# Patient Record
Sex: Male | Born: 1940 | Race: White | Hispanic: No | Marital: Married | Smoking: Former smoker
Health system: Southern US, Community
[De-identification: ages and names within clinical notes are randomized; demographics above are authoritative.]

## PROBLEM LIST (undated history)

## (undated) DIAGNOSIS — Z9989 Dependence on other enabling machines and devices: Secondary | ICD-10-CM

## (undated) DIAGNOSIS — I2510 Atherosclerotic heart disease of native coronary artery without angina pectoris: Secondary | ICD-10-CM

## (undated) DIAGNOSIS — D649 Anemia, unspecified: Secondary | ICD-10-CM

## (undated) DIAGNOSIS — C259 Malignant neoplasm of pancreas, unspecified: Secondary | ICD-10-CM

## (undated) DIAGNOSIS — I25110 Atherosclerotic heart disease of native coronary artery with unstable angina pectoris: Secondary | ICD-10-CM

## (undated) DIAGNOSIS — N400 Enlarged prostate without lower urinary tract symptoms: Secondary | ICD-10-CM

## (undated) DIAGNOSIS — R079 Chest pain, unspecified: Secondary | ICD-10-CM

## (undated) DIAGNOSIS — I509 Heart failure, unspecified: Secondary | ICD-10-CM

## (undated) DIAGNOSIS — I4891 Unspecified atrial fibrillation: Secondary | ICD-10-CM

## (undated) DIAGNOSIS — I10 Essential (primary) hypertension: Secondary | ICD-10-CM

## (undated) DIAGNOSIS — M1990 Unspecified osteoarthritis, unspecified site: Secondary | ICD-10-CM

## (undated) DIAGNOSIS — E785 Hyperlipidemia, unspecified: Secondary | ICD-10-CM

## (undated) DIAGNOSIS — F419 Anxiety disorder, unspecified: Secondary | ICD-10-CM

## (undated) DIAGNOSIS — M069 Rheumatoid arthritis, unspecified: Secondary | ICD-10-CM

## (undated) DIAGNOSIS — Z9289 Personal history of other medical treatment: Secondary | ICD-10-CM

## (undated) DIAGNOSIS — R739 Hyperglycemia, unspecified: Secondary | ICD-10-CM

## (undated) DIAGNOSIS — Z950 Presence of cardiac pacemaker: Secondary | ICD-10-CM

## (undated) DIAGNOSIS — K219 Gastro-esophageal reflux disease without esophagitis: Secondary | ICD-10-CM

## (undated) DIAGNOSIS — F32A Depression, unspecified: Secondary | ICD-10-CM

## (undated) DIAGNOSIS — I442 Atrioventricular block, complete: Secondary | ICD-10-CM

## (undated) DIAGNOSIS — C419 Malignant neoplasm of bone and articular cartilage, unspecified: Secondary | ICD-10-CM

## (undated) DIAGNOSIS — E119 Type 2 diabetes mellitus without complications: Secondary | ICD-10-CM

## (undated) DIAGNOSIS — G4733 Obstructive sleep apnea (adult) (pediatric): Secondary | ICD-10-CM

## (undated) DIAGNOSIS — E039 Hypothyroidism, unspecified: Secondary | ICD-10-CM

## (undated) DIAGNOSIS — B351 Tinea unguium: Secondary | ICD-10-CM

## (undated) DIAGNOSIS — K37 Unspecified appendicitis: Secondary | ICD-10-CM

## (undated) DIAGNOSIS — Z87442 Personal history of urinary calculi: Secondary | ICD-10-CM

## (undated) HISTORY — DX: Tinea unguium: B35.1

## (undated) HISTORY — PX: CATARACT EXTRACTION W/ INTRAOCULAR LENS  IMPLANT, BILATERAL: SHX1307

## (undated) HISTORY — PX: LUMBAR DISC SURGERY: SHX700

## (undated) HISTORY — DX: Atherosclerotic heart disease of native coronary artery with unstable angina pectoris: I25.110

## (undated) HISTORY — PX: CYSTOSCOPY W/ STONE MANIPULATION: SHX1427

## (undated) HISTORY — PX: JOINT REPLACEMENT: SHX530

## (undated) HISTORY — DX: Malignant neoplasm of bone and articular cartilage, unspecified: C41.9

## (undated) HISTORY — DX: Presence of cardiac pacemaker: Z95.0

## (undated) HISTORY — PX: TRANSURETHRAL RESECTION OF PROSTATE: SHX73

## (undated) HISTORY — DX: Depression, unspecified: F32.A

## (undated) HISTORY — DX: Unspecified atrial fibrillation: I48.91

## (undated) HISTORY — DX: Obstructive sleep apnea (adult) (pediatric): G47.33

## (undated) HISTORY — DX: Anemia, unspecified: D64.9

## (undated) HISTORY — PX: PROSTATE SURGERY: SHX751

## (undated) HISTORY — DX: Chest pain, unspecified: R07.9

## (undated) HISTORY — DX: Atrioventricular block, complete: I44.2

## (undated) HISTORY — PX: LITHOTRIPSY: SUR834

## (undated) HISTORY — DX: Unspecified appendicitis: K37

## (undated) HISTORY — PX: BACK SURGERY: SHX140

## (undated) HISTORY — DX: Atherosclerotic heart disease of native coronary artery without angina pectoris: I25.10

## (undated) HISTORY — DX: Hyperglycemia, unspecified: R73.9

## (undated) HISTORY — PX: LAPAROSCOPIC CHOLECYSTECTOMY: SUR755

## (undated) HISTORY — DX: Hyperlipidemia, unspecified: E78.5

## (undated) HISTORY — PX: REPLACEMENT TOTAL KNEE: SUR1224

## (undated) HISTORY — PX: INGUINAL HERNIA REPAIR: SUR1180

## (undated) HISTORY — DX: Anxiety disorder, unspecified: F41.9

## (undated) HISTORY — DX: Unspecified osteoarthritis, unspecified site: M19.90

## (undated) HISTORY — DX: Malignant neoplasm of pancreas, unspecified: C25.9

## (undated) HISTORY — DX: Hypothyroidism, unspecified: E03.9

## (undated) HISTORY — DX: Gastro-esophageal reflux disease without esophagitis: K21.9

---

## 1966-05-26 DIAGNOSIS — Z9289 Personal history of other medical treatment: Secondary | ICD-10-CM

## 1966-05-26 HISTORY — DX: Personal history of other medical treatment: Z92.89

## 2002-02-25 ENCOUNTER — Encounter: Payer: Self-pay | Admitting: Neurosurgery

## 2002-02-25 ENCOUNTER — Inpatient Hospital Stay (HOSPITAL_COMMUNITY): Admission: RE | Admit: 2002-02-25 | Discharge: 2002-03-01 | Payer: Self-pay | Admitting: Neurosurgery

## 2002-06-22 ENCOUNTER — Encounter: Payer: Self-pay | Admitting: Orthopaedic Surgery

## 2002-06-23 ENCOUNTER — Inpatient Hospital Stay (HOSPITAL_COMMUNITY): Admission: RE | Admit: 2002-06-23 | Discharge: 2002-06-27 | Payer: Self-pay | Admitting: Orthopaedic Surgery

## 2002-06-24 ENCOUNTER — Encounter: Payer: Self-pay | Admitting: Orthopaedic Surgery

## 2003-12-04 ENCOUNTER — Other Ambulatory Visit: Payer: Self-pay

## 2005-03-13 ENCOUNTER — Emergency Department: Payer: Self-pay | Admitting: Emergency Medicine

## 2005-03-15 ENCOUNTER — Emergency Department: Payer: Self-pay | Admitting: General Practice

## 2006-10-27 ENCOUNTER — Ambulatory Visit: Payer: Self-pay | Admitting: Gastroenterology

## 2006-12-29 ENCOUNTER — Ambulatory Visit: Payer: Self-pay | Admitting: Internal Medicine

## 2006-12-31 ENCOUNTER — Ambulatory Visit: Payer: Self-pay | Admitting: General Surgery

## 2011-12-09 ENCOUNTER — Emergency Department: Payer: Self-pay | Admitting: Emergency Medicine

## 2011-12-09 ENCOUNTER — Encounter (HOSPITAL_COMMUNITY): Payer: Self-pay | Admitting: Cardiology

## 2011-12-09 ENCOUNTER — Inpatient Hospital Stay (HOSPITAL_COMMUNITY): Payer: Medicare Other

## 2011-12-09 ENCOUNTER — Encounter (HOSPITAL_COMMUNITY)
Admission: AD | Disposition: A | Discharge status: Home / Self Care | Payer: Self-pay | Source: Other Acute Inpatient Hospital | Attending: Cardiovascular Disease

## 2011-12-09 ENCOUNTER — Inpatient Hospital Stay (HOSPITAL_COMMUNITY)
Admission: AD | Admit: 2011-12-09 | Discharge: 2011-12-11 | DRG: 244 | Disposition: A | Discharge status: Home / Self Care | Payer: Medicare Other | Source: Other Acute Inpatient Hospital | Attending: Cardiovascular Disease | Admitting: Cardiovascular Disease

## 2011-12-09 DIAGNOSIS — I442 Atrioventricular block, complete: Principal | ICD-10-CM | POA: Diagnosis present

## 2011-12-09 DIAGNOSIS — Z950 Presence of cardiac pacemaker: Secondary | ICD-10-CM

## 2011-12-09 DIAGNOSIS — I10 Essential (primary) hypertension: Secondary | ICD-10-CM | POA: Diagnosis present

## 2011-12-09 DIAGNOSIS — E039 Hypothyroidism, unspecified: Secondary | ICD-10-CM | POA: Diagnosis present

## 2011-12-09 DIAGNOSIS — M069 Rheumatoid arthritis, unspecified: Secondary | ICD-10-CM | POA: Diagnosis present

## 2011-12-09 DIAGNOSIS — Z23 Encounter for immunization: Secondary | ICD-10-CM

## 2011-12-09 DIAGNOSIS — I059 Rheumatic mitral valve disease, unspecified: Secondary | ICD-10-CM

## 2011-12-09 DIAGNOSIS — R7309 Other abnormal glucose: Secondary | ICD-10-CM | POA: Diagnosis present

## 2011-12-09 DIAGNOSIS — Z79899 Other long term (current) drug therapy: Secondary | ICD-10-CM

## 2011-12-09 DIAGNOSIS — Z7982 Long term (current) use of aspirin: Secondary | ICD-10-CM

## 2011-12-09 HISTORY — PX: INSERT / REPLACE / REMOVE PACEMAKER: SUR710

## 2011-12-09 HISTORY — PX: PERMANENT PACEMAKER INSERTION: SHX5480

## 2011-12-09 HISTORY — DX: Hypothyroidism, unspecified: E03.9

## 2011-12-09 HISTORY — DX: Essential (primary) hypertension: I10

## 2011-12-09 HISTORY — DX: Presence of cardiac pacemaker: Z95.0

## 2011-12-09 HISTORY — DX: Benign prostatic hyperplasia without lower urinary tract symptoms: N40.0

## 2011-12-09 LAB — COMPREHENSIVE METABOLIC PANEL
Albumin: 3.5 g/dL (ref 3.4–5.0)
Alkaline Phosphatase: 67 U/L (ref 50–136)
Anion Gap: 10 (ref 7–16)
BUN: 31 mg/dL — ABNORMAL HIGH (ref 7–18)
Bilirubin,Total: 0.7 mg/dL (ref 0.2–1.0)
Calcium, Total: 8.8 mg/dL (ref 8.5–10.1)
Chloride: 101 mmol/L (ref 98–107)
Co2: 25 mmol/L (ref 21–32)
Creatinine: 1.25 mg/dL (ref 0.60–1.30)
EGFR (African American): >60
EGFR (Non-African Amer.): 58 — ABNORMAL LOW
Glucose: 242 mg/dL — ABNORMAL HIGH (ref 65–99)
Osmolality: 286 (ref 275–301)
Potassium: 4.2 mmol/L (ref 3.5–5.1)
SGOT(AST): 22 U/L (ref 15–37)
SGPT (ALT): 28 U/L
Sodium: 136 mmol/L (ref 136–145)
Total Protein: 7.1 g/dL (ref 6.4–8.2)

## 2011-12-09 LAB — URINALYSIS, COMPLETE
Bacteria: NONE SEEN
Bilirubin,UR: NEGATIVE
Blood: NEGATIVE
Glucose,UR: NEGATIVE mg/dL (ref 0–75)
Hyaline Cast: 9
Leukocyte Esterase: NEGATIVE
Nitrite: NEGATIVE
Ph: 6 (ref 4.5–8.0)
Protein: 100
RBC,UR: 2 /HPF (ref 0–5)
Specific Gravity: 1.019 (ref 1.003–1.030)
Squamous Epithelial: <1
WBC UR: 1 /HPF (ref 0–5)

## 2011-12-09 LAB — CBC
HCT: 48.1 % (ref 40.0–52.0)
HGB: 16.1 g/dL (ref 13.0–18.0)
MCH: 32.3 pg (ref 26.0–34.0)
MCHC: 33.6 g/dL (ref 32.0–36.0)
MCV: 96 fL (ref 80–100)
Platelet: 174 10*3/uL (ref 150–440)
RBC: 5 10*6/uL (ref 4.40–5.90)
RDW: 14.6 % — ABNORMAL HIGH (ref 11.5–14.5)
WBC: 11.4 10*3/uL — ABNORMAL HIGH (ref 3.8–10.6)

## 2011-12-09 LAB — DRUG SCREEN, URINE

## 2011-12-09 LAB — MAGNESIUM: Magnesium: 1.8 mg/dL

## 2011-12-09 LAB — MRSA PCR SCREENING: MRSA by PCR: POSITIVE — AB

## 2011-12-09 LAB — TROPONIN I: Troponin-I: <0.02 ng/mL

## 2011-12-09 LAB — APTT: Activated PTT: 27.4 secs (ref 23.6–35.9)

## 2011-12-09 LAB — CK TOTAL AND CKMB (NOT AT ARMC)
CK, Total: 37 U/L (ref 35–232)
CK-MB: 0.8 ng/mL (ref 0.5–3.6)

## 2011-12-09 LAB — PROTIME-INR
INR: 1
Prothrombin Time: 13.6 secs (ref 11.5–14.7)

## 2011-12-09 LAB — PHOSPHORUS: Phosphorus: 3 mg/dL (ref 2.5–4.9)

## 2011-12-09 SURGERY — PERMANENT PACEMAKER INSERTION
Anesthesia: LOCAL

## 2011-12-09 MED ORDER — MUPIROCIN 2 % EX OINT: 1.0000 "application " | TOPICAL_OINTMENT | Freq: Two times a day (BID) | CUTANEOUS | Status: DC

## 2011-12-09 MED ORDER — SODIUM CHLORIDE 0.45 % IV SOLN: INTRAVENOUS | Status: DC

## 2011-12-09 MED ORDER — CEFAZOLIN SODIUM 1-5 GM-% IV SOLN
INTRAVENOUS | Status: AC
  Administered 2011-12-10: 1 g via INTRAVENOUS
  Filled 2011-12-09: qty 100

## 2011-12-09 MED ORDER — SODIUM CHLORIDE 0.9 % IR SOLN
80.0000 mg | Status: DC
  Filled 2011-12-09: qty 2

## 2011-12-09 MED ORDER — GENTAMICIN SULFATE 40 MG/ML IJ SOLN
80.0000 mg | INTRAMUSCULAR | Status: DC
  Filled 2011-12-09: qty 2

## 2011-12-09 MED ORDER — SODIUM CHLORIDE 0.9 % IV SOLN: 250.0000 mL | INTRAVENOUS | Status: DC | PRN

## 2011-12-09 MED ORDER — ACETAMINOPHEN 325 MG PO TABS
325.0000 mg | ORAL_TABLET | ORAL | Status: DC | PRN
  Administered 2011-12-09 – 2011-12-10 (×2): 650 mg via ORAL
  Filled 2011-12-09 (×2): qty 2

## 2011-12-09 MED ORDER — CHLORHEXIDINE GLUCONATE CLOTH 2 % EX PADS
6.0000 | MEDICATED_PAD | Freq: Every day | CUTANEOUS | Status: DC
  Administered 2011-12-10: 6 via TOPICAL
  Filled 2011-12-09: qty 6

## 2011-12-09 MED ORDER — CHLORHEXIDINE GLUCONATE 4 % EX LIQD: 60.0000 mL | Freq: Once | CUTANEOUS | Status: DC

## 2011-12-09 MED ORDER — SODIUM CHLORIDE 0.9 % IJ SOLN: 3.0000 mL | Freq: Two times a day (BID) | INTRAMUSCULAR | Status: DC

## 2011-12-09 MED ORDER — PREDNISONE 10 MG PO TABS
10.0000 mg | ORAL_TABLET | Freq: Every day | ORAL | Status: DC
  Administered 2011-12-09 – 2011-12-11 (×3): 10 mg via ORAL
  Filled 2011-12-09 (×3): qty 1

## 2011-12-09 MED ORDER — MUPIROCIN 2 % EX OINT
1.0000 "application " | TOPICAL_OINTMENT | Freq: Two times a day (BID) | CUTANEOUS | Status: DC
  Administered 2011-12-09 – 2011-12-11 (×4): 1 via NASAL
  Filled 2011-12-09 (×2): qty 22

## 2011-12-09 MED ORDER — CHLORHEXIDINE GLUCONATE CLOTH 2 % EX PADS: 6.0000 | MEDICATED_PAD | Freq: Every day | CUTANEOUS | Status: DC

## 2011-12-09 MED ORDER — ASPIRIN EC 81 MG PO TBEC
81.0000 mg | DELAYED_RELEASE_TABLET | Freq: Every day | ORAL | Status: DC
  Administered 2011-12-10: 81 mg via ORAL
  Filled 2011-12-09: qty 1

## 2011-12-09 MED ORDER — TAMSULOSIN HCL 0.4 MG PO CAPS
0.4000 mg | ORAL_CAPSULE | Freq: Every day | ORAL | Status: DC
  Administered 2011-12-10 – 2011-12-11 (×2): 0.4 mg via ORAL
  Filled 2011-12-09 (×2): qty 1

## 2011-12-09 MED ORDER — HEPARIN (PORCINE) IN NACL 2-0.9 UNIT/ML-% IJ SOLN
INTRAMUSCULAR | Status: AC
  Filled 2011-12-09: qty 1000

## 2011-12-09 MED ORDER — SODIUM CHLORIDE 0.9 % IJ SOLN: 3.0000 mL | INTRAMUSCULAR | Status: DC | PRN

## 2011-12-09 MED ORDER — CEFAZOLIN SODIUM 1-5 GM-% IV SOLN
1.0000 g | Freq: Four times a day (QID) | INTRAVENOUS | Status: AC
  Administered 2011-12-09 – 2011-12-10 (×3): 1 g via INTRAVENOUS
  Filled 2011-12-09 (×3): qty 50

## 2011-12-09 MED ORDER — ACETAMINOPHEN 325 MG PO TABS: 650.0000 mg | ORAL_TABLET | ORAL | Status: DC | PRN

## 2011-12-09 MED ORDER — SODIUM CHLORIDE 0.9 % IJ SOLN
3.0000 mL | Freq: Two times a day (BID) | INTRAMUSCULAR | Status: DC
  Administered 2011-12-09 – 2011-12-11 (×4): 3 mL via INTRAVENOUS

## 2011-12-09 MED ORDER — CEFAZOLIN SODIUM-DEXTROSE 2-3 GM-% IV SOLR
2.0000 g | INTRAVENOUS | Status: DC
  Filled 2011-12-09: qty 50

## 2011-12-09 MED ORDER — ONDANSETRON HCL 4 MG/2ML IJ SOLN: 4.0000 mg | Freq: Four times a day (QID) | INTRAMUSCULAR | Status: DC | PRN

## 2011-12-09 MED ORDER — LEVOTHYROXINE SODIUM 125 MCG PO TABS
125.0000 ug | ORAL_TABLET | Freq: Every day | ORAL | Status: DC
  Administered 2011-12-10 – 2011-12-11 (×2): 125 ug via ORAL
  Filled 2011-12-09 (×5): qty 1

## 2011-12-09 MED ORDER — LIDOCAINE HCL (PF) 1 % IJ SOLN
INTRAMUSCULAR | Status: AC
  Filled 2011-12-09: qty 60

## 2011-12-09 MED ORDER — PNEUMOCOCCAL VAC POLYVALENT 25 MCG/0.5ML IJ INJ
0.5000 mL | INJECTION | INTRAMUSCULAR | Status: AC
  Administered 2011-12-10: 0.5 mL via INTRAMUSCULAR
  Filled 2011-12-09: qty 0.5

## 2011-12-09 MED ORDER — SODIUM CHLORIDE 0.9 % IV SOLN: 250.0000 mL | INTRAVENOUS | Status: DC

## 2011-12-09 MED ORDER — HYDROCODONE-ACETAMINOPHEN 5-325 MG PO TABS: 1.0000 | ORAL_TABLET | ORAL | Status: DC | PRN

## 2011-12-09 MED FILL — Dopamine HCl Inj 40 MG/ML: INTRAVENOUS | Qty: 10 | Status: AC

## 2011-12-09 NOTE — Progress Notes (Signed)
Pt. and family informed (+) MRSA result.  Informed of PPE protocol.  Information packet given to pt.  Infection Prevention notified.

## 2011-12-09 NOTE — Progress Notes (Signed)
  Echocardiogram 2D Echocardiogram has been performed.  Mauricio Po 12/09/2011, 4:24 PM

## 2011-12-09 NOTE — Care Management Note (Signed)
    Page 1 of 1   12/09/2011     3:49:09 PM   CARE MANAGEMENT NOTE 12/09/2011  Patient:  Cameron [name], Cameron [name]   Account Number:  [id number]  Date Initiated:  12/09/2011  Documentation initiated by:  Elissa Hefty  Subjective/Objective Assessment:   adm w complete heart block     Action/Plan:   lives alone   Anticipated DC Date:     Anticipated DC Plan:        DC Planning Services  CM consult      Choice offered to / List presented to:             Status of service:   Medicare Important Message given?   (If response is "NO", the following Medicare IM given date fields will be blank) Date Medicare IM given:   Date Additional Medicare IM given:    Discharge Disposition:    Per UR Regulation:  Reviewed for med. necessity/level of care/duration of stay  If discussed at [place] of Stay Meetings, dates discussed:    Comments:  7/16 15:48p debbie Ivon Roedel rn,bsn [phone]

## 2011-12-09 NOTE — H&P (Signed)
ELECTROPHYSIOLOGY ADMISSION HISTORY & PHYSICAL  [name] MRN: [id number], DOB/AGE: [birth year]   Date of Admission: 12/09/2011  Primary Physician: Doy Hutching, [place], [place] Primary Cardiologist: New to [facility] Reason for Admission: Complete heart block  History of Present Illness Cameron [name] is a pleasant 71 year old gentleman with HTN, "borderline" DM, rheumatoid arthritis and hypothyroidism who has been accepted in transfer from [facility] for PPM. Cameron [name] was pumping gas this AM when he felt "flushed" followed by abrupt LOC. This was witnessed by several bystanders, one of whom was a Surveyor, quantity, who reported he did not have a pulse. EMS was activated and according to their report Mr. Shellhammer was found to have profound bradycardia with heart rate in the 30s initially. He then experienced pauses with asystole requiring CPR. He was then admitted to [facility] and started on IV dopamine infusion. He was seen in consultation by Dr. Ida Rogue who recommended placement of a temporary transvenous pacing wire for hemodynamic support with subsequent transfer here for PPM implantation. Cameron [name] denies chest pain or anginal-like symptoms and has no history of CAD/MI. He denies SOB, palpitations or prior history of syncope. He denies history of valvular heart disease or CHF. He denies LE swelling, orthopnea or PND. He was recently diagnosed with a URI for which his PCP started cefdinir and prednisone taper; otherwise, [name] denies any recent changes to his medications. Currently, he has no complaints and is V paced at 80 bpm.  Past Medical History 1. Hypertension 2. "Borderline" DM, patient reports his PCP is following this closely recently 3. Rheumatoid arthritis 4. Recent URI  5. Hypothyroidism   Allergies/Intolerances No known drug allergies  Home Medications Aspirin 81 mg PO daily Bystolic 5 mg PO daily Methotrexate 2.5 mg - 6 tablets once weekly on Wed Prednisone 10  mg PO as directed - currently on 10-day taper Synthroid 125 mcg PO daily Flomax 0.4 mg PO daily Cefdinir 300 mg PO twice daily x 10 days Gelnique 3% transdermal gel 3 pumps once daily as directed Spectravite OTC multivitamin daily Vitamin C daily  Family History Positive for CAD   Social History Widowed. Lives alone and performs ADLs without difficulty or limitation. Denies tobacco use. Denies illicit drug use. Drinks alcohol occasionally.   Review of Systems General: No chills, fever, night sweats or weight changes.  Cardiovascular: No chest pain, dyspnea on exertion, edema, orthopnea, palpitations, paroxysmal nocturnal dyspnea. Dermatological: No rash, lesions or masses. Respiratory: No cough, dyspnea. Urologic: No hematuria, dysuria. Abdominal: No nausea, vomiting, diarrhea, bright red blood per rectum, melena, or hematemesis. Neurologic: Positive for syncope. No visual changes, difficult ambulation or movement. No slurred speech. No seizures. All other systems reviewed and are otherwise negative except as noted above.  Physical Exam Blood pressure 121/85, temperature 97.7 F (36.5 C), temperature source Oral, resp. rate 26, height _0  (1.727 m), weight 200 lb 6.4 oz (90.9 kg).  General: Well developed, well appearing 71 year old male in no acute distress. He is lying comfortably with temporary pacing wire in place. HEENT: Normocephalic. Abrasion on forehead with bandage in place. EOMs intact. Sclera nonicteric. Oropharynx clear.  Neck: Supple without bruits. No JVD. Lungs: Respirations regular and unlabored, CTA bilaterally. No wheezes, rales or rhonchi. Heart: RRR. S1, S2 present. No murmurs, rub, S3 or S4. Abdomen: Soft, non-tender, non-distended. BS present x 4 quadrants. No hepatosplenomegaly.  Extremities: No clubbing, cyanosis or edema. DP/PT/Radials 2+ and equal bilaterally. Psych: Normal affect. Neuro:  Alert and oriented X 3. Moves all extremities spontaneously.     Labs/Radiology/Studies From [facility] today - WBC 11,400, Hgb 16, Hct 48, PLT 174,000 Sodium 136, potassium 4.2, chloride 101, bicarbonate 25, BUN 31, creatinine 1.25, glucose 242, AST 22, ALT 28, alkaline phosphatase 67 Magnesium 1.8 CK 37, CK-MB 0.8, troponin <0.02 INR 1.0 Urine drug screen negative Head CT - no acute abnormality per report from [facility] Cervical spine CT - no acute abnormality per report from [facility] Telemetry strips reviewed which shows high grade AV block, most consistent with complete heart block  Echocardiogram  Currently in progress at bedside  12-lead ECG None available from [place] currently V paced at 80 bpm  Assessment and Plan 1. Complete heart block  [name] has symptomatic high grade AV block and is now s/p temporary transvenous pacing wire placement at [facility]. He will be admitted to the CCU with routine labs obtained, including thyroid studies. Dopamine infusion will be down-titrated to off. He is currently on low dose Bystolic, which is not new, and is not on any other AV nodal blocking agents. He will need dual chamber PPM implantation. Risks, benefits and alternatives to PPM implantation were discussed in detail with the patient and his family today. These risks include, but are not limited to, bleeding, infection, pneumothorax, perforation, tamponade, vascular damage, renal failure, lead dislodgement, MI, stroke and death. The patient expressed verbal understanding and agrees to proceed with dual chamber PPM implantation.    The patient was seen, examined and plan of care formulated with Dr. [name]. Signed, Ileene Hutchinson, PA-C 12/09/2011, 4:28 PM  I have seen, examined the patient, and reviewed the above assessment and plan.  Changes to above are made where necessary.    The patient has symptomatic complete heart block with syncope.  He has required temporary pacing.  He previously was on a very low dose of bystolic but doesn't recall last  dose.  I do not think that this is the cause for his AV block.  He is on no other AV nodal agents. I would therefore recommend pacemaker implantation at this time.  Risks, benefits, alternatives to pacemaker implantation were discussed in detail with the patient today. The patient understands that the risks include but are not limited to bleeding, infection, pneumothorax, perforation, tamponade, vascular damage, renal failure, MI, stroke, death,  and lead dislodgement and wishes to proceed. We will therefore proceed with PPM implant at this time.  Co Sign: [name], MD 12/09/2011 4:40 PM

## 2011-12-09 NOTE — Op Note (Signed)
SURGEON:  [name], MD     PREPROCEDURE DIAGNOSIS:  Complete heart block and syncope    POSTPROCEDURE DIAGNOSIS:  Complete heart block and syncope     PROCEDURES:   1.  Pacemaker implantation.     INTRODUCTION: Cameron [name] is a 71 y.o. male  with a history of complete heart block and syncope who presents today for pacemaker implantation.  The patient reports abrupt syncope today.  He was found to have complete heart block with a very slow escape rhythm.  He required emergent temporary pacing.  The patient therefore presents today for pacemaker implantation.     DESCRIPTION OF PROCEDURE:  Informed written consent was obtained, and the patient was brought to the electrophysiology lab in a fasting state.  The patient required no sedation for the procedure today.  The patients left chest was prepped and draped in the usual sterile fashion by the EP lab staff. The skin overlying the left deltopectoral region was infiltrated with lidocaine for local analgesia.  A 4-cm incision was made over the left deltopectoral region.  A left subcutaneous pacemaker pocket was fashioned using a combination of sharp and blunt dissection. Electrocautery was required to assure hemostasis.    RA/RV Lead Placement: The left axillary vein was cannulated with fluoroscopic visualization.  Through the left axillary vein, a [facility] model [phone] (serial number  [id number]) right atrial lead and a Hector- 51 (serial number [id number]) right ventricular lead were advanced with fluoroscopic visualization into the right atrial appendage and right ventricular apex positions respectively.   Initial atrial lead P- waves measured 4 mV with impedance of 526 ohms and a threshold of 0.8 V at 0.5 msec.  Right ventricular lead R-waves (paced) measured 30 mV with an impedance of 786 ohms and a threshold of 0.4 V at 0.5 msec.  Both leads were secured to the pectoralis fascia using #2-0 silk over the suture  sleeves.    Device Placement:  The leads were then connected to a [place] DR RF model [id number] (serial number  [id number] ) pacemaker.  The pocket was irrigated with copious gentamicin solution.  The pacemaker was then placed into the pocket.  The pocket was then closed in 2 layers with 2.0 Vicryl suture for the subcutaneous and subcuticular layers.  Steri- Strips and a sterile dressing were then applied.  There were no early apparent complications.  No contrast was required for the procedure today.  The temporary pacing catheter previously placed from the right IJ by Dr Rockey Situ was carefully removed under fluoroscopic visualization without difficulty.    CONCLUSIONS:   1. Successful implantation of a [employer], [employer] DR RF dual-chamber pacemaker for symptomatic bradycardia  2. No early apparent complications.           [name], MD 12/09/2011 5:57 PM

## 2011-12-10 ENCOUNTER — Encounter (HOSPITAL_COMMUNITY): Payer: Self-pay | Admitting: *Deleted

## 2011-12-10 ENCOUNTER — Inpatient Hospital Stay (HOSPITAL_COMMUNITY): Payer: Medicare Other

## 2011-12-10 LAB — BASIC METABOLIC PANEL
BUN: 26 mg/dL — ABNORMAL HIGH (ref 6–23)
CO2: 25 mEq/L (ref 19–32)
Calcium: 8.9 mg/dL (ref 8.4–10.5)
Chloride: 97 mEq/L (ref 96–112)
Creatinine, Ser: 0.94 mg/dL (ref 0.50–1.35)
GFR calc Af Amer: >90 mL/min (ref >90)
GFR calc non Af Amer: 82 mL/min — ABNORMAL LOW (ref >90)
Glucose, Bld: 183 mg/dL — ABNORMAL HIGH (ref 70–99)
Potassium: 4 mEq/L (ref 3.5–5.1)
Sodium: 133 mEq/L — ABNORMAL LOW (ref 135–145)

## 2011-12-10 NOTE — Progress Notes (Signed)
Ambulated pt in 300 ft in hallway, pt without complaints

## 2011-12-10 NOTE — Progress Notes (Signed)
SUBJECTIVE: The patient is doing well today.  At this time, he denies chest pain, shortness of breath, or any new concerns.     Marland Kitchen aspirin EC  81 mg Oral Daily  .  ceFAZolin (ANCEF) IV  1 g Intravenous Q6H  . Chlorhexidine Gluconate Cloth  6 each Topical Q0600  . heparin      . levothyroxine  125 mcg Oral QAC breakfast  . lidocaine      . mupirocin ointment  1 application Nasal BID  . pneumococcal 23 valent vaccine  0.5 mL Intramuscular Tomorrow-1000  . predniSONE  10 mg Oral Daily  . sodium chloride  3 mL Intravenous Q12H  . Tamsulosin HCl  0.4 mg Oral Daily  . DISCONTD:  ceFAZolin (ANCEF) IV  2 g Intravenous On Call  . DISCONTD: chlorhexidine  60 mL Topical Once  . DISCONTD: chlorhexidine  60 mL Topical Once  . DISCONTD: Chlorhexidine Gluconate Cloth  6 each Topical Q0600  . DISCONTD: gentamicin irrigation  80 mg Irrigation On Call  . DISCONTD: gentamicin irrigation  80 mg Irrigation To Cath  . DISCONTD: mupirocin ointment  1 application Nasal BID  . DISCONTD: sodium chloride  3 mL Intravenous Q12H      . DISCONTD: sodium chloride    . DISCONTD: sodium chloride      OBJECTIVE: Physical Exam: Filed Vitals:   12/10/11 0400 12/10/11 0500 12/10/11 0600 12/10/11 0717  BP: 119/62 115/56 115/60   Pulse: 59 59 59 59  Temp: 97.2 F (36.2 C)     TempSrc: Oral     Resp: _0 Height:      Weight: 197 lb 12 oz (89.7 kg)     SpO2: 94% 94% 95% 95%    Intake/Output Summary (Last 24 hours) at 12/10/11 0800 Last data filed at 12/10/11 0600  Gross per 24 hour  Intake    880 ml  Output   2525 ml  Net  -1645 ml    Telemetry reveals sinus rhythm  GEN- The patient is well appearing, alert and oriented x 3 today.   Head- normocephalic, atraumatic Eyes-  Sclera clear, conjunctiva pink Ears- hearing intact Oropharynx- clear Neck- supple, no JVP Lymph- no cervical lymphadenopathy Lungs- Clear to ausculation bilaterally, normal work of breathing Heart- Regular rate  and rhythm, no murmurs, rubs or gallops, PMI not laterally displaced GI- soft, NT, ND, + BS Extremities- no clubbing, cyanosis, or edema Skin- pacemaker pocket without hematoma  CXR- no ptx, stable lead position  LABS: Basic Metabolic Panel:  Basename 12/10/11 0520  NA 133*  K 4.0  CL 97  CO2 25  GLUCOSE 183*  BUN 26*  CREATININE 0.94  CALCIUM 8.9  MG --  PHOS --   RADIOLOGY: Dg Chest Port 1 View  12/09/2011  *RADIOLOGY REPORT*  Clinical Data: Post pacemaker insertion.  PORTABLE CHEST - 1 VIEW  Comparison: None.  Findings: Sequential pacemaker enters from the left.  The battery pack and ventricular lead are not completely included on present examination.  No gross pneumothorax or segmental infiltrate.  Heart size top normal.  Mild central pulmonary vascular prominence without frank pulmonary edema.  Peribronchial thickening.  The patient would eventually benefit from follow-up two-view chest with cardiac leads removed.  IMPRESSION: Sequential pacemaker enters from the left.  The battery pack and ventricular lead are not completely included on present examination.  No gross pneumothorax or segmental infiltrate.  Heart size top normal.  Mild central pulmonary vascular  prominence without frank pulmonary edema.  Peribronchial thickening.  Original Report Authenticated By: Doug Sou, M.D.    ASSESSMENT AND PLAN:  Principal Problem:  *Complete heart block Active Problems:  Hypertension  Rheumatoid arthritis  1. CHB- remains in complete heart block Normal pacemaker function Pacemaker interrogation is reviewed and normal  Transfer to telemetry I anticipate discharge to home tomorrow am   [name], MD 12/10/2011 8:00 AM

## 2011-12-10 NOTE — Progress Notes (Signed)
Inpatient Diabetes Program Recommendations  AACE/ADA: New Consensus Statement on Inpatient Glycemic Control  Target Ranges:  Prepandial:   less than 140 mg/dL      Peak postprandial:   less than 180 mg/dL (1-2 hours)      Critically ill patients:  140 - 180 mg/dL  Pager:  [phone] Hours:  8 am-10pm   Reason for Visit: Elevated lab glucose:  183 mg/dL  Inpatient Diabetes Program Recommendations Correction (SSI): Add Novolog Correction HgbA1C: Check HgbA1C to assess glycemic control    Courtney Heys PhD, RN Diabetes Coordinator  Office:  [phone] Team Pager:  [phone]

## 2011-12-11 NOTE — Progress Notes (Signed)
D/c orders received;IV removed with gauze on, pt remains in stable condition, pt meds and instructions reviewed and given to pt; pacemaker d/c instructions reviewed; pt d/c to home

## 2011-12-11 NOTE — Discharge Summary (Signed)
ELECTROPHYSIOLOGY DISCHARGE SUMMARY    [name],  MRN: [id number], DOB/AGE: [birth year] 71 y.o.  Admit date: 12/09/2011 Discharge date: 12/11/2011  Primary Care Physician: Doy Hutching, MD in [place], [place] Primary Cardiologist: [name], MD  Primary Discharge Diagnosis:  1. Complete heart block with syncope and slow escape rhythm s/p PPM implantation  Secondary Discharge Diagnoses:  1. HTN 2. "Borderline" DM 3. Rheumatoid arthritis 4. Hypothyroidism  Procedures This Admission:   1. Dual chamber PPM implantation 12/09/2011 [facility] model [phone] (serial number [id number]) right atrial lead. [place] (serial number [id number]) right ventricular lead. [address] [employer] [address] RF model [id number] (serial number [id number] ) pacemaker.  History and Hospital Course:  Cameron [name] is a 71 y.o. male with HTN, "borderline" DM, hypothyroidism and rheumatoid arthritis who presented 12/09/2011 to [facility] with complete heart block and syncope. He was then transferred to [facility] for pacemaker implantation. He was found to have persistent complete heart block with a very slow escape rhythm. He required emergent temporary pacing. He underwent PPM implantation 12/09/2011. The patient tolerated this procedure well without any immediate complication. He was observed >48 hours as he is PPM dependent. He remains hemodynamically stable and afebrile. He is ambulating without difficulty. His chest xray shows stable lead placement without pneumothorax. His device interrogation shows normal PPM function with stable lead parameters/measurements. His implant site is intact without significant bleeding or hematoma. He has been given discharge instructions including wound care and activity restrictions. He will follow-up in 10 days for wound check. There were no changes made to his medications. He has been seen, examined and deemed stable for discharge today by Dr. Cristopher Peru.  Physical Exam: Vitals: Blood pressure 159/86, pulse 61, temperature 98.2 F (36.8 C), temperature source Oral, resp. rate 16, height 5' 8" (1.727 m), weight 197 lb 12 oz (89.7 kg), SpO2 95.00%.  General: Well developed, well appearing 71 year old male in no acute distress. HEENT: Normocephalic, well healing abrasion on left forehead and left temple. EOMs intact. Sclera nonicteric. Oropharynx clear. Heart: RRR. S1, S2 present without murmur, rub or S3. Lungs: CTA bilaterally. No wheezes, rales or rhonchi. Extremities: No cyanosis, clubbing or edema. Skin: Left upper chest/implant site intact without significant bleeding or hematoma.  Labs:    Lab 12/10/11 0520  NA 133*  K 4.0  CL 97  CO2 25  BUN 26*  CREATININE 0.94  CALCIUM 8.9  PROT --  BILITOT --  ALKPHOS --  ALT --  AST --  GLUCOSE 183*    Disposition:  The patient is being discharged in stable condition.  Follow-up: Follow-up Information    Follow up with [place] on 12/22/2011. (At 3:30 PM for wound check)    Contact information:   [address] [place] [phone]    Follow up with [name], MD on 03/18/2012. (At 9:15 AM)    Contact information:   [address], [place] [phone]   Discharge Medications:  Medication List  As of 12/11/2011  8:38 AM   TAKE these medications         aspirin EC 81 MG tablet   Take 81 mg by mouth daily.      cefdinir 300 MG capsule   Commonly known as: OMNICEF   Take 300 mg by mouth 2 (two) times daily. X 10 days. Started on 12/04/11  Co Q 10 100 MG Caps   Take 100 mg by mouth daily.      [facility] SPECTRAVITE PO   Take 1 tablet by mouth daily.      levothyroxine 125 MCG tablet   Commonly known as: SYNTHROID, LEVOTHROID   Take 125 mcg by mouth daily.      methotrexate 2.5 MG tablet   Commonly known as: RHEUMATREX   Take 10 mg by mouth once a week. On Tuesday.  Caution:Chemotherapy. Protect from light.      nebivolol 5 MG tablet   Commonly known as: BYSTOLIC   Take 5 mg by mouth daily.      predniSONE 10 MG tablet   Commonly known as: DELTASONE   Take 10 mg by mouth daily. Last dose is to be given today      SUPER OMEGA 3 PO   Take 1 capsule by mouth daily.      Tamsulosin HCl 0.4 MG Caps   Commonly known as: FLOMAX   Take 0.4 mg by mouth daily.      vitamin C 1000 MG tablet   Take 1,000 mg by mouth daily.      Duration of Discharge Encounter: Greater than 30 minutes including physician time.  Manson Passey, PA-C 12/11/2011, 8:38 AM  Cardiology Attending  Patient seen and examined. [place] for discharge with usual followup.   Cristopher Peru, M.D.

## 2011-12-15 ENCOUNTER — Telehealth: Payer: Self-pay | Admitting: Internal Medicine

## 2011-12-15 NOTE — Telephone Encounter (Signed)
New msg Pt just had pacemaker put in and wanted to talk to you about his fishing trip he had scheduled to go to [place]. He said Dr [name] told him he couldn't go and he is having trouble with airline

## 2011-12-15 NOTE — Telephone Encounter (Signed)
I previously told him that I would not recommend that he rigorously move his arm with fishing.  He would be 4 weeks s/p pacemaker at that point. If he wishes to have some one help him with his fishing then he can go. Otherwise,  We can happily write the letter for him.

## 2011-12-15 NOTE — Telephone Encounter (Signed)
Called patient and spoke with him.  He is suppose to leave 01/11/12 and they need a letter from Dr [name] stating why he should not go on the trip to get airfare reimbursed.

## 2011-12-16 NOTE — Telephone Encounter (Signed)
He wants letter.  He says you advised him not to go.  He is not going and the airline is giving him a hard time getting money back.  Letter needs to state his condition and why you feel he should not go

## 2011-12-22 ENCOUNTER — Encounter: Payer: Self-pay | Admitting: Internal Medicine

## 2011-12-22 ENCOUNTER — Encounter: Payer: Self-pay | Admitting: *Deleted

## 2011-12-22 ENCOUNTER — Ambulatory Visit (INDEPENDENT_AMBULATORY_CARE_PROVIDER_SITE_OTHER): Payer: Medicare Other | Admitting: *Deleted

## 2011-12-22 DIAGNOSIS — I442 Atrioventricular block, complete: Secondary | ICD-10-CM

## 2011-12-22 LAB — PACEMAKER DEVICE OBSERVATION
AL AMPLITUDE: 4.4 mv
AL IMPEDENCE PM: 400 Ohm
AL THRESHOLD: 0.5 V
ATRIAL PACING PM: 96
BAMS-0001: 150 {beats}/min
BAMS-0003: 70 {beats}/min
BATTERY VOLTAGE: 2.9478 V
DEVICE MODEL PM: 7356500
RV LEAD IMPEDENCE PM: 650 Ohm
RV LEAD THRESHOLD: 0.5 V
VENTRICULAR PACING PM: 100

## 2011-12-22 NOTE — Progress Notes (Signed)
Wound check-PPM

## 2011-12-23 NOTE — Telephone Encounter (Signed)
Letter done and mailed to patient

## 2011-12-30 ENCOUNTER — Telehealth: Payer: Self-pay | Admitting: Internal Medicine

## 2011-12-30 NOTE — Telephone Encounter (Signed)
Called patient back and spoke with him and let him know I would call and speak with Dr Doy Hutching nurse Olivia Mackie and have her get him an appointment to follow up on his blood pressure issues.  I had to leave her a message.  I have let the patient know that they should be calling him to get him in to have this looked at.  The reading he had this morning was prior to medications After taking medication his BP was 176/91.  He is going to call he back if he does not hear from the doctor about getting an appointment

## 2011-12-30 NOTE — Telephone Encounter (Signed)
New msg Pt said since coming home his BP is 186/94. He had pacemaker put in 3 wks ago. He wants to talk to a nurse

## 2011-12-31 NOTE — Telephone Encounter (Signed)
Cathy from Dr Doy Hutching office called patient was seen today and started on Lisinopril 34m daily with a follow up in 2 weeks

## 2012-01-21 ENCOUNTER — Telehealth: Payer: Self-pay | Admitting: Internal Medicine

## 2012-01-21 NOTE — Telephone Encounter (Signed)
Please return call to patient at cell# [phone] regarding care after procedure

## 2012-01-21 NOTE — Telephone Encounter (Signed)
Wants to know if he can lift weights up 20-25 lbs.  Wants to know if its ok to run tiller in garden is okay.  I have tried to call Dr [name] but could not reach him.  I let him know after I discuss with Dr [name] I will call him back

## 2012-01-21 NOTE — Telephone Encounter (Signed)
lmom for patient to call me back.

## 2012-01-28 NOTE — Telephone Encounter (Signed)
Discussed with Dr Charlett Blake to do all the things we discussed just wait on using the tiller until follow up

## 2012-03-10 ENCOUNTER — Encounter: Payer: Self-pay | Admitting: *Deleted

## 2012-03-10 DIAGNOSIS — Z950 Presence of cardiac pacemaker: Secondary | ICD-10-CM

## 2012-03-10 HISTORY — DX: Presence of cardiac pacemaker: Z95.0

## 2012-03-17 ENCOUNTER — Encounter: Payer: Self-pay | Admitting: Internal Medicine

## 2012-03-18 ENCOUNTER — Ambulatory Visit (INDEPENDENT_AMBULATORY_CARE_PROVIDER_SITE_OTHER): Payer: Medicare Other | Admitting: Internal Medicine

## 2012-03-18 ENCOUNTER — Encounter: Payer: Self-pay | Admitting: Internal Medicine

## 2012-03-18 VITALS — BP 134/78 | HR 71 | Ht 68.0 in | Wt 197.0 lb

## 2012-03-18 DIAGNOSIS — Z950 Presence of cardiac pacemaker: Secondary | ICD-10-CM

## 2012-03-18 DIAGNOSIS — I442 Atrioventricular block, complete: Secondary | ICD-10-CM

## 2012-03-18 LAB — PACEMAKER DEVICE OBSERVATION
AL AMPLITUDE: 3.1 mv
AL IMPEDENCE PM: 400 Ohm
AL THRESHOLD: 0.625 V
ATRIAL PACING PM: 95
BAMS-0001: 150 {beats}/min
BAMS-0003: 70 {beats}/min
BATTERY VOLTAGE: 2.93 V
DEVICE MODEL PM: 7356500
RV LEAD IMPEDENCE PM: 650 Ohm
RV LEAD THRESHOLD: 0.625 V
VENTRICULAR PACING PM: 99

## 2012-03-18 NOTE — Patient Instructions (Signed)
Your physician wants you to follow-up in: July 2014 with Dr [name].  You will receive a reminder letter in the mail two months in advance. If you don't receive a letter, please call our office to schedule the follow-up appointment.

## 2012-03-18 NOTE — Progress Notes (Signed)
PCP: [name], MD  Cameron [name] is a 71 y.o. male who presents today for routine electrophysiology followup.  Since his pacemaker was implanted, the patient reports doing very well.   He reports mild postural dizziness.  He has resumed normal activity.  Today, he denies symptoms of palpitations, chest pain, shortness of breath,  lower extremity edema, presyncope, or syncope.  The patient is otherwise without complaint today.   Past Medical History  Diagnosis Date  . Rheumatoid arthritis   . Hypertension   . Hypothyroidism   . BPH (benign prostatic hypertrophy)   . Complete heart block     s/p PPM   Past Surgical History  Procedure Date  . Pacemaker insertion 12/09/11    SJM Accent DR RF implanted by DR Raeshawn Vo for complete heart block and syncope    Current Outpatient Prescriptions  Medication Sig Dispense Refill  . Ascorbic Acid (VITAMIN C) 1000 MG tablet Take 1,000 mg by mouth daily.      Marland Kitchen aspirin EC 81 MG tablet Take 81 mg by mouth daily.      . Coenzyme Q10 (CO Q 10) 100 MG CAPS Take 100 mg by mouth daily.      Marland Kitchen levothyroxine (SYNTHROID, LEVOTHROID) 125 MCG tablet Take 125 mcg by mouth daily.      . Multiple Vitamins-Minerals ([employer]S SPECTRAVITE PO) Take 1 tablet by mouth daily.      . nebivolol (BYSTOLIC) 5 MG tablet Take 5 mg by mouth daily.      . Omega-3 Fatty Acids (SUPER OMEGA 3 PO) Take 1 capsule by mouth daily.      . Tamsulosin HCl (FLOMAX) 0.4 MG CAPS Take 0.4 mg by mouth daily.        Physical Exam: Filed Vitals:   03/18/12 0916  BP: 134/78  Pulse: 71  Height: _0  (1.727 m)  Weight: 197 lb (89.359 kg)  SpO2: 93%    GEN- The patient is well appearing, alert and oriented x 3 today.   Head- normocephalic, atraumatic Eyes-  Sclera clear, conjunctiva pink Ears- hearing intact Oropharynx- clear Lungs- Clear to ausculation bilaterally, normal work of breathing Chest- pacemaker pocket is well healed Heart- Regular rate and rhythm, no murmurs, rubs or  gallops, PMI not laterally displaced GI- soft, NT, ND, + BS Extremities- no clubbing, cyanosis, or edema  Pacemaker interrogation- reviewed in detail today,  See PACEART report  Assessment and Plan:  1. Complete heart block Normal pacemaker function See Pace Art report No changes today  2. Syncope Resolved s/p PPM

## 2012-04-13 ENCOUNTER — Ambulatory Visit: Payer: Self-pay | Admitting: Gastroenterology

## 2012-05-06 ENCOUNTER — Ambulatory Visit: Payer: Self-pay | Admitting: Surgery

## 2012-05-10 ENCOUNTER — Telehealth: Payer: Self-pay | Admitting: Internal Medicine

## 2012-05-10 NOTE — Telephone Encounter (Signed)
New Problem:    Patient called in wanting to know if he needed to get his hernia surgeries scheduled for 05/13/12 cleared with [name].  Please call back.

## 2012-05-10 NOTE — Telephone Encounter (Signed)
F/U   Dr. [name]- hernia surgery on 12/19 ? Time.

## 2012-05-10 NOTE — Telephone Encounter (Signed)
I have lmom for patient to call me back with the name of his surgeon and the practice.  Also let me know when he is planning on doing the surgery

## 2012-05-11 NOTE — Telephone Encounter (Signed)
lmom for patient to call me back with the name of practice that Dr [name] is with

## 2012-05-11 NOTE — Telephone Encounter (Signed)
F/U   Returning call back to nurse with information   [facility] in [place] - Dr.  Rochel Brome office # [phone].  [address] in [place]. [address].

## 2012-05-11 NOTE — Telephone Encounter (Signed)
F/u   Patient returning nurse call he can be reached at [phone]

## 2012-05-13 ENCOUNTER — Ambulatory Visit: Payer: Self-pay | Admitting: Surgery

## 2012-05-13 NOTE — Telephone Encounter (Signed)
Fax number [phone]

## 2012-05-13 NOTE — Telephone Encounter (Signed)
Proceed with surgery if medically necessary.

## 2012-06-18 DIAGNOSIS — N401 Enlarged prostate with lower urinary tract symptoms: Secondary | ICD-10-CM | POA: Insufficient documentation

## 2012-06-18 DIAGNOSIS — N3941 Urge incontinence: Secondary | ICD-10-CM | POA: Insufficient documentation

## 2012-06-18 DIAGNOSIS — N529 Male erectile dysfunction, unspecified: Secondary | ICD-10-CM | POA: Insufficient documentation

## 2012-08-23 ENCOUNTER — Emergency Department: Payer: Self-pay | Admitting: Emergency Medicine

## 2012-08-25 ENCOUNTER — Emergency Department: Payer: Self-pay | Admitting: Internal Medicine

## 2012-09-06 ENCOUNTER — Emergency Department: Payer: Self-pay | Admitting: Emergency Medicine

## 2012-11-24 ENCOUNTER — Other Ambulatory Visit: Payer: Self-pay | Admitting: Internal Medicine

## 2012-11-24 ENCOUNTER — Encounter: Payer: Self-pay | Admitting: Internal Medicine

## 2012-11-24 ENCOUNTER — Ambulatory Visit (INDEPENDENT_AMBULATORY_CARE_PROVIDER_SITE_OTHER): Payer: Medicare Other | Admitting: Internal Medicine

## 2012-11-24 VITALS — BP 128/58 | HR 64 | Ht 68.0 in | Wt 185.6 lb

## 2012-11-24 DIAGNOSIS — I442 Atrioventricular block, complete: Secondary | ICD-10-CM

## 2012-11-24 DIAGNOSIS — Z950 Presence of cardiac pacemaker: Secondary | ICD-10-CM

## 2012-11-24 DIAGNOSIS — I10 Essential (primary) hypertension: Secondary | ICD-10-CM

## 2012-11-24 MED ORDER — NITROGLYCERIN 0.4 MG SL SUBL: 0.4000 mg | SUBLINGUAL_TABLET | SUBLINGUAL | Status: DC | PRN

## 2012-11-24 NOTE — Patient Instructions (Addendum)
Remote monitoring is used to monitor your Pacemaker of ICD from home. This monitoring reduces the number of office visits required to check your device to one time per year. It allows [place] to keep an eye on the functioning of your device to ensure it is working properly. You are scheduled for a device check from home on February 28, 2013. You may send your transmission at any time that day. If you have a wireless device, the transmission will be sent automatically. After your physician reviews your transmission, you will receive a postcard with your next transmission date.  Your physician wants you to follow-up in: 1 year with Cameron [name].  You will receive a reminder letter in the mail two months in advance. If you don't receive a letter, please call our office to schedule the follow-up appointment.

## 2012-11-24 NOTE — Progress Notes (Addendum)
PCP: [name], MD  Cameron [name] is a 72 y.o. male who presents today for routine electrophysiology followup.  Since last being seen in our clinic, the patient reports doing very well.  He has had problems with arthritis recently. He was on Methotrexate previously for his arthritis, but stopped it due to concerns about side effects.  He reports that he has had no other problems with dizziness or syncope. They are planning a 2 month trip to [place].    He had brief chest discomfort several weeks ago which was not exertional.  He feels that his exercise capacity is preserved.  Today, he denies symptoms of palpitations, chest pain, shortness of breath,  lower extremity edema, dizziness, presyncope, or syncope.  The patient is otherwise without complaint today.   Past Medical History  Diagnosis Date  . Rheumatoid arthritis(714.0)   . Hypertension   . Hypothyroidism   . BPH (benign prostatic hypertrophy)   . Complete heart block     s/p PPM   Past Surgical History  Procedure Laterality Date  . Pacemaker insertion  12/09/11    SJM Accent DR RF implanted by DR Allred for complete heart block and syncope    Current Outpatient Prescriptions  Medication Sig Dispense Refill  . Ascorbic Acid (VITAMIN C) 1000 MG tablet Take 1,000 mg by mouth daily.      Marland Kitchen aspirin EC 81 MG tablet Take 81 mg by mouth daily.      . Coenzyme Q10 (CO Q 10) 100 MG CAPS Take 100 mg by mouth daily.      Marland Kitchen levothyroxine (SYNTHROID, LEVOTHROID) 125 MCG tablet Take 125 mcg by mouth daily.      . Multiple Vitamins-Minerals (CVS SPECTRAVITE PO) Take 1 tablet by mouth daily.      . nebivolol (BYSTOLIC) 5 MG tablet Take 5 mg by mouth daily.      . Omega-3 Fatty Acids (SUPER OMEGA 3 PO) Take 1 capsule by mouth daily.      . Tamsulosin HCl (FLOMAX) 0.4 MG CAPS Take 0.4 mg by mouth daily.       No current facility-administered medications for this visit.    Physical Exam: Filed Vitals:   11/24/12 1203  BP: 128/58   Pulse: 64  Height: 5' 8" (1.727 m)  Weight: 185 lb 9.6 oz (84.188 kg)    GEN- The patient is well appearing, alert and oriented x 3 today.   Head- normocephalic, atraumatic Eyes-  Sclera clear, conjunctiva pink Ears- hearing intact Oropharynx- clear Lungs- Clear to ausculation bilaterally, normal work of breathing Chest- pacemaker pocket is well healed Heart- Regular rate and rhythm, no murmurs, rubs or gallops, PMI not laterally displaced GI- soft, NT, ND, + BS Extremities- no clubbing, cyanosis, or edema  Pacemaker interrogation- reviewed in detail today,  See PACEART report  Assessment and Plan:  1. Complete heart block Normal pacemaker function See Pace Art report No changes today  2. Chest discomfort Atypical If he develops recurrent symptoms then we will consider stress testing slNTG on hand  3. HTN Stable No change required today  Return in 1 year merlin   [name], MD

## 2012-12-04 LAB — PACEMAKER DEVICE OBSERVATION
AL AMPLITUDE: 1.7 mv
AL IMPEDENCE PM: 360 Ohm
AL THRESHOLD: 0.875 V
ATRIAL PACING PM: 97
BAMS-0001: 150 {beats}/min
BAMS-0003: 70 {beats}/min
BATTERY VOLTAGE: 2.93 V
DEVICE MODEL PM: 7356500
RV LEAD IMPEDENCE PM: 590 Ohm
RV LEAD THRESHOLD: 0.625 V
VENTRICULAR PACING PM: 99

## 2012-12-10 ENCOUNTER — Encounter: Payer: Self-pay | Admitting: Internal Medicine

## 2012-12-23 ENCOUNTER — Encounter: Payer: Medicare Other | Admitting: Internal Medicine

## 2013-02-28 ENCOUNTER — Ambulatory Visit (INDEPENDENT_AMBULATORY_CARE_PROVIDER_SITE_OTHER): Payer: Medicare Other | Admitting: *Deleted

## 2013-02-28 DIAGNOSIS — Z950 Presence of cardiac pacemaker: Secondary | ICD-10-CM

## 2013-02-28 DIAGNOSIS — I442 Atrioventricular block, complete: Secondary | ICD-10-CM

## 2013-03-01 LAB — REMOTE PACEMAKER DEVICE
AL AMPLITUDE: 2.3 mv
AL IMPEDENCE PM: 400 Ohm
AL THRESHOLD: 0.625 V
ATRIAL PACING PM: 96
BAMS-0001: 150 {beats}/min
BAMS-0003: 70 {beats}/min
BATTERY VOLTAGE: 2.93 V
DEVICE MODEL PM: 7356500
RV LEAD IMPEDENCE PM: 700 Ohm
RV LEAD THRESHOLD: 0.75 V
VENTRICULAR PACING PM: 100

## 2013-03-16 ENCOUNTER — Encounter: Payer: Self-pay | Admitting: *Deleted

## 2013-04-01 ENCOUNTER — Encounter: Payer: Self-pay | Admitting: Internal Medicine

## 2013-06-02 ENCOUNTER — Ambulatory Visit (INDEPENDENT_AMBULATORY_CARE_PROVIDER_SITE_OTHER): Payer: Medicare Other | Admitting: *Deleted

## 2013-06-02 ENCOUNTER — Telehealth: Payer: Self-pay | Admitting: Internal Medicine

## 2013-06-02 DIAGNOSIS — I442 Atrioventricular block, complete: Secondary | ICD-10-CM

## 2013-06-02 NOTE — Telephone Encounter (Signed)
New problem:  Pt's fiance', [employer], states they are having trouble with their land line and according to [place] their device hasn't been sending transmissions since 05/22/13... She would like a call back to let them know what to do for his remote transmission.

## 2013-06-02 NOTE — Telephone Encounter (Signed)
Spoke with patient.  They are having [employer] repair the phone line in the next few days and will send the transmission as soon as the repair is done.

## 2013-06-06 ENCOUNTER — Telehealth: Payer: Self-pay | Admitting: Internal Medicine

## 2013-06-06 NOTE — Telephone Encounter (Signed)
Pt calling to get results from transmission, left message on my voicemail, can be reached at home [phone] or cell [phone]/mt

## 2013-06-08 LAB — MDC_IDC_ENUM_SESS_TYPE_REMOTE
Battery Voltage: 2.93 V
Brady Statistic RA Percent Paced: 96 %
Brady Statistic RV Percent Paced: 99 %
Implantable Pulse Generator Model: 2210
Implantable Pulse Generator Serial Number: 7356500
Lead Channel Impedance Value: 400 Ohm
Lead Channel Impedance Value: 600 Ohm
Lead Channel Pacing Threshold Amplitude: 0.625 V
Lead Channel Pacing Threshold Amplitude: 0.625 V
Lead Channel Pacing Threshold Pulse Width: 0.5 ms
Lead Channel Pacing Threshold Pulse Width: 0.5 ms
Lead Channel Sensing Intrinsic Amplitude: 2.3 mV
Lead Channel Setting Pacing Amplitude: 0.75 V
Lead Channel Setting Pacing Amplitude: 1.5 V
Lead Channel Setting Pacing Pulse Width: 0.5 ms
Lead Channel Setting Sensing Sensitivity: 4 mV

## 2013-06-08 NOTE — Telephone Encounter (Signed)
Transmission received. Report shows known mode switches (short A-tach lasting less than 8sec & <1% of time). I let pt know report looks good with no new concerns. If any concerns arise, we'll contact him. Otherwise, he'll receive a letter w/ his next appt. Pt was thankful.

## 2013-06-20 ENCOUNTER — Encounter: Payer: Self-pay | Admitting: *Deleted

## 2013-06-22 ENCOUNTER — Encounter: Payer: Self-pay | Admitting: Internal Medicine

## 2013-09-09 ENCOUNTER — Ambulatory Visit (INDEPENDENT_AMBULATORY_CARE_PROVIDER_SITE_OTHER): Payer: Medicare Other | Admitting: *Deleted

## 2013-09-09 DIAGNOSIS — I442 Atrioventricular block, complete: Secondary | ICD-10-CM

## 2013-09-12 LAB — MDC_IDC_ENUM_SESS_TYPE_REMOTE
Battery Remaining Longevity: 78 mo
Battery Voltage: 2.93 V
Brady Statistic AP VP Percent: 97 %
Brady Statistic AP VS Percent: 1 %
Brady Statistic AS VP Percent: 3.5 %
Brady Statistic AS VS Percent: 1 %
Brady Statistic RA Percent Paced: 96 %
Brady Statistic RV Percent Paced: 99 %
Date Time Interrogation Session: 20150417070434
Implantable Pulse Generator Model: 2210
Implantable Pulse Generator Serial Number: 7356500
Lead Channel Impedance Value: 380 Ohm
Lead Channel Impedance Value: 600 Ohm
Lead Channel Pacing Threshold Amplitude: 0.625 V
Lead Channel Pacing Threshold Amplitude: 0.625 V
Lead Channel Pacing Threshold Pulse Width: 0.5 ms
Lead Channel Pacing Threshold Pulse Width: 0.5 ms
Lead Channel Sensing Intrinsic Amplitude: 12 mV
Lead Channel Sensing Intrinsic Amplitude: 3 mV
Lead Channel Setting Pacing Amplitude: 0.875
Lead Channel Setting Pacing Amplitude: 1.625
Lead Channel Setting Pacing Pulse Width: 0.5 ms
Lead Channel Setting Sensing Sensitivity: 4 mV

## 2013-09-20 ENCOUNTER — Encounter: Payer: Self-pay | Admitting: *Deleted

## 2013-09-22 ENCOUNTER — Encounter: Payer: Self-pay | Admitting: Internal Medicine

## 2013-11-05 DIAGNOSIS — R739 Hyperglycemia, unspecified: Secondary | ICD-10-CM

## 2013-11-05 DIAGNOSIS — E785 Hyperlipidemia, unspecified: Secondary | ICD-10-CM

## 2013-11-05 DIAGNOSIS — M1990 Unspecified osteoarthritis, unspecified site: Secondary | ICD-10-CM | POA: Insufficient documentation

## 2013-11-05 DIAGNOSIS — E039 Hypothyroidism, unspecified: Secondary | ICD-10-CM | POA: Insufficient documentation

## 2013-11-05 HISTORY — DX: Hypothyroidism, unspecified: E03.9

## 2013-11-05 HISTORY — DX: Unspecified osteoarthritis, unspecified site: M19.90

## 2013-11-05 HISTORY — DX: Hyperglycemia, unspecified: R73.9

## 2013-11-05 HISTORY — DX: Hyperlipidemia, unspecified: E78.5

## 2013-12-12 ENCOUNTER — Ambulatory Visit (INDEPENDENT_AMBULATORY_CARE_PROVIDER_SITE_OTHER): Payer: Medicare Other | Admitting: Internal Medicine

## 2013-12-12 ENCOUNTER — Encounter: Payer: Self-pay | Admitting: Internal Medicine

## 2013-12-12 VITALS — BP 143/74 | HR 73 | Ht 68.0 in | Wt 186.8 lb

## 2013-12-12 DIAGNOSIS — I442 Atrioventricular block, complete: Secondary | ICD-10-CM

## 2013-12-12 DIAGNOSIS — I10 Essential (primary) hypertension: Secondary | ICD-10-CM

## 2013-12-12 DIAGNOSIS — I480 Paroxysmal atrial fibrillation: Secondary | ICD-10-CM

## 2013-12-12 DIAGNOSIS — I4891 Unspecified atrial fibrillation: Secondary | ICD-10-CM

## 2013-12-12 DIAGNOSIS — Z950 Presence of cardiac pacemaker: Secondary | ICD-10-CM

## 2013-12-12 HISTORY — DX: Unspecified atrial fibrillation: I48.91

## 2013-12-12 LAB — MDC_IDC_ENUM_SESS_TYPE_INCLINIC
Battery Remaining Longevity: 88.8 mo
Battery Voltage: 2.93 V
Brady Statistic RA Percent Paced: 97 %
Brady Statistic RV Percent Paced: 99.97 %
Date Time Interrogation Session: 20150720163457
Implantable Pulse Generator Model: 2210
Implantable Pulse Generator Serial Number: 7356500
Lead Channel Impedance Value: 375 Ohm
Lead Channel Impedance Value: 662.5 Ohm
Lead Channel Pacing Threshold Amplitude: 0.625 V
Lead Channel Pacing Threshold Amplitude: 0.625 V
Lead Channel Pacing Threshold Pulse Width: 0.5 ms
Lead Channel Pacing Threshold Pulse Width: 0.5 ms
Lead Channel Sensing Intrinsic Amplitude: 12 mV
Lead Channel Sensing Intrinsic Amplitude: 2.4 mV
Lead Channel Setting Pacing Amplitude: 0.875
Lead Channel Setting Pacing Amplitude: 1.625
Lead Channel Setting Pacing Pulse Width: 0.5 ms
Lead Channel Setting Sensing Sensitivity: 4 mV

## 2013-12-12 NOTE — Progress Notes (Signed)
PCP: [name], MD  Cameron [name] is a 73 y.o. male who presents today for routine electrophysiology followup.  He is doing very well.  He was married in April and is enjoying his new marriage.  He exercises 3 times per week without difficulty.  Today, he denies symptoms of palpitations, chest pain, shortness of breath,  lower extremity edema, presyncope, or syncope.  The patient is otherwise without complaint today.   Past Medical History  Diagnosis Date  . Rheumatoid arthritis(714.0)   . Hypertension   . Hypothyroidism   . BPH (benign prostatic hypertrophy)   . Complete heart block     s/p PPM   Past Surgical History  Procedure Laterality Date  . Pacemaker insertion  12/09/11    SJM Accent DR RF implanted by DR Kyrsten Deleeuw for complete heart block and syncope    Current Outpatient Prescriptions  Medication Sig Dispense Refill  . Ascorbic Acid (VITAMIN C) 1000 MG tablet Take 1,000 mg by mouth daily.      Marland Kitchen aspirin EC 81 MG tablet Take 81 mg by mouth daily.      . Coenzyme Q10 (CO Q 10) 100 MG CAPS Take 100 mg by mouth daily.      Marland Kitchen levothyroxine (SYNTHROID, LEVOTHROID) 125 MCG tablet Take 125 mcg by mouth daily.      . Multiple Vitamins-Minerals (CVS SPECTRAVITE PO) Take 1 tablet by mouth daily.      . nebivolol (BYSTOLIC) 5 MG tablet Take 5 mg by mouth daily.      . nitroGLYCERIN (NITROSTAT) 0.4 MG SL tablet Place 1 tablet (0.4 mg total) under the tongue every 5 (five) minutes as needed for chest pain.  90 tablet  3  . Omega-3 Fatty Acids (SUPER OMEGA 3 PO) Take 1 capsule by mouth daily.      . Oxybutynin 3 (28) % (MG/ACT) GEL Place 1 application onto the skin daily.      . Tamsulosin HCl (FLOMAX) 0.4 MG CAPS Take 0.4 mg by mouth daily.       No current facility-administered medications for this visit.    Physical Exam: Filed Vitals:   12/12/13 1228  BP: 143/74  Pulse: 73  Height: _0  (1.727 m)  Weight: 186 lb 12.8 oz (84.732 kg)    GEN- The patient is well  appearing, alert and oriented x 3 today.   Head- normocephalic, atraumatic Eyes-  Sclera clear, conjunctiva pink Ears- hearing intact Oropharynx- clear Lungs- Clear to ausculation bilaterally, normal work of breathing Chest- pacemaker pocket is well healed Heart- Regular rate and rhythm, no murmurs, rubs or gallops, PMI not laterally displaced GI- soft, NT, ND, + BS Extremities- no clubbing, cyanosis, or edema  Pacemaker interrogation- reviewed in detail today,  See PACEART report  Assessment and Plan:  1. Complete heart block Normal pacemaker function See Pace Art report No changes today  2. HTN Regular exericise is encouraged No changes today  3. nonsustained atach/afib Will need to follow burden of atrial arrhythmias.  If his burden increases then anticoagulation may be considered  4. Mild mitral regurgitation Consider repeating echo upon return  Merlin compliant Return to see me in 1 year

## 2013-12-12 NOTE — Patient Instructions (Signed)
Remote monitoring is used to monitor your pacemaker from home. This monitoring reduces the number of office visits required to check your device to one time per year. It allows [place] to keep an eye on the functioning of your device to ensure it is working properly. You are scheduled for a device check from home on 03-13-2014. You may send your transmission at any time that day. If you have a wireless device, the transmission will be sent automatically. After your physician reviews your transmission, you will receive a postcard with your next transmission date.  Your physician recommends that you schedule a follow-up appointment in: 12 months with Dr.Allred

## 2013-12-22 ENCOUNTER — Other Ambulatory Visit: Payer: Self-pay | Admitting: Internal Medicine

## 2014-01-19 ENCOUNTER — Encounter: Payer: Self-pay | Admitting: Internal Medicine

## 2014-03-15 ENCOUNTER — Encounter: Payer: Self-pay | Admitting: Internal Medicine

## 2014-03-15 ENCOUNTER — Ambulatory Visit (INDEPENDENT_AMBULATORY_CARE_PROVIDER_SITE_OTHER): Payer: Medicare Other | Admitting: *Deleted

## 2014-03-15 DIAGNOSIS — I442 Atrioventricular block, complete: Secondary | ICD-10-CM

## 2014-03-15 LAB — MDC_IDC_ENUM_SESS_TYPE_REMOTE
Battery Remaining Longevity: 79 mo
Battery Remaining Percentage: 67 %
Battery Voltage: 2.93 V
Brady Statistic AP VP Percent: 99 %
Brady Statistic AP VS Percent: 1 %
Brady Statistic AS VP Percent: 1.4 %
Brady Statistic AS VS Percent: 1 %
Brady Statistic RA Percent Paced: 99 %
Brady Statistic RV Percent Paced: 99 %
Date Time Interrogation Session: 20151021080932
Implantable Pulse Generator Model: 2210
Implantable Pulse Generator Serial Number: 7356500
Lead Channel Impedance Value: 380 Ohm
Lead Channel Impedance Value: 640 Ohm
Lead Channel Pacing Threshold Amplitude: 0.625 V
Lead Channel Pacing Threshold Amplitude: 0.875 V
Lead Channel Pacing Threshold Pulse Width: 0.5 ms
Lead Channel Pacing Threshold Pulse Width: 0.5 ms
Lead Channel Sensing Intrinsic Amplitude: 1.1 mV
Lead Channel Sensing Intrinsic Amplitude: 12 mV
Lead Channel Setting Pacing Amplitude: 1.125
Lead Channel Setting Pacing Amplitude: 1.625
Lead Channel Setting Pacing Pulse Width: 0.5 ms
Lead Channel Setting Sensing Sensitivity: 4 mV

## 2014-03-15 NOTE — Progress Notes (Signed)
Remote pacemaker transmission.   

## 2014-03-29 ENCOUNTER — Encounter: Payer: Self-pay | Admitting: Cardiology

## 2014-05-04 ENCOUNTER — Encounter (HOSPITAL_COMMUNITY): Payer: Self-pay | Admitting: Internal Medicine

## 2014-06-19 ENCOUNTER — Ambulatory Visit (INDEPENDENT_AMBULATORY_CARE_PROVIDER_SITE_OTHER): Payer: Medicare Other | Admitting: *Deleted

## 2014-06-19 ENCOUNTER — Encounter: Payer: Self-pay | Admitting: Internal Medicine

## 2014-06-19 ENCOUNTER — Telehealth: Payer: Self-pay | Admitting: Internal Medicine

## 2014-06-19 DIAGNOSIS — I442 Atrioventricular block, complete: Secondary | ICD-10-CM

## 2014-06-19 LAB — MDC_IDC_ENUM_SESS_TYPE_REMOTE
Battery Remaining Longevity: 79 mo
Battery Remaining Percentage: 67 %
Battery Voltage: 2.93 V
Brady Statistic AP VP Percent: 98 %
Brady Statistic AP VS Percent: 1 %
Brady Statistic AS VP Percent: 2.2 %
Brady Statistic AS VS Percent: 1 %
Brady Statistic RA Percent Paced: 98 %
Brady Statistic RV Percent Paced: 99 %
Date Time Interrogation Session: 20160125084300
Implantable Pulse Generator Model: 2210
Implantable Pulse Generator Serial Number: 7356500
Lead Channel Impedance Value: 360 Ohm
Lead Channel Impedance Value: 590 Ohm
Lead Channel Pacing Threshold Amplitude: 0.625 V
Lead Channel Pacing Threshold Amplitude: 0.75 V
Lead Channel Pacing Threshold Pulse Width: 0.5 ms
Lead Channel Pacing Threshold Pulse Width: 0.5 ms
Lead Channel Sensing Intrinsic Amplitude: 12 mV
Lead Channel Sensing Intrinsic Amplitude: 2.6 mV
Lead Channel Setting Pacing Amplitude: 1 V
Lead Channel Setting Pacing Amplitude: 1.625
Lead Channel Setting Pacing Pulse Width: 0.5 ms
Lead Channel Setting Sensing Sensitivity: 4 mV

## 2014-06-19 NOTE — Telephone Encounter (Signed)
F/U        Pt calling about remote transmission. Pt doesn't understand the process.     Please call pt with info.

## 2014-06-19 NOTE — Telephone Encounter (Signed)
LMOVM that remote was already received, his is automatic.   Left my direct #.

## 2014-06-19 NOTE — Progress Notes (Signed)
Remote pacemaker transmission.   

## 2014-06-19 NOTE — Telephone Encounter (Signed)
New message      Calling to have his remote device checked

## 2014-06-28 ENCOUNTER — Encounter: Payer: Self-pay | Admitting: Cardiology

## 2014-09-12 NOTE — Op Note (Signed)
PATIENT NAME:  Cameron [name], Cameron [name] MR#:  [id number] DATE OF BIRTH:  06-24-1940  DATE OF PROCEDURE:  05/13/2012  PREOPERATIVE DIAGNOSIS: Left inguinal hernia, umbilical hernia.   POSTOPERATIVE DIAGNOSIS: Left inguinal hernia, umbilical hernia.   PROCEDURE: Left inguinal hernia repair, umbilical hernia repair.   SURGEON: Rochel Brome, M.D.   ANESTHESIA: General.   INDICATIONS: This 74 year old male has had recent bulging in the left groin with some moderate discomfort and repair was recommended for definitive treatment. He also had a smaller umbilical hernia and repair of that was recommended at the same session.  DESCRIPTION OF PROCEDURE: The patient was placed on the operating table in the supine position under general anesthesia. The left lower quadrant and periumbilical areas were clipped and prepared with ChloraPrep and draped in a sterile manner. A transversely-oriented left suprapubic incision was made and carried down through subcutaneous tissues. Two traversing veins were divided between 4-0 Vicryl ligatures. The Scarpa's fascia was incised. Several small bleeding points were cauterized during the course of the procedure. The external oblique aponeurosis was incised along the course of its fibers to open the external ring and expose the inguinal cord structures. The cord structures were mobilized. There was mild weakness of the floor of the inguinal canal. Cremasteric fibers were spread to expose a large cord lipoma which was dissected free from surrounding structures up into the internal ring. A high ligation of the cord lipoma was done with a 4-0 Vicryl suture ligature, the cord lipoma was excised and the stump allowed to retract. Next, there was the finding of an indirect hernia sac which was dissected free from surrounding structures and subsequently determined that this was a sliding-type hernia, so that most of the sac was sigmoid colon. This was dissected free from surrounding structures up  well into the internal ring and was inverted down through the internal ring. Next, the repair was carried out with a row of 0 Surgilon sutures, suturing the conjoined tendon to the shelving edge of the inguinal ligament, incorporating transversalis fascia into the repair. The last stitch led to satisfactory narrowing of the internal ring. Subsequently, an Atrium mesh was cut out to create an oval shape of some 3 x 5 cm and a notch was cut out for the cord structures. This was inserted along the floor of the inguinal canal and the portion of the mesh was passed around both sides of the cord structures and sutured to the deep fascia with 0 Surgilon sutures, also was sutured to the repair site was 0 Surgilon. The repair looked good. Hemostasis was intact. The cord structures were replaced along the floor of the inguinal canal. Cut edges of the external oblique aponeurosis were closed with running 4-0 Vicryl. The deep fascia superior and lateral to the repair site was infiltrated with 0.5% Sensorcaine with epinephrine. The subcutaneous tissues were infiltrated as well for a total of 15 mL. The Scarpa's fascia was closed with interrupted 4-0 Vicryl. The skin was closed with a running 4-0 Monocryl subcuticular suture.   The patient was in stable condition and subsequently carried out the umbilical hernia repair starting with a transversely-oriented curvilinear supraumbilical incision which was approximately 3 cm in length and carried down through subcutaneous tissues to encounter an umbilical hernia sac which was dissected free from the surrounding structures down through a fascial ring defect and was inverted. The fascial ring defect was approximately 8 mm and the integrity of the fascia appeared to be satisfactory. The repair was carried out with  a transversely-oriented suture line of interrupted 0 Surgilon figure-of-eight inverted sutures. The repair looked good. The skin of the umbilicus remained tethered to the  deep fascia. Next, the wound was closed with running 4-0 Monocryl subcuticular suture.   Next, both wounds were treated with Dermabond. The patient tolerated surgery satisfactorily and was then prepared for transfer to the recovery room.    ____________________________ Lenna Sciara. Rochel Brome, MD jws:es D: 05/13/2012 18:27:49 ET T: 05/14/2012 09:01:20 ET JOB#: [id number]  cc: Loreli Dollar, MD, <Dictator> Loreli Dollar MD ELECTRONICALLY SIGNED 05/16/2012 8:08

## 2014-09-12 NOTE — Consult Note (Signed)
General Aspect 74 yo male withy PMH of arthritis, borderline diabetes, presents after syncope at a gas station in [place], noted to have heart block in the ER with escape ventricular rate in the high 20s, started on external pacing.  Cardiology was consulted for heart block.  He reports he was filling his car with gas when he acutely passed out. He woke up on the ground and noted he had hit his head. He was brought in with a C-collar, started on pacing pads. By report, BP on arrival was in the 962 range systolic. In the ER, BP was 952 systolic, pacing greater than 70 bpm.  He has been doing well until the past two weeks when he has developed chest congestion. He was started on ABX and prednisone taper with mild improvement of sx. No orthopnea or PND.  Vitals: Paced 70 bpm, Afebrile, 100/60, 16, >95% RA    Present Illness . Social: Lives alone, no smoking or ETOH  Family hx mother dies of lymphoma, father dies of multiuple myeloma.   Physical Exam:   GEN well developed, well nourished, no acute distress    HEENT red conjunctivae    NECK supple  No masses    RESP normal resp effort  clear BS    CARD Regular rate and rhythm  No murmur    ABD denies tenderness  soft    LYMPH negative neck    EXTR negative edema    SKIN normal to palpation    NEURO motor/sensory function intact    PSYCH alert, A+O to time, place, person, good insight   Review of Systems:   Subjective/Chief Complaint Syncope, hit forehead    General: Weakness    Skin: No Complaints    ENT: No Complaints    Eyes: No Complaints    Neck: No Complaints    Respiratory: recent cough, congestion    Cardiovascular: No Complaints    Gastrointestinal: No Complaints    Genitourinary: No Complaints    Vascular: No Complaints    Musculoskeletal: No Complaints    Neurologic: Dizzness  syncope    Hematologic: No Complaints    Endocrine: No Complaints    Psychiatric: No Complaints    Review  of Systems: All other systems were reviewed and found to be negative    Medications/Allergies Reviewed Medications/Allergies reviewed     urinary problem:    arthritis:    thyroid ? high or low:    Denies medical history:    knee replaement  needs antibiotics with procedures:        Admit Diagnosis:   BRANDI CARDIA: 09-Dec-2011, Active, Velna Hatchet CARDIA  Home Medications: Medication Instructions Status  predniSONE 10 mg oral tablet Take as directed as a taper dose for 6 days Active  cefdinir 300 mg oral capsule 1 cap(s) orally 2 times a day for 10 days Active  Bystolic 5 mg oral tablet 1 tab(s) orally once a day Active  Synthroid 125 mcg (0.125 mg) oral tablet 1 tab(s) orally once a day Active  tamsulosin 0.4 mg oral capsule 1 cap(s) orally once a day Active  aspirin 81 mg oral tablet 1 tab(s) orally once a day Active  Spectravite Senior oral tablet 1 tab(s) orally once a day Active  Vitamin C 1000 mg oral tablet 1 tab(s) orally once a day Active  methotrexate 2.5 mg oral tablet 6 tab(s) orally once a week on wednesdays Active  Gelnique 3% transdermal gel 3 pump(s) transdermal once a day as  directed Active   EKG:   Interpretation EKG shows NSR with complete heart block, ventricular rate 27 to 30 bpm    NKA: None  Celebrex: Unknown    Impression 74 yo male with PMH of arthritis, borderline diabetes, presents after syncope at a gas station in [place], noted to have heart block in the ER with escape ventricular rate in the high 20s, started on external pacing.  Cardiology was consulted for heart block.  1) Complete Heart Block Case disc ussed with Dr. [name] Will place Temp pacing wire in cardiac cath lab Then transfer to [facility] for pacemaker (or keep here if pacer can be placed in expedited manner)  2) HTN: stable Will hold bystolic  3) Arthritis Could continue outpt meds  4) Chest congestion: no recent EKG, though unable to exclude arrhythmia over the past few  weeks no orthopnea or PND No clinical signs of heart failure he may benefit from echo after pacer placement Consider stress test to rule out underlying ischemia as cause of heart block   Electronic Signatures: Ida Rogue (MD)  (Signed 16-Jul-13 13:04)  Authored: General Aspect/Present Illness, History and Physical Exam, Review of System, Past Medical History, Health Issues, Home Medications, EKG , Allergies, Impression/Plan   Last Updated: 16-Jul-13 13:04 by Ida Rogue (MD)

## 2014-09-19 ENCOUNTER — Ambulatory Visit (INDEPENDENT_AMBULATORY_CARE_PROVIDER_SITE_OTHER): Payer: Medicare Other | Admitting: *Deleted

## 2014-09-19 ENCOUNTER — Encounter: Payer: Self-pay | Admitting: Internal Medicine

## 2014-09-19 DIAGNOSIS — I442 Atrioventricular block, complete: Secondary | ICD-10-CM | POA: Diagnosis not present

## 2014-09-19 LAB — MDC_IDC_ENUM_SESS_TYPE_REMOTE
Battery Remaining Longevity: 78 mo
Battery Remaining Percentage: 67 %
Battery Voltage: 2.93 V
Brady Statistic AP VP Percent: 98 %
Brady Statistic AP VS Percent: 1 %
Brady Statistic AS VP Percent: 2.4 %
Brady Statistic AS VS Percent: 1 %
Brady Statistic RA Percent Paced: 97 %
Brady Statistic RV Percent Paced: 99 %
Date Time Interrogation Session: 20160426071504
Implantable Pulse Generator Model: 2210
Implantable Pulse Generator Serial Number: 7356500
Lead Channel Impedance Value: 340 Ohm
Lead Channel Impedance Value: 560 Ohm
Lead Channel Pacing Threshold Amplitude: 0.5 V
Lead Channel Pacing Threshold Amplitude: 0.625 V
Lead Channel Pacing Threshold Pulse Width: 0.5 ms
Lead Channel Pacing Threshold Pulse Width: 0.5 ms
Lead Channel Sensing Intrinsic Amplitude: 12 mV
Lead Channel Sensing Intrinsic Amplitude: 2 mV
Lead Channel Setting Pacing Amplitude: 0.875
Lead Channel Setting Pacing Amplitude: 1.5 V
Lead Channel Setting Pacing Pulse Width: 0.5 ms
Lead Channel Setting Sensing Sensitivity: 4 mV

## 2014-09-19 NOTE — Progress Notes (Signed)
Remote pacemaker transmission.

## 2014-09-26 ENCOUNTER — Encounter: Payer: Self-pay | Admitting: Cardiology

## 2014-11-23 ENCOUNTER — Telehealth: Payer: Self-pay | Admitting: *Deleted

## 2014-11-23 ENCOUNTER — Encounter: Payer: Self-pay | Admitting: *Deleted

## 2014-11-23 NOTE — Telephone Encounter (Signed)
called & spoke to the pt, updated fm hx & status.Cameron KitchenMarland [name]

## 2014-11-29 ENCOUNTER — Other Ambulatory Visit: Payer: Self-pay

## 2014-11-29 ENCOUNTER — Encounter: Payer: Self-pay | Admitting: Internal Medicine

## 2014-11-29 ENCOUNTER — Ambulatory Visit (INDEPENDENT_AMBULATORY_CARE_PROVIDER_SITE_OTHER): Payer: Medicare Other | Admitting: Internal Medicine

## 2014-11-29 VITALS — BP 136/64 | HR 76 | Ht 68.0 in | Wt 196.8 lb

## 2014-11-29 DIAGNOSIS — I480 Paroxysmal atrial fibrillation: Secondary | ICD-10-CM | POA: Diagnosis not present

## 2014-11-29 DIAGNOSIS — I10 Essential (primary) hypertension: Secondary | ICD-10-CM

## 2014-11-29 DIAGNOSIS — Z950 Presence of cardiac pacemaker: Secondary | ICD-10-CM | POA: Diagnosis not present

## 2014-11-29 DIAGNOSIS — I442 Atrioventricular block, complete: Secondary | ICD-10-CM

## 2014-11-29 LAB — CUP PACEART INCLINIC DEVICE CHECK
Battery Remaining Longevity: 90 mo
Battery Voltage: 2.93 V
Brady Statistic RA Percent Paced: 97 %
Brady Statistic RV Percent Paced: 99.97 %
Date Time Interrogation Session: 20160706163136
Lead Channel Impedance Value: 375 Ohm
Lead Channel Impedance Value: 612.5 Ohm
Lead Channel Pacing Threshold Amplitude: 0.625 V
Lead Channel Pacing Threshold Amplitude: 0.625 V
Lead Channel Pacing Threshold Pulse Width: 0.5 ms
Lead Channel Pacing Threshold Pulse Width: 0.5 ms
Lead Channel Sensing Intrinsic Amplitude: 12 mV
Lead Channel Sensing Intrinsic Amplitude: 2.6 mV
Lead Channel Setting Pacing Amplitude: 0.875
Lead Channel Setting Pacing Amplitude: 1.625
Lead Channel Setting Pacing Pulse Width: 0.5 ms
Lead Channel Setting Sensing Sensitivity: 4 mV
Pulse Gen Model: 2210
Pulse Gen Serial Number: 7356500

## 2014-11-29 NOTE — Progress Notes (Signed)
PCP: [name], MD  Cameron [name] is a 74 y.o. male who presents today for routine electrophysiology followup.  He is doing very well.  He and his new wife (married slightly longer than 1 year) have been traveling and enjoying life.  He went on a cruise to [place] and also has been out [place].  He is planning a trip to [place] soon.  Today, he denies symptoms of palpitations, chest pain, shortness of breath,  lower extremity edema, presyncope, or syncope.  The patient is otherwise without complaint today.   Past Medical History  Diagnosis Date  . Rheumatoid arthritis(714.0)   . Hypertension   . Hypothyroidism   . BPH (benign prostatic hypertrophy)   . Complete heart block     s/p PPM   Past Surgical History  Procedure Laterality Date  . Pacemaker insertion  12/09/11    SJM Accent DR RF implanted by DR Ifeoluwa Beller for complete heart block and syncope  . Permanent pacemaker insertion N/A 12/09/2011    Procedure: PERMANENT PACEMAKER INSERTION;  Surgeon: [name], MD;  Location: [facility] CATH LAB;  Service: Cardiovascular;  Laterality: N/A;    Current Outpatient Prescriptions  Medication Sig Dispense Refill  . Ascorbic Acid (VITAMIN C) 1000 MG tablet Take 1,000 mg by mouth daily.    Marland Kitchen aspirin EC 81 MG tablet Take 81 mg by mouth daily.    . Coenzyme Q10 (CO Q 10) 100 MG CAPS Take 100 mg by mouth daily.    . hydroxychloroquine (PLAQUENIL) 200 MG tablet Take 1 tablet by mouth 2 (two) times daily.    Marland Kitchen leflunomide (ARAVA) 10 MG tablet Take 1 tablet by mouth daily.    Marland Kitchen levothyroxine (SYNTHROID, LEVOTHROID) 125 MCG tablet Take 125 mcg by mouth daily.    . mirabegron ER (MYRBETRIQ) 50 MG TB24 tablet Take 1 tablet by mouth daily.    . Multiple Vitamins-Minerals ([employer] SPECTRAVITE PO) Take 1 tablet by mouth daily.    . nebivolol (BYSTOLIC) 5 MG tablet Take 5 mg by mouth daily.    Marland Kitchen NITROSTAT 0.4 MG SL tablet PLACE 1 TABLET UNDER THE TONGUE EVERY 5 MINUTES AS NEEDED FOR CHEST PAIN. 100 tablet 1  .  Omega-3 Fatty Acids (SUPER OMEGA 3 PO) Take 1 capsule by mouth daily.    . Tamsulosin HCl (FLOMAX) 0.4 MG CAPS Take 0.4 mg by mouth daily.     No current facility-administered medications for this visit.    Physical Exam: Filed Vitals:   11/29/14 1432  BP: 136/64  Pulse: 76  Height: _0  (1.727 m)  Weight: 89.268 kg (196 lb 12.8 oz)    GEN- The patient is well appearing, alert and oriented x 3 today.   Head- normocephalic, atraumatic Eyes-  Sclera clear, conjunctiva pink Ears- hearing intact Oropharynx- clear Lungs- Clear to ausculation bilaterally, normal work of breathing Chest- pacemaker pocket is well healed Heart- Regular rate and rhythm, no murmurs, rubs or gallops, PMI not laterally displaced GI- soft, NT, ND, + BS Extremities- no clubbing, cyanosis, or edema  Pacemaker interrogation- reviewed in detail today,  See PACEART report  Assessment and Plan:  1. Complete heart block Normal pacemaker function See Pace Art report No changes today  2. HTN Regular exericise is encouraged No changes today  3. nonsustained atach/afib Will need to follow burden of atrial arrhythmias.  If his burden increases then anticoagulation may be considered  4. Mild mitral regurgitation No MR by exam today Consider repeating echo upon return  Merlin compliant Return to see me in 1 year

## 2014-11-29 NOTE — Patient Instructions (Signed)
Medication Instructions: - no changes  Labwork: - none  Procedures/Testing: - none  Follow-Up: - Remote monitoring is used to monitor your Pacemaker of ICD from home. This monitoring reduces the number of office visits required to check your device to one time per year. It allows [place] to keep an eye on the functioning of your device to ensure it is working properly. You are scheduled for a device check from home on 02/28/15. You may send your transmission at any time that day. If you have a wireless device, the transmission will be sent automatically. After your physician reviews your transmission, you will receive a postcard with your next transmission date.  - Your physician wants you to follow-up in: 1 year with [name]. You will receive a reminder letter in the mail two months in advance. If you don't receive a letter, please call our office to schedule the follow-up appointment.  Any Additional Special Instructions Will Be Listed Below (If Applicable). - none

## 2014-12-14 ENCOUNTER — Telehealth: Payer: Self-pay | Admitting: Internal Medicine

## 2014-12-14 NOTE — Telephone Encounter (Signed)
I spoke with wife and she says that when he lays down to do weights and sits up that he is red faced.  No other symptoms just his face and she was worried.  I let her know that I don't know what it could be from but she should call his PCP to discuss if she is worried.  She says his BP is good and has no other problems

## 2014-12-14 NOTE — Telephone Encounter (Signed)
Left message for wife that I'm not sure what this means but not sure it is related to any heart problem.  She can call me tomorrow and if she needs to

## 2014-12-14 NOTE — Telephone Encounter (Signed)
Follow Up    Returning call from earlier. Please call.

## 2014-12-14 NOTE — Telephone Encounter (Signed)
Follow Up  Pt wife called states that every time the patient bends down and comes back up. His face gets red. No other symptoms//sr

## 2015-02-28 ENCOUNTER — Telehealth: Payer: Self-pay | Admitting: Cardiology

## 2015-02-28 ENCOUNTER — Encounter: Payer: Medicare Other | Admitting: *Deleted

## 2015-02-28 NOTE — Telephone Encounter (Signed)
LMOVM reminding pt to send remote transmission.   

## 2015-03-01 ENCOUNTER — Encounter: Payer: Self-pay | Admitting: Cardiology

## 2015-03-12 ENCOUNTER — Telehealth: Payer: Self-pay | Admitting: Internal Medicine

## 2015-03-12 NOTE — Telephone Encounter (Signed)
1. Has your device fired? no  2. Is you device beeping? No  3. Are you experiencing draining or swelling at device site? No  4. Are you calling to see if we received your device transmission? Pt has question on what he need to do to transmit. Please call  5. Have you passed out? No

## 2015-03-12 NOTE — Telephone Encounter (Signed)
Returned patient's call.  He states that he was in [place] on the date that his remote [employer] transmission was scheduled.  Explained the steps that he can take to send a manual transmission so that his remote check can be processed.  Patient and wife verbalize understanding of instructions and state they will call back if they have any questions or concerns.

## 2015-03-13 ENCOUNTER — Ambulatory Visit (INDEPENDENT_AMBULATORY_CARE_PROVIDER_SITE_OTHER): Payer: Medicare Other | Admitting: *Deleted

## 2015-03-13 DIAGNOSIS — I442 Atrioventricular block, complete: Secondary | ICD-10-CM | POA: Diagnosis not present

## 2015-03-14 ENCOUNTER — Encounter: Payer: Self-pay | Admitting: Cardiology

## 2015-03-14 LAB — CUP PACEART REMOTE DEVICE CHECK
Battery Remaining Longevity: 95 mo
Battery Remaining Percentage: 81 %
Battery Voltage: 2.93 V
Brady Statistic AP VP Percent: 98 %
Brady Statistic AP VS Percent: 1 %
Brady Statistic AS VP Percent: 2.5 %
Brady Statistic AS VS Percent: 1 %
Brady Statistic RA Percent Paced: 97 %
Brady Statistic RV Percent Paced: 99 %
Date Time Interrogation Session: 20161018132733
Implantable Lead Implant Date: 20130716
Implantable Lead Implant Date: 20130716
Implantable Lead Location: 753859
Implantable Lead Location: 753860
Implantable Lead Model: 1948
Lead Channel Impedance Value: 360 Ohm
Lead Channel Impedance Value: 600 Ohm
Lead Channel Pacing Threshold Amplitude: 0.625 V
Lead Channel Pacing Threshold Amplitude: 0.75 V
Lead Channel Pacing Threshold Pulse Width: 0.5 ms
Lead Channel Pacing Threshold Pulse Width: 0.5 ms
Lead Channel Sensing Intrinsic Amplitude: 1.1 mV
Lead Channel Setting Pacing Amplitude: 1 V
Lead Channel Setting Pacing Amplitude: 1.625
Lead Channel Setting Pacing Pulse Width: 0.5 ms
Lead Channel Setting Sensing Sensitivity: 4 mV
Pulse Gen Model: 2210
Pulse Gen Serial Number: 7356500

## 2015-03-14 NOTE — Progress Notes (Signed)
Remote pacemaker transmission.   

## 2015-06-12 ENCOUNTER — Ambulatory Visit (INDEPENDENT_AMBULATORY_CARE_PROVIDER_SITE_OTHER): Payer: Medicare Other | Admitting: *Deleted

## 2015-06-12 DIAGNOSIS — I442 Atrioventricular block, complete: Secondary | ICD-10-CM | POA: Diagnosis not present

## 2015-06-12 NOTE — Progress Notes (Signed)
Remote pacemaker transmission.   

## 2015-06-19 LAB — CUP PACEART REMOTE DEVICE CHECK
Battery Remaining Longevity: 85 mo
Battery Remaining Percentage: 73 %
Battery Voltage: 2.92 V
Brady Statistic AP VP Percent: 97 %
Brady Statistic AP VS Percent: 1 %
Brady Statistic AS VP Percent: 2.9 %
Brady Statistic AS VS Percent: 1 %
Brady Statistic RA Percent Paced: 97 %
Brady Statistic RV Percent Paced: 99 %
Date Time Interrogation Session: 20170117084434
Implantable Lead Implant Date: 20130716
Implantable Lead Implant Date: 20130716
Implantable Lead Location: 753859
Implantable Lead Location: 753860
Implantable Lead Model: 1948
Lead Channel Impedance Value: 380 Ohm
Lead Channel Impedance Value: 610 Ohm
Lead Channel Pacing Threshold Amplitude: 0.625 V
Lead Channel Pacing Threshold Amplitude: 1 V
Lead Channel Pacing Threshold Pulse Width: 0.5 ms
Lead Channel Pacing Threshold Pulse Width: 0.5 ms
Lead Channel Sensing Intrinsic Amplitude: 1.6 mV
Lead Channel Setting Pacing Amplitude: 1.25 V
Lead Channel Setting Pacing Amplitude: 1.625
Lead Channel Setting Pacing Pulse Width: 0.5 ms
Lead Channel Setting Sensing Sensitivity: 4 mV
Pulse Gen Model: 2210
Pulse Gen Serial Number: 7356500

## 2015-06-22 ENCOUNTER — Encounter: Payer: Self-pay | Admitting: Cardiology

## 2015-08-24 ENCOUNTER — Telehealth: Payer: Self-pay | Admitting: Internal Medicine

## 2015-08-24 NOTE — Telephone Encounter (Signed)
New message     FYI Calling to let the device team know that he will be out of town starting today until 09-06-15.  He will not be taking his remote box with him

## 2015-08-24 NOTE — Telephone Encounter (Signed)
Returned patient's call.  Advised that it is fine to leave his Merlin transmitter at home and it should continue to automatically transmit when he returns.  Patient verbalized understanding and is appreciative of call.  He denies additional questions at this time.

## 2015-09-11 ENCOUNTER — Ambulatory Visit (INDEPENDENT_AMBULATORY_CARE_PROVIDER_SITE_OTHER): Payer: Medicare Other | Admitting: *Deleted

## 2015-09-11 DIAGNOSIS — I442 Atrioventricular block, complete: Secondary | ICD-10-CM | POA: Diagnosis not present

## 2015-09-11 NOTE — Progress Notes (Signed)
Remote pacemaker transmission.

## 2015-10-19 ENCOUNTER — Encounter: Payer: Self-pay | Admitting: Cardiology

## 2015-10-19 LAB — CUP PACEART REMOTE DEVICE CHECK
Battery Remaining Longevity: 86 mo
Battery Remaining Percentage: 73 %
Battery Voltage: 2.92 V
Brady Statistic AP VP Percent: 97 %
Brady Statistic AP VS Percent: 1 %
Brady Statistic AS VP Percent: 3.3 %
Brady Statistic AS VS Percent: 1 %
Brady Statistic RA Percent Paced: 96 %
Brady Statistic RV Percent Paced: 99 %
Date Time Interrogation Session: 20170418071641
Implantable Lead Implant Date: 20130716
Implantable Lead Implant Date: 20130716
Implantable Lead Location: 753859
Implantable Lead Location: 753860
Implantable Lead Model: 1948
Lead Channel Impedance Value: 380 Ohm
Lead Channel Impedance Value: 600 Ohm
Lead Channel Pacing Threshold Amplitude: 0.5 V
Lead Channel Pacing Threshold Amplitude: 1 V
Lead Channel Pacing Threshold Pulse Width: 0.5 ms
Lead Channel Pacing Threshold Pulse Width: 0.5 ms
Lead Channel Sensing Intrinsic Amplitude: 1.8 mV
Lead Channel Sensing Intrinsic Amplitude: 12 mV
Lead Channel Setting Pacing Amplitude: 1.25 V
Lead Channel Setting Pacing Amplitude: 1.5 V
Lead Channel Setting Pacing Pulse Width: 0.5 ms
Lead Channel Setting Sensing Sensitivity: 4 mV
Pulse Gen Model: 2210
Pulse Gen Serial Number: 7356500

## 2015-11-06 ENCOUNTER — Encounter: Payer: Self-pay | Admitting: Podiatry

## 2015-11-06 ENCOUNTER — Ambulatory Visit (INDEPENDENT_AMBULATORY_CARE_PROVIDER_SITE_OTHER): Payer: Medicare Other | Admitting: Podiatry

## 2015-11-06 DIAGNOSIS — M79674 Pain in right toe(s): Secondary | ICD-10-CM | POA: Diagnosis not present

## 2015-11-06 DIAGNOSIS — M79675 Pain in left toe(s): Secondary | ICD-10-CM

## 2015-11-06 DIAGNOSIS — L84 Corns and callosities: Secondary | ICD-10-CM | POA: Diagnosis not present

## 2015-11-06 DIAGNOSIS — B351 Tinea unguium: Secondary | ICD-10-CM | POA: Diagnosis not present

## 2015-11-06 NOTE — Progress Notes (Signed)
   Subjective:    [name], male    DOB: Jun 14, 1940, 75 y.o.   MRN: [id number]  HPI  75 year old male presents the also concerns of the lesions about his right foot which is been ongoing for a long time he periodically trim the area himself with a razor however does come back. Denies any redness or drainage or any swelling. He also states that he has toenail fungus and he is recently on Gregary Signs for the last year or more without any relief. Denies any redness or drainage from the toenails. The nails to become painful as they are elongated. No other complaints.  Review of Systems  All other systems reviewed and are negative.      Objective:   Physical Exam General: AAO x3, NAD  Dermatological: Hyperkeratotic lesion present right foot submetatarsal 3. Upon debridement there is no underlying ulceration, drainage or other signs of infection. No other open lesions or pre-ulcerative lesions. Nails are hypertrophic, dystrophic, discolored, brittle, elongated 10. No surrounding redness or drainage. Tenderness nails 1-5 bilaterally.   Vascular: Dorsalis Pedis artery and Posterior Tibial artery pedal pulses are 2/4 bilateral with immedate capillary fill time. Pedal hair growth present. No varicosities and no lower extremity edema present bilateral. There is no pain with calf compression, swelling, warmth, erythema.   Neruologic: Grossly intact via light touch bilateral. Vibratory intact via tuning fork bilateral. Protective threshold with Semmes Wienstein monofilament intact to all pedal sites bilateral. Patellar and Achilles deep tendon reflexes 2+ bilateral. No Babinski or clonus noted bilateral.   Musculoskeletal: there is prominent metatarsal heads plantarly with atrophy of the fat pad. MMT 5/5. Range of motion intact.   Gait: Unassisted, Nonantalgic.     Assessment & Plan:  75 year old male with symptomatic onychomycosis, right foot hyperkeratotic lesion -Treatment options  discussed including all alternatives, risks, and complications -Etiology of symptoms were discussed -Nails debrided 10 without competitions or bleeding. I did culture the nails there were sent to [facility] labs as he would like to consider further treatment. -Hyperkeratotic lesion debrided 1 without complications or bleeding. We started to the area daily. Offloading pads. -Follow-up and nail culture sooner if needed.  Celesta Gentile, DPM

## 2015-11-06 NOTE — Patient Instructions (Signed)
If was nice to meet you today. If you have any questions or any further concerns, please feel fee to give me a call. You can call our office at [phone] or please feel fee to send me a message through [place].   Corns and Calluses Corns are small areas of thickened skin that occur on the top, sides, or tip of a toe. They contain a cone-shaped core with a point that can press on a nerve below. This causes pain. Calluses are areas of thickened skin that can occur anywhere on the body including hands, fingers, palms, soles of the feet, and heels.Calluses are usually larger than corns.  CAUSES    Corns and calluses are caused by rubbing (friction) or pressure, such as from shoes that are too tight or do not fit properly.  RISK FACTORS Corns are more likely to develop in people who have toe deformities, such as hammer toes. Since calluses can occur with friction to any area of the skin, calluses are more likely to develop in people who:   Work with their hands.  Wear shoes that fit poorly, shoes that are too tight, or shoes that are high-heeled.  Have toes deformities. SYMPTOMS Symptoms of a corn or callus include:  A hard growth on the skin.   Pain or tenderness under the skin.   Redness and swelling.   Increased discomfort while wearing tight-fitting shoes. DIAGNOSIS  Corns and calluses may be diagnosed with a medical history and physical exam.  TREATMENT  Corns and calluses may be treated with:  Removing the cause of the friction or pressure. This may include:  Changing your shoes.  Wearing shoe inserts (orthotics) or other protective layers in your shoes, such as a corn pad.  Wearing gloves.  Medicines to help soften skin in the hardened, thickened areas.  Reducing the size of the corn or callus by removing the dead layers of skin.  Antibiotic medicines to treat infection.  Surgery, if a toe deformity is the cause. HOME CARE INSTRUCTIONS   Take medicines only  as directed by your health care provider.  If you were prescribed an antibiotic, finish all of it even if you start to feel better.  Wear shoes that fit well. Avoid wearing high-heeled shoes and shoes that are too tight or too loose.  Wear any padding, protective layers, gloves, or orthotics as directed by your health care provider.  Soak your hands or feet and then use a file or pumice stone to soften your corn or callus. Do this as directed by your health care provider.  Check your corn or callus every day for signs of infection. Watch for:  Redness, swelling, or pain.  Fluid, blood, or pus. SEEK MEDICAL CARE IF:   Your symptoms do not improve with treatment.  You have increased redness, swelling, or pain at the site of your corn or callus.  You have fluid, blood, or pus coming from your corn or callus.  You have new symptoms.   This information is not intended to replace advice given to you by your health care provider. Make sure you discuss any questions you have with your health care provider.   Document Released: 02/16/2004 Document Revised: 09/26/2014 Document Reviewed: 05/08/2014 Elsevier Interactive Patient Education 2016 Elsevier Inc. Onychomycosis/Fungal Toenails  WHAT IS IT? An infection that lies within the keratin of your nail plate that is caused by a fungus.  WHY ME? Fungal infections affect all ages, sexes, races, and creeds.  There may  be many factors that predispose you to a fungal infection such as age, coexisting medical conditions such as diabetes, or an autoimmune disease; stress, medications, fatigue, genetics, etc.  Bottom line: fungus thrives in a warm, moist environment and your shoes offer such a location.  IS IT CONTAGIOUS? Theoretically, yes.  You do not want to share shoes, nail clippers or files with someone who has fungal toenails.  Walking around barefoot in the same room or sleeping in the same bed is unlikely to transfer the organism.  It is  important to realize, however, that fungus can spread easily from one nail to the next on the same foot.  HOW DO WE TREAT THIS?  There are several ways to treat this condition.  Treatment may depend on many factors such as age, medications, pregnancy, liver and kidney conditions, etc.  It is best to ask your doctor which options are available to you.   No treatment.   Unlike many other medical concerns, you can live with this condition.  However for many people this can be a painful condition and may lead to ingrown toenails or a bacterial infection.  It is recommended that you keep the nails cut short to help reduce the amount of fungal nail.  Topical treatment.  These range from herbal remedies to prescription strength nail lacquers.  About 40-50% effective, topicals require twice daily application for approximately 9 to 12 months or until an entirely new nail has grown out.  The most effective topicals are medical grade medications available through physicians offices.  Oral antifungal medications.  With an 80-90% cure rate, the most common oral medication requires 3 to 4 months of therapy and stays in your system for a year as the new nail grows out.  Oral antifungal medications do require blood work to make sure it is a safe drug for you.  A liver function panel will be performed prior to starting the medication and after the first month of treatment.  It is important to have the blood work performed to avoid any harmful side effects.  In general, this medication safe but blood work is required.  Laser Therapy.  This treatment is performed by applying a specialized laser to the affected nail plate.  This therapy is noninvasive, fast, and non-painful.  It is not covered by insurance and is therefore, out of pocket.  The results have been very good with a 80-95% cure rate.  [facility] is the only practice in the area to offer this therapy.  Permanent Nail Avulsion.  Removing the entire  nail so that a new nail will not grow back.

## 2015-12-03 ENCOUNTER — Ambulatory Visit (INDEPENDENT_AMBULATORY_CARE_PROVIDER_SITE_OTHER): Payer: Medicare Other | Admitting: Internal Medicine

## 2015-12-03 ENCOUNTER — Encounter: Payer: Self-pay | Admitting: Internal Medicine

## 2015-12-03 VITALS — BP 126/46 | HR 72 | Ht 68.0 in | Wt 189.2 lb

## 2015-12-03 DIAGNOSIS — R079 Chest pain, unspecified: Secondary | ICD-10-CM

## 2015-12-03 DIAGNOSIS — I442 Atrioventricular block, complete: Secondary | ICD-10-CM

## 2015-12-03 DIAGNOSIS — I10 Essential (primary) hypertension: Secondary | ICD-10-CM

## 2015-12-03 DIAGNOSIS — I340 Nonrheumatic mitral (valve) insufficiency: Secondary | ICD-10-CM | POA: Diagnosis not present

## 2015-12-03 HISTORY — DX: Chest pain, unspecified: R07.9

## 2015-12-03 LAB — CUP PACEART INCLINIC DEVICE CHECK
Battery Remaining Longevity: 76.8
Battery Voltage: 2.92 V
Brady Statistic RA Percent Paced: 96 %
Brady Statistic RV Percent Paced: 99.52 %
Date Time Interrogation Session: 20170710162120
Implantable Lead Implant Date: 20130716
Implantable Lead Implant Date: 20130716
Implantable Lead Location: 753859
Implantable Lead Location: 753860
Implantable Lead Model: 1948
Lead Channel Impedance Value: 337.5 Ohm
Lead Channel Impedance Value: 600 Ohm
Lead Channel Pacing Threshold Amplitude: 0.5 V
Lead Channel Pacing Threshold Amplitude: 0.625 V
Lead Channel Pacing Threshold Pulse Width: 0.5 ms
Lead Channel Pacing Threshold Pulse Width: 0.5 ms
Lead Channel Sensing Intrinsic Amplitude: 1.8 mV
Lead Channel Sensing Intrinsic Amplitude: 12 mV
Lead Channel Setting Pacing Amplitude: 0.875
Lead Channel Setting Pacing Amplitude: 1.625
Lead Channel Setting Pacing Pulse Width: 0.5 ms
Lead Channel Setting Sensing Sensitivity: 4 mV
Pulse Gen Model: 2210
Pulse Gen Serial Number: 7356500

## 2015-12-03 MED ORDER — NITROGLYCERIN 0.4 MG SL SUBL: 0.4000 mg | SUBLINGUAL_TABLET | SUBLINGUAL | Status: DC | PRN

## 2015-12-03 NOTE — Progress Notes (Signed)
PCP: [name], MD  Cameron [name] is a 75 y.o. male who presents today for routine electrophysiology followup.  He is doing very well.  He and his new wife (married slightly longer than 2 years) have been traveling and enjoying life.  They travel frequently.  He has noticed recent onset of chest discomfort.  This occurs at rest.  Denies exertional symptoms.  Exercises regularly.  Today, he denies symptoms of palpitations, exertional chest pain, shortness of breath,  lower extremity edema, presyncope, or syncope.  The patient is otherwise without complaint today.   Past Medical History  Diagnosis Date  . Rheumatoid arthritis(714.0)   . Hypertension   . Hypothyroidism   . BPH (benign prostatic hypertrophy)   . Complete heart block (HCC)     s/p PPM   Past Surgical History  Procedure Laterality Date  . Pacemaker insertion  12/09/11    SJM Accent DR RF implanted by DR Dyani Babel for complete heart block and syncope  . Permanent pacemaker insertion N/A 12/09/2011    Procedure: PERMANENT PACEMAKER INSERTION;  Surgeon: [name], MD;  Location: [facility] CATH LAB;  Service: Cardiovascular;  Laterality: N/A;    Current Outpatient Prescriptions  Medication Sig Dispense Refill  . aspirin EC 81 MG tablet Take 81 mg by mouth daily.    . Coenzyme Q10 (CO Q 10) 100 MG CAPS Take 100 mg by mouth daily.    . hydroxychloroquine (PLAQUENIL) 200 MG tablet Take 1 tablet by mouth 2 (two) times daily.    Marland Kitchen leflunomide (ARAVA) 10 MG tablet Take 1 tablet by mouth daily.    Marland Kitchen levothyroxine (SYNTHROID, LEVOTHROID) 125 MCG tablet Take 125 mcg by mouth daily.    Marland Kitchen MAGNESIUM PO Take 1 tablet by mouth every other day.    . mirabegron ER (MYRBETRIQ) 50 MG TB24 tablet Take 1 tablet by mouth daily.    . nebivolol (BYSTOLIC) 5 MG tablet Take 5 mg by mouth daily.    Marland Kitchen NITROSTAT 0.4 MG SL tablet PLACE 1 TABLET UNDER THE TONGUE EVERY 5 MINUTES AS NEEDED FOR CHEST PAIN. 100 tablet 1  . Omega-3 Fatty Acids (OMEGA 3 PO) Take  1 tablet by mouth daily. Omega 3 XL    . Tamsulosin HCl (FLOMAX) 0.4 MG CAPS Take 0.4 mg by mouth daily.    Marland Kitchen thyroid (ARMOUR) 90 MG tablet Take 90 mg by mouth daily.    . Turmeric (CURCUMIN 95 PO) Take 1 tablet by mouth daily.     No current facility-administered medications for this visit.    Physical Exam: Filed Vitals:   12/03/15 1351  BP: 126/46  Pulse: 72  Height: _0  (1.727 m)  Weight: 189 lb 3.2 oz (85.821 kg)  SpO2: 96%    GEN- The patient is well appearing, alert and oriented x 3 today.   Head- normocephalic, atraumatic Eyes-  Sclera clear, conjunctiva pink Ears- hearing intact Oropharynx- clear Lungs- Clear to ausculation bilaterally, normal work of breathing Chest- pacemaker pocket is well healed Heart- Regular rate and rhythm, no murmurs, rubs or gallops, PMI not laterally displaced GI- soft, NT, ND, + BS Extremities- no clubbing, cyanosis, or edema  Pacemaker interrogation- reviewed in detail today,  See PACEART report  Assessment and Plan:  1. Complete heart block Normal pacemaker function See Pace Art report No changes today PMT noted however, no VA conduction today  2. HTN Stable No change required today  3. nonsustained atach/afib Will need to follow burden of atrial arrhythmias.  If his burden increases then anticoagulation may be considered  4. Mild mitral regurgitation No MR by exam today Repeat echo at this time  5. Chest discomfort Both typical and atypical features. Given V pacing, unable to order ETT.  Will order lexiscan myoview for further CV risk stratification  Merlin compliant Return to see me in 1 year  [name] MD, [facility] 12/03/2015 2:38 PM

## 2015-12-03 NOTE — Patient Instructions (Signed)
Your physician recommends that you continue on your current medications as directed. Please refer to the Current Medication list given to you today.  Your physician has requested that you have a lexiscan myoview. For further information please visit [email or web address]. Please follow instruction sheet, as given.  Your physician has requested that you have an echocardiogram. Echocardiography is a painless test that uses sound waves to create images of your heart. It provides your doctor with information about the size and shape of your heart and how well your heart's chambers and valves are working. This procedure takes approximately one hour. There are no restrictions for this procedure.  Your physician wants you to follow-up in: 12 months with [name].  You will receive a reminder letter in the mail two months in advance. If you don't receive a letter, please call our office to schedule the follow-up appointment. Remote monitoring is used to monitor your Pacemaker of ICD from home. This monitoring reduces the number of office visits required to check your device to one time per year. It allows [place] to keep an eye on the functioning of your device to ensure it is working properly. You are scheduled for a device check from home on 03/03/16. You may send your transmission at any time that day. If you have a wireless device, the transmission will be sent automatically. After your physician reviews your transmission, you will receive a postcard with your next transmission date.

## 2015-12-07 ENCOUNTER — Telehealth: Payer: Self-pay | Admitting: Internal Medicine

## 2015-12-07 NOTE — Telephone Encounter (Addendum)
Given V pacing, unable to order ETT. Will order lexiscan myoview for further CV risk stratification.   Spoke with patient and he understands why we have to do the lexiscan vs. Exercise myoveiw.

## 2015-12-07 NOTE — Telephone Encounter (Signed)
New Message   Pt wife wants the nurse to call her concerning the scheduled stress test. Please call.

## 2015-12-11 ENCOUNTER — Encounter: Payer: Self-pay | Admitting: Podiatry

## 2015-12-11 ENCOUNTER — Encounter (INDEPENDENT_AMBULATORY_CARE_PROVIDER_SITE_OTHER): Payer: Self-pay

## 2015-12-11 ENCOUNTER — Ambulatory Visit (INDEPENDENT_AMBULATORY_CARE_PROVIDER_SITE_OTHER): Payer: Medicare Other | Admitting: Podiatry

## 2015-12-11 DIAGNOSIS — B351 Tinea unguium: Secondary | ICD-10-CM

## 2015-12-11 MED ORDER — TERBINAFINE HCL 250 MG PO TABS: 250.0000 mg | ORAL_TABLET | Freq: Every day | ORAL | Status: DC

## 2015-12-13 ENCOUNTER — Telehealth (HOSPITAL_COMMUNITY): Payer: Self-pay | Admitting: *Deleted

## 2015-12-13 NOTE — Telephone Encounter (Signed)
Left message on voicemail per DPR in reference to upcoming appointment scheduled on 9/25/17with detailed instructions given per Myocardial Perfusion Study Information Sheet for the test. LM to arrive 15 minutes early, and that it is imperative to arrive on time for appointment to keep from having the test rescheduled. If you need to cancel or reschedule your appointment, please call the office within 24 hours of your appointment. Failure to do so may result in a cancellation of your appointment, and a $50 no show fee. Phone number given for call back for any questions.  Hubbard Robinson, RN

## 2015-12-18 ENCOUNTER — Encounter (INDEPENDENT_AMBULATORY_CARE_PROVIDER_SITE_OTHER): Payer: Self-pay

## 2015-12-18 ENCOUNTER — Other Ambulatory Visit (HOSPITAL_COMMUNITY): Payer: Self-pay

## 2015-12-18 ENCOUNTER — Ambulatory Visit (HOSPITAL_BASED_OUTPATIENT_CLINIC_OR_DEPARTMENT_OTHER): Payer: Medicare Other

## 2015-12-18 ENCOUNTER — Ambulatory Visit (HOSPITAL_COMMUNITY): Payer: Medicare Other | Attending: Cardiology

## 2015-12-18 DIAGNOSIS — I442 Atrioventricular block, complete: Secondary | ICD-10-CM

## 2015-12-18 DIAGNOSIS — I454 Nonspecific intraventricular block: Secondary | ICD-10-CM | POA: Diagnosis not present

## 2015-12-18 DIAGNOSIS — R079 Chest pain, unspecified: Secondary | ICD-10-CM

## 2015-12-18 DIAGNOSIS — I10 Essential (primary) hypertension: Secondary | ICD-10-CM | POA: Diagnosis not present

## 2015-12-18 DIAGNOSIS — I119 Hypertensive heart disease without heart failure: Secondary | ICD-10-CM | POA: Insufficient documentation

## 2015-12-18 DIAGNOSIS — I340 Nonrheumatic mitral (valve) insufficiency: Secondary | ICD-10-CM | POA: Insufficient documentation

## 2015-12-18 DIAGNOSIS — I358 Other nonrheumatic aortic valve disorders: Secondary | ICD-10-CM | POA: Insufficient documentation

## 2015-12-18 DIAGNOSIS — I071 Rheumatic tricuspid insufficiency: Secondary | ICD-10-CM | POA: Insufficient documentation

## 2015-12-18 DIAGNOSIS — R9439 Abnormal result of other cardiovascular function study: Secondary | ICD-10-CM | POA: Diagnosis not present

## 2015-12-18 LAB — ECHOCARDIOGRAM COMPLETE
E decel time: 349 msec
E/e' ratio: 17.36
FS: 23 % — AB (ref 28–44)
IVS/LV PW RATIO, ED: 1.34
LA ID, A-P, ES: 49 mm
LA diam end sys: 49 mm
LA diam index: 2.45 cm/m2
LA vol A4C: 64.8 ml
LA vol index: 31.1 mL/m2
LA vol: 62.1 mL
LV E/e' medial: 17.36
LV E/e'average: 17.36
LV PW d: 10.2 mm — AB (ref 0.6–1.1)
LV e' LATERAL: 5.73 cm/s
LVOT SV: 62 mL
LVOT VTI: 21.8 cm
LVOT area: 2.84 cm2
LVOT diameter: 19 mm
LVOT peak vel: 88.3 cm/s
MV Dec: 349
MV Peak grad: 4 mmHg
MV pk A vel: 102 m/s
MV pk E vel: 99.5 m/s
Reg peak vel: 345 cm/s
TAPSE: 22.8 mm
TDI e' lateral: 5.73
TDI e' medial: 5.07
TR max vel: 345 cm/s
VTI: 227 cm

## 2015-12-18 LAB — MYOCARDIAL PERFUSION IMAGING
LV dias vol: 133 mL (ref 62–150)
LV sys vol: 82 mL
Peak HR: 65 {beats}/min
RATE: 0.29
Rest HR: 61 {beats}/min
SDS: 6
SRS: 3
SSS: 9
TID: 0.98

## 2015-12-18 MED ORDER — TECHNETIUM TC 99M TETROFOSMIN IV KIT
10.2000 | PACK | Freq: Once | INTRAVENOUS | Status: AC | PRN
  Administered 2015-12-18: 10 via INTRAVENOUS
  Filled 2015-12-18: qty 10

## 2015-12-18 MED ORDER — REGADENOSON 0.4 MG/5ML IV SOLN
0.4000 mg | Freq: Once | INTRAVENOUS | Status: AC
  Administered 2015-12-18: 0.4 mg via INTRAVENOUS

## 2015-12-18 MED ORDER — TECHNETIUM TC 99M TETROFOSMIN IV KIT
32.4000 | PACK | Freq: Once | INTRAVENOUS | Status: AC | PRN
  Administered 2015-12-18: 32 via INTRAVENOUS
  Filled 2015-12-18: qty 32

## 2015-12-20 DIAGNOSIS — B351 Tinea unguium: Secondary | ICD-10-CM | POA: Insufficient documentation

## 2015-12-20 HISTORY — DX: Tinea unguium: B35.1

## 2015-12-20 NOTE — Progress Notes (Signed)
Subjective: 75 year old male presents the office they discuss nail culture results. Denies any acute changes since last appointment and no new concerns today. Denies any redness or drainage or pain to the toenails this time. Objective: AAO x3, NAD DP/PT pulses palpable bilaterally, CRT less than 3 seconds Nails appear to be hypertrophic, dystrophic, brittle, discolored. There is no swelling redness or drainage. No tenderness nails this time. No edema, erythema, increase in warmth to bilateral lower extremities.  No open lesions or pre-ulcerative lesions.  No pain with calf compression, swelling, warmth, erythema  Assessment: Onychomycosis  Plan: -All treatment options discussed with the patient including all alternatives, risks, complications.  -Nail biopsy results were discussed the patient. This time he wishes to proceed with topical treatment after discussing multiple treatment options. Order compound cream for onychomycosis. Discussed length of use as well as side effects and application instructions. -Patient encouraged to call the office with any questions, concerns, change in symptoms.   Celesta Gentile, DPM

## 2015-12-21 ENCOUNTER — Telehealth: Payer: Self-pay | Admitting: Internal Medicine

## 2015-12-21 NOTE — Telephone Encounter (Signed)
New message        The pt's wife is wanting to get the test results from the other day

## 2015-12-24 NOTE — Telephone Encounter (Signed)
12-24-15 1149am pt's wife Loletha Carrow calling to get stress test results from 12-18-15-pls call back at [phone] or [phone]

## 2015-12-24 NOTE — Telephone Encounter (Signed)
I will route to primary care also. Given newly reduced EF and ischemia in setting of chest pain. Would advise cath with possible PCI. Please bring in to see APP to arrange.  I have spoken with patienta nd his wife and asked they come tomorrow to be seen by Tommye Standard, PA per Dr [name]

## 2015-12-25 ENCOUNTER — Encounter: Payer: Self-pay | Admitting: *Deleted

## 2015-12-25 ENCOUNTER — Ambulatory Visit (INDEPENDENT_AMBULATORY_CARE_PROVIDER_SITE_OTHER): Payer: Medicare Other | Admitting: Physician Assistant

## 2015-12-25 ENCOUNTER — Encounter: Payer: Self-pay | Admitting: Physician Assistant

## 2015-12-25 ENCOUNTER — Encounter (INDEPENDENT_AMBULATORY_CARE_PROVIDER_SITE_OTHER): Payer: Self-pay

## 2015-12-25 VITALS — BP 140/67 | HR 67 | Ht 68.0 in | Wt 192.0 lb

## 2015-12-25 DIAGNOSIS — I442 Atrioventricular block, complete: Secondary | ICD-10-CM | POA: Diagnosis not present

## 2015-12-25 DIAGNOSIS — R9439 Abnormal result of other cardiovascular function study: Secondary | ICD-10-CM

## 2015-12-25 DIAGNOSIS — R0789 Other chest pain: Secondary | ICD-10-CM

## 2015-12-25 DIAGNOSIS — I10 Essential (primary) hypertension: Secondary | ICD-10-CM | POA: Diagnosis not present

## 2015-12-25 DIAGNOSIS — I2510 Atherosclerotic heart disease of native coronary artery without angina pectoris: Secondary | ICD-10-CM

## 2015-12-25 HISTORY — DX: Atherosclerotic heart disease of native coronary artery without angina pectoris: I25.10

## 2015-12-25 NOTE — Patient Instructions (Addendum)
Medication Instructions:    Your physician recommends that you continue on your current medications as directed. Please refer to the Current Medication list given to you today.    If you need a refill on your cardiac medications before your next appointment, please call your pharmacy.  Labwork: BEMT CBC AND PT/PTT   Testing/Procedures: SEE LETTER FOR CATH INSTRUCTIONS   Follow-Up:  ONE MONTH POST CATH AFTER 12/28/2015 WITH DR [name] OR [facility]   Any Other Special Instructions Will Be Listed Below (If Applicable).

## 2015-12-25 NOTE — Progress Notes (Signed)
Cardiology Office Note Date:  12/25/2015  Patient ID:  Cameron [name], Cameron [name] [birth year], MRN [id number] PCP:  [name], MD  Electrophysiologist:  [name]   Chief Complaint:  Test results  History of Present Illness: Cameron [name] is a 75 y.o. male with history of CHB w/PPM, RA, HTN, hypothyroidism, recently 12/03/15 by [name] for a pacer visit mentioning to him some chest discomfort he had been getting.  He comes today to be seen for [name] to discuss test results.  The patient is feeling well.  He has not had any symptoms since his visit with [name].  He described what he thought may be indigestion, though not necessarily food/meal related, happened a few times for a couple days then none further.  He had no associated symptoms, no palpitations or SOB, no dizziness, near syncope or syncope.  He has felt well since the visit with [name].  He has rx for s/l NTG but had not had further symptoms or felt the need to use it.  Past Medical History:  Diagnosis Date  . BPH (benign prostatic hypertrophy)   . 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 Hypertension   . Hypothyroidism   . Rheumatoid arthritis(714.0)     Past Surgical History:  Procedure Laterality Date  . PACEMAKER INSERTION  12/09/11   SJM Accent DR RF implanted by DR Allred for complete heart block and syncope  . PERMANENT PACEMAKER INSERTION N/A 12/09/2011   Procedure: PERMANENT PACEMAKER INSERTION;  Surgeon: [name], MD;  Location: [facility] CATH LAB;  Service: Cardiovascular;  Laterality: N/A;    Current Outpatient Prescriptions  Medication Sig Dispense Refill  . aspirin EC 81 MG tablet Take 81 mg by mouth daily.    . Coenzyme Q10 (CO Q 10) 100 MG CAPS Take 100 mg by mouth daily.    . DHA-EPA-Vitamin E (OMEGA-3 COMPLEX PO) Take 2 tablets by mouth daily.    . hydroxychloroquine (PLAQUENIL) 200 MG tablet Take 1 tablet by mouth 2 (two) times daily.    Marland Kitchen leflunomide (ARAVA) 10 MG tablet Take 1 tablet  by mouth daily.    Marland Kitchen levothyroxine (SYNTHROID, LEVOTHROID) 125 MCG tablet Take 125 mcg by mouth daily.    Marland Kitchen MAGNESIUM PO Take 1 tablet by mouth every other day.    . mirabegron ER (MYRBETRIQ) 50 MG TB24 tablet Take 1 tablet by mouth daily.    . nebivolol (BYSTOLIC) 5 MG tablet Take 5 mg by mouth daily.    . nitroGLYCERIN (NITROSTAT) 0.4 MG SL tablet Place 1 tablet (0.4 mg total) under the tongue every 5 (five) minutes as needed for chest pain. 25 tablet 3  . Omega-3 Fatty Acids (OMEGA 3 PO) Take 1 tablet by mouth daily. Omega 3 XL    . Tamsulosin HCl (FLOMAX) 0.4 MG CAPS Take 0.4 mg by mouth daily.    Marland Kitchen thyroid (ARMOUR) 90 MG tablet Take 90 mg by mouth daily.    . Turmeric (CURCUMIN 95 PO) Take 1 tablet by mouth daily.     No current facility-administered medications for this visit.     Allergies:   Celecoxib   Social History:  The patient  reports that he has never smoked. He has quit using smokeless tobacco. He reports that he drinks alcohol. He reports that he does not use drugs.   Family History:  The patient's family history includes Alzheimer's disease in his paternal grandmother and sister; Arthritis in his maternal  grandfather; Bone cancer in his father; Heart attack in his paternal uncle; Lung cancer in his paternal grandfather; Pancreatic cancer in his mother.  ROS:  Please see the history of present illness. All other systems are reviewed and otherwise negative.   PHYSICAL EXAM:  VS:  BP 140/67   Pulse 67   Ht 5' 8" (1.727 m)   Wt 192 lb (87.1 kg)   SpO2 95%   BMI 29.19 kg/m  BMI: Body mass index is 29.19 kg/m. Well nourished, well developed, in no acute distress  HEENT: normocephalic, atraumatic  Neck: no JVD, carotid bruits or masses Cardiac: RRR; 1-2/6 SM, no rubs, or gallops Lungs:  clear to auscultation bilaterally, no wheezing, rhonchi or rales  Abd: soft, nontender MS: no deformity or atrophy Ext: no edema  Skin: warm and dry, no rash Neuro:  No gross  deficits appreciated Psych: euthymic mood, full affect  PPM site is stable, no tethering or discomfort   EKG:  Done today shows AV paced, PVCs PPM interrogation today shows normal function, PMTs noted, PVARP was extended slightly, AMS episodes noted longest 16 seconds since last check  12/18/15: Echocardiogram Study Conclusions - Left ventricle: The cavity size was normal. There was mild focal   basal hypertrophy of the septum. Systolic function was mildly   reduced. The estimated ejection fraction was in the range of 45%   to 50%. There is hypokinesis of the mid-apical anteroseptal   myocardium with asynchronous contraction (bundle branch block/   paced). Doppler parameters are consistent with abnormal left   ventricular relaxation (grade 1 diastolic dysfunction). Doppler   parameters are consistent with high ventricular filling pressure. - Aortic valve: Trileaflet; mildly thickened, mildly calcified   leaflets. - Mitral valve: Calcified annulus. Mildly thickened leaflets .   There was moderate regurgitation. - Left atrium: The atrium was moderately dilated. - Right ventricle: The cavity size was mildly dilated. Wall   thickness was normal. Pacer wire or catheter noted in right   ventricle. - Tricuspid valve: There was moderate regurgitation. - Pulmonary arteries: Systolic pressure was moderately increased.   PA peak pressure: 51 mm Hg (S).  12/18/15: Leane Call Study Highlights   Nuclear stress EF: 38%.  There was no ST segment deviation noted during stress.  No T wave inversion was noted during stress.  Defect 1: There is a small defect of moderate severity present in the apical inferior and apex location.  Findings consistent with ischemia.  This is an intermediate risk study.  The left ventricular ejection fraction is moderately decreased (30-44%).     Recent Labs: No results found for requested labs within last 8760 hours.  No results found for requested  labs within last 8760 hours.   CrCl cannot be calculated (Patient's most recent lab result is older than the maximum 21 days allowed.).   Wt Readings from Last 3 Encounters:  12/25/15 192 lb (87.1 kg)  12/03/15 189 lb 3.2 oz (85.8 kg)  11/29/14 196 lb 12.8 oz (89.3 kg)     Other studies reviewed: Additional studies/records reviewed today include: summarized above  ASSESSMENT AND PLAN:  1. Complete heart block Normal pacemaker is intact See Pace Art report No changes were made PMT noted however, no VA conduction was noted  2. HTN Stable  3. nonsustained atach/afib     Will need to follow burden of atrial arrhythmias.      If his burden increases then anticoagulation may be considered  4. Mod mitral regurgitation  5. Chest discomfort      Both typical and atypical features, none since his last visit.     Newly reduced EF with WMA on his echo and abnormal stress myoview     [name] recommends cardiac cath, poss PCI if indicated     discussed cardiac cath procedure, possible PCI, risks/benefits and the patient would like to proceed.     He is instructed to avoid vigorous exercise/actvities until after his cath/findings     Discussed s/l NTG, how/when to use, and if recurrent CP to seek medical attention  Disposition: BMET/CBC, coags for pre-procedure, will schedule cardiac cath/possible PCI and post procedure visit pending findings.  Current medicines are reviewed at length with the patient today.  The patient did not have any concerns regarding medicines.  Haywood Lasso, PA-C 12/25/2015 5:02 PM     [place] [address] [phone] (office)  [phone] (fax)

## 2015-12-26 ENCOUNTER — Encounter (INDEPENDENT_AMBULATORY_CARE_PROVIDER_SITE_OTHER): Payer: Self-pay

## 2015-12-26 ENCOUNTER — Encounter: Payer: Self-pay | Admitting: Internal Medicine

## 2015-12-26 ENCOUNTER — Other Ambulatory Visit (INDEPENDENT_AMBULATORY_CARE_PROVIDER_SITE_OTHER): Payer: Medicare Other

## 2015-12-26 DIAGNOSIS — R9439 Abnormal result of other cardiovascular function study: Secondary | ICD-10-CM

## 2015-12-26 DIAGNOSIS — R0789 Other chest pain: Secondary | ICD-10-CM | POA: Diagnosis not present

## 2015-12-26 LAB — BASIC METABOLIC PANEL
BUN: 20 mg/dL (ref 7–25)
CO2: 29 mmol/L (ref 20–31)
Calcium: 9.7 mg/dL (ref 8.6–10.3)
Chloride: 106 mmol/L (ref 98–110)
Creat: 1.1 mg/dL (ref 0.70–1.18)
Glucose, Bld: 151 mg/dL — ABNORMAL HIGH (ref 65–99)
Potassium: 5.2 mmol/L (ref 3.5–5.3)
Sodium: 142 mmol/L (ref 135–146)

## 2015-12-26 LAB — CBC
HCT: 46.3 % (ref 38.5–50.0)
Hemoglobin: 15.6 g/dL (ref 13.2–17.1)
MCH: 31.1 pg (ref 27.0–33.0)
MCHC: 33.7 g/dL (ref 32.0–36.0)
MCV: 92.4 fL (ref 80.0–100.0)
MPV: 10.3 fL (ref 7.5–12.5)
Platelets: 177 10*3/uL (ref 140–400)
RBC: 5.01 MIL/uL (ref 4.20–5.80)
RDW: 13.2 % (ref 11.0–15.0)
WBC: 7.2 10*3/uL (ref 3.8–10.8)

## 2015-12-26 LAB — APTT: aPTT: 26 s (ref 22–34)

## 2015-12-27 ENCOUNTER — Telehealth: Payer: Self-pay | Admitting: *Deleted

## 2015-12-27 ENCOUNTER — Other Ambulatory Visit: Payer: Medicare Other | Admitting: *Deleted

## 2015-12-27 ENCOUNTER — Encounter: Payer: Self-pay | Admitting: *Deleted

## 2015-12-27 ENCOUNTER — Other Ambulatory Visit: Payer: Self-pay | Admitting: *Deleted

## 2015-12-27 DIAGNOSIS — Z01818 Encounter for other preprocedural examination: Secondary | ICD-10-CM

## 2015-12-27 LAB — PROTIME-INR
INR: 1.1
Prothrombin Time: 11.7 s — ABNORMAL HIGH (ref 9.0–11.5)

## 2015-12-27 NOTE — Telephone Encounter (Signed)
SPOKE TO PT ABOUT LAB RESULTS AND IF POSSIBLE TO RETURN TO LAB FOR PT/INR LABS FOR PROCEDURE TOMORROW. PT MENTIONED  THEY WILL GET UP TO OFFICE ASAP

## 2015-12-28 ENCOUNTER — Encounter (HOSPITAL_COMMUNITY)
Admission: RE | Disposition: A | Discharge status: Home / Self Care | Payer: Self-pay | Source: Ambulatory Visit | Attending: Interventional Cardiology

## 2015-12-28 ENCOUNTER — Encounter (HOSPITAL_COMMUNITY): Payer: Self-pay | Admitting: *Deleted

## 2015-12-28 ENCOUNTER — Ambulatory Visit (HOSPITAL_COMMUNITY)
Admission: RE | Admit: 2015-12-28 | Discharge: 2015-12-28 | Disposition: A | Discharge status: Home / Self Care | Payer: Medicare Other | Source: Ambulatory Visit | Attending: Interventional Cardiology | Admitting: Interventional Cardiology

## 2015-12-28 DIAGNOSIS — I10 Essential (primary) hypertension: Secondary | ICD-10-CM | POA: Insufficient documentation

## 2015-12-28 DIAGNOSIS — I4891 Unspecified atrial fibrillation: Secondary | ICD-10-CM | POA: Diagnosis not present

## 2015-12-28 DIAGNOSIS — I442 Atrioventricular block, complete: Secondary | ICD-10-CM | POA: Diagnosis not present

## 2015-12-28 DIAGNOSIS — N400 Enlarged prostate without lower urinary tract symptoms: Secondary | ICD-10-CM | POA: Diagnosis not present

## 2015-12-28 DIAGNOSIS — Z7982 Long term (current) use of aspirin: Secondary | ICD-10-CM | POA: Diagnosis not present

## 2015-12-28 DIAGNOSIS — Z950 Presence of cardiac pacemaker: Secondary | ICD-10-CM | POA: Diagnosis not present

## 2015-12-28 DIAGNOSIS — I340 Nonrheumatic mitral (valve) insufficiency: Secondary | ICD-10-CM | POA: Insufficient documentation

## 2015-12-28 DIAGNOSIS — R9439 Abnormal result of other cardiovascular function study: Secondary | ICD-10-CM | POA: Insufficient documentation

## 2015-12-28 DIAGNOSIS — Z808 Family history of malignant neoplasm of other organs or systems: Secondary | ICD-10-CM | POA: Diagnosis not present

## 2015-12-28 DIAGNOSIS — Z801 Family history of malignant neoplasm of trachea, bronchus and lung: Secondary | ICD-10-CM | POA: Diagnosis not present

## 2015-12-28 DIAGNOSIS — R079 Chest pain, unspecified: Secondary | ICD-10-CM | POA: Diagnosis present

## 2015-12-28 DIAGNOSIS — I2510 Atherosclerotic heart disease of native coronary artery without angina pectoris: Secondary | ICD-10-CM | POA: Insufficient documentation

## 2015-12-28 DIAGNOSIS — M069 Rheumatoid arthritis, unspecified: Secondary | ICD-10-CM | POA: Insufficient documentation

## 2015-12-28 DIAGNOSIS — E039 Hypothyroidism, unspecified: Secondary | ICD-10-CM | POA: Insufficient documentation

## 2015-12-28 DIAGNOSIS — Z8249 Family history of ischemic heart disease and other diseases of the circulatory system: Secondary | ICD-10-CM | POA: Diagnosis not present

## 2015-12-28 HISTORY — PX: CARDIAC CATHETERIZATION: SHX172

## 2015-12-28 SURGERY — LEFT HEART CATH AND CORONARY ANGIOGRAPHY
Anesthesia: LOCAL

## 2015-12-28 MED ORDER — LIDOCAINE HCL (PF) 1 % IJ SOLN
INTRAMUSCULAR | Status: AC
  Filled 2015-12-28: qty 30

## 2015-12-28 MED ORDER — SODIUM CHLORIDE 0.9 % WEIGHT BASED INFUSION: 1.0000 mL/kg/h | INTRAVENOUS | Status: DC

## 2015-12-28 MED ORDER — FENTANYL CITRATE (PF) 100 MCG/2ML IJ SOLN
INTRAMUSCULAR | Status: AC
  Filled 2015-12-28: qty 2

## 2015-12-28 MED ORDER — IOPAMIDOL (ISOVUE-370) INJECTION 76%
INTRAVENOUS | Status: DC | PRN
  Administered 2015-12-28: 70 mL via INTRAVENOUS

## 2015-12-28 MED ORDER — SODIUM CHLORIDE 0.9 % IV SOLN: 250.0000 mL | INTRAVENOUS | Status: DC | PRN

## 2015-12-28 MED ORDER — ACETAMINOPHEN 325 MG PO TABS: 650.0000 mg | ORAL_TABLET | ORAL | Status: DC | PRN

## 2015-12-28 MED ORDER — FENTANYL CITRATE (PF) 100 MCG/2ML IJ SOLN
INTRAMUSCULAR | Status: DC | PRN
  Administered 2015-12-28: 50 ug via INTRAVENOUS

## 2015-12-28 MED ORDER — SODIUM CHLORIDE 0.9% FLUSH: 3.0000 mL | INTRAVENOUS | Status: DC | PRN

## 2015-12-28 MED ORDER — MIDAZOLAM HCL 2 MG/2ML IJ SOLN
INTRAMUSCULAR | Status: DC | PRN
  Administered 2015-12-28: 1 mg via INTRAVENOUS

## 2015-12-28 MED ORDER — VERAPAMIL HCL 2.5 MG/ML IV SOLN
INTRAVENOUS | Status: AC
  Filled 2015-12-28: qty 2

## 2015-12-28 MED ORDER — MIDAZOLAM HCL 2 MG/2ML IJ SOLN
INTRAMUSCULAR | Status: AC
  Filled 2015-12-28: qty 2

## 2015-12-28 MED ORDER — HEPARIN (PORCINE) IN NACL 2-0.9 UNIT/ML-% IJ SOLN
INTRAMUSCULAR | Status: DC | PRN
  Administered 2015-12-28: 1500 mL

## 2015-12-28 MED ORDER — IOPAMIDOL (ISOVUE-370) INJECTION 76%
INTRAVENOUS | Status: AC
  Filled 2015-12-28: qty 100

## 2015-12-28 MED ORDER — HEPARIN SODIUM (PORCINE) 1000 UNIT/ML IJ SOLN
INTRAMUSCULAR | Status: DC | PRN
  Administered 2015-12-28: 4000 [IU] via INTRAVENOUS

## 2015-12-28 MED ORDER — ATORVASTATIN CALCIUM 20 MG PO TABS: 20.0000 mg | ORAL_TABLET | Freq: Every day | ORAL | 5 refills | Status: DC

## 2015-12-28 MED ORDER — LIDOCAINE HCL (PF) 1 % IJ SOLN
INTRAMUSCULAR | Status: DC | PRN
  Administered 2015-12-28: 2 mL

## 2015-12-28 MED ORDER — SODIUM CHLORIDE 0.9 % WEIGHT BASED INFUSION
3.0000 mL/kg/h | INTRAVENOUS | Status: AC
  Administered 2015-12-28: 3 mL/kg/h via INTRAVENOUS

## 2015-12-28 MED ORDER — VERAPAMIL HCL 2.5 MG/ML IV SOLN
INTRAVENOUS | Status: DC | PRN
  Administered 2015-12-28: 10 mL via INTRA_ARTERIAL

## 2015-12-28 MED ORDER — HEPARIN (PORCINE) IN NACL 2-0.9 UNIT/ML-% IJ SOLN
INTRAMUSCULAR | Status: AC
  Filled 2015-12-28: qty 500

## 2015-12-28 MED ORDER — ONDANSETRON HCL 4 MG/2ML IJ SOLN: 4.0000 mg | Freq: Four times a day (QID) | INTRAMUSCULAR | Status: DC | PRN

## 2015-12-28 MED ORDER — ASPIRIN 81 MG PO CHEW: 81.0000 mg | CHEWABLE_TABLET | ORAL | Status: DC

## 2015-12-28 MED ORDER — SODIUM CHLORIDE 0.9% FLUSH: 3.0000 mL | Freq: Two times a day (BID) | INTRAVENOUS | Status: DC

## 2015-12-28 MED ORDER — HEPARIN (PORCINE) IN NACL 2-0.9 UNIT/ML-% IJ SOLN
INTRAMUSCULAR | Status: AC
  Filled 2015-12-28: qty 1000

## 2015-12-28 SURGICAL SUPPLY — 8 items
CATH IMPULSE 5F ANG/FL3.5 (CATHETERS) ×1 IMPLANT
DEVICE RAD COMP TR BAND LRG (VASCULAR PRODUCTS) ×1 IMPLANT
GLIDESHEATH SLEND A-KIT 6F 22G (SHEATH) ×1 IMPLANT
KIT HEART LEFT (KITS) ×2 IMPLANT
PACK CARDIAC CATHETERIZATION (CUSTOM PROCEDURE TRAY) ×2 IMPLANT
TRANSDUCER W/STOPCOCK (MISCELLANEOUS) ×2 IMPLANT
TUBING CIL FLEX 10 FLL-RA (TUBING) ×2 IMPLANT
WIRE SAFE-T 1.5MM-J .035X260CM (WIRE) ×1 IMPLANT

## 2015-12-28 NOTE — Discharge Instructions (Signed)
Radial Site Care °Refer to this sheet in the next few weeks. These instructions provide you with information about caring for yourself after your procedure. Your health care provider may also give you more specific instructions. Your treatment has been planned according to current medical practices, but problems sometimes occur. Call your health care provider if you have any problems or questions after your procedure. °WHAT TO EXPECT AFTER THE PROCEDURE °After your procedure, it is typical to have the following: °· Bruising at the radial site that usually fades within 1-2 weeks. °· Blood collecting in the tissue (hematoma) that may be painful to the touch. It should usually decrease in size and tenderness within 1-2 weeks. °HOME CARE INSTRUCTIONS °· Take medicines only as directed by your health care provider. °· You may shower 24-48 hours after the procedure or as directed by your health care provider. Remove the bandage (dressing) and gently wash the site with plain soap and water. Pat the area dry with a clean towel. Do not rub the site, because this may cause bleeding. °· Do not take baths, swim, or use a hot tub until your health care provider approves. °· Check your insertion site every day for redness, swelling, or drainage. °· Do not apply powder or lotion to the site. °· Do not flex or bend the affected arm for 24 hours or as directed by your health care provider. °· Do not push or pull heavy objects with the affected arm for 24 hours or as directed by your health care provider. °· Do not lift over 10 lb (4.5 kg) for 5 days after your procedure or as directed by your health care provider. °· Ask your health care provider when it is okay to: °¨ Return to work or school. °¨ Resume usual physical activities or sports. °¨ Resume sexual activity. °· Do not drive home if you are discharged the same day as the procedure. Have someone else drive you. °· You may drive 24 hours after the procedure unless otherwise  instructed by your health care provider. °· Do not operate machinery or power tools for 24 hours after the procedure. °· If your procedure was done as an outpatient procedure, which means that you went home the same day as your procedure, a responsible adult should be with you for the first 24 hours after you arrive home. °· Keep all follow-up visits as directed by your health care provider. This is important. °SEEK MEDICAL CARE IF: °· You have a fever. °· You have chills. °· You have increased bleeding from the radial site. Hold pressure on the site. °SEEK IMMEDIATE MEDICAL CARE IF: °· You have unusual pain at the radial site. °· You have redness, warmth, or swelling at the radial site. °· You have drainage (other than a small amount of blood on the dressing) from the radial site. °· The radial site is bleeding, and the bleeding does not stop after 30 minutes of holding steady pressure on the site. °· Your arm or hand becomes pale, cool, tingly, or numb. °  °This information is not intended to replace advice given to you by your health care provider. Make sure you discuss any questions you have with your health care provider. °  °Document Released: 06/14/2010 Document Revised: 06/02/2014 Document Reviewed: 11/28/2013 °Elsevier Interactive Patient Education ©2016 [employer] ° °

## 2015-12-28 NOTE — H&P (View-Only) (Signed)
Cardiology Office Note Date:  12/25/2015  Patient ID:  Cameron [name], Cameron [name] 1940-07-06, MRN [id number] PCP:  [name], MD  Electrophysiologist:  [name]   Chief Complaint:  Test results  History of Present Illness: Cameron [name] is a 75 y.o. male with history of CHB w/PPM, RA, HTN, hypothyroidism, recently 12/03/15 by [name] for a pacer visit mentioning to him some chest discomfort he had been getting.  He comes today to be seen for [name] to discuss test results.  The patient is feeling well.  He has not had any symptoms since his visit with [name].  He described what he thought may be indigestion, though not necessarily food/meal related, happened a few times for a couple days then none further.  He had no associated symptoms, no palpitations or SOB, no dizziness, near syncope or syncope.  He has felt well since the visit with [name].  He has rx for s/l NTG but had not had further symptoms or felt the need to use it.  Past Medical History:  Diagnosis Date  . BPH (benign prostatic hypertrophy)   . 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 Hypertension   . Hypothyroidism   . Rheumatoid arthritis(714.0)     Past Surgical History:  Procedure Laterality Date  . PACEMAKER INSERTION  12/09/11   SJM Accent DR RF implanted by DR Allred for complete heart block and syncope  . PERMANENT PACEMAKER INSERTION N/A 12/09/2011   Procedure: PERMANENT PACEMAKER INSERTION;  Surgeon: [name], MD;  Location: [facility] CATH LAB;  Service: Cardiovascular;  Laterality: N/A;    Current Outpatient Prescriptions  Medication Sig Dispense Refill  . aspirin EC 81 MG tablet Take 81 mg by mouth daily.    . Coenzyme Q10 (CO Q 10) 100 MG CAPS Take 100 mg by mouth daily.    . DHA-EPA-Vitamin E (OMEGA-3 COMPLEX PO) Take 2 tablets by mouth daily.    . hydroxychloroquine (PLAQUENIL) 200 MG tablet Take 1 tablet by mouth 2 (two) times daily.    Marland Kitchen leflunomide (ARAVA) 10 MG tablet Take 1 tablet  by mouth daily.    Marland Kitchen levothyroxine (SYNTHROID, LEVOTHROID) 125 MCG tablet Take 125 mcg by mouth daily.    Marland Kitchen MAGNESIUM PO Take 1 tablet by mouth every other day.    . mirabegron ER (MYRBETRIQ) 50 MG TB24 tablet Take 1 tablet by mouth daily.    . nebivolol (BYSTOLIC) 5 MG tablet Take 5 mg by mouth daily.    . nitroGLYCERIN (NITROSTAT) 0.4 MG SL tablet Place 1 tablet (0.4 mg total) under the tongue every 5 (five) minutes as needed for chest pain. 25 tablet 3  . Omega-3 Fatty Acids (OMEGA 3 PO) Take 1 tablet by mouth daily. Omega 3 XL    . Tamsulosin HCl (FLOMAX) 0.4 MG CAPS Take 0.4 mg by mouth daily.    Marland Kitchen thyroid (ARMOUR) 90 MG tablet Take 90 mg by mouth daily.    . Turmeric (CURCUMIN 95 PO) Take 1 tablet by mouth daily.     No current facility-administered medications for this visit.     Allergies:   Celecoxib   Social History:  The patient  reports that he has never smoked. He has quit using smokeless tobacco. He reports that he drinks alcohol. He reports that he does not use drugs.   Family History:  The patient's family history includes Alzheimer's disease in his paternal grandmother and sister; Arthritis in his maternal  grandfather; Bone cancer in his father; Heart attack in his paternal uncle; Lung cancer in his paternal grandfather; Pancreatic cancer in his mother.  ROS:  Please see the history of present illness. All other systems are reviewed and otherwise negative.   PHYSICAL EXAM:  VS:  BP 140/67   Pulse 67   Ht 5' 8" (1.727 m)   Wt 192 lb (87.1 kg)   SpO2 95%   BMI 29.19 kg/m  BMI: Body mass index is 29.19 kg/m. Well nourished, well developed, in no acute distress  HEENT: normocephalic, atraumatic  Neck: no JVD, carotid bruits or masses Cardiac: RRR; 1-2/6 SM, no rubs, or gallops Lungs:  clear to auscultation bilaterally, no wheezing, rhonchi or rales  Abd: soft, nontender MS: no deformity or atrophy Ext: no edema  Skin: warm and dry, no rash Neuro:  No gross  deficits appreciated Psych: euthymic mood, full affect  PPM site is stable, no tethering or discomfort   EKG:  Done today shows AV paced, PVCs PPM interrogation today shows normal function, PMTs noted, PVARP was extended slightly, AMS episodes noted longest 16 seconds since last check  12/18/15: Echocardiogram Study Conclusions - Left ventricle: The cavity size was normal. There was mild focal   basal hypertrophy of the septum. Systolic function was mildly   reduced. The estimated ejection fraction was in the range of 45%   to 50%. There is hypokinesis of the mid-apical anteroseptal   myocardium with asynchronous contraction (bundle branch block/   paced). Doppler parameters are consistent with abnormal left   ventricular relaxation (grade 1 diastolic dysfunction). Doppler   parameters are consistent with high ventricular filling pressure. - Aortic valve: Trileaflet; mildly thickened, mildly calcified   leaflets. - Mitral valve: Calcified annulus. Mildly thickened leaflets .   There was moderate regurgitation. - Left atrium: The atrium was moderately dilated. - Right ventricle: The cavity size was mildly dilated. Wall   thickness was normal. Pacer wire or catheter noted in right   ventricle. - Tricuspid valve: There was moderate regurgitation. - Pulmonary arteries: Systolic pressure was moderately increased.   PA peak pressure: 51 mm Hg (S).  12/18/15: Leane Call Study Highlights   Nuclear stress EF: 38%.  There was no ST segment deviation noted during stress.  No T wave inversion was noted during stress.  Defect 1: There is a small defect of moderate severity present in the apical inferior and apex location.  Findings consistent with ischemia.  This is an intermediate risk study.  The left ventricular ejection fraction is moderately decreased (30-44%).     Recent Labs: No results found for requested labs within last 8760 hours.  No results found for requested  labs within last 8760 hours.   CrCl cannot be calculated (Patient's most recent lab result is older than the maximum 21 days allowed.).   Wt Readings from Last 3 Encounters:  12/25/15 192 lb (87.1 kg)  12/03/15 189 lb 3.2 oz (85.8 kg)  11/29/14 196 lb 12.8 oz (89.3 kg)     Other studies reviewed: Additional studies/records reviewed today include: summarized above  ASSESSMENT AND PLAN:  1. Complete heart block Normal pacemaker is intact See Pace Art report No changes were made PMT noted however, no VA conduction was noted  2. HTN Stable  3. nonsustained atach/afib     Will need to follow burden of atrial arrhythmias.      If his burden increases then anticoagulation may be considered  4. Mod mitral regurgitation  5. Chest discomfort      Both typical and atypical features, none since his last visit.     Newly reduced EF with WMA on his echo and abnormal stress myoview     [name] recommends cardiac cath, poss PCI if indicated     discussed cardiac cath procedure, possible PCI, risks/benefits and the patient would like to proceed.     He is instructed to avoid vigorous exercise/actvities until after his cath/findings     Discussed s/l NTG, how/when to use, and if recurrent CP to seek medical attention  Disposition: BMET/CBC, coags for pre-procedure, will schedule cardiac cath/possible PCI and post procedure visit pending findings.  Current medicines are reviewed at length with the patient today.  The patient did not have any concerns regarding medicines.  Haywood Lasso, PA-C 12/25/2015 5:02 PM     [place]  [place] [address] [phone] (office)  [phone] (fax)

## 2015-12-28 NOTE — Interval H&P Note (Signed)
History and Physical Interval Note:  12/28/2015 9:58 AM Cath Lab Visit (complete for each Cath Lab visit)  Clinical Evaluation Leading to the Procedure:   ACS: No.  Non-ACS:    Anginal Classification: CCS II  Anti-ischemic medical therapy: Minimal Therapy (1 class of medications)  Non-Invasive Test Results: Intermediate-risk stress test findings: cardiac mortality 1-3%/year  Prior CABG: No previous CABG        Cameron [name]  has presented today for surgery, with the diagnosis of chest pain abn stress  The various methods of treatment have been discussed with the patient and family. After consideration of risks, benefits and other options for treatment, the patient has consented to  Procedure(s): Left Heart Cath and Coronary Angiography (N/A) as a surgical intervention .  The patient's history has been reviewed, patient examined, no change in status, stable for surgery.  I have reviewed the patient's chart and labs.  Questions were answered to the patient's satisfaction.     Cameron [name]

## 2015-12-31 ENCOUNTER — Encounter: Payer: Self-pay | Admitting: Internal Medicine

## 2016-01-30 ENCOUNTER — Encounter: Payer: Self-pay | Admitting: Internal Medicine

## 2016-01-30 ENCOUNTER — Ambulatory Visit (INDEPENDENT_AMBULATORY_CARE_PROVIDER_SITE_OTHER): Payer: Medicare Other | Admitting: Internal Medicine

## 2016-01-30 VITALS — BP 150/70 | HR 62 | Ht 68.0 in | Wt 191.4 lb

## 2016-01-30 DIAGNOSIS — I25119 Atherosclerotic heart disease of native coronary artery with unspecified angina pectoris: Secondary | ICD-10-CM | POA: Diagnosis not present

## 2016-01-30 DIAGNOSIS — I480 Paroxysmal atrial fibrillation: Secondary | ICD-10-CM | POA: Diagnosis not present

## 2016-01-30 DIAGNOSIS — I442 Atrioventricular block, complete: Secondary | ICD-10-CM

## 2016-01-30 DIAGNOSIS — I10 Essential (primary) hypertension: Secondary | ICD-10-CM | POA: Diagnosis not present

## 2016-01-30 LAB — CUP PACEART INCLINIC DEVICE CHECK
Battery Remaining Longevity: 76.8
Battery Voltage: 2.92 V
Brady Statistic RA Percent Paced: 96 %
Brady Statistic RV Percent Paced: 99.52 %
Date Time Interrogation Session: 20170710180024
Implantable Lead Implant Date: 20130716
Implantable Lead Implant Date: 20130716
Implantable Lead Location: 753859
Implantable Lead Location: 753860
Implantable Lead Model: 1948
Lead Channel Impedance Value: 337.5 Ohm
Lead Channel Impedance Value: 600 Ohm
Lead Channel Pacing Threshold Amplitude: 0.5 V
Lead Channel Pacing Threshold Amplitude: 0.625 V
Lead Channel Pacing Threshold Pulse Width: 0.5 ms
Lead Channel Pacing Threshold Pulse Width: 0.5 ms
Lead Channel Sensing Intrinsic Amplitude: 1.8 mV
Lead Channel Setting Pacing Amplitude: 0.875
Lead Channel Setting Pacing Amplitude: 1.625
Lead Channel Setting Pacing Pulse Width: 0.5 ms
Lead Channel Setting Sensing Sensitivity: 4 mV
Pulse Gen Model: 2210
Pulse Gen Serial Number: 7356500

## 2016-01-30 NOTE — Progress Notes (Signed)
PCP: [name], MD  Cameron [name] is a 75 y.o. male who presents today for routine followup.  Recent cath reviewed.  Denies any further chest pain.   Today, he denies symptoms of palpitations,  shortness of breath,  lower extremity edema, presyncope, or syncope.  The patient is otherwise without complaint today.   Past Medical History:  Diagnosis Date  . BPH (benign prostatic hypertrophy)   . 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 Hypertension   . Hypothyroidism   . Rheumatoid arthritis(714.0)    Past Surgical History:  Procedure Laterality Date  . CARDIAC CATHETERIZATION N/A 12/28/2015   Procedure: Left Heart Cath and Coronary Angiography;  Surgeon: Belva Crome, MD;  Location: [facility] LAB;  Service: Cardiovascular;  Laterality: N/A;  . PACEMAKER INSERTION  12/09/11   SJM Accent DR RF implanted by DR Megan Presti for complete heart block and syncope  . PERMANENT PACEMAKER INSERTION N/A 12/09/2011   Procedure: PERMANENT PACEMAKER INSERTION;  Surgeon: [name], MD;  Location: [facility] CATH LAB;  Service: Cardiovascular;  Laterality: N/A;    Current Outpatient Prescriptions  Medication Sig Dispense Refill  . aspirin EC 81 MG tablet Take 81 mg by mouth daily.    . Coenzyme Q10 (CO Q 10) 100 MG CAPS Take 100 mg by mouth daily.    . DHA-EPA-Vitamin E (OMEGA-3 COMPLEX PO) Take 2 tablets by mouth daily.    . finasteride (PROSCAR) 5 MG tablet Take 5 mg by mouth daily.    . hydroxychloroquine (PLAQUENIL) 200 MG tablet Take 1 tablet by mouth daily.     Marland Kitchen ketoconazole (NIZORAL) 2 % cream Apply 1 application topically daily.    Marland Kitchen leflunomide (ARAVA) 10 MG tablet Take 1 tablet by mouth daily.    Marland Kitchen levothyroxine (SYNTHROID, LEVOTHROID) 125 MCG tablet Take 125 mcg by mouth daily.    Marland Kitchen MAGNESIUM PO Take 1 tablet by mouth daily.     . mirabegron ER (MYRBETRIQ) 50 MG TB24 tablet Take 1 tablet by mouth daily.    . nebivolol (BYSTOLIC) 5 MG tablet Take 5 mg by mouth daily.    . nitroGLYCERIN  (NITROSTAT) 0.4 MG SL tablet Place 1 tablet (0.4 mg total) under the tongue every 5 (five) minutes as needed for chest pain. 25 tablet 3  . Omega-3 Fatty Acids (OMEGA 3 PO) Take 1 tablet by mouth daily. Omega 3 XL    . Polyethylene Glycol 400 (BLINK TEARS OP) Place 1 drop into both eyes daily as needed (redness).    . Soft Lens Products (RENU SALINE) SOLN Place 1 drop into both eyes daily as needed (dry eyes).    . Tamsulosin HCl (FLOMAX) 0.4 MG CAPS Take 0.4 mg by mouth daily.    . TERBINAFINE HCL EX Apply 1 application topically daily as needed (fungus on toenails).    Marland Kitchen thyroid (ARMOUR) 90 MG tablet Take 90 mg by mouth daily.    . Turmeric (CURCUMIN 95 PO) Take 1 tablet by mouth daily.    Marland Kitchen atorvastatin (LIPITOR) 20 MG tablet Take 1 tablet (20 mg total) by mouth daily. (Patient not taking: Reported on 01/30/2016) 30 tablet 5   No current facility-administered medications for this visit.     Physical Exam: Vitals:   01/30/16 1619  BP: (!) 150/70  Pulse: 62  SpO2: 98%  Weight: 191 lb 6.4 oz (86.8 kg)  Height: _0  (1.727 m)    GEN- The patient is well appearing, alert and  oriented x 3 today.   Head- normocephalic, atraumatic Eyes-  Sclera clear, conjunctiva pink Ears- hearing intact Oropharynx- clear Lungs- Clear to ausculation bilaterally, normal work of breathing Chest- pacemaker pocket is well healed Heart- Regular rate and rhythm, no murmurs, rubs or gallops, PMI not laterally displaced GI- soft, NT, ND, + BS Extremities- no clubbing, cyanosis, or edema  Pacemaker interrogation- reviewed in detail today,  See PACEART report  Assessment and Plan:  1. Complete heart block Normal pacemaker function See Pace Art report No changes today  2. HTN Elevated He does not wish to make changes today  3. nonsustained atach/afib Will need to follow burden of atrial arrhythmias.  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  Medical therapy advised by Dr [name].  He has not started atorvastatin.  He says recent lipids were "very low" and doesn't think that he should take a statin.  I have asked that he have results faxed to my office so that I can comment further.  5. Elevated glucose BS 151 on prior blood work. I have asked that he have Dr Doy Hutching obtain Hb A1C.  Merlin compliant Return to see me in 6 months  [name] MD, [facility] 01/30/2016 5:31 PM

## 2016-01-30 NOTE — Patient Instructions (Signed)
Medication Instructions:  Your physician recommends that you continue on your current medications as directed. Please refer to the Current Medication list given to you today.   Labwork: None ordered   Testing/Procedures: None ordered   Follow-Up: Your physician wants you to follow-up in: 6 months with Dr [name] Dennis Bast will receive a reminder letter in the mail two months in advance. If you don't receive a letter, please call our office to schedule the follow-up appointment.  Remote monitoring is used to monitor your Pacemaker  from home. This monitoring reduces the number of office visits required to check your device to one time per year. It allows [place] to keep an eye on the functioning of your device to ensure it is working properly. You are scheduled for a device check from home on 04/30/16. You may send your transmission at any time that day. If you have a wireless device, the transmission will be sent automatically. After your physician reviews your transmission, you will receive a postcard with your next transmission date.     Any Other Special Instructions Will Be Listed Below (If Applicable).     If you need a refill on your cardiac medications before your next appointment, please call your pharmacy.

## 2016-02-05 ENCOUNTER — Encounter: Payer: Self-pay | Admitting: Internal Medicine

## 2016-04-23 ENCOUNTER — Encounter: Payer: Self-pay | Admitting: *Deleted

## 2016-04-23 ENCOUNTER — Telehealth: Payer: Self-pay | Admitting: *Deleted

## 2016-04-23 NOTE — Telephone Encounter (Signed)
Pt's wife, [name] states pt needs a refill of the topical fungal treatment from a compound Pharmacy. Informed [place] and she states pt has refills and she will call him.

## 2016-04-30 ENCOUNTER — Encounter: Payer: Medicare Other | Admitting: *Deleted

## 2016-04-30 ENCOUNTER — Telehealth: Payer: Self-pay | Admitting: Cardiology

## 2016-04-30 NOTE — Telephone Encounter (Signed)
Spoke with pt and reminded pt of remote transmission that is due today. Pt verbalized understanding.   

## 2016-05-02 ENCOUNTER — Other Ambulatory Visit: Payer: Self-pay | Admitting: *Deleted

## 2016-05-02 MED ORDER — NITROGLYCERIN 0.4 MG SL SUBL: 0.4000 mg | SUBLINGUAL_TABLET | SUBLINGUAL | 1 refills | Status: DC | PRN

## 2016-05-05 ENCOUNTER — Ambulatory Visit (INDEPENDENT_AMBULATORY_CARE_PROVIDER_SITE_OTHER): Payer: Medicare Other | Admitting: *Deleted

## 2016-05-05 DIAGNOSIS — I442 Atrioventricular block, complete: Secondary | ICD-10-CM | POA: Diagnosis not present

## 2016-05-13 NOTE — Progress Notes (Signed)
Remote pacemaker transmission.   

## 2016-05-14 ENCOUNTER — Encounter: Payer: Self-pay | Admitting: Cardiology

## 2016-05-29 LAB — CUP PACEART REMOTE DEVICE CHECK
Battery Remaining Longevity: 74 mo
Battery Remaining Percentage: 65 %
Battery Voltage: 2.9 V
Brady Statistic AP VP Percent: 94 %
Brady Statistic AP VS Percent: 1 %
Brady Statistic AS VP Percent: 3.8 %
Brady Statistic AS VS Percent: 1 %
Brady Statistic RA Percent Paced: 92 %
Brady Statistic RV Percent Paced: 98 %
Date Time Interrogation Session: 20171209084104
Implantable Lead Implant Date: 20130716
Implantable Lead Implant Date: 20130716
Implantable Lead Location: 753859
Implantable Lead Location: 753860
Implantable Lead Model: 1948
Implantable Pulse Generator Implant Date: 20130716
Lead Channel Impedance Value: 360 Ohm
Lead Channel Impedance Value: 580 Ohm
Lead Channel Pacing Threshold Amplitude: 0.625 V
Lead Channel Pacing Threshold Amplitude: 1 V
Lead Channel Pacing Threshold Pulse Width: 0.5 ms
Lead Channel Pacing Threshold Pulse Width: 0.5 ms
Lead Channel Sensing Intrinsic Amplitude: 1.7 mV
Lead Channel Sensing Intrinsic Amplitude: 12 mV
Lead Channel Setting Pacing Amplitude: 1.25 V
Lead Channel Setting Pacing Amplitude: 1.625
Lead Channel Setting Pacing Pulse Width: 0.5 ms
Lead Channel Setting Sensing Sensitivity: 4 mV
Pulse Gen Model: 2210
Pulse Gen Serial Number: 7356500

## 2016-07-30 ENCOUNTER — Other Ambulatory Visit: Payer: Self-pay

## 2016-07-30 ENCOUNTER — Ambulatory Visit (INDEPENDENT_AMBULATORY_CARE_PROVIDER_SITE_OTHER): Payer: Medicare Other | Admitting: Internal Medicine

## 2016-07-30 ENCOUNTER — Encounter: Payer: Self-pay | Admitting: Internal Medicine

## 2016-07-30 ENCOUNTER — Encounter: Payer: Self-pay | Admitting: *Deleted

## 2016-07-30 VITALS — BP 130/72 | HR 81 | Ht 67.0 in | Wt 191.1 lb

## 2016-07-30 DIAGNOSIS — I480 Paroxysmal atrial fibrillation: Secondary | ICD-10-CM

## 2016-07-30 DIAGNOSIS — I442 Atrioventricular block, complete: Secondary | ICD-10-CM | POA: Diagnosis not present

## 2016-07-30 DIAGNOSIS — I10 Essential (primary) hypertension: Secondary | ICD-10-CM

## 2016-07-30 DIAGNOSIS — Z950 Presence of cardiac pacemaker: Secondary | ICD-10-CM | POA: Diagnosis not present

## 2016-07-30 DIAGNOSIS — I519 Heart disease, unspecified: Secondary | ICD-10-CM | POA: Diagnosis not present

## 2016-07-30 LAB — CUP PACEART INCLINIC DEVICE CHECK
Battery Voltage: 2.9 V
Brady Statistic RA Percent Paced: 94 %
Brady Statistic RV Percent Paced: 99 %
Date Time Interrogation Session: 20180307142510
Implantable Lead Implant Date: 20130716
Implantable Lead Implant Date: 20130716
Implantable Lead Location: 753859
Implantable Lead Location: 753860
Implantable Lead Model: 1948
Implantable Pulse Generator Implant Date: 20130716
Lead Channel Impedance Value: 337.5 Ohm
Lead Channel Impedance Value: 587.5 Ohm
Lead Channel Pacing Threshold Amplitude: 0.5 V
Lead Channel Pacing Threshold Amplitude: 0.75 V
Lead Channel Pacing Threshold Pulse Width: 0.5 ms
Lead Channel Pacing Threshold Pulse Width: 0.5 ms
Lead Channel Sensing Intrinsic Amplitude: 12 mV
Lead Channel Sensing Intrinsic Amplitude: 2.3 mV
Lead Channel Setting Pacing Amplitude: 1 V
Lead Channel Setting Pacing Amplitude: 1.5 V
Lead Channel Setting Pacing Pulse Width: 0.5 ms
Lead Channel Setting Sensing Sensitivity: 4 mV
Pulse Gen Model: 2210
Pulse Gen Serial Number: 7356500

## 2016-07-30 MED ORDER — LISINOPRIL 5 MG PO TABS: 5.0000 mg | ORAL_TABLET | Freq: Every day | ORAL | 3 refills | Status: DC

## 2016-07-30 NOTE — Patient Instructions (Signed)
Medication Instructions:  Your physician has recommended you make the following change in your medication:  1) Start Lisinopril 5 mg daily   Labwork: None ordered   Testing/Procedures: None ordered   Follow-Up: Remote monitoring is used to monitor your Pacemaker from home. This monitoring reduces the number of office visits required to check your device to one time per year. It allows [place] to keep an eye on the functioning of your device to ensure it is working properly. You are scheduled for a device check from home on 10/29/16. You may send your transmission at any time that day. If you have a wireless device, the transmission will be sent automatically. After your physician reviews your transmission, you will receive a postcard with your next transmission date.  Your physician wants you to follow-up in: 12 months with Dr Vallery Ridge will receive a reminder letter in the mail two months in advance. If you don't receive a letter, please call our office to schedule the follow-up appointment.     Any Other Special Instructions Will Be Listed Below (If Applicable).     If you need a refill on your cardiac medications before your next appointment, please call your pharmacy.

## 2016-07-30 NOTE — Progress Notes (Signed)
PCP: [name], MD  Cameron [name] is a 76 y.o. male who presents today for routine followup.   Remains active.  Plans to go to [place] this spring.   Today, he denies symptoms of palpitations,  shortness of breath,  lower extremity edema, presyncope, or syncope.  The patient is otherwise without complaint today.   Past Medical History:  Diagnosis Date  . BPH (benign prostatic hypertrophy)   . CAD (coronary artery disease) 12/2015   Cath by Dr [name] reveals distal and small vessel CAD.  Medical therapy advised.  . 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 Hypertension   . Hypothyroidism   . Rheumatoid arthritis(714.0)    Past Surgical History:  Procedure Laterality Date  . CARDIAC CATHETERIZATION N/A 12/28/2015   Procedure: Left Heart Cath and Coronary Angiography;  Surgeon: Belva Crome, MD;  Location: [facility];  Service: Cardiovascular;  Laterality: N/A;  . PACEMAKER INSERTION  12/09/11   SJM Accent DR RF implanted by DR Chanette Demo for complete heart block and syncope  . PERMANENT PACEMAKER INSERTION N/A 12/09/2011   Procedure: PERMANENT PACEMAKER INSERTION;  Surgeon: [name], MD;  Location: [facility] C[facility];  Service: Cardiovascular;  Laterality: N/A;    Current Outpatient Prescriptions  Medication Sig Dispense Refill  . aspirin EC 81 MG tablet Take 81 mg by mouth daily.    Marland Kitchen atorvastatin (LIPITOR) 20 MG tablet Take 1 tablet (20 mg total) by mouth daily. 30 tablet 5  . Coenzyme Q10 (CO Q 10) 100 MG CAPS Take 100 mg by mouth daily.    . DHA-EPA-Vitamin E (OMEGA-3 COMPLEX PO) Take 2 tablets by mouth daily.    . finasteride (PROSCAR) 5 MG tablet Take 5 mg by mouth daily.    Marland Kitchen ketoconazole (NIZORAL) 2 % cream Apply 1 application topically daily.    Marland Kitchen leflunomide (ARAVA) 10 MG tablet Take 1 tablet by mouth daily.    Marland Kitchen levothyroxine (SYNTHROID, LEVOTHROID) 125 MCG tablet Take 125 mcg by mouth daily.    Marland Kitchen MAGNESIUM PO Take 1 tablet by mouth daily.     . metFORMIN  (GLUCOPHAGE) 500 MG tablet Take 500 mg by mouth 2 (two) times daily.    . mirabegron ER (MYRBETRIQ) 50 MG TB24 tablet Take 1 tablet by mouth daily.    . nebivolol (BYSTOLIC) 5 MG tablet Take 5 mg by mouth daily.    . nitroGLYCERIN (NITROSTAT) 0.4 MG SL tablet Place 1 tablet (0.4 mg total) under the tongue every 5 (five) minutes as needed for chest pain. 75 tablet 1  . NON FORMULARY     . NONFORMULARY OR COMPOUNDED ITEM [facility]: Onychomycosis Nail Lacquer - Fluconazole 2%, Terbinafine 1%, DMSO, apply 1-2 grams to affected area daily.    . Omega-3 Fatty Acids (OMEGA 3 PO) Take 1 tablet by mouth daily. Omega 3 XL    . Polyethylene Glycol 400 (BLINK TEARS OP) Place 1 drop into both eyes daily as needed (redness).    . Soft Lens Products (RENU SALINE) SOLN Place 1 drop into both eyes daily as needed (dry eyes).    . Tamsulosin HCl (FLOMAX) 0.4 MG CAPS Take 0.4 mg by mouth daily.    . TERBINAFINE HCL EX Apply 1 application topically daily as needed (fungus on toenails).    Marland Kitchen thyroid (ARMOUR) 90 MG tablet Take 90 mg by mouth daily.    . Turmeric (CURCUMIN 95 PO) Take 1 tablet by mouth daily.  No current facility-administered medications for this visit.     Physical Exam: Vitals:   07/30/16 1100  BP: 130/72  Pulse: 81  SpO2: 95%  Weight: 191 lb 2 oz (86.7 kg)  Height: _0  (1.702 m)    GEN- The patient is well appearing, alert and oriented x 3 today.   Head- normocephalic, atraumatic Eyes-  Sclera clear, conjunctiva pink Ears- hearing intact Oropharynx- clear Lungs- Clear to ausculation bilaterally, normal work of breathing Chest- pacemaker pocket is well healed Heart- Regular rate and rhythm, no murmurs, rubs or gallops, PMI not laterally displaced GI- soft, NT, ND, + BS Extremities- no clubbing, cyanosis, or edema  Pacemaker interrogation- reviewed in detail today,  See PACEART report  Assessment and Plan:  1. Complete heart block Normal pacemaker function See Pace  Art report No changes today  2. HTN Will add lisinopril 39m daily.  PCP to titrate lisinopril as able  3. nonsustained atach/afib Will need to follow burden of atrial arrhythmias.  If his burden increases then anticoagulation may be considered  4. 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

## 2016-08-11 ENCOUNTER — Encounter: Payer: Self-pay | Admitting: Internal Medicine

## 2016-08-19 ENCOUNTER — Other Ambulatory Visit: Payer: Self-pay | Admitting: Internal Medicine

## 2016-09-09 ENCOUNTER — Inpatient Hospital Stay
Admission: EM | Admit: 2016-09-09 | Discharge: 2016-09-15 | DRG: 683 | Disposition: A | Discharge status: Home / Self Care | Payer: Medicare Other | Attending: Internal Medicine | Admitting: Internal Medicine

## 2016-09-09 ENCOUNTER — Other Ambulatory Visit: Payer: Self-pay | Admitting: Physician Assistant

## 2016-09-09 ENCOUNTER — Ambulatory Visit
Admission: RE | Admit: 2016-09-09 | Discharge: 2016-09-09 | Disposition: A | Discharge status: Home / Self Care | Payer: Medicare Other | Source: Ambulatory Visit | Attending: Physician Assistant | Admitting: Physician Assistant

## 2016-09-09 DIAGNOSIS — Z87891 Personal history of nicotine dependence: Secondary | ICD-10-CM

## 2016-09-09 DIAGNOSIS — N179 Acute kidney failure, unspecified: Secondary | ICD-10-CM | POA: Diagnosis present

## 2016-09-09 DIAGNOSIS — N200 Calculus of kidney: Secondary | ICD-10-CM

## 2016-09-09 DIAGNOSIS — I110 Hypertensive heart disease with heart failure: Secondary | ICD-10-CM | POA: Diagnosis present

## 2016-09-09 DIAGNOSIS — E039 Hypothyroidism, unspecified: Secondary | ICD-10-CM | POA: Diagnosis present

## 2016-09-09 DIAGNOSIS — R197 Diarrhea, unspecified: Secondary | ICD-10-CM | POA: Diagnosis not present

## 2016-09-09 DIAGNOSIS — Z801 Family history of malignant neoplasm of trachea, bronchus and lung: Secondary | ICD-10-CM | POA: Diagnosis not present

## 2016-09-09 DIAGNOSIS — Z7984 Long term (current) use of oral hypoglycemic drugs: Secondary | ICD-10-CM | POA: Diagnosis not present

## 2016-09-09 DIAGNOSIS — Z7982 Long term (current) use of aspirin: Secondary | ICD-10-CM | POA: Diagnosis not present

## 2016-09-09 DIAGNOSIS — M069 Rheumatoid arthritis, unspecified: Secondary | ICD-10-CM | POA: Diagnosis present

## 2016-09-09 DIAGNOSIS — E785 Hyperlipidemia, unspecified: Secondary | ICD-10-CM | POA: Diagnosis present

## 2016-09-09 DIAGNOSIS — Z96659 Presence of unspecified artificial knee joint: Secondary | ICD-10-CM | POA: Diagnosis present

## 2016-09-09 DIAGNOSIS — I2510 Atherosclerotic heart disease of native coronary artery without angina pectoris: Secondary | ICD-10-CM | POA: Diagnosis present

## 2016-09-09 DIAGNOSIS — M1990 Unspecified osteoarthritis, unspecified site: Secondary | ICD-10-CM | POA: Diagnosis present

## 2016-09-09 DIAGNOSIS — I442 Atrioventricular block, complete: Secondary | ICD-10-CM | POA: Diagnosis present

## 2016-09-09 DIAGNOSIS — Z950 Presence of cardiac pacemaker: Secondary | ICD-10-CM | POA: Diagnosis not present

## 2016-09-09 DIAGNOSIS — R1031 Right lower quadrant pain: Secondary | ICD-10-CM

## 2016-09-09 DIAGNOSIS — K388 Other specified diseases of appendix: Secondary | ICD-10-CM

## 2016-09-09 DIAGNOSIS — Z808 Family history of malignant neoplasm of other organs or systems: Secondary | ICD-10-CM | POA: Diagnosis not present

## 2016-09-09 DIAGNOSIS — I5022 Chronic systolic (congestive) heart failure: Secondary | ICD-10-CM | POA: Diagnosis present

## 2016-09-09 DIAGNOSIS — Z800 Family history of malignant neoplasm of digestive organs: Secondary | ICD-10-CM

## 2016-09-09 DIAGNOSIS — N400 Enlarged prostate without lower urinary tract symptoms: Secondary | ICD-10-CM | POA: Diagnosis present

## 2016-09-09 DIAGNOSIS — A080 Rotaviral enteritis: Secondary | ICD-10-CM | POA: Diagnosis present

## 2016-09-09 DIAGNOSIS — R319 Hematuria, unspecified: Secondary | ICD-10-CM | POA: Diagnosis present

## 2016-09-09 DIAGNOSIS — K37 Unspecified appendicitis: Secondary | ICD-10-CM | POA: Insufficient documentation

## 2016-09-09 DIAGNOSIS — E119 Type 2 diabetes mellitus without complications: Secondary | ICD-10-CM | POA: Diagnosis present

## 2016-09-09 DIAGNOSIS — E860 Dehydration: Secondary | ICD-10-CM | POA: Diagnosis present

## 2016-09-09 DIAGNOSIS — N170 Acute kidney failure with tubular necrosis: Secondary | ICD-10-CM | POA: Diagnosis not present

## 2016-09-09 DIAGNOSIS — A0811 Acute gastroenteropathy due to Norwalk agent: Secondary | ICD-10-CM | POA: Diagnosis not present

## 2016-09-09 DIAGNOSIS — Z820 Family history of epilepsy and other diseases of the nervous system: Secondary | ICD-10-CM | POA: Diagnosis not present

## 2016-09-09 DIAGNOSIS — I700 Atherosclerosis of aorta: Secondary | ICD-10-CM | POA: Insufficient documentation

## 2016-09-09 DIAGNOSIS — Z79899 Other long term (current) drug therapy: Secondary | ICD-10-CM

## 2016-09-09 LAB — POCT I-STAT CREATININE: Creatinine, Ser: 3.6 mg/dL — ABNORMAL HIGH (ref 0.61–1.24)

## 2016-09-09 LAB — GLUCOSE, CAPILLARY: Glucose-Capillary: 154 mg/dL — ABNORMAL HIGH (ref 65–99)

## 2016-09-09 MED ORDER — LEFLUNOMIDE 20 MG PO TABS
10.0000 mg | ORAL_TABLET | Freq: Every day | ORAL | Status: DC
  Administered 2016-09-10 – 2016-09-15 (×6): 10 mg via ORAL
  Filled 2016-09-09 (×6): qty 1

## 2016-09-09 MED ORDER — DIFLUPREDNATE 0.05 % OP EMUL
1.0000 [drp] | Freq: Three times a day (TID) | OPHTHALMIC | Status: DC
  Administered 2016-09-10 – 2016-09-14 (×13): 1 [drp] via OPHTHALMIC
  Filled 2016-09-09: qty 5

## 2016-09-09 MED ORDER — PIPERACILLIN-TAZOBACTAM 3.375 G IVPB 30 MIN
3.3750 g | Freq: Once | INTRAVENOUS | Status: AC
  Administered 2016-09-09: 3.375 g via INTRAVENOUS
  Filled 2016-09-09: qty 50

## 2016-09-09 MED ORDER — INSULIN ASPART 100 UNIT/ML ~~LOC~~ SOLN
0.0000 [IU] | Freq: Three times a day (TID) | SUBCUTANEOUS | Status: DC
  Administered 2016-09-11 (×2): 1 [IU] via SUBCUTANEOUS
  Administered 2016-09-12: 2 [IU] via SUBCUTANEOUS
  Administered 2016-09-12: 1 [IU] via SUBCUTANEOUS
  Administered 2016-09-13 – 2016-09-14 (×3): 2 [IU] via SUBCUTANEOUS
  Administered 2016-09-14: 1 [IU] via SUBCUTANEOUS
  Administered 2016-09-14: 2 [IU] via SUBCUTANEOUS
  Filled 2016-09-09 (×2): qty 2
  Filled 2016-09-09: qty 1
  Filled 2016-09-09: qty 2
  Filled 2016-09-09: qty 1
  Filled 2016-09-09 (×2): qty 2
  Filled 2016-09-09: qty 1
  Filled 2016-09-09: qty 2

## 2016-09-09 MED ORDER — FINASTERIDE 5 MG PO TABS
5.0000 mg | ORAL_TABLET | Freq: Every day | ORAL | Status: DC
  Administered 2016-09-10 – 2016-09-15 (×6): 5 mg via ORAL
  Filled 2016-09-09 (×6): qty 1

## 2016-09-09 MED ORDER — HYPROMELLOSE (GONIOSCOPIC) 2.5 % OP SOLN
Freq: Every day | OPHTHALMIC | Status: DC | PRN
  Filled 2016-09-09: qty 15

## 2016-09-09 MED ORDER — CIPROFLOXACIN HCL 500 MG PO TABS
500.0000 mg | ORAL_TABLET | Freq: Two times a day (BID) | ORAL | Status: AC
  Administered 2016-09-09 – 2016-09-12 (×6): 500 mg via ORAL
  Filled 2016-09-09 (×7): qty 1

## 2016-09-09 MED ORDER — THYROID 60 MG PO TABS
90.0000 mg | ORAL_TABLET | Freq: Every day | ORAL | Status: DC
  Administered 2016-09-10 – 2016-09-15 (×6): 90 mg via ORAL
  Filled 2016-09-09 (×6): qty 1

## 2016-09-09 MED ORDER — NITROGLYCERIN 0.4 MG SL SUBL: 0.4000 mg | SUBLINGUAL_TABLET | SUBLINGUAL | Status: DC | PRN

## 2016-09-09 MED ORDER — SODIUM CHLORIDE 0.9 % IV SOLN
Freq: Once | INTRAVENOUS | Status: AC
  Administered 2016-09-09: 18:00:00 via INTRAVENOUS

## 2016-09-09 MED ORDER — ACETAMINOPHEN 650 MG RE SUPP: 650.0000 mg | Freq: Four times a day (QID) | RECTAL | Status: DC | PRN

## 2016-09-09 MED ORDER — BESIFLOXACIN HCL 0.6 % OP SUSP
1.0000 [drp] | Freq: Three times a day (TID) | OPHTHALMIC | Status: DC
  Administered 2016-09-10 – 2016-09-15 (×16): 1 [drp] via OPHTHALMIC
  Filled 2016-09-09: qty 5

## 2016-09-09 MED ORDER — ONDANSETRON HCL 4 MG/2ML IJ SOLN
4.0000 mg | Freq: Four times a day (QID) | INTRAMUSCULAR | Status: DC | PRN
Start: 2016-09-09 — End: 2016-09-15
  Administered 2016-09-10 – 2016-09-15 (×9): 4 mg via INTRAVENOUS
  Filled 2016-09-09 (×10): qty 2

## 2016-09-09 MED ORDER — NON FORMULARY: 1.0000 [drp] | Freq: Three times a day (TID) | Status: DC

## 2016-09-09 MED ORDER — OXYCODONE HCL 5 MG PO TABS
5.0000 mg | ORAL_TABLET | ORAL | Status: DC | PRN
  Administered 2016-09-10 – 2016-09-14 (×2): 5 mg via ORAL
  Filled 2016-09-09 (×3): qty 1

## 2016-09-09 MED ORDER — ACETAMINOPHEN 325 MG PO TABS
650.0000 mg | ORAL_TABLET | Freq: Four times a day (QID) | ORAL | Status: DC | PRN
  Administered 2016-09-14 (×2): 650 mg via ORAL
  Filled 2016-09-09 (×2): qty 2

## 2016-09-09 MED ORDER — KETOCONAZOLE 2 % EX CREA
1.0000 "application " | TOPICAL_CREAM | Freq: Every day | CUTANEOUS | Status: DC | PRN
  Filled 2016-09-09: qty 15

## 2016-09-09 MED ORDER — MIRABEGRON ER 50 MG PO TB24
50.0000 mg | ORAL_TABLET | Freq: Every day | ORAL | Status: DC
Start: 2016-09-10 — End: 2016-09-15
  Administered 2016-09-10 – 2016-09-15 (×6): 50 mg via ORAL
  Filled 2016-09-09 (×7): qty 1

## 2016-09-09 MED ORDER — ONDANSETRON HCL 4 MG/2ML IJ SOLN
4.0000 mg | Freq: Once | INTRAMUSCULAR | Status: AC
  Administered 2016-09-09: 4 mg via INTRAVENOUS
  Filled 2016-09-09: qty 2

## 2016-09-09 MED ORDER — NON FORMULARY: 1.0000 [drp] | Freq: Every day | Status: DC

## 2016-09-09 MED ORDER — RENU SALINE SOLN: 1.0000 [drp] | Freq: Every day | Status: DC | PRN

## 2016-09-09 MED ORDER — MORPHINE SULFATE (PF) 2 MG/ML IV SOLN
2.0000 mg | Freq: Once | INTRAVENOUS | Status: AC
  Administered 2016-09-09: 2 mg via INTRAVENOUS
  Filled 2016-09-09: qty 1

## 2016-09-09 MED ORDER — BROMFENAC SODIUM 0.07 % OP SOLN
1.0000 [drp] | Freq: Every day | OPHTHALMIC | Status: DC
  Administered 2016-09-10 – 2016-09-15 (×5): 1 [drp] via OPHTHALMIC
  Filled 2016-09-09: qty 1

## 2016-09-09 MED ORDER — ATORVASTATIN CALCIUM 20 MG PO TABS
20.0000 mg | ORAL_TABLET | Freq: Every day | ORAL | Status: DC
  Administered 2016-09-10 – 2016-09-15 (×6): 20 mg via ORAL
  Filled 2016-09-09 (×6): qty 1

## 2016-09-09 MED ORDER — INSULIN ASPART 100 UNIT/ML ~~LOC~~ SOLN: 0.0000 [IU] | Freq: Every day | SUBCUTANEOUS | Status: DC

## 2016-09-09 MED ORDER — NEBIVOLOL HCL 5 MG PO TABS
5.0000 mg | ORAL_TABLET | Freq: Every day | ORAL | Status: DC
  Administered 2016-09-10 – 2016-09-15 (×6): 5 mg via ORAL
  Filled 2016-09-09 (×6): qty 1

## 2016-09-09 MED ORDER — TAMSULOSIN HCL 0.4 MG PO CAPS
0.4000 mg | ORAL_CAPSULE | Freq: Every day | ORAL | Status: DC
  Administered 2016-09-10 – 2016-09-15 (×6): 0.4 mg via ORAL
  Filled 2016-09-09 (×6): qty 1

## 2016-09-09 MED ORDER — PROMETHAZINE HCL 25 MG/ML IJ SOLN
12.5000 mg | Freq: Once | INTRAMUSCULAR | Status: AC
  Administered 2016-09-09: 12.5 mg via INTRAVENOUS
  Filled 2016-09-09: qty 1

## 2016-09-09 MED ORDER — SODIUM CHLORIDE 0.9 % IV SOLN
INTRAVENOUS | Status: DC
  Administered 2016-09-10 – 2016-09-12 (×5): via INTRAVENOUS

## 2016-09-09 NOTE — ED Triage Notes (Signed)
Pt sent from outpatient CT by [facility]'s office with a positive CT for appendicitis. Pt c/o RLQ pain for the past 3 days with diarrhea.Cameron [name]

## 2016-09-09 NOTE — Progress Notes (Signed)
Pharmacy Antibiotic Note  Cameron [name] is a 76 y.o. male admitted on 09/09/2016 with AKI and travelers diarrhea.  Pharmacy has been consulted for ciprofloxacin dosing for travelers.  Plan: Will order ciprofloxacin 569m  Twice daily for 3 days   Height: _0  (170.2 cm) Weight: 184 lb (83.5 kg) IBW/kg (Calculated) : 66.1  Temp (24hrs), Avg:97.7 F (36.5 C), Min:97.7 F (36.5 C), Max:97.7 F (36.5 C)   Recent Labs Lab 09/09/16 1557  CREATININE 3.60*    Estimated Creatinine Clearance: 18 mL/min (A) (by C-G formula based on SCr of 3.6 mg/dL (H)).    Allergies  Allergen Reactions  . Celecoxib     unknown     Thank you for allowing pharmacy to be a part of this patient's care.  SPernell Dupre PharmD, BCPS Clinical Pharmacist 09/09/2016 7:49 PM

## 2016-09-09 NOTE — Consult Note (Signed)
Date of Consultation:  09/09/2016  Requesting Physician:  Nance Pear, MD  Reason for Consultation:  Abdominal pain  History of Present Illness: Cameron [name] is a 76 y.o. male who presents with abdominal pain and diarrhea.  The patient reports that he was out of the country in [place] earlier in April and returned on 4/15.  During that trip, he had significant gastroenteritis with multiple bouts of diarrhea and some stool incontinence, associated with right lower quadrant pain.  Things seemed to improve prior to the end of the trip, but then again on 4/15 started having abdominal pain int he same location and with diarrhea as well.  He did have an episode of emesis today.  Due to the on going pain, he presented to his PCP for further evaluation.  He had labs drawn outside which showed a WBC of 15.7 and in acute renal failure with creatinine of 3.3.  CT scan was obtained which showed inflammatory changes in the right lower quadrant small bowel, mesentery, as well an an inflammed appendix concerning for acute appendicitis.  Past Medical History: Past Medical History:  Diagnosis Date  . BPH (benign prostatic hypertrophy)   . CAD (coronary artery disease) 12/2015   Cath by Dr [name] reveals distal and small vessel CAD.  Medical therapy advised.  . 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 Diabetes mellitus without complication ([place])   . Hypertension   . Hypothyroidism   . Rheumatoid arthritis(714.0)      Past Surgical History: Past Surgical History:  Procedure Laterality Date   Laparoscopic cholecystectomy Right inguinal hernia repair    . CARDIAC CATHETERIZATION N/A 12/28/2015   Procedure: Left Heart Cath and Coronary Angiography;  Surgeon: Belva Crome, MD;  Location: [facility];  Service: Cardiovascular;  Laterality: N/A;  . PACEMAKER INSERTION  12/09/11   SJM Accent DR RF implanted by DR Allred for complete heart block and syncope  . PERMANENT PACEMAKER INSERTION N/A  12/09/2011   Procedure: PERMANENT PACEMAKER INSERTION;  Surgeon: [name], MD;  Location[facility];  Service: Cardiovascular;  Laterality: N/A;    Home Medications: Prior to Admission medications   Medication Sig Start Date End Date Taking? Authorizing Provider  aspirin EC 81 MG tablet Take 81 mg by mouth daily.   Yes Historical Provider, MD  atorvastatin (LIPITOR) 20 MG tablet Take 1 tablet (20 mg total) by mouth daily. 12/28/15  Yes Christopher End, MD  Coenzyme Q10 (CO Q 10) 100 MG CAPS Take 100 mg by mouth daily.   Yes Historical Provider, MD  finasteride (PROSCAR) 5 MG tablet Take 5 mg by mouth daily. 11/08/15  Yes Historical Provider, MD  ketoconazole (NIZORAL) 2 % cream Apply 1 application topically daily. 11/28/15  Yes Historical Provider, MD  leflunomide (ARAVA) 10 MG tablet Take 1 tablet by mouth daily. 07/20/14  Yes Historical Provider, MD  lisinopril (PRINIVIL,ZESTRIL) 5 MG tablet Take 1 tablet (5 mg total) by mouth daily. 07/30/16 10/28/16 Yes [name], MD  MAGNESIUM PO Take 1 tablet by mouth daily.    Yes Historical Provider, MD  metFORMIN (GLUCOPHAGE) 500 MG tablet Take 500 mg by mouth 2 (two) times daily. 07/18/16 07/18/17 Yes Historical Provider, MD  mirabegron ER (MYRBETRIQ) 50 MG TB24 tablet Take 1 tablet by mouth daily. 06/29/14  Yes Historical Provider, MD  nebivolol (BYSTOLIC) 5 MG tablet Take 5 mg by mouth daily.   Yes Historical Provider, MD  nitroGLYCERIN (NITROSTAT) 0.4 MG SL  tablet Place 1 tablet (0.4 mg total) under the tongue every 5 (five) minutes as needed for chest pain. 05/02/16  Yes [name], MD  NONFORMULARY OR COMPOUNDED [place]: Onychomycosis Nail Lacquer - Fluconazole 2%, Terbinafine 1%, DMSO, apply 1-2 grams to affected area daily.   Yes Historical Provider, MD  Omega-3 Fatty Acids (OMEGA 3 PO) Take 1 tablet by mouth 2 (two) times daily. Omega 3 XL    Yes Historical Provider, MD  Polyethylene Glycol 400 (BLINK TEARS OP) Place 1 drop into both  eyes daily as needed (redness).   Yes Historical Provider, MD  Soft Lens Products (RENU SALINE) SOLN Place 1 drop into both eyes daily as needed (dry eyes).   Yes Historical Provider, MD  Tamsulosin HCl (FLOMAX) 0.4 MG CAPS Take 0.4 mg by mouth daily.   Yes Historical Provider, MD  TERBINAFINE HCL EX Apply 1 application topically daily as needed (fungus on toenails).   Yes Historical Provider, MD  thyroid (ARMOUR) 90 MG tablet Take 90 mg by mouth daily.   Yes Historical Provider, MD  Turmeric (CURCUMIN 95 PO) Take 1 tablet by mouth daily.   Yes Historical Provider, MD    Allergies: Allergies  Allergen Reactions  . Celecoxib     unknown     Social History:  reports that he has never smoked. He has quit using smokeless tobacco. He reports that he drinks alcohol. He reports that he does not use drugs.   Family History: Family History  Problem Relation Age of Onset  . Pancreatic cancer Mother   . Bone cancer Father   . Alzheimer's disease Sister   . Heart attack Paternal Uncle   . Arthritis Maternal Grandfather   . Alzheimer's disease Paternal Grandmother   . Lung cancer Paternal Grandfather     Review of Systems: Review of Systems  Constitutional: Negative for chills and fever.  HENT: Negative for hearing loss.   Eyes: Negative for blurred vision.  Respiratory: Negative for cough and shortness of breath.   Cardiovascular: Negative for chest pain and leg swelling.  Gastrointestinal: Positive for abdominal pain, diarrhea, nausea and vomiting.  Genitourinary: Negative for dysuria and hematuria.  Musculoskeletal: Negative for myalgias.  Skin: Negative for rash.  Neurological: Negative for dizziness.  Psychiatric/Behavioral: Negative for depression.  All other systems reviewed and are negative.   Physical Exam BP (!) 134/50 (BP Location: Right Arm)   Pulse 62   Temp 97.7 F (36.5 C) (Oral)   Resp 18   Ht _0  (1.702 m)   Wt 83.5 kg (184 lb)   SpO2 98%   BMI 28.82  kg/m  CONSTITUTIONAL: No acute distress HEENT:  Normocephalic, atraumatic, extraocular motion intact. NECK: Trachea is midline, and there is no jugular venous distension. RESPIRATORY:  Lungs are clear, and breath sounds are equal bilaterally. Normal respiratory effort without pathologic use of accessory muscles. CARDIOVASCULAR: Heart is regular without murmurs, gallops, or rubs. GI: The abdomen is soft, nondistended, with tenderness to palpation in the right side and right lower quadrant.  No peritoneal signs. MUSCULOSKELETAL:  Normal muscle strength and tone in all four extremities.  No peripheral edema or cyanosis. SKIN: Skin turgor is normal. There are no pathologic skin lesions.  NEUROLOGIC:  Motor and sensation is grossly normal.  Cranial nerves are grossly intact. PSYCH:  Alert and oriented to person, place and time. Affect is normal.  Laboratory Analysis: Results for orders placed or performed during the hospital encounter of 09/09/16 (from the past  24 hour(s))  I-STAT creatinine     Status: Abnormal   Collection Time: 09/09/16  3:57 PM  Result Value Ref Range   Creatinine, Ser 3.60 (H) 0.61 - 1.24 mg/dL   Outside labs:  WBC 15.7, Hgb 13.6, Plt 195.  Na 138, K 4.6, Cl 101, CO2 27.8, BUN 42, Cr 3.3  Imaging: Ct Abdomen Pelvis Wo Contrast  Result Date: 09/09/2016 CLINICAL DATA:  Right lower quadrant pain and diarrhea for 3 days. EXAM: CT ABDOMEN AND PELVIS WITHOUT CONTRAST TECHNIQUE: Multidetector CT imaging of the abdomen and pelvis was performed following the standard protocol without IV contrast. COMPARISON:  None. FINDINGS: Lower chest: No acute findings. Mild cardiomegaly with pacemaker leads in right heart. Hepatobiliary: No masses visualized on this unenhanced exam. Prior cholecystectomy noted. No evidence of biliary dilatation. Pancreas: No mass or inflammatory process visualized on this unenhanced exam. Spleen:  Within normal limits in size. Adrenals/Urinary tract: A few tiny  1-2 mm bilateral intrarenal calculi are seen. No evidence of ureteral calculi or hydronephrosis. Unremarkable unopacified urinary bladder. Stomach/Bowel: The appendix is enlarged measuring approximately 12 mm in thickness with pericholecystic inflammatory changes. There is also wall thickening involving the distal ileum with adjacent inflammatory changes in the right lower quadrant mesentery. This is likely due to acute appendicitis, with Crohn's disease considered less likely. No evidence of bowel obstruction or abscess. Vascular/Lymphatic: No pathologically enlarged lymph nodes identified. No evidence of abdominal aortic aneurysm. Aortic atherosclerosis. Reproductive:  No mass or other significant abnormality. Other:  None. Musculoskeletal:  No suspicious bone lesions identified. IMPRESSION: Enlarged appendix, distal small bowel wall thickening, and adjacent inflammatory changes in right lower quadrant mesentery. This is likely due to acute appendicitis with secondary involvement of the distal ileum, with Crohn's disease considered a less likely differential diagnosis. No evidence of abscess or bowel obstruction. Tiny nonobstructive bilateral renal calculi. No evidence of ureteral calculi or hydronephrosis. Aortic atherosclerosis. These results will be called to the ordering clinician or representative by the Radiologist Assistant, and communication documented in the PACS or zVision Dashboard. Electronically Signed   By: Earle Gell M.D.   On: 09/09/2016 16:23    Assessment and Plan: This is a 76 y.o. male who presents with abdominal pain and diarrhea.  I have independently viewed the patient's imaging studies and reviewed the patient's laboratory studies.  The patient has inflammatory changes in the right lower quadrant, with an appendix dilated to 1 cm.  Overall the widespread stranding is more significant for the inflammation of the appendix, so it is questionable whether he truly has appendicitis vs severe  gastroenteritis.  The patient reports that his current pain is the same that he had in [place] during his trip.  At this point would recommend admission to the medical team for IV fluid hydration, management of his acute renal failure, management of his complete heart block with EF of 38%.  Recommend starting IV zosyn for coverage for gastroenteritis vs appendicitis.  Will continue following along with abdominal exams.  The patient understands that if his pain does not improve, or his WBC worsens, then he may indeed have appendicitis and may require surgery.  He does understand that this would place more stress on his heart given his current cardiac issues.  Patient understands this plan and all of his questions were answered.   Melvyn Neth, [place]

## 2016-09-09 NOTE — ED Provider Notes (Signed)
[facility] Emergency Department Provider Note    ____________________________________________   I have reviewed the triage vital signs and the nursing notes.   HISTORY  Chief Complaint Abdominal Pain   History limited by: Not Limited   HPI Cameron [name] is a 76 y.o. male who presents to the emergency department today because of concern for appendicitis. This was worked up through his primary care doctors office. The patient started developing lower abdominal pain a couple of days ago. He now states that the pain is primarily in the right lower quadrant. Only started developing vomiting today. Denies any fevers.  Past Medical History:  Diagnosis Date  . BPH (benign prostatic hypertrophy)   . CAD (coronary artery disease) 12/2015   Cath by Dr [name] reveals distal and small vessel CAD.  Medical therapy advised.  . 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 Diabetes mellitus without complication ([place])   . Hypertension   . Hypothyroidism   . Rheumatoid arthritis(714.0)     Patient Active Problem List   Diagnosis Date Noted  . Onychomycosis 12/20/2015  . Chest pain 12/03/2015  . Atrial fibrillation ([place]) 12/12/2013  . 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 Complete heart block ([place]) 12/09/2011  . Hypertension 12/09/2011  . Rheumatoid arthritis(714.0) 12/09/2011    Past Surgical History:  Procedure Laterality Date  . CARDIAC CATHETERIZATION N/A 12/28/2015   Procedure: Left Heart Cath and Coronary Angiography;  Surgeon: Belva Crome, MD;  Location: [place] [facility];  Service: Cardiovascular;  Laterality: N/A;  . PACEMAKER INSERTION  12/09/11   SJM Accent DR RF implanted by DR Allred for complete heart block and syncope  . PERMANENT PACEMAKER INSERTION N/A 12/09/2011   Procedure: PERMANENT PACEMAKER INSERTION;  Surgeon: [name], MD;  Location: [facility] CATH LAB;  Service: Cardiovascular;  Laterality: N/A;    Prior to Admission medications   Medication Sig  Start Date End Date Taking? Authorizing Provider  aspirin EC 81 MG tablet Take 81 mg by mouth daily.    Historical Provider, MD  atorvastatin (LIPITOR) 20 MG tablet Take 1 tablet (20 mg total) by mouth daily. 12/28/15   Nelva Bush, MD  Coenzyme Q10 (CO Q 10) 100 MG CAPS Take 100 mg by mouth daily.    Historical Provider, MD  DHA-EPA-Vitamin E (OMEGA-3 COMPLEX PO) Take 2 tablets by mouth daily.    Historical Provider, MD  finasteride (PROSCAR) 5 MG tablet Take 5 mg by mouth daily. 11/08/15   Historical Provider, MD  ketoconazole (NIZORAL) 2 % cream Apply 1 application topically daily. 11/28/15   Historical Provider, MD  leflunomide (ARAVA) 10 MG tablet Take 1 tablet by mouth daily. 07/20/14   Historical Provider, MD  levothyroxine (SYNTHROID, LEVOTHROID) 125 MCG tablet Take 125 mcg by mouth daily.    Historical Provider, MD  lisinopril (PRINIVIL,ZESTRIL) 5 MG tablet Take 1 tablet (5 mg total) by mouth daily. 07/30/16 10/28/16  [name], MD  MAGNESIUM PO Take 1 tablet by mouth daily.     Historical Provider, MD  metFORMIN (GLUCOPHAGE) 500 MG tablet Take 500 mg by mouth 2 (two) times daily. 07/18/16 07/18/17  Historical Provider, MD  mirabegron ER (MYRBETRIQ) 50 MG TB24 tablet Take 1 tablet by mouth daily. 06/29/14   Historical Provider, MD  nebivolol (BYSTOLIC) 5 MG tablet Take 5 mg by mouth daily.    Historical Provider, MD  nitroGLYCERIN (NITROSTAT) 0.4 MG SL tablet Place 1 tablet (0.4 mg total) under the tongue every 5 (five)  minutes as needed for chest pain. 05/02/16   [name], MD  NON FORMULARY     Historical Provider, MD  NONFORMULARY OR COMPOUNDED [place]: Onychomycosis Nail Lacquer - Fluconazole 2%, Terbinafine 1%, DMSO, apply 1-2 grams to affected area daily.    Historical Provider, MD  Omega-3 Fatty Acids (OMEGA 3 PO) Take 1 tablet by mouth daily. Omega 3 XL    Historical Provider, MD  Polyethylene Glycol 400 (BLINK TEARS OP) Place 1 drop into both eyes daily as needed  (redness).    Historical Provider, MD  Soft Lens Products (RENU SALINE) SOLN Place 1 drop into both eyes daily as needed (dry eyes).    Historical Provider, MD  Tamsulosin HCl (FLOMAX) 0.4 MG CAPS Take 0.4 mg by mouth daily.    Historical Provider, MD  TERBINAFINE HCL EX Apply 1 application topically daily as needed (fungus on toenails).    Historical Provider, MD  thyroid (ARMOUR) 90 MG tablet Take 90 mg by mouth daily.    Historical Provider, MD  Turmeric (CURCUMIN 95 PO) Take 1 tablet by mouth daily.    Historical Provider, MD    Allergies Celecoxib  Family History  Problem Relation Age of Onset  . Pancreatic cancer Mother   . Bone cancer Father   . Alzheimer's disease Sister   . Heart attack Paternal Uncle   . Arthritis Maternal Grandfather   . Alzheimer's disease Paternal Grandmother   . Lung cancer Paternal Grandfather     Social History Social History  Substance Use Topics  . Smoking status: Never Smoker  . Smokeless tobacco: Former Systems developer  . Alcohol use 0.0 oz/week    Review of Systems  Constitutional: Negative for fever. Cardiovascular: Negative for chest pain. Respiratory: Negative for shortness of breath. Gastrointestinal: Positive for abdominal pain and nausea.  Genitourinary: Negative for dysuria. Musculoskeletal: Negative for back pain. Skin: Negative for rash. Neurological: Negative for headaches, focal weakness or numbness.  10-point ROS otherwise negative.  ____________________________________________   PHYSICAL EXAM:  VITAL SIGNS: ED Triage Vitals  Enc Vitals Group     BP 09/09/16 1649 (!) 134/50     Pulse Rate 09/09/16 1649 62     Resp 09/09/16 1649 18     Temp 09/09/16 1649 97.7 F (36.5 C)     Temp Source 09/09/16 1649 Oral     SpO2 09/09/16 1649 98 %     Weight 09/09/16 1650 184 lb (83.5 kg)     Height 09/09/16 1650 _0  (1.702 m)     Head Circumference --      Peak Flow --      Pain Score 09/09/16 1649 6   Constitutional: Alert and  oriented. Well appearing and in no distress. Eyes: Conjunctivae are normal. Normal extraocular movements. ENT   Head: Normocephalic and atraumatic.   Nose: No congestion/rhinnorhea.   Mouth/Throat: Mucous membranes are moist.   Neck: No stridor. Hematological/Lymphatic/Immunilogical: No cervical lymphadenopathy. Cardiovascular: Normal rate, regular rhythm.  No murmurs, rubs, or gallops. Respiratory: Normal respiratory effort without tachypnea nor retractions. Breath sounds are clear and equal bilaterally. No wheezes/rales/rhonchi. Gastrointestinal: Soft and tender to palpation in the right lower quadrant.  Genitourinary: Deferred Musculoskeletal: Normal range of motion in all extremities. No lower extremity edema. Neurologic:  Normal speech and language. No gross focal neurologic deficits are appreciated.  Skin:  Skin is warm, dry and intact. No rash noted. Psychiatric: Mood and affect are normal. Speech and behavior are normal. Patient exhibits appropriate insight and  judgment.  ____________________________________________    LABS (pertinent positives/negatives)  Outside labs reviewed  ____________________________________________   EKG  None  ____________________________________________    RADIOLOGY  CT abd/pel from PCP IMPRESSION: Enlarged appendix, distal small bowel wall thickening, and adjacent inflammatory changes in right lower quadrant mesentery. This is likely due to acute appendicitis with secondary involvement of the distal ileum, with Crohn's disease considered a less likely differential diagnosis. No evidence of abscess or bowel obstruction.  Tiny nonobstructive bilateral renal calculi. No evidence of ureteral calculi or hydronephrosis.  Aortic atherosclerosis.  ____________________________________________   PROCEDURES  Procedures  ____________________________________________   INITIAL IMPRESSION / ASSESSMENT AND PLAN / ED  COURSE  Pertinent labs & imaging results that were available during my care of the patient were reviewed by me and considered in my medical decision making (see chart for details).  Patient presented to the emergency department today because of concerns for appendicitis which was diagnosed through his primary care doctor's office. CT scan ordered from primary care to show acute appendicitis. Additionally patient did have a leukocytosis. Will discuss with surgery.  ____________________________________________   FINAL CLINICAL IMPRESSION(S) / ED DIAGNOSES  Appendicitis  Note: This dictation was prepared with Dragon dictation. Any transcriptional errors that result from this process are unintentional     Nance Pear, MD 09/09/16 1725

## 2016-09-09 NOTE — H&P (Signed)
[place] [facility] [place] [name]    MR#:  [id number]  DATE OF BIRTH:  07/13/1940  DATE OF ADMISSION:  09/09/2016  PRIMARY CARE PHYSICIAN: [name], MD   REQUESTING/REFERRING PHYSICIAN: Dr Archie Balboa  CHIEF COMPLAINT:   Chief Complaint  Patient presents with  . Abdominal Pain    HISTORY OF PRESENT ILLNESS:  Cameron [name]  is a 76 y.o. male with a known history of Rheumatoid arthritis, hypothyroidism, hypertension, CAD and diabetes. He presents with diarrhea and lower abdominal pain. The pain in his abdomen going on for 1-1/2 days. Can be severe down in the lower abdomen. The patient initially developed diarrhea when he was over in [place] and it was pretty bad initially but he got over it. He had cataract surgery on Monday. He has not been doing well since then. He has not been eating. He's lost 10 pounds. He's had this abdominal pain in the lower abdomen with diarrhea. Nothing made it better or worse. Today in the ER he did have nausea and vomiting.  PAST MEDICAL HISTORY:   Past Medical History:  Diagnosis Date  . BPH (benign prostatic hypertrophy)   . CAD (coronary artery disease) 12/2015   Cath by Dr [name] reveals distal and small vessel CAD.  Medical therapy advised.  . 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 Diabetes mellitus without complication ([place])   . Hypertension   . Hypothyroidism   . Rheumatoid arthritis(714.0)     PAST SURGICAL HISTORY:   Past Surgical History:  Procedure Laterality Date  . BACK SURGERY    . CARDIAC CATHETERIZATION N/A 12/28/2015   Procedure: Left Heart Cath and Coronary Angiography;  Surgeon: Belva Crome, MD;  Location: [place] [facility] LAB;  Service: Cardiovascular;  Laterality: N/A;  . cataracts    . PACEMAKER INSERTION  12/09/11   SJM Accent DR RF implanted by DR Allred for complete heart block and syncope  . PERMANENT PACEMAKER INSERTION N/A 12/09/2011   Procedure: PERMANENT  PACEMAKER INSERTION;  Surgeon: [name], MD;  Location: [facility] CATH LAB;  Service: Cardiovascular;  Laterality: N/A;  . REPLACEMENT TOTAL KNEE      SOCIAL HISTORY:   Social History  Substance Use Topics  . Smoking status: Former Research scientist (life sciences)  . Smokeless tobacco: Former Systems developer  . Alcohol use 0.0 oz/week    FAMILY HISTORY:   Family History  Problem Relation Age of Onset  . Pancreatic cancer Mother   . Bone cancer Father   . Alzheimer's disease Sister   . Heart attack Paternal Uncle   . Arthritis Maternal Grandfather   . Alzheimer's disease Paternal Grandmother   . Lung cancer Paternal Grandfather     DRUG ALLERGIES:   Allergies  Allergen Reactions  . Celecoxib     unknown     REVIEW OF SYSTEMS:  CONSTITUTIONAL: No fever, Positive for chills. Positive for fatigue. Positive for weight loss EYES: No blurred or double vision.  EARS, NOSE, AND THROAT: No tinnitus or ear pain. No sore throat. Decreased hearing dysphagia to all things. RESPIRATORY: No cough, positive for shortness of breath, no wheezing or hemoptysis.  CARDIOVASCULAR: No chest pain, orthopnea, edema.  GASTROINTESTINAL: Positive for nausea, vomiting, diarrhea and abdominal pain. No blood in bowel movements GENITOURINARY: No dysuria, hematuria.  ENDOCRINE: No polyuria, nocturia,  HEMATOLOGY: No anemia, easy bruising or bleeding SKIN: No rash or lesion. MUSCULOSKELETAL: No joint pain or arthritis.   NEUROLOGIC: No  tingling, numbness, weakness.  PSYCHIATRY: No anxiety or depression.   MEDICATIONS AT HOME:   Prior to Admission medications   Medication Sig Start Date End Date Taking? Authorizing Provider  aspirin EC 81 MG tablet Take 81 mg by mouth daily.   Yes Historical Provider, MD  atorvastatin (LIPITOR) 20 MG tablet Take 1 tablet (20 mg total) by mouth daily. 12/28/15  Yes Christopher End, MD  Coenzyme Q10 (CO Q 10) 100 MG CAPS Take 100 mg by mouth daily.   Yes Historical Provider, MD  finasteride (PROSCAR) 5 MG  tablet Take 5 mg by mouth daily. 11/08/15  Yes Historical Provider, MD  ketoconazole (NIZORAL) 2 % cream Apply 1 application topically daily. 11/28/15  Yes Historical Provider, MD  leflunomide (ARAVA) 10 MG tablet Take 1 tablet by mouth daily. 07/20/14  Yes Historical Provider, MD  lisinopril (PRINIVIL,ZESTRIL) 5 MG tablet Take 1 tablet (5 mg total) by mouth daily. 07/30/16 10/28/16 Yes [name], MD  MAGNESIUM PO Take 1 tablet by mouth daily.    Yes Historical Provider, MD  metFORMIN (GLUCOPHAGE) 500 MG tablet Take 500 mg by mouth 2 (two) times daily. 07/18/16 07/18/17 Yes Historical Provider, MD  mirabegron ER (MYRBETRIQ) 50 MG TB24 tablet Take 1 tablet by mouth daily. 06/29/14  Yes Historical Provider, MD  nebivolol (BYSTOLIC) 5 MG tablet Take 5 mg by mouth daily.   Yes Historical Provider, MD  nitroGLYCERIN (NITROSTAT) 0.4 MG SL tablet Place 1 tablet (0.4 mg total) under the tongue every 5 (five) minutes as needed for chest pain. 05/02/16  Yes [name], MD  NONFORMULARY OR COMPOUNDED [place]: Onychomycosis Nail Lacquer - Fluconazole 2%, Terbinafine 1%, DMSO, apply 1-2 grams to affected area daily.   Yes Historical Provider, MD  Omega-3 Fatty Acids (OMEGA 3 PO) Take 1 tablet by mouth 2 (two) times daily. Omega 3 XL    Yes Historical Provider, MD  Polyethylene Glycol 400 (BLINK TEARS OP) Place 1 drop into both eyes daily as needed (redness).   Yes Historical Provider, MD  Soft Lens Products (RENU SALINE) SOLN Place 1 drop into both eyes daily as needed (dry eyes).   Yes Historical Provider, MD  Tamsulosin HCl (FLOMAX) 0.4 MG CAPS Take 0.4 mg by mouth daily.   Yes Historical Provider, MD  TERBINAFINE HCL EX Apply 1 application topically daily as needed (fungus on toenails).   Yes Historical Provider, MD  thyroid (ARMOUR) 90 MG tablet Take 90 mg by mouth daily.   Yes Historical Provider, MD  Turmeric (CURCUMIN 95 PO) Take 1 tablet by mouth daily.   Yes Historical Provider, MD      VITAL  SIGNS:  Blood pressure (!) 115/47, pulse 67, temperature 97.7 F (36.5 C), temperature source Oral, resp. rate 18, height _0  (1.702 m), weight 83.5 kg (184 lb), SpO2 98 %.  PHYSICAL EXAMINATION:  GENERAL:  76 y.o.-year-old patient lying in the bed with no acute distress.  EYES: Pupils equal, round, reactive to light and accommodation. No scleral icterus. Extraocular muscles intact.  HEENT: Head atraumatic, normocephalic. Oropharynx and nasopharynx clear.  NECK:  Supple, no jugular venous distention. No thyroid enlargement, no tenderness.  LUNGS: Normal breath sounds bilaterally, no wheezing, rales,rhonchi or crepitation. No use of accessory muscles of respiration.  CARDIOVASCULAR: S1, S2 normal. No murmurs, rubs, or gallops.  ABDOMEN: Soft, nontender, nondistended. Bowel sounds present. No organomegaly or mass.  EXTREMITIES: No pedal edema, cyanosis, or clubbing.  NEUROLOGIC: Cranial nerves II through XII are intact. Muscle  strength 5/5 in all extremities. Sensation intact. Gait not checked.  PSYCHIATRIC: The patient is alert and oriented x 3.  SKIN: No rash, lesion, or ulcer.   LABORATORY PANEL:   Labs reviewed by primary care physician. White blood cell count 15.7 and creatinine 3.3.  Chemistries   Recent Labs Lab 09/09/16 1557  CREATININE 3.60*   ------------------------------------------------------------------------------------------------------------------  Cardiac  RADIOLOGY:  Ct Abdomen Pelvis Wo Contrast  Result Date: 09/09/2016 CLINICAL DATA:  Right lower quadrant pain and diarrhea for 3 days. EXAM: CT ABDOMEN AND PELVIS WITHOUT CONTRAST TECHNIQUE: Multidetector CT imaging of the abdomen and pelvis was performed following the standard protocol without IV contrast. COMPARISON:  None. FINDINGS: Lower chest: No acute findings. Mild cardiomegaly with pacemaker leads in right heart. Hepatobiliary: No masses visualized on this unenhanced exam. Prior cholecystectomy noted. No  evidence of biliary dilatation. Pancreas: No mass or inflammatory process visualized on this unenhanced exam. Spleen:  Within normal limits in size. Adrenals/Urinary tract: A few tiny 1-2 mm bilateral intrarenal calculi are seen. No evidence of ureteral calculi or hydronephrosis. Unremarkable unopacified urinary bladder. Stomach/Bowel: The appendix is enlarged measuring approximately 12 mm in thickness with pericholecystic inflammatory changes. There is also wall thickening involving the distal ileum with adjacent inflammatory changes in the right lower quadrant mesentery. This is likely due to acute appendicitis, with Crohn's disease considered less likely. No evidence of bowel obstruction or abscess. Vascular/Lymphatic: No pathologically enlarged lymph nodes identified. No evidence of abdominal aortic aneurysm. Aortic atherosclerosis. Reproductive:  No mass or other significant abnormality. Other:  None. Musculoskeletal:  No suspicious bone lesions identified. IMPRESSION: Enlarged appendix, distal small bowel wall thickening, and adjacent inflammatory changes in right lower quadrant mesentery. This is likely due to acute appendicitis with secondary involvement of the distal ileum, with Crohn's disease considered a less likely differential diagnosis. No evidence of abscess or bowel obstruction. Tiny nonobstructive bilateral renal calculi. No evidence of ureteral calculi or hydronephrosis. Aortic atherosclerosis. These results will be called to the ordering clinician or representative by the Radiologist Assistant, and communication documented in the PACS or zVision Dashboard. Electronically Signed   By: Earle Gell M.D.   On: 09/09/2016 16:23      IMPRESSION AND PLAN:   1. Acute kidney injury. IV fluid hydration. Stop lisinopril and Glucophage. 2. Acute gastroenteritis. Likely traveler's diarrhea. We'll give a couple doses of Cipro and see what happens. Send off stool studies including C. difficile and ova  and parasites and stool comprehensive panel. Appreciate surgical consultation. Holding off on any surgery for appendix at this time. We will reevaluate tomorrow. 3. Essential hypertension continue bystolic 4. Type 2 diabetes hold Glucophage 5. BPH on finasteride and Flomax 6. Hyperlipidemia unspecified on atorvastatin 7. Rheumatoid arthritis on leflunomide   All the records are reviewed and case discussed with ED provider. Management plans discussed with the patient, family and they are in agreement.  CODE STATUS: Full code  TOTAL TIME TAKING CARE OF THIS PATIENT: 50 minutes.    Loletha Grayer M.D on 09/09/2016 at 7:26 PM  Between 7am to 6pm - Pager - [phone]  After 6pm call admission pager [phone]  [facility]  [phone]  CC: Primary care physician; [name], MD

## 2016-09-10 DIAGNOSIS — R1031 Right lower quadrant pain: Secondary | ICD-10-CM

## 2016-09-10 LAB — GASTROINTESTINAL PANEL BY PCR, STOOL (REPLACES STOOL CULTURE)

## 2016-09-10 LAB — URINALYSIS, COMPLETE (UACMP) WITH MICROSCOPIC
Bacteria, UA: NONE SEEN
Bilirubin Urine: NEGATIVE
Glucose, UA: NEGATIVE mg/dL
Hgb urine dipstick: NEGATIVE
Ketones, ur: NEGATIVE mg/dL
Leukocytes, UA: NEGATIVE
Nitrite: NEGATIVE
Protein, ur: NEGATIVE mg/dL
Specific Gravity, Urine: 1.016 (ref 1.005–1.030)
pH: 5 (ref 5.0–8.0)

## 2016-09-10 LAB — CBC
HCT: 36.5 % — ABNORMAL LOW (ref 40.0–52.0)
Hemoglobin: 12.6 g/dL — ABNORMAL LOW (ref 13.0–18.0)
MCH: 30.9 pg (ref 26.0–34.0)
MCHC: 34.5 g/dL (ref 32.0–36.0)
MCV: 89.5 fL (ref 80.0–100.0)
Platelets: 170 10*3/uL (ref 150–440)
RBC: 4.08 MIL/uL — ABNORMAL LOW (ref 4.40–5.90)
RDW: 14.6 % — ABNORMAL HIGH (ref 11.5–14.5)
WBC: 14.7 10*3/uL — ABNORMAL HIGH (ref 3.8–10.6)

## 2016-09-10 LAB — BASIC METABOLIC PANEL
Anion gap: 6 (ref 5–15)
BUN: 51 mg/dL — ABNORMAL HIGH (ref 6–20)
CO2: 25 mmol/L (ref 22–32)
Calcium: 8.4 mg/dL — ABNORMAL LOW (ref 8.9–10.3)
Chloride: 105 mmol/L (ref 101–111)
Creatinine, Ser: 2.92 mg/dL — ABNORMAL HIGH (ref 0.61–1.24)
GFR calc Af Amer: 23 mL/min — ABNORMAL LOW (ref >60)
GFR calc non Af Amer: 19 mL/min — ABNORMAL LOW (ref >60)
Glucose, Bld: 136 mg/dL — ABNORMAL HIGH (ref 65–99)
Potassium: 4.3 mmol/L (ref 3.5–5.1)
Sodium: 136 mmol/L (ref 135–145)

## 2016-09-10 LAB — GLUCOSE, CAPILLARY
Glucose-Capillary: 117 mg/dL — ABNORMAL HIGH (ref 65–99)
Glucose-Capillary: 109 mg/dL — ABNORMAL HIGH (ref 65–99)
Glucose-Capillary: 96 mg/dL (ref 65–99)

## 2016-09-10 LAB — C DIFFICILE QUICK SCREEN W PCR REFLEX
C Diff antigen: NEGATIVE
C Diff interpretation: NOT DETECTED
C Diff toxin: NEGATIVE

## 2016-09-10 MED ORDER — METRONIDAZOLE 500 MG PO TABS
500.0000 mg | ORAL_TABLET | Freq: Three times a day (TID) | ORAL | Status: DC
  Administered 2016-09-10 – 2016-09-15 (×15): 500 mg via ORAL
  Filled 2016-09-10 (×15): qty 1

## 2016-09-10 NOTE — Progress Notes (Signed)
[place] at [place] [name]    MR#:  [id number]  DATE OF BIRTH:  11-14-1940  SUBJECTIVE: Noted for right lower quadrant abdominal pain and also diarrhea. Traveled to [place] recently. Still has right lower quadrant abdominal pain but it's better than yesterday . 5 out of 10 in severity. No nausea. No further diarrhea. Last BM was last night.   CHIEF COMPLAINT:   Chief Complaint  Patient presents with  . Abdominal Pain    REVIEW OF SYSTEMS:   ROS CONSTITUTIONAL: No fever, fatigue or weakness.  EYES: No blurred or double vision.  EARS, NOSE, AND THROAT: No tinnitus or ear pain.  RESPIRATORY: No cough, shortness of breath, wheezing or hemoptysis.  CARDIOVASCULAR: No chest pain, orthopnea, edema.  GASTROINTESTINAL: No nausea, vomiting, diarrhea. Has right lower quadrant abdominal pain.  GENITOURINARY: No dysuria, hematuria.  ENDOCRINE: No polyuria, nocturia,  HEMATOLOGY: No anemia, easy bruising or bleeding SKIN: No rash or lesion. MUSCULOSKELETAL: No joint pain or arthritis.   NEUROLOGIC: No tingling, numbness, weakness.  PSYCHIATRY: No anxiety or depression.   DRUG ALLERGIES:   Allergies  Allergen Reactions  . Celecoxib     unknown     VITALS:  Blood pressure 136/64, pulse 72, temperature 98.4 F (36.9 C), temperature source Oral, resp. rate 20, height _0  (1.702 m), weight 83.5 kg (184 lb), SpO2 95 %.  PHYSICAL EXAMINATION:  GENERAL:  76 y.o.-year-old patient lying in the bed with no acute distress.  EYES: Pupils equal, round, reactive to light and accommodation. No scleral icterus. Extraocular muscles intact.  HEENT: Head atraumatic, normocephalic. Oropharynx and nasopharynx clear.  NECK:  Supple, no jugular venous distention. No thyroid enlargement, no tenderness.  LUNGS: Normal breath sounds bilaterally, no wheezing, rales,rhonchi or crepitation. No use of accessory muscles of respiration.   CARDIOVASCULAR: S1, S2 normal. No murmurs, rubs, or gallops.  ABDOMEN: Tenderness in the right lower quadrant. Bowel sounds present no organomegaly  EXTREMITIES: No pedal edema, cyanosis, or clubbing.  NEUROLOGIC: Cranial nerves II through XII are intact. Muscle strength 5/5 in all extremities. Sensation intact. Gait not checked.  PSYCHIATRIC: The patient is alert and oriented x 3.  SKIN: No obvious rash, lesion, or ulcer.    LABORATORY PANEL:   CBC  Recent Labs Lab 09/10/16 0517  WBC 14.7*  HGB 12.6*  HCT 36.5*  PLT 170   ------------------------------------------------------------------------------------------------------------------  Chemistries   Recent Labs Lab 09/10/16 0517  NA 136  K 4.3  CL 105  CO2 25  GLUCOSE 136*  BUN 51*  CREATININE 2.92*  CALCIUM 8.4*   ------------------------------------------------------------------------------------------------------------------  Cardiac Enzymes No results for input(s): TROPONINI in the last 168 hours. ------------------------------------------------------------------------------------------------------------------  RADIOLOGY:  Ct Abdomen Pelvis Wo Contrast  Result Date: 09/09/2016 CLINICAL DATA:  Right lower quadrant pain and diarrhea for 3 days. EXAM: CT ABDOMEN AND PELVIS WITHOUT CONTRAST TECHNIQUE: Multidetector CT imaging of the abdomen and pelvis was performed following the standard protocol without IV contrast. COMPARISON:  None. FINDINGS: Lower chest: No acute findings. Mild cardiomegaly with pacemaker leads in right heart. Hepatobiliary: No masses visualized on this unenhanced exam. Prior cholecystectomy noted. No evidence of biliary dilatation. Pancreas: No mass or inflammatory process visualized on this unenhanced exam. Spleen:  Within normal limits in size. Adrenals/Urinary tract: A few tiny 1-2 mm bilateral intrarenal calculi are seen. No evidence of ureteral calculi or hydronephrosis. Unremarkable  unopacified urinary bladder. Stomach/Bowel: The appendix is enlarged measuring approximately 12 mm in  thickness with pericholecystic inflammatory changes. There is also wall thickening involving the distal ileum with adjacent inflammatory changes in the right lower quadrant mesentery. This is likely due to acute appendicitis, with Crohn's disease considered less likely. No evidence of bowel obstruction or abscess. Vascular/Lymphatic: No pathologically enlarged lymph nodes identified. No evidence of abdominal aortic aneurysm. Aortic atherosclerosis. Reproductive:  No mass or other significant abnormality. Other:  None. Musculoskeletal:  No suspicious bone lesions identified. IMPRESSION: Enlarged appendix, distal small bowel wall thickening, and adjacent inflammatory changes in right lower quadrant mesentery. This is likely due to acute appendicitis with secondary involvement of the distal ileum, with Crohn's disease considered a less likely differential diagnosis. No evidence of abscess or bowel obstruction. Tiny nonobstructive bilateral renal calculi. No evidence of ureteral calculi or hydronephrosis. Aortic atherosclerosis. These results will be called to the ordering clinician or representative by the Radiologist Assistant, and communication documented in the PACS or zVision Dashboard. Electronically Signed   By: Earle Gell M.D.   On: 09/09/2016 16:23    EKG:   Orders placed or performed in visit on 12/25/15  . EKG 12-Lead    ASSESSMENT AND PLAN:  #1 right lower quadrant pain with acute appendicitis: Conservative management with IV fluids, IV antibiotics and IV pain medicines and watch closely, resolving. Patient has history of chronic systolic heart failure,  h/o of pacemaker placement so family is not keen on surgery anyway. Closely, continue Cipro, Flagyl #2 Norvo virus diarrhea: Continue to treatment, continue IV hydration, #3 acute kidney injury due to dehydration and ATN secondary to  dehydration: Continue IV hydration, avoid nephrotoxic agents, metformin, hold ACE inhibitors.  continue IV hydration, monitor kidney function closely.  D/w wife and pt,pts RN All the records are reviewed and case discussed with Care Management/Social Workerr. Management plans discussed with the patient, family and they are in agreement.  CODE STATUS: full  TOTAL TIME TAKING CARE OF THIS PATIENT: 35 minutes.   POSSIBLE D/C IN 1-2DAYS, DEPENDING ON CLINICAL CONDITION.   Epifanio Lesches M.D on 09/10/2016 at 12:38 PM  Between 7am to 6pm - Pager - [phone]  After 6pm go to [email or web address] - password [email or web address] [facility]  Office  [phone]  CC: Primary care physician; [name], MD   Note: This dictation was prepared with Dragon dictation along with smaller phrase technology. Any transcriptional errors that result from this process are unintentional.

## 2016-09-10 NOTE — Progress Notes (Signed)
Initial Nutrition Assessment  DOCUMENTATION CODES:   Severe malnutrition in context of acute illness/injury  INTERVENTION:  1. Ensure Enlive po BID, each supplement provides 350 kcal and 20 grams of protein w/ diet advancement  NUTRITION DIAGNOSIS:   Malnutrition related to acute illness as evidenced by energy intake < or equal to 50% for > or equal to 5 days, percent weight loss.  GOAL:   Patient will meet greater than or equal to 90% of their needs  MONITOR:   I & O's, Diet advancement, Labs, Weight trends, Supplement acceptance  REASON FOR ASSESSMENT:   Malnutrition Screening Tool    ASSESSMENT:   Cameron [name]  is a 76 y.o. male with a known history of Rheumatoid arthritis, hypothyroidism, hypertension, CAD and diabetes. He presents with diarrhea and lower abdominal pain.   Spoke with Mr. Zeimet at bedside. He is currently NPO on IV antibiotics with acute appendicitis Reports losing 10# over the past 2-3 weeks r/t nausea/diarrhea/vomiting and now acute appendicitis, nausea, and pain - Patient hasn't eaten anything during this time.  Prior to that, PO intake was great, patient eats 3 meals per day, he and wife workout 3 times per week. Nutrition-Focused physical exam completed. Findings are no fat depletion, no muscle depletion, and no edema  Monitor for advancement Encouraged protein intake, oral nutrition supplement use during hospitalization and following hospitalization for healing, muscle retention.  Labs and medications reviewed: NS @ 63m/hr  Diet Order:     Skin:  Reviewed, no issues  Last BM:  09/09/2016  Height:   Ht Readings from Last 1 Encounters:  09/09/16 _0  (1.702 m)    Weight:   Wt Readings from Last 1 Encounters:  09/09/16 184 lb (83.5 kg)    Ideal Body Weight:  67.27 kg  BMI:  Body mass index is 28.82 kg/m.  Estimated Nutritional Needs:   Kcal:  1700-2100 calories  Protein:  84-100 gm  Fluid:  >/= 1.7L  EDUCATION NEEDS:    No education needs identified at this time  WSatira Anis Louanna Vanliew, MS, RD LDN Inpatient Clinical Dietitian Pager [phone]

## 2016-09-10 NOTE — Progress Notes (Signed)
09/10/2016  Subjective: No acute events overnight. Patient reports that he feels that his pain has improved. No episodes of diarrhea overnight.  Vital signs: Temp:  [97.7 F (36.5 C)-98.5 F (36.9 C)] 98.4 F (36.9 C) (04/18 0459) Pulse Rate:  [62-72] 72 (04/18 0805) Resp:  [18-24] 20 (04/18 0805) BP: (115-136)/(43-64) 136/64 (04/18 0805) SpO2:  [91 %-98 %] 95 % (04/18 0805) Weight:  [83.5 kg (184 lb)] 83.5 kg (184 lb) (04/17 1650)   Intake/Output: 04/17 0701 - 04/18 0700 In: 783.6 [I.V.:733.6; IV Piggyback:50] Out: -  Last BM Date: 09/09/16  Physical Exam: Constitutional: No acute distress Abdomen:  Soft, mildly distended, with improved tenderness to palpation the right lower quadrant. There is no guarding or peritoneal signs at this point.  Labs:   Recent Labs  09/10/16 0517  WBC 14.7*  HGB 12.6*  HCT 36.5*  PLT 170    Recent Labs  09/09/16 1557 09/10/16 0517  NA  --  136  K  --  4.3  CL  --  105  CO2  --  25  GLUCOSE  --  136*  BUN  --  51*  CREATININE 3.60* 2.92*  CALCIUM  --  8.4*   No results for input(s): LABPROT, INR in the last 72 hours.  Imaging: Ct Abdomen Pelvis Wo Contrast  Result Date: 09/09/2016 CLINICAL DATA:  Right lower quadrant pain and diarrhea for 3 days. EXAM: CT ABDOMEN AND PELVIS WITHOUT CONTRAST TECHNIQUE: Multidetector CT imaging of the abdomen and pelvis was performed following the standard protocol without IV contrast. COMPARISON:  None. FINDINGS: Lower chest: No acute findings. Mild cardiomegaly with pacemaker leads in right heart. Hepatobiliary: No masses visualized on this unenhanced exam. Prior cholecystectomy noted. No evidence of biliary dilatation. Pancreas: No mass or inflammatory process visualized on this unenhanced exam. Spleen:  Within normal limits in size. Adrenals/Urinary tract: A few tiny 1-2 mm bilateral intrarenal calculi are seen. No evidence of ureteral calculi or hydronephrosis. Unremarkable unopacified urinary  bladder. Stomach/Bowel: The appendix is enlarged measuring approximately 12 mm in thickness with pericholecystic inflammatory changes. There is also wall thickening involving the distal ileum with adjacent inflammatory changes in the right lower quadrant mesentery. This is likely due to acute appendicitis, with Crohn's disease considered less likely. No evidence of bowel obstruction or abscess. Vascular/Lymphatic: No pathologically enlarged lymph nodes identified. No evidence of abdominal aortic aneurysm. Aortic atherosclerosis. Reproductive:  No mass or other significant abnormality. Other:  None. Musculoskeletal:  No suspicious bone lesions identified. IMPRESSION: Enlarged appendix, distal small bowel wall thickening, and adjacent inflammatory changes in right lower quadrant mesentery. This is likely due to acute appendicitis with secondary involvement of the distal ileum, with Crohn's disease considered a less likely differential diagnosis. No evidence of abscess or bowel obstruction. Tiny nonobstructive bilateral renal calculi. No evidence of ureteral calculi or hydronephrosis. Aortic atherosclerosis. These results will be called to the ordering clinician or representative by the Radiologist Assistant, and communication documented in the PACS or zVision Dashboard. Electronically Signed   By: Earle Gell M.D.   On: 09/09/2016 16:23    Assessment/Plan: 76 year old male with gastroenteritis versus appendicitis.  -Patient's white blood cell count has improved and his abdominal pain is improved. Currently there are no acute surgical needs and would recommend continuing current therapy with IV fluid hydration as well as antibiotics. Have added Flagyl for anaerobic coverage as well. -Pending stool studies though he has not had a bout of diarrhea overnight. -Have started the patient on  ice chips and sips of water for today. -We'll continue following along with you.   Melvyn Neth, [place]

## 2016-09-11 DIAGNOSIS — A0811 Acute gastroenteropathy due to Norwalk agent: Secondary | ICD-10-CM

## 2016-09-11 LAB — BASIC METABOLIC PANEL
Anion gap: 7 (ref 5–15)
BUN: 52 mg/dL — ABNORMAL HIGH (ref 6–20)
CO2: 24 mmol/L (ref 22–32)
Calcium: 8.4 mg/dL — ABNORMAL LOW (ref 8.9–10.3)
Chloride: 108 mmol/L (ref 101–111)
Creatinine, Ser: 1.99 mg/dL — ABNORMAL HIGH (ref 0.61–1.24)
GFR calc Af Amer: 36 mL/min — ABNORMAL LOW (ref >60)
GFR calc non Af Amer: 31 mL/min — ABNORMAL LOW (ref >60)
Glucose, Bld: 112 mg/dL — ABNORMAL HIGH (ref 65–99)
Potassium: 4.4 mmol/L (ref 3.5–5.1)
Sodium: 139 mmol/L (ref 135–145)

## 2016-09-11 LAB — CBC
HCT: 36.5 % — ABNORMAL LOW (ref 40.0–52.0)
Hemoglobin: 12.3 g/dL — ABNORMAL LOW (ref 13.0–18.0)
MCH: 30.7 pg (ref 26.0–34.0)
MCHC: 33.7 g/dL (ref 32.0–36.0)
MCV: 90.9 fL (ref 80.0–100.0)
Platelets: 185 10*3/uL (ref 150–440)
RBC: 4.01 MIL/uL — ABNORMAL LOW (ref 4.40–5.90)
RDW: 14.5 % (ref 11.5–14.5)
WBC: 11.7 10*3/uL — ABNORMAL HIGH (ref 3.8–10.6)

## 2016-09-11 LAB — GLUCOSE, CAPILLARY
Glucose-Capillary: 97 mg/dL (ref 65–99)
Glucose-Capillary: 104 mg/dL — ABNORMAL HIGH (ref 65–99)
Glucose-Capillary: 89 mg/dL (ref 65–99)
Glucose-Capillary: 136 mg/dL — ABNORMAL HIGH (ref 65–99)

## 2016-09-11 NOTE — Progress Notes (Addendum)
[place] at [place] [name]    MR#:  [id number]  DATE OF BIRTH:  1940/11/16  SUBJECTIVE:feels better/.less abdominal  Pain.less diarrhoea.overall better.  CHIEF COMPLAINT:   Chief Complaint  Patient presents with  . Abdominal Pain    REVIEW OF SYSTEMS:   ROS CONSTITUTIONAL: No fever, fatigue or weakness.  EYES: No blurred or double vision.  EARS, NOSE, AND THROAT: No tinnitus or ear pain.  RESPIRATORY: No cough, shortness of breath, wheezing or hemoptysis.  CARDIOVASCULAR: No chest pain, orthopnea, edema.  GASTROINTESTINAL: No nausea, vomiting, diarrhea. Has right lower quadrant abdominal pain. But much better than yesterday. GENITOURINARY: No dysuria, hematuria.  ENDOCRINE: No polyuria, nocturia,  HEMATOLOGY: No anemia, easy bruising or bleeding SKIN: No rash or lesion. MUSCULOSKELETAL: No joint pain or arthritis.   NEUROLOGIC: No tingling, numbness, weakness.  PSYCHIATRY: No anxiety or depression.   DRUG ALLERGIES:   Allergies  Allergen Reactions  . Celecoxib     unknown     VITALS:  Blood pressure (!) 148/55, pulse (!) 59, temperature 98 F (36.7 C), temperature source Oral, resp. rate 20, height _0  (1.702 m), weight 83.5 kg (184 lb), SpO2 97 %.  PHYSICAL EXAMINATION:  GENERAL:  76 y.o.-year-old patient lying in the bed with no acute distress.  EYES: Pupils equal, round, reactive to light and accommodation. No scleral icterus. Extraocular muscles intact.  HEENT: Head atraumatic, normocephalic. Oropharynx and nasopharynx clear.  NECK:  Supple, no jugular venous distention. No thyroid enlargement, no tenderness.  LUNGS: Normal breath sounds bilaterally, no wheezing, rales,rhonchi or crepitation. No use of accessory muscles of respiration.  CARDIOVASCULAR: S1, S2 normal. No murmurs, rubs, or gallops.  ABDOMEN: non tender,non distended.t. Bowel sounds present no organomegaly  EXTREMITIES: No pedal edema,  cyanosis, or clubbing.  NEUROLOGIC: Cranial nerves II through XII are intact. Muscle strength 5/5 in all extremities. Sensation intact. Gait not checked.  PSYCHIATRIC: The patient is alert and oriented x 3.  SKIN: No obvious rash, lesion, or ulcer.    LABORATORY PANEL:   CBC  Recent Labs Lab 09/11/16 0529  WBC 11.7*  HGB 12.3*  HCT 36.5*  PLT 185   ------------------------------------------------------------------------------------------------------------------  Chemistries   Recent Labs Lab 09/11/16 0529  NA 139  K 4.4  CL 108  CO2 24  GLUCOSE 112*  BUN 52*  CREATININE 1.99*  CALCIUM 8.4*   ------------------------------------------------------------------------------------------------------------------  Cardiac Enzymes No results for input(s): TROPONINI in the last 168 hours. ------------------------------------------------------------------------------------------------------------------  RADIOLOGY:  No results found.  EKG:   Orders placed or performed in visit on 12/25/15  . EKG 12-Lead    ASSESSMENT AND PLAN:  #1 right lower quadrant pain with acute appendicitis: Conservative management with IV fluids, IV antibiotics and IV pain medicines and watch closely, resolving. Patient has history of chronic systolic heart failure,  h/o of pacemaker placement so family is not keen on surgery anyway.surgery signed off; abx chnaged to oral.and likley d./c home am with cipro,flagyl, Closely, continue Cipro, Flagyl #2 Norvo virus diarrhea: Continue to treatment, continue IV hydration,clinically better. #3 acute kidney injury due to dehydration and ATN secondary to dehydration: Continue IV hydration, avoid nephrotoxic agents, metformin, hold ACE inhibitors.kidney function better.  continue IV hydration, monitor kidney function closely.  D/w wife and pt,pts RN All the records are reviewed and case discussed with Care Management/Social Workerr. Management plans discussed  with the patient, family and they are in agreement.  CODE STATUS: full  TOTAL TIME  TAKING CARE OF THIS PATIENT: 35 minutes.   POSSIBLE D/C IN 1-2DAYS, DEPENDING ON CLINICAL CONDITION.   Epifanio Lesches M.D on 09/11/2016 at 5:08 PM  Between 7am to 6pm - Pager - [phone]  After 6pm go to [email or web address] - password [email or web address] [facility]  Office  [phone]  CC: Primary care physician; [name], MD   Note: This dictation was prepared with Dragon dictation along with smaller phrase technology. Any transcriptional errors that result from this process are unintentional.

## 2016-09-11 NOTE — Progress Notes (Signed)
09/11/2016  Subjective: No acute events. He reports that his diarrhea is improving. No significant abdominal pain. Tolerated ice chips and sips of water with no problems. His stool studies have come back positive for Norovirus.  Vital signs: Temp:  [98.2 F (36.8 C)-99.4 F (37.4 C)] 98.3 F (36.8 C) (04/19 0534) Pulse Rate:  [59-61] 59 (04/19 0534) Resp:  [18-20] 18 (04/19 0534) BP: (136-153)/(48-56) 153/56 (04/19 0534) SpO2:  [94 %-97 %] 97 % (04/19 0534)   Intake/Output: 04/18 0701 - 04/19 0700 In: 1687.8 [I.V.:1687.8] Out: 938 [Urine:650; Stool:1] Last BM Date: 09/10/16  Physical Exam: Constitutional: No acute distress Abdomen: soft, nondistended, with mild discomfort only to palpation in the right lower quadrant. No peritoneal signs  Labs:   Recent Labs  09/10/16 0517 09/11/16 0529  WBC 14.7* 11.7*  HGB 12.6* 12.3*  HCT 36.5* 36.5*  PLT 170 185    Recent Labs  09/10/16 0517 09/11/16 0529  NA 136 139  K 4.3 4.4  CL 105 108  CO2 25 24  GLUCOSE 136* 112*  BUN 51* 52*  CREATININE 2.92* 1.99*  CALCIUM 8.4* 8.4*   No results for input(s): LABPROT, INR in the last 72 hours.  Imaging: No results found.  Assessment/Plan: 76 year old male with gastroenteritis with norovirus.  -Patient's white blood cell count continues to improve and his renal function continues to improve as well. -From a surgical standpoint we'll advance the patient's diet to clear liquids this morning and advance as tolerated. No acute surgical needs as the patient has gastroenteritis. We'll sign off for now. Please feel free to call or consult again if any questions or concerns.  Melvyn Neth, [place] 7a-7p: [place] 7p-7a: [phone]

## 2016-09-12 LAB — GLUCOSE, CAPILLARY
Glucose-Capillary: 119 mg/dL — ABNORMAL HIGH (ref 65–99)
Glucose-Capillary: 142 mg/dL — ABNORMAL HIGH (ref 65–99)
Glucose-Capillary: 100 mg/dL — ABNORMAL HIGH (ref 65–99)
Glucose-Capillary: 143 mg/dL — ABNORMAL HIGH (ref 65–99)

## 2016-09-12 LAB — BASIC METABOLIC PANEL
Anion gap: 5 (ref 5–15)
BUN: 39 mg/dL — ABNORMAL HIGH (ref 6–20)
CO2: 24 mmol/L (ref 22–32)
Calcium: 8.4 mg/dL — ABNORMAL LOW (ref 8.9–10.3)
Chloride: 112 mmol/L — ABNORMAL HIGH (ref 101–111)
Creatinine, Ser: 1.48 mg/dL — ABNORMAL HIGH (ref 0.61–1.24)
GFR calc Af Amer: 51 mL/min — ABNORMAL LOW (ref >60)
GFR calc non Af Amer: 44 mL/min — ABNORMAL LOW (ref >60)
Glucose, Bld: 122 mg/dL — ABNORMAL HIGH (ref 65–99)
Potassium: 4 mmol/L (ref 3.5–5.1)
Sodium: 141 mmol/L (ref 135–145)

## 2016-09-12 MED ORDER — METOCLOPRAMIDE HCL 5 MG/ML IJ SOLN
10.0000 mg | Freq: Three times a day (TID) | INTRAMUSCULAR | Status: AC
  Administered 2016-09-12 – 2016-09-14 (×8): 10 mg via INTRAVENOUS
  Filled 2016-09-12 (×8): qty 2

## 2016-09-12 MED ORDER — ENSURE ENLIVE PO LIQD
237.0000 mL | Freq: Two times a day (BID) | ORAL | Status: DC
  Administered 2016-09-12 – 2016-09-14 (×4): 237 mL via ORAL

## 2016-09-12 NOTE — Progress Notes (Signed)
[facility] at [place] NAME: Savion Washam    MR#:  [id number]  DATE OF BIRTH:  [birth year]  SUBJECTIVE:  CHIEF COMPLAINT:   Chief Complaint  Patient presents with  . Abdominal Pain   - complains of nausea and an episode of vomiting today - diarrhea improving - abdominal pain is improving  REVIEW OF SYSTEMS:  Review of Systems  Constitutional: Negative for chills, fever and malaise/fatigue.  HENT: Negative for congestion, ear discharge, hearing loss and nosebleeds.   Eyes: Negative for blurred vision and double vision.  Respiratory: Negative for cough, shortness of breath and wheezing.   Cardiovascular: Negative for chest pain and palpitations.  Gastrointestinal: Positive for abdominal pain, nausea and vomiting. Negative for constipation and diarrhea.  Genitourinary: Negative for dysuria.  Musculoskeletal: Negative for myalgias.  Neurological: Negative for dizziness, speech change, focal weakness, seizures and headaches.  Psychiatric/Behavioral: Negative for depression.    DRUG ALLERGIES:   Allergies  Allergen Reactions  . Celecoxib     unknown     VITALS:  Blood pressure (!) 154/58, pulse 60, temperature 97.9 F (36.6 C), temperature source Oral, resp. rate 18, height _0  (1.702 m), weight 83.5 kg (184 lb), SpO2 95 %.  PHYSICAL EXAMINATION:  Physical Exam  GENERAL:  76 y.o.-year-old patient lying in the bed with no acute distress.  EYES: Pupils equal, round, reactive to light and accommodation. No scleral icterus. Extraocular muscles intact.  HEENT: Head atraumatic, normocephalic. Oropharynx and nasopharynx clear.  NECK:  Supple, no jugular venous distention. No thyroid enlargement, no tenderness.  LUNGS: Normal breath sounds bilaterally, no wheezing, rales,rhonchi or crepitation. No use of accessory muscles of respiration.  CARDIOVASCULAR: S1, S2 normal. No murmurs, rubs, or gallops.  ABDOMEN: Soft, minimal right lower  quadrant tenderness, nondistended. Bowel sounds present. No organomegaly or mass.  EXTREMITIES: No pedal edema, cyanosis, or clubbing.  NEUROLOGIC: Cranial nerves II through XII are intact. Muscle strength 5/5 in all extremities. Sensation intact. Gait not checked.  PSYCHIATRIC: The patient is alert and oriented x 3.  SKIN: No obvious rash, lesion, or ulcer.    LABORATORY PANEL:   CBC  Recent Labs Lab 09/11/16 0529  WBC 11.7*  HGB 12.3*  HCT 36.5*  PLT 185   ------------------------------------------------------------------------------------------------------------------  Chemistries   Recent Labs Lab 09/12/16 0436  NA 141  K 4.0  CL 112*  CO2 24  GLUCOSE 122*  BUN 39*  CREATININE 1.48*  CALCIUM 8.4*   ------------------------------------------------------------------------------------------------------------------  Cardiac Enzymes No results for input(s): TROPONINI in the last 168 hours. ------------------------------------------------------------------------------------------------------------------  RADIOLOGY:  No results found.  EKG:   Orders placed or performed in visit on 12/25/15  . 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  #1 acute gastroenteritis-secondary to rotavirus. Also history of recent travel. -Inflammation noted on CT scan at the small bowel, ileum region. -Continue Cipro and Flagyl at this time. -Added Reglan prior to meals and continue Zofran when necessary. -IV fluids. -Added probiotics for his diarrhea  #2 acute appendicitis-appreciate surgical consult. Less likely to be acute appendicitis and more likely to be gastroenteritis with the dilated appendix. -Recommended conservative management with antibiotics and fluids at this  time. -Patient's symptoms are improving slowly. -Continue antibiotics. -Diet has been advanced.  #3 acute renal failure-ATN and  prerenal on admission. -With IV fluids, improving creatinine. Admission creatinine was 3.3 and currently at 1.4. -Avoid nephrotoxins.  #4 rheumatoid arthritis-continue outpatient treatment.  #5 history of heart block status post pacemaker-continue bisoprolol  #6 hyperthyroidism-continue Synthroid      All the records are reviewed and case discussed with Care Management/Social Workerr. Management plans discussed with the patient, family and they are in agreement.  CODE STATUS: Full Code  TOTAL TIME TAKING CARE OF THIS PATIENT: 38 minutes.   POSSIBLE D/C IN 1-2 DAYS, DEPENDING ON CLINICAL CONDITION.   Johnpatrick Jenny M.D on 09/12/2016 at 4:07 PM  Between 7am to 6pm - Pager - [phone]  After 6pm go to [email or web address] - password [facility]  Office  [phone]  CC: Primary care physician; [name], MD

## 2016-09-12 NOTE — Care Management Important Message (Signed)
Important Message  Patient Details  [name] MRN: [id number] Date of Birth: 02-26-1941   Medicare Important Message Given:  Yes    Beverly Sessions, RN 09/12/2016, 3:46 PM

## 2016-09-12 NOTE — Progress Notes (Signed)
Patient was eager to eat, ambulated. Patient was given oral Flagyl along with Reglan.  Following meal consumption patient was sleep, yet wife reported that he felt bad all over again. Patient did not have nausea or vomiting following meal.  Pt asleep in bed, continue to assess.

## 2016-09-13 LAB — BASIC METABOLIC PANEL
Anion gap: 5 (ref 5–15)
BUN: 31 mg/dL — ABNORMAL HIGH (ref 6–20)
CO2: 24 mmol/L (ref 22–32)
Calcium: 8.2 mg/dL — ABNORMAL LOW (ref 8.9–10.3)
Chloride: 112 mmol/L — ABNORMAL HIGH (ref 101–111)
Creatinine, Ser: 1.17 mg/dL (ref 0.61–1.24)
GFR calc Af Amer: >60 mL/min (ref >60)
GFR calc non Af Amer: 59 mL/min — ABNORMAL LOW (ref >60)
Glucose, Bld: 111 mg/dL — ABNORMAL HIGH (ref 65–99)
Potassium: 3.8 mmol/L (ref 3.5–5.1)
Sodium: 141 mmol/L (ref 135–145)

## 2016-09-13 LAB — CBC
HCT: 34.8 % — ABNORMAL LOW (ref 40.0–52.0)
Hemoglobin: 11.7 g/dL — ABNORMAL LOW (ref 13.0–18.0)
MCH: 29.9 pg (ref 26.0–34.0)
MCHC: 33.5 g/dL (ref 32.0–36.0)
MCV: 89.2 fL (ref 80.0–100.0)
Platelets: 201 10*3/uL (ref 150–440)
RBC: 3.9 MIL/uL — ABNORMAL LOW (ref 4.40–5.90)
RDW: 14.3 % (ref 11.5–14.5)
WBC: 9.4 10*3/uL (ref 3.8–10.6)

## 2016-09-13 LAB — TSH: TSH: 0.174 u[IU]/mL — ABNORMAL LOW (ref 0.350–4.500)

## 2016-09-13 MED ORDER — LOPERAMIDE HCL 2 MG PO CAPS
2.0000 mg | ORAL_CAPSULE | Freq: Four times a day (QID) | ORAL | Status: DC | PRN
  Administered 2016-09-13: 2 mg via ORAL
  Filled 2016-09-13: qty 1

## 2016-09-13 MED ORDER — RISAQUAD PO CAPS
1.0000 | ORAL_CAPSULE | Freq: Two times a day (BID) | ORAL | Status: DC
  Administered 2016-09-13 – 2016-09-15 (×5): 1 via ORAL
  Filled 2016-09-13 (×5): qty 1

## 2016-09-13 NOTE — Progress Notes (Signed)
[place] [facility] [place] [name]    MR#:  [id number]  DATE OF BIRTH:  1941/02/23  SUBJECTIVE:  CHIEF COMPLAINT:   Chief Complaint  Patient presents with  . Abdominal Pain   - feels better today, 2 stools last night- forming now - Appetite is still very poor, vomiting and abdominal pain have resolved. Slightly nauseous  REVIEW OF SYSTEMS:  Review of Systems  Constitutional: Negative for chills, fever and malaise/fatigue.  HENT: Negative for congestion, ear discharge, hearing loss and nosebleeds.   Eyes: Negative for blurred vision and double vision.  Respiratory: Negative for cough, shortness of breath and wheezing.   Cardiovascular: Negative for chest pain and palpitations.  Gastrointestinal: Positive for diarrhea and nausea. Negative for abdominal pain, constipation and vomiting.  Genitourinary: Negative for dysuria.  Musculoskeletal: Negative for myalgias.  Neurological: Negative for dizziness, speech change, focal weakness, seizures and headaches.  Psychiatric/Behavioral: Negative for depression.    DRUG ALLERGIES:   Allergies  Allergen Reactions  . Celecoxib     unknown     VITALS:  Blood pressure (!) 168/60, pulse 60, temperature 98 F (36.7 C), temperature source Oral, resp. rate 16, height _0  (1.702 m), weight 83.5 kg (184 lb), SpO2 97 %.  PHYSICAL EXAMINATION:  Physical Exam  GENERAL:  76 y.o.-year-old patient lying in the bed with no acute distress.  EYES: Pupils equal, round, reactive to light and accommodation. No scleral icterus. Extraocular muscles intact.  HEENT: Head atraumatic, normocephalic. Oropharynx and nasopharynx clear.  NECK:  Supple, no jugular venous distention. No thyroid enlargement, no tenderness.  LUNGS: Normal breath sounds bilaterally, no wheezing, rales,rhonchi or crepitation. No use of accessory muscles of respiration.  CARDIOVASCULAR: S1, S2 normal. No murmurs, rubs, or  gallops.  ABDOMEN: Soft, minimal right lower quadrant tenderness, nondistended. Bowel sounds present. No organomegaly or mass.  EXTREMITIES: No pedal edema, cyanosis, or clubbing.  NEUROLOGIC: Cranial nerves II through XII are intact. Muscle strength 5/5 in all extremities. Sensation intact. Gait not checked.  PSYCHIATRIC: The patient is alert and oriented x 3.  SKIN: No obvious rash, lesion, or ulcer.    LABORATORY PANEL:   CBC  Recent Labs Lab 09/13/16 0500  WBC 9.4  HGB 11.7*  HCT 34.8*  PLT 201   ------------------------------------------------------------------------------------------------------------------  Chemistries   Recent Labs Lab 09/13/16 0500  NA 141  K 3.8  CL 112*  CO2 24  GLUCOSE 111*  BUN 31*  CREATININE 1.17  CALCIUM 8.2*   ------------------------------------------------------------------------------------------------------------------  Cardiac Enzymes No results for input(s): TROPONINI in the last 168 hours. ------------------------------------------------------------------------------------------------------------------  RADIOLOGY:  No results found.  EKG:   Orders placed or performed in visit on 12/25/15  . 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  #1 acute gastroenteritis-secondary to rotavirus. Also history of recent travel. -Inflammation noted on CT scan at the small bowel, ileum region. -Continue Cipro and Flagyl at this time. -Added Reglan prior to meals and continue Zofran when necessary. Improving symptoms -DC IV fluids. -Added probiotics for his diarrhea  #2 acute appendicitis-appreciate surgical consult. Less likely to be acute appendicitis and more likely to be gastroenteritis with the dilated appendix. -Recommended  conservative management with antibiotics at this time. -Patient's symptoms are improving. -Continue antibiotics. -tolerating advanced diet.  #  3 acute renal failure-ATN and prerenal on admission. -With IV fluids, improved creatinine. Admission creatinine was 3.3 and currently at 1.1. -Avoid nephrotoxins.  #4 rheumatoid arthritis-continue outpatient treatment.  #5 history of heart block status post pacemaker-continue bisoprolol  #6 hyperthyroidism-continue Synthroid  Encourage ambulation.    All the records are reviewed and case discussed with Care Management/Social Workerr. Management plans discussed with the patient, family and they are in agreement.  CODE STATUS: Full Code  TOTAL TIME TAKING CARE OF THIS PATIENT: 36 minutes.   POSSIBLE D/C TOMORROW, DEPENDING ON CLINICAL CONDITION.   Gladstone Lighter M.D on 09/13/2016 at 12:32 PM  Between 7am to 6pm - Pager - [phone]  After 6pm go to [email or web address] - password [facility]  Office  [phone]  CC: Primary care physician; [name], MD

## 2016-09-14 ENCOUNTER — Inpatient Hospital Stay: Payer: Medicare Other

## 2016-09-14 LAB — BASIC METABOLIC PANEL
Anion gap: 7 (ref 5–15)
BUN: 29 mg/dL — ABNORMAL HIGH (ref 6–20)
CO2: 26 mmol/L (ref 22–32)
Calcium: 8.8 mg/dL — ABNORMAL LOW (ref 8.9–10.3)
Chloride: 108 mmol/L (ref 101–111)
Creatinine, Ser: 1.44 mg/dL — ABNORMAL HIGH (ref 0.61–1.24)
GFR calc Af Amer: 53 mL/min — ABNORMAL LOW (ref >60)
GFR calc non Af Amer: 46 mL/min — ABNORMAL LOW (ref >60)
Glucose, Bld: 153 mg/dL — ABNORMAL HIGH (ref 65–99)
Potassium: 4.8 mmol/L (ref 3.5–5.1)
Sodium: 141 mmol/L (ref 135–145)

## 2016-09-14 LAB — URINALYSIS, COMPLETE (UACMP) WITH MICROSCOPIC
Bilirubin Urine: NEGATIVE
Glucose, UA: NEGATIVE mg/dL
Ketones, ur: 5 mg/dL — AB
Nitrite: NEGATIVE
Protein, ur: NEGATIVE mg/dL
Specific Gravity, Urine: 1.014 (ref 1.005–1.030)
pH: 5 (ref 5.0–8.0)

## 2016-09-14 MED ORDER — DIFLUPREDNATE 0.05 % OP EMUL
1.0000 [drp] | Freq: Two times a day (BID) | OPHTHALMIC | Status: DC
  Administered 2016-09-15 (×2): 1 [drp] via OPHTHALMIC

## 2016-09-14 MED ORDER — SODIUM CHLORIDE 0.9 % IV SOLN
INTRAVENOUS | Status: AC
  Administered 2016-09-14: via INTRAVENOUS

## 2016-09-14 MED ORDER — AMLODIPINE BESYLATE 5 MG PO TABS
5.0000 mg | ORAL_TABLET | Freq: Every day | ORAL | Status: DC
  Administered 2016-09-14 – 2016-09-15 (×2): 5 mg via ORAL
  Filled 2016-09-14 (×2): qty 1

## 2016-09-14 MED ORDER — SODIUM CHLORIDE 0.9 % IV SOLN
Freq: Once | INTRAVENOUS | Status: AC
  Administered 2016-09-14: 16:00:00 via INTRAVENOUS

## 2016-09-14 MED ORDER — CIPROFLOXACIN HCL 500 MG PO TABS
500.0000 mg | ORAL_TABLET | Freq: Two times a day (BID) | ORAL | Status: DC
  Administered 2016-09-14 – 2016-09-15 (×3): 500 mg via ORAL
  Filled 2016-09-14 (×3): qty 1

## 2016-09-14 MED ORDER — PROCHLORPERAZINE EDISYLATE 5 MG/ML IJ SOLN
5.0000 mg | INTRAMUSCULAR | Status: DC | PRN
  Administered 2016-09-14: 5 mg via INTRAVENOUS
  Filled 2016-09-14 (×2): qty 1

## 2016-09-14 MED ORDER — LABETALOL HCL 5 MG/ML IV SOLN
5.0000 mg | INTRAVENOUS | Status: DC | PRN
  Filled 2016-09-14: qty 4

## 2016-09-14 NOTE — Progress Notes (Signed)
IVF expired.  Patient is putting out just barely the adequate amount of urine.  Dr Tressia Miners ordered NS to run at 17m/hr for 10 hours

## 2016-09-14 NOTE — Progress Notes (Addendum)
Dr Tressia Miners gave order for KUB due to abdominal pain.  Durezol to be given twice a day starting today

## 2016-09-14 NOTE — Progress Notes (Signed)
Pharmacy Antibiotic Note  Cameron [name] is a 76 y.o. male admitted on 09/09/2016 with AKI and travelers diarrhea.  Pharmacy has been consulted for ciprofloxacin dosing for travelers.  Plan: Patient already completed 6 doses of Cipro 563m PO BID. Antibiotics are to be continued per MD note. Will reorder this for another 3 days.   Height: _0  (170.2 cm) Weight: 184 lb (83.5 kg) IBW/kg (Calculated) : 66.1  Temp (24hrs), Avg:98 F (36.7 C), Min:97.8 F (36.6 C), Max:98.1 F (36.7 C)   Recent Labs Lab 09/10/16 0517 09/11/16 0529 09/12/16 0436 09/13/16 0500 09/14/16 0700  WBC 14.7* 11.7*  --  9.4  --   CREATININE 2.92* 1.99* 1.48* 1.17 1.44*    Estimated Creatinine Clearance: 45.1 mL/min (A) (by C-G formula based on SCr of 1.44 mg/dL (H)).    Allergies  Allergen Reactions  . Celecoxib     unknown     Thank you for allowing pharmacy to be a part of this patient's care.  KOlivia CanterRPalos Health Surgery CenterClinical Pharmacist 09/14/2016 7:54 AM

## 2016-09-14 NOTE — Progress Notes (Signed)
[place] at [place] [name]    MR#:  [id number]  DATE OF BIRTH:  Feb 23, 1941  SUBJECTIVE:  CHIEF COMPLAINT:   Chief Complaint  Patient presents with  . Abdominal Pain   - Had a rough night last night. Significant left lower abdominal pain radiating down to his testicles. Improving now. Had significant episodes of nausea and vomiting said the same time. -His diarrhea is resolved.  REVIEW OF SYSTEMS:  Review of Systems  Constitutional: Negative for chills, fever and malaise/fatigue.  HENT: Negative for congestion, ear discharge, hearing loss and nosebleeds.   Eyes: Negative for blurred vision and double vision.  Respiratory: Negative for cough, shortness of breath and wheezing.   Cardiovascular: Negative for chest pain, palpitations and leg swelling.  Gastrointestinal: Positive for abdominal pain, diarrhea, nausea and vomiting. Negative for constipation.  Genitourinary: Positive for hematuria. Negative for dysuria.  Musculoskeletal: Negative for myalgias.  Neurological: Negative for dizziness, speech change, focal weakness, seizures and headaches.  Psychiatric/Behavioral: Negative for depression.    DRUG ALLERGIES:   Allergies  Allergen Reactions  . Celecoxib     unknown     VITALS:  Blood pressure (!) 187/66, pulse 64, temperature 97.9 F (36.6 C), temperature source Oral, resp. rate 16, height _0  (1.702 m), weight 83.5 kg (184 lb), SpO2 95 %.  PHYSICAL EXAMINATION:  Physical Exam  GENERAL:  76 y.o.-year-old patient lying in the bed with no acute distress.  EYES: Pupils equal, round, reactive to light and accommodation. No scleral icterus. Extraocular muscles intact.  HEENT: Head atraumatic, normocephalic. Oropharynx and nasopharynx clear.  NECK:  Supple, no jugular venous distention. No thyroid enlargement, no tenderness.  LUNGS: Normal breath sounds bilaterally, no wheezing, rales,rhonchi or crepitation. No  use of accessory muscles of respiration.  CARDIOVASCULAR: S1, S2 normal. No murmurs, rubs, or gallops.  ABDOMEN: Soft, minimal Left lower quadrant tenderness, nondistended. Bowel sounds present. No organomegaly or mass.  EXTREMITIES: No pedal edema, cyanosis, or clubbing.  NEUROLOGIC: Cranial nerves II through XII are intact. Muscle strength 5/5 in all extremities. Sensation intact. Gait not checked.  PSYCHIATRIC: The patient is alert and oriented x 3.  SKIN: No obvious rash, lesion, or ulcer.    LABORATORY PANEL:   CBC  Recent Labs Lab 09/13/16 0500  WBC 9.4  HGB 11.7*  HCT 34.8*  PLT 201   ------------------------------------------------------------------------------------------------------------------  Chemistries   Recent Labs Lab 09/14/16 0700  NA 141  K 4.8  CL 108  CO2 26  GLUCOSE 153*  BUN 29*  CREATININE 1.44*  CALCIUM 8.8*   ------------------------------------------------------------------------------------------------------------------  Cardiac Enzymes No results for input(s): TROPONINI in the last 168 hours. ------------------------------------------------------------------------------------------------------------------  RADIOLOGY:  No results found.  EKG:   Orders placed or performed in visit on 12/25/15  . 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  #1 acute gastroenteritis-secondary to rotavirus. Also history of recent travel. -Inflammation noted on CT scan at the small bowel, ileum region. -on Cipro and Flagyl at this time. -on Reglan prior to meals and continue Zofran when necessary. Improving symptoms -Added probiotics for his diarrhea -Sudden onset of lower abdominal pain on the left side with bloody urine, could be renal stone.  Check KUB today. Symptoms have improved a lot today.  #2 acute  appendicitis-appreciate surgical consult. Less likely to be acute appendicitis and more likely to be gastroenteritis with the dilated appendix. -Recommended conservative management with antibiotics at this time. -Continue antibiotics. -tolerating advanced diet.  #3 acute renal failure-ATN and prerenal on admission. -With IV fluids, improved creatinine. Admission creatinine was 3.3 -Avoid nephrotoxins.  #4 rheumatoid arthritis-continue outpatient treatment.  #5 history of heart block status post pacemaker-continue bisoprolol  #6 hypertension-continue bisoprolol. Lisinopril was on hold due to his acute renal failure. Blood pressure has elevated last night significantly from his pain. -Added Norvasc this morning  #7 DVT prophylaxis-encourage ambulation  Encourage ambulation.    All the records are reviewed and case discussed with Care Management/Social Workerr. Management plans discussed with the patient, family and they are in agreement.  CODE STATUS: Full Code  TOTAL TIME TAKING CARE OF THIS PATIENT: 36 minutes.   POSSIBLE D/C TOMORROW, DEPENDING ON CLINICAL CONDITION.   Gladstone Lighter M.D on 09/14/2016 at 11:56 AM  Between 7am to 6pm - Pager - [phone]  After 6pm go to [email or web address] - password [facility]  Office  [phone]  CC: Primary care physician; [name], MD

## 2016-09-14 NOTE — Progress Notes (Signed)
BP continues to be elevated.  Dr Tressia Miners notified.  Patient unable to take home lisinopril at this time due to kidney function.  Order given for norvasc 8m daily

## 2016-09-15 LAB — GLUCOSE, CAPILLARY
Glucose-Capillary: 164 mg/dL — ABNORMAL HIGH (ref 65–99)
Glucose-Capillary: 105 mg/dL — ABNORMAL HIGH (ref 65–99)
Glucose-Capillary: 106 mg/dL — ABNORMAL HIGH (ref 65–99)
Glucose-Capillary: 156 mg/dL — ABNORMAL HIGH (ref 65–99)
Glucose-Capillary: 196 mg/dL — ABNORMAL HIGH (ref 65–99)
Glucose-Capillary: 120 mg/dL — ABNORMAL HIGH (ref 65–99)
Glucose-Capillary: 108 mg/dL — ABNORMAL HIGH (ref 65–99)
Glucose-Capillary: 134 mg/dL — ABNORMAL HIGH (ref 65–99)
Glucose-Capillary: 196 mg/dL — ABNORMAL HIGH (ref 65–99)
Glucose-Capillary: 145 mg/dL — ABNORMAL HIGH (ref 65–99)
Glucose-Capillary: 142 mg/dL — ABNORMAL HIGH (ref 65–99)
Glucose-Capillary: 109 mg/dL — ABNORMAL HIGH (ref 65–99)

## 2016-09-15 LAB — BASIC METABOLIC PANEL
Anion gap: 4 — ABNORMAL LOW (ref 5–15)
BUN: 26 mg/dL — ABNORMAL HIGH (ref 6–20)
CO2: 25 mmol/L (ref 22–32)
Calcium: 8.3 mg/dL — ABNORMAL LOW (ref 8.9–10.3)
Chloride: 112 mmol/L — ABNORMAL HIGH (ref 101–111)
Creatinine, Ser: 1.22 mg/dL (ref 0.61–1.24)
GFR calc Af Amer: >60 mL/min (ref >60)
GFR calc non Af Amer: 56 mL/min — ABNORMAL LOW (ref >60)
Glucose, Bld: 118 mg/dL — ABNORMAL HIGH (ref 65–99)
Potassium: 3.8 mmol/L (ref 3.5–5.1)
Sodium: 141 mmol/L (ref 135–145)

## 2016-09-15 LAB — CBC
HCT: 32.8 % — ABNORMAL LOW (ref 40.0–52.0)
Hemoglobin: 11.4 g/dL — ABNORMAL LOW (ref 13.0–18.0)
MCH: 30.7 pg (ref 26.0–34.0)
MCHC: 34.7 g/dL (ref 32.0–36.0)
MCV: 88.3 fL (ref 80.0–100.0)
Platelets: 210 10*3/uL (ref 150–440)
RBC: 3.71 MIL/uL — ABNORMAL LOW (ref 4.40–5.90)
RDW: 14 % (ref 11.5–14.5)
WBC: 9.6 10*3/uL (ref 3.8–10.6)

## 2016-09-15 MED ORDER — RISAQUAD PO CAPS: 1.0000 | ORAL_CAPSULE | Freq: Two times a day (BID) | ORAL | 0 refills | Status: DC

## 2016-09-15 MED ORDER — METRONIDAZOLE 500 MG PO TABS: 500.0000 mg | ORAL_TABLET | Freq: Three times a day (TID) | ORAL | 0 refills | Status: DC

## 2016-09-15 MED ORDER — CIPROFLOXACIN HCL 500 MG PO TABS: 500.0000 mg | ORAL_TABLET | Freq: Two times a day (BID) | ORAL | 0 refills | Status: DC

## 2016-09-15 MED ORDER — ONDANSETRON 4 MG PO TBDP: 4.0000 mg | ORAL_TABLET | Freq: Three times a day (TID) | ORAL | 0 refills | Status: DC | PRN

## 2016-09-15 MED ORDER — AMLODIPINE BESYLATE 5 MG PO TABS: 5.0000 mg | ORAL_TABLET | Freq: Every day | ORAL | 5 refills | Status: DC

## 2016-09-15 NOTE — Care Management Important Message (Signed)
Important Message  Patient Details  [name] MRN: [id number] Date of Birth: 1940-11-08   Medicare Important Message Given:  Yes    Beverly Sessions, RN 09/15/2016, 9:57 AM

## 2016-09-15 NOTE — Progress Notes (Signed)
Discharge instructions reviewed with the patient and his wife.  Pt sent out via wheelchair with belongings

## 2016-09-15 NOTE — Discharge Summary (Signed)
[place] at [place] [name]    MR#:  [id number]  DATE OF BIRTH:  July 28, 1940  DATE OF ADMISSION:  09/09/2016   ADMITTING PHYSICIAN: Loletha Grayer, MD  DATE OF DISCHARGE:09/15/16  PRIMARY CARE PHYSICIAN: SPARKS,JEFFREY D, MD   ADMISSION DIAGNOSIS:   Appendicitis, unspecified appendicitis type [K37]  DISCHARGE DIAGNOSIS:   Active Problems:   Acute kidney injury ([place])   Right lower quadrant abdominal pain   SECONDARY DIAGNOSIS:   Past Medical History:  Diagnosis Date  . BPH (benign prostatic hypertrophy)   . CAD (coronary artery disease) 12/2015   Cath by Dr [name] reveals distal and small vessel CAD.  Medical therapy advised.  . 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 Diabetes mellitus without complication ([place])   . Hypertension   . Hypothyroidism   . Rheumatoid arthritis(714.0)     HOSPITAL COURSE:   76 y/o M with PMH of ADD, history of complete heart block status post pacemaker, diabetes, hypertension, arthritis, hypothyroidism admitted to hospital secondary to abdominal pain, nausea vomiting and diarrhea.  #1 acute gastroenteritis-secondary to rotavirus. with history of recent travel. -Inflammation noted on CT scan at the small bowel, ileum region. -on Cipro and Flagyl at this time. Continue for 4 more days to finish off a 10 day course. -resolved symptoms -on probiotics for his diarrhea -Sudden onset of lower abdominal pain on the left side with bloody urine, could be renal stone. Negative KUB. Resolved symptoms now  #2 acute appendicitis-appreciate surgical consult. Less likely to be acute appendicitis and more likely to be gastroenteritis with the dilated appendix. -Recommended conservative management with antibiotics at this time. -Continue antibiotics. -tolerating advanced diet. Resolved symptoms  #3 acute renal failure-ATN and prerenal on admission. -With IV fluids, improved creatinine.  Admission creatinine was 3.3 -Avoid nephrotoxins.  #4 rheumatoid arthritis-continue outpatient treatment.  #5 history of heart block status post pacemaker-continue bisoprolol  #6 hypertension-continue bisoprolol. Lisinopril was on hold due to his acute renal failure. -Added Norvasc instead.  Feeling well. For discharge today   DISCHARGE CONDITIONS:   Stable  CONSULTS OBTAINED:   None  DRUG ALLERGIES:   Allergies  Allergen Reactions  . Celecoxib     unknown    DISCHARGE MEDICATIONS:   Allergies as of 09/15/2016      Reactions   Celecoxib    unknown      Medication List    STOP taking these medications   lisinopril 5 MG tablet Commonly known as:  PRINIVIL,ZESTRIL     TAKE these medications   acidophilus Caps capsule Take 1 capsule by mouth 2 (two) times daily. X 5 days while on antibiotics   amLODipine 5 MG tablet Commonly known as:  NORVASC Take 1 tablet (5 mg total) by mouth daily. Start taking on:  09/16/2016   aspirin EC 81 MG tablet Take 81 mg by mouth daily.   atorvastatin 20 MG tablet Commonly known as:  LIPITOR Take 1 tablet (20 mg total) by mouth daily.   BLINK TEARS OP Place 1 drop into both eyes daily as needed (redness).   ciprofloxacin 500 MG tablet Commonly known as:  CIPRO Take 1 tablet (500 mg total) by mouth 2 (two) times daily. X 4 more days   Co Q 10 100 MG Caps Take 100 mg by mouth daily.   CURCUMIN 95 PO Take 1 tablet by mouth daily.   finasteride 5 MG tablet Commonly known as:  PROSCAR Take 5 mg by mouth daily.   ketoconazole 2 % cream Commonly known as:  NIZORAL Apply 1 application topically daily.   leflunomide 10 MG tablet Commonly known as:  ARAVA Take 1 tablet by mouth daily.   MAGNESIUM PO Take 1 tablet by mouth daily.   metFORMIN 500 MG tablet Commonly known as:  GLUCOPHAGE Take 500 mg by mouth 2 (two) times daily.   metroNIDAZOLE 500 MG tablet Commonly known as:  FLAGYL Take 1 tablet (500 mg  total) by mouth every 8 (eight) hours. X 4 more days   mirabegron ER 50 MG Tb24 tablet Commonly known as:  MYRBETRIQ Take 1 tablet by mouth daily.   nebivolol 5 MG tablet Commonly known as:  BYSTOLIC Take 5 mg by mouth daily.   nitroGLYCERIN 0.4 MG SL tablet Commonly known as:  NITROSTAT Place 1 tablet (0.4 mg total) under the tongue every 5 (five) minutes as needed for chest pain.   NONFORMULARY OR COMPOUNDED ITEM [facility]: Onychomycosis Nail Lacquer - Fluconazole 2%, Terbinafine 1%, DMSO, apply 1-2 grams to affected area daily.   OMEGA 3 PO Take 1 tablet by mouth 2 (two) times daily. Omega 3 XL   ondansetron 4 MG disintegrating tablet Commonly known as:  ZOFRAN ODT Take 1 tablet (4 mg total) by mouth every 8 (eight) hours as needed for nausea or vomiting.   RENU SALINE Soln Place 1 drop into both eyes daily as needed (dry eyes).   tamsulosin 0.4 MG Caps capsule Commonly known as:  FLOMAX Take 0.4 mg by mouth daily.   TERBINAFINE HCL EX Apply 1 application topically daily as needed (fungus on toenails).   thyroid 90 MG tablet Commonly known as:  ARMOUR Take 90 mg by mouth daily.        DISCHARGE INSTRUCTIONS:   1. PCP f/u in 1-2 weeks  DIET:   Cardiac diet  ACTIVITY:   Activity as tolerated  OXYGEN:   Home Oxygen: No.  Oxygen Delivery: room air  DISCHARGE LOCATION:   home   If you experience worsening of your admission symptoms, develop shortness of breath, life threatening emergency, suicidal or homicidal thoughts you must seek medical attention immediately by calling 911 or calling your MD immediately  if symptoms less severe.  You Must read complete instructions/literature along with all the possible adverse reactions/side effects for all the Medicines you take and that have been prescribed to you. Take any new Medicines after you have completely understood and accpet all the possible adverse reactions/side effects.   Please note  You  were cared for by a hospitalist during your hospital stay. If you have any questions about your discharge medications or the care you received while you were in the hospital after you are discharged, you can call the unit and asked to speak with the hospitalist on call if the hospitalist that took care of you is not available. Once you are discharged, your primary care physician will handle any further medical issues. Please note that NO REFILLS for any discharge medications will be authorized once you are discharged, as it is imperative that you return to your primary care physician (or establish a relationship with a primary care physician if you do not have one) for your aftercare needs so that they can reassess your need for medications and monitor your lab values.    On the day of Discharge:  VITAL SIGNS:   Blood pressure (!) 161/60, pulse 60, temperature 98.6 F (37 C), temperature  source Oral, resp. rate 16, height 5' 7" (1.702 m), weight 83.5 kg (184 lb), SpO2 95 %.  PHYSICAL EXAMINATION:    GENERAL:  76 y.o.-year-old patient lying in the bed with no acute distress.  EYES: Pupils equal, round, reactive to light and accommodation. No scleral icterus. Extraocular muscles intact.  HEENT: Head atraumatic, normocephalic. Oropharynx and nasopharynx clear.  NECK:  Supple, no jugular venous distention. No thyroid enlargement, no tenderness.  LUNGS: Normal breath sounds bilaterally, no wheezing, rales,rhonchi or crepitation. No use of accessory muscles of respiration.  CARDIOVASCULAR: S1, S2 normal. No murmurs, rubs, or gallops.  ABDOMEN: Soft, nontender, nondistended. Bowel sounds present. No organomegaly or mass.  EXTREMITIES: No pedal edema, cyanosis, or clubbing.  NEUROLOGIC: Cranial nerves II through XII are intact. Muscle strength 5/5 in all extremities. Sensation intact. Gait not checked.  PSYCHIATRIC: The patient is alert and oriented x 3.  SKIN: No obvious rash, lesion, or ulcer.     DATA REVIEW:   CBC  Recent Labs Lab 09/15/16 0403  WBC 9.6  HGB 11.4*  HCT 32.8*  PLT 210    Chemistries   Recent Labs Lab 09/15/16 0403  NA 141  K 3.8  CL 112*  CO2 25  GLUCOSE 118*  BUN 26*  CREATININE 1.22  CALCIUM 8.3*     Microbiology Results  Results for orders placed or performed during the hospital encounter of 09/09/16  Gastrointestinal Panel by PCR , Stool     Status: Abnormal   Collection Time: 09/09/16 10:46 AM  Result Value Ref Range Status   Campylobacter species NOT DETECTED NOT DETECTED Final   Plesimonas shigelloides NOT DETECTED NOT DETECTED Final   Salmonella species NOT DETECTED NOT DETECTED Final   Yersinia enterocolitica NOT DETECTED NOT DETECTED Final   Vibrio species NOT DETECTED NOT DETECTED Final   Vibrio cholerae NOT DETECTED NOT DETECTED Final   Enteroaggregative E coli (EAEC) NOT DETECTED NOT DETECTED Final   Enteropathogenic E coli (EPEC) NOT DETECTED NOT DETECTED Final   Enterotoxigenic E coli (ETEC) NOT DETECTED NOT DETECTED Final   Shiga like toxin producing E coli (STEC) NOT DETECTED NOT DETECTED Final   Shigella/Enteroinvasive E coli (EIEC) NOT DETECTED NOT DETECTED Final   Cryptosporidium NOT DETECTED NOT DETECTED Final   Cyclospora cayetanensis NOT DETECTED NOT DETECTED Final   Entamoeba histolytica NOT DETECTED NOT DETECTED Final   Giardia lamblia NOT DETECTED NOT DETECTED Final   Adenovirus F40/41 NOT DETECTED NOT DETECTED Final   Astrovirus NOT DETECTED NOT DETECTED Final   Norovirus GI/GII DETECTED (A) NOT DETECTED Final    Comment: RESULT CALLED TO, READ BACK BY AND VERIFIED WITH: ISABELLA ROBINSON 09/10/16 1214 KLW    Rotavirus A NOT DETECTED NOT DETECTED Final   Sapovirus (I, II, IV, and V) NOT DETECTED NOT DETECTED Final  C difficile quick scan w PCR reflex     Status: None   Collection Time: 09/09/16 10:46 AM  Result Value Ref Range Status   C Diff antigen NEGATIVE NEGATIVE Final   C Diff toxin NEGATIVE  NEGATIVE Final   C Diff interpretation No C. difficile detected.  Final  Giardia, EIA; Ova/Parasite     Status: None   Collection Time: 09/09/16 10:46 AM  Result Value Ref Range Status   Ova + Parasite Exam PENDING  Incomplete   Giardia Ag, Stl Negative Negative Final    Comment: (NOTE) Performed At: [facility] [place], [place] [id number] Lindon Romp MD HU:[id number]  Source of Sample STOOL  Corrected    RADIOLOGY:  Dg Abd 1 View  Result Date: 09/14/2016 CLINICAL DATA:  Admitted 09-09-16 with acute gastroenteritis-secondary to rotavirus, acute appendicitis, and renal failure; pt states he also began having lower abdominal pain EXAM: ABDOMEN - 1 VIEW COMPARISON:  CT, 09/09/2016 FINDINGS: There is no bowel dilation to suggest obstruction. Tiny intrarenal stones noted on CT are not visualized radiographically. No evidence of a ureteral stone. Right inguinal herniorrhaphy mesh is noted, stable. Soft tissues are otherwise unremarkable. No acute bony abnormality. IMPRESSION: 1. No acute findings.  No evidence of bowel obstruction. Electronically Signed   By: Lajean Manes M.D.   On: 09/14/2016 12:36     Management plans discussed with the patient, family and they are in agreement.  CODE STATUS:     Code Status Orders        Start     Ordered   09/09/16 1924  Full code  Continuous     09/09/16 1923    Code Status History    Date Active Date Inactive Code Status Order ID Comments User Context   12/28/2015 10:46 AM 12/28/2015  4:28 PM Full Code [id number]  Nelva Bush, MD Inpatient   12/09/2011  6:32 PM 12/11/2011  2:32 PM Full Code [id number]  Tiffany Kocher, RN Inpatient   12/09/2011  3:35 PM 12/09/2011  6:32 PM Full Code [id number]  Andrez Grime, PA-C Inpatient    Advance Directive Documentation     Most Recent Value  Type of Advance Directive  Healthcare Power of [place], Living will  Pre-existing out of facility DNR order (yellow form or  pink MOST form)  -  "MOST" Form in Place?  -      TOTAL TIME TAKING CARE OF THIS PATIENT: 38 minutes.    Gladstone Lighter M.D on 09/15/2016 at 9:59 AM  Between 7am to 6pm - Pager - [phone]  After 6pm go to [email or web address] - Proofreader  [facility]  Office  [phone]  CC: Primary care physician; [name], MD   Note: This dictation was prepared with Dragon dictation along with smaller phrase technology. Any transcriptional errors that result from this process are unintentional.

## 2016-09-16 ENCOUNTER — Encounter: Payer: Self-pay | Admitting: Physician Assistant

## 2016-09-16 LAB — GIARDIA, EIA; OVA/PARASITE: Giardia Ag, Stl: NEGATIVE

## 2016-09-16 LAB — O&P RESULT

## 2016-10-29 ENCOUNTER — Telehealth: Payer: Self-pay | Admitting: Cardiology

## 2016-10-29 ENCOUNTER — Ambulatory Visit (INDEPENDENT_AMBULATORY_CARE_PROVIDER_SITE_OTHER): Payer: Medicare Other | Admitting: *Deleted

## 2016-10-29 DIAGNOSIS — I442 Atrioventricular block, complete: Secondary | ICD-10-CM

## 2016-10-29 NOTE — Telephone Encounter (Signed)
LMOVM reminding pt to send remote transmission.

## 2016-10-29 NOTE — Progress Notes (Signed)
Remote pacemaker transmission.   

## 2016-11-03 ENCOUNTER — Ambulatory Visit (INDEPENDENT_AMBULATORY_CARE_PROVIDER_SITE_OTHER): Payer: Medicare Other | Admitting: Podiatry

## 2016-11-03 ENCOUNTER — Encounter: Payer: Self-pay | Admitting: Podiatry

## 2016-11-03 DIAGNOSIS — B351 Tinea unguium: Secondary | ICD-10-CM

## 2016-11-03 NOTE — Progress Notes (Signed)
He presents today for follow-up of his toenail fungus. He states that they seem to have gotten better with the topical terbinafine however there not progressively getting better now.  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 It appears that the onychomycosis has resolved however the nail dystrophy remains.  Assessment: Nail dystrophy hallux bilateral.  Plan: Expressed to him that there is really nothing else that can be done for this other than keep nails cut short and filed thin. Follow up with him as needed.

## 2016-11-04 ENCOUNTER — Encounter: Payer: Self-pay | Admitting: Cardiology

## 2016-11-04 LAB — CUP PACEART REMOTE DEVICE CHECK
Battery Remaining Longevity: 66 mo
Battery Remaining Percentage: 57 %
Battery Voltage: 2.89 V
Brady Statistic AP VP Percent: 95 %
Brady Statistic AP VS Percent: 1 %
Brady Statistic AS VP Percent: 4.7 %
Brady Statistic AS VS Percent: 1 %
Brady Statistic RA Percent Paced: 95 %
Brady Statistic RV Percent Paced: 99 %
Date Time Interrogation Session: 20180606074031
Implantable Lead Implant Date: 20130716
Implantable Lead Implant Date: 20130716
Implantable Lead Location: 753859
Implantable Lead Location: 753860
Implantable Lead Model: 1948
Implantable Pulse Generator Implant Date: 20130716
Lead Channel Impedance Value: 340 Ohm
Lead Channel Impedance Value: 640 Ohm
Lead Channel Pacing Threshold Amplitude: 0.625 V
Lead Channel Pacing Threshold Amplitude: 0.875 V
Lead Channel Pacing Threshold Pulse Width: 0.5 ms
Lead Channel Pacing Threshold Pulse Width: 0.5 ms
Lead Channel Sensing Intrinsic Amplitude: 1.7 mV
Lead Channel Sensing Intrinsic Amplitude: 12 mV
Lead Channel Setting Pacing Amplitude: 1.125
Lead Channel Setting Pacing Amplitude: 1.625
Lead Channel Setting Pacing Pulse Width: 0.5 ms
Lead Channel Setting Sensing Sensitivity: 4 mV
Pulse Gen Model: 2210
Pulse Gen Serial Number: 7356500

## 2016-12-04 ENCOUNTER — Other Ambulatory Visit: Payer: Self-pay | Admitting: Internal Medicine

## 2016-12-11 ENCOUNTER — Telehealth: Payer: Self-pay | Admitting: Internal Medicine

## 2016-12-11 NOTE — Telephone Encounter (Signed)
New message    [facility] is calling for clearance.       [facility] Pre-operative Risk Assessment    Request for surgical clearance:  1. What type of surgery is being performed? TURP  2. When is this surgery scheduled? 12/18/16  3. Are there any medications that need to be held prior to surgery and how long? Not that they know of.   4. Name of physician performing surgery? Scott Stoioff   5. What is your office phone and fax number? Phone-[phone] WKM-[phone]   Alben Spittle 12/11/2016, 3:51 PM  _________________________________________________________________   (provider comments below)

## 2016-12-12 NOTE — Telephone Encounter (Signed)
Spoke with wife and let her know would discuss with Dr [name] on Mon

## 2016-12-12 NOTE — Telephone Encounter (Signed)
Dr [name] is in the office on 12/15/16 and will address

## 2016-12-12 NOTE — Telephone Encounter (Signed)
Follow up    Pt wife is calling to follow up on this.

## 2016-12-15 ENCOUNTER — Telehealth: Payer: Self-pay | Admitting: Internal Medicine

## 2016-12-15 NOTE — Telephone Encounter (Signed)
Returned call to wife and she did not need a call back.  Just wanted to let me know she received my call.

## 2016-12-15 NOTE — Telephone Encounter (Signed)
New message    Wife calling receive message from the nurse- will proceed with the upcoming procedure.

## 2016-12-15 NOTE — Telephone Encounter (Signed)
Discussed with Dr [name] and may proceed if medically indicated. Will fax now.

## 2017-01-05 DIAGNOSIS — Z9079 Acquired absence of other genital organ(s): Secondary | ICD-10-CM | POA: Insufficient documentation

## 2017-01-28 ENCOUNTER — Encounter: Payer: Medicare Other | Admitting: *Deleted

## 2017-01-28 ENCOUNTER — Telehealth: Payer: Self-pay | Admitting: Cardiology

## 2017-01-28 NOTE — Telephone Encounter (Signed)
Spoke with pt and reminded pt of remote transmission that is due today. Pt verbalized understanding.   

## 2017-03-19 ENCOUNTER — Encounter: Payer: Self-pay | Admitting: Urology

## 2017-03-19 ENCOUNTER — Telehealth: Payer: Self-pay | Admitting: Urology

## 2017-03-19 ENCOUNTER — Ambulatory Visit (INDEPENDENT_AMBULATORY_CARE_PROVIDER_SITE_OTHER): Payer: Medicare Other | Admitting: Urology

## 2017-03-19 VITALS — BP 172/79 | HR 101 | Ht 67.0 in | Wt 184.0 lb

## 2017-03-19 DIAGNOSIS — N400 Enlarged prostate without lower urinary tract symptoms: Secondary | ICD-10-CM

## 2017-03-19 LAB — BLADDER SCAN AMB NON-IMAGING

## 2017-03-19 LAB — MICROSCOPIC EXAMINATION
Epithelial Cells (non renal): NONE SEEN /hpf (ref 0–10)
RBC, UA: NONE SEEN /hpf (ref 0–?)

## 2017-03-19 LAB — URINALYSIS, COMPLETE
Bilirubin, UA: NEGATIVE
Glucose, UA: NEGATIVE
Ketones, UA: NEGATIVE
Nitrite, UA: NEGATIVE
RBC, UA: NEGATIVE
Specific Gravity, UA: 1.02 (ref 1.005–1.030)
Urobilinogen, Ur: 0.2 mg/dL (ref 0.2–1.0)
pH, UA: 5.5 (ref 5.0–7.5)

## 2017-03-20 MED ORDER — MIRABEGRON ER 50 MG PO TB24: 50.0000 mg | ORAL_TABLET | Freq: Every day | ORAL | 3 refills | Status: DC

## 2017-03-20 NOTE — Progress Notes (Signed)
03/19/2017 7:08 AM   Cameron [name] Oct 12, 1940 [id number]  Referring provider: [name], MD [facility] [place], [place] [address]  Chief Complaint  Patient presents with  . Benign Prostatic Hypertrophy    follow up    HPI: 76 yo male with a long history of storage related voiding symptoms.  He was initially on anticholinergic therapy with improvement in his symptoms for several years and was subsequently changed to Myrbetriq due to worsening voiding symptoms.  Over the last 12 months his symptoms significantly worsened.  A urodynamic study was consistent with bladder outlet obstruction and detrusor overactivity.  He elected TURP in hopes his irritative symptoms would improve.  The TURP was performed at [facility] in July 2018.  He is 3 months out and voids with an excellent stream.  He denies stress incontinence but has persistent urinary frequency, urgency with urge incontinence.  He is not sure he is still taking the Myrbetriq.   PMH: Past Medical History:  Diagnosis Date  . BPH (benign prostatic hypertrophy)   . CAD (coronary artery disease) 12/2015   Cath by Dr [name] reveals distal and small vessel CAD.  Medical therapy advised.  . 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 Diabetes mellitus without complication ([place])   . Hypertension   . Hypothyroidism   . Rheumatoid arthritis(714.0)     Surgical History: Past Surgical History:  Procedure Laterality Date  . BACK SURGERY    . CARDIAC CATHETERIZATION N/A 12/28/2015   Procedure: Left Heart Cath and Coronary Angiography;  Surgeon: Belva Crome, MD;  Location: [facility];  Service: Cardiovascular;  Laterality: N/A;  . cataracts    . PACEMAKER INSERTION  12/09/11   SJM Accent DR RF implanted by DR Allred for complete heart block and syncope  . PERMANENT PACEMAKER INSERTION N/A 12/09/2011   Procedure: PERMANENT PACEMAKER INSERTION;  Surgeon: [name], MD;  Location: [place] [facility];   Service: Cardiovascular;  Laterality: N/A;  . REPLACEMENT TOTAL KNEE      Home Medications:  Allergies as of 03/19/2017      Reactions   Celecoxib    unknown      Medication List       Accurate as of 03/19/17 11:59 PM. Always use your most recent med list.          amLODipine 5 MG tablet Commonly known as:  NORVASC Take 1 tablet (5 mg total) by mouth daily.   aspirin EC 81 MG tablet Take 81 mg by mouth daily.   atorvastatin 20 MG tablet Commonly known as:  LIPITOR Take 1 tablet (20 mg total) by mouth daily.   BLINK TEARS OP Place 1 drop into both eyes daily as needed (redness).   Co Q 10 100 MG Caps Take 100 mg by mouth daily.   CURCUMIN 95 PO Take 1 tablet by mouth daily.   leflunomide 10 MG tablet Commonly known as:  ARAVA Take 1 tablet by mouth daily.   MAGNESIUM PO Take 1 tablet by mouth daily.   metFORMIN 500 MG tablet Commonly known as:  GLUCOPHAGE Take 500 mg by mouth 2 (two) times daily.   mirabegron ER 50 MG Tb24 tablet Commonly known as:  MYRBETRIQ Take 1 tablet by mouth daily.   nebivolol 5 MG tablet Commonly known as:  BYSTOLIC Take 5 mg by mouth daily.   nitroGLYCERIN 0.4 MG SL tablet Commonly known as:  NITROSTAT DISSOLVE 1  TABLET UNDER THE TONGUE EVERY 5 MINUTES AS NEEDED FOR CHEST PAIN.   NONFORMULARY OR COMPOUNDED ITEM [facility]: Onychomycosis Nail Lacquer - Fluconazole 2%, Terbinafine 1%, DMSO, apply 1-2 grams to affected area daily.   OMEGA 3 PO Take 1 tablet by mouth 2 (two) times daily. Omega 3 XL   RENU SALINE Soln Place 1 drop into both eyes daily as needed (dry eyes).   TERBINAFINE HCL EX Apply 1 application topically daily as needed (fungus on toenails).   thyroid 90 MG tablet Commonly known as:  ARMOUR Take 90 mg by mouth daily.       Allergies:  Allergies  Allergen Reactions  . Celecoxib     unknown     Family History: Family History  Problem Relation Age of Onset  . Pancreatic cancer Mother    . Bone cancer Father   . Alzheimer's disease Sister   . Heart attack Paternal Uncle   . Arthritis Maternal Grandfather   . Alzheimer's disease Paternal Grandmother   . Lung cancer Paternal Grandfather     Social History:  reports that he has quit smoking. He has quit using smokeless tobacco. He reports that he drinks alcohol. He reports that he does not use drugs.  ROS: UROLOGY Frequent Urination?: Yes Hard to postpone urination?: Yes Burning/pain with urination?: No Get up at night to urinate?: Yes Leakage of urine?: Yes Urine stream starts and stops?: Yes Trouble starting stream?: No Do you have to strain to urinate?: No Blood in urine?: No Urinary tract infection?: No Sexually transmitted disease?: No Injury to kidneys or bladder?: No Painful intercourse?: No Weak stream?: No Erection problems?: No Penile pain?: No  Gastrointestinal Nausea?: No Vomiting?: No Indigestion/heartburn?: No Diarrhea?: No Constipation?: No  Constitutional Fever: No Night sweats?: No Weight loss?: No Fatigue?: Yes  Skin Skin rash/lesions?: No Itching?: No  Eyes Blurred vision?: No Double vision?: No  Ears/Nose/Throat Sore throat?: No Sinus problems?: No  Hematologic/Lymphatic Swollen glands?: No Easy bruising?: No  Cardiovascular Leg swelling?: No Chest pain?: No  Respiratory Cough?: No Shortness of breath?: No  Endocrine Excessive thirst?: No  Musculoskeletal Back pain?: No Joint pain?: No  Neurological Headaches?: No Dizziness?: No  Psychologic Depression?: No Anxiety?: No  Physical Exam: BP (!) 172/79   Pulse (!) 101   Ht _0  (1.702 m)   Wt 184 lb (83.5 kg)   BMI 28.82 kg/m   Constitutional:  Alert and oriented, No acute distress. HEENT: [place] AT, moist mucus membranes.  Trachea midline, no masses. Cardiovascular: No clubbing, cyanosis, or edema. Respiratory: Normal respiratory effort, no increased work of breathing. GI: Abdomen is soft,  nontender, nondistended, no abdominal masses GU: No CVA tenderness.  Skin: No rashes, bruises or suspicious lesions. Lymph: No cervical or inguinal adenopathy. Neurologic: Grossly intact, no focal deficits, moving all 4 extremities. Psychiatric: Normal mood and affect.  Laboratory Data: Lab Results  Component Value Date   WBC 9.6 09/15/2016   HGB 11.4 (L) 09/15/2016   HCT 32.8 (L) 09/15/2016   MCV 88.3 09/15/2016   PLT 210 09/15/2016    Lab Results  Component Value Date   CREATININE 1.22 09/15/2016    Urinalysis Lab Results  Component Value Date   SPECGRAV 1.020 03/19/2017   PHUR 5.5 03/19/2017   COLORU Yellow 03/19/2017   APPEARANCEUR Clear 03/19/2017   LEUKOCYTESUR 1+ (A) 03/19/2017   PROTEINUR 1+ (A) 03/19/2017   GLUCOSEU Negative 03/19/2017   KETONESU Negative 03/19/2017   RBCU Negative 03/19/2017  BILIRUBINUR Negative 03/19/2017   UUROB 0.2 03/19/2017   NITRITE Negative 03/19/2017    Lab Results  Component Value Date   LABMICR See below: 03/19/2017   WBCUA 6-10 (A) 03/19/2017   RBCUA None seen 03/19/2017   LABEPIT None seen 03/19/2017   MUCUS Present (A) 03/19/2017   BACTERIA Few (A) 03/19/2017     Assessment & Plan:    1. Benign prostatic hyperplasia, unspecified whether lower urinary tract symptoms present Persistent irritative voiding symptoms status post TURP.  PVR by bladder scan today was 0 mL.  Urinalysis does show 6-10 WBCs and the urine was sent for culture.  He will check to see if he is still taking Myrbetriq and if not Rx was sent to his pharmacy.  If he is taking the Myrbetriq he will call back and will start combination therapy with Vesicare.  - Urinalysis, Complete - BLADDER SCAN AMB NON-IMAGING - CULTURE, URINE COMPREHENSIVE   Return in about 6 weeks (around 04/30/2017) for Recheck.  Abbie Sons, [place] [address] [place], [place] [address] [phone]

## 2017-03-20 NOTE — Telephone Encounter (Signed)
Patient left a message on voicemail and I called him about message patient states he came by the office earlier today and some girl took care of everything for him with Dr. Bernardo Heater that he didn't need anything else and I let him know if he did he could call us back. Patient agreed.

## 2017-03-23 LAB — CULTURE, URINE COMPREHENSIVE

## 2017-03-24 ENCOUNTER — Telehealth: Payer: Self-pay | Admitting: Family Medicine

## 2017-03-24 NOTE — Telephone Encounter (Signed)
Patient notified.

## 2017-03-24 NOTE — Telephone Encounter (Signed)
-----  Message from Abbie Sons, MD sent at 03/24/2017 11:12 AM EDT ----- Urine culture was negative for infection

## 2017-04-02 ENCOUNTER — Encounter: Payer: Self-pay | Admitting: Nurse Practitioner

## 2017-04-02 ENCOUNTER — Ambulatory Visit (INDEPENDENT_AMBULATORY_CARE_PROVIDER_SITE_OTHER): Payer: Medicare Other | Admitting: Nurse Practitioner

## 2017-04-02 VITALS — BP 130/60 | HR 67 | Ht 67.0 in | Wt 186.4 lb

## 2017-04-02 DIAGNOSIS — I480 Paroxysmal atrial fibrillation: Secondary | ICD-10-CM

## 2017-04-02 DIAGNOSIS — R0602 Shortness of breath: Secondary | ICD-10-CM | POA: Diagnosis not present

## 2017-04-02 DIAGNOSIS — I10 Essential (primary) hypertension: Secondary | ICD-10-CM

## 2017-04-02 DIAGNOSIS — I442 Atrioventricular block, complete: Secondary | ICD-10-CM

## 2017-04-02 LAB — CUP PACEART INCLINIC DEVICE CHECK
Date Time Interrogation Session: 20181108113951
Implantable Lead Implant Date: 20130716
Implantable Lead Implant Date: 20130716
Implantable Lead Location: 753859
Implantable Lead Location: 753860
Implantable Lead Model: 1948
Implantable Pulse Generator Implant Date: 20130716
Pulse Gen Model: 2210
Pulse Gen Serial Number: 7356500

## 2017-04-02 NOTE — Progress Notes (Signed)
Electrophysiology Office Note Date: 04/03/2017  ID:  Cameron [name], DOB 1941-02-22, MRN [id number]  PCP: [name], MD Electrophysiologist: [name]  CC: Pacemaker follow-up  Cameron [name] is a 76 y.o. male seen today for Dr [name].  He presents today for routine electrophysiology followup.  Since last being seen in our clinic, the patient reports doing reasonably well.  He recently travelled to [place] and while there developed a GI bug. He recovered and completed the trip.  He came home and has developed shortness of breath with exertion and feelings of "weakness". He also has noted LE edema L>R.  He denies chest pain, palpitations, orthopnea, nausea, vomiting, dizziness, syncope, weight gain, or early satiety.  Device History: STJ dual chamber PPM implanted 2013 for complete heart block    Past Medical History:  Diagnosis Date  . BPH (benign prostatic hypertrophy)   . CAD (coronary artery disease) 12/2015   Cath by Dr [name] reveals distal and small vessel CAD.  Medical therapy advised.  . 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 Diabetes mellitus without complication ([place])   . Hypertension   . Hypothyroidism   . Rheumatoid arthritis(714.0)    Past Surgical History:  Procedure Laterality Date  . BACK SURGERY    . cataracts    . PACEMAKER INSERTION  12/09/11   SJM Accent DR RF implanted by DR Allred for complete heart block and syncope  . REPLACEMENT TOTAL KNEE      Current Outpatient Medications  Medication Sig Dispense Refill  . Omega-3 Fatty Acids (OMEGA 3 PO) Take 1 tablet 2 (two) times daily by mouth.    Marland Kitchen amLODipine (NORVASC) 5 MG tablet Take 1 tablet (5 mg total) by mouth daily. 30 tablet 5  . aspirin EC 81 MG tablet Take 81 mg by mouth daily.    . Coenzyme Q10 (CO Q 10) 100 MG CAPS Take 100 mg by mouth daily.    Marland Kitchen leflunomide (ARAVA) 10 MG tablet Take 1 tablet by mouth daily.    Marland Kitchen lisinopril (PRINIVIL,ZESTRIL) 5 MG tablet Take 5 mg daily by mouth.    .  magnesium oxide (MAG-OX) 400 MG tablet Take 400 mg daily by mouth.    Marland Kitchen MAGNESIUM PO Take 1 tablet by mouth daily.     . metFORMIN (GLUCOPHAGE) 500 MG tablet Take 500 mg by mouth 2 (two) times daily.    . mirabegron ER (MYRBETRIQ) 50 MG TB24 tablet Take 1 tablet (50 mg total) by mouth daily. 30 tablet 3  . Misc Natural Products (TURMERIC CURCUMIN) CAPS Take 2 capsules daily by mouth.    . nebivolol (BYSTOLIC) 5 MG tablet Take 5 mg by mouth daily.    . nitroGLYCERIN (NITROSTAT) 0.4 MG SL tablet DISSOLVE 1 TABLET UNDER THE TONGUE EVERY 5 MINUTES AS NEEDED FOR CHEST PAIN. 75 tablet 1  . NONFORMULARY OR COMPOUNDED ITEM [facility]: Onychomycosis Nail Lacquer - Fluconazole 2%, Terbinafine 1%, DMSO, apply 1-2 grams to affected area daily.    Marland Kitchen thyroid (ARMOUR) 90 MG tablet Take 90 mg by mouth daily.    . Turmeric (CURCUMIN 95 PO) Take 1 tablet by mouth daily.     No current facility-administered medications for this visit.     Allergies:   Celecoxib   Social History: Social History   Socioeconomic History  . Marital status: Married    Spouse name: Not on file  . Number of children: Not on file  . Years of  education: Not on file  . Highest education level: Not on file  Social Needs  . Financial resource strain: Not on file  . Food insecurity - worry: Not on file  . Food insecurity - inability: Not on file  . Transportation needs - medical: Not on file  . Transportation needs - non-medical: Not on file  Occupational History  . Not on file  Tobacco Use  . Smoking status: Former Research scientist (life sciences)  . Smokeless tobacco: Former Network engineer and Sexual Activity  . Alcohol use: Yes    Alcohol/week: 0.0 oz  . Drug use: No  . Sexual activity: Not on file  Other Topics Concern  . Not on file  Social History Narrative  . Not on file    Family History: Family History  Problem Relation Age of Onset  . Pancreatic cancer Mother   . Bone cancer Father   . Alzheimer's disease Sister   .  Heart attack Paternal Uncle   . Arthritis Maternal Grandfather   . Alzheimer's disease Paternal Grandmother   . Lung cancer Paternal Grandfather      Review of Systems: All other systems reviewed and are otherwise negative except as noted above.   Physical Exam: VS:  BP 130/60   Pulse 67   Ht _0  (1.702 m)   Wt 186 lb 6.4 oz (84.6 kg)   SpO2 99%   BMI 29.19 kg/m  , BMI Body mass index is 29.19 kg/m.  GEN- The patient is fatigued appearing, alert and oriented x 3 today.   HEENT: normocephalic, atraumatic; sclera clear, conjunctiva pink; hearing intact; oropharynx clear; neck supple  Lungs- Clear to ausculation bilaterally, normal work of breathing.  No wheezes, rales, rhonchi Heart- Regular rate and rhythm GI- soft, non-tender, non-distended, bowel sounds present  Extremities- no clubbing, cyanosis, or edema  MS- no significant deformity or atrophy Skin- warm and dry, no rash or lesion; PPM pocket well healed Psych- euthymic mood, full affect Neuro- strength and sensation are intact  PPM Interrogation- reviewed in detail today,  See PACEART report  EKG:  EKG is ordered today. EKG today shows AV pacing   Recent Labs: 09/13/2016: TSH 0.174 04/02/2017: BUN 21; Creatinine, Ser 1.09; Hemoglobin 13.7; NT-Pro BNP 557; Platelets 209; Potassium 5.1; Sodium 141   Wt Readings from Last 3 Encounters:  04/02/17 186 lb 6.4 oz (84.6 kg)  03/19/17 184 lb (83.5 kg)  09/09/16 184 lb (83.5 kg)     Other studies Reviewed: Additional studies/ records that were reviewed today include: office notes  Assessment and Plan:  1.  Complete heart block Normal PPM function See Pace Art report No changes today  2.  HTN Stable No change required today  3.  CAD No recent ischemic symptoms Continue medical therapy  4.  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  Shortness of breath/LE edema In the setting of recent travel as well as chronic RV pacing Will check labs  including D-dimer today Update echo Follow up in 4-6 weeks with me or Dr [name]     Current medicines are reviewed at length with the patient today.   The patient does not have concerns regarding his medicines.  The following changes were made today:  none  Labs/ tests ordered today include: none Orders Placed This Encounter  Procedures  . Basic metabolic panel  . Pro b natriuretic peptide (BNP)  . CBC  . D-dimer, quantitative (not at [facility])  . CUP PACEART INCLINIC DEVICE  CHECK  . ECHOCARDIOGRAM COMPLETE     Disposition:   Follow up with me or Dr [name] 4-6 weeks, [place]     Signed, Chanetta Marshall, NP 04/03/2017 7:03 AM  [place] [address] [phone] (office) [phone] (fax)

## 2017-04-02 NOTE — Patient Instructions (Addendum)
Medication Instructions:   Your physician recommends that you continue on your current medications as directed. Please refer to the Current Medication list given to you today.   If you need a refill on your cardiac medications before your next appointment, please call your pharmacy.  Labwork: BMET  BNP CBC    Testing/Procedures:    Your physician has requested that you have an echocardiogram. Echocardiography is a painless test that uses sound waves to create images of your heart. It provides your doctor with information about the size and shape of your heart and how well your heart's chambers and valves are working. This procedure takes approximately one hour. There are no restrictions for this procedure.     Follow-Up:   Your physician wants you to follow-up in: [place] will receive a reminder letter in the mail two months in advance. If you don't receive a letter, please call our office to schedule the follow-up appointment.    Any Other Special Instructions Will Be Listed Below (If Applicable).

## 2017-04-03 ENCOUNTER — Telehealth: Payer: Self-pay | Admitting: Nurse Practitioner

## 2017-04-03 ENCOUNTER — Ambulatory Visit (HOSPITAL_COMMUNITY)
Admission: RE | Admit: 2017-04-03 | Discharge: 2017-04-03 | Disposition: A | Discharge status: Home / Self Care | Payer: Medicare Other | Source: Ambulatory Visit | Attending: Nurse Practitioner | Admitting: Nurse Practitioner

## 2017-04-03 ENCOUNTER — Ambulatory Visit (INDEPENDENT_AMBULATORY_CARE_PROVIDER_SITE_OTHER)
Admission: RE | Admit: 2017-04-03 | Discharge: 2017-04-03 | Disposition: A | Discharge status: Home / Self Care | Payer: Medicare Other | Source: Ambulatory Visit | Attending: Nurse Practitioner | Admitting: Nurse Practitioner

## 2017-04-03 ENCOUNTER — Telehealth: Payer: Self-pay

## 2017-04-03 DIAGNOSIS — R7989 Other specified abnormal findings of blood chemistry: Secondary | ICD-10-CM

## 2017-04-03 DIAGNOSIS — R936 Abnormal findings on diagnostic imaging of limbs: Secondary | ICD-10-CM | POA: Diagnosis not present

## 2017-04-03 HISTORY — DX: Other specified abnormal findings of blood chemistry: R79.89

## 2017-04-03 LAB — BASIC METABOLIC PANEL
BUN/Creatinine Ratio: 19 (ref 10–24)
BUN: 21 mg/dL (ref 8–27)
CO2: 27 mmol/L (ref 20–29)
Calcium: 10.1 mg/dL (ref 8.6–10.2)
Chloride: 102 mmol/L (ref 96–106)
Creatinine, Ser: 1.09 mg/dL (ref 0.76–1.27)
GFR calc Af Amer: 75 mL/min/{1.73_m2} (ref >59)
GFR calc non Af Amer: 65 mL/min/{1.73_m2} (ref >59)
Glucose: 85 mg/dL (ref 65–99)
Potassium: 5.1 mmol/L (ref 3.5–5.2)
Sodium: 141 mmol/L (ref 134–144)

## 2017-04-03 LAB — CBC
Hematocrit: 40.5 % (ref 37.5–51.0)
Hemoglobin: 13.7 g/dL (ref 13.0–17.7)
MCH: 31.1 pg (ref 26.6–33.0)
MCHC: 33.8 g/dL (ref 31.5–35.7)
MCV: 92 fL (ref 79–97)
Platelets: 209 10*3/uL (ref 150–379)
RBC: 4.4 x10E6/uL (ref 4.14–5.80)
RDW: 13.8 % (ref 12.3–15.4)
WBC: 6.7 10*3/uL (ref 3.4–10.8)

## 2017-04-03 LAB — PRO B NATRIURETIC PEPTIDE: NT-Pro BNP: 557 pg/mL — ABNORMAL HIGH (ref 0–486)

## 2017-04-03 LAB — D-DIMER, QUANTITATIVE: D-DIMER: 2.27 mg/L FEU — ABNORMAL HIGH (ref 0.00–0.49)

## 2017-04-03 MED ORDER — IOPAMIDOL (ISOVUE-370) INJECTION 76%
80.0000 mL | Freq: Once | INTRAVENOUS | Status: AC | PRN
  Administered 2017-04-03: 80 mL via INTRAVENOUS

## 2017-04-03 NOTE — Progress Notes (Signed)
*  PRELIMINARY RESULTS* Vascular Ultrasound Bilateral lower extremity venous duplex has been completed.  Preliminary findings: No evidence of deep vein thrombosis bilaterally. 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  Attempted to call Chanetta Marshall @ 16:48, left a message with receptionist.   Everrett Coombe 04/03/2017, 4:46 PM

## 2017-04-03 NOTE — Telephone Encounter (Signed)
Patient made aware of results, ordered placed for CT scan and dopplers. Patient scheduled for CT scan today and sent to scheduler to make appointment for dopplers. Patient was at his pcp at time of call and gave permission to speak with Grayland Ormond, PA regarding lab results. Patient is in agreement with plan and thanked me for the call.

## 2017-04-03 NOTE — Telephone Encounter (Signed)
-----  Message from Patsey Berthold, NP sent at 04/03/2017  7:03 AM EST ----- D-dimer elevated in the setting of recent travel and now shortness of breath with exertion.  Please order CT scan to rule out PE as well as LE dopplers for asymmetric LE edema.  Other labs ok. Thank you!

## 2017-04-03 NOTE — Telephone Encounter (Signed)
New message  Pt verbalized that he is returning call for the rn

## 2017-04-03 NOTE — Telephone Encounter (Signed)
Spoke with [facility] [phone] who gave a preliminary result of the vascular LE study.  Findings: no evidence of DVT.  They tried to reach [employer] [facility] NP, but were unsuccessful.  The study has not been officialy read yet.

## 2017-04-03 NOTE — Telephone Encounter (Signed)
Lyndee Leo from  [facility] is calling with results of a LE Venous . DVT Check . Please call at [phone]

## 2017-04-03 NOTE — Telephone Encounter (Signed)
Cameron [name], please inform patient that doppler and CT are both without finding of thrombosis.  Also inform him of results of CT. Thanks!

## 2017-04-03 NOTE — Telephone Encounter (Signed)
New message    Pt is returning call about lab results.

## 2017-04-06 NOTE — Telephone Encounter (Signed)
LPM 11/12 md

## 2017-04-07 NOTE — Telephone Encounter (Signed)
Spoke with patient and informed him of CT results. He verbalized understanding.

## 2017-04-14 ENCOUNTER — Ambulatory Visit (HOSPITAL_COMMUNITY): Payer: Medicare Other | Attending: Cardiology

## 2017-04-14 ENCOUNTER — Other Ambulatory Visit: Payer: Self-pay

## 2017-04-14 DIAGNOSIS — R0602 Shortness of breath: Secondary | ICD-10-CM | POA: Insufficient documentation

## 2017-04-14 DIAGNOSIS — I081 Rheumatic disorders of both mitral and tricuspid valves: Secondary | ICD-10-CM | POA: Diagnosis not present

## 2017-04-30 ENCOUNTER — Ambulatory Visit: Payer: Medicare Other | Admitting: Urology

## 2017-05-03 ENCOUNTER — Encounter: Payer: Self-pay | Admitting: Nurse Practitioner

## 2017-05-06 ENCOUNTER — Encounter: Payer: Self-pay | Admitting: Urology

## 2017-05-06 ENCOUNTER — Ambulatory Visit (INDEPENDENT_AMBULATORY_CARE_PROVIDER_SITE_OTHER): Payer: Medicare Other | Admitting: Urology

## 2017-05-06 VITALS — BP 161/74 | HR 96 | Ht 67.0 in | Wt 185.0 lb

## 2017-05-06 DIAGNOSIS — N3281 Overactive bladder: Secondary | ICD-10-CM | POA: Insufficient documentation

## 2017-05-06 MED ORDER — MIRABEGRON ER 50 MG PO TB24: 50.0000 mg | ORAL_TABLET | Freq: Every day | ORAL | 11 refills | Status: DC

## 2017-05-06 NOTE — Progress Notes (Signed)
05/06/2017 1:22 PM   Cameron [name] 05/24/1941 [id number]  Referring provider: [name], MD [place] [facility] [place], [place] [address]  Chief Complaint  Patient presents with  . Benign Prostatic Hypertrophy    HPI: 76 year old male presents for follow-up.  He had persistent storage related symptoms after TURP.  Preoperative urodynamic study did show detrusor overactivity with outlet obstruction.  He was given a trial of Myrbetriq and has noted significant improvement in his voiding pattern with less frequency, less urgency and rare episodes of urge incontinence.  He is currently satisfied with his voiding pattern.   PMH: Past Medical History:  Diagnosis Date  . BPH (benign prostatic hypertrophy)   . CAD (coronary artery disease) 12/2015   Cath by Dr [name] reveals distal and small vessel CAD.  Medical therapy advised.  . 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 Diabetes mellitus without complication ([place])   . Hypertension   . Hypothyroidism   . Rheumatoid arthritis(714.0)     Surgical History: Past Surgical History:  Procedure Laterality Date  . BACK SURGERY    . CARDIAC CATHETERIZATION N/A 12/28/2015   Procedure: Left Heart Cath and Coronary Angiography;  Surgeon: Belva Crome, MD;  Location: [place] CV [facility];  Service: Cardiovascular;  Laterality: N/A;  . cataracts    . PACEMAKER INSERTION  12/09/11   SJM Accent DR RF implanted by DR Allred for complete heart block and syncope  . PERMANENT PACEMAKER INSERTION N/A 12/09/2011   Procedure: PERMANENT PACEMAKER INSERTION;  Surgeon: [name], MD;  Location: [facility] CATH LAB;  Service: Cardiovascular;  Laterality: N/A;  . REPLACEMENT TOTAL KNEE      Home Medications:  Allergies as of 05/06/2017      Reactions   Celecoxib    unknown      Medication List        Accurate as of 05/06/17  1:22 PM. Always use your most recent med list.          amLODipine 5 MG tablet Commonly known  as:  NORVASC Take 1 tablet (5 mg total) by mouth daily.   aspirin EC 81 MG tablet Take 81 mg by mouth daily.   Co Q 10 100 MG Caps Take 100 mg by mouth daily.   CURCUMIN 95 PO Take 1 tablet by mouth daily.   leflunomide 10 MG tablet Commonly known as:  ARAVA Take 1 tablet by mouth daily.   lisinopril 5 MG tablet Commonly known as:  PRINIVIL,ZESTRIL Take 5 mg daily by mouth.   magnesium oxide 400 MG tablet Commonly known as:  MAG-OX Take 400 mg daily by mouth.   MAGNESIUM PO Take 1 tablet by mouth daily.   metFORMIN 500 MG tablet Commonly known as:  GLUCOPHAGE Take 500 mg by mouth 2 (two) times daily.   mirabegron ER 50 MG Tb24 tablet Commonly known as:  MYRBETRIQ Take 1 tablet (50 mg total) by mouth daily.   nebivolol 5 MG tablet Commonly known as:  BYSTOLIC Take 5 mg by mouth daily.   nitroGLYCERIN 0.4 MG SL tablet Commonly known as:  NITROSTAT DISSOLVE 1 TABLET UNDER THE TONGUE EVERY 5 MINUTES AS NEEDED FOR CHEST PAIN.   NONFORMULARY OR COMPOUNDED ITEM [facility]: Onychomycosis Nail Lacquer - Fluconazole 2%, Terbinafine 1%, DMSO, apply 1-2 grams to affected area daily.   OMEGA 3 PO Take 1 tablet 2 (two) times daily by mouth.   thyroid 90 MG tablet Commonly  known as:  ARMOUR Take 90 mg by mouth daily.   Turmeric Curcumin Caps Take 2 capsules daily by mouth.       Allergies:  Allergies  Allergen Reactions  . Celecoxib     unknown     Family History: Family History  Problem Relation Age of Onset  . Pancreatic cancer Mother   . Bone cancer Father   . Alzheimer's disease Sister   . Heart attack Paternal Uncle   . Arthritis Maternal Grandfather   . Alzheimer's disease Paternal Grandmother   . Lung cancer Paternal Grandfather     Social History:  reports that he has quit smoking. He has quit using smokeless tobacco. He reports that he drinks alcohol. He reports that he does not use drugs.  ROS: UROLOGY Frequent Urination?: No Hard  to postpone urination?: No Burning/pain with urination?: No Get up at night to urinate?: No Leakage of urine?: Yes Urine stream starts and stops?: No Trouble starting stream?: No Do you have to strain to urinate?: No Blood in urine?: No Urinary tract infection?: No Sexually transmitted disease?: No Injury to kidneys or bladder?: No Painful intercourse?: No Weak stream?: No Erection problems?: No Penile pain?: No  Gastrointestinal Nausea?: No Vomiting?: No Indigestion/heartburn?: No Diarrhea?: No Constipation?: No  Constitutional Fever: No Night sweats?: No Weight loss?: No Fatigue?: No  Skin Skin rash/lesions?: No Itching?: No  Eyes Blurred vision?: No Double vision?: No  Ears/Nose/Throat Sore throat?: No Sinus problems?: No  Hematologic/Lymphatic Swollen glands?: No Easy bruising?: No  Cardiovascular Leg swelling?: Yes Chest pain?: No  Respiratory Cough?: Yes Shortness of breath?: Yes  Endocrine Excessive thirst?: No  Musculoskeletal Back pain?: No Joint pain?: No  Neurological Headaches?: No Dizziness?: No  Psychologic Depression?: No Anxiety?: No  Physical Exam: BP (!) 161/74   Pulse 96   Ht _0  (1.702 m)   Wt 185 lb (83.9 kg)   BMI 28.98 kg/m   Constitutional:  Alert and oriented, No acute distress. HEENT: [place] AT, moist mucus membranes.  Trachea midline, no masses. Cardiovascular: No clubbing, cyanosis, or edema. Respiratory: Normal respiratory effort, no increased work of breathing. GI: Abdomen is soft, nontender, nondistended, no  Skin: No rashes, bruises or suspicious lesions. Lymph: No cervical or inguinal adenopathy. Neurologic: Grossly intact, no focal deficits, moving all 4 extremities. Psychiatric: Normal mood and affect.  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  Rx was sent to his pharmacy.  Follow-up 6 months.   Return in about 6 months (around 11/04/2017) for Recheck.    Abbie Sons, [place] [address], [place], [place] [address] [phone]

## 2017-05-08 ENCOUNTER — Encounter: Payer: Self-pay | Admitting: Nurse Practitioner

## 2017-05-12 NOTE — Progress Notes (Signed)
Electrophysiology Office Note Date: 05/14/2017  ID:  Cameron, [name] 1940/11/29, MRN [id number]  PCP: [name], MD Electrophysiologist: [name]  CC: Pacemaker follow-up  Cameron [name] is a 76 y.o. male seen today for Dr [name].  He presents today for routine electrophysiology followup.  Since last being seen in our clinic, the patient reports doing relatively well. He presents today for follow up of shortness of breath.  At last office visit, echo and D-dimer was ordered to evaluate following long overseas trip. Echo was normal, D-dimer was elevated. LE dopplers and CTA were negative. He continues with exertional shortness of breath and fatigue that is different from baseline. He also has continued RLE edema for which he is working with physical therapy and is planning to get compression hose. He denies chest pain, palpitations, orthopnea, nausea, vomiting, dizziness, syncope, weight gain, or early satiety.  Device History: STJ dual chamber PPM implanted 2013 for complete heart block    Past Medical History:  Diagnosis Date  . BPH (benign prostatic hypertrophy)   . CAD (coronary artery disease) 12/2015   Cath by Dr [name] reveals distal and small vessel CAD.  Medical therapy advised.  . 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 Diabetes mellitus without complication ([place])   . Hypertension   . Hypothyroidism   . Rheumatoid arthritis(714.0)    Past Surgical History:  Procedure Laterality Date  . BACK SURGERY    . CARDIAC CATHETERIZATION N/A 12/28/2015   Procedure: Left Heart Cath and Coronary Angiography;  Surgeon: Belva Crome, MD;  Location: [place] [facility];  Service: Cardiovascular;  Laterality: N/A;  . cataracts    . PACEMAKER INSERTION  12/09/11   SJM Accent DR RF implanted by DR Allred for complete heart block and syncope  . PERMANENT PACEMAKER INSERTION N/A 12/09/2011   Procedure: PERMANENT PACEMAKER INSERTION;  Surgeon: [name], MD;  Location: [facility] CATH  LAB;  Service: Cardiovascular;  Laterality: N/A;  . REPLACEMENT TOTAL KNEE      Current Outpatient Medications  Medication Sig Dispense Refill  . amLODipine (NORVASC) 5 MG tablet Take 1 tablet (5 mg total) by mouth daily. 30 tablet 5  . aspirin EC 81 MG tablet Take 81 mg by mouth daily.    . Coenzyme Q10 (CO Q 10) 100 MG CAPS Take 100 mg by mouth daily.    Marland Kitchen leflunomide (ARAVA) 10 MG tablet Take 1 tablet by mouth daily.    Marland Kitchen lisinopril (PRINIVIL,ZESTRIL) 5 MG tablet Take 5 mg daily by mouth.    . magnesium oxide (MAG-OX) 400 MG tablet Take 400 mg daily by mouth.    Marland Kitchen MAGNESIUM PO Take 1 tablet by mouth daily.     . metFORMIN (GLUCOPHAGE) 500 MG tablet Take 500 mg by mouth 2 (two) times daily.    . mirabegron ER (MYRBETRIQ) 50 MG TB24 tablet Take 1 tablet (50 mg total) by mouth daily. 30 tablet 11  . Misc Natural Products (TURMERIC CURCUMIN) CAPS Take 2 capsules daily by mouth.    . nebivolol (BYSTOLIC) 5 MG tablet Take 5 mg by mouth daily.    . nitroGLYCERIN (NITROSTAT) 0.4 MG SL tablet DISSOLVE 1 TABLET UNDER THE TONGUE EVERY 5 MINUTES AS NEEDED FOR CHEST PAIN. 75 tablet 1  . NONFORMULARY OR COMPOUNDED ITEM [facility]: Onychomycosis Nail Lacquer - Fluconazole 2%, Terbinafine 1%, DMSO, apply 1-2 grams to affected area daily.    . Omega-3 Fatty Acids (OMEGA 3 PO)  Take 1 tablet 2 (two) times daily by mouth.    . thyroid (ARMOUR) 90 MG tablet Take 90 mg by mouth daily.    . Turmeric (CURCUMIN 95 PO) Take 1 tablet by mouth daily.     No current facility-administered medications for this visit.     Allergies:   Celecoxib   Social History: Social History   Socioeconomic History  . Marital status: Married    Spouse name: Not on file  . Number of children: Not on file  . Years of education: Not on file  . Highest education level: Not on file  Social Needs  . Financial resource strain: Not on file  . Food insecurity - worry: Not on file  . Food insecurity - inability: Not on  file  . Transportation needs - medical: Not on file  . Transportation needs - non-medical: Not on file  Occupational History  . Not on file  Tobacco Use  . Smoking status: Former Research scientist (life sciences)  . Smokeless tobacco: Former Network engineer and Sexual Activity  . Alcohol use: Yes    Alcohol/week: 0.0 oz  . Drug use: No  . Sexual activity: Not on file  Other Topics Concern  . Not on file  Social History Narrative  . Not on file    Family History: Family History  Problem Relation Age of Onset  . Pancreatic cancer Mother   . Bone cancer Father   . Alzheimer's disease Sister   . Heart attack Paternal Uncle   . Arthritis Maternal Grandfather   . Alzheimer's disease Paternal Grandmother   . Lung cancer Paternal Grandfather      Review of Systems: All other systems reviewed and are otherwise negative except as noted above.   Physical Exam: VS:  BP (!) 170/82 (BP Location: Right Arm, Patient Position: Sitting, Cuff Size: Normal)   Pulse 62   Ht _0  (1.702 m)   Wt 190 lb (86.2 kg)   SpO2 98%   BMI 29.76 kg/m  , BMI Body mass index is 29.76 kg/m.  GEN- The patient is elderly appearing, alert and oriented x 3 today.   HEENT: normocephalic, atraumatic; sclera clear, conjunctiva pink; hearing intact; oropharynx clear; neck supple  Lungs- Clear to ausculation bilaterally, normal work of breathing.  No wheezes, rales, rhonchi Heart- Regular rate and rhythm GI- soft, non-tender, non-distended, bowel sounds present  Extremities- no clubbing, cyanosis, or edema  MS- no significant deformity or atrophy Skin- warm and dry, no rash or lesion; PPM pocket well healed Psych- euthymic mood, full affect Neuro- strength and sensation are intact  PPM Interrogation- reviewed in detail today,  See PACEART report  EKG:  EKG is ordered today. EKG today shows AV pacing   Recent Labs: 09/13/2016: TSH 0.174 04/02/2017: BUN 21; Creatinine, Ser 1.09; Hemoglobin 13.7; NT-Pro BNP 557; Platelets 209;  Potassium 5.1; Sodium 141   Wt Readings from Last 3 Encounters:  05/14/17 190 lb (86.2 kg)  05/06/17 185 lb (83.9 kg)  04/02/17 186 lb 6.4 oz (84.6 kg)     Other studies Reviewed: Additional studies/ records that were reviewed today include: office notes  Assessment and Plan:  1.  Complete heart block Normal PPM function See Pace Art report No changes today  2.  HTN BP elevated today Increase Lisinopril to 74m daily BMET when returns for myoview   3.  CAD See below  4.  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  Shortness of breath Persistent  Will proceed with lexiscan myoview to rule out progression of CAD as cause If myoview normal, consider addition of Lasix    Current medicines are reviewed at length with the patient today.   The patient does not have concerns regarding his medicines.  The following changes were made today:  Increase Lisinopril to 42m daily   Labs/ tests ordered today include: myoview, BMET in 1-2 weeks    Disposition:   Follow up with MDelilah Shan Dr [name]1 year    Signed, AChanetta Marshall NP 05/14/2017 9:16 AM  [place] [phone](office) [phone](fax)

## 2017-05-14 ENCOUNTER — Ambulatory Visit (INDEPENDENT_AMBULATORY_CARE_PROVIDER_SITE_OTHER): Payer: Medicare Other | Admitting: Nurse Practitioner

## 2017-05-14 ENCOUNTER — Encounter: Payer: Self-pay | Admitting: Nurse Practitioner

## 2017-05-14 VITALS — BP 170/82 | HR 62 | Ht 67.0 in | Wt 190.0 lb

## 2017-05-14 DIAGNOSIS — I442 Atrioventricular block, complete: Secondary | ICD-10-CM

## 2017-05-14 DIAGNOSIS — I25119 Atherosclerotic heart disease of native coronary artery with unspecified angina pectoris: Secondary | ICD-10-CM | POA: Diagnosis not present

## 2017-05-14 DIAGNOSIS — I480 Paroxysmal atrial fibrillation: Secondary | ICD-10-CM

## 2017-05-14 DIAGNOSIS — R0602 Shortness of breath: Secondary | ICD-10-CM | POA: Diagnosis not present

## 2017-05-14 DIAGNOSIS — I10 Essential (primary) hypertension: Secondary | ICD-10-CM

## 2017-05-14 DIAGNOSIS — I209 Angina pectoris, unspecified: Secondary | ICD-10-CM

## 2017-05-14 MED ORDER — LISINOPRIL 10 MG PO TABS: 10.0000 mg | ORAL_TABLET | Freq: Every day | ORAL | 3 refills | Status: DC

## 2017-05-14 NOTE — Telephone Encounter (Signed)
Tried to reach pt about moving his appointment to 05/15/2017, from 05/18/2017.

## 2017-05-14 NOTE — Patient Instructions (Addendum)
Medication Instructions:   START TAKING LISINOPRIL  10 MG ONCE A DAY   If you need a refill on your cardiac medications before your next appointment, please call your pharmacy.  Labwork:  BMET ON SAME DAY AS STRESS  TEST   Testing/Procedures:  Your physician has requested that you have a lexiscan myoview. For further information please visit [email or web address]. Please follow instruction sheet, as given.     Follow-Up: BASED UPON STREE TEST RESULTS    Remote monitoring is used to monitor your Pacemaker of ICD from home. This monitoring reduces the number of office visits required to check your device to one time per year. It allows [place] to keep an eye on the functioning of your device to ensure it is working properly. You are scheduled for a device check from home on . 08-12-17  You may send your transmission at any time that day. If you have a wireless device, the transmission will be sent automatically. After your physician reviews your transmission, you will receive a postcard with your next transmission date.     Any Other Special Instructions Will Be Listed Below (If Applicable).

## 2017-05-15 ENCOUNTER — Telehealth: Payer: Self-pay | Admitting: *Deleted

## 2017-05-15 ENCOUNTER — Ambulatory Visit (HOSPITAL_COMMUNITY): Payer: Medicare Other | Attending: Internal Medicine

## 2017-05-15 ENCOUNTER — Other Ambulatory Visit: Payer: Medicare Other | Admitting: *Deleted

## 2017-05-15 DIAGNOSIS — I25119 Atherosclerotic heart disease of native coronary artery with unspecified angina pectoris: Secondary | ICD-10-CM

## 2017-05-15 DIAGNOSIS — I442 Atrioventricular block, complete: Secondary | ICD-10-CM

## 2017-05-15 DIAGNOSIS — R0602 Shortness of breath: Secondary | ICD-10-CM

## 2017-05-15 DIAGNOSIS — I447 Left bundle-branch block, unspecified: Secondary | ICD-10-CM | POA: Insufficient documentation

## 2017-05-15 DIAGNOSIS — R079 Chest pain, unspecified: Secondary | ICD-10-CM | POA: Diagnosis not present

## 2017-05-15 DIAGNOSIS — R400 Somnolence: Secondary | ICD-10-CM

## 2017-05-15 DIAGNOSIS — E119 Type 2 diabetes mellitus without complications: Secondary | ICD-10-CM | POA: Insufficient documentation

## 2017-05-15 DIAGNOSIS — R0681 Apnea, not elsewhere classified: Secondary | ICD-10-CM

## 2017-05-15 LAB — MYOCARDIAL PERFUSION IMAGING
LV dias vol: 134 mL (ref 62–150)
LV sys vol: 68 mL
Peak HR: 68 {beats}/min
RATE: 0.31
Rest HR: 60 {beats}/min
SDS: 1
SRS: 2
SSS: 3
TID: 1

## 2017-05-15 LAB — BASIC METABOLIC PANEL
BUN/Creatinine Ratio: 23 (ref 10–24)
BUN: 23 mg/dL (ref 8–27)
CO2: 24 mmol/L (ref 20–29)
Calcium: 9.4 mg/dL (ref 8.6–10.2)
Chloride: 101 mmol/L (ref 96–106)
Creatinine, Ser: 1 mg/dL (ref 0.76–1.27)
GFR calc Af Amer: 84 mL/min/{1.73_m2} (ref >59)
GFR calc non Af Amer: 72 mL/min/{1.73_m2} (ref >59)
Glucose: 150 mg/dL — ABNORMAL HIGH (ref 65–99)
Potassium: 4.5 mmol/L (ref 3.5–5.2)
Sodium: 137 mmol/L (ref 134–144)

## 2017-05-15 MED ORDER — TECHNETIUM TC 99M TETROFOSMIN IV KIT
30.8000 | PACK | Freq: Once | INTRAVENOUS | Status: AC | PRN
  Administered 2017-05-15: 30.8 via INTRAVENOUS
  Filled 2017-05-15: qty 31

## 2017-05-15 MED ORDER — REGADENOSON 0.4 MG/5ML IV SOLN
0.4000 mg | Freq: Once | INTRAVENOUS | Status: AC
  Administered 2017-05-15: 0.4 mg via INTRAVENOUS

## 2017-05-15 MED ORDER — TECHNETIUM TC 99M TETROFOSMIN IV KIT
8.9000 | PACK | Freq: Once | INTRAVENOUS | Status: AC | PRN
Start: 2017-05-15 — End: 2017-05-15
  Administered 2017-05-15: 8.9 via INTRAVENOUS
  Filled 2017-05-15: qty 9

## 2017-05-15 NOTE — Telephone Encounter (Signed)
-----  Message from Patsey Berthold, NP sent at 05/15/2017  7:02 AM EST ----- Can you call him if you get a chance? I asked Allred to look over and see if there was anything else we should do to evaluate shortness of breath. If he is willing, can you go ahead and schedule PFT's and sleep study (I don't think he has had one)?  Thank you!  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 Would order PFTs and sleep study (if he hasn't had one).  Aggressive BP control (increased lisinopril noted)  ----- Message ----- From: Patsey Berthold, NP Sent: 05/14/2017   9:20 AM To: [name], MD  Can you look at my note?  Any other thoughts on him?

## 2017-05-15 NOTE — Telephone Encounter (Addendum)
Pt agreeable to PFTs and sleep study. Pt aware office will contact him to arrange both. ESS given to sleep study assist to pre cert/arrange sleep study. Patient verbalized understanding and agreeable to plan.

## 2017-05-18 ENCOUNTER — Other Ambulatory Visit: Payer: Medicare Other

## 2017-05-18 ENCOUNTER — Encounter (HOSPITAL_COMMUNITY): Payer: Medicare Other

## 2017-05-21 ENCOUNTER — Telehealth: Payer: Self-pay | Admitting: *Deleted

## 2017-05-21 NOTE — Telephone Encounter (Signed)
Informed patient of upcoming sleep study and patient understanding was verbalized. Patient understands his sleep study is scheduled for Teusday June 16 2017. Patient understands his sleep study will be done at [facility] sleep lab. Patient understands he will receive a sleep packet in a week or so. Patient understands to call if he does not receive the sleep packet in a timely manner. Patient agrees with treatment and thanked me for call.

## 2017-05-21 NOTE — Telephone Encounter (Signed)
Sent to sleep pool today

## 2017-05-21 NOTE — Telephone Encounter (Signed)
-----  Message from Stanton Kidney, RN sent at 05/15/2017  4:53 PM EST ----- Regarding: Sleep Study Please arrange sleep study Dx: Daytime Somnolence, Apnea  Epworth Sleepiness Scale: 1.   2 2.   2 3.   2 4.   2 5.   2 6.   2 7.   1 8.   2 ----------------- Total = 15

## 2017-05-28 ENCOUNTER — Ambulatory Visit (HOSPITAL_COMMUNITY)
Admission: RE | Admit: 2017-05-28 | Discharge: 2017-05-28 | Disposition: A | Discharge status: Home / Self Care | Payer: Medicare Other | Source: Ambulatory Visit | Attending: Nurse Practitioner | Admitting: Nurse Practitioner

## 2017-05-28 DIAGNOSIS — R942 Abnormal results of pulmonary function studies: Secondary | ICD-10-CM | POA: Insufficient documentation

## 2017-05-28 DIAGNOSIS — R0602 Shortness of breath: Secondary | ICD-10-CM | POA: Diagnosis present

## 2017-05-28 LAB — PULMONARY FUNCTION TEST
DL/VA % pred: 92 %
DL/VA: 4.08 ml/min/mmHg/L
DLCO unc % pred: 71 %
DLCO unc: 20.35 ml/min/mmHg
FEF 25-75 Post: 2.99 L/sec
FEF 25-75 Pre: 3.93 L/sec
FEF2575-%Change-Post: -24 %
FEF2575-%Pred-Post: 162 %
FEF2575-%Pred-Pre: 213 %
FEV1-%Change-Post: -4 %
FEV1-%Pred-Post: 104 %
FEV1-%Pred-Pre: 109 %
FEV1-Post: 2.72 L
FEV1-Pre: 2.85 L
FEV1FVC-%Change-Post: -4 %
FEV1FVC-%Pred-Pre: 120 %
FEV6-%Change-Post: 0 %
FEV6-%Pred-Post: 96 %
FEV6-%Pred-Pre: 96 %
FEV6-Post: 3.28 L
FEV6-Pre: 3.29 L
FEV6FVC-%Change-Post: 0 %
FEV6FVC-%Pred-Post: 107 %
FEV6FVC-%Pred-Pre: 107 %
FVC-%Change-Post: 0 %
FVC-%Pred-Post: 89 %
FVC-%Pred-Pre: 90 %
FVC-Post: 3.28 L
FVC-Pre: 3.29 L
Post FEV1/FVC ratio: 83 %
Post FEV6/FVC ratio: 100 %
Pre FEV1/FVC ratio: 87 %
Pre FEV6/FVC Ratio: 100 %
RV % pred: 80 %
RV: 1.94 L
TLC % pred: 84 %
TLC: 5.47 L

## 2017-05-28 MED ORDER — ALBUTEROL SULFATE (2.5 MG/3ML) 0.083% IN NEBU
2.5000 mg | INHALATION_SOLUTION | Freq: Once | RESPIRATORY_TRACT | Status: AC
  Administered 2017-05-28: 2.5 mg via RESPIRATORY_TRACT

## 2017-06-10 ENCOUNTER — Ambulatory Visit (INDEPENDENT_AMBULATORY_CARE_PROVIDER_SITE_OTHER): Payer: Medicare Other | Admitting: Urology

## 2017-06-10 ENCOUNTER — Encounter: Payer: Self-pay | Admitting: Urology

## 2017-06-10 VITALS — BP 176/82 | HR 86 | Resp 14 | Ht 67.0 in | Wt 192.0 lb

## 2017-06-10 DIAGNOSIS — N3942 Incontinence without sensory awareness: Secondary | ICD-10-CM | POA: Diagnosis not present

## 2017-06-10 DIAGNOSIS — R350 Frequency of micturition: Secondary | ICD-10-CM | POA: Diagnosis not present

## 2017-06-10 DIAGNOSIS — N3941 Urge incontinence: Secondary | ICD-10-CM

## 2017-06-10 LAB — BLADDER SCAN AMB NON-IMAGING: Scan Result: 63

## 2017-06-10 MED ORDER — SOLIFENACIN SUCCINATE 10 MG PO TABS: 10.0000 mg | ORAL_TABLET | Freq: Every day | ORAL | 2 refills | Status: DC

## 2017-06-10 NOTE — Progress Notes (Signed)
06/10/2017 8:48 AM   Cameron [name] 1940/10/09 [id number]  Referring provider: [name], MD [facility] [facility] [place],  [address]  Chief Complaint  Patient presents with  . Urinary Frequency    HPI: 77 year-old male presents with complaints of urinary incontinence.  Refer to my last note dated 05/06/2017.  At that visit he indicated he was doing well on Myrbetriq however he states shortly after that he began to have worsening frequency, urgency with urge incontinence.  He also notes occasional unaware incontinence.  He increased his Myrbetriq to 50 mg twice daily and feels this has helped.  He voids with a good stream.  Denies dysuria or gross hematuria.  Denies flank, abdominal, pelvic or scrotal pain.   PMH: Past Medical History:  Diagnosis Date  . BPH (benign prostatic hypertrophy)   . CAD (coronary artery disease) 12/2015   Cath by Dr [name] reveals distal and small vessel CAD.  Medical therapy advised.  . 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 Diabetes mellitus without complication ([place])   . Hypertension   . Hypothyroidism   . Rheumatoid arthritis(714.0)     Surgical History: Past Surgical History:  Procedure Laterality Date  . BACK SURGERY    . CARDIAC CATHETERIZATION N/A 12/28/2015   Procedure: Left Heart Cath and Coronary Angiography;  Surgeon: Belva Crome, MD;  Location: [place] CV LAB;  Service: Cardiovascular;  Laterality: N/A;  . cataracts    . PACEMAKER INSERTION  12/09/11   SJM Accent DR RF implanted by DR Allred for complete heart block and syncope  . PERMANENT PACEMAKER INSERTION N/A 12/09/2011   Procedure: PERMANENT PACEMAKER INSERTION;  Surgeon: [name], MD;  Location: [facility] CATH LAB;  Service: Cardiovascular;  Laterality: N/A;  . REPLACEMENT TOTAL KNEE      Home Medications:  Allergies as of 06/10/2017      Reactions   Celecoxib    unknown      Medication List        Accurate as of 06/10/17  8:48 AM.  Always use your most recent med list.          amLODipine 5 MG tablet Commonly known as:  NORVASC Take 1 tablet (5 mg total) by mouth daily.   aspirin EC 81 MG tablet Take 81 mg by mouth daily.   Co Q 10 100 MG Caps Take 100 mg by mouth daily.   CURCUMIN 95 PO Take 1 tablet by mouth daily.   leflunomide 10 MG tablet Commonly known as:  ARAVA Take 1 tablet by mouth daily.   lisinopril 10 MG tablet Commonly known as:  PRINIVIL,ZESTRIL Take 1 tablet (10 mg total) by mouth daily.   magnesium oxide 400 MG tablet Commonly known as:  MAG-OX Take 400 mg daily by mouth.   MAGNESIUM PO Take 1 tablet by mouth daily.   metFORMIN 500 MG tablet Commonly known as:  GLUCOPHAGE Take 500 mg by mouth 2 (two) times daily.   mirabegron ER 50 MG Tb24 tablet Commonly known as:  MYRBETRIQ Take 1 tablet (50 mg total) by mouth daily.   nebivolol 5 MG tablet Commonly known as:  BYSTOLIC Take 5 mg by mouth daily.   nitroGLYCERIN 0.4 MG SL tablet Commonly known as:  NITROSTAT DISSOLVE 1 TABLET UNDER THE TONGUE EVERY 5 MINUTES AS NEEDED FOR CHEST PAIN.   NONFORMULARY OR COMPOUNDED ITEM [facility]: Onychomycosis Nail Lacquer - Fluconazole 2%, Terbinafine 1%, DMSO,  apply 1-2 grams to affected area daily.   OMEGA 3 PO Take 1 tablet 2 (two) times daily by mouth.   thyroid 90 MG tablet Commonly known as:  ARMOUR Take 90 mg by mouth daily.   Turmeric Curcumin Caps Take 2 capsules daily by mouth.       Allergies:  Allergies  Allergen Reactions  . Celecoxib     unknown     Family History: Family History  Problem Relation Age of Onset  . Pancreatic cancer Mother   . Bone cancer Father   . Alzheimer's disease Sister   . Heart attack Paternal Uncle   . Arthritis Maternal Grandfather   . Alzheimer's disease Paternal Grandmother   . Lung cancer Paternal Grandfather     Social History:  reports that he has quit smoking. He has quit using smokeless tobacco. He reports  that he drinks alcohol. He reports that he does not use drugs.  ROS: UROLOGY Frequent Urination?: Yes Hard to postpone urination?: Yes Burning/pain with urination?: No Get up at night to urinate?: No Leakage of urine?: Yes Urine stream starts and stops?: No Trouble starting stream?: No Do you have to strain to urinate?: No Blood in urine?: No Urinary tract infection?: No Sexually transmitted disease?: No Injury to kidneys or bladder?: No Painful intercourse?: No Weak stream?: No Erection problems?: No Penile pain?: No  Gastrointestinal Nausea?: No Vomiting?: No Indigestion/heartburn?: No Diarrhea?: No Constipation?: No  Constitutional Fever: No Night sweats?: No Weight loss?: No Fatigue?: Yes  Skin Skin rash/lesions?: No Itching?: No  Eyes Blurred vision?: No Double vision?: No  Ears/Nose/Throat Sore throat?: No Sinus problems?: No  Hematologic/Lymphatic Swollen glands?: No Easy bruising?: No  Cardiovascular Leg swelling?: Yes Chest pain?: No  Respiratory Cough?: Yes Shortness of breath?: Yes  Endocrine Excessive thirst?: No  Musculoskeletal Back pain?: No Joint pain?: No  Neurological Headaches?: No Dizziness?: No  Psychologic Depression?: No Anxiety?: No  Physical Exam: BP (!) 176/82   Pulse 86   Resp 14   Ht _0  (1.702 m)   Wt 192 lb (87.1 kg)   BMI 30.07 kg/m   Constitutional:  Alert and oriented, No acute distress. HEENT: [place] AT, moist mucus membranes.  Trachea midline, no masses. Cardiovascular: No clubbing, cyanosis, or edema. Respiratory: Normal respiratory effort, no increased work of breathing. GI: Abdomen is soft, nontender, nondistended, no abdominal masses GU: No CVA tenderness. Skin: No rashes, bruises or suspicious lesions. Lymph: No cervical or inguinal adenopathy. Neurologic: Grossly intact, no focal deficits, moving all 4 extremities. Psychiatric: Normal mood and affect.  Laboratory Data: Lab Results    Component Value Date   WBC 6.7 04/02/2017   HGB 13.7 04/02/2017   HCT 40.5 04/02/2017   MCV 92 04/02/2017   PLT 209 04/02/2017    Lab Results  Component Value Date   CREATININE 1.00 05/15/2017      Assessment & Plan:   77 year old male with intermittent urinary incontinence.  His incontinence is difficult to characterize.  He still has urge with urge incontinence but does have unaware incontinence.  We discussed the possibility of sphincteric incontinence after TURP and that I would recommend a urodynamic study.  He did have this preoperatively and does not desire to proceed.  He was informed that Myrbetriq is not indicated at 50 mg twice daily.  He will go back to 50 mg once daily and will add Vesicare as this has been FDA approved.  PVR by bladder scan today was 63 mL.  -  Bladder Scan (Post Void Residual) in office   Return in about 4 weeks (around 07/08/2017) for Recheck.  Abbie Sons, [place] [address] [place], [place] [address] [phone]

## 2017-06-11 ENCOUNTER — Other Ambulatory Visit: Payer: Self-pay

## 2017-06-11 MED ORDER — SOLIFENACIN SUCCINATE 10 MG PO TABS: 10.0000 mg | ORAL_TABLET | Freq: Every day | ORAL | 2 refills | Status: DC

## 2017-06-16 ENCOUNTER — Ambulatory Visit (HOSPITAL_BASED_OUTPATIENT_CLINIC_OR_DEPARTMENT_OTHER): Payer: Medicare Other | Attending: Nurse Practitioner | Admitting: Cardiology

## 2017-06-16 ENCOUNTER — Encounter (INDEPENDENT_AMBULATORY_CARE_PROVIDER_SITE_OTHER): Payer: Self-pay

## 2017-06-16 VITALS — Ht 66.0 in | Wt 185.0 lb

## 2017-06-16 DIAGNOSIS — R400 Somnolence: Secondary | ICD-10-CM

## 2017-06-16 DIAGNOSIS — G4733 Obstructive sleep apnea (adult) (pediatric): Secondary | ICD-10-CM | POA: Insufficient documentation

## 2017-06-16 DIAGNOSIS — G4730 Sleep apnea, unspecified: Secondary | ICD-10-CM | POA: Diagnosis not present

## 2017-06-16 DIAGNOSIS — R0602 Shortness of breath: Secondary | ICD-10-CM

## 2017-06-16 DIAGNOSIS — R0681 Apnea, not elsewhere classified: Secondary | ICD-10-CM

## 2017-06-18 ENCOUNTER — Telehealth: Payer: Self-pay

## 2017-06-18 MED ORDER — SOLIFENACIN SUCCINATE 10 MG PO TABS: 10.0000 mg | ORAL_TABLET | Freq: Every day | ORAL | 2 refills | Status: DC

## 2017-06-18 NOTE — Telephone Encounter (Signed)
Pt called stating he is not able to afford vesicare. Pt stated that he knows he has tried the myrbetriq but does not want to pay the $500 if medication doesn't work. Please advise.

## 2017-06-18 NOTE — Procedures (Signed)
   [name] DATE OF BIRTH:  [birth year] MEDICAL RECORD NUMBER [id number]  LOCATION: [facility]  PHYSICIAN: Traci Turner  DATE OF STUDY: 06/16/2017  SLEEP STUDY TYPE: Nocturnal Polysomnogram               REFERRING PHYSICIAN: Patsey Berthold, NP   CLINICAL INFORMATION Sleep Study Type: NPSG  Indication for sleep study: Diabetes, Excessive Daytime Sleepiness, Hypertension  Epworth Sleepiness Score: 14  SLEEP STUDY TECHNIQUE As per the AASM Manual for the Scoring of Sleep and Associated Events v2.3 (April 2016) with a hypopnea requiring 4% desaturations.  The channels recorded and monitored were frontal, central and occipital EEG, electrooculogram (EOG), submentalis EMG (chin), nasal and oral airflow, thoracic and abdominal wall motion, anterior tibialis EMG, snore microphone, electrocardiogram, and pulse oximetry.  MEDICATIONS Medications self-administered by patient taken the night of the study : [place] The study was initiated at 10:30:56 PM and ended at 4:54:11 AM.  Sleep onset time was 5.6 minutes and the sleep efficiency was 87.3%. The total sleep time was 334.7 minutes.  Stage REM latency was 100.0 minutes.  The patient spent 15.00% of the night in stage N1 sleep, 73.35% in stage N2 sleep, 0.00% in stage N3 and 11.65% in REM.  Alpha intrusion was absent.  Supine sleep was 17.92%.  RESPIRATORY PARAMETERS The overall apnea/hypopnea index (AHI) was 8.1 per hour. There were 1 total apneas, including 1 obstructive, 0 central and 0 mixed apneas. There were 44 hypopneas and 51 RERAs.  The AHI during Stage REM sleep was 6.2 per hour.  AHI while supine was 20.0 per hour.  The mean oxygen saturation was 94.50%. The minimum SpO2 during sleep was 87.00%.  moderate snoring was noted during this study.  CARDIAC DATA The 2 lead EKG demonstrated NSR. The mean heart rate was 61.11 beats per minute. Other EKG findings  include: PVCs.  LEG MOVEMENT DATA The total PLMS were 41 with a resulting PLMS index of 7.35. Associated arousal with leg movement index was 0.9 .  IMPRESSIONS - Mild obstructive sleep apnea occurred during this study (AHI = 8.1/h). - No significant central sleep apnea occurred during this study (CAI = 0.0/h). - Mild oxygen desaturation was noted during this study (Min O2 = 87.00%). - The patient snored with moderate snoring volume. - PVCs were noted during this study. - Mild periodic limb movements of sleep occurred during the study. No significant associated arousals.  DIAGNOSIS - Obstructive Sleep Apnea (327.23 [G47.33 ICD-10])  RECOMMENDATIONS - Therapeutic CPAP titration to determine optimal pressure required to alleviate sleep disordered breathing. - Positional therapy avoiding supine position during sleep. - Avoid alcohol, sedatives and other CNS depressants that may worsen sleep apnea and disrupt normal sleep architecture. - Sleep hygiene should be reviewed to assess factors that may improve sleep quality. - Weight management and regular exercise should be initiated or continued if appropriate.  [place], American Board of Sleep Medicine  ELECTRONICALLY SIGNED ON:  06/18/2017, 11:49 PM [place] PH: [phone]   FX: [phone] [place]

## 2017-06-19 ENCOUNTER — Telehealth: Payer: Self-pay | Admitting: *Deleted

## 2017-06-19 DIAGNOSIS — R0602 Shortness of breath: Secondary | ICD-10-CM

## 2017-06-19 DIAGNOSIS — R942 Abnormal results of pulmonary function studies: Secondary | ICD-10-CM

## 2017-06-19 NOTE — Telephone Encounter (Signed)
(  attempted to reach patient on several occassions about this)  Informed patient of results and verbal understanding expressed. He understands someone will call him to arrange referral/OV w/ pulmonary.

## 2017-06-19 NOTE — Telephone Encounter (Signed)
-----  Message from Cameron Berthold, NP sent at 06/05/2017  3:18 PM EST ----- Please notify patient of PFT results. Please refer to pulmonary for further evaluation for shortness of breath and abnormal PFTs. Thanks!

## 2017-06-23 ENCOUNTER — Telehealth: Payer: Self-pay | Admitting: Nurse Practitioner

## 2017-06-23 NOTE — Telephone Encounter (Signed)
Patient calling,  Patient states that he already had a procedure done and the results for the procedure were abnormal and states that he was supposed to f/u for more testing. Patient calling for update

## 2017-06-23 NOTE — Telephone Encounter (Signed)
Pt has been referred to [facility] pulmonary.  I spoke with pt and told him pulmonary office should be calling him with appointment information.  Office number for [facility] pulmonary given to pt

## 2017-07-01 ENCOUNTER — Telehealth: Payer: Self-pay | Admitting: *Deleted

## 2017-07-01 NOTE — Telephone Encounter (Signed)
Informed patient of sleep study results and patient understanding was verbalized. Patient understands that he has sleep apnea and Dr Radford Pax recommends CPAP titration.  Patient understands his titration study will be done at [facility] sleep lab. Patient understands he will receive a sleep packet in a week or so. Patient understands to call if he does not receive the sleep packet in a timely manner. Patient agrees with treatment and thanked me for call

## 2017-07-01 NOTE — Telephone Encounter (Signed)
-----  Message from Sueanne Margarita, MD sent at 06/18/2017 11:51 PM EST ----- Please let patient know that they have sleep apnea and recommend CPAP titration. Please set up titration in the sleep lab.

## 2017-07-01 NOTE — Telephone Encounter (Signed)
lmtcb   (referring to sleep study results)

## 2017-07-01 NOTE — Telephone Encounter (Signed)
-----  Message from Patsey Berthold, NP sent at 06/29/2017  9:13 AM EST ----- Please notify patient of results. He has an appt with pulmonary 2/15.  If they do not address at that visit, he can see Dr Radford Pax to discuss further  Thank you!  ----- Message ----- From: Patsey Berthold, NP Sent: 06/18/2017  11:52 PM To: Patsey Berthold, NP

## 2017-07-02 NOTE — Telephone Encounter (Signed)
Informed patient of results and verbal understanding expressed. He will call office back, if not addressed w/ pulmonary, to establish w/ Radford Pax

## 2017-07-02 NOTE — Telephone Encounter (Signed)
Follow Up: ° ° ° °Returning your call from yesterday. °

## 2017-07-07 ENCOUNTER — Ambulatory Visit (INDEPENDENT_AMBULATORY_CARE_PROVIDER_SITE_OTHER): Payer: Medicare Other | Admitting: Urology

## 2017-07-07 ENCOUNTER — Encounter: Payer: Self-pay | Admitting: Urology

## 2017-07-07 VITALS — BP 172/82 | HR 69 | Ht 67.0 in | Wt 189.0 lb

## 2017-07-07 DIAGNOSIS — N3941 Urge incontinence: Secondary | ICD-10-CM | POA: Diagnosis not present

## 2017-07-07 NOTE — Progress Notes (Signed)
07/07/2017 2:01 PM   Cameron [name] 13-Sep-1940 [id number]  Referring provider: [name], MD [place] [facility] [place], [place] [address]  Chief Complaint  Patient presents with  . Follow-up    HPI: 77 year old male presents for follow-up of urinary incontinence.  He was having significant incontinence after TURP.  It appeared to be more urge related.  At his last visit and Rx for solifenacin was sent to add to [facility].  This was never approved by his insurance company so he never started because the medication was too expensive.  In the interim however he has noted significant improvement in his urgency and urge incontinence.  He states he now leaks on rare occasions and is no longer having to wear pads.  He is currently satisfied with his voiding pattern.   PMH: Past Medical History:  Diagnosis Date  . BPH (benign prostatic hypertrophy)   . CAD (coronary artery disease) 12/2015   Cath by Dr [name] reveals distal and small vessel CAD.  Medical therapy advised.  . 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 Diabetes mellitus without complication ([place])   . Hypertension   . Hypothyroidism   . Rheumatoid arthritis(714.0)     Surgical History: Past Surgical History:  Procedure Laterality Date  . BACK SURGERY    . CARDIAC CATHETERIZATION N/A 12/28/2015   Procedure: Left Heart Cath and Coronary Angiography;  Surgeon: Belva Crome, MD;  Location: [place] CV LAB;  Service: Cardiovascular;  Laterality: N/A;  . cataracts    . PACEMAKER INSERTION  12/09/11   SJM Accent DR RF implanted by DR Allred for complete heart block and syncope  . PERMANENT PACEMAKER INSERTION N/A 12/09/2011   Procedure: PERMANENT PACEMAKER INSERTION;  Surgeon: [name], MD;  Location: [facility] CATH LAB;  Service: Cardiovascular;  Laterality: N/A;  . REPLACEMENT TOTAL KNEE      Home Medications:  Allergies as of 07/07/2017      Reactions   Celecoxib    unknown        Medication List        Accurate as of 07/07/17  2:01 PM. Always use your most recent med list.          amLODipine 5 MG tablet Commonly known as:  NORVASC Take 1 tablet (5 mg total) by mouth daily.   aspirin EC 81 MG tablet Take 81 mg by mouth daily.   Co Q 10 100 MG Caps Take 100 mg by mouth daily.   CURCUMIN 95 PO Take 1 tablet by mouth daily.   leflunomide 10 MG tablet Commonly known as:  ARAVA Take 1 tablet by mouth daily.   lisinopril 10 MG tablet Commonly known as:  PRINIVIL,ZESTRIL Take 1 tablet (10 mg total) by mouth daily.   magnesium oxide 400 MG tablet Commonly known as:  MAG-OX Take 400 mg daily by mouth.   MAGNESIUM PO Take 1 tablet by mouth daily.   metFORMIN 500 MG tablet Commonly known as:  GLUCOPHAGE Take 500 mg by mouth 2 (two) times daily.   mirabegron ER 50 MG Tb24 tablet Commonly known as:  MYRBETRIQ Take 1 tablet (50 mg total) by mouth daily.   nebivolol 5 MG tablet Commonly known as:  BYSTOLIC Take 5 mg by mouth daily.   nitroGLYCERIN 0.4 MG SL tablet Commonly known as:  NITROSTAT DISSOLVE 1 TABLET UNDER THE TONGUE EVERY 5 MINUTES AS NEEDED FOR CHEST PAIN.   NONFORMULARY OR COMPOUNDED  [place]: Onychomycosis Nail Lacquer - Fluconazole 2%, Terbinafine 1%, DMSO, apply 1-2 grams to affected area daily.   OMEGA 3 PO Take 1 tablet 2 (two) times daily by mouth.   thyroid 90 MG tablet Commonly known as:  ARMOUR Take 90 mg by mouth daily.   Turmeric Curcumin Caps Take 2 capsules daily by mouth.       Allergies:  Allergies  Allergen Reactions  . Celecoxib     unknown     Family History: Family History  Problem Relation Age of Onset  . Pancreatic cancer Mother   . Bone cancer Father   . Alzheimer's disease Sister   . Heart attack Paternal Uncle   . Arthritis Maternal Grandfather   . Alzheimer's disease Paternal Grandmother   . Lung cancer Paternal Grandfather     Social History:  reports that he has  quit smoking. He has quit using smokeless tobacco. He reports that he drinks alcohol. He reports that he does not use drugs.  ROS: UROLOGY Frequent Urination?: No Hard to postpone urination?: Yes Burning/pain with urination?: No Get up at night to urinate?: Yes Leakage of urine?: Yes Urine stream starts and stops?: No Trouble starting stream?: No Do you have to strain to urinate?: No Blood in urine?: No Urinary tract infection?: No Sexually transmitted disease?: No Injury to kidneys or bladder?: No Painful intercourse?: No Weak stream?: No Erection problems?: No Penile pain?: No  Gastrointestinal Nausea?: No Vomiting?: No Indigestion/heartburn?: No Diarrhea?: No Constipation?: No  Constitutional Fever: No Night sweats?: No Weight loss?: No Fatigue?: No  Skin Skin rash/lesions?: No Itching?: No  Eyes Blurred vision?: No Double vision?: No  Ears/Nose/Throat Sore throat?: No Sinus problems?: No  Hematologic/Lymphatic Swollen glands?: No Easy bruising?: No  Cardiovascular Leg swelling?: Yes Chest pain?: No  Respiratory Cough?: No Shortness of breath?: No  Endocrine Excessive thirst?: No  Musculoskeletal Back pain?: No Joint pain?: No  Neurological Headaches?: No Dizziness?: No  Psychologic Depression?: No Anxiety?: No  Physical Exam: BP (!) 172/82   Pulse 69   Ht 5' 7" (1.702 m)   Wt 189 lb (85.7 kg)   BMI 29.60 kg/m   Constitutional:  Alert and oriented, No acute distress.  Laboratory Data: Lab Results  Component Value Date   WBC 6.7 04/02/2017   HGB 13.7 04/02/2017   HCT 40.5 04/02/2017   MCV 92 04/02/2017   PLT 209 04/02/2017    Lab Results  Component Value Date   CREATININE 1.00 05/15/2017    Assessment & Plan:   Significant improvement in his urge incontinence on Myrbetriq alone.  He will continue this medication. Follow-up 6 months  Return in about 6 months (around 01/04/2018) for Recheck.   Abbie Sons,  [place] [address] [place], [place] [address] [phone]

## 2017-07-10 ENCOUNTER — Ambulatory Visit (INDEPENDENT_AMBULATORY_CARE_PROVIDER_SITE_OTHER): Payer: Medicare Other | Admitting: Internal Medicine

## 2017-07-10 ENCOUNTER — Encounter: Payer: Self-pay | Admitting: Internal Medicine

## 2017-07-10 VITALS — BP 142/72 | HR 60 | Ht 67.0 in | Wt 190.6 lb

## 2017-07-10 DIAGNOSIS — R0602 Shortness of breath: Secondary | ICD-10-CM

## 2017-07-10 DIAGNOSIS — R0989 Other specified symptoms and signs involving the circulatory and respiratory systems: Secondary | ICD-10-CM | POA: Diagnosis not present

## 2017-07-10 DIAGNOSIS — Z8739 Personal history of other diseases of the musculoskeletal system and connective tissue: Secondary | ICD-10-CM | POA: Diagnosis not present

## 2017-07-10 DIAGNOSIS — R0609 Other forms of dyspnea: Secondary | ICD-10-CM | POA: Diagnosis not present

## 2017-07-10 NOTE — Patient Instructions (Addendum)
ICD-10-CM   1. Dyspnea on exertion R06.09   2. Bibasilar crackles R09.89   3. 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

## 2017-07-10 NOTE — Progress Notes (Signed)
Subjective:    [name], male    DOB: Mar 17, 1941, 77 y.o.   MRN: [id number]   PCP [name], MD  HPI   IOV 07/10/2017  Chief Complaint  Patient presents with  . Advice Only    Referred by [facility] due to SOB.  PFT done 05/28/17.  Pt has been having issues with SOB x4 months especially with exertion and has some mild chest tightness. Denies any cough.    77 year old male referred by [name] for evaluation of shortness of breath after cardiac etiologies ruled out.  He tells me that he is a remote smoker.  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  He has a long-standing history of rheumatoid arthritis followed by Dr. Jefm Bryant in [place].  He is to be on methotrexate for many years and stopped taking it because of cardiac dysfunction [he personally is convinced that methotrexate because this].  He was then on leflunomide as of 2017 but is currently not on it.  His last rheumatoid factor and CCP antibodies were negative on my personal chart review of the outside records in 2016.  Now for the last 3 or 6 months he is got insidious onset of shortness of breath that is slowly progressive.  His dyspnea on exertion relieved by rest.  Class II-3 activities.  There is no associated cough or orthopnea proximal nocturnal dyspnea.  He did have some edema but this got cleared up but the dyspnea is continuing to get worse.  In the last few months he has had a cardiac echo that showed pulmonary hypertension.  Did have cardiac stress test that is normal.  Had pulmonary function test that showed isolated reduction in diffusion capacity and therefore he has been referred here.  Walking desaturation test on 07/10/2017 185 feet x 3 laps on ROOM AIR:  did NOT desaturate. Rest pulse ox was 100%, final pulse ox was 98%. HR response 60/min at rest to 121/min at peak exertion.  Patient Cameron [name]  Did not Desaturate < 88% . Cameron [name] did not  Desaturated </= 3% points. Cameron [name] yes did get tachyardic  Results for BROEDY, OSBOURNE (MRN [id number]) as of 07/10/2017 12:01  Ref. Range 05/28/2017 13:28  DLCO unc Latest Units: ml/min/mmHg 20.35  DLCO unc % pred Latest Units: % 71   Results for KAEVON, COTTA (MRN [id number]) as of 07/10/2017 12:01  Ref. Range 05/28/2017 13:28  FVC-Pre Latest Units: L 3.29  FVC-%Pred-Pre Latest Units: % 90    has a past medical history of BPH (benign prostatic hypertrophy), CAD (coronary artery disease) (12/2015), Complete heart block ([place]), Diabetes mellitus without complication ([place]), Hypertension, Hypothyroidism, and Rheumatoid arthritis(714.0).   reports that he quit smoking about 45 years ago. His smoking use included cigarettes. He has a 5.00 pack-year smoking history. He has quit using smokeless tobacco.  Past Surgical History:  Procedure Laterality Date  . BACK SURGERY    . CARDIAC CATHETERIZATION N/A 12/28/2015   Procedure: Left Heart Cath and Coronary Angiography;  Surgeon: Belva Crome, MD;  Location: [place] [facility] LAB;  Service: Cardiovascular;  Laterality: N/A;  . cataracts    . PACEMAKER INSERTION  12/09/11   SJM Accent DR RF implanted by DR Allred for complete heart block and syncope  . PERMANENT PACEMAKER INSERTION N/A  12/09/2011   Procedure: PERMANENT PACEMAKER INSERTION;  Surgeon: [name], MD;  Location: [facility] CATH LAB;  Service: Cardiovascular;  Laterality: N/A;  . REPLACEMENT TOTAL KNEE      Allergies  Allergen Reactions  . Celecoxib     unknown     Immunization History  Administered Date(s) Administered  . Influenza, High Dose Seasonal PF 03/26/2017  . Influenza-Unspecified 03/26/2016  . Pneumococcal Polysaccharide-23 12/10/2011  . Pneumococcal-Unspecified 03/26/2016    Family History  Problem Relation Age of Onset  . Pancreatic cancer Mother   . Bone cancer Father   . Alzheimer's  disease Sister   . Heart attack Paternal Uncle   . Arthritis Maternal Grandfather   . Alzheimer's disease Paternal Grandmother   . Lung cancer Paternal Grandfather      Current Outpatient Medications:  .  aspirin EC 81 MG tablet, Take 81 mg by mouth daily., Disp: , Rfl:  .  BLACK PEPPER-TURMERIC PO, Take 1 capsule by mouth 2 (two) times daily., Disp: , Rfl:  .  Coenzyme Q10 (CO Q 10) 100 MG CAPS, Take 100 mg by mouth daily., Disp: , Rfl:  .  leflunomide (ARAVA) 10 MG tablet, Take 1 tablet by mouth daily., Disp: , Rfl:  .  lisinopril (PRINIVIL,ZESTRIL) 10 MG tablet, Take 1 tablet (10 mg total) by mouth daily., Disp: 90 tablet, Rfl: 3 .  metFORMIN (GLUCOPHAGE) 500 MG tablet, Take 500 mg by mouth 2 (two) times daily., Disp: , Rfl:  .  mirabegron ER (MYRBETRIQ) 50 MG TB24 tablet, Take 1 tablet (50 mg total) by mouth daily., Disp: 30 tablet, Rfl: 11 .  nebivolol (BYSTOLIC) 5 MG tablet, Take 5 mg by mouth daily., Disp: , Rfl:  .  Omega-3 Fatty Acids (OMEGA 3 PO), Take 1 tablet 2 (two) times daily by mouth., Disp: , Rfl:  .  thyroid (ARMOUR) 90 MG tablet, Take 90 mg by mouth daily., Disp: , Rfl:  .  Turmeric (CURCUMIN 95 PO), Take 1 tablet by mouth daily., Disp: , Rfl:  .  nitroGLYCERIN (NITROSTAT) 0.4 MG SL tablet, DISSOLVE 1 TABLET UNDER THE TONGUE EVERY 5 MINUTES AS NEEDED FOR CHEST PAIN. (Patient not taking: Reported on 07/10/2017), Disp: 75 tablet, Rfl: 1   Review of Systems  Constitutional: Negative for fever and unexpected weight change.  HENT: Positive for congestion, sinus pressure, sneezing, sore throat and trouble swallowing. Negative for dental problem, ear pain, nosebleeds, postnasal drip and rhinorrhea.   Eyes: Negative for redness and itching.  Respiratory: Positive for chest tightness and shortness of breath. Negative for cough and wheezing.   Cardiovascular: Positive for leg swelling. Negative for palpitations.  Gastrointestinal: Negative for nausea and vomiting.    Genitourinary: Positive for dysuria.  Musculoskeletal: Negative for joint swelling.  Skin: Positive for rash.  Allergic/Immunologic: Negative.  Negative for environmental allergies, food allergies and immunocompromised state.  Neurological: Negative for headaches.  Hematological: Does not bruise/bleed easily.  Psychiatric/Behavioral: Negative for dysphoric mood. The patient is not nervous/anxious.        Objective:   Physical Exam  Constitutional: He is oriented to person, place, and time. He appears well-developed and well-nourished. No distress.  HENT:  Head: Normocephalic and atraumatic.  Right Ear: External ear normal.  Left Ear: External ear normal.  Mouth/Throat: Oropharynx is clear and moist. No oropharyngeal exudate.  Eyes: Conjunctivae and EOM are normal. Pupils are equal, round, and reactive to light. Right eye exhibits no discharge. Left eye exhibits no discharge. No scleral icterus.  Neck:  Normal range of motion. Neck supple. No JVD present. No tracheal deviation present. No thyromegaly present.  Cardiovascular: Normal rate, regular rhythm and intact distal pulses. Exam reveals no gallop and no friction rub.  No murmur heard. Pulmonary/Chest: Effort normal. No respiratory distress. He has no wheezes. He has rales. He exhibits no tenderness.  R >L basal crackles  Abdominal: Soft. Bowel sounds are normal. He exhibits no distension and no mass. There is no tenderness. There is no rebound and no guarding.  Musculoskeletal: Normal range of motion. He exhibits no edema or tenderness.  Lymphadenopathy:    He has no cervical adenopathy.  Neurological: He is alert and oriented to person, place, and time. He has normal reflexes. No cranial nerve deficit. Coordination normal.  Skin: Skin is warm and dry. No rash noted. He is not diaphoretic. No erythema. No pallor.  Psychiatric: He has a normal mood and affect. His behavior is normal. Judgment and thought content normal.  Nursing  note and vitals reviewed.   Vitals:   07/10/17 1145  BP: (!) 142/72  Pulse: 60  SpO2: 100%  Weight: 190 lb 9.6 oz (86.5 kg)  Height: _0  (1.702 m)    Estimated body mass index is 29.85 kg/m as calculated from the following:   Height as of this encounter: _1  (1.702 m).   Weight as of this encounter: 190 lb 9.6 oz (86.5 kg).       Assessment & Plan:     ICD-10-CM   1. Dyspnea on exertion R06.09   2. Bibasilar crackles R09.89   3. 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 Brand Males, M.D., [facility].C.P Pulmonary and Critical Care Medicine Staff Physician, [place] Director - Interstitial Lung Disease  Program  Pulmonary [place] [facility] [place], [place], [address]  Pager: [phone], If no answer or between  15:00h - 7:00h: call [phone] Telephone: [phone]

## 2017-07-13 ENCOUNTER — Telehealth: Payer: Self-pay | Admitting: *Deleted

## 2017-07-13 ENCOUNTER — Encounter: Payer: Self-pay | Admitting: *Deleted

## 2017-07-13 DIAGNOSIS — G4733 Obstructive sleep apnea (adult) (pediatric): Secondary | ICD-10-CM

## 2017-07-13 NOTE — Telephone Encounter (Signed)
-----  Message from Almyra Free sent at 07/08/2017  1:25 PM EST ----- Regarding: RE: pre cert  Pt can be scheduled. Let me know when and I will enter precert info. Thanks, Amy ----- Message ----- From: Freada Bergeron, CMA Sent: 07/02/2017   5:59 PM To: [facility] Studies Subject: pre cert                                       CPAP titration

## 2017-07-13 NOTE — Addendum Note (Signed)
Addended by: Freada Bergeron on: 07/13/2017 04:51 PM   Modules accepted: Level of Service, SmartSet

## 2017-07-13 NOTE — Telephone Encounter (Signed)
  This encounter was created in error - please disregard.

## 2017-07-22 ENCOUNTER — Ambulatory Visit (INDEPENDENT_AMBULATORY_CARE_PROVIDER_SITE_OTHER)
Admission: RE | Admit: 2017-07-22 | Discharge: 2017-07-22 | Disposition: A | Discharge status: Home / Self Care | Payer: Medicare Other | Source: Ambulatory Visit | Attending: Internal Medicine | Admitting: Internal Medicine

## 2017-07-22 ENCOUNTER — Ambulatory Visit (HOSPITAL_BASED_OUTPATIENT_CLINIC_OR_DEPARTMENT_OTHER): Payer: Medicare Other | Attending: Cardiology | Admitting: Cardiology

## 2017-07-22 DIAGNOSIS — R0602 Shortness of breath: Secondary | ICD-10-CM | POA: Diagnosis not present

## 2017-07-22 DIAGNOSIS — G4733 Obstructive sleep apnea (adult) (pediatric): Secondary | ICD-10-CM | POA: Insufficient documentation

## 2017-07-23 ENCOUNTER — Telehealth: Payer: Self-pay | Admitting: *Deleted

## 2017-07-23 NOTE — Telephone Encounter (Signed)
-----  Message from Sueanne Margarita, MD sent at 07/23/2017  3:55 PM EST ----- Please let patient know that they had a successful PAP titration and let DME know that orders are in EPIC.  Please set up 10 week OV with me.

## 2017-07-23 NOTE — Procedures (Signed)
[name] DATE OF BIRTH:  1940/09/11 MEDICAL RECORD NUMBER [id number]  LOCATION: [facility]  PHYSICIAN: Traci Turner  DATE OF STUDY: 07/22/2017  SLEEP STUDY TYPE: Positive Airway Pressure Titration               REFERRING PHYSICIAN: Sueanne Margarita, MD   Gender: Male  D.O.B: January 04, 1941  Age (years): 64  Referring Provider: Fransico Him MD, ABSM  Height (inches): 66  Interpreting Physician: Fransico Him MD, ABSM  Weight (lbs): 185  RPSGT: Zadie Rhine  BMI: 30  MRN: [id number]  Neck Size: 16.00  <br> <br>  CLINICAL INFORMATION  The patient is referred for a CPAP titration to treat sleep apnea SLEEP STUDY TECHNIQUE  As per the AASM Manual for the Scoring of Sleep and Associated Events v2.3 (April 2016) with a hypopnea requiring 4% desaturations. The channels recorded and monitored were frontal, central and occipital EEG, electrooculogram (EOG), submentalis EMG (chin), nasal and oral airflow, thoracic and abdominal wall motion, anterior tibialis EMG, snore microphone, electrocardiogram, and pulse oximetry. Continuous positive airway pressure (CPAP) was initiated at the beginning of the study and titrated to treat sleep-disordered breathing. MEDICATIONS  Medications self-administered by patient taken the night of the study : [place]  Comments added by technician: NO RESTROOM VISTED. Patient had difficulty initiating sleep. Comments added by scorer: N/A  RESPIRATORY PARAMETERS  Optimal PAP Pressure (cm): 7 AHI at Optimal Pressure (/hr): 1.1  Overall Minimal O2 (%): 88.0 Supine % at Optimal Pressure (%): 76  Minimal O2 at Optimal Pressure (%): 89.0      SLEEP ARCHITECTURE  The study was initiated at 9:57:32 PM and ended at 4:31:45 AM. Sleep onset time was 2.8 minutes and the sleep efficiency was 96.4%%. The total sleep time was 380.0 minutes. The patient spent 2.8%% of the night in stage N1 sleep, 80.0%% in stage N2 sleep,  0.0%% in stage N3 and 17.24% in REM.Stage REM latency was 7.5 minutes Wake after sleep onset was 11.4. Alpha intrusion was absent. Supine sleep was 40.65%. CARDIAC DATA  The 2 lead EKG demonstrated AV paced rhythm. The mean heart rate was 61.5 beats per minute.  LEG MOVEMENT DATA  The total Periodic Limb Movements of Sleep (PLMS) were 0. The PLMS index was 0.0. A PLMS index of <15 is considered normal in adults. IMPRESSIONS  - The optimal PAP pressure was 7 cm of water. - Central sleep apnea was not noted during this titration (CAI = 0.0/h).  - Mild oxygen desaturations were observed during this titration (min O2 = 88.0%).  - No snoring was audible during this study.  - 2-lead EKG demonstrated: AV paced rhythm - Clinically significant periodic limb movements were not noted during this study. Arousals associated with PLMs were rare. DIAGNOSIS  - Obstructive Sleep Apnea (327.23 [G47.33 ICD-10]) RECOMMENDATIONS  - Trial of CPAP therapy on 7 cm H2O with a Medium size Fisher&Paykel Full Face Mask Simplus mask and heated humidification. - Avoid alcohol, sedatives and other CNS depressants that may worsen sleep apnea and disrupt normal sleep architecture.  - Sleep hygiene should be reviewed to assess factors that may improve sleep quality.  - Weight management and regular exercise should be initiated or continued.  - Return to Sleep Center for re-evaluation after 10 weeks of therapy   [place], [place] [facility]  ELECTRONICALLY SIGNED ON:  07/23/2017, 3:53 PM [place] PH: [phone]   FX: [phone] ACCREDITED BY THE AMERICAN ACADEMY OF SLEEP MEDICINE

## 2017-07-23 NOTE — Telephone Encounter (Signed)
Informed patient of sleep study results and patient understanding was verbalized. Patient understands Patient understands he had a successful PAP titration and Dr Radford Pax has ordered him a CPAP. Patient understands he will be contacted by [facility] to set up his cpap. He understands to call if [facility] does not contact him with new setup in a timely manner. He understands he will be called once confirmation has been received from [facility] that he has received his new machine to schedule 10 week follow up appointment.  [facility] notified of new cpap order  Please add to Cameron [name] He was grateful for the call and thanked me.

## 2017-07-26 ENCOUNTER — Telehealth: Payer: Self-pay | Admitting: Internal Medicine

## 2017-07-26 DIAGNOSIS — J849 Interstitial pulmonary disease, unspecified: Secondary | ICD-10-CM

## 2017-07-26 NOTE — Telephone Encounter (Signed)
CT shows ild  Plan - atleast 3d befor followup pleae have him do   Serum: ESR, ACE, ANA, DS-DNA, RF, anti-CCP, ssA, ssB, scl-70, ANCA screen, MPO, PR-3, Total CK,  RNP, Aldolase,  Hypersensitivity Pneumonitis Panel

## 2017-07-27 ENCOUNTER — Encounter: Admission: RE | Payer: Self-pay | Source: Ambulatory Visit

## 2017-07-27 ENCOUNTER — Ambulatory Visit: Admission: RE | Admit: 2017-07-27 | Payer: Medicare Other | Source: Ambulatory Visit | Admitting: Gastroenterology

## 2017-07-27 SURGERY — COLONOSCOPY WITH PROPOFOL
Anesthesia: General

## 2017-07-29 ENCOUNTER — Other Ambulatory Visit: Payer: Self-pay | Admitting: *Deleted

## 2017-07-29 DIAGNOSIS — J849 Interstitial pulmonary disease, unspecified: Secondary | ICD-10-CM

## 2017-07-29 NOTE — Telephone Encounter (Signed)
Called and spoke with pt letting him know the dx of ild based on the HRCT.  Pt expressed understanding. Stated to pt we needed him to come either tomorrow, Thursday, 07/30/17 or Friday, 07/31/17 to have labwork done before his upcoming appt with MR on 08/04/17 at 4pm.  Pt expressed understanding. Lab orders placed.  Nothing further needed at this current time.

## 2017-07-29 NOTE — Telephone Encounter (Signed)
lmtcb x1 for pt.  Labs are pending until speaking with pt

## 2017-07-29 NOTE — Telephone Encounter (Signed)
Pt is calling back [phone]

## 2017-07-30 ENCOUNTER — Other Ambulatory Visit (INDEPENDENT_AMBULATORY_CARE_PROVIDER_SITE_OTHER): Payer: Medicare Other

## 2017-07-30 ENCOUNTER — Telehealth: Payer: Self-pay | Admitting: Internal Medicine

## 2017-07-30 DIAGNOSIS — J849 Interstitial pulmonary disease, unspecified: Secondary | ICD-10-CM

## 2017-07-30 LAB — CARDIAC PANEL
CK-MB: 2.4 ng/mL (ref 0.3–4.0)
Relative Index: 5.3 calc — ABNORMAL HIGH (ref 0.0–2.5)
Total CK: 45 U/L (ref 7–232)

## 2017-07-30 NOTE — Telephone Encounter (Deleted)
Shaili  I left a missed call on your cell. Wondring if a more ILD beneficial medicaiton like immuran would be good for her. But given fact this might also be smoking related ILD I told her to quit smoking first and repeat scan some 3 months after quitting smoking. So for now methotrexate could be just fine but either if is she is unable to quit or ILD does not go away with quitting smoking then we can conisder immuran  Thanks  Dr. Brand Males, M.D., [facility].C.P Pulmonary and Critical Care Medicine Staff Physician, [place] Director - Interstitial Lung Disease  Program  Pulmonary [place] at [place], [place], [address]  Pager: [phone], If no answer or between  15:00h - 7:00h: call [phone] Telephone: [phone]

## 2017-07-31 LAB — ANTIPROTEINASE 3 (PR-3) ABS: ANCA Proteinase 3: <3.5 U/mL (ref 0.0–3.5)

## 2017-07-31 LAB — ANGIOTENSIN CONVERTING ENZYME: Angiotensin-Converting Enzyme: 6 U/L — ABNORMAL LOW (ref 9–67)

## 2017-07-31 LAB — CYCLIC CITRUL PEPTIDE ANTIBODY, IGG: Cyclic Citrullin Peptide Ab: <16 UNITS

## 2017-07-31 LAB — ANCA SCREEN W REFLEX TITER: ANCA Screen: NEGATIVE

## 2017-07-31 LAB — SPECIMEN STATUS REPORT

## 2017-07-31 LAB — RNP ANTIBODY: Ribonucleic Protein(ENA) Antibody, IgG: <1 AI

## 2017-07-31 NOTE — Telephone Encounter (Signed)
Sorry wrong patient

## 2017-07-31 NOTE — Telephone Encounter (Signed)
Please review the chart.  I have not seen this patient previously.

## 2017-08-02 IMAGING — CR DG ABDOMEN 1V
2 series · 2 of 2 positions shown · non-contrast
Comparison: CT, 09/09/2016

CLINICAL DATA: Admitted 09-09-16 with acute
gastroenteritis-secondary to rotavirus, acute appendicitis, and
renal failure; pt states he also began having lower abdominal pain

EXAM:
ABDOMEN - 1 VIEW

[abdomen kub (1 of 2)]
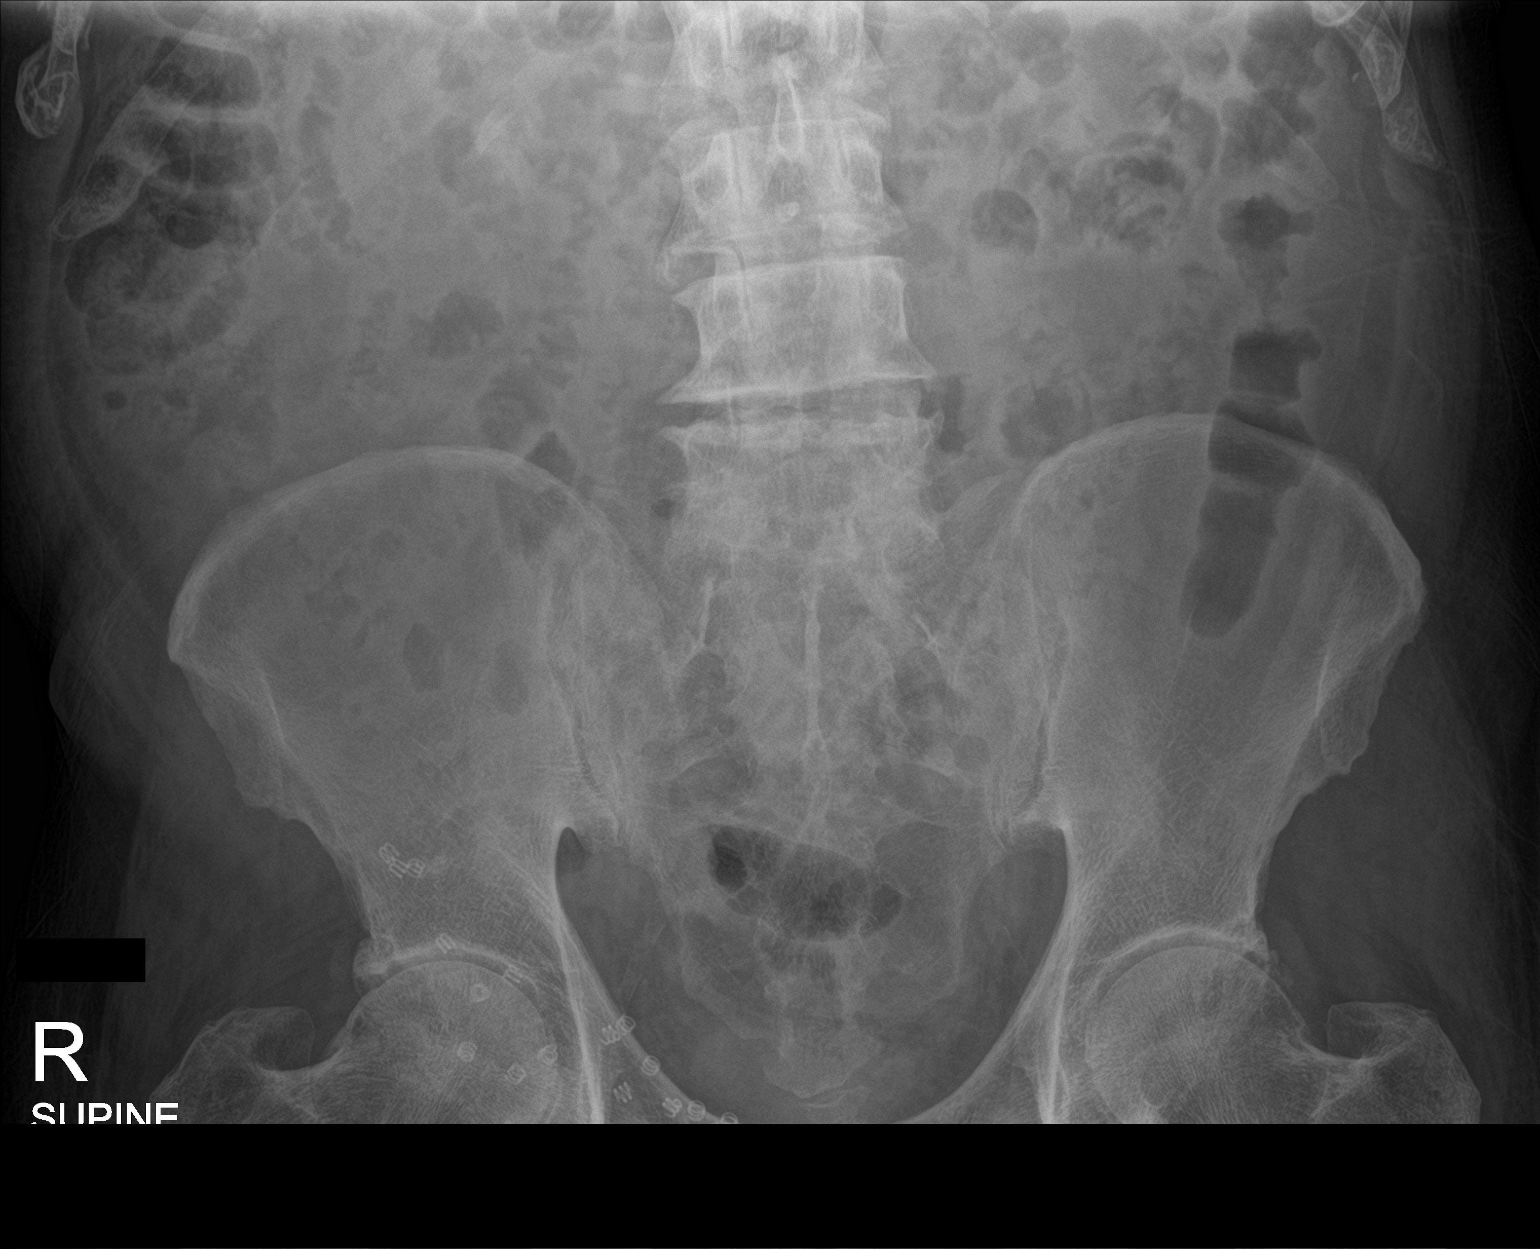

[abdomen kub (2 of 2)]
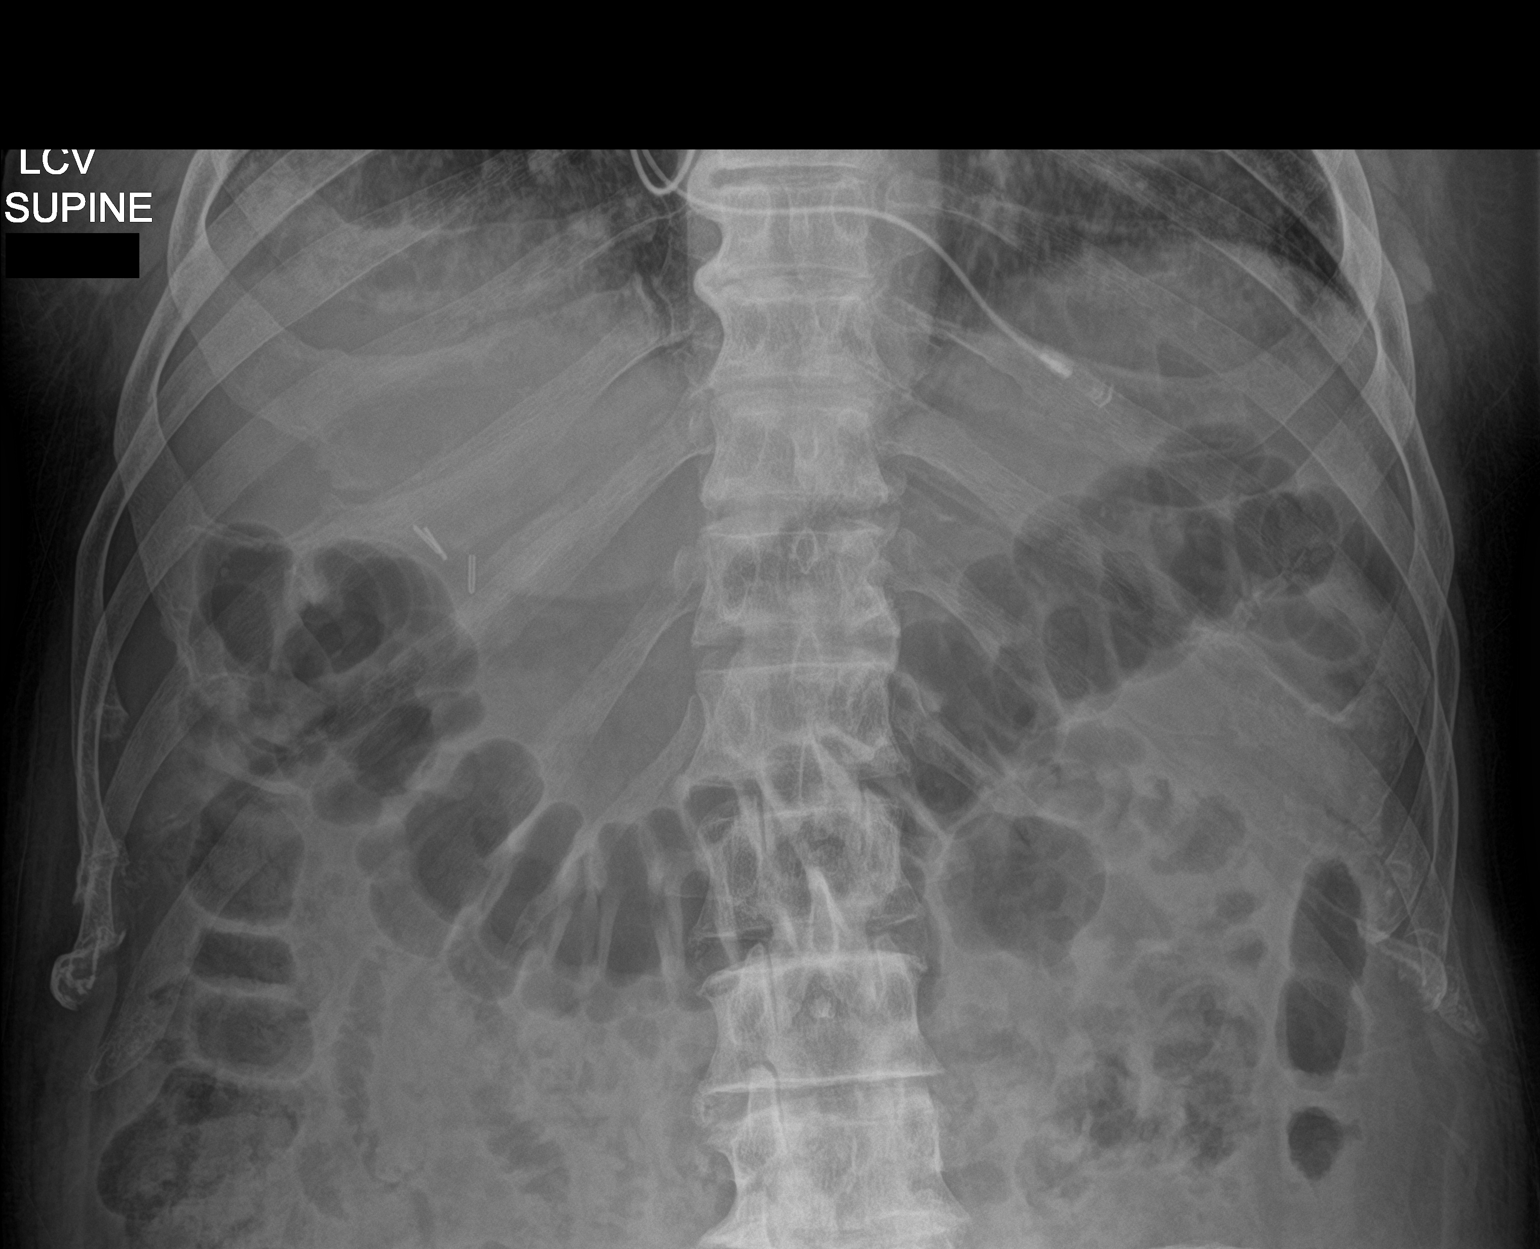

[2 of 2 positions shown; findings below may reference images not displayed]

FINDINGS: There is no bowel dilation to suggest obstruction. Tiny intrarenal
stones noted on CT are not visualized radiographically. No evidence
of a ureteral stone.

Right inguinal herniorrhaphy mesh is noted, stable. Soft tissues are
otherwise unremarkable.

No acute bony abnormality.
IMPRESSION: 1. No acute findings.  No evidence of bowel obstruction.

## 2017-08-03 LAB — HYPERSENSITIVITY PNEUMONITIS
A. Pullulans Abs: NEGATIVE
A.Fumigatus #1 Abs: NEGATIVE
Micropolyspora faeni, IgG: NEGATIVE
Pigeon Serum Abs: NEGATIVE
Thermoact. Saccharii: NEGATIVE
Thermoactinomyces vulgaris, IgG: NEGATIVE

## 2017-08-03 LAB — ALDOLASE: Aldolase: 3.3 U/L (ref 3.3–10.3)

## 2017-08-03 LAB — ANA: Anti Nuclear Antibody(ANA): NEGATIVE

## 2017-08-03 LAB — RHEUMATOID FACTOR: Rhuematoid fact SerPl-aCnc: <10 IU/mL (ref 0.0–13.9)

## 2017-08-03 LAB — SEDIMENTATION RATE: Sed Rate: 19 mm/hr (ref 0–30)

## 2017-08-03 LAB — ANTI-DNA ANTIBODY, DOUBLE-STRANDED: dsDNA Ab: 1 IU/mL (ref 0–9)

## 2017-08-03 LAB — SJOGREN'S SYNDROME ANTIBODS(SSA + SSB)
ENA SSA (RO) Ab: <0.2 AI (ref 0.0–0.9)
ENA SSB (LA) Ab: <0.2 AI (ref 0.0–0.9)

## 2017-08-03 LAB — ANTI-SCLERODERMA ANTIBODY: Scleroderma SCL-70: <0.2 AI (ref 0.0–0.9)

## 2017-08-03 LAB — ANTIMYELOPEROXIDASE (MPO) ABS: Myeloperoxidase Ab: <9 U/mL (ref 0.0–9.0)

## 2017-08-04 ENCOUNTER — Telehealth: Payer: Self-pay | Admitting: Internal Medicine

## 2017-08-04 ENCOUNTER — Telehealth: Payer: Self-pay | Admitting: *Deleted

## 2017-08-04 ENCOUNTER — Ambulatory Visit (INDEPENDENT_AMBULATORY_CARE_PROVIDER_SITE_OTHER): Payer: Medicare Other | Admitting: Internal Medicine

## 2017-08-04 ENCOUNTER — Encounter: Payer: Self-pay | Admitting: Internal Medicine

## 2017-08-04 VITALS — BP 126/74 | HR 92 | Ht 67.0 in | Wt 194.6 lb

## 2017-08-04 DIAGNOSIS — R0609 Other forms of dyspnea: Secondary | ICD-10-CM

## 2017-08-04 DIAGNOSIS — I2729 Other secondary pulmonary hypertension: Secondary | ICD-10-CM | POA: Diagnosis not present

## 2017-08-04 DIAGNOSIS — J849 Interstitial pulmonary disease, unspecified: Secondary | ICD-10-CM | POA: Diagnosis not present

## 2017-08-04 NOTE — Progress Notes (Signed)
Subjective:     [name], male   DOB: [birth year], 77 y.o.   MRN: [id number]  HPI   PCP Cameron Crouch, MD  HPI   IOV 07/10/2017  Chief Complaint  Patient presents with  . Advice Only    Referred by [facility] due to SOB.  PFT done 05/28/17.  Pt has been having issues with SOB x4 months especially with exertion and has some mild chest tightness. Denies any cough.    77 year old male referred by Dr. Rayann [name] for evaluation of shortness of breath after cardiac etiologies ruled out.  He tells me that he is a remote smoker.  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  He has a long-standing history of rheumatoid arthritis followed by Dr. Jefm [name] in [place].  He is to be on methotrexate for many years and stopped taking it because of cardiac dysfunction [he personally is convinced that methotrexate because this].  He was then on leflunomide as of 2017 but is currently not on it.  His last rheumatoid factor and CCP antibodies were negative on my personal chart review of the outside records in 2016.    Now for the last 3 or 6 months he is got insidious onset of shortness of breath that is slowly progressive.  His dyspnea on exertion relieved by rest.  Class II-3 activities.  There I  s no associated cough or orthopnea proximal nocturnal dyspnea.  He did have some edema but this got cleared up but the dyspnea is continuing to get worse.  In the last few months he has had a cardiac echo that showed pulmonary hypertension.  Did have cardiac stress test that is normal.  Had pulmonary function test that showed isolated reduction in diffusion capacity and therefore he has been referred here.  Walking desaturation test on 07/10/2017 185 feet x 3 laps on ROOM AIR:  did NOT desaturate. Rest pulse ox was 100%, final pulse ox was 98%. HR response 60/min at rest to 121/min at peak  exertion. Patient Cameron [name]  Did not Desaturate < 88% . Cameron [name] did not  Desaturated </= 3% points. Cameron [name] yes did get tachyardic   OV 08/04/2017  Chief Complaint  Patient presents with  . Follow-up    ILD    Follow-up interstitial lung disease workup  After the last visit no interim problems.  He has some chronic pedal edema that he will talk to about with his primary care physician.  He did see Dr. Jefm [name] rheumatologist in July 15, 2017.  I reviewed his notes.  It is not specifically indicate patient has nonspecific seronegative arthritis with a differential diagnosis of seronegative rheumatoid arthritis versus psoriatic.  Patient still seems to think the root of all his problems is the methotrexate he took for over 10 years.  At this point in time there is no decompensation.  As part of the ILD workup his pulmonary function test shows mild reduction in diffusion capacity.  Correlating with this is evidence of pulmonary hypertension on the echo and high-resolution CT chest enlarged pulmonary arteries.  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  His autoimmune panel is negative.  And so the vasculitis panel and hypersensitivity pneumonitis panel.   Results for Cameron [name]  A (MRN [id number]) as of 07/10/2017 12:01  Ref. Range 05/28/2017 13:28  DLCO unc Latest Units: ml/min/mmHg 20.35  DLCO unc % pred Latest Units: % 71   Results for Cameron [name] (MRN [id number]) as of 07/10/2017 12:01  Ref. Range 05/28/2017 13:28  FVC-Pre Latest Units: L 3.29  FVC-%Pred-Pre Latest Units: % 90    IMPRESSION: 1. 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 Early/mild usual interstitial pneumonitis is not excluded. 2. Aortic atherosclerosis (ICD10-170.0).  Coronary artery calcification. 3. Enlarged pulmonary arteries, indicative of pulmonary arterial Hypertension.- > in echo  Nov 2018: Pulmonary arteries: Systolic pressure was moderately increased.   PA peak pressure: 55 mm Hg (S). 4. Left renal stone, partially imaged.   Electronically Signed   By: Lorin Picket M.D.   On: 07/22/2017 15:03  Results for Cameron [name] (MRN [id number]) as of 08/04/2017 16:21  Ref. Range 07/30/2017 10:21  CK Total Latest Ref Range: 7 - 232 U/L 45  CK-MB Latest Ref Range: 0.3 - 4.0 ng/mL 2.4  Aldolase Latest Ref Range: 3.3 - 10.3 U/L 3.3  Sed Rate Latest Ref Range: 0 - 30 mm/hr 19  Anit Nuclear Antibody(ANA) Latest Ref Range: Negative  Negative  ANCA Proteinase 3 Latest Ref Range: 0.0 - 3.5 U/mL <3.5  Angiotensin-Converting Enzyme Latest Ref Range: 9 - 67 U/L 6 (L)  Cyclic Citrullin Peptide Ab Latest Units: UNITS <16  dsDNA Ab Latest Ref Range: 0 - 9 IU/mL 1  ENA SSA (RO) Ab Latest Ref Range: 0.0 - 0.9 AI <0.2  ENA SSB (LA) Ab Latest Ref Range: 0.0 - 0.9 AI <0.2  Myeloperoxidase Ab Latest Ref Range: 0.0 - 9.0 U/mL <9.0  RA Latex Turbid. Latest Ref Range: 0.0 - 13.9 IU/mL <10.0  Scleroderma SCL-70 Latest Ref Range: 0.0 - 0.9 AI <0.2  Ribonucleic Protein(ENA) Antibody, IgG Latest Ref Range: <1.0 NEG AI <1.0 NEG  A.Fumigatus #1 Abs Latest Ref Range: Negative  Negative  Micropolyspora faeni, IgG Latest Ref Range: Negative  Negative  Thermoactinomyces vulgaris, IgG Latest Ref Range: Negative  Negative  A. Pullulans Abs Latest Ref Range: Negative  Negative  Thermoact. Saccharii Latest Ref Range: Negative  Negative  [place] Serum Abs Latest Ref Range: Negative  Negative     has a past medical history of BPH (benign prostatic hypertrophy), CAD (coronary artery disease) (12/2015), Complete heart block ([place]), Diabetes mellitus without complication ([place]), Hypertension, Hypothyroidism, and Rheumatoid arthritis(714.0).   reports that he quit smoking about 45  years ago. His smoking use included cigarettes. He has a 5.00 pack-year smoking history. He has quit using smokeless tobacco.  Past Surgical History:  Procedure Laterality Date  . BACK SURGERY    . CARDIAC CATHETERIZATION N/A 12/28/2015   Procedure: Left Heart Cath and Coronary Angiography;  Surgeon: Belva Crome, MD;  Location: [facility];  Service: Cardiovascular;  Laterality: N/A;  . cataracts    . PACEMAKER INSERTION  12/09/11   SJM Accent DR RF implanted by DR Allred for complete heart block and syncope  . PERMANENT PACEMAKER INSERTION N/A 12/09/2011   Procedure: PERMANENT PACEMAKER INSERTION;  Surgeon: [name], MD;  Location: [facility] CATH LAB;  Service: Cardiovascular;  Laterality: N/A;  . REPLACEMENT TOTAL KNEE      Allergies  Allergen Reactions  . Celecoxib     unknown     Immunization History  Administered Date(s) Administered  .  Influenza, High Dose Seasonal PF 03/26/2017  . Influenza-Unspecified 03/26/2016  . Pneumococcal Polysaccharide-23 12/10/2011  . Pneumococcal-Unspecified 03/26/2016    Family History  Problem Relation Age of Onset  . Pancreatic cancer Mother   . Bone cancer Father   . Alzheimer's disease Sister   . Heart attack Paternal Uncle   . Arthritis Maternal Grandfather   . Alzheimer's disease Paternal Grandmother   . Lung cancer Paternal Grandfather      Current Outpatient Medications:  .  aspirin EC 81 MG tablet, Take 81 mg by mouth daily., Disp: , Rfl:  .  BLACK PEPPER-TURMERIC PO, Take 1 capsule by mouth 2 (two) times daily., Disp: , Rfl:  .  Coenzyme Q10 (CO Q 10) 100 MG CAPS, Take 100 mg by mouth daily., Disp: , Rfl:  .  leflunomide (ARAVA) 10 MG tablet, Take 1 tablet by mouth daily., Disp: , Rfl:  .  lisinopril (PRINIVIL,ZESTRIL) 10 MG tablet, Take 1 tablet (10 mg total) by mouth daily., Disp: 90 tablet, Rfl: 3 .  mirabegron ER (MYRBETRIQ) 50 MG TB24 tablet, Take 1 tablet (50 mg total) by mouth daily., Disp: 30 tablet, Rfl: 11 .   nebivolol (BYSTOLIC) 5 MG tablet, Take 5 mg by mouth daily., Disp: , Rfl:  .  nitroGLYCERIN (NITROSTAT) 0.4 MG SL tablet, DISSOLVE 1 TABLET UNDER THE TONGUE EVERY 5 MINUTES AS NEEDED FOR CHEST PAIN., Disp: 75 tablet, Rfl: 1 .  Omega-3 Fatty Acids (OMEGA 3 PO), Take 1 tablet 2 (two) times daily by mouth., Disp: , Rfl:  .  thyroid (ARMOUR) 90 MG tablet, Take 90 mg by mouth daily., Disp: , Rfl:  .  Turmeric (CURCUMIN 95 PO), Take 1 tablet by mouth daily., Disp: , Rfl:  .  metFORMIN (GLUCOPHAGE) 500 MG tablet, Take 500 mg by mouth 2 (two) times daily., Disp: , Rfl:    Review of Systems     Objective:   Physical Exam Vitals:   08/04/17 1621  BP: 126/74  Pulse: 92  SpO2: 97%  Weight: 194 lb 9.6 oz (88.3 kg)  Height: _0  (1.702 m)    Estimated body mass index is 30.48 kg/m as calculated from the following:   Height as of this encounter: _1  (1.702 m).   Weight as of this encounter: 194 lb 9.6 oz (88.3 kg).  Discussion only visit     Assessment:       ICD-10-CM   1. Dyspnea on exertion R06.09   2. 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  The CT scan pattern is indeterminate for UIP.  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  He has been exposed to fertilizer insecticide in [place] and would during his working life.  But the CT scan does not suggest hypersensitivity pneumonitis.  In addition is significant pulmonary hypertension on the echo and this could be the singular cause of dyspnea or in combination with the ILD findings.  I think at this point in time it is probably best to proceed with pulmonary hypertension workup and monitor his ILD closely with serial lung function and clinical follow-up.     Plan:       You likely have ILD but is mild The  ILD does not look on CT or blood  work like a Conservation officer, historic buildings if ILD is due to methotrexate that you took for 10 years YOu might have to come off [place] but at this point ok to continue - this is because your shortness of breath is more likely due to elevated pulmonary pressure seen on CT and echo  Plan  - discuss getting right heart cath with Dr Cameron [name] - strongly recommend this. I will message him - will continue to follow you closely - in 3 months do Pre-bd spiro and dlco only. No lung volume or bd response. No post-bd spiro  Followup 3 months or sooner in regular or ILD clinic    > 50% of this > 25 min visit spent in face to face counseling or coordination of care     Dr. Brand Males, M.D., [facility].C.P Pulmonary and Critical Care Medicine Staff Physician, [place] Director - Interstitial Lung Disease  Program  Pulmonary [place] at [place], [place], [address]  Pager: [phone], If no answer or between  15:00h - 7:00h: call [phone] Telephone: [phone]

## 2017-08-04 NOTE — Telephone Encounter (Signed)
Patient has a 10 week follow up appointment scheduled for October 26 2017. Patient understands he needs to keep this appointment for insurance compliance. Patient was grateful for the call and thanked me.

## 2017-08-04 NOTE — Telephone Encounter (Signed)
Hi Akshat  I think [name] needs  Right heart cath - PASP 50s on echo and also CT with large Pulm arteries. The ILD is not significant right nw  THanks  Dr. Brand Males, M.D., [facility].C.P Pulmonary and Critical Care Medicine Staff Physician, [place] Director - Interstitial Lung Disease  Program  Pulmonary [place] at [place], [place], [address]  Pager: [phone], If no answer or between  15:00h - 7:00h: call [phone] Telephone: [phone]

## 2017-08-04 NOTE — Patient Instructions (Addendum)
ICD-10-CM   1. Dyspnea on exertion R06.09   2. 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 I will message him - will continue to follow you closely - in 3 months do Pre-bd spiro and dlco only. No lung volume or bd response. No post-bd spiro  Followup 3 months or sooner in regular or ILD clinic

## 2017-08-05 ENCOUNTER — Encounter: Payer: Self-pay | Admitting: Internal Medicine

## 2017-08-05 ENCOUNTER — Ambulatory Visit (INDEPENDENT_AMBULATORY_CARE_PROVIDER_SITE_OTHER): Payer: Medicare Other | Admitting: Internal Medicine

## 2017-08-05 VITALS — BP 142/62 | HR 60 | Ht 67.0 in | Wt 190.0 lb

## 2017-08-05 DIAGNOSIS — M79604 Pain in right leg: Secondary | ICD-10-CM | POA: Diagnosis not present

## 2017-08-05 DIAGNOSIS — Z01812 Encounter for preprocedural laboratory examination: Secondary | ICD-10-CM

## 2017-08-05 DIAGNOSIS — I10 Essential (primary) hypertension: Secondary | ICD-10-CM | POA: Diagnosis not present

## 2017-08-05 DIAGNOSIS — I442 Atrioventricular block, complete: Secondary | ICD-10-CM

## 2017-08-05 DIAGNOSIS — M7989 Other specified soft tissue disorders: Secondary | ICD-10-CM

## 2017-08-05 DIAGNOSIS — I25119 Atherosclerotic heart disease of native coronary artery with unspecified angina pectoris: Secondary | ICD-10-CM

## 2017-08-05 DIAGNOSIS — R0602 Shortness of breath: Secondary | ICD-10-CM

## 2017-08-05 LAB — CUP PACEART INCLINIC DEVICE CHECK
Battery Remaining Longevity: 62 mo
Battery Voltage: 2.89 V
Brady Statistic RA Percent Paced: 99 %
Brady Statistic RV Percent Paced: 99.93 %
Date Time Interrogation Session: 20190313162858
Implantable Lead Implant Date: 20130716
Implantable Lead Implant Date: 20130716
Implantable Lead Location: 753859
Implantable Lead Location: 753860
Implantable Lead Model: 1948
Implantable Pulse Generator Implant Date: 20130716
Lead Channel Impedance Value: 362.5 Ohm
Lead Channel Impedance Value: 612.5 Ohm
Lead Channel Pacing Threshold Amplitude: 0.75 V
Lead Channel Pacing Threshold Amplitude: 0.75 V
Lead Channel Pacing Threshold Amplitude: 0.75 V
Lead Channel Pacing Threshold Amplitude: 0.75 V
Lead Channel Pacing Threshold Pulse Width: 0.5 ms
Lead Channel Pacing Threshold Pulse Width: 0.5 ms
Lead Channel Pacing Threshold Pulse Width: 0.5 ms
Lead Channel Pacing Threshold Pulse Width: 0.5 ms
Lead Channel Sensing Intrinsic Amplitude: 12 mV
Lead Channel Sensing Intrinsic Amplitude: 2.1 mV
Lead Channel Setting Pacing Amplitude: 1 V
Lead Channel Setting Pacing Amplitude: 1.5 V
Lead Channel Setting Pacing Pulse Width: 0.5 ms
Lead Channel Setting Sensing Sensitivity: 4 mV
Pulse Gen Model: 2210
Pulse Gen Serial Number: 7356500

## 2017-08-05 NOTE — H&P (View-Only) (Signed)
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  Since last being seen in our clinic, the patient reports doing reasonably well.  He reports progressive worsening in SOB.  This is most prominent with exertion.  He has occasional R leg edema.  This has been quite prominent over the past 3 days.  His workup has included an echo which reveals significant pulmonary hypertension.   CTA chest 04/03/17 did not reveal PTE.  myoview 05/15/17 was low risk however cath 12/28/15 did reveal OM1 75% stenosis for which medical management was advised as first line with possible PCI if symptoms worsened.  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  He has since been evaluated by pulmonary team. Dr [name] who is concerned about the pulmonary hypertension and recommends RHC to further evaluate.  Today, he denies symptoms of palpitations, chest pain, dizziness, presyncope, or syncope.  his wife is very worried about his unilateral leg swelling which she feels has been present x 3 days. The patient is otherwise without complaint today.   Past Medical History:  Diagnosis Date  . BPH (benign prostatic hypertrophy)   . CAD (coronary artery disease) 12/2015   Cath by Dr [name] reveals distal and small vessel CAD.  Medical therapy advised.  . 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 Diabetes mellitus without complication ([place])   . Hypertension   . Hypothyroidism   . Rheumatoid arthritis(714.0)    Past Surgical History:  Procedure Laterality Date  . BACK SURGERY    . CARDIAC CATHETERIZATION N/A 12/28/2015   Procedure: Left Heart Cath and Coronary Angiography;  Surgeon: Belva Crome, MD;  Location: [facility];  Service: Cardiovascular;  Laterality: N/A;  . cataracts    . PACEMAKER INSERTION  12/09/11   SJM Accent DR RF implanted by DR Quilla Freeze for complete heart block and syncope  .  PERMANENT PACEMAKER INSERTION N/A 12/09/2011   Procedure: PERMANENT PACEMAKER INSERTION;  Surgeon: [name], MD;  Location: [facility] CATH LAB;  Service: Cardiovascular;  Laterality: N/A;  . REPLACEMENT TOTAL KNEE      ROS- all systems are reviewed and negative except as per HPI above  Current Outpatient Medications  Medication Sig Dispense Refill  . aspirin EC 81 MG tablet Take 81 mg by mouth daily.    Marland Kitchen BLACK PEPPER-TURMERIC PO Take 1 capsule by mouth 2 (two) times daily.    . Coenzyme Q10 (CO Q 10) 100 MG CAPS Take 100 mg by mouth daily.    Marland Kitchen glimepiride (AMARYL) 2 MG tablet Take 2 mg by mouth daily.    Marland Kitchen leflunomide (ARAVA) 10 MG tablet Take 1 tablet by mouth daily.    Marland Kitchen lisinopril (PRINIVIL,ZESTRIL) 10 MG tablet Take 1 tablet (10 mg total) by mouth daily. 90 tablet 3  . metFORMIN (GLUCOPHAGE) 500 MG tablet Take 500 mg by mouth 2 (two) times daily.    . mirabegron ER (MYRBETRIQ) 50 MG TB24 tablet Take 1 tablet (50 mg total) by mouth daily. 30 tablet 11  . nebivolol (BYSTOLIC) 5 MG tablet Take 5 mg by mouth daily.    . nitroGLYCERIN (NITROSTAT) 0.4 MG SL tablet DISSOLVE 1 TABLET UNDER THE TONGUE EVERY 5 MINUTES AS NEEDED FOR CHEST PAIN. 75 tablet 1  . Omega-3 Fatty Acids (OMEGA 3 PO) Take 1 tablet 2 (two) times daily by mouth.    Marland Kitchen  thyroid (ARMOUR) 90 MG tablet Take 90 mg by mouth daily.    . Turmeric (CURCUMIN 95 PO) Take 1 tablet by mouth daily.     No current facility-administered medications for this visit.     Physical Exam: Vitals:   08/05/17 1539  BP: (!) 142/62  Pulse: 60  Weight: 190 lb (86.2 kg)  Height: 5' 7" (1.702 m)    GEN- The patient is well appearing, alert and oriented x 3 today.   Head- normocephalic, atraumatic Eyes-  Sclera clear, conjunctiva pink Ears- hearing intact Oropharynx- clear Lungs- Clear to ausculation bilaterally, normal work of breathing Chest- pacemaker pocket is well healed Heart- Regular rate and rhythm, no murmurs, rubs or gallops, PMI  not laterally displaced GI- soft, NT, ND, + BS Extremities- no clubbing, cyanosis, +2 unilateral R leg swelling edema  Pacemaker interrogation- reviewed in detail today,  See PACEART report  ekg tracing ordered today is personally reviewed and shows AV paced rhythm  Cath 2017 reviewed Dr Jani Gravel note reviewed Prior echo reviewed  Assessment and Plan:  1. Symptomatic complete heart block Normal pacemaker function See Pace Art report No changes today Consider AV optimization depending on results of RHC/LHC  2. SOB  Unclear etiology Likely multifactorial I share Dr Golden Pop concern about pulmonary hypertension.  Further workup would be appropriate.  I will therefore order RHC.  Given prior known OM1 disease and worsening symptoms, I also worry about progressive CAD as a possible cause of his symptoms. I would therefore recommend right and left heart catheterization with possible PCI.  Discussed the cath with the patient. The patient understands that risks included but are not limited to stroke (1 in 1000), death (1 in 88), kidney failure [usually temporary] (1 in 500), bleeding (1 in 200), allergic reaction [possibly serious] (1 in 200). The patient understands and agrees to proceed. We will schedule cath at the next available time.  3. R leg edema Acute (3 days) Will order RLE doppler to evaluate for DVT. Prior study reviewed with patient however given acute change will repeat study.  4. HTN Lisinopril added at last visit.  Could also be a cause for his SOB. No change today  Cath as above Return to see me in 4 weeks May benefit from AV optimization depending on what we find at cath.  Very complicated patient with SOB of unclear cause.  Extensive chart review as well as consultation by Epic with Dr [name] required.  A high level of decision making was required for this encounter.  [name] MD, [facility] 08/05/2017 4:12 PM

## 2017-08-05 NOTE — Patient Instructions (Addendum)
Medication Instructions:  Your physician recommends that you continue on your current medications as directed. Please refer to the Current Medication list given to you today.  Labwork: Your physician recommends that you return for lab work tomorrow: BMET & CBC  Testing/Procedures: Your physician has requested that you have a lower extremity venous doppler. This test is an ultrasound of the veins in the legs or arms. It looks at venous blood flow that carries blood from the heart to the legs or arms. Allow one hour for a Lower Venous exam. There are no restrictions or special instructions.  Your physician has requested that you have a cardiac catheterization. Cardiac catheterization is used to diagnose and/or treat various heart conditions. Doctors may recommend this procedure for a number of different reasons. The most common reason is to evaluate chest pain. Chest pain can be a symptom of coronary artery disease (CAD), and cardiac catheterization can show whether plaque is narrowing or blocking your heart's arteries. This procedure is also used to evaluate the valves, as well as measure the blood flow and oxygen levels in different parts of your heart. For further information please visit [email or web address]. Please follow instructions below.  Follow-Up: Your physician recommends that you schedule a follow-up appointment in: 4 weeks with [name].  * If you need a refill on your cardiac medications before your next appointment, please call your pharmacy.   *Please note that any paperwork needing to be filled out by the provider will need to be addressed at the front desk prior to seeing the provider. Please note that any FMLA, disability or other documents regarding health condition is subject to a $25.00 charge that must be received prior to completion of paperwork in the form of a money order or check.  Thank you for choosing [facility]!!   Trinidad Curet, RN [phone]  Any Other  Special Instructions Will Be Listed Below (If Applicable).   You are scheduled for a Cardiac Catheterization on Tuesday, March 19 with Dr. [name].  1. Please arrive at the [facility] (Main Entrance A) at [facility]: [address] [place], [place] [address] at 8:00 AM (two hours before your procedure to ensure your preparation). Free valet parking service is available.   Special note: Every effort is made to have your procedure done on time. Please understand that emergencies sometimes delay scheduled procedures.  2. Diet: Do not eat or drink anything after midnight prior to your procedure except sips of water to take medications.  3. Labs: You will need to have blood drawn on Thursday, March 14 at [place], [place]  Open: [place] (Lunch 12:30 - 1:30)   Phone: [phone]. You do not need to be fasting.  4. Medication instructions in preparation for your procedure:  Stop taking, Glucophage (Metformin) on Sunday, 08/09/17  You will take your last dose on Sunday, 3/17.  You will not be able to take this medication for 48 hours after this procedure.  On the morning of your procedure, take your Aspirin and any morning medicines NOT listed above.  You may use sips of water.  5. Plan for one night stay--bring personal belongings. 6. Bring a current list of your medications and current insurance cards. 7. You MUST have a responsible person to drive you home. 8. Someone MUST be with you the first 24 hours after you arrive home or your discharge will be delayed. 9. Please wear clothes that are easy  to get on and off and wear slip-on shoes.  Thank you for allowing [place] to care for you!   -- [facility]

## 2017-08-05 NOTE — Telephone Encounter (Signed)
I agree.  Right and left heart cath discussed with him today. Will proceed next week.  Thanks for your help with him!

## 2017-08-05 NOTE — Progress Notes (Signed)
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  Since last being seen in our clinic, the patient reports doing reasonably well.  He reports progressive worsening in SOB.  This is most prominent with exertion.  He has occasional R leg edema.  This has been quite prominent over the past 3 days.  His workup has included an echo which reveals significant pulmonary hypertension.   CTA chest 04/03/17 did not reveal PTE.  myoview 05/15/17 was low risk however cath 12/28/15 did reveal OM1 75% stenosis for which medical management was advised as first line with possible PCI if symptoms worsened.  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  He has since been evaluated by pulmonary team. Dr [name] who is concerned about the pulmonary hypertension and recommends RHC to further evaluate.  Today, he denies symptoms of palpitations, chest pain, dizziness, presyncope, or syncope.  his wife is very worried about his unilateral leg swelling which she feels has been present x 3 days. The patient is otherwise without complaint today.   Past Medical History:  Diagnosis Date  . BPH (benign prostatic hypertrophy)   . CAD (coronary artery disease) 12/2015   Cath by Dr [name] reveals distal and small vessel CAD.  Medical therapy advised.  . 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 Diabetes mellitus without complication ([place])   . Hypertension   . Hypothyroidism   . Rheumatoid arthritis(714.0)    Past Surgical History:  Procedure Laterality Date  . BACK SURGERY    . CARDIAC CATHETERIZATION N/A 12/28/2015   Procedure: Left Heart Cath and Coronary Angiography;  Surgeon: Belva Crome, MD;  Location: [facility];  Service: Cardiovascular;  Laterality: N/A;  . cataracts    . PACEMAKER INSERTION  12/09/11   SJM Accent DR RF implanted by DR Loel Betancur for complete heart block and syncope  .  PERMANENT PACEMAKER INSERTION N/A 12/09/2011   Procedure: PERMANENT PACEMAKER INSERTION;  Surgeon: [name], MD;  Location: [facility] CATH LAB;  Service: Cardiovascular;  Laterality: N/A;  . REPLACEMENT TOTAL KNEE      ROS- all systems are reviewed and negative except as per HPI above  Current Outpatient Medications  Medication Sig Dispense Refill  . aspirin EC 81 MG tablet Take 81 mg by mouth daily.    Marland Kitchen BLACK PEPPER-TURMERIC PO Take 1 capsule by mouth 2 (two) times daily.    . Coenzyme Q10 (CO Q 10) 100 MG CAPS Take 100 mg by mouth daily.    Marland Kitchen glimepiride (AMARYL) 2 MG tablet Take 2 mg by mouth daily.    Marland Kitchen leflunomide (ARAVA) 10 MG tablet Take 1 tablet by mouth daily.    Marland Kitchen lisinopril (PRINIVIL,ZESTRIL) 10 MG tablet Take 1 tablet (10 mg total) by mouth daily. 90 tablet 3  . metFORMIN (GLUCOPHAGE) 500 MG tablet Take 500 mg by mouth 2 (two) times daily.    . mirabegron ER (MYRBETRIQ) 50 MG TB24 tablet Take 1 tablet (50 mg total) by mouth daily. 30 tablet 11  . nebivolol (BYSTOLIC) 5 MG tablet Take 5 mg by mouth daily.    . nitroGLYCERIN (NITROSTAT) 0.4 MG SL tablet DISSOLVE 1 TABLET UNDER THE TONGUE EVERY 5 MINUTES AS NEEDED FOR CHEST PAIN. 75 tablet 1  . Omega-3 Fatty Acids (OMEGA 3 PO) Take 1 tablet 2 (two) times daily by mouth.    Marland Kitchen  thyroid (ARMOUR) 90 MG tablet Take 90 mg by mouth daily.    . Turmeric (CURCUMIN 95 PO) Take 1 tablet by mouth daily.     No current facility-administered medications for this visit.     Physical Exam: Vitals:   08/05/17 1539  BP: (!) 142/62  Pulse: 60  Weight: 190 lb (86.2 kg)  Height: 5' 7" (1.702 m)    GEN- The patient is well appearing, alert and oriented x 3 today.   Head- normocephalic, atraumatic Eyes-  Sclera clear, conjunctiva pink Ears- hearing intact Oropharynx- clear Lungs- Clear to ausculation bilaterally, normal work of breathing Chest- pacemaker pocket is well healed Heart- Regular rate and rhythm, no murmurs, rubs or gallops, PMI  not laterally displaced GI- soft, NT, ND, + BS Extremities- no clubbing, cyanosis, +2 unilateral R leg swelling edema  Pacemaker interrogation- reviewed in detail today,  See PACEART report  ekg tracing ordered today is personally reviewed and shows AV paced rhythm  Cath 2017 reviewed Dr Jani Gravel note reviewed Prior echo reviewed  Assessment and Plan:  1. Symptomatic complete heart block Normal pacemaker function See Pace Art report No changes today Consider AV optimization depending on results of RHC/LHC  2. SOB  Unclear etiology Likely multifactorial I share Dr Golden Pop concern about pulmonary hypertension.  Further workup would be appropriate.  I will therefore order RHC.  Given prior known OM1 disease and worsening symptoms, I also worry about progressive CAD as a possible cause of his symptoms. I would therefore recommend right and left heart catheterization with possible PCI.  Discussed the cath with the patient. The patient understands that risks included but are not limited to stroke (1 in 1000), death (1 in 88), kidney failure [usually temporary] (1 in 500), bleeding (1 in 200), allergic reaction [possibly serious] (1 in 200). The patient understands and agrees to proceed. We will schedule cath at the next available time.  3. R leg edema Acute (3 days) Will order RLE doppler to evaluate for DVT. Prior study reviewed with patient however given acute change will repeat study.  4. HTN Lisinopril added at last visit.  Could also be a cause for his SOB. No change today  Cath as above Return to see me in 4 weeks May benefit from AV optimization depending on what we find at cath.  Very complicated patient with SOB of unclear cause.  Extensive chart review as well as consultation by Epic with Dr [name] required.  A high level of decision making was required for this encounter.  [name] MD, [facility] 08/05/2017 4:12 PM

## 2017-08-06 ENCOUNTER — Ambulatory Visit (HOSPITAL_COMMUNITY)
Admission: RE | Admit: 2017-08-06 | Discharge: 2017-08-06 | Disposition: A | Discharge status: Home / Self Care | Payer: Medicare Other | Source: Ambulatory Visit | Attending: Cardiovascular Disease | Admitting: Cardiovascular Disease

## 2017-08-06 DIAGNOSIS — M79604 Pain in right leg: Secondary | ICD-10-CM | POA: Diagnosis not present

## 2017-08-06 DIAGNOSIS — M7989 Other specified soft tissue disorders: Secondary | ICD-10-CM | POA: Diagnosis not present

## 2017-08-06 NOTE — Addendum Note (Signed)
Addended by: Stanton Kidney on: 08/06/2017 09:08 AM   Modules accepted: Orders

## 2017-08-06 NOTE — Addendum Note (Signed)
Addended by: Marlis Edelson C on: 08/06/2017 04:56 PM   Modules accepted: Orders

## 2017-08-07 ENCOUNTER — Other Ambulatory Visit: Payer: Self-pay | Admitting: Nurse Practitioner

## 2017-08-07 ENCOUNTER — Telehealth: Payer: Self-pay | Admitting: *Deleted

## 2017-08-07 NOTE — Telephone Encounter (Signed)
-----  Message from Stanton Kidney, RN sent at 08/07/2017  1:03 PM EDT ----- Regarding: FW: Cath Orders for 3/19 Forwarding to triage to ask them to call and reschedule cath per [facility] instructions below.  ----- Message ----- From: Patsey Berthold, NP Sent: 08/06/2017   1:17 PM To: Stanton Kidney, RN Subject: RE: Cath Orders for 3/19                       Will do!  I talked with Dr Aundra Dubin about him today, as we are looking for pulmonary hypertension, we would like for him to do cath.  He is able to do but needs to be either early on Tuesday or at lunch when he has a break in clinic.  Can you reschedule to [facility] and ask the patient to come at the earlier (or later) time?  Thank you! amber ----- Message ----- From: Stanton Kidney, RN Sent: 08/06/2017   9:25 AM To: Patsey Berthold, NP Subject: Cath Orders for 3/19                           Again, sorry.Marland KitchenMarland KitchenMarland KitchenAllred ordered cath Cath is 3/19 w/ Claiborne Billings Can you place orders?  (when you are in office sometime, maybe you can go over process with me and I would be glad to place orders when needed)  thx

## 2017-08-07 NOTE — Telephone Encounter (Signed)
CATH RESCHEDULED TO 12:30 PT NEEDS TO BE THERE AT SHORT STAY AT 10:30 AM WITH DR [facility] ./CY

## 2017-08-07 NOTE — Telephone Encounter (Signed)
LM FOR PT TO CALL BACK .Adonis Housekeeper

## 2017-08-07 NOTE — Telephone Encounter (Signed)
PT AWARE OF TIME AND MD CHANGE FOR CATH ON Tuesday  ./CY

## 2017-08-11 ENCOUNTER — Ambulatory Visit (HOSPITAL_COMMUNITY)
Admission: RE | Admit: 2017-08-11 | Discharge: 2017-08-12 | Disposition: A | Discharge status: Home / Self Care | Payer: Medicare Other | Source: Ambulatory Visit | Attending: Cardiology | Admitting: Cardiology

## 2017-08-11 ENCOUNTER — Ambulatory Visit (HOSPITAL_COMMUNITY)
Admission: RE | Disposition: A | Discharge status: Home / Self Care | Payer: Self-pay | Source: Ambulatory Visit | Attending: Cardiology

## 2017-08-11 ENCOUNTER — Other Ambulatory Visit: Payer: Self-pay

## 2017-08-11 ENCOUNTER — Encounter (HOSPITAL_COMMUNITY): Payer: Self-pay | Admitting: General Practice

## 2017-08-11 DIAGNOSIS — Z79890 Hormone replacement therapy (postmenopausal): Secondary | ICD-10-CM | POA: Insufficient documentation

## 2017-08-11 DIAGNOSIS — M069 Rheumatoid arthritis, unspecified: Secondary | ICD-10-CM | POA: Diagnosis not present

## 2017-08-11 DIAGNOSIS — I25118 Atherosclerotic heart disease of native coronary artery with other forms of angina pectoris: Secondary | ICD-10-CM

## 2017-08-11 DIAGNOSIS — Z950 Presence of cardiac pacemaker: Secondary | ICD-10-CM | POA: Diagnosis not present

## 2017-08-11 DIAGNOSIS — Z7984 Long term (current) use of oral hypoglycemic drugs: Secondary | ICD-10-CM | POA: Diagnosis not present

## 2017-08-11 DIAGNOSIS — E119 Type 2 diabetes mellitus without complications: Secondary | ICD-10-CM | POA: Diagnosis not present

## 2017-08-11 DIAGNOSIS — Z7982 Long term (current) use of aspirin: Secondary | ICD-10-CM | POA: Diagnosis not present

## 2017-08-11 DIAGNOSIS — I272 Pulmonary hypertension, unspecified: Secondary | ICD-10-CM | POA: Insufficient documentation

## 2017-08-11 DIAGNOSIS — E039 Hypothyroidism, unspecified: Secondary | ICD-10-CM | POA: Insufficient documentation

## 2017-08-11 DIAGNOSIS — I25110 Atherosclerotic heart disease of native coronary artery with unstable angina pectoris: Secondary | ICD-10-CM

## 2017-08-11 DIAGNOSIS — Z79899 Other long term (current) drug therapy: Secondary | ICD-10-CM | POA: Diagnosis not present

## 2017-08-11 DIAGNOSIS — Z955 Presence of coronary angioplasty implant and graft: Secondary | ICD-10-CM

## 2017-08-11 DIAGNOSIS — I10 Essential (primary) hypertension: Secondary | ICD-10-CM | POA: Insufficient documentation

## 2017-08-11 DIAGNOSIS — I2510 Atherosclerotic heart disease of native coronary artery without angina pectoris: Secondary | ICD-10-CM | POA: Diagnosis not present

## 2017-08-11 DIAGNOSIS — N400 Enlarged prostate without lower urinary tract symptoms: Secondary | ICD-10-CM | POA: Insufficient documentation

## 2017-08-11 HISTORY — DX: Dependence on other enabling machines and devices: Z99.89

## 2017-08-11 HISTORY — PX: RIGHT/LEFT HEART CATH AND CORONARY ANGIOGRAPHY: CATH118266

## 2017-08-11 HISTORY — PX: CORONARY STENT INTERVENTION: CATH118234

## 2017-08-11 HISTORY — DX: Personal history of urinary calculi: Z87.442

## 2017-08-11 HISTORY — DX: Personal history of other medical treatment: Z92.89

## 2017-08-11 HISTORY — DX: Atherosclerotic heart disease of native coronary artery with unstable angina pectoris: I25.110

## 2017-08-11 HISTORY — DX: Obstructive sleep apnea (adult) (pediatric): G47.33

## 2017-08-11 HISTORY — DX: Rheumatoid arthritis, unspecified: M06.9

## 2017-08-11 HISTORY — DX: Type 2 diabetes mellitus without complications: E11.9

## 2017-08-11 HISTORY — PX: CORONARY ANGIOPLASTY WITH STENT PLACEMENT: SHX49

## 2017-08-11 HISTORY — DX: Presence of cardiac pacemaker: Z95.0

## 2017-08-11 HISTORY — DX: Heart failure, unspecified: I50.9

## 2017-08-11 LAB — POCT I-STAT 3, VENOUS BLOOD GAS (G3P V)
Acid-base deficit: 2 mmol/L (ref 0.0–2.0)
Acid-base deficit: 2 mmol/L (ref 0.0–2.0)
Bicarbonate: 24.5 mmol/L (ref 20.0–28.0)
Bicarbonate: 24.6 mmol/L (ref 20.0–28.0)
O2 Saturation: 62 %
O2 Saturation: 63 %
TCO2: 26 mmol/L (ref 22–32)
TCO2: 26 mmol/L (ref 22–32)
pCO2, Ven: 45.9 mmHg (ref 44.0–60.0)
pCO2, Ven: 47.3 mmHg (ref 44.0–60.0)
pH, Ven: 7.325 (ref 7.250–7.430)
pH, Ven: 7.334 (ref 7.250–7.430)
pO2, Ven: 35 mmHg (ref 32.0–45.0)
pO2, Ven: 35 mmHg (ref 32.0–45.0)

## 2017-08-11 LAB — GLUCOSE, CAPILLARY
Glucose-Capillary: 83 mg/dL (ref 65–99)
Glucose-Capillary: 55 mg/dL — ABNORMAL LOW (ref 65–99)
Glucose-Capillary: 82 mg/dL (ref 65–99)
Glucose-Capillary: 89 mg/dL (ref 65–99)

## 2017-08-11 LAB — CBC
HCT: 40.8 % (ref 39.0–52.0)
Hemoglobin: 13.4 g/dL (ref 13.0–17.0)
MCH: 30.4 pg (ref 26.0–34.0)
MCHC: 32.8 g/dL (ref 30.0–36.0)
MCV: 92.5 fL (ref 78.0–100.0)
Platelets: 179 10*3/uL (ref 150–400)
RBC: 4.41 MIL/uL (ref 4.22–5.81)
RDW: 14.3 % (ref 11.5–15.5)
WBC: 7.2 10*3/uL (ref 4.0–10.5)

## 2017-08-11 LAB — BASIC METABOLIC PANEL
Anion gap: 7 (ref 5–15)
BUN: 18 mg/dL (ref 6–20)
CO2: 26 mmol/L (ref 22–32)
Calcium: 9.5 mg/dL (ref 8.9–10.3)
Chloride: 106 mmol/L (ref 101–111)
Creatinine, Ser: 1.03 mg/dL (ref 0.61–1.24)
GFR calc Af Amer: >60 mL/min (ref >60)
GFR calc non Af Amer: >60 mL/min (ref >60)
Glucose, Bld: 94 mg/dL (ref 65–99)
Potassium: 4.3 mmol/L (ref 3.5–5.1)
Sodium: 139 mmol/L (ref 135–145)

## 2017-08-11 LAB — PROTIME-INR
INR: 1.07
Prothrombin Time: 13.8 seconds (ref 11.4–15.2)

## 2017-08-11 LAB — POCT ACTIVATED CLOTTING TIME: Activated Clotting Time: 670 seconds

## 2017-08-11 SURGERY — RIGHT/LEFT HEART CATH AND CORONARY ANGIOGRAPHY
Anesthesia: LOCAL

## 2017-08-11 MED ORDER — CLOPIDOGREL BISULFATE 75 MG PO TABS
75.0000 mg | ORAL_TABLET | Freq: Every day | ORAL | Status: DC
  Administered 2017-08-12: 08:00:00 75 mg via ORAL
  Filled 2017-08-11: qty 1

## 2017-08-11 MED ORDER — ANGIOPLASTY BOOK
Freq: Once | Status: AC
  Administered 2017-08-12: 04:00:00
  Filled 2017-08-11: qty 1

## 2017-08-11 MED ORDER — BIVALIRUDIN TRIFLUOROACETATE 250 MG IV SOLR
INTRAVENOUS | Status: AC
  Filled 2017-08-11: qty 250

## 2017-08-11 MED ORDER — MIDAZOLAM HCL 2 MG/2ML IJ SOLN
INTRAMUSCULAR | Status: AC
  Filled 2017-08-11: qty 2

## 2017-08-11 MED ORDER — SODIUM CHLORIDE 0.9% FLUSH
3.0000 mL | Freq: Two times a day (BID) | INTRAVENOUS | Status: DC
  Administered 2017-08-11 – 2017-08-12 (×2): 3 mL via INTRAVENOUS

## 2017-08-11 MED ORDER — ACETAMINOPHEN 325 MG PO TABS: 650.0000 mg | ORAL_TABLET | ORAL | Status: DC | PRN

## 2017-08-11 MED ORDER — MIDAZOLAM HCL 2 MG/2ML IJ SOLN
INTRAMUSCULAR | Status: DC | PRN
  Administered 2017-08-11: 1 mg via INTRAVENOUS

## 2017-08-11 MED ORDER — ONDANSETRON HCL 4 MG/2ML IJ SOLN: 4.0000 mg | Freq: Four times a day (QID) | INTRAMUSCULAR | Status: DC | PRN

## 2017-08-11 MED ORDER — HEPARIN SODIUM (PORCINE) 1000 UNIT/ML IJ SOLN
INTRAMUSCULAR | Status: AC
  Filled 2017-08-11: qty 1

## 2017-08-11 MED ORDER — ASPIRIN 81 MG PO CHEW
81.0000 mg | CHEWABLE_TABLET | Freq: Every day | ORAL | Status: DC
  Administered 2017-08-12: 81 mg via ORAL
  Filled 2017-08-11: qty 1

## 2017-08-11 MED ORDER — HEPARIN (PORCINE) IN NACL 2-0.9 UNIT/ML-% IJ SOLN
INTRAMUSCULAR | Status: AC | PRN
  Administered 2017-08-11 (×2): 500 mL

## 2017-08-11 MED ORDER — HEPARIN SODIUM (PORCINE) 1000 UNIT/ML IJ SOLN
INTRAMUSCULAR | Status: DC | PRN
  Administered 2017-08-11: 4000 [IU] via INTRAVENOUS

## 2017-08-11 MED ORDER — LIDOCAINE HCL (PF) 1 % IJ SOLN
INTRAMUSCULAR | Status: AC
  Filled 2017-08-11: qty 30

## 2017-08-11 MED ORDER — DIAZEPAM 5 MG PO TABS: 5.0000 mg | ORAL_TABLET | Freq: Four times a day (QID) | ORAL | Status: DC | PRN

## 2017-08-11 MED ORDER — FENTANYL CITRATE (PF) 100 MCG/2ML IJ SOLN
INTRAMUSCULAR | Status: DC | PRN
  Administered 2017-08-11: 25 ug via INTRAVENOUS

## 2017-08-11 MED ORDER — NITROGLYCERIN 1 MG/10 ML FOR IR/CATH LAB
INTRA_ARTERIAL | Status: DC | PRN
Start: 2017-08-11 — End: 2017-08-11
  Administered 2017-08-11 (×3): 200 ug via INTRACORONARY

## 2017-08-11 MED ORDER — IOPAMIDOL (ISOVUE-370) INJECTION 76%
INTRAVENOUS | Status: DC | PRN
  Administered 2017-08-11: 145 mL

## 2017-08-11 MED ORDER — LABETALOL HCL 5 MG/ML IV SOLN: 10.0000 mg | INTRAVENOUS | Status: AC | PRN

## 2017-08-11 MED ORDER — IOPAMIDOL (ISOVUE-370) INJECTION 76%
INTRAVENOUS | Status: AC
  Filled 2017-08-11: qty 100

## 2017-08-11 MED ORDER — SODIUM CHLORIDE 0.9 % IV SOLN: INTRAVENOUS | Status: DC

## 2017-08-11 MED ORDER — SODIUM CHLORIDE 0.9% FLUSH: 3.0000 mL | INTRAVENOUS | Status: DC | PRN

## 2017-08-11 MED ORDER — VERAPAMIL HCL 2.5 MG/ML IV SOLN
INTRAVENOUS | Status: DC | PRN
  Administered 2017-08-11: 10 mL via INTRA_ARTERIAL

## 2017-08-11 MED ORDER — CLOPIDOGREL BISULFATE 300 MG PO TABS
ORAL_TABLET | ORAL | Status: DC | PRN
  Administered 2017-08-11: 600 mg via ORAL

## 2017-08-11 MED ORDER — SODIUM CHLORIDE 0.9 % WEIGHT BASED INFUSION: 3.0000 mL/kg/h | INTRAVENOUS | Status: DC

## 2017-08-11 MED ORDER — BIVALIRUDIN TRIFLUOROACETATE 250 MG IV SOLR
INTRAVENOUS | Status: AC | PRN
  Administered 2017-08-11: 1.75 mg/kg/h
  Administered 2017-08-11: 1.75 mg/kg/h via INTRAVENOUS

## 2017-08-11 MED ORDER — LIDOCAINE HCL (PF) 1 % IJ SOLN
INTRAMUSCULAR | Status: DC | PRN
  Administered 2017-08-11 (×2): 2 mL

## 2017-08-11 MED ORDER — HEPARIN (PORCINE) IN NACL 2-0.9 UNIT/ML-% IJ SOLN
INTRAMUSCULAR | Status: AC
  Filled 2017-08-11: qty 1000

## 2017-08-11 MED ORDER — BIVALIRUDIN BOLUS VIA INFUSION - CUPID
INTRAVENOUS | Status: DC | PRN
  Administered 2017-08-11: 63.6 mg via INTRAVENOUS

## 2017-08-11 MED ORDER — HYDRALAZINE HCL 20 MG/ML IJ SOLN: 5.0000 mg | INTRAMUSCULAR | Status: AC | PRN

## 2017-08-11 MED ORDER — IOPAMIDOL (ISOVUE-370) INJECTION 76%
INTRAVENOUS | Status: AC
  Filled 2017-08-11: qty 50

## 2017-08-11 MED ORDER — FENTANYL CITRATE (PF) 100 MCG/2ML IJ SOLN
INTRAMUSCULAR | Status: AC
  Filled 2017-08-11: qty 2

## 2017-08-11 MED ORDER — SODIUM CHLORIDE 0.9 % WEIGHT BASED INFUSION: 1.0000 mL/kg/h | INTRAVENOUS | Status: DC

## 2017-08-11 MED ORDER — CLOPIDOGREL BISULFATE 300 MG PO TABS
ORAL_TABLET | ORAL | Status: AC
  Filled 2017-08-11: qty 2

## 2017-08-11 MED ORDER — SODIUM CHLORIDE 0.9 % IV SOLN: 250.0000 mL | INTRAVENOUS | Status: DC | PRN

## 2017-08-11 MED ORDER — VERAPAMIL HCL 2.5 MG/ML IV SOLN
INTRAVENOUS | Status: AC
  Filled 2017-08-11: qty 2

## 2017-08-11 SURGICAL SUPPLY — 29 items
BALLN SAPPHIRE 1.5X12 (BALLOONS) ×2
BALLN SAPPHIRE ~~LOC~~ 2.5X12 (BALLOONS) ×1 IMPLANT
BALLN SAPPHIRE ~~LOC~~ 2.5X15 (BALLOONS) ×1 IMPLANT
BALLN SAPPHIRE ~~LOC~~ 3.0X12 (BALLOONS) ×1 IMPLANT
BALLN WOLVERINE 2.50X10 (BALLOONS) ×2
BALLOON SAPPHIRE 1.5X12 (BALLOONS) IMPLANT
BALLOON WOLVERINE 2.50X10 (BALLOONS) IMPLANT
BAND CMPR LRG ZPHR (HEMOSTASIS) ×1
BAND ZEPHYR COMPRESS 30 LONG (HEMOSTASIS) ×1 IMPLANT
CATH BALLN WEDGE 5F 110CM (CATHETERS) ×1 IMPLANT
CATH INFINITI 5 FR JL3.5 (CATHETERS) ×1 IMPLANT
CATH INFINITI 5FR ANG PIGTAIL (CATHETERS) ×1 IMPLANT
CATH INFINITI JR4 5F (CATHETERS) ×1 IMPLANT
CATH VISTA GUIDE 6FR XB3.5 (CATHETERS) ×1 IMPLANT
GUIDEWIRE INQWIRE 1.5J.035X260 (WIRE) IMPLANT
INQWIRE 1.5J .035X260CM (WIRE) ×2
KIT ENCORE 26 ADVANTAGE (KITS) ×1 IMPLANT
KIT HEART LEFT (KITS) ×2 IMPLANT
NDL PERC 21GX4CM (NEEDLE) IMPLANT
NEEDLE PERC 21GX4CM (NEEDLE) ×2 IMPLANT
PACK CARDIAC CATHETERIZATION (CUSTOM PROCEDURE TRAY) ×2 IMPLANT
SHEATH GLIDE SLENDER 4/5FR (SHEATH) ×1 IMPLANT
SHEATH RAIN RADIAL 21G 6FR (SHEATH) ×1 IMPLANT
STENT RESOLUTE ONYX 2.5X15 (Permanent Stent) ×1 IMPLANT
STENT RESOLUTE ONYX 3.0X18 (Permanent Stent) ×1 IMPLANT
TRANSDUCER W/STOPCOCK (MISCELLANEOUS) ×2 IMPLANT
TUBING CIL FLEX 10 FLL-RA (TUBING) ×2 IMPLANT
WIRE COUGAR XT STRL 190CM (WIRE) ×1 IMPLANT
WIRE PT2 MS 185 (WIRE) ×1 IMPLANT

## 2017-08-11 NOTE — Interval H&P Note (Signed)
History and Physical Interval Note:  08/11/2017 1:36 PM  Cameron [name]  has presented today for surgery, with the diagnosis of cp  The various methods of treatment have been discussed with the patient and family. After consideration of risks, benefits and other options for treatment, the patient has consented to  Procedure(s): RIGHT/LEFT HEART CATH AND CORONARY ANGIOGRAPHY (N/A) as a surgical intervention .  The patient's history has been reviewed, patient examined, no change in status, stable for surgery.  I have reviewed the patient's chart and labs.  Questions were answered to the patient's satisfaction.     [place] [employer]

## 2017-08-12 ENCOUNTER — Ambulatory Visit (INDEPENDENT_AMBULATORY_CARE_PROVIDER_SITE_OTHER): Payer: Medicare Other | Admitting: *Deleted

## 2017-08-12 ENCOUNTER — Telehealth: Payer: Self-pay | Admitting: Internal Medicine

## 2017-08-12 ENCOUNTER — Encounter (HOSPITAL_COMMUNITY): Payer: Self-pay | Admitting: Cardiology

## 2017-08-12 ENCOUNTER — Telehealth: Payer: Self-pay | Admitting: Cardiology

## 2017-08-12 DIAGNOSIS — I25110 Atherosclerotic heart disease of native coronary artery with unstable angina pectoris: Secondary | ICD-10-CM | POA: Diagnosis not present

## 2017-08-12 DIAGNOSIS — I2510 Atherosclerotic heart disease of native coronary artery without angina pectoris: Secondary | ICD-10-CM | POA: Diagnosis not present

## 2017-08-12 DIAGNOSIS — I208 Other forms of angina pectoris: Secondary | ICD-10-CM | POA: Diagnosis not present

## 2017-08-12 DIAGNOSIS — I10 Essential (primary) hypertension: Secondary | ICD-10-CM | POA: Diagnosis not present

## 2017-08-12 DIAGNOSIS — I442 Atrioventricular block, complete: Secondary | ICD-10-CM | POA: Diagnosis not present

## 2017-08-12 DIAGNOSIS — M069 Rheumatoid arthritis, unspecified: Secondary | ICD-10-CM | POA: Diagnosis not present

## 2017-08-12 DIAGNOSIS — E119 Type 2 diabetes mellitus without complications: Secondary | ICD-10-CM | POA: Diagnosis not present

## 2017-08-12 LAB — BASIC METABOLIC PANEL
Anion gap: 8 (ref 5–15)
BUN: 21 mg/dL — ABNORMAL HIGH (ref 6–20)
CO2: 24 mmol/L (ref 22–32)
Calcium: 8.4 mg/dL — ABNORMAL LOW (ref 8.9–10.3)
Chloride: 106 mmol/L (ref 101–111)
Creatinine, Ser: 0.99 mg/dL (ref 0.61–1.24)
GFR calc Af Amer: >60 mL/min (ref >60)
GFR calc non Af Amer: >60 mL/min (ref >60)
Glucose, Bld: 114 mg/dL — ABNORMAL HIGH (ref 65–99)
Potassium: 4.3 mmol/L (ref 3.5–5.1)
Sodium: 138 mmol/L (ref 135–145)

## 2017-08-12 LAB — CBC
HCT: 35.8 % — ABNORMAL LOW (ref 39.0–52.0)
Hemoglobin: 11.5 g/dL — ABNORMAL LOW (ref 13.0–17.0)
MCH: 29.4 pg (ref 26.0–34.0)
MCHC: 32.1 g/dL (ref 30.0–36.0)
MCV: 91.6 fL (ref 78.0–100.0)
Platelets: 176 10*3/uL (ref 150–400)
RBC: 3.91 MIL/uL — ABNORMAL LOW (ref 4.22–5.81)
RDW: 14.2 % (ref 11.5–15.5)
WBC: 7.7 10*3/uL (ref 4.0–10.5)

## 2017-08-12 LAB — GLUCOSE, CAPILLARY: Glucose-Capillary: 92 mg/dL (ref 65–99)

## 2017-08-12 MED ORDER — LISINOPRIL 10 MG PO TABS
10.0000 mg | ORAL_TABLET | Freq: Every day | ORAL | Status: DC
  Administered 2017-08-12: 10 mg via ORAL
  Filled 2017-08-12: qty 1

## 2017-08-12 MED ORDER — ATORVASTATIN CALCIUM 40 MG PO TABS: 40.0000 mg | ORAL_TABLET | Freq: Every day | ORAL | 6 refills | Status: DC

## 2017-08-12 MED ORDER — NEBIVOLOL HCL 5 MG PO TABS
5.0000 mg | ORAL_TABLET | Freq: Every day | ORAL | Status: DC
  Administered 2017-08-12: 5 mg via ORAL
  Filled 2017-08-12: qty 1

## 2017-08-12 MED ORDER — ATORVASTATIN CALCIUM 40 MG PO TABS: 40.0000 mg | ORAL_TABLET | Freq: Every day | ORAL | Status: DC

## 2017-08-12 MED ORDER — CLOPIDOGREL BISULFATE 75 MG PO TABS: 75.0000 mg | ORAL_TABLET | Freq: Every day | ORAL | 6 refills | Status: DC

## 2017-08-12 MED FILL — Heparin Sodium (Porcine) 2 Unit/ML in Sodium Chloride 0.9%: INTRAMUSCULAR | Qty: 1000 | Status: AC

## 2017-08-12 NOTE — Telephone Encounter (Signed)
New Message:    Pt is calling to get help doing a device check

## 2017-08-12 NOTE — Telephone Encounter (Signed)
Spoke with pt and reminded pt of remote transmission that is due today. Pt verbalized understanding.   

## 2017-08-12 NOTE — Discharge Summary (Signed)
Advanced Heart Failure Team  Discharge Summary   [name] MRN: [id number], DOB/AGE: [birth year] 77 y.o. Admit date: 08/11/2017, DOB/AGE: [birth year] 77 y.o. Admit date: 08/11/2017 D/C date:     08/12/2017   Primary Discharge Diagnoses:  1. CAD S/P DES OMI and Prox CX Continue DAPT x1 year. COntinue aspirin and plavix.  Add statin. Atorvastatin.  Cardiac rehab following.  2. H/O CHB  S/P PPM 3. HTN 4. DMII   Hospital Course:  Cameron [name] is a 77 year old with a history of hypothyroidism, CHB with PPM , DM, HTN, RA, and CAD.   Prior to admit he had unexplained dyspnea. He underwent RHC/LHC as noted below. He underwent PCI with DEX x3. Continue DAPT x 1 year. He was placed on statin. Cardiac rehab followed and referred for outpatient cath.   He will resume previous home medications. He will follow up with [place] clinic.   Discharge Vitals: Blood pressure (!) 191/54, pulse 61, temperature 98.7 F (37.1 C), temperature source Oral, resp. rate 13, height _0  (1.702 m), weight 182 lb 15.7 oz (83 kg), SpO2 96 %.  Labs: Lab Results  Component Value Date   WBC 7.7 08/12/2017   HGB 11.5 (L) 08/12/2017   HCT 35.8 (L) 08/12/2017   MCV 91.6 08/12/2017   PLT 176 08/12/2017    Recent Labs  Lab 08/12/17 0341  NA 138  K 4.3  CL 106  CO2 24  BUN 21*  CREATININE 0.99  CALCIUM 8.4*  GLUCOSE 114*   No results found for: CHOL, HDL, LDLCALC, TRIG BNP (last 3 results) No results for input(s): BNP in the last 8760 hours.  ProBNP (last 3 results) Recent Labs    04/02/17 1204  PROBNP 557*     Diagnostic Studies/Procedures   No results found.  Discharge Medications   Allergies as of 08/12/2017      Reactions   Celecoxib Rash   Skin rash      Medication List    TAKE these medications   aspirin EC 81 MG tablet Take 81 mg by mouth daily.   atorvastatin 40 MG tablet Commonly known as:  LIPITOR Take 1 tablet (40 mg total) by mouth daily at 6 PM.   BLACK PEPPER-TURMERIC PO Take 1 capsule by mouth 2  (two) times daily.   clopidogrel 75 MG tablet Commonly known as:  PLAVIX Take 1 tablet (75 mg total) by mouth daily with breakfast. Start taking on:  08/13/2017   Co Q 10 100 MG Caps Take 100 mg by mouth daily.   CURCUMIN 95 PO Take 1 tablet by mouth daily.   glimepiride 2 MG tablet Commonly known as:  AMARYL Take 2 mg by mouth daily.   leflunomide 10 MG tablet Commonly known as:  ARAVA Take 1 tablet by mouth daily.   lisinopril 10 MG tablet Commonly known as:  PRINIVIL,ZESTRIL Take 1 tablet (10 mg total) by mouth daily.   metFORMIN 500 MG tablet Commonly known as:  GLUCOPHAGE Take 500 mg by mouth 2 (two) times daily.   mirabegron ER 50 MG Tb24 tablet Commonly known as:  MYRBETRIQ Take 1 tablet (50 mg total) by mouth daily.   nebivolol 5 MG tablet Commonly known as:  BYSTOLIC Take 5 mg by mouth daily.   nitroGLYCERIN 0.4 MG SL tablet Commonly known as:  NITROSTAT DISSOLVE 1 TABLET UNDER THE TONGUE EVERY 5 MINUTES AS NEEDED FOR CHEST PAIN.   OMEGA 3 PO Take 1 tablet 2 (two) times daily by mouth.  thyroid 90 MG tablet Commonly known as:  ARMOUR Take 90 mg by mouth every morning.       Disposition   The patient will be discharged in stable condition to home. Discharge Instructions    Amb Referral to Cardiac Rehabilitation   Complete by:  As directed    Diagnosis:   Coronary Stents PTCA     Diet - low sodium heart healthy   Complete by:  As directed    Increase activity slowly   Complete by:  As directed    STOP any activity that causes chest pain, shortness of breath, dizziness, sweating, or exessive weakness   Complete by:  As directed      Follow-up Information    Lendon Colonel, NP Follow up on 08/26/2017.   Specialties:  Nurse Practitioner, Radiology, Cardiology Why:  at [place] for your follow up appt.  Contact information: [address] [place] [address] [phone]             Duration of Discharge Encounter:  Greater than 35 minutes   Signed, Batya Citron NP-C   08/12/2017, 9:13 AM

## 2017-08-12 NOTE — Progress Notes (Signed)
CARDIAC REHAB PHASE I   PRE:  Rate/Rhythm: 67 dual pacing    BP: sitting 144/54    SaO2:   MODE:  Ambulation: 1000 ft   POST:  Rate/Rhythm: 118 dual pacing    BP: sitting 194/54     SaO2:    Tolerated well. Sts his SOB is present but less. Ed completed with pt and wife. Good reception. Understands importance of Plavix. Interested in [facility] and will send referral to [place].  [place], ACSM 08/12/2017 9:06 AM

## 2017-08-12 NOTE — Progress Notes (Addendum)
Advanced Heart Failure Rounding Note  PCP-Cardiologist: No primary care provider on file.   Subjective:    Admitted post cath. Underwent PCI.  Denies chest pain. Denies SOB.  No long short of breath.    Objective:   Weight Range: 182 lb 15.7 oz (83 kg) Body mass index is 28.66 kg/m.   Vital Signs:   Temp:  [97.4 F (36.3 C)-98.7 F (37.1 C)] 98.7 F (37.1 C) (03/20 0700) Pulse Rate:  [0-68] 61 (03/20 0700) Resp:  [0-82] 22 (03/20 0815) BP: (121-169)/(25-82) 144/54 (03/20 0815) SpO2:  [0 %-100 %] 96 % (03/20 0700) Weight:  [182 lb 15.7 oz (83 kg)-187 lb (84.8 kg)] 182 lb 15.7 oz (83 kg) (03/20 0300) Last BM Date: 08/11/17  Weight change: Filed Weights   08/11/17 1034 08/12/17 0300  Weight: 187 lb (84.8 kg) 182 lb 15.7 oz (83 kg)    Intake/Output:   Intake/Output Summary (Last 24 hours) at 08/12/2017 0835 Last data filed at 08/12/2017 0355 Gross per 24 hour  Intake 1510 ml  Output -  Net 1510 ml      Physical Exam    General:  Well appearing. No resp difficulty. Sitting in the chair.  HEENT: Normal Neck: Supple. JVP . Carotids 2+ bilat; no bruits. No lymphadenopathy or thyromegaly appreciated. Cor: PMI nondisplaced. Regular rate & rhythm. No rubs, gallops or murmurs. Lungs: Clear Abdomen: Soft, nontender, nondistended. No hepatosplenomegaly. No bruits or masses. Good bowel sounds. Extremities: No cyanosis, clubbing, rash, edema Neuro: Alert & orientedx3, cranial nerves grossly intact. moves all 4 extremities w/o difficulty. Affect pleasant   Telemetry   A sensed V paced 60s   EKG    N/ A  Labs    CBC Recent Labs    08/11/17 1118 08/12/17 0341  WBC 7.2 7.7  HGB 13.4 11.5*  HCT 40.8 35.8*  MCV 92.5 91.6  PLT 179 297   Basic Metabolic Panel Recent Labs    08/11/17 1118 08/12/17 0341  NA 139 138  K 4.3 4.3  CL 106 106  CO2 26 24  GLUCOSE 94 114*  BUN 18 21*  CREATININE 1.03 0.99  CALCIUM 9.5 8.4*   Liver Function Tests No  results for input(s): AST, ALT, ALKPHOS, BILITOT, PROT, ALBUMIN in the last 72 hours. No results for input(s): LIPASE, AMYLASE in the last 72 hours. Cardiac Enzymes No results for input(s): CKTOTAL, CKMB, CKMBINDEX, TROPONINI in the last 72 hours.  BNP: BNP (last 3 results) No results for input(s): BNP in the last 8760 hours.  ProBNP (last 3 results) Recent Labs    04/02/17 1204  PROBNP 557*     D-Dimer No results for input(s): DDIMER in the last 72 hours. Hemoglobin A1C No results for input(s): HGBA1C in the last 72 hours. Fasting Lipid Panel No results for input(s): CHOL, HDL, LDLCALC, TRIG, CHOLHDL, LDLDIRECT in the last 72 hours. Thyroid Function Tests No results for input(s): TSH, T4TOTAL, T3FREE, THYROIDAB in the last 72 hours.  Invalid input(s): FREET3  Other results:   Imaging     No results found.   Medications:     Scheduled Medications: . aspirin  81 mg Oral Daily  . clopidogrel  75 mg Oral Q breakfast  . sodium chloride flush  3 mL Intravenous Q12H     Infusions: . sodium chloride 125 mL/hr at 08/11/17 1700  . sodium chloride 250 mL (08/11/17 1700)     PRN Medications:  sodium chloride, acetaminophen, diazepam, ondansetron (ZOFRAN) IV, sodium  chloride flush    Patient Profile  Admitted for RHC/LHC. Underwent PCI---stent.   Assessment/Plan   1. CAD S/P DES OMI and Prox CX Continue DAPT x1 year. COntinue aspirin and plavix.  Add statin. Atorvastatin. .  Cardiac rehab following.  2. H/O CHB  S/P PPM 3. HTN- restart home medications.   Follow up with Dr Claiborne Billings for coronary disease. He will be seen in the TOC 7-10 days.   Medication concerns reviewed with patient and pharmacy team. Barriers identified: None  Length of Stay: 0  Darrick Grinder, NP  08/12/2017, 8:35 AM  Advanced Heart Failure Team Pager [phone] (M-F; 7a - 4p)  Please contact [facility] for night-coverage after hours (4p -7a ) and weekends on [email or web address]  Patient  seen with NP, agree with the above note.  S/p PCI with DES to large OM and proximal LCx.  He has residual severe disease in medium-sized D3.  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   On RHC, his filling pressures were normal.  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   Doing well post-PCI today.   Followup in 2 wks.  Should participate in cardiac rehab program.   Meds for home:  ASA 81 Plavix 75 Atorvastatin 40 daily Lisinopril 10 daily nebivolol 5 daily.   Loralie Champagne 08/12/2017 9:17 AM

## 2017-08-12 NOTE — Progress Notes (Signed)
Right brachial sheath pulled at 2030 (08/12/17). Site is level 0 post sheath pull. Patient tolerated procedure well. Will continue to monitor.

## 2017-08-13 ENCOUNTER — Encounter: Payer: Self-pay | Admitting: Cardiology

## 2017-08-13 NOTE — Telephone Encounter (Signed)
Spoke w/ pt wife and informed her that pt remote transmission was received. Pt wife verbalized understanding.

## 2017-08-13 NOTE — Progress Notes (Signed)
Remote pacemaker transmission.   

## 2017-08-14 LAB — CUP PACEART REMOTE DEVICE CHECK
Battery Remaining Longevity: 58 mo
Battery Remaining Percentage: 51 %
Battery Voltage: 2.87 V
Brady Statistic AP VP Percent: 98 %
Brady Statistic AP VS Percent: 1 %
Brady Statistic AS VP Percent: 1.5 %
Brady Statistic AS VS Percent: 1 %
Brady Statistic RA Percent Paced: 98 %
Brady Statistic RV Percent Paced: 99 %
Date Time Interrogation Session: 20190321032410
Implantable Lead Implant Date: 20130716
Implantable Lead Implant Date: 20130716
Implantable Lead Location: 753859
Implantable Lead Location: 753860
Implantable Lead Model: 1948
Implantable Pulse Generator Implant Date: 20130716
Lead Channel Impedance Value: 310 Ohm
Lead Channel Impedance Value: 480 Ohm
Lead Channel Pacing Threshold Amplitude: 0.625 V
Lead Channel Pacing Threshold Amplitude: 1 V
Lead Channel Pacing Threshold Pulse Width: 0.5 ms
Lead Channel Pacing Threshold Pulse Width: 0.5 ms
Lead Channel Sensing Intrinsic Amplitude: 1.6 mV
Lead Channel Sensing Intrinsic Amplitude: 12 mV
Lead Channel Setting Pacing Amplitude: 1.25 V
Lead Channel Setting Pacing Amplitude: 1.625
Lead Channel Setting Pacing Pulse Width: 0.5 ms
Lead Channel Setting Sensing Sensitivity: 4 mV
Pulse Gen Model: 2210
Pulse Gen Serial Number: 7356500

## 2017-08-17 ENCOUNTER — Telehealth: Payer: Self-pay | Admitting: Internal Medicine

## 2017-08-17 NOTE — Telephone Encounter (Signed)
Spoke with Angela Nevin who states she will try to call pt today about getting scheduled.  Left message that they should expect a call from her today or tomorrow.  Requested they c/b if other questions or concerns.

## 2017-08-17 NOTE — Telephone Encounter (Signed)
New Message  Pt's wife states pt was suppose to be set up with [facility] but no one has contact them. Please call

## 2017-08-17 NOTE — Telephone Encounter (Signed)
Discharge Instructions    Amb Referral to Cardiac Rehabilitation   Complete by:  As directed    Diagnosis:   Coronary Stents PTCA      This was ordered at discharge from hospital for [facility]. Called and spoke with Cardiac Rehab at [facility] [phone].  Carla to call back to f/u.

## 2017-08-19 ENCOUNTER — Telehealth: Payer: Self-pay | Admitting: Internal Medicine

## 2017-08-19 NOTE — Telephone Encounter (Signed)
Patient is wanted to cut his grass with a riding lawnmower. Patient had recent heart cath with stent on 08/11/17. Consulted Dr. Lovena Le, DOD, he stated it would be fine for patient to cut his grass. Called patient's wife back with recommendations. Patient's wife verbalized understanding.

## 2017-08-19 NOTE — Telephone Encounter (Signed)
New message  Pt wife verbalized that she is calling for RN  Pt want to cut the grass please advise   sitting lawn mower but its very bouncy

## 2017-08-20 ENCOUNTER — Encounter: Payer: Medicare Other | Attending: Cardiology | Admitting: *Deleted

## 2017-08-20 VITALS — Ht 67.1 in | Wt 187.5 lb

## 2017-08-20 DIAGNOSIS — Z7902 Long term (current) use of antithrombotics/antiplatelets: Secondary | ICD-10-CM | POA: Diagnosis not present

## 2017-08-20 DIAGNOSIS — I509 Heart failure, unspecified: Secondary | ICD-10-CM | POA: Insufficient documentation

## 2017-08-20 DIAGNOSIS — I110 Hypertensive heart disease with heart failure: Secondary | ICD-10-CM | POA: Insufficient documentation

## 2017-08-20 DIAGNOSIS — M069 Rheumatoid arthritis, unspecified: Secondary | ICD-10-CM | POA: Diagnosis not present

## 2017-08-20 DIAGNOSIS — Z7984 Long term (current) use of oral hypoglycemic drugs: Secondary | ICD-10-CM | POA: Diagnosis not present

## 2017-08-20 DIAGNOSIS — E119 Type 2 diabetes mellitus without complications: Secondary | ICD-10-CM | POA: Insufficient documentation

## 2017-08-20 DIAGNOSIS — Z79899 Other long term (current) drug therapy: Secondary | ICD-10-CM | POA: Diagnosis not present

## 2017-08-20 DIAGNOSIS — G4733 Obstructive sleep apnea (adult) (pediatric): Secondary | ICD-10-CM | POA: Diagnosis not present

## 2017-08-20 DIAGNOSIS — E039 Hypothyroidism, unspecified: Secondary | ICD-10-CM | POA: Insufficient documentation

## 2017-08-20 DIAGNOSIS — I2510 Atherosclerotic heart disease of native coronary artery without angina pectoris: Secondary | ICD-10-CM | POA: Insufficient documentation

## 2017-08-20 DIAGNOSIS — Z955 Presence of coronary angioplasty implant and graft: Secondary | ICD-10-CM | POA: Insufficient documentation

## 2017-08-20 DIAGNOSIS — Z7982 Long term (current) use of aspirin: Secondary | ICD-10-CM | POA: Diagnosis not present

## 2017-08-20 NOTE — Progress Notes (Signed)
Cardiac Individual Treatment Plan  Patient Details  [name] MRN: [id number] Date of Birth: [birth year] Referring Provider:     Cardiac Rehab from 08/20/2017 in [facility] Cardiac and Pulmonary Rehab  Referring Provider  [name] MD      Initial Encounter Date:    Cardiac Rehab from 08/20/2017 in [facility] Cardiac and Pulmonary Rehab  Date  08/20/17  Referring Provider  [name] MD      Visit Diagnosis: Status post coronary artery stent placement  Patient's Home Medications on Admission:  Current Outpatient Medications:  .  aspirin EC 81 MG tablet, Take 81 mg by mouth daily., Disp: , Rfl:  .  atorvastatin (LIPITOR) 40 MG tablet, Take 1 tablet (40 mg total) by mouth daily at 6 PM., Disp: 30 tablet, Rfl: 6 .  clopidogrel (PLAVIX) 75 MG tablet, Take 1 tablet (75 mg total) by mouth daily with breakfast., Disp: 30 tablet, Rfl: 6 .  Coenzyme Q10 (CO Q 10) 100 MG CAPS, Take 100 mg by mouth daily., Disp: , Rfl:  .  glimepiride (AMARYL) 2 MG tablet, Take 2 mg by mouth daily., Disp: , Rfl:  .  leflunomide (ARAVA) 10 MG tablet, Take 1 tablet by mouth daily., Disp: , Rfl:  .  lisinopril (PRINIVIL,ZESTRIL) 10 MG tablet, Take 1 tablet (10 mg total) by mouth daily., Disp: 90 tablet, Rfl: 3 .  metFORMIN (GLUCOPHAGE) 500 MG tablet, Take 500 mg by mouth 2 (two) times daily., Disp: , Rfl:  .  mirabegron ER (MYRBETRIQ) 50 MG TB24 tablet, Take 1 tablet (50 mg total) by mouth daily., Disp: 30 tablet, Rfl: 11 .  nebivolol (BYSTOLIC) 5 MG tablet, Take 5 mg by mouth daily., Disp: , Rfl:  .  nitroGLYCERIN (NITROSTAT) 0.4 MG SL tablet, DISSOLVE 1 TABLET UNDER THE TONGUE EVERY 5 MINUTES AS NEEDED FOR CHEST PAIN., Disp: 75 tablet, Rfl: 1 .  Omega-3 Fatty Acids (OMEGA 3 PO), Take 1 tablet 2 (two) times daily by mouth., Disp: , Rfl:  .  thyroid (ARMOUR) 90 MG tablet, Take 90 mg by mouth every morning. , Disp: , Rfl:  .  Turmeric (CURCUMIN 95 PO), Take 1 tablet by mouth daily., Disp: , Rfl:  .  BLACK  PEPPER-TURMERIC PO, Take 1 capsule by mouth 2 (two) times daily., Disp: , Rfl:   Past Medical History: Past Medical History:  Diagnosis Date  . BPH (benign prostatic hypertrophy)   . CAD (coronary artery disease) 12/2015   Cath by Dr [name] reveals distal and small vessel CAD.  Medical therapy advised.  . CHF (congestive heart failure) ([place])   . 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 History of blood transfusion 1968   "probably; related to getting [place]  . History of kidney stones   . Hypertension   . Hypothyroidism   . OSA on CPAP   . 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 Rheumatoid arthritis ([place])    "hands" (08/11/2017)  . Type II diabetes mellitus (HCC)     Tobacco Use: Social History   Tobacco Use  Smoking Status Former Smoker  . Packs/day: 1.00  . Years: 5.00  . Pack years: 5.00  . Types: Cigarettes  . Last attempt to quit: 07/10/1972  . Years since quitting: 45.1  Smokeless Tobacco Former Systems developer  . Types: Chew  . Quit date: 1975    Labs: Recent Review [employer] manager for ITP Cardiac and Pulmonary Rehab Latest  Ref Rng & Units 08/11/2017 08/11/2017   HCO3 20.0 - 28.0 mmol/L 24.6 24.5   TCO2 22 - 32 mmol/L 26 26   ACIDBASEDEF 0.0 - 2.0 mmol/L 2.0 2.0   O2SAT % 62.0 63.0       Exercise Target Goals: Date: 08/20/17  Exercise Program Goal: Individual exercise prescription set using results from initial 6 min walk test and THRR while considering  patient's activity barriers and safety.   Exercise Prescription Goal: Initial exercise prescription builds to 30-45 minutes a day of aerobic activity, 2-3 days per week.  Home exercise guidelines will be given to patient during program as part of exercise prescription that the participant will acknowledge.  Activity Barriers & Risk Stratification: Activity Barriers & Cardiac Risk Stratification - 08/20/17 1317      Activity Barriers & Cardiac Risk Stratification   Activity  Barriers  Shortness of Breath;Deconditioning    Cardiac Risk Stratification  Moderate       6 Minute Walk: 6 Minute Walk    Row Name 08/20/17 1322         6 Minute Walk   Phase  Initial     Distance  1450 feet     Walk Time  6 minutes     # of Rest Breaks  0     MPH  2.75     METS  3.23     RPE  11     VO2 Peak  11.3     Symptoms  No     Resting HR  61 bpm     Resting BP  126/72     Resting Oxygen Saturation   100 %     Exercise Oxygen Saturation  during 6 min walk  100 %     Max Ex. HR  118 bpm     Max Ex. BP  174/84     2 Minute Post BP  124/70        Oxygen Initial Assessment:   Oxygen Re-Evaluation:   Oxygen Discharge (Final Oxygen Re-Evaluation):   Initial Exercise Prescription: Initial Exercise Prescription - 08/20/17 1300      Date of Initial Exercise RX and Referring Provider   Date  08/20/17    Referring Provider  [name] MD      Treadmill   MPH  2.7    Grade  0.5    Minutes  15    METs  3.25      Recumbant Bike   Level  2    RPM  50    Watts  33    Minutes  15    METs  3.2      T5 Nustep   Level  2    SPM  80    Minutes  15    METs  3.2      Prescription Details   Frequency (times per week)  2    Duration  Progress to 30 minutes of continuous aerobic without signs/symptoms of physical distress      Intensity   THRR 40-80% of Max Heartrate  94-127    Ratings of Perceived Exertion  11-13    Perceived Dyspnea  0-4      Progression   Progression  Continue to progress workloads to maintain intensity without signs/symptoms of physical distress.      Resistance Training   Training Prescription  Yes    Weight  4 lbs    Reps  10-15  Perform Capillary Blood Glucose checks as needed.  Exercise Prescription Changes: Exercise Prescription Changes    Row Name 08/20/17 1300             Response to Exercise   Blood Pressure (Admit)  126/72       Blood Pressure (Exercise)  174/84       Blood Pressure (Exit)  124/70        Heart Rate (Admit)  61 bpm       Heart Rate (Exercise)  118 bpm       Heart Rate (Exit)  69 bpm       Oxygen Saturation (Admit)  100 %       Oxygen Saturation (Exercise)  100 %       Rating of Perceived Exertion (Exercise)  11       Symptoms  none       Comments  walk test results          Exercise Comments:   Exercise Goals and Review: Exercise Goals    Row Name 08/20/17 1326             Exercise Goals   Increase Physical Activity  Yes       Intervention  Provide advice, education, support and counseling about physical activity/exercise needs.;Develop an individualized exercise prescription for aerobic and resistive training based on initial evaluation findings, risk stratification, comorbidities and participant's personal goals.       Expected Outcomes  Short Term: Attend rehab on a regular basis to increase amount of physical activity.;Long Term: Add in home exercise to make exercise part of routine and to increase amount of physical activity.;Long Term: Exercising regularly at least 3-5 days a week.       Increase Strength and Stamina  Yes       Intervention  Provide advice, education, support and counseling about physical activity/exercise needs.;Develop an individualized exercise prescription for aerobic and resistive training based on initial evaluation findings, risk stratification, comorbidities and participant's personal goals.       Expected Outcomes  Short Term: Increase workloads from initial exercise prescription for resistance, speed, and METs.;Short Term: Perform resistance training exercises routinely during rehab and add in resistance training at home;Long Term: Improve cardiorespiratory fitness, muscular endurance and strength as measured by increased METs and functional capacity (6MWT)       Able to understand and use rate of perceived exertion (RPE) scale  Yes       Intervention  Provide education and explanation on how to use RPE scale       Expected  Outcomes  Short Term: Able to use RPE daily in rehab to express subjective intensity level;Long Term:  Able to use RPE to guide intensity level when exercising independently       Able to understand and use Dyspnea scale  Yes       Intervention  Provide education and explanation on how to use Dyspnea scale       Expected Outcomes  Short Term: Able to use Dyspnea scale daily in rehab to express subjective sense of shortness of breath during exertion;Long Term: Able to use Dyspnea scale to guide intensity level when exercising independently       Knowledge and understanding of Target Heart Rate Range (THRR)  Yes       Intervention  Provide education and explanation of THRR including how the numbers were predicted and where they are located for reference       Expected  Outcomes  Short Term: Able to state/look up THRR;Long Term: Able to use THRR to govern intensity when exercising independently;Short Term: Able to use daily as guideline for intensity in rehab       Able to check pulse independently  Yes       Intervention  Provide education and demonstration on how to check pulse in carotid and radial arteries.;Review the importance of being able to check your own pulse for safety during independent exercise       Expected Outcomes  Short Term: Able to explain why pulse checking is important during independent exercise;Long Term: Able to check pulse independently and accurately       Understanding of Exercise Prescription  Yes       Intervention  Provide education, explanation, and written materials on patient's individual exercise prescription       Expected Outcomes  Short Term: Able to explain program exercise prescription;Long Term: Able to explain home exercise prescription to exercise independently          Exercise Goals Re-Evaluation :   Discharge Exercise Prescription (Final Exercise Prescription Changes): Exercise Prescription Changes - 08/20/17 1300      Response to Exercise   Blood  Pressure (Admit)  126/72    Blood Pressure (Exercise)  174/84    Blood Pressure (Exit)  124/70    Heart Rate (Admit)  61 bpm    Heart Rate (Exercise)  118 bpm    Heart Rate (Exit)  69 bpm    Oxygen Saturation (Admit)  100 %    Oxygen Saturation (Exercise)  100 %    Rating of Perceived Exertion (Exercise)  11    Symptoms  none    Comments  walk test results       Nutrition:  Target Goals: Understanding of nutrition guidelines, daily intake of sodium <1575m, cholesterol <2030m calories 30% from fat and 7% or less from saturated fats, daily to have 5 or more servings of fruits and vegetables.  Biometrics: Pre Biometrics - 08/20/17 1326      Pre Biometrics   Height  5' 7.1" (1.704 m)    Weight  187 lb 8 oz (85 kg)    Waist Circumference  39 inches    Hip Circumference  38 inches    Waist to Hip Ratio  1.03 %    BMI (Calculated)  29.29    Single Leg Stand  30 seconds        Nutrition Therapy Plan and Nutrition Goals: Nutrition Therapy & Goals - 08/20/17 1308      Intervention Plan   Intervention  Prescribe, educate and counsel regarding individualized specific dietary modifications aiming towards targeted core components such as weight, hypertension, lipid management, diabetes, heart failure and other comorbidities.;Nutrition handout(s) given to patient.    Expected Outcomes  Short Term Goal: Understand basic principles of dietary content, such as calories, fat, sodium, cholesterol and nutrients.;Short Term Goal: A plan has been developed with personal nutrition goals set during dietitian appointment.;Long Term Goal: Adherence to prescribed nutrition plan.       Nutrition Assessments: Nutrition Assessments - 08/20/17 1308      MEDFICTS Scores   Pre Score  -- [name] will bring his first day of class       Nutrition Goals Re-Evaluation:   Nutrition Goals Discharge (Final Nutrition Goals Re-Evaluation):   Psychosocial: Target Goals: Acknowledge presence or absence of  significant depression and/or stress, maximize coping skills, provide positive support system. Participant is able to  verbalize types and ability to use techniques and skills needed for reducing stress and depression.   Initial Review & Psychosocial Screening: Initial Psych Review & Screening - 08/20/17 1309      Initial Review   Current issues with  None Identified did not reveal any to staff during orientation      [place]?  Yes family      Screening Interventions   Interventions  Encouraged to exercise;Program counselor consult;To provide support and resources with identified psychosocial needs;Provide feedback about the scores to participant    Expected Outcomes  Short Term goal: Utilizing psychosocial counselor, staff and physician to assist with identification of specific Stressors or current issues interfering with healing process. Setting desired goal for each stressor or current issue identified.;Long Term Goal: Stressors or current issues are controlled or eliminated.;Short Term goal: Identification and review with participant of any Quality of Life or Depression concerns found by scoring the questionnaire.;Long Term goal: The participant improves quality of Life and PHQ9 Scores as seen by post scores and/or verbalization of changes       Quality of Life Scores:  Quality of Life - 08/20/17 1316      Quality of Life Scores   Health/Function Pre  27.27 %    Socioeconomic Pre  30 %    Psych/Spiritual Pre  30 %    Family Pre  30 %    GLOBAL Pre  28.83 %      Scores of 19 and below usually indicate a poorer quality of life in these areas.  A difference of  2-3 points is a clinically meaningful difference.  A difference of 2-3 points in the total score of the Quality of Life Index has been associated with significant improvement in overall quality of life, self-image, physical symptoms, and general health in studies assessing change in quality of  life.  PHQ-9: Recent Review Flowsheet Data    Depression screen [facility] 2/9 08/20/2017   Decreased Interest 0   Down, Depressed, Hopeless 0   PHQ - 2 Score 0   Altered sleeping 0   Tired, decreased energy 0   Change in appetite 0   Feeling bad or failure about yourself  0   Trouble concentrating 0   Moving slowly or fidgety/restless 0   Suicidal thoughts 0   PHQ-9 Score 0     Interpretation of Total Score  Total Score Depression Severity:  1-4 = Minimal depression, 5-9 = Mild depression, 10-14 = Moderate depression, 15-19 = Moderately severe depression, 20-27 = Severe depression   Psychosocial Evaluation and Intervention:   Psychosocial Re-Evaluation:   Psychosocial Discharge (Final Psychosocial Re-Evaluation):   Vocational Rehabilitation: Provide vocational rehab assistance to qualifying candidates.   Vocational Rehab Evaluation & Intervention: Vocational Rehab - 08/20/17 1317      Initial Vocational Rehab Evaluation & Intervention   Assessment shows need for Vocational Rehabilitation  No       Education: Education Goals: Education classes will be provided on a variety of topics geared toward better understanding of heart health and risk factor modification. Participant will state understanding/return demonstration of topics presented as noted by education test scores.  Learning Barriers/Preferences: Learning Barriers/Preferences - 08/20/17 1316      Learning Barriers/Preferences   Learning Barriers  None    Learning Preferences  None       Education Topics:  AED/CPR: - Group verbal and written instruction with the use of models to demonstrate the basic  use of the AED with the basic ABC's of resuscitation.   General Nutrition Guidelines/Fats and Fiber: -Group instruction provided by verbal, written material, models and posters to present the general guidelines for heart healthy nutrition. Gives an explanation and review of dietary fats and  fiber.   Controlling Sodium/Reading Food Labels: -Group verbal and written material supporting the discussion of sodium use in heart healthy nutrition. Review and explanation with models, verbal and written materials for utilization of the food label.   Exercise Physiology & General Exercise Guidelines: - Group verbal and written instruction with models to review the exercise physiology of the cardiovascular system and associated critical values. Provides general exercise guidelines with specific guidelines to those with heart or lung disease.    Aerobic Exercise & Resistance Training: - Gives group verbal and written instruction on the various components of exercise. Focuses on aerobic and resistive training programs and the benefits of this training and how to safely progress through these programs..   Flexibility, Balance, Mind/Body Relaxation: Provides group verbal/written instruction on the benefits of flexibility and balance training, including mind/body exercise modes such as yoga, pilates and tai chi.  Demonstration and skill practice provided.   Stress and Anxiety: - Provides group verbal and written instruction about the health risks of elevated stress and causes of high stress.  Discuss the correlation between heart/lung disease and anxiety and treatment options. Review healthy ways to manage with stress and anxiety.   Depression: - Provides group verbal and written instruction on the correlation between heart/lung disease and depressed mood, treatment options, and the stigmas associated with seeking treatment.   Anatomy & Physiology of the Heart: - Group verbal and written instruction and models provide basic cardiac anatomy and physiology, with the coronary electrical and arterial systems. Review of Valvular disease and Heart Failure   Cardiac Procedures: - Group verbal and written instruction to review commonly prescribed medications for heart disease. Reviews the  medication, class of the drug, and side effects. Includes the steps to properly store meds and maintain the prescription regimen. (beta blockers and nitrates)   Cardiac Medications I: - Group verbal and written instruction to review commonly prescribed medications for heart disease. Reviews the medication, class of the drug, and side effects. Includes the steps to properly store meds and maintain the prescription regimen.   Cardiac Medications II: -Group verbal and written instruction to review commonly prescribed medications for heart disease. Reviews the medication, class of the drug, and side effects. (all other drug classes)    Go Sex-Intimacy & Heart Disease, Get SMART - Goal Setting: - Group verbal and written instruction through game format to discuss heart disease and the return to sexual intimacy. Provides group verbal and written material to discuss and apply goal setting through the application of the S.M.A.R.T. Method.   Other Matters of the Heart: - Provides group verbal, written materials and models to describe Stable Angina and Peripheral Artery. Includes description of the disease process and treatment options available to the cardiac patient.   Exercise & Equipment Safety: - Individual verbal instruction and demonstration of equipment use and safety with use of the equipment.   Cardiac Rehab from 08/20/2017 in [facility] Cardiac and Pulmonary Rehab  Date  08/20/17  Educator  [facility]  Instruction Review Code  1- Verbalizes Understanding      Infection Prevention: - Provides verbal and written material to individual with discussion of infection control including proper hand washing and proper equipment cleaning during exercise session.  Cardiac Rehab from 08/20/2017 in [facility] Cardiac and Pulmonary Rehab  Date  08/20/17  Educator  [facility]  Instruction Review Code  1- Verbalizes Understanding      Falls Prevention: - Provides verbal and written material to individual with discussion  of falls prevention and safety.   Cardiac Rehab from 08/20/2017 in [facility] Cardiac and Pulmonary Rehab  Date  08/20/17  Educator  [facility]  Instruction Review Code  1- Verbalizes Understanding      Diabetes: - Individual verbal and written instruction to review signs/symptoms of diabetes, desired ranges of glucose level fasting, after meals and with exercise. Acknowledge that pre and post exercise glucose checks will be done for 3 sessions at entry of program.   Cardiac Rehab from 08/20/2017 in [facility] Cardiac and Pulmonary Rehab  Date  08/20/17  Educator  [facility]  Instruction Review Code  1- Verbalizes Understanding      Know Your Numbers and Risk Factors: -Group verbal and written instruction about important numbers in your health.  Discussion of what are risk factors and how they play a role in the disease process.  Review of Cholesterol, Blood Pressure, Diabetes, and BMI and the role they play in your overall health.   Sleep Hygiene: -Provides group verbal and written instruction about how sleep can affect your health.  Define sleep hygiene, discuss sleep cycles and impact of sleep habits. Review good sleep hygiene tips.    Other: -Provides group and verbal instruction on various topics (see comments)   Knowledge Questionnaire Score: Knowledge Questionnaire Score - 08/20/17 1316      Knowledge Questionnaire Score   Pre Score  26/28 correct answers reviewed with [name]       Core Components/Risk Factors/Patient Goals at Admission: Personal Goals and Risk Factors at Admission - 08/20/17 1305      Core Components/Risk Factors/Patient Goals on Admission    Weight Management  Yes;Weight Maintenance    Intervention  Weight Management: Develop a combined nutrition and exercise program designed to reach desired caloric intake, while maintaining appropriate intake of nutrient and fiber, sodium and fats, and appropriate energy expenditure required for the weight goal.;Weight Management: Provide  education and appropriate resources to help participant work on and attain dietary goals.    Admit Weight  187 lb (84.8 kg)    Expected Outcomes  Short Term: Continue to assess and modify interventions until short term weight is achieved;Long Term: Adherence to nutrition and physical activity/exercise program aimed toward attainment of established weight goal;Weight Maintenance: Understanding of the daily nutrition guidelines, which includes 25-35% calories from fat, 7% or less cal from saturated fats, less than 262m cholesterol, less than 1.5gm of sodium, & 5 or more servings of fruits and vegetables daily;Understanding recommendations for meals to include 15-35% energy as protein, 25-35% energy from fat, 35-60% energy from carbohydrates, less than 2062mof dietary cholesterol, 20-35 gm of total fiber daily;Understanding of distribution of calorie intake throughout the day with the consumption of 4-5 meals/snacks    Diabetes  Yes    Intervention  Provide education about signs/symptoms and action to take for hypo/hyperglycemia.;Provide education about proper nutrition, including hydration, and aerobic/resistive exercise prescription along with prescribed medications to achieve blood glucose in normal ranges: Fasting glucose 65-99 mg/dL    Expected Outcomes  Short Term: Participant verbalizes understanding of the signs/symptoms and immediate care of hyper/hypoglycemia, proper foot care and importance of medication, aerobic/resistive exercise and nutrition plan for blood glucose control.;Long Term: Attainment of HbA1C < 7%.  Hypertension  Yes    Intervention  Provide education on lifestyle modifcations including regular physical activity/exercise, weight management, moderate sodium restriction and increased consumption of fresh fruit, vegetables, and low fat dairy, alcohol moderation, and smoking cessation.;Monitor prescription use compliance.    Expected Outcomes  Short Term: Continued assessment and  intervention until BP is < 140/30m HG in hypertensive participants. < 130/852mHG in hypertensive participants with diabetes, heart failure or chronic kidney disease.;Long Term: Maintenance of blood pressure at goal levels.       Core Components/Risk Factors/Patient Goals Review:    Core Components/Risk Factors/Patient Goals at Discharge (Final Review):    ITP Comments: ITP Comments    Row Name 08/20/17 1303           ITP Comments  Med Review completed. Initial ITP created. [facility]          Comments: Initial ITP

## 2017-08-20 NOTE — Patient Instructions (Signed)
Patient Instructions  Patient Details  [name] MRN: [id number] Date of Birth: 09-May-1941 Referring Provider:  Larey Dresser, MD  Below are your personal goals for exercise, nutrition, and risk factors. Our goal is to help you stay on track towards obtaining and maintaining these goals. We will be discussing your progress on these goals with you throughout the program.  Initial Exercise Prescription: Initial Exercise Prescription - 08/20/17 1300      Date of Initial Exercise RX and Referring Provider   Date  08/20/17    Referring Provider  [name] MD      Treadmill   MPH  2.7    Grade  0.5    Minutes  15    METs  3.25      Recumbant Bike   Level  2    RPM  50    Watts  33    Minutes  15    METs  3.2      T5 Nustep   Level  2    SPM  80    Minutes  15    METs  3.2      Prescription Details   Frequency (times per week)  2    Duration  Progress to 30 minutes of continuous aerobic without signs/symptoms of physical distress      Intensity   THRR 40-80% of Max Heartrate  94-127    Ratings of Perceived Exertion  11-13    Perceived Dyspnea  0-4      Progression   Progression  Continue to progress workloads to maintain intensity without signs/symptoms of physical distress.      Resistance Training   Training Prescription  Yes    Weight  4 lbs    Reps  10-15       Exercise Goals: Frequency: Be able to perform aerobic exercise two to three times per week in program working toward 2-5 days per week of home exercise.  Intensity: Work with a perceived exertion of 11 (fairly light) - 15 (hard) while following your exercise prescription.  We will make changes to your prescription with you as you progress through the program.   Duration: Be able to do 30 to 45 minutes of continuous aerobic exercise in addition to a 5 minute warm-up and a 5 minute cool-down routine.   Nutrition Goals: Your personal nutrition goals will be established when you do your  nutrition analysis with the dietician.  The following are general nutrition guidelines to follow: Cholesterol < 219m/day Sodium < 1501mday Fiber: Men over 50 yrs - 30 grams per day  Personal Goals: Personal Goals and Risk Factors at Admission - 08/20/17 1305      Core Components/Risk Factors/Patient Goals on Admission    Weight Management  Yes;Weight Maintenance    Intervention  Weight Management: Develop a combined nutrition and exercise program designed to reach desired caloric intake, while maintaining appropriate intake of nutrient and fiber, sodium and fats, and appropriate energy expenditure required for the weight goal.;Weight Management: Provide education and appropriate resources to help participant work on and attain dietary goals.    Admit Weight  187 lb (84.8 kg)    Expected Outcomes  Short Term: Continue to assess and modify interventions until short term weight is achieved;Long Term: Adherence to nutrition and physical activity/exercise program aimed toward attainment of established weight goal;Weight Maintenance: Understanding of the daily nutrition guidelines, which includes 25-35% calories from fat, 7% or less cal from saturated fats,  less than 25m cholesterol, less than 1.5gm of sodium, & 5 or more servings of fruits and vegetables daily;Understanding recommendations for meals to include 15-35% energy as protein, 25-35% energy from fat, 35-60% energy from carbohydrates, less than 2054mof dietary cholesterol, 20-35 gm of total fiber daily;Understanding of distribution of calorie intake throughout the day with the consumption of 4-5 meals/snacks    Diabetes  Yes    Intervention  Provide education about signs/symptoms and action to take for hypo/hyperglycemia.;Provide education about proper nutrition, including hydration, and aerobic/resistive exercise prescription along with prescribed medications to achieve blood glucose in normal ranges: Fasting glucose 65-99 mg/dL     Expected Outcomes  Short Term: Participant verbalizes understanding of the signs/symptoms and immediate care of hyper/hypoglycemia, proper foot care and importance of medication, aerobic/resistive exercise and nutrition plan for blood glucose control.;Long Term: Attainment of HbA1C < 7%.    Hypertension  Yes    Intervention  Provide education on lifestyle modifcations including regular physical activity/exercise, weight management, moderate sodium restriction and increased consumption of fresh fruit, vegetables, and low fat dairy, alcohol moderation, and smoking cessation.;Monitor prescription use compliance.    Expected Outcomes  Short Term: Continued assessment and intervention until BP is < 140/9022mG in hypertensive participants. < 130/20m72m in hypertensive participants with diabetes, heart failure or chronic kidney disease.;Long Term: Maintenance of blood pressure at goal levels.       Tobacco Use Initial Evaluation: Social History   Tobacco Use  Smoking Status Former Smoker  . Packs/day: 1.00  . Years: 5.00  . Pack years: 5.00  . Types: Cigarettes  . Last attempt to quit: 07/10/1972  . Years since quitting: 45.1  Smokeless Tobacco Former UserSystems developerTypes: Chew  . Quit date: 1975    Exercise Goals and Review: Exercise Goals    Row Name 08/20/17 1326             Exercise Goals   Increase Physical Activity  Yes       Intervention  Provide advice, education, support and counseling about physical activity/exercise needs.;Develop an individualized exercise prescription for aerobic and resistive training based on initial evaluation findings, risk stratification, comorbidities and participant's personal goals.       Expected Outcomes  Short Term: Attend rehab on a regular basis to increase amount of physical activity.;Long Term: Add in home exercise to make exercise part of routine and to increase amount of physical activity.;Long Term: Exercising regularly at least 3-5 days a week.        Increase Strength and Stamina  Yes       Intervention  Provide advice, education, support and counseling about physical activity/exercise needs.;Develop an individualized exercise prescription for aerobic and resistive training based on initial evaluation findings, risk stratification, comorbidities and participant's personal goals.       Expected Outcomes  Short Term: Increase workloads from initial exercise prescription for resistance, speed, and METs.;Short Term: Perform resistance training exercises routinely during rehab and add in resistance training at home;Long Term: Improve cardiorespiratory fitness, muscular endurance and strength as measured by increased METs and functional capacity (6MWT)       Able to understand and use rate of perceived exertion (RPE) scale  Yes       Intervention  Provide education and explanation on how to use RPE scale       Expected Outcomes  Short Term: Able to use RPE daily in rehab to express subjective intensity level;Long Term:  Able to use RPE  to guide intensity level when exercising independently       Able to understand and use Dyspnea scale  Yes       Intervention  Provide education and explanation on how to use Dyspnea scale       Expected Outcomes  Short Term: Able to use Dyspnea scale daily in rehab to express subjective sense of shortness of breath during exertion;Long Term: Able to use Dyspnea scale to guide intensity level when exercising independently       Knowledge and understanding of Target Heart Rate Range (THRR)  Yes       Intervention  Provide education and explanation of THRR including how the numbers were predicted and where they are located for reference       Expected Outcomes  Short Term: Able to state/look up THRR;Long Term: Able to use THRR to govern intensity when exercising independently;Short Term: Able to use daily as guideline for intensity in rehab       Able to check pulse independently  Yes       Intervention  Provide education  and demonstration on how to check pulse in carotid and radial arteries.;Review the importance of being able to check your own pulse for safety during independent exercise       Expected Outcomes  Short Term: Able to explain why pulse checking is important during independent exercise;Long Term: Able to check pulse independently and accurately       Understanding of Exercise Prescription  Yes       Intervention  Provide education, explanation, and written materials on patient's individual exercise prescription       Expected Outcomes  Short Term: Able to explain program exercise prescription;Long Term: Able to explain home exercise prescription to exercise independently          Copy of goals given to participant.

## 2017-08-25 ENCOUNTER — Telehealth: Payer: Self-pay | Admitting: Internal Medicine

## 2017-08-25 NOTE — Telephone Encounter (Signed)
Spoke with patient's wife in regards to a trip to [place] in September after having recent intervention (2 weeks)..  I informed her that she would need to talk to Dr Claiborne Billings (interventionalist) about those arrangements..  She verbalized understanding.Marland KitchenMarland Kitchen

## 2017-08-25 NOTE — Telephone Encounter (Signed)
New message:    Pt's wife is calling to see if he okay to travel to [place]. Pt's wife states pt has an appt on 4/8 but pt's wife would like to have an answer before Monday.

## 2017-08-25 NOTE — Telephone Encounter (Signed)
Please say, "thanks and I am humbled by the feeback. And Appreciate it"  Best  MR

## 2017-08-25 NOTE — Telephone Encounter (Signed)
Called and spoke with patients wife, she wanted to let MR know that they are very appreciative for what he did for Mr. Pardee. Patient had two blockages. Patient had two stents placed.   Routing this to MR for FYI.

## 2017-08-26 ENCOUNTER — Ambulatory Visit: Payer: Medicare Other | Admitting: Adult Health

## 2017-08-26 ENCOUNTER — Encounter: Payer: Self-pay | Admitting: Adult Health

## 2017-08-26 ENCOUNTER — Ambulatory Visit (INDEPENDENT_AMBULATORY_CARE_PROVIDER_SITE_OTHER): Payer: Medicare Other | Admitting: Adult Health

## 2017-08-26 VITALS — BP 130/58 | HR 60 | Ht 67.0 in | Wt 185.6 lb

## 2017-08-26 DIAGNOSIS — I480 Paroxysmal atrial fibrillation: Secondary | ICD-10-CM

## 2017-08-26 DIAGNOSIS — I2510 Atherosclerotic heart disease of native coronary artery without angina pectoris: Secondary | ICD-10-CM | POA: Diagnosis not present

## 2017-08-26 DIAGNOSIS — I10 Essential (primary) hypertension: Secondary | ICD-10-CM

## 2017-08-26 DIAGNOSIS — E7800 Pure hypercholesterolemia, unspecified: Secondary | ICD-10-CM

## 2017-08-26 DIAGNOSIS — I25119 Atherosclerotic heart disease of native coronary artery with unspecified angina pectoris: Secondary | ICD-10-CM

## 2017-08-26 MED ORDER — ATORVASTATIN CALCIUM 40 MG PO TABS: 40.0000 mg | ORAL_TABLET | Freq: Every day | ORAL | 6 refills | Status: DC

## 2017-08-26 MED ORDER — LISINOPRIL 10 MG PO TABS: 10.0000 mg | ORAL_TABLET | Freq: Every day | ORAL | 3 refills | Status: DC

## 2017-08-26 MED ORDER — NEBIVOLOL HCL 5 MG PO TABS: 5.0000 mg | ORAL_TABLET | Freq: Every day | ORAL | 6 refills | Status: DC

## 2017-08-26 MED ORDER — CLOPIDOGREL BISULFATE 75 MG PO TABS: 75.0000 mg | ORAL_TABLET | Freq: Every day | ORAL | 6 refills | Status: DC

## 2017-08-26 NOTE — Telephone Encounter (Signed)
Called and left voicemail for Vickie, patient's wife, with the messaged that MR wanted to send.  Nothing else needed at this time.

## 2017-08-26 NOTE — Progress Notes (Signed)
Cardiology Office Note   Date:  08/26/2017   ID:  Cameron [name], DOB July 30, 1940, MRN [id number]  PCP:  [name], MD  Cardiologist:  Aundra Dubin Electrophysiologist: Allred  Chief Complaint  Patient presents with  . Follow-up     History of Present Illness: Cameron [name] is a 77 y.o. male who presents for post hospitalization after admission for chest pain and dyspnea.  He ultimately had cardiac cath on 08/11/2017 revealing disease in the prox Cx and prox OM1, and prox D3.  PCI to the LCX  She was to continue DAPT for one year Other history of CHB with PPM (STJ dual chamber 2013).  in situ, Hypertension, Diabetes Type II.   He comes today feeling very well. He has no further dyspnea or chest discomfort. He is back walking daily. He is due to start cardiac rehab. He goes to a senior exercise class where he walks a mile 3 times a week and 1/2 mile on the other days. He does some mild stretching exercises as well.  He is pleased with how he feels since stent placement. He apparently had some LEE which has since resolved since cardiac stent placement.   He has multiple questions concerning the placement of the stents and medications he is taking.  He has been medically compliant.   Past Medical History:  Diagnosis Date  . BPH (benign prostatic hypertrophy)   . CAD (coronary artery disease) 12/2015   Cath by Dr [name] reveals distal and small vessel CAD.  Medical therapy advised.  . CHF (congestive heart failure) ([place])   . 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 History of blood transfusion 1968   "probably; related to getting [place]  . History of kidney stones   . Hypertension   . Hypothyroidism   . OSA on CPAP   . 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 Rheumatoid arthritis ([place])    "hands" (08/11/2017)  . Type II diabetes mellitus ([place])     Past Surgical History:  Procedure Laterality Date  . BACK SURGERY    . CARDIAC CATHETERIZATION N/A  12/28/2015   Procedure: Left Heart Cath and Coronary Angiography;  Surgeon: Belva Crome, MD;  Location: [facility];  Service: Cardiovascular;  Laterality: N/A;  . CATARACT EXTRACTION W/ INTRAOCULAR LENS  IMPLANT, BILATERAL Bilateral   . CORONARY ANGIOPLASTY WITH STENT PLACEMENT  08/11/2017   "2 stents"  . CORONARY STENT INTERVENTION N/A 08/11/2017   Procedure: CORONARY STENT INTERVENTION;  Surgeon: Troy Sine, MD;  Location: [facility];  Service: Cardiovascular;  Laterality: N/A;  . CYSTOSCOPY W/ STONE MANIPULATION    . INGUINAL HERNIA REPAIR Left   . INSERT / REPLACE / REMOVE PACEMAKER  12/09/2011   SJM Accent DR RF implanted by DR Allred for complete heart block and syncope  . JOINT REPLACEMENT    . LAPAROSCOPIC CHOLECYSTECTOMY    . LITHOTRIPSY    . LUMBAR DISC SURGERY     "removed arthritis and spurs"  . PERMANENT PACEMAKER INSERTION N/A 12/09/2011   Procedure: PERMANENT PACEMAKER INSERTION;  Surgeon: [name], MD;  Location: [facility] C[facility];  Service: Cardiovascular;  Laterality: N/A;  . REPLACEMENT TOTAL KNEE Right   . RIGHT/LEFT HEART CATH AND CORONARY ANGIOGRAPHY N/A 08/11/2017   Procedure: RIGHT/LEFT HEART CATH AND CORONARY ANGIOGRAPHY;  Surgeon: Larey Dresser, MD;  Location: [facility];  Service: Cardiovascular;  Laterality: N/A;  .  TRANSURETHRAL RESECTION OF PROSTATE  2017/2018     Current Outpatient Medications  Medication Sig Dispense Refill  . aspirin EC 81 MG tablet Take 81 mg by mouth daily.    Marland Kitchen atorvastatin (LIPITOR) 40 MG tablet Take 1 tablet (40 mg total) by mouth daily at 6 PM. 30 tablet 6  . BLACK PEPPER-TURMERIC PO Take 1 capsule by mouth 2 (two) times daily.    . clopidogrel (PLAVIX) 75 MG tablet Take 1 tablet (75 mg total) by mouth daily with breakfast. 30 tablet 6  . Coenzyme Q10 (CO Q 10) 100 MG CAPS Take 100 mg by mouth daily.    Marland Kitchen glimepiride (AMARYL) 2 MG tablet Take 2 mg by mouth daily.    Marland Kitchen leflunomide (ARAVA) 10 MG tablet  Take 1 tablet by mouth daily.    Marland Kitchen lisinopril (PRINIVIL,ZESTRIL) 10 MG tablet Take 1 tablet (10 mg total) by mouth daily. 90 tablet 3  . metFORMIN (GLUCOPHAGE) 500 MG tablet Take 500 mg by mouth 2 (two) times daily.    . mirabegron ER (MYRBETRIQ) 50 MG TB24 tablet Take 1 tablet (50 mg total) by mouth daily. 30 tablet 11  . nebivolol (BYSTOLIC) 5 MG tablet Take 1 tablet (5 mg total) by mouth daily. 30 tablet 6  . nitroGLYCERIN (NITROSTAT) 0.4 MG SL tablet DISSOLVE 1 TABLET UNDER THE TONGUE EVERY 5 MINUTES AS NEEDED FOR CHEST PAIN. 75 tablet 1  . Omega-3 Fatty Acids (OMEGA 3 PO) Take 1 tablet 2 (two) times daily by mouth.    . Red Yeast Rice Extract (RED YEAST RICE PO) Take 1 capsule by mouth daily.    Marland Kitchen thyroid (ARMOUR) 90 MG tablet Take 90 mg by mouth every morning.     . TURMERIC CURCUMIN PO Take 1 capsule by mouth daily.     No current facility-administered medications for this visit.     Allergies:   Celecoxib    Social History:  The patient  reports that he quit smoking about 45 years ago. His smoking use included cigarettes. He has a 5.00 pack-year smoking history. He quit smokeless tobacco use about 44 years ago. His smokeless tobacco use included chew. He reports that he drinks about 0.6 oz of alcohol per week. He reports that he does not use drugs.   Family History:  The patient's family history includes Alzheimer's disease in his paternal grandmother and sister; Arthritis in his maternal grandfather; Bone cancer in his father; Heart attack in his paternal uncle; Lung cancer in his paternal grandfather; Pancreatic cancer in his mother.    ROS: All other systems are reviewed and negative. Unless otherwise mentioned in H&P    PHYSICAL EXAM: VS:  BP (!) 130/58   Pulse 60   Ht _0  (1.702 m)   Wt 185 lb 9.6 oz (84.2 kg)   BMI 29.07 kg/m  , BMI Body mass index is 29.07 kg/m. GEN: Well nourished, well developed, in no acute distress  HEENT: normal  Neck: no JVD, carotid bruits,  or masses Cardiac: RRR; no murmurs, rubs, or gallops,no edema  Respiratory:  clear to auscultation bilaterally, normal work of breathing GI: soft, nontender, nondistended, + BS MS: no deformity or atrophy right arm cath incision site is well healed  Skin: warm and dry, no rash Neuro:  Strength and sensation are intact Psych: euthymic mood, full affect   EKG: AV pacing, rate of 60 bpm.   Recent Labs: 09/13/2016: TSH 0.174 04/02/2017: NT-Pro BNP 557 08/12/2017: BUN 21; Creatinine, Ser  0.99; Hemoglobin 11.5; Platelets 176; Potassium 4.3; Sodium 138    Lipid Panel No results found for: CHOL, TRIG, HDL, CHOLHDL, VLDL, LDLCALC, LDLDIRECT    Wt Readings from Last 3 Encounters:  08/26/17 185 lb 9.6 oz (84.2 kg)  08/20/17 187 lb 8 oz (85 kg)  08/12/17 182 lb 15.7 oz (83 kg)      Other studies Reviewed:  08/11/2017 Cardiac Cath    Difficult but successful bifurcation stenting of a dominant left circumflex vessel with ultimate insertion of a 2.515 mm  Resolute DES stent into the OM1 one-vessel from its ostium and a 3.018 mm Resolute DES stent inserted extending from the proximal circumflex into the mid AV groove circumflex beyond the OM1 vessel.  The 80% circumflex marginal stenosis was reduced to 0% in the 90% AV groove circumflex stenosis was reduced to 0%.  RECOMMENDATION: DAPT therapy for minimum of 1 year.  Medical therapy for concomitant CAD as noted in Dr. Claris Gladden report.   Cardiac Cath 08/11/2017  Conclusion   1. Normal left and right heart filling pressures.  2. 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  Would not treat with selective pulmonary vasodilators.  3. Severe disease in the proximal LCx, proximal OM1, and proximal D3.  I suspect that his dyspnea comes from a combination of parenchymal lung disease and coronary disease.  We will plan to intervene on proximal LCx and proximal OM1 today.  Can return to proximal D3 in the future  if he remains symptomatic.       ASSESSMENT AND PLAN:  1. CAD: S/P cardiac cath on 08/11/2017 revealing disease in the prox Cx and prox OM1, and prox D3.  PCI to the LCX  I have reviewed this thoroughly with the patient and answered multiple questions. He verbalized understanding.  We will continue current regimen of DAPT, BB, ACE and statin therapy. He will follow up with Dr.Allred as scheduled. He is encouraged to attend cardiac rehab.   2. PPM: He is followed by EP for interrogations and ongoing management of pacemaker.   3. Hypertension: Well controlled on current medication regimen. No changes at this time.    Current medicines are reviewed at length with the patient today.    Labs/ tests ordered today include: None Phill Myron. [place] Pugh, ANP, AACC   08/26/2017 4:22 PM    [facility] [address], [place], [place] [address] Phone: [phone]; Fax: [phone]

## 2017-08-26 NOTE — Patient Instructions (Signed)
Medication Instructions:  NO CHANGES- Your physician recommends that you continue on your current medications as directed. Please refer to the Current Medication list given to you today.  If you need a refill on your cardiac medications before your next appointment, please call your pharmacy.  Follow-Up: Your physician wants you to follow-up in: KEEP SCHEDULED DR ALLRED APPT.  Thank you for choosing [facility] at [facility]!!

## 2017-08-27 ENCOUNTER — Encounter: Payer: Medicare Other | Attending: Cardiology | Admitting: *Deleted

## 2017-08-27 DIAGNOSIS — I509 Heart failure, unspecified: Secondary | ICD-10-CM | POA: Diagnosis not present

## 2017-08-27 DIAGNOSIS — E039 Hypothyroidism, unspecified: Secondary | ICD-10-CM | POA: Insufficient documentation

## 2017-08-27 DIAGNOSIS — Z7982 Long term (current) use of aspirin: Secondary | ICD-10-CM | POA: Diagnosis not present

## 2017-08-27 DIAGNOSIS — Z7984 Long term (current) use of oral hypoglycemic drugs: Secondary | ICD-10-CM | POA: Diagnosis not present

## 2017-08-27 DIAGNOSIS — I110 Hypertensive heart disease with heart failure: Secondary | ICD-10-CM | POA: Diagnosis not present

## 2017-08-27 DIAGNOSIS — Z79899 Other long term (current) drug therapy: Secondary | ICD-10-CM | POA: Insufficient documentation

## 2017-08-27 DIAGNOSIS — Z955 Presence of coronary angioplasty implant and graft: Secondary | ICD-10-CM | POA: Diagnosis not present

## 2017-08-27 DIAGNOSIS — M069 Rheumatoid arthritis, unspecified: Secondary | ICD-10-CM | POA: Insufficient documentation

## 2017-08-27 DIAGNOSIS — E119 Type 2 diabetes mellitus without complications: Secondary | ICD-10-CM | POA: Insufficient documentation

## 2017-08-27 DIAGNOSIS — I2510 Atherosclerotic heart disease of native coronary artery without angina pectoris: Secondary | ICD-10-CM | POA: Diagnosis not present

## 2017-08-27 DIAGNOSIS — Z7902 Long term (current) use of antithrombotics/antiplatelets: Secondary | ICD-10-CM | POA: Insufficient documentation

## 2017-08-27 DIAGNOSIS — G4733 Obstructive sleep apnea (adult) (pediatric): Secondary | ICD-10-CM | POA: Insufficient documentation

## 2017-08-27 LAB — GLUCOSE, CAPILLARY
Glucose-Capillary: 98 mg/dL (ref 65–99)
Glucose-Capillary: 81 mg/dL (ref 65–99)

## 2017-08-27 NOTE — Progress Notes (Signed)
Daily Session Note  Patient Details  [name] MRN: [id number] Date of Birth: 06-May-1941 Referring Provider:     Cardiac Rehab from 08/20/2017 in [facility]  Referring Provider  [name] MD      Encounter Date: 08/27/2017  Check In: Session Check In - 08/27/17 0821      Check-In   Location  [facility]Cardiac [facility] Pul[facility] Re[facility]    Staff Present  Alberteen Sam, MA, RCEP, CCRP, Exercise Physiologist;Amanda Oletta Darter, BA, ACSM CEP, Exercise Physiologist;Carroll Enterkin, RN, BSN    Supervising physician immediately available to respond to emergencies  See telemetry face sheet for immediately available ER MD    Medication changes reported      No    Fall or balance concerns reported     No    Warm-up and Cool-down  Performed on first and last piece of equipment    Resistance Training Performed  Yes    VAD Patient?  No      Pain Assessment   Currently in Pain?  No/denies          Social History   Tobacco Use  Smoking Status Former Smoker  . Packs/day: 1.00  . Years: 5.00  . Pack years: 5.00  . Types: Cigarettes  . Last attempt to quit: 07/10/1972  . Years since quitting: 45.1  Smokeless Tobacco Former Systems developer  . Types: Chew  . Quit date: 1975    Goals Met:  Exercise tolerated well Personal goals reviewed No report of cardiac concerns or symptoms Strength training completed today  Goals Unmet:  Not Applicable  Comments: First full day of exercise!  Patient was oriented to gym and equipment including functions, settings, policies, and procedures.  Patient's individual exercise prescription and treatment plan were reviewed.  All starting workloads were established based on the results of the 6 minute walk test done at initial orientation visit.  The plan for exercise progression was also introduced and progression will be customized based on patient's performance and goals.    Dr. Emily Filbert is Medical Director for [facility].

## 2017-08-31 ENCOUNTER — Encounter: Payer: Self-pay | Admitting: Internal Medicine

## 2017-08-31 ENCOUNTER — Ambulatory Visit (INDEPENDENT_AMBULATORY_CARE_PROVIDER_SITE_OTHER): Payer: Medicare Other | Admitting: Internal Medicine

## 2017-08-31 VITALS — BP 130/50 | HR 92 | Ht 67.0 in | Wt 189.0 lb

## 2017-08-31 DIAGNOSIS — Z950 Presence of cardiac pacemaker: Secondary | ICD-10-CM

## 2017-08-31 DIAGNOSIS — I442 Atrioventricular block, complete: Secondary | ICD-10-CM | POA: Diagnosis not present

## 2017-08-31 DIAGNOSIS — I10 Essential (primary) hypertension: Secondary | ICD-10-CM

## 2017-08-31 DIAGNOSIS — I25119 Atherosclerotic heart disease of native coronary artery with unspecified angina pectoris: Secondary | ICD-10-CM

## 2017-08-31 LAB — CUP PACEART INCLINIC DEVICE CHECK
Battery Remaining Longevity: 56 mo
Battery Voltage: 2.87 V
Brady Statistic RA Percent Paced: 98 %
Brady Statistic RV Percent Paced: 99.94 %
Date Time Interrogation Session: 20190408144242
Implantable Lead Implant Date: 20130716
Implantable Lead Implant Date: 20130716
Implantable Lead Location: 753859
Implantable Lead Location: 753860
Implantable Lead Model: 1948
Implantable Pulse Generator Implant Date: 20130716
Lead Channel Impedance Value: 337.5 Ohm
Lead Channel Impedance Value: 662.5 Ohm
Lead Channel Pacing Threshold Amplitude: 0.5 V
Lead Channel Pacing Threshold Amplitude: 0.75 V
Lead Channel Pacing Threshold Pulse Width: 0.5 ms
Lead Channel Pacing Threshold Pulse Width: 0.5 ms
Lead Channel Sensing Intrinsic Amplitude: 1.4 mV
Lead Channel Sensing Intrinsic Amplitude: 12 mV
Lead Channel Setting Pacing Amplitude: 1 V
Lead Channel Setting Pacing Amplitude: 1.5 V
Lead Channel Setting Pacing Pulse Width: 0.5 ms
Lead Channel Setting Sensing Sensitivity: 4 mV
Pulse Gen Model: 2210
Pulse Gen Serial Number: 7356500

## 2017-08-31 NOTE — Progress Notes (Signed)
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 male who presents today for routine electrophysiology followup.  Since last being seen in our clinic, the patient reports doing very well.  His cath revealed CAD and he underwent PCI of OM1 with DES and LCx.  SOB is improved.  He continues to have some fatigue.  Today, he denies symptoms of palpitations, chest pain,  lower extremity edema, dizziness, presyncope, or syncope.  The patient is otherwise without complaint today.   Past Medical History:  Diagnosis Date  . BPH (benign prostatic hypertrophy)   . CAD (coronary artery disease) 12/2015   Cath by Dr [name] reveals distal and small vessel CAD.  Medical therapy advised.  . CHF (congestive heart failure) ([place])   . 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 History of blood transfusion 1968   "probably; related [place] getting [place]  . History of kidney stones   . Hypertension   . Hypothyroidism   . OSA on CPAP   . 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 Rheumatoid arthritis ([place])    "hands" (08/11/2017)  . Type II diabetes mellitus ([place])    Past Surgical History:  Procedure Laterality Date  . BACK SURGERY    . CARDIAC CATHETERIZATION N/A 12/28/2015   Procedure: Left Heart Cath and Coronary Angiography;  Surgeon: Belva Crome, MD;  Location: [facility];  Service: Cardiovascular;  Laterality: N/A;  . CATARACT EXTRACTION W/ INTRAOCULAR LENS  IMPLANT, BILATERAL Bilateral   . CORONARY ANGIOPLASTY WITH STENT PLACEMENT  08/11/2017   "2 stents"  . CORONARY STENT INTERVENTION N/A 08/11/2017   Procedure: CORONARY STENT INTERVENTION;  Surgeon: Troy Sine, MD;  Location: [facility];  Service: Cardiovascular;  Laterality: N/A;  . CYSTOSCOPY W/ STONE MANIPULATION    . INGUINAL HERNIA REPAIR Left   . INSERT / REPLACE / REMOVE PACEMAKER  12/09/2011   SJM Accent DR RF implanted by DR Olive Zmuda  for complete heart block and syncope  . JOINT REPLACEMENT    . LAPAROSCOPIC CHOLECYSTECTOMY    . LITHOTRIPSY    . LUMBAR DISC SURGERY     "removed arthritis and spurs"  . PERMANENT PACEMAKER INSERTION N/A 12/09/2011   Procedure: PERMANENT PACEMAKER INSERTION;  Surgeon: [name], MD;  Location: [facility] C[facility] LAB;  Service: Cardiovascular;  Laterality: N/A;  . REPLACEMENT TOTAL KNEE Right   . RIGHT/LEFT HEART CATH AND CORONARY ANGIOGRAPHY N/A 08/11/2017   Procedure: RIGHT/LEFT HEART CATH AND CORONARY ANGIOGRAPHY;  Surgeon: Larey Dresser, MD;  Location: [facility];  Service: Cardiovascular;  Laterality: N/A;  . TRANSURETHRAL RESECTION OF PROSTATE  2017/2018    ROS- all systems are reviewed and negative except as per HPI above  Current Outpatient Medications  Medication Sig Dispense Refill  . aspirin EC 81 MG tablet Take 81 mg by mouth daily.    Marland Kitchen atorvastatin (LIPITOR) 40 MG tablet Take 1 tablet (40 mg total) by mouth daily at 6 PM. 30 tablet 6  . BLACK PEPPER-TURMERIC PO Take 1 capsule by mouth 2 (two) times daily.    . clopidogrel (PLAVIX) 75 MG tablet Take 1 tablet (75 mg total) by mouth daily with breakfast. 30 tablet 6  . Coenzyme Q10 (CO Q 10) 100 MG CAPS Take 100 mg by mouth daily.    Marland Kitchen glimepiride (AMARYL) 2 MG tablet Take 2  mg by mouth daily.    Marland Kitchen leflunomide (ARAVA) 10 MG tablet Take 1 tablet by mouth daily.    Marland Kitchen lisinopril (PRINIVIL,ZESTRIL) 10 MG tablet Take 1 tablet (10 mg total) by mouth daily. 90 tablet 3  . metFORMIN (GLUCOPHAGE) 500 MG tablet Take 500 mg by mouth 2 (two) times daily.    . mirabegron ER (MYRBETRIQ) 50 MG TB24 tablet Take 1 tablet (50 mg total) by mouth daily. 30 tablet 11  . nebivolol (BYSTOLIC) 5 MG tablet Take 1 tablet (5 mg total) by mouth daily. 30 tablet 6  . nitroGLYCERIN (NITROSTAT) 0.4 MG SL tablet DISSOLVE 1 TABLET UNDER THE TONGUE EVERY 5 MINUTES AS NEEDED FOR CHEST PAIN. 75 tablet 1  . Omega-3 Fatty Acids (OMEGA 3 PO) Take 1 tablet 2 (two)  times daily by mouth.    . Red Yeast Rice Extract (RED YEAST RICE PO) Take 1 capsule by mouth daily.    Marland Kitchen thyroid (ARMOUR) 90 MG tablet Take 90 mg by mouth every morning.     . TURMERIC CURCUMIN PO Take 1 capsule by mouth daily.     No current facility-administered medications for this visit.     Physical Exam: Vitals:   08/31/17 1127  BP: (!) 130/50  Pulse: 92  SpO2: 98%  Weight: 189 lb (85.7 kg)  Height: _0  (1.702 m)    GEN- The patient is well appearing, alert and oriented x 3 today.   Head- normocephalic, atraumatic Eyes-  Sclera clear, conjunctiva pink Ears- hearing intact Oropharynx- clear Lungs- Clear to ausculation bilaterally, normal work of breathing Chest- pacemaker pocket is well healed Heart- Regular rate and rhythm, no murmurs, rubs or gallops, PMI not laterally displaced GI- soft, NT, ND, + BS Extremities- no clubbing, cyanosis, or edema  Pacemaker interrogation- reviewed in detail today,  See PACEART report   Assessment and Plan:  1. Symptomatic complete heart block Normal pacemaker function See Pace Art report No changes today  2. SOB Some improvement with PCI.  EF is normal. 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 Will proceed with AV optimization when able as well, though I am not optimistic that this will change his overall symptoms  3. HTN Stable No change required today  [facility] AV optimization visit scheduled with follow-up with EP NP Return to see me in a year Follow-up with general cardiology in 3 months  [name] MD, [facility] 08/31/2017 11:36 AM

## 2017-08-31 NOTE — Patient Instructions (Addendum)
Medication Instructions:  Your physician recommends that you continue on your current medications as directed. Please refer to the Current Medication list given to you today.  Labwork: None ordered.  Testing/Procedures: None ordered.  Follow-Up: Your physician wants you to follow-up in: one year with [name].   You will receive a reminder letter in the mail two months in advance. If you don't receive a letter, please call our office to schedule the follow-up appointment.  Remote monitoring is used to monitor your Pacemaker from home. This monitoring reduces the number of office visits required to check your device to one time per year. It allows [place] to keep an eye on the functioning of your device to ensure it is working properly. You are scheduled for a device check from home on 11/11/2017. You may send your transmission at any time that day. If you have a wireless device, the transmission will be sent automatically. After your physician reviews your transmission, you will receive a postcard with your next transmission date.  Any Other Special Instructions Will Be Listed Below (If Applicable).  Referral is being made to Dr. Claiborne Billings for general cardiology follow up.  If you need a refill on your cardiac medications before your next appointment, please call your pharmacy.

## 2017-09-01 DIAGNOSIS — Z955 Presence of coronary angioplasty implant and graft: Secondary | ICD-10-CM

## 2017-09-01 LAB — GLUCOSE, CAPILLARY
Glucose-Capillary: 128 mg/dL — ABNORMAL HIGH (ref 65–99)
Glucose-Capillary: 82 mg/dL (ref 65–99)

## 2017-09-01 NOTE — Progress Notes (Signed)
Daily Session Note  Patient Details  Name: MAXXON SCHWANKE MRN: [id number] Date of Birth: 06/23/1940 Referring Provider:     Cardiac Rehab from 08/20/2017 in [facility] Cardiac and Pulmonary Rehab  Referring Provider  [name] MD      Encounter Date: 09/01/2017  Check In: Session Check In - 09/01/17 0829      Check-In   Location  [facility]-Cardiac & Pulmonary Rehab    Staff Present  Nada Maclachlan, BA, ACSM CEP, Exercise Physiologist;Jessica Luan Pulling, MA, RCEP, CCRP, Exercise Physiologist;Susanne Bice, RN, BSN, CCRP    Supervising physician immediately available to respond to emergencies  See telemetry face sheet for immediately available ER MD    Medication changes reported      No    Fall or balance concerns reported     No    Warm-up and Cool-down  Performed on first and last piece of equipment    Resistance Training Performed  Yes    VAD Patient?  No      Pain Assessment   Currently in Pain?  No/denies    Multiple Pain Sites  No          Social History   Tobacco Use  Smoking Status Former Smoker  . Packs/day: 1.00  . Years: 5.00  . Pack years: 5.00  . Types: Cigarettes  . Last attempt to quit: 07/10/1972  . Years since quitting: 45.1  Smokeless Tobacco Former Systems developer  . Types: Chew  . Quit date: 1975    Goals Met:  Independence with exercise equipment Exercise tolerated well No report of cardiac concerns or symptoms Strength training completed today  Goals Unmet:  Not Applicable  Comments: Pt able to follow exercise prescription today without complaint.  Will continue to monitor for progression.    Dr. Emily Filbert is Medical Director for [facility] and [facility].

## 2017-09-03 DIAGNOSIS — Z955 Presence of coronary angioplasty implant and graft: Secondary | ICD-10-CM | POA: Diagnosis not present

## 2017-09-03 LAB — GLUCOSE, CAPILLARY
Glucose-Capillary: 116 mg/dL — ABNORMAL HIGH (ref 65–99)
Glucose-Capillary: 95 mg/dL (ref 65–99)

## 2017-09-03 NOTE — Progress Notes (Signed)
Daily Session Note  Patient Details  [name] MRN: [id number] Date of Birth: [birth year] Referring Provider:     Cardiac Rehab from 08/20/2017 in [facility] Cardiac and Pulmonary Rehab  Referring Provider  [name] MD      Encounter Date: 09/03/2017  Check In: Session Check In - 09/03/17 0827      Check-In   Location  [facility]-Cardiac & Pulmonary Rehab    Staff Present  Alberteen Sam, MA, RCEP, CCRP, Exercise Physiologist;Amanda Oletta Darter, BA, ACSM CEP, Exercise Physiologist;Carroll Enterkin, RN, BSN    Supervising physician immediately available to respond to emergencies  See telemetry face sheet for immediately available ER MD    Medication changes reported      No    Fall or balance concerns reported     No    Warm-up and Cool-down  Performed on first and last piece of equipment    Resistance Training Performed  Yes    VAD Patient?  No      Pain Assessment   Currently in Pain?  No/denies    Multiple Pain Sites  No          Social History   Tobacco Use  Smoking Status Former Smoker  . Packs/day: 1.00  . Years: 5.00  . Pack years: 5.00  . Types: Cigarettes  . Last attempt to quit: 07/10/1972  . Years since quitting: 45.1  Smokeless Tobacco Former Systems developer  . Types: Chew  . Quit date: 1975    Goals Met:  Independence with exercise equipment Exercise tolerated well No report of cardiac concerns or symptoms Strength training completed today  Goals Unmet:  Not Applicable  Comments: Pt able to follow exercise prescription today without complaint.  Will continue to monitor for progression.    Dr. Emily Filbert is Medical Director for [facility] [place] and [facility].

## 2017-09-04 ENCOUNTER — Other Ambulatory Visit: Payer: Self-pay

## 2017-09-04 DIAGNOSIS — Z950 Presence of cardiac pacemaker: Secondary | ICD-10-CM

## 2017-09-04 DIAGNOSIS — I442 Atrioventricular block, complete: Secondary | ICD-10-CM

## 2017-09-04 NOTE — Progress Notes (Signed)
ECHO entered for AV opt with Chanetta Marshall

## 2017-09-07 ENCOUNTER — Telehealth: Payer: Self-pay | Admitting: Internal Medicine

## 2017-09-07 NOTE — Telephone Encounter (Signed)
Returned call to Pt daughter.  Per daughter Pt is complaining of right leg feeling 'hot'.  It is not swollen or painful.  Pt recent cardiac cath was through his wrist.  Pt is on ASA and Plavix.   Advised daughter to continue to monitor.  If leg becomes more swollen and painful she should seek medical attention for Pt.  Daughter indicates understanding.

## 2017-09-07 NOTE — Telephone Encounter (Signed)
Called to make appt for AV Opt Echo, per pt's dtr pt complaining of right sided aching and warm feeling in leg. Was having swelling but none since stent placement. This occurs with and without activity-pls advise dtr Vickie

## 2017-09-08 ENCOUNTER — Ambulatory Visit
Admission: RE | Admit: 2017-09-08 | Discharge: 2017-09-08 | Disposition: A | Discharge status: Home / Self Care | Payer: Medicare Other | Source: Ambulatory Visit | Attending: Physician Assistant | Admitting: Physician Assistant

## 2017-09-08 ENCOUNTER — Telehealth: Payer: Self-pay | Admitting: Cardiovascular Disease

## 2017-09-08 ENCOUNTER — Other Ambulatory Visit: Payer: Self-pay | Admitting: Physician Assistant

## 2017-09-08 ENCOUNTER — Encounter: Payer: Medicare Other | Admitting: *Deleted

## 2017-09-08 DIAGNOSIS — M7989 Other specified soft tissue disorders: Secondary | ICD-10-CM | POA: Diagnosis not present

## 2017-09-08 DIAGNOSIS — M79604 Pain in right leg: Secondary | ICD-10-CM | POA: Insufficient documentation

## 2017-09-08 DIAGNOSIS — R936 Abnormal findings on diagnostic imaging of limbs: Secondary | ICD-10-CM | POA: Insufficient documentation

## 2017-09-08 DIAGNOSIS — Z955 Presence of coronary angioplasty implant and graft: Secondary | ICD-10-CM | POA: Diagnosis not present

## 2017-09-08 DIAGNOSIS — M7121 Synovial cyst of popliteal space [Baker], right knee: Secondary | ICD-10-CM | POA: Insufficient documentation

## 2017-09-08 NOTE — Telephone Encounter (Signed)
New message:    Pt's wife calling to schedule an appt w/Kelly per phone call from office. First available for Dr. Is July and with PA is 5/22. Pt is still having swelling and pain in the right leg

## 2017-09-08 NOTE — Telephone Encounter (Signed)
Returned call to patient of [name], who is supposed to establish care with Dr. Claiborne Billings who did his stents. He was seen by [name] last week on 4/8.  Patient is still having some SOB but wife/patient's main concern is that since he had stents put in, he has burning in his right leg and he can hardly walk on this leg. Willeen Cass RN spoke with patient's daughter about this yesterday as well.  Wife states that he was told by PT that this was a baker's cyst behind the knee, and was told this by his orthopaedist Dr. Rhona Raider and by cardiac rehab.  Reiterated recommendations per RN yesterday. Advised he can call back if further assistance is needed.

## 2017-09-08 NOTE — Progress Notes (Signed)
Daily Session Note  Patient Details  [name] MRN: [id number] Date of Birth: 07/09/1940 Referring Provider:     Cardiac Rehab from 08/20/2017 in [facility] Cardiac and Pulmonary Rehab  Referring Provider  Allred, Khaleef MD      Encounter Date: 09/08/2017  Check In: Session Check In - 09/08/17 0930      Check-In   Location  [facility]-Cardiac & Pulmonary Rehab    Staff Present  Susanne Bice, RN, BSN, CCRP; , MA, RCEP, CCRP, Exercise Physiologist;Amanda Sommer, BA, ACSM CEP, Exercise Physiologist    Supervising physician immediately available to respond to emergencies  See telemetry face sheet for immediately available ER MD    Medication changes reported      No    Fall or balance concerns reported     No    Warm-up and Cool-down  Performed on first and last piece of equipment    Resistance Training Performed  Yes    VAD Patient?  No      Pain Assessment   Currently in Pain?  No/denies          Social History   Tobacco Use  Smoking Status Former Smoker  . Packs/day: 1.00  . Years: 5.00  . Pack years: 5.00  . Types: Cigarettes  . Last attempt to quit: 07/10/1972  . Years since quitting: 45.1  Smokeless Tobacco Former User  . Types: Chew  . Quit date: 1975    Goals Met:  Independence with exercise equipment Exercise tolerated well No report of cardiac concerns or symptoms Strength training completed today  Goals Unmet:  Not Applicable  Comments: Pt able to follow exercise prescription today without complaint.  Will continue to monitor for progression.  Cameron [name] was complaining that his leg was hurting behind his knee.  RN looked at it and it was warm to touch and had some swelling.  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  He was encouraged to call the doctor to be seen.  He was able to exercise today and just went a little lighter on the second piece of equipment.     Dr. Mark Miller is Medical Director for  [facility] and [facility]. 

## 2017-09-08 NOTE — Telephone Encounter (Signed)
I see that he is having repeat ultrasound of his LE per PCP today. They will follow this.

## 2017-09-10 ENCOUNTER — Telehealth: Payer: Self-pay | Admitting: Cardiovascular Disease

## 2017-09-10 ENCOUNTER — Encounter: Payer: Medicare Other | Admitting: *Deleted

## 2017-09-10 DIAGNOSIS — Z955 Presence of coronary angioplasty implant and graft: Secondary | ICD-10-CM | POA: Diagnosis not present

## 2017-09-10 NOTE — Telephone Encounter (Signed)
Called patient and LVM to call back to schedule 3 month followup with Dr. Claiborne Billings.

## 2017-09-10 NOTE — Progress Notes (Signed)
Daily Session Note  Patient Details  Name: BRODE SCULLEY MRN: [id number] Date of Birth: 04-26-1941 Referring Provider:     Cardiac Rehab from 08/20/2017 in [facility] Cardiac and Pulmonary Rehab  Referring Provider  [name] MD      Encounter Date: 09/10/2017  Check In: Session Check In - 09/10/17 0859      Check-In   Location  [facility]-Cardiac & Pulmonary Rehab    Staff Present  Alberteen Sam, MA, RCEP, CCRP, Exercise Physiologist;Amanda Oletta Darter, BA, ACSM CEP, Exercise Physiologist;Carroll Enterkin, RN, BSN    Supervising physician immediately available to respond to emergencies  See telemetry face sheet for immediately available ER MD    Medication changes reported      No    Fall or balance concerns reported     No    Warm-up and Cool-down  Performed on first and last piece of equipment    Resistance Training Performed  Yes    VAD Patient?  No      Pain Assessment   Currently in Pain?  No/denies          Social History   Tobacco Use  Smoking Status Former Smoker  . Packs/day: 1.00  . Years: 5.00  . Pack years: 5.00  . Types: Cigarettes  . Last attempt to quit: 07/10/1972  . Years since quitting: 45.2  Smokeless Tobacco Former Systems developer  . Types: Chew  . Quit date: 1975    Goals Met:  Independence with exercise equipment Exercise tolerated well No report of cardiac concerns or symptoms Strength training completed today  Goals Unmet:  Not Applicable  Comments: Pt able to follow exercise prescription today without complaint.  Will continue to monitor for progression.    Dr. Emily Filbert is Medical Director for [place] and [facility].

## 2017-09-15 ENCOUNTER — Encounter: Payer: Medicare Other | Admitting: Nurse Practitioner

## 2017-09-15 ENCOUNTER — Encounter: Payer: Medicare Other | Admitting: *Deleted

## 2017-09-15 DIAGNOSIS — Z955 Presence of coronary angioplasty implant and graft: Secondary | ICD-10-CM

## 2017-09-15 NOTE — Progress Notes (Signed)
Daily Session Note  Patient Details  [name] MRN: [id number] Date of Birth: 31-Oct-1940 Referring Provider:     Cardiac Rehab from 08/20/2017 in [facility] Cardiac and Pulmonary Rehab  Referring Provider  [name] MD      Encounter Date: 09/15/2017  Check In: Session Check In - 09/15/17 0820      Check-In   Location  [facility]-Cardiac & Pulmonary Rehab    Staff Present  Heath Lark, RN, BSN, CCRP;Caressa Scearce [place], MA, RCEP, CCRP, Exercise Physiologist;Amanda Oletta Darter, [place], ACSM CEP, Exercise Physiologist    Supervising physician immediately available to respond to emergencies  See telemetry face sheet for immediately available ER MD    Medication changes reported      No    Fall or balance concerns reported     No    Warm-up and Cool-down  Performed on first and last piece of equipment    Resistance Training Performed  Yes    VAD Patient?  No      Pain Assessment   Currently in Pain?  No/denies          Social History   Tobacco Use  Smoking Status Former Smoker  . Packs/day: 1.00  . Years: 5.00  . Pack years: 5.00  . Types: Cigarettes  . Last attempt to quit: 07/10/1972  . Years since quitting: 45.2  Smokeless Tobacco Former Systems developer  . Types: Chew  . Quit date: 1975    Goals Met:  Independence with exercise equipment Exercise tolerated well No report of cardiac concerns or symptoms Strength training completed today  Goals Unmet:  Not Applicable  Comments: Pt able to follow exercise prescription today without complaint.  Will continue to monitor for progression. Reviewed home exercise with pt today.  Pt plans to continue to attend the exercise classes the community center in [place] on his off days.  Reviewed THR, pulse, RPE, sign and symptoms, NTG use, and when to call 911 or MD.  Also discussed weather considerations and indoor options.  Pt voiced understanding.     Dr. Emily Filbert is Medical Director for [place] and [facility].

## 2017-09-16 ENCOUNTER — Encounter: Payer: Self-pay | Admitting: *Deleted

## 2017-09-16 DIAGNOSIS — Z955 Presence of coronary angioplasty implant and graft: Secondary | ICD-10-CM

## 2017-09-16 NOTE — Progress Notes (Signed)
Cardiac Individual Treatment Plan  Patient Details  [name] MRN: [id number] Date of Birth: 02/07/1941 Referring Provider:     Cardiac Rehab from 08/20/2017 in [facility] Cardiac and Pulmonary Rehab  Referring Provider  [name] MD      Initial Encounter Date:    Cardiac Rehab from 08/20/2017 in [facility] Cardiac and Pulmonary Rehab  Date  08/20/17  Referring Provider  [name] MD      Visit Diagnosis: Status post coronary artery stent placement  Patient's Home Medications on Admission:  Current Outpatient Medications:  .  aspirin EC 81 MG tablet, Take 81 mg by mouth daily., Disp: , Rfl:  .  atorvastatin (LIPITOR) 40 MG tablet, Take 1 tablet (40 mg total) by mouth daily at 6 PM., Disp: 30 tablet, Rfl: 6 .  BLACK PEPPER-TURMERIC PO, Take 1 capsule by mouth 2 (two) times daily., Disp: , Rfl:  .  clopidogrel (PLAVIX) 75 MG tablet, Take 1 tablet (75 mg total) by mouth daily with breakfast., Disp: 30 tablet, Rfl: 6 .  Coenzyme Q10 (CO Q 10) 100 MG CAPS, Take 100 mg by mouth daily., Disp: , Rfl:  .  glimepiride (AMARYL) 2 MG tablet, Take 2 mg by mouth daily., Disp: , Rfl:  .  leflunomide (ARAVA) 10 MG tablet, Take 1 tablet by mouth daily., Disp: , Rfl:  .  lisinopril (PRINIVIL,ZESTRIL) 10 MG tablet, Take 1 tablet (10 mg total) by mouth daily., Disp: 90 tablet, Rfl: 3 .  metFORMIN (GLUCOPHAGE) 500 MG tablet, Take 500 mg by mouth 2 (two) times daily., Disp: , Rfl:  .  mirabegron ER (MYRBETRIQ) 50 MG TB24 tablet, Take 1 tablet (50 mg total) by mouth daily., Disp: 30 tablet, Rfl: 11 .  nebivolol (BYSTOLIC) 5 MG tablet, Take 1 tablet (5 mg total) by mouth daily., Disp: 30 tablet, Rfl: 6 .  nitroGLYCERIN (NITROSTAT) 0.4 MG SL tablet, DISSOLVE 1 TABLET UNDER THE TONGUE EVERY 5 MINUTES AS NEEDED FOR CHEST PAIN., Disp: 75 tablet, Rfl: 1 .  Omega-3 Fatty Acids (OMEGA 3 PO), Take 1 tablet 2 (two) times daily by mouth., Disp: , Rfl:  .  Red Yeast Rice Extract (RED YEAST RICE PO), Take 1  capsule by mouth daily., Disp: , Rfl:  .  thyroid (ARMOUR) 90 MG tablet, Take 90 mg by mouth every morning. , Disp: , Rfl:  .  TURMERIC CURCUMIN PO, Take 1 capsule by mouth daily., Disp: , Rfl:   Past Medical History: Past Medical History:  Diagnosis Date  . BPH (benign prostatic hypertrophy)   . CAD (coronary artery disease) 12/2015   Cath by Dr [name] reveals distal and small vessel CAD.  Medical therapy advised.  . CHF (congestive heart failure) ([place])   . 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 History of blood transfusion 1968   "probably; related to getting wounded in [place]  . History of kidney stones   . Hypertension   . Hypothyroidism   . OSA on CPAP   . 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 Rheumatoid arthritis ([place])    "hands" (08/11/2017)  . Type II diabetes mellitus (HCC)     Tobacco Use: Social History   Tobacco Use  Smoking Status Former Smoker  . Packs/day: 1.00  . Years: 5.00  . Pack years: 5.00  . Types: Cigarettes  . Last attempt to quit: 07/10/1972  . Years since quitting: 45.2  Smokeless Tobacco Former Systems developer  . Types: Chew  .  Quit date: 1975    Labs: Recent Merchant [employer] officer for ITP Cardiac and Pulmonary Rehab Latest Ref Rng & Units 08/11/2017 08/11/2017   HCO3 20.0 - 28.0 mmol/L 24.6 24.5   TCO2 22 - 32 mmol/L 26 26   ACIDBASEDEF 0.0 - 2.0 mmol/L 2.0 2.0   O2SAT % 62.0 63.0       Exercise Target Goals:    Exercise Program Goal: Individual exercise prescription set using results from initial 6 min walk test and THRR while considering  patient's activity barriers and safety.   Exercise Prescription Goal: Initial exercise prescription builds to 30-45 minutes a day of aerobic activity, 2-3 days per week.  Home exercise guidelines will be given to patient during program as part of exercise prescription that the participant will acknowledge.  Activity Barriers & Risk Stratification: Activity Barriers & Cardiac Risk  Stratification - 08/20/17 1317      Activity Barriers & Cardiac Risk Stratification   Activity Barriers  Shortness of Breath;Deconditioning    Cardiac Risk Stratification  Moderate       6 Minute Walk: 6 Minute Walk    Row Name 08/20/17 1322         6 Minute Walk   Phase  Initial     Distance  1450 feet     Walk Time  6 minutes     # of Rest Breaks  0     MPH  2.75     METS  3.23     RPE  11     VO2 Peak  11.3     Symptoms  No     Resting HR  61 bpm     Resting BP  126/72     Resting Oxygen Saturation   100 %     Exercise Oxygen Saturation  during 6 min walk  100 %     Max Ex. HR  118 bpm     Max Ex. BP  174/84     2 Minute Post BP  124/70        Oxygen Initial Assessment:   Oxygen Re-Evaluation:   Oxygen Discharge (Final Oxygen Re-Evaluation):   Initial Exercise Prescription: Initial Exercise Prescription - 08/20/17 1300      Date of Initial Exercise RX and Referring Provider   Date  08/20/17    Referring Provider  [name] MD      Treadmill   MPH  2.7    Grade  0.5    Minutes  15    METs  3.25      Recumbant Bike   Level  2    RPM  50    Watts  33    Minutes  15    METs  3.2      T5 Nustep   Level  2    SPM  80    Minutes  15    METs  3.2      Prescription Details   Frequency (times per week)  2    Duration  Progress to 30 minutes of continuous aerobic without signs/symptoms of physical distress      Intensity   THRR 40-80% of Max Heartrate  94-127    Ratings of Perceived Exertion  11-13    Perceived Dyspnea  0-4      Progression   Progression  Continue to progress workloads to maintain intensity without signs/symptoms of physical distress.      Resistance Training  Training Prescription  Yes    Weight  4 lbs    Reps  10-15       Perform Capillary Blood Glucose checks as needed.  Exercise Prescription Changes: Exercise Prescription Changes    Row Name 08/20/17 1300 09/08/17 1500 09/15/17 0900         Response to  Exercise   Blood Pressure (Admit)  126/72  102/64  -     Blood Pressure (Exercise)  174/84  134/70  -     Blood Pressure (Exit)  124/70  120/64  -     Heart Rate (Admit)  61 bpm  61 bpm  -     Heart Rate (Exercise)  118 bpm  121 bpm  -     Heart Rate (Exit)  69 bpm  60 bpm  -     Oxygen Saturation (Admit)  100 %  -  -     Oxygen Saturation (Exercise)  100 %  -  -     Rating of Perceived Exertion (Exercise)  11  12  -     Symptoms  none  none  -     Comments  walk test results  -  -     Duration  -  Continue with 30 min of aerobic exercise without signs/symptoms of physical distress.  -     Intensity  -  THRR unchanged  -       Progression   Progression  -  Continue to progress workloads to maintain intensity without signs/symptoms of physical distress.  -     Average METs  -  2.98  -       Resistance Training   Training Prescription  -  Yes  -     Weight  -  4 lbs  -     Reps  -  10-15  -       Interval Training   Interval Training  -  No  -       Treadmill   MPH  -  2.7  -     Grade  -  0.5  -     Minutes  -  15  -     METs  -  3.25  -       Recumbant Bike   Level  -  2  -     Watts  -  33  -     Minutes  -  15  -     METs  -  3.3  -       T5 Nustep   Level  -  3  -     Minutes  -  15  -     METs  -  2.4  -       Home Exercise Plan   Plans to continue exercise at  -  -  [employer] [employer] (comment) classes at [place] 3 additional days to program exercise sessions.     Initial Home Exercises Provided  -  -  09/15/17        Exercise Comments: Exercise Comments    Row Name 08/27/17 [phone] 09/08/17 0933         Exercise Comments  First full day of exercise!  Patient was oriented to gym and equipment including functions, settings, policies, and procedures.  Patient's individual exercise prescription and treatment plan were reviewed.  All starting workloads were established based on the results of the 6 minute walk test done  at initial orientation visit.  The plan for exercise progression was also introduced and progression will be customized based on patient's performance and goals.  Cameron [name] was complaining that his leg was hurting behind his knee.  RN looked at it and it was warm to touch and had some swelling.  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  He was encouraged to call the doctor to be seen.  He was able to exercise today and just went a little lighter on the second piece of equipment.          Exercise Goals and Review: Exercise Goals    Row Name 08/20/17 1326             Exercise Goals   Increase Physical Activity  Yes       Intervention  Provide advice, education, support and counseling about physical activity/exercise needs.;Develop an individualized exercise prescription for aerobic and resistive training based on initial evaluation findings, risk stratification, comorbidities and participant's personal goals.       Expected Outcomes  Short Term: Attend rehab on a regular basis to increase amount of physical activity.;Long Term: Add in home exercise to make exercise part of routine and to increase amount of physical activity.;Long Term: Exercising regularly at least 3-5 days a week.       Increase Strength and Stamina  Yes       Intervention  Provide advice, education, support and counseling about physical activity/exercise needs.;Develop an individualized exercise prescription for aerobic and resistive training based on initial evaluation findings, risk stratification, comorbidities and participant's personal goals.       Expected Outcomes  Short Term: Increase workloads from initial exercise prescription for resistance, speed, and METs.;Short Term: Perform resistance training exercises routinely during rehab and add in resistance training at home;Long Term: Improve cardiorespiratory fitness, muscular endurance and strength as measured by increased METs and functional  capacity (6MWT)       Able to understand and use rate of perceived exertion (RPE) scale  Yes       Intervention  Provide education and explanation on how to use RPE scale       Expected Outcomes  Short Term: Able to use RPE daily in rehab to express subjective intensity level;Long Term:  Able to use RPE to guide intensity level when exercising independently       Able to understand and use Dyspnea scale  Yes       Intervention  Provide education and explanation on how to use Dyspnea scale       Expected Outcomes  Short Term: Able to use Dyspnea scale daily in rehab to express subjective sense of shortness of breath during exertion;Long Term: Able to use Dyspnea scale to guide intensity level when exercising independently       Knowledge and understanding of Target Heart Rate Range (THRR)  Yes       Intervention  Provide education and explanation of THRR including how the numbers were predicted and where they are located for reference       Expected Outcomes  Short Term: Able to state/look up THRR;Long Term: Able to use THRR to govern intensity when exercising independently;Short Term: Able to use daily as guideline for intensity in rehab       Able to check pulse independently  Yes  Intervention  Provide education and demonstration on how to check pulse in carotid and radial arteries.;Review the importance of being able to check your own pulse for safety during independent exercise       Expected Outcomes  Short Term: Able to explain why pulse checking is important during independent exercise;Long Term: Able to check pulse independently and accurately       Understanding of Exercise Prescription  Yes       Intervention  Provide education, explanation, and written materials on patient's individual exercise prescription       Expected Outcomes  Short Term: Able to explain program exercise prescription;Long Term: Able to explain home exercise prescription to exercise independently           Exercise Goals Re-Evaluation : Exercise Goals Re-Evaluation    Row Name 08/27/17 1505 09/08/17 1520 09/15/17 0939         Exercise Goal Re-Evaluation   Exercise Goals Review  Increase Physical Activity;Knowledge and understanding of Target Heart Rate Range (THRR);Able to understand and use rate of perceived exertion (RPE) scale;Understanding of Exercise Prescription  Increase Physical Activity;Understanding of Exercise Prescription;Increase Strength and Stamina  Increase Physical Activity;Understanding of Exercise Prescription;Increase Strength and Stamina;Able to understand and use rate of perceived exertion (RPE) scale;Knowledge and understanding of Target Heart Rate Range (THRR);Able to check pulse independently     Comments  Reviewed RPE scale, THR and program prescription with pt today.  Pt voiced understanding and was given a copy of goals to take home.   Cameron [name] is off to a good start in rehab.  He is now on level 3 on the T5 NuStep.  We will continue to monitor his progression.   Reviewed home exercise with pt today.  Pt plans to continue to attend the exercise classes the community center in [place] on his off days.  Reviewed THR, pulse, RPE, sign and symptoms, NTG use, and when to call 911 or MD.  Also discussed weather considerations and indoor options.  Pt voiced understanding.     Expected Outcomes  Short: Use RPE daily to regulate intensity.  Long: Follow program prescription in THR.  Short: Review home exercise.  Long: Continue to increase physical acitvity.   Short: Continue to attend exercise classes on off days.  Long: Continue to increase strength and stamina.         Discharge Exercise Prescription (Final Exercise Prescription Changes): Exercise Prescription Changes - 09/15/17 0900      Home Exercise Plan   Plans to continue exercise at  [facility] (comment) classes at [facility]    Frequency  Add 3 additional days to program exercise sessions.     Initial Home Exercises Provided  09/15/17       Nutrition:  Target Goals: Understanding of nutrition guidelines, daily intake of sodium <1532m, cholesterol <20108m calories 30% from fat and 7% or less from saturated fats, daily to have 5 or more servings of fruits and vegetables.  Biometrics: Pre Biometrics - 08/20/17 1326      Pre Biometrics   Height  5' 7.1" (1.704 m)    Weight  187 lb 8 oz (85 kg)    Waist Circumference  39 inches    Hip Circumference  38 inches    Waist to Hip Ratio  1.03 %    BMI (Calculated)  29.29    Single Leg Stand  30 seconds        Nutrition Therapy Plan and Nutrition Goals: Nutrition Therapy &  Goals - 09/03/17 1042      Nutrition Therapy   Diet  DM, DASH    Drug/Food Interactions  Statins/Certain Fruits    Protein (specify units)  11oz    Fiber  35 grams    Whole Grain Foods  3 servings    Saturated Fats  13 max. grams    Fruits and Vegetables  6 servings/day 8 ideal    Sodium  1500 grams      Personal Nutrition Goals   Nutrition Goal  Because you eat out frequently, look for ways to reduce added salt, such as asking for no salt, removing the skin from meats, and choosing non-fried/ greasy food    Personal Goal #2  Choose evening snacks that include both a carbohydrate source and a protein source to manage blood glucose control    Personal Goal #3  Avoid grapefruit while taking Lipitor    Comments  He limits intake of processed foods, fried food, soda, red meat and white starches. His wife provides whole grains and vegetables frequently      Intervention Plan   Intervention  Prescribe, educate and counsel regarding individualized specific dietary modifications aiming towards targeted core components such as weight, hypertension, lipid management, diabetes, heart failure and other comorbidities.    Expected Outcomes  Short Term Goal: Understand basic principles of dietary content, such as calories, fat, sodium, cholesterol and nutrients.;Long  Term Goal: Adherence to prescribed nutrition plan.;Short Term Goal: A plan has been developed with personal nutrition goals set during dietitian appointment.       Nutrition Assessments: Nutrition Assessments - 08/27/17 1304      MEDFICTS Scores   Pre Score  29       Nutrition Goals Re-Evaluation: Nutrition Goals Re-Evaluation    [place] Name 09/03/17 1052 09/03/17 1053           Goals   Nutrition Goal  Because you eat out frequently, continue to make nutrient-dense choices and look for ways to reduce added salt. For example, asking for no salt, removing the skin from meats, choosing non-fried/ greasy food  Choose evening snacks that include both a carbohydrate and a protein source for better blood glucose control      Comment  He and his wife eat out frequently but he does try to be conscious of the choices he makes  He currently does not always choose a protein source at snack times but does choose lower salt/calorie options like home-popped popcorn      Expected Outcome  He will choose vegetables, lean proteins, complex carbohydrates, and limit salt when eating out  He will add a protein source to his evening snacks, such as yogurt, cheese or nuts        Personal Goal #2 Re-Evaluation   Personal Goal #2  -  Avoid grapefruit while on Lipitor         Nutrition Goals Discharge (Final Nutrition Goals Re-Evaluation): Nutrition Goals Re-Evaluation - 09/03/17 1053      Goals   Nutrition Goal  Choose evening snacks that include both a carbohydrate and a protein source for better blood glucose control    Comment  He currently does not always choose a protein source at snack times but does choose lower salt/calorie options like home-popped popcorn    Expected Outcome  He will add a protein source to his evening snacks, such as yogurt, cheese or nuts      Personal Goal #2 Re-Evaluation   Personal Goal #2  Avoid grapefruit while on Lipitor       Psychosocial: Target Goals: Acknowledge  presence or absence of significant depression and/or stress, maximize coping skills, provide positive support system. Participant is able to verbalize types and ability to use techniques and skills needed for reducing stress and depression.   Initial Review & Psychosocial Screening: Initial Psych Review & Screening - 08/20/17 1309      Initial Review   Current issues with  None Identified did not reveal any to staff during orientation      [place]?  Yes family      Screening Interventions   Interventions  Encouraged to exercise;Program counselor consult;To provide support and resources with identified psychosocial needs;Provide feedback about the scores to participant    Expected Outcomes  Short Term goal: Utilizing psychosocial counselor, staff and physician to assist with identification of specific Stressors or current issues interfering with healing process. Setting desired goal for each stressor or current issue identified.;Long Term Goal: Stressors or current issues are controlled or eliminated.;Short Term goal: Identification and review with participant of any Quality of Life or Depression concerns found by scoring the questionnaire.;Long Term goal: The participant improves quality of Life and PHQ9 Scores as seen by post scores and/or verbalization of changes       Quality of Life Scores:  Quality of Life - 08/20/17 1316      Quality of Life Scores   Health/Function Pre  27.27 %    Socioeconomic Pre  30 %    Psych/Spiritual Pre  30 %    Family Pre  30 %    GLOBAL Pre  28.83 %      Scores of 19 and below usually indicate a poorer quality of life in these areas.  A difference of  2-3 points is a clinically meaningful difference.  A difference of 2-3 points in the total score of the Quality of Life Index has been associated with significant improvement in overall quality of life, self-image, physical symptoms, and general health in studies assessing change  in quality of life.  PHQ-9: Recent Review Flowsheet Data    Depression screen [facility] 2/9 08/20/2017   Decreased Interest 0   Down, Depressed, Hopeless 0   PHQ - 2 Score 0   Altered sleeping 0   Tired, decreased energy 0   Change in appetite 0   Feeling bad or failure about yourself  0   Trouble concentrating 0   Moving slowly or fidgety/restless 0   Suicidal thoughts 0   PHQ-9 Score 0     Interpretation of Total Score  Total Score Depression Severity:  1-4 = Minimal depression, 5-9 = Mild depression, 10-14 = Moderate depression, 15-19 = Moderately severe depression, 20-27 = Severe depression   Psychosocial Evaluation and Intervention:   Psychosocial Re-Evaluation:   Psychosocial Discharge (Final Psychosocial Re-Evaluation):   Vocational Rehabilitation: Provide vocational rehab assistance to qualifying candidates.   Vocational Rehab Evaluation & Intervention: Vocational Rehab - 08/20/17 1317      Initial Vocational Rehab Evaluation & Intervention   Assessment shows need for Vocational Rehabilitation  No       Education: Education Goals: Education classes will be provided on a variety of topics geared toward better understanding of heart health and risk factor modification. Participant will state understanding/return demonstration of topics presented as noted by education test scores.  Learning Barriers/Preferences: Learning Barriers/Preferences - 08/20/17 1316      Learning Barriers/Preferences   Learning  Barriers  None    Learning Preferences  None       Education Topics:  AED/CPR: - Group verbal and written instruction with the use of models to demonstrate the basic use of the AED with the basic ABC's of resuscitation.   Cardiac Rehab from 09/15/2017 in [facility] Cardiac and Pulmonary Rehab  Date  08/27/17  Educator  CE  Instruction Review Code  1- Verbalizes Understanding      General Nutrition Guidelines/Fats and Fiber: -Group instruction provided by verbal,  written material, models and posters to present the general guidelines for heart healthy nutrition. Gives an explanation and review of dietary fats and fiber.   Controlling Sodium/Reading Food Labels: -Group verbal and written material supporting the discussion of sodium use in heart healthy nutrition. Review and explanation with models, verbal and written materials for utilization of the food label.   Exercise Physiology & General Exercise Guidelines: - Group verbal and written instruction with models to review the exercise physiology of the cardiovascular system and associated critical values. Provides general exercise guidelines with specific guidelines to those with heart or lung disease.    Cardiac Rehab from 09/15/2017 in [facility] Cardiac and Pulmonary Rehab  Date  09/03/17  Educator  [facility]  Instruction Review Code  1- Verbalizes Understanding      Aerobic Exercise & Resistance Training: - Gives group verbal and written instruction on the various components of exercise. Focuses on aerobic and resistive training programs and the benefits of this training and how to safely progress through these programs..   Cardiac Rehab from 09/15/2017 in [facility] Cardiac and Pulmonary Rehab  Date  09/08/17  Educator  AS  Instruction Review Code  1- Verbalizes Understanding      Flexibility, Balance, Mind/Body Relaxation: Provides group verbal/written instruction on the benefits of flexibility and balance training, including mind/body exercise modes such as yoga, pilates and tai chi.  Demonstration and skill practice provided.   Cardiac Rehab from 09/15/2017 in [facility] Cardiac and Pulmonary Rehab  Date  09/15/17  Educator  AS  Instruction Review Code  1- Verbalizes Understanding      Stress and Anxiety: - Provides group verbal and written instruction about the health risks of elevated stress and causes of high stress.  Discuss the correlation between heart/lung disease and anxiety and treatment options.  Review healthy ways to manage with stress and anxiety.   Depression: - Provides group verbal and written instruction on the correlation between heart/lung disease and depressed mood, treatment options, and the stigmas associated with seeking treatment.   Cardiac Rehab from 09/15/2017 in [facility] Cardiac and Pulmonary Rehab  Date  09/10/17  Educator  [facility]  Instruction Review Code  1- Verbalizes Understanding      Anatomy & Physiology of the Heart: - Group verbal and written instruction and models provide basic cardiac anatomy and physiology, with the coronary electrical and arterial systems. Review of Valvular disease and Heart Failure   Cardiac Procedures: - Group verbal and written instruction to review commonly prescribed medications for heart disease. Reviews the medication, class of the drug, and side effects. Includes the steps to properly store meds and maintain the prescription regimen. (beta blockers and nitrates)   Cardiac Medications I: - Group verbal and written instruction to review commonly prescribed medications for heart disease. Reviews the medication, class of the drug, and side effects. Includes the steps to properly store meds and maintain the prescription regimen.   Cardiac Medications II: -Group verbal and written instruction to review  commonly prescribed medications for heart disease. Reviews the medication, class of the drug, and side effects. (all other drug classes)    Go Sex-Intimacy & Heart Disease, Get SMART - Goal Setting: - Group verbal and written instruction through game format to discuss heart disease and the return to sexual intimacy. Provides group verbal and written material to discuss and apply goal setting through the application of the S.M.A.R.T. Method.   Other Matters of the Heart: - Provides group verbal, written materials and models to describe Stable Angina and Peripheral Artery. Includes description of the disease process and treatment options  available to the cardiac patient.   Exercise & Equipment Safety: - Individual verbal instruction and demonstration of equipment use and safety with use of the equipment.   Cardiac Rehab from 09/15/2017 in [facility] Cardiac and Pulmonary Rehab  Date  08/20/17  Educator  [facility]  Instruction Review Code  1- Verbalizes Understanding      Infection Prevention: - Provides verbal and written material to individual with discussion of infection control including proper hand washing and proper equipment cleaning during exercise session.   Cardiac Rehab from 09/15/2017 in [facility] Cardiac and Pulmonary Rehab  Date  08/20/17  Educator  [facility]  Instruction Review Code  1- Verbalizes Understanding      Falls Prevention: - Provides verbal and written material to individual with discussion of falls prevention and safety.   Cardiac Rehab from 09/15/2017 in [facility] Cardiac and Pulmonary Rehab  Date  08/20/17  Educator  [facility]  Instruction Review Code  1- Verbalizes Understanding      Diabetes: - Individual verbal and written instruction to review signs/symptoms of diabetes, desired ranges of glucose level fasting, after meals and with exercise. Acknowledge that pre and post exercise glucose checks will be done for 3 sessions at entry of program.   Cardiac Rehab from 09/15/2017 in [facility] Cardiac and Pulmonary Rehab  Date  08/20/17  Educator  [facility]  Instruction Review Code  1- Verbalizes Understanding      Know Your Numbers and Risk Factors: -Group verbal and written instruction about important numbers in your health.  Discussion of what are risk factors and how they play a role in the disease process.  Review of Cholesterol, Blood Pressure, Diabetes, and BMI and the role they play in your overall health.   Sleep Hygiene: -Provides group verbal and written instruction about how sleep can affect your health.  Define sleep hygiene, discuss sleep cycles and impact of sleep habits. Review good sleep hygiene tips.     Other: -Provides group and verbal instruction on various topics (see comments)   Knowledge Questionnaire Score: Knowledge Questionnaire Score - 08/20/17 1316      Knowledge Questionnaire Score   Pre Score  26/28 correct answers reviewed with Cameron [name]       Core Components/Risk Factors/Patient Goals at Admission: Personal Goals and Risk Factors at Admission - 08/20/17 1305      Core Components/Risk Factors/Patient Goals on Admission    Weight Management  Yes;Weight Maintenance    Intervention  Weight Management: Develop a combined nutrition and exercise program designed to reach desired caloric intake, while maintaining appropriate intake of nutrient and fiber, sodium and fats, and appropriate energy expenditure required for the weight goal.;Weight Management: Provide education and appropriate resources to help participant work on and attain dietary goals.    Admit Weight  187 lb (84.8 kg)    Expected Outcomes  Short Term: Continue to assess and modify interventions until short term  weight is achieved;Long Term: Adherence to nutrition and physical activity/exercise program aimed toward attainment of established weight goal;Weight Maintenance: Understanding of the daily nutrition guidelines, which includes 25-35% calories from fat, 7% or less cal from saturated fats, less than 232m cholesterol, less than 1.5gm of sodium, & 5 or more servings of fruits and vegetables daily;Understanding recommendations for meals to include 15-35% energy as protein, 25-35% energy from fat, 35-60% energy from carbohydrates, less than 2052mof dietary cholesterol, 20-35 gm of total fiber daily;Understanding of distribution of calorie intake throughout the day with the consumption of 4-5 meals/snacks    Diabetes  Yes    Intervention  Provide education about signs/symptoms and action to take for hypo/hyperglycemia.;Provide education about proper nutrition, including hydration, and aerobic/resistive exercise  prescription along with prescribed medications to achieve blood glucose in normal ranges: Fasting glucose 65-99 mg/dL    Expected Outcomes  Short Term: Participant verbalizes understanding of the signs/symptoms and immediate care of hyper/hypoglycemia, proper foot care and importance of medication, aerobic/resistive exercise and nutrition plan for blood glucose control.;Long Term: Attainment of HbA1C < 7%.    Hypertension  Yes    Intervention  Provide education on lifestyle modifcations including regular physical activity/exercise, weight management, moderate sodium restriction and increased consumption of fresh fruit, vegetables, and low fat dairy, alcohol moderation, and smoking cessation.;Monitor prescription use compliance.    Expected Outcomes  Short Term: Continued assessment and intervention until BP is < 140/9078mG in hypertensive participants. < 130/24m22m in hypertensive participants with diabetes, heart failure or chronic kidney disease.;Long Term: Maintenance of blood pressure at goal levels.       Core Components/Risk Factors/Patient Goals Review:    Core Components/Risk Factors/Patient Goals at Discharge (Final Review):    ITP Comments: ITP Comments    Row Name 08/20/17 1303 09/08/17 0933 09/16/17 0542       ITP Comments  Med Review completed. Initial ITP created. [facility]  Cameron [name] was complaining that his leg was hurting behind his knee.  RN looked at it and it was warm to touch and had some swelling.  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  He was encouraged to call the doctor to be seen.  He was able to exercise today and just went a little lighter on the second piece of equipment.   30 day review. Continue with ITP unless directed changes per Medical Director        Comments:

## 2017-09-17 ENCOUNTER — Other Ambulatory Visit: Payer: Self-pay

## 2017-09-17 ENCOUNTER — Ambulatory Visit (HOSPITAL_COMMUNITY): Payer: Medicare Other | Attending: Cardiology

## 2017-09-17 ENCOUNTER — Encounter: Payer: Medicare Other | Admitting: *Deleted

## 2017-09-17 DIAGNOSIS — G4733 Obstructive sleep apnea (adult) (pediatric): Secondary | ICD-10-CM | POA: Insufficient documentation

## 2017-09-17 DIAGNOSIS — I442 Atrioventricular block, complete: Secondary | ICD-10-CM | POA: Insufficient documentation

## 2017-09-17 DIAGNOSIS — E119 Type 2 diabetes mellitus without complications: Secondary | ICD-10-CM | POA: Insufficient documentation

## 2017-09-17 DIAGNOSIS — I10 Essential (primary) hypertension: Secondary | ICD-10-CM | POA: Diagnosis not present

## 2017-09-17 DIAGNOSIS — Z955 Presence of coronary angioplasty implant and graft: Secondary | ICD-10-CM

## 2017-09-17 DIAGNOSIS — Z950 Presence of cardiac pacemaker: Secondary | ICD-10-CM | POA: Diagnosis present

## 2017-09-17 DIAGNOSIS — I2510 Atherosclerotic heart disease of native coronary artery without angina pectoris: Secondary | ICD-10-CM | POA: Insufficient documentation

## 2017-09-17 DIAGNOSIS — I272 Pulmonary hypertension, unspecified: Secondary | ICD-10-CM | POA: Diagnosis not present

## 2017-09-17 DIAGNOSIS — I081 Rheumatic disorders of both mitral and tricuspid valves: Secondary | ICD-10-CM | POA: Diagnosis not present

## 2017-09-17 NOTE — Progress Notes (Signed)
Daily Session Note  Patient Details  [name] MRN: [id number] Date of Birth: 1940/09/20 Referring Provider:     Cardiac Rehab from 08/20/2017 in [facility] Cardiac and Pulmonary Rehab  Referring Provider  [name] MD      Encounter Date: 09/17/2017  Check In: Session Check In - 09/17/17 0817      Check-In   Location  [facility]-Cardiac & Pulmonary Rehab    Staff Present  Alberteen Sam, MA, RCEP, CCRP, Exercise Physiologist;Amanda Oletta Darter, BA, ACSM CEP, Exercise Physiologist;Kelly Amedeo Plenty, BS, ACSM CEP, Exercise Physiologist    Supervising physician immediately available to respond to emergencies  See telemetry face sheet for immediately available ER MD    Medication changes reported      No    Fall or balance concerns reported     No    Warm-up and Cool-down  Performed on first and last piece of equipment    Resistance Training Performed  Yes    VAD Patient?  No      Pain Assessment   Currently in Pain?  No/denies          Social History   Tobacco Use  Smoking Status Former Smoker  . Packs/day: 1.00  . Years: 5.00  . Pack years: 5.00  . Types: Cigarettes  . Last attempt to quit: 07/10/1972  . Years since quitting: 45.2  Smokeless Tobacco Former Systems developer  . Types: Chew  . Quit date: 1975    Goals Met:  Independence with exercise equipment Exercise tolerated well No report of cardiac concerns or symptoms Strength training completed today  Goals Unmet:  Not Applicable  Comments: Pt able to follow exercise prescription today without complaint.  Will continue to monitor for progression.    Dr. Emily Filbert is Medical Director for [place] and [facility].

## 2017-09-17 NOTE — Progress Notes (Unsigned)
Cameron [name]

## 2017-09-22 DIAGNOSIS — Z955 Presence of coronary angioplasty implant and graft: Secondary | ICD-10-CM | POA: Diagnosis not present

## 2017-09-22 NOTE — Progress Notes (Signed)
Daily Session Note  Patient Details  Name: ELAI VANWYK MRN: [id number] Date of Birth: 1941-01-20 Referring Provider:     Cardiac Rehab from 08/20/2017 in [facility] Cardiac and Pulmonary Rehab  Referring Provider  [name] MD      Encounter Date: 09/22/2017  Check In: Session Check In - 09/22/17 0915      Check-In   Location  [facility]-Cardiac & Pulmonary Rehab    Staff Present  Alberteen Sam, MA, RCEP, CCRP, Exercise Physiologist;Darlisha Kelm Oletta Darter, BA, ACSM CEP, Exercise Physiologist;Susanne Bice, RN, BSN, CCRP    Supervising physician immediately available to respond to emergencies  See telemetry face sheet for immediately available ER MD    Medication changes reported      No    Fall or balance concerns reported     No    Warm-up and Cool-down  Performed on first and last piece of equipment    Resistance Training Performed  Yes    VAD Patient?  No      Pain Assessment   Currently in Pain?  No/denies    Multiple Pain Sites  No          Social History   Tobacco Use  Smoking Status Former Smoker  . Packs/day: 1.00  . Years: 5.00  . Pack years: 5.00  . Types: Cigarettes  . Last attempt to quit: 07/10/1972  . Years since quitting: 45.2  Smokeless Tobacco Former Systems developer  . Types: Chew  . Quit date: 1975    Goals Met:  Independence with exercise equipment Exercise tolerated well No report of cardiac concerns or symptoms Strength training completed today  Goals Unmet:  Not Applicable  Comments: Pt able to follow exercise prescription today without complaint.  Will continue to monitor for progression.    Dr. Emily Filbert is Medical Director for [place] and [facility].

## 2017-09-24 ENCOUNTER — Encounter: Payer: Self-pay | Admitting: Nurse Practitioner

## 2017-09-24 ENCOUNTER — Encounter: Payer: Medicare Other | Attending: Cardiology

## 2017-09-24 DIAGNOSIS — Z7982 Long term (current) use of aspirin: Secondary | ICD-10-CM | POA: Insufficient documentation

## 2017-09-24 DIAGNOSIS — I110 Hypertensive heart disease with heart failure: Secondary | ICD-10-CM | POA: Insufficient documentation

## 2017-09-24 DIAGNOSIS — E119 Type 2 diabetes mellitus without complications: Secondary | ICD-10-CM | POA: Insufficient documentation

## 2017-09-24 DIAGNOSIS — I2510 Atherosclerotic heart disease of native coronary artery without angina pectoris: Secondary | ICD-10-CM | POA: Insufficient documentation

## 2017-09-24 DIAGNOSIS — E039 Hypothyroidism, unspecified: Secondary | ICD-10-CM | POA: Insufficient documentation

## 2017-09-24 DIAGNOSIS — I509 Heart failure, unspecified: Secondary | ICD-10-CM | POA: Insufficient documentation

## 2017-09-24 DIAGNOSIS — Z7984 Long term (current) use of oral hypoglycemic drugs: Secondary | ICD-10-CM | POA: Insufficient documentation

## 2017-09-24 DIAGNOSIS — G4733 Obstructive sleep apnea (adult) (pediatric): Secondary | ICD-10-CM | POA: Insufficient documentation

## 2017-09-24 DIAGNOSIS — Z7902 Long term (current) use of antithrombotics/antiplatelets: Secondary | ICD-10-CM | POA: Insufficient documentation

## 2017-09-24 DIAGNOSIS — Z79899 Other long term (current) drug therapy: Secondary | ICD-10-CM | POA: Insufficient documentation

## 2017-09-24 DIAGNOSIS — M069 Rheumatoid arthritis, unspecified: Secondary | ICD-10-CM | POA: Insufficient documentation

## 2017-09-24 DIAGNOSIS — Z955 Presence of coronary angioplasty implant and graft: Secondary | ICD-10-CM | POA: Insufficient documentation

## 2017-10-06 ENCOUNTER — Ambulatory Visit: Payer: Medicare Other | Admitting: Internal Medicine

## 2017-10-06 ENCOUNTER — Encounter: Payer: Medicare Other | Admitting: *Deleted

## 2017-10-06 ENCOUNTER — Telehealth: Payer: Self-pay | Admitting: Adult Health

## 2017-10-06 DIAGNOSIS — Z7902 Long term (current) use of antithrombotics/antiplatelets: Secondary | ICD-10-CM | POA: Diagnosis not present

## 2017-10-06 DIAGNOSIS — E039 Hypothyroidism, unspecified: Secondary | ICD-10-CM | POA: Diagnosis not present

## 2017-10-06 DIAGNOSIS — I110 Hypertensive heart disease with heart failure: Secondary | ICD-10-CM | POA: Diagnosis not present

## 2017-10-06 DIAGNOSIS — Z7984 Long term (current) use of oral hypoglycemic drugs: Secondary | ICD-10-CM | POA: Diagnosis not present

## 2017-10-06 DIAGNOSIS — Z79899 Other long term (current) drug therapy: Secondary | ICD-10-CM | POA: Diagnosis not present

## 2017-10-06 DIAGNOSIS — Z7982 Long term (current) use of aspirin: Secondary | ICD-10-CM | POA: Diagnosis not present

## 2017-10-06 DIAGNOSIS — Z955 Presence of coronary angioplasty implant and graft: Secondary | ICD-10-CM

## 2017-10-06 DIAGNOSIS — I509 Heart failure, unspecified: Secondary | ICD-10-CM | POA: Diagnosis not present

## 2017-10-06 DIAGNOSIS — M069 Rheumatoid arthritis, unspecified: Secondary | ICD-10-CM | POA: Diagnosis not present

## 2017-10-06 DIAGNOSIS — E119 Type 2 diabetes mellitus without complications: Secondary | ICD-10-CM | POA: Diagnosis not present

## 2017-10-06 DIAGNOSIS — I2510 Atherosclerotic heart disease of native coronary artery without angina pectoris: Secondary | ICD-10-CM | POA: Diagnosis not present

## 2017-10-06 DIAGNOSIS — G4733 Obstructive sleep apnea (adult) (pediatric): Secondary | ICD-10-CM | POA: Diagnosis not present

## 2017-10-06 NOTE — Telephone Encounter (Signed)
Pt has never been seen in [facility], looks like he is sch to see Dr Claiborne Billings

## 2017-10-06 NOTE — Telephone Encounter (Signed)
New Message   NP was [facility] in [place] is calling about cardiac care that the patient receives. She did not want to provide any additional information. Please call.

## 2017-10-06 NOTE — Telephone Encounter (Signed)
Returned call to [employer] NP at [place] [facility]. She states patient is overdue for colonoscopy that was scheduled previously, prior to his stents. She just wanted to check and see if he needed to wait 1 year before he could hold dual-antiplatelet therapy. Advised that this is correct, unless there are extenuating circumstances. She also wanted to confirm that patient was on ASA & Plavix as Plavix was not on their list. Explained his chart reflects these meds. No further assistance needed.

## 2017-10-06 NOTE — Progress Notes (Signed)
Daily Session Note  Patient Details  [name] MRN: [id number] Date of Birth: [birth year] Referring Provider:     Cardiac Rehab from 08/20/2017 in [facility] Cardiac and Pulmonary Rehab  Referring Provider  [name] MD      Encounter Date: 10/06/2017  Check In: Session Check In - 10/06/17 0900      Check-In   Location  [facility]-Cardiac & Pulmonary Rehab    Staff Present  Alberteen Sam, MA, RCEP, CCRP, Exercise Physiologist;Susanne Bice, RN, BSN, Lance Sell, BA, ACSM CEP, Exercise Physiologist    Supervising physician immediately available to respond to emergencies  See telemetry face sheet for immediately available ER MD    Medication changes reported      No    Fall or balance concerns reported     No    Warm-up and Cool-down  Performed on first and last piece of equipment    Resistance Training Performed  Yes    VAD Patient?  No      Pain Assessment   Currently in Pain?  No/denies          Social History   Tobacco Use  Smoking Status Former Smoker  . Packs/day: 1.00  . Years: 5.00  . Pack years: 5.00  . Types: Cigarettes  . Last attempt to quit: 07/10/1972  . Years since quitting: 45.2  Smokeless Tobacco Former Systems developer  . Types: Chew  . Quit date: 1975    Goals Met:  Independence with exercise equipment Exercise tolerated well Personal goals reviewed No report of cardiac concerns or symptoms Strength training completed today  Goals Unmet:  Not Applicable  Comments: Pt able to follow exercise prescription today without complaint.  Will continue to monitor for progression. See ITP for goal review    Dr. Emily Filbert is Medical Director for [place] and [facility].

## 2017-10-08 DIAGNOSIS — Z955 Presence of coronary angioplasty implant and graft: Secondary | ICD-10-CM

## 2017-10-08 NOTE — Progress Notes (Signed)
Daily Session Note  Patient Details  [name] MRN: [id number] Date of Birth: 1940/11/29 Referring Provider:     Cardiac Rehab from 08/20/2017 in [facility] Cardiac and Pulmonary Rehab  Referring Provider  [name] MD      Encounter Date: 10/08/2017  Check In: Session Check In - 10/08/17 1032      Check-In   Location  [facility]-Cardiac & Pulmonary Rehab    Staff Present  Gerlene Burdock, RN, BSN;Meredith Sherryll Burger, RN Vickki Hearing, BA, ACSM CEP, Exercise Physiologist    Supervising physician immediately available to respond to emergencies  See telemetry face sheet for immediately available ER MD    Medication changes reported      No    Fall or balance concerns reported     No    Warm-up and Cool-down  Performed on first and last piece of equipment    Resistance Training Performed  Yes    VAD Patient?  No      Pain Assessment   Currently in Pain?  No/denies    Multiple Pain Sites  No          Social History   Tobacco Use  Smoking Status Former Smoker  . Packs/day: 1.00  . Years: 5.00  . Pack years: 5.00  . Types: Cigarettes  . Last attempt to quit: 07/10/1972  . Years since quitting: 45.2  Smokeless Tobacco Former Systems developer  . Types: Chew  . Quit date: 1975    Goals Met:  Proper associated with RPD/PD & O2 Sat Independence with exercise equipment Exercise tolerated well Strength training completed today  Goals Unmet:  Not Applicable  Comments: Pt able to follow exercise prescription today without complaint.  Will continue to monitor for progression.    Dr. Emily Filbert is Medical Director for [place] and [facility].

## 2017-10-13 DIAGNOSIS — Z955 Presence of coronary angioplasty implant and graft: Secondary | ICD-10-CM | POA: Diagnosis not present

## 2017-10-13 NOTE — Progress Notes (Signed)
Daily Session Note  Patient Details  [name] MRN: [id number] Date of Birth: 1941/03/10 Referring Provider:     Cardiac Rehab from 08/20/2017 in [facility] Cardiac and Pulmonary Rehab  Referring Provider  [name] MD      Encounter Date: 10/13/2017  Check In: Session Check In - 10/13/17 0832      Check-In   Location  [facility]-Cardiac & Pulmonary Rehab    Staff Present  Heath Lark, RN, BSN, CCRP;Jessica Luan Pulling, MA, RCEP, CCRP, Exercise Physiologist;Murdis Flitton Oletta Darter, [place], ACSM CEP, Exercise Physiologist    Supervising physician immediately available to respond to emergencies  See telemetry face sheet for immediately available ER MD    Medication changes reported      No    Fall or balance concerns reported     No    Warm-up and Cool-down  Performed on first and last piece of equipment    Resistance Training Performed  Yes    VAD Patient?  No      Pain Assessment   Currently in Pain?  No/denies    Multiple Pain Sites  No          Social History   Tobacco Use  Smoking Status Former Smoker  . Packs/day: 1.00  . Years: 5.00  . Pack years: 5.00  . Types: Cigarettes  . Last attempt to quit: 07/10/1972  . Years since quitting: 45.2  Smokeless Tobacco Former Systems developer  . Types: Chew  . Quit date: 1975    Goals Met:  Independence with exercise equipment Exercise tolerated well No report of cardiac concerns or symptoms Strength training completed today  Goals Unmet:  Not Applicable  Comments: Pt able to follow exercise prescription today without complaint.  Will continue to monitor for progression.    Dr. Emily Filbert is Medical Director for [place] and [facility].

## 2017-10-14 ENCOUNTER — Encounter: Payer: Self-pay | Admitting: *Deleted

## 2017-10-14 DIAGNOSIS — Z955 Presence of coronary angioplasty implant and graft: Secondary | ICD-10-CM

## 2017-10-14 NOTE — Progress Notes (Signed)
Cardiac Individual Treatment Plan  Patient Details  [name] MRN: [id number] Date of Birth: 28-Oct-1940 Referring Provider:     Cardiac Rehab from 08/20/2017 in [facility] Cardiac and Pulmonary Rehab  Referring Provider  [name] MD      Initial Encounter Date:    Cardiac Rehab from 08/20/2017 in [facility] Cardiac and Pulmonary Rehab  Date  08/20/17  Referring Provider  [name] MD      Visit Diagnosis: Status post coronary artery stent placement  Patient's Home Medications on Admission:  Current Outpatient Medications:  .  aspirin EC 81 MG tablet, Take 81 mg by mouth daily., Disp: , Rfl:  .  atorvastatin (LIPITOR) 40 MG tablet, Take 1 tablet (40 mg total) by mouth daily at 6 PM., Disp: 30 tablet, Rfl: 6 .  BLACK PEPPER-TURMERIC PO, Take 1 capsule by mouth 2 (two) times daily., Disp: , Rfl:  .  clopidogrel (PLAVIX) 75 MG tablet, Take 1 tablet (75 mg total) by mouth daily with breakfast., Disp: 30 tablet, Rfl: 6 .  Coenzyme Q10 (CO Q 10) 100 MG CAPS, Take 100 mg by mouth daily., Disp: , Rfl:  .  glimepiride (AMARYL) 2 MG tablet, Take 2 mg by mouth daily., Disp: , Rfl:  .  leflunomide (ARAVA) 10 MG tablet, Take 1 tablet by mouth daily., Disp: , Rfl:  .  lisinopril (PRINIVIL,ZESTRIL) 10 MG tablet, Take 1 tablet (10 mg total) by mouth daily., Disp: 90 tablet, Rfl: 3 .  metFORMIN (GLUCOPHAGE) 500 MG tablet, Take 500 mg by mouth 2 (two) times daily., Disp: , Rfl:  .  mirabegron ER (MYRBETRIQ) 50 MG TB24 tablet, Take 1 tablet (50 mg total) by mouth daily., Disp: 30 tablet, Rfl: 11 .  nebivolol (BYSTOLIC) 5 MG tablet, Take 1 tablet (5 mg total) by mouth daily., Disp: 30 tablet, Rfl: 6 .  nitroGLYCERIN (NITROSTAT) 0.4 MG SL tablet, DISSOLVE 1 TABLET UNDER THE TONGUE EVERY 5 MINUTES AS NEEDED FOR CHEST PAIN., Disp: 75 tablet, Rfl: 1 .  Omega-3 Fatty Acids (OMEGA 3 PO), Take 1 tablet 2 (two) times daily by mouth., Disp: , Rfl:  .  Red Yeast Rice Extract (RED YEAST RICE PO), Take 1  capsule by mouth daily., Disp: , Rfl:  .  thyroid (ARMOUR) 90 MG tablet, Take 90 mg by mouth every morning. , Disp: , Rfl:  .  TURMERIC CURCUMIN PO, Take 1 capsule by mouth daily., Disp: , Rfl:   Past Medical History: Past Medical History:  Diagnosis Date  . BPH (benign prostatic hypertrophy)   . CAD (coronary artery disease) 12/2015   Cath by Dr [name] reveals distal and small vessel CAD.  Medical therapy advised.  . CHF (congestive heart failure) ([place])   . 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 History of blood transfusion 1968   "probably; related to getting [place]  . History of kidney stones   . Hypertension   . Hypothyroidism   . OSA on CPAP   . 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 Rheumatoid arthritis ([place])    "hands" (08/11/2017)  . Type II diabetes mellitus (HCC)     Tobacco Use: Social History   Tobacco Use  Smoking Status Former Smoker  . Packs/day: 1.00  . Years: 5.00  . Pack years: 5.00  . Types: Cigarettes  . Last attempt to quit: 07/10/1972  . Years since quitting: 45.2  Smokeless Tobacco Former Systems developer  . Types: Chew  .  Quit date: 1975    Labs: Recent Merchant [employer] officer for ITP Cardiac and Pulmonary Rehab Latest Ref Rng & Units 08/11/2017 08/11/2017   HCO3 20.0 - 28.0 mmol/L 24.6 24.5   TCO2 22 - 32 mmol/L 26 26   ACIDBASEDEF 0.0 - 2.0 mmol/L 2.0 2.0   O2SAT % 62.0 63.0       Exercise Target Goals:    Exercise Program Goal: Individual exercise prescription set using results from initial 6 min walk test and THRR while considering  patient's activity barriers and safety.   Exercise Prescription Goal: Initial exercise prescription builds to 30-45 minutes a day of aerobic activity, 2-3 days per week.  Home exercise guidelines will be given to patient during program as part of exercise prescription that the participant will acknowledge.  Activity Barriers & Risk Stratification: Activity Barriers & Cardiac Risk  Stratification - 08/20/17 1317      Activity Barriers & Cardiac Risk Stratification   Activity Barriers  Shortness of Breath;Deconditioning    Cardiac Risk Stratification  Moderate       6 Minute Walk: 6 Minute Walk    Row Name 08/20/17 1322         6 Minute Walk   Phase  Initial     Distance  1450 feet     Walk Time  6 minutes     # of Rest Breaks  0     MPH  2.75     METS  3.23     RPE  11     VO2 Peak  11.3     Symptoms  No     Resting HR  61 bpm     Resting BP  126/72     Resting Oxygen Saturation   100 %     Exercise Oxygen Saturation  during 6 min walk  100 %     Max Ex. HR  118 bpm     Max Ex. BP  174/84     2 Minute Post BP  124/70        Oxygen Initial Assessment:   Oxygen Re-Evaluation:   Oxygen Discharge (Final Oxygen Re-Evaluation):   Initial Exercise Prescription: Initial Exercise Prescription - 08/20/17 1300      Date of Initial Exercise RX and Referring Provider   Date  08/20/17    Referring Provider  [name] MD      Treadmill   MPH  2.7    Grade  0.5    Minutes  15    METs  3.25      Recumbant Bike   Level  2    RPM  50    Watts  33    Minutes  15    METs  3.2      T5 Nustep   Level  2    SPM  80    Minutes  15    METs  3.2      Prescription Details   Frequency (times per week)  2    Duration  Progress to 30 minutes of continuous aerobic without signs/symptoms of physical distress      Intensity   THRR 40-80% of Max Heartrate  94-127    Ratings of Perceived Exertion  11-13    Perceived Dyspnea  0-4      Progression   Progression  Continue to progress workloads to maintain intensity without signs/symptoms of physical distress.      Resistance Training  Training Prescription  Yes    Weight  4 lbs    Reps  10-15       Perform Capillary Blood Glucose checks as needed.  Exercise Prescription Changes: Exercise Prescription Changes    Row Name 08/20/17 1300 09/08/17 1500 09/15/17 0900 09/22/17 1300 10/06/17  1100     Response to Exercise   Blood Pressure (Admit)  126/72  102/64  -  114/60  128/68   Blood Pressure (Exercise)  174/84  134/70  -  124/68  178/74   Blood Pressure (Exit)  124/70  120/64  -  122/64  146/54   Heart Rate (Admit)  61 bpm  61 bpm  -  86 bpm  75 bpm   Heart Rate (Exercise)  118 bpm  121 bpm  -  121 bpm  129 bpm   Heart Rate (Exit)  69 bpm  60 bpm  -  62 bpm  77 bpm   Oxygen Saturation (Admit)  100 %  -  -  -  -   Oxygen Saturation (Exercise)  100 %  -  -  -  -   Rating of Perceived Exertion (Exercise)  11  12  -  12  13   Symptoms  none  none  -  none  none   Comments  walk test results  -  -  -  -   Duration  -  Continue with 30 min of aerobic exercise without signs/symptoms of physical distress.  -  Continue with 30 min of aerobic exercise without signs/symptoms of physical distress.  Continue with 30 min of aerobic exercise without signs/symptoms of physical distress.   Intensity  -  THRR unchanged  -  THRR unchanged  THRR unchanged     Progression   Progression  -  Continue to progress workloads to maintain intensity without signs/symptoms of physical distress.  -  Continue to progress workloads to maintain intensity without signs/symptoms of physical distress.  Continue to progress workloads to maintain intensity without signs/symptoms of physical distress.   Average METs  -  2.98  -  3.05  2.88     Resistance Training   Training Prescription  -  Yes  -  Yes  Yes   Weight  -  4 lbs  -  5 lbs  5 lbs   Reps  -  10-15  -  10-15  10-15     Interval Training   Interval Training  -  No  -  No  No     Treadmill   MPH  -  2.7  -  2.7  2.7   Grade  -  0.5  -  0.5  0.5   Minutes  -  15  -  15  15   METs  -  3.25  -  3.25  3.25     Recumbant Bike   Level  -  2  -  5  -   Watts  -  33  -  34  -   Minutes  -  15  -  15  -   METs  -  3.3  -  3.3  -     T5 Nustep   Level  -  3  -  4  4   Minutes  -  15  -  15  15   METs  -  2.4  -  2.4  2.5     Home  Exercise Plan    Plans to continue exercise at  -  -  [employer] (comment) classes at [facility] (comment) classes at [facility] (comment) classes at [place] 3 additional days to program exercise sessions.  Add 3 additional days to program exercise sessions.  Add 3 additional days to program exercise sessions.   Initial Home Exercises Provided  -  -  09/15/17  09/15/17  09/15/17      Exercise Comments: Exercise Comments    Row Name 08/27/17 [phone] 09/08/17 0933         Exercise Comments  First full day of exercise!  Patient was oriented to gym and equipment including functions, settings, policies, and procedures.  Patient's individual exercise prescription and treatment plan were reviewed.  All starting workloads were established based on the results of the 6 minute walk test done at initial orientation visit.  The plan for exercise progression was also introduced and progression will be customized based on patient's performance and goals.  [name] was complaining that his leg was hurting behind his knee.  RN looked at it and it was warm to touch and had some swelling.  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  He was encouraged to call the doctor to be seen.  He was able to exercise today and just went a little lighter on the second piece of equipment.          Exercise Goals and Review: Exercise Goals    Row Name 08/20/17 1326             Exercise Goals   Increase Physical Activity  Yes       Intervention  Provide advice, education, support and counseling about physical activity/exercise needs.;Develop an individualized exercise prescription for aerobic and resistive training based on initial evaluation findings, risk stratification, comorbidities and participant's personal goals.       Expected Outcomes  Short Term: Attend rehab on a regular basis to  increase amount of physical activity.;Long Term: Add in home exercise to make exercise part of routine and to increase amount of physical activity.;Long Term: Exercising regularly at least 3-5 days a week.       Increase Strength and Stamina  Yes       Intervention  Provide advice, education, support and counseling about physical activity/exercise needs.;Develop an individualized exercise prescription for aerobic and resistive training based on initial evaluation findings, risk stratification, comorbidities and participant's personal goals.       Expected Outcomes  Short Term: Increase workloads from initial exercise prescription for resistance, speed, and METs.;Short Term: Perform resistance training exercises routinely during rehab and add in resistance training at home;Long Term: Improve cardiorespiratory fitness, muscular endurance and strength as measured by increased METs and functional capacity (6MWT)       Able to understand and use rate of perceived exertion (RPE) scale  Yes       Intervention  Provide education and explanation on how to use RPE scale       Expected Outcomes  Short Term: Able to use RPE daily in rehab to express subjective intensity level;Long Term:  Able to use RPE to guide intensity level when exercising independently       Able to understand and use Dyspnea scale  Yes       Intervention  Provide education  and explanation on how to use Dyspnea scale       Expected Outcomes  Short Term: Able to use Dyspnea scale daily in rehab to express subjective sense of shortness of breath during exertion;Long Term: Able to use Dyspnea scale to guide intensity level when exercising independently       Knowledge and understanding of Target Heart Rate Range (THRR)  Yes       Intervention  Provide education and explanation of THRR including how the numbers were predicted and where they are located for reference       Expected Outcomes  Short Term: Able to state/look up THRR;Long Term: Able to  use THRR to govern intensity when exercising independently;Short Term: Able to use daily as guideline for intensity in rehab       Able to check pulse independently  Yes       Intervention  Provide education and demonstration on how to check pulse in carotid and radial arteries.;Review the importance of being able to check your own pulse for safety during independent exercise       Expected Outcomes  Short Term: Able to explain why pulse checking is important during independent exercise;Long Term: Able to check pulse independently and accurately       Understanding of Exercise Prescription  Yes       Intervention  Provide education, explanation, and written materials on patient's individual exercise prescription       Expected Outcomes  Short Term: Able to explain program exercise prescription;Long Term: Able to explain home exercise prescription to exercise independently          Exercise Goals Re-Evaluation : Exercise Goals Re-Evaluation    Row Name 08/27/17 [phone] 09/08/17 1520 09/15/17 0939 09/22/17 1258 10/06/17 0839     Exercise Goal Re-Evaluation   Exercise Goals Review  Increase Physical Activity;Knowledge and understanding of Target Heart Rate Range (THRR);Able to understand and use rate of perceived exertion (RPE) scale;Understanding of Exercise Prescription  Increase Physical Activity;Understanding of Exercise Prescription;Increase Strength and Stamina  Increase Physical Activity;Understanding of Exercise Prescription;Increase Strength and Stamina;Able to understand and use rate of perceived exertion (RPE) scale;Knowledge and understanding of Target Heart Rate Range (THRR);Able to check pulse independently  Increase Physical Activity;Understanding of Exercise Prescription;Increase Strength and Stamina  Increase Physical Activity;Understanding of Exercise Prescription;Increase Strength and Stamina   Comments  Reviewed RPE scale, THR and program prescription with pt today.  Pt voiced  understanding and was given a copy of goals to take home.   [name] is off to a good start in rehab.  He is now on level 3 on the T5 NuStep.  We will continue to monitor his progression.   Reviewed home exercise with pt today.  Pt plans to continue to attend the exercise classes the community center in [place] on his off days.  Reviewed THR, pulse, RPE, sign and symptoms, NTG use, and when to call 911 or MD.  Also discussed weather considerations and indoor options.  Pt voiced understanding.  [name] has been doing well in rehab.  He still needs help figuring out how to get on and off and which piece of equipment to go to.  He is up to level 4 on the NuStep.  We will continue to work with him and monitor his progression.   [name] continues to do well in rehab.  He just returned from being out sick.  He is feeling stronger, but he still wants more on his endurance.   He  has been doing his home exercise. Yesterday, they walked a mile and lifted weights.  He could tell a difference after missing classes.    Expected Outcomes  Short: Use RPE daily to regulate intensity.  Long: Follow program prescription in THR.  Short: Review home exercise.  Long: Continue to increase physical acitvity.   Short: Continue to attend exercise classes on off days.  Long: Continue to increase strength and stamina.   Short: Figure out our routine.  Long: Continue to exercise on his own at home.   Short: Continue to increase workloads.  Long: Continue to exercise at home.       Discharge Exercise Prescription (Final Exercise Prescription Changes): Exercise Prescription Changes - 10/06/17 1100      Response to Exercise   Blood Pressure (Admit)  128/68    Blood Pressure (Exercise)  178/74    Blood Pressure (Exit)  146/54    Heart Rate (Admit)  75 bpm    Heart Rate (Exercise)  129 bpm    Heart Rate (Exit)  77 bpm    Rating of Perceived Exertion (Exercise)  13    Symptoms  none    Duration  Continue with 30 min of aerobic exercise  without signs/symptoms of physical distress.    Intensity  THRR unchanged      Progression   Progression  Continue to progress workloads to maintain intensity without signs/symptoms of physical distress.    Average METs  2.88      Resistance Training   Training Prescription  Yes    Weight  5 lbs    Reps  10-15      Interval Training   Interval Training  No      Treadmill   MPH  2.7    Grade  0.5    Minutes  15    METs  3.25      T5 Nustep   Level  4    Minutes  15    METs  2.5      Home Exercise Plan   Plans to continue exercise at  [employer] (comment) classes at [facility]    Frequency  Add 3 additional days to program exercise sessions.    Initial Home Exercises Provided  09/15/17       Nutrition:  Target Goals: Understanding of nutrition guidelines, daily intake of sodium <1551m, cholesterol <2052m calories 30% from fat and 7% or less from saturated fats, daily to have 5 or more servings of fruits and vegetables.  Biometrics: Pre Biometrics - 08/20/17 1326      Pre Biometrics   Height  5' 7.1" (1.704 m)    Weight  187 lb 8 oz (85 kg)    Waist Circumference  39 inches    Hip Circumference  38 inches    Waist to Hip Ratio  1.03 %    BMI (Calculated)  29.29    Single Leg Stand  30 seconds        Nutrition Therapy Plan and Nutrition Goals: Nutrition Therapy & Goals - 09/03/17 1042      Nutrition Therapy   Diet  DM, DASH    Drug/Food Interactions  Statins/Certain Fruits    Protein (specify units)  11oz    Fiber  35 grams    Whole Grain Foods  3 servings    Saturated Fats  13 max. grams    Fruits and Vegetables  6 servings/day 8 ideal    Sodium  1500 grams  Personal Nutrition Goals   Nutrition Goal  Because you eat out frequently, look for ways to reduce added salt, such as asking for no salt, removing the skin from meats, and choosing non-fried/ greasy food    Personal Goal #2  Choose evening snacks that include both a  carbohydrate source and a protein source to manage blood glucose control    Personal Goal #3  Avoid grapefruit while taking Lipitor    Comments  He limits intake of processed foods, fried food, soda, red meat and white starches. His wife provides whole grains and vegetables frequently      Intervention Plan   Intervention  Prescribe, educate and counsel regarding individualized specific dietary modifications aiming towards targeted core components such as weight, hypertension, lipid management, diabetes, heart failure and other comorbidities.    Expected Outcomes  Short Term Goal: Understand basic principles of dietary content, such as calories, fat, sodium, cholesterol and nutrients.;Long Term Goal: Adherence to prescribed nutrition plan.;Short Term Goal: A plan has been developed with personal nutrition goals set during dietitian appointment.       Nutrition Assessments: Nutrition Assessments - 08/27/17 1304      MEDFICTS Scores   Pre Score  29       Nutrition Goals Re-Evaluation: Nutrition Goals Re-Evaluation    Row Name 09/03/17 1052 09/03/17 1053 10/06/17 0844         Goals   Nutrition Goal  Because you eat out frequently, continue to make nutrient-dense choices and look for ways to reduce added salt. For example, asking for no salt, removing the skin from meats, choosing non-fried/ greasy food  Choose evening snacks that include both a carbohydrate and a protein source for better blood glucose control  Choose evening snacks that include both a carbohydrate and a protein source for better blood glucose control,, More protien     Comment  He and his wife eat out frequently but he does try to be conscious of the choices he makes  He currently does not always choose a protein source at snack times but does choose lower salt/calorie options like home-popped popcorn  [name] has been getting more protein with nabs and peanut butter snacks more frequently.  He will need to be careful about his  salt with those.   Choosing home popped popcorn.  He has tried some yogurt and cheeses.  Continue to work on [employer].  He has cut back on portion control.       Expected Outcome  He will choose vegetables, lean proteins, complex carbohydrates, and limit salt when eating out  He will add a protein source to his evening snacks, such as yogurt, cheese or nuts  Short: Continue to find protein snacks.  Long: Continue to work on portion control.        Personal Goal #2 Re-Evaluation   Personal Goal #2  -  Avoid grapefruit while on Lipitor  -        Nutrition Goals Discharge (Final Nutrition Goals Re-Evaluation): Nutrition Goals Re-Evaluation - 10/06/17 0844      Goals   Nutrition Goal  Choose evening snacks that include both a carbohydrate and a protein source for better blood glucose control,, More protien    Comment  [name] has been getting more protein with nabs and peanut butter snacks more frequently.  He will need to be careful about his salt with those.   Choosing home popped popcorn.  He has tried some yogurt and cheeses.  Continue  to work on [employer].  He has cut back on portion control.      Expected Outcome  Short: Continue to find protein snacks.  Long: Continue to work on portion control.        Psychosocial: Target Goals: Acknowledge presence or absence of significant depression and/or stress, maximize coping skills, provide positive support system. Participant is able to verbalize types and ability to use techniques and skills needed for reducing stress and depression.   Initial Review & Psychosocial Screening: Initial Psych Review & Screening - 08/20/17 1309      Initial Review   Current issues with  None Identified did not reveal any to staff during orientation      [place]?  Yes family      Screening Interventions   Interventions  Encouraged to exercise;Program counselor consult;To provide support and resources with  identified psychosocial needs;Provide feedback about the scores to participant    Expected Outcomes  Short Term goal: Utilizing psychosocial counselor, staff and physician to assist with identification of specific Stressors or current issues interfering with healing process. Setting desired goal for each stressor or current issue identified.;Long Term Goal: Stressors or current issues are controlled or eliminated.;Short Term goal: Identification and review with participant of any Quality of Life or Depression concerns found by scoring the questionnaire.;Long Term goal: The participant improves quality of Life and PHQ9 Scores as seen by post scores and/or verbalization of changes       Quality of Life Scores:  Quality of Life - 08/20/17 1316      Quality of Life Scores   Health/Function Pre  27.27 %    Socioeconomic Pre  30 %    Psych/Spiritual Pre  30 %    Family Pre  30 %    GLOBAL Pre  28.83 %      Scores of 19 and below usually indicate a poorer quality of life in these areas.  A difference of  2-3 points is a clinically meaningful difference.  A difference of 2-3 points in the total score of the Quality of Life Index has been associated with significant improvement in overall quality of life, self-image, physical symptoms, and general health in studies assessing change in quality of life.  PHQ-9: Recent Review Flowsheet Data    Depression screen [facility] 2/9 08/20/2017   Decreased Interest 0   Down, Depressed, Hopeless 0   PHQ - 2 Score 0   Altered sleeping 0   Tired, decreased energy 0   Change in appetite 0   Feeling bad or failure about yourself  0   Trouble concentrating 0   Moving slowly or fidgety/restless 0   Suicidal thoughts 0   PHQ-9 Score 0     Interpretation of Total Score  Total Score Depression Severity:  1-4 = Minimal depression, 5-9 = Mild depression, 10-14 = Moderate depression, 15-19 = Moderately severe depression, 20-27 = Severe depression   Psychosocial  Evaluation and Intervention: Psychosocial Evaluation - 09/17/17 0941      Psychosocial Evaluation & Interventions   Interventions  Encouraged to exercise with the program and follow exercise prescription    Comments  Counselor met with Mr. [facility]) today for initial psychosocial evaluation. He is a 78 year old who had two stents done in his heart in the past 4 weeks.  He has a strong support system with a spouse of 4 years; friends and active involvement in his local church.  [name] is struggling with a baker's cyst on his right knee recently and has had PT for that.  He sleeps well with the use of a CPAP and has a good appetite.  [name] denies a history of depression or anxiety or any current symptoms.  He is typically in a positive mood.  Aren has minimal stress in his life other than his health.  He has goals to lose weight and increase his stamina and strength while in this program.  He will be followed by staff.      Expected Outcomes  Short:  Wilberto will meet with the dietician to address his weight loss goal.  Long:  He will exercise consistently to regain his stamina and strength.    Continue Psychosocial Services   Follow up required by staff       Psychosocial Re-Evaluation: Psychosocial Re-Evaluation    [place] Name 10/06/17 [phone]             Psychosocial Re-Evaluation   Current issues with  None Identified       Comments  [name] is doing well in rehab and at home.  He conitnues to do well mentally.  He is compliant with his CPAP use and sleeps well.  He has found his stamina is coming back by coming to exercise.  He would like to continue to try to lose more weight.         Expected Outcomes  Short: Continue to exercise daily.  Long: Continue to stay postive.           Psychosocial Discharge (Final Psychosocial Re-Evaluation): Psychosocial Re-Evaluation - 10/06/17 0851      Psychosocial Re-Evaluation   Current issues with  None Identified    Comments  [name] is doing well in  rehab and at home.  He conitnues to do well mentally.  He is compliant with his CPAP use and sleeps well.  He has found his stamina is coming back by coming to exercise.  He would like to continue to try to lose more weight.      Expected Outcomes  Short: Continue to exercise daily.  Long: Continue to stay postive.        Vocational Rehabilitation: Provide vocational rehab assistance to qualifying candidates.   Vocational Rehab Evaluation & Intervention: Vocational Rehab - 08/20/17 1317      Initial Vocational Rehab Evaluation & Intervention   Assessment shows need for Vocational Rehabilitation  No       Education: Education Goals: Education classes will be provided on a variety of topics geared toward better understanding of heart health and risk factor modification. Participant will state understanding/return demonstration of topics presented as noted by education test scores.  Learning Barriers/Preferences: Learning Barriers/Preferences - 08/20/17 1316      Learning Barriers/Preferences   Learning Barriers  None    Learning Preferences  None       Education Topics:  AED/CPR: - Group verbal and written instruction with the use of models to demonstrate the basic use of the AED with the basic ABC's of resuscitation.   Cardiac Rehab from 10/13/2017 in [facility] Cardiac and Pulmonary Rehab  Date  08/27/17  Educator  CE  Instruction Review Code  1- Verbalizes Understanding      General Nutrition Guidelines/Fats and Fiber: -Group instruction provided by verbal, written material, models and posters to present the general guidelines for heart healthy nutrition. Gives an explanation and review of dietary fats and fiber.   Cardiac Rehab from 10/13/2017  in [facility] Cardiac and Pulmonary Rehab  Date  10/13/17  Educator  PI  Instruction Review Code  1- Verbalizes Understanding      Controlling Sodium/Reading Food Labels: -Group verbal and written material supporting the discussion of  sodium use in heart healthy nutrition. Review and explanation with models, verbal and written materials for utilization of the food label.   Exercise Physiology & General Exercise Guidelines: - Group verbal and written instruction with models to review the exercise physiology of the cardiovascular system and associated critical values. Provides general exercise guidelines with specific guidelines to those with heart or lung disease.    Cardiac Rehab from 10/13/2017 in [facility] Cardiac and Pulmonary Rehab  Date  09/03/17  Educator  [facility]  Instruction Review Code  1- Verbalizes Understanding      Aerobic Exercise & Resistance Training: - Gives group verbal and written instruction on the various components of exercise. Focuses on aerobic and resistive training programs and the benefits of this training and how to safely progress through these programs..   Cardiac Rehab from 10/13/2017 in [facility] Cardiac and Pulmonary Rehab  Date  09/08/17  Educator  AS  Instruction Review Code  1- Verbalizes Understanding      Flexibility, Balance, Mind/Body Relaxation: Provides group verbal/written instruction on the benefits of flexibility and balance training, including mind/body exercise modes such as yoga, pilates and tai chi.  Demonstration and skill practice provided.   Cardiac Rehab from 10/13/2017 in [facility] Cardiac and Pulmonary Rehab  Date  09/15/17  Educator  AS  Instruction Review Code  1- Verbalizes Understanding      Stress and Anxiety: - Provides group verbal and written instruction about the health risks of elevated stress and causes of high stress.  Discuss the correlation between heart/lung disease and anxiety and treatment options. Review healthy ways to manage with stress and anxiety.   Cardiac Rehab from 10/13/2017 in [facility] Cardiac and Pulmonary Rehab  Date  09/22/17  Educator  [facility]  Instruction Review Code  1- Verbalizes Understanding      Depression: - Provides group verbal and written  instruction on the correlation between heart/lung disease and depressed mood, treatment options, and the stigmas associated with seeking treatment.   Cardiac Rehab from 10/13/2017 in [facility] Cardiac and Pulmonary Rehab  Date  09/10/17  Educator  [facility]  Instruction Review Code  1- Verbalizes Understanding      Anatomy & Physiology of the Heart: - Group verbal and written instruction and models provide basic cardiac anatomy and physiology, with the coronary electrical and arterial systems. Review of Valvular disease and Heart Failure   Cardiac Procedures: - Group verbal and written instruction to review commonly prescribed medications for heart disease. Reviews the medication, class of the drug, and side effects. Includes the steps to properly store meds and maintain the prescription regimen. (beta blockers and nitrates)   Cardiac Rehab from 10/13/2017 in [facility] Cardiac and Pulmonary Rehab  Date  10/08/17  Educator  CE  Instruction Review Code  1- Verbalizes Understanding      Cardiac Medications I: - Group verbal and written instruction to review commonly prescribed medications for heart disease. Reviews the medication, class of the drug, and side effects. Includes the steps to properly store meds and maintain the prescription regimen.   Cardiac Medications II: -Group verbal and written instruction to review commonly prescribed medications for heart disease. Reviews the medication, class of the drug, and side effects. (all other drug classes)   Cardiac Rehab from  10/13/2017 in [facility] Cardiac and Pulmonary Rehab  Date  09/17/17  Educator  CE  Instruction Review Code  1- Verbalizes Understanding       Go Sex-Intimacy & Heart Disease, Get SMART - Goal Setting: - Group verbal and written instruction through game format to discuss heart disease and the return to sexual intimacy. Provides group verbal and written material to discuss and apply goal setting through the application of the S.M.A.R.T.  Method.   Cardiac Rehab from 10/13/2017 in [facility] Cardiac and Pulmonary Rehab  Date  10/08/17  Educator  CE  Instruction Review Code  1- Verbalizes Understanding      Other Matters of the Heart: - Provides group verbal, written materials and models to describe Stable Angina and Peripheral Artery. Includes description of the disease process and treatment options available to the cardiac patient.   Exercise & Equipment Safety: - Individual verbal instruction and demonstration of equipment use and safety with use of the equipment.   Cardiac Rehab from 10/13/2017 in [facility] Cardiac and Pulmonary Rehab  Date  08/20/17  Educator  [facility]  Instruction Review Code  1- Verbalizes Understanding      Infection Prevention: - Provides verbal and written material to individual with discussion of infection control including proper hand washing and proper equipment cleaning during exercise session.   Cardiac Rehab from 10/13/2017 in [facility] Cardiac and Pulmonary Rehab  Date  08/20/17  Educator  [facility]  Instruction Review Code  1- Verbalizes Understanding      Falls Prevention: - Provides verbal and written material to individual with discussion of falls prevention and safety.   Cardiac Rehab from 10/13/2017 in [facility] Cardiac and Pulmonary Rehab  Date  08/20/17  Educator  [facility]  Instruction Review Code  1- Verbalizes Understanding      Diabetes: - Individual verbal and written instruction to review signs/symptoms of diabetes, desired ranges of glucose level fasting, after meals and with exercise. Acknowledge that pre and post exercise glucose checks will be done for 3 sessions at entry of program.   Cardiac Rehab from 10/13/2017 in [facility] Cardiac and Pulmonary Rehab  Date  08/20/17  Educator  [facility]  Instruction Review Code  1- Verbalizes Understanding      Know Your Numbers and Risk Factors: -Group verbal and written instruction about important numbers in your health.  Discussion of what are risk factors and how  they play a role in the disease process.  Review of Cholesterol, Blood Pressure, Diabetes, and BMI and the role they play in your overall health.   Cardiac Rehab from 10/13/2017 in [facility] Cardiac and Pulmonary Rehab  Date  09/17/17  Educator  CE  Instruction Review Code  1- Verbalizes Understanding      Sleep Hygiene: -Provides group verbal and written instruction about how sleep can affect your health.  Define sleep hygiene, discuss sleep cycles and impact of sleep habits. Review good sleep hygiene tips.    Cardiac Rehab from 10/13/2017 in [facility] Cardiac and Pulmonary Rehab  Date  10/06/17  Educator  [facility]  Instruction Review Code  1- Verbalizes Understanding      Other: -Provides group and verbal instruction on various topics (see comments)   Knowledge Questionnaire Score: Knowledge Questionnaire Score - 08/20/17 1316      Knowledge Questionnaire Score   Pre Score  26/28 correct answers reviewed with [name]       Core Components/Risk Factors/Patient Goals at Admission: Personal Goals and Risk Factors at Admission - 08/20/17 1305  Core Components/Risk Factors/Patient Goals on Admission    Weight Management  Yes;Weight Maintenance    Intervention  Weight Management: Develop a combined nutrition and exercise program designed to reach desired caloric intake, while maintaining appropriate intake of nutrient and fiber, sodium and fats, and appropriate energy expenditure required for the weight goal.;Weight Management: Provide education and appropriate resources to help participant work on and attain dietary goals.    Admit Weight  187 lb (84.8 kg)    Expected Outcomes  Short Term: Continue to assess and modify interventions until short term weight is achieved;Long Term: Adherence to nutrition and physical activity/exercise program aimed toward attainment of established weight goal;Weight Maintenance: Understanding of the daily nutrition guidelines, which includes 25-35% calories from fat,  7% or less cal from saturated fats, less than 218m cholesterol, less than 1.5gm of sodium, & 5 or more servings of fruits and vegetables daily;Understanding recommendations for meals to include 15-35% energy as protein, 25-35% energy from fat, 35-60% energy from carbohydrates, less than 2084mof dietary cholesterol, 20-35 gm of total fiber daily;Understanding of distribution of calorie intake throughout the day with the consumption of 4-5 meals/snacks    Diabetes  Yes    Intervention  Provide education about signs/symptoms and action to take for hypo/hyperglycemia.;Provide education about proper nutrition, including hydration, and aerobic/resistive exercise prescription along with prescribed medications to achieve blood glucose in normal ranges: Fasting glucose 65-99 mg/dL    Expected Outcomes  Short Term: Participant verbalizes understanding of the signs/symptoms and immediate care of hyper/hypoglycemia, proper foot care and importance of medication, aerobic/resistive exercise and nutrition plan for blood glucose control.;Long Term: Attainment of HbA1C < 7%.    Hypertension  Yes    Intervention  Provide education on lifestyle modifcations including regular physical activity/exercise, weight management, moderate sodium restriction and increased consumption of fresh fruit, vegetables, and low fat dairy, alcohol moderation, and smoking cessation.;Monitor prescription use compliance.    Expected Outcomes  Short Term: Continued assessment and intervention until BP is < 140/9056mG in hypertensive participants. < 130/34m9m in hypertensive participants with diabetes, heart failure or chronic kidney disease.;Long Term: Maintenance of blood pressure at goal levels.       Core Components/Risk Factors/Patient Goals Review:  Goals and Risk Factor Review    Row Name 10/06/17 [phone]         Core Components/Risk Factors/Patient Goals Review   Personal Goals Review  Weight  Management/Obesity;Diabetes;Hypertension;Lipids       Review  His weight was up today after missing last week.  He would like to get it back down.  His blood sugars have been good.  He used to check daily in the morning, he is going to try to get back to checking more frequently.  Blood pressures have been good in class.  He has not been checking it at home.  He has a cuff and is going to bring it in to check against what we get in here and then start checking more at home.  He feels that his medications are working well for him.        Expected Outcomes  Short: Bring in cuff and start checking more frequently at home. Long: Continue to work on weight loss.           Core Components/Risk Factors/Patient Goals at Discharge (Final Review):  Goals and Risk Factor Review - 10/06/17 0842      Core Components/Risk Factors/Patient Goals Review   Personal Goals Review  Weight Management/Obesity;Diabetes;Hypertension;Lipids    Review  His weight was up today after missing last week.  He would like to get it back down.  His blood sugars have been good.  He used to check daily in the morning, he is going to try to get back to checking more frequently.  Blood pressures have been good in class.  He has not been checking it at home.  He has a cuff and is going to bring it in to check against what we get in here and then start checking more at home.  He feels that his medications are working well for him.     Expected Outcomes  Short: Bring in cuff and start checking more frequently at home. Long: Continue to work on weight loss.        ITP Comments: ITP Comments    Row Name 08/20/17 1303 09/08/17 0933 09/16/17 0542 10/14/17 0558     ITP Comments  Med Review completed. Initial ITP created. Diagnosis can be found in [facility] Encounter 08/11/17  [name] was complaining that his leg was hurting behind his knee.  RN looked at it and it was warm to touch and had some swelling.  He has a history of Baker's Cyst and looks  like a new one may be beginning.  He was encouraged to call the doctor to be seen.  He was able to exercise today and just went a little lighter on the second piece of equipment.   30 day review. Continue with ITP unless directed changes per Medical Director  30 day review. Continue with ITP unless directed changes per Medical Director       Comments:

## 2017-10-20 ENCOUNTER — Encounter: Payer: Medicare Other | Admitting: *Deleted

## 2017-10-20 DIAGNOSIS — Z955 Presence of coronary angioplasty implant and graft: Secondary | ICD-10-CM

## 2017-10-20 NOTE — Progress Notes (Signed)
Daily Session Note  Patient Details  [name] MRN: [id number] Date of Birth: 09/30/1940 Referring Provider:     Cardiac Rehab from 08/20/2017 in [facility] Cardiac and Pulmonary Rehab  Referring Provider  [name] MD      Encounter Date: 10/20/2017  Check In: Session Check In - 10/20/17 0821      Check-In   Location  [facility]-Cardiac & Pulmonary Rehab    Staff Present  Heath Lark, RN, BSN, CCRP;Francis Yardley Luan Pulling, MA, RCEP, CCRP, Exercise Physiologist;Amanda Oletta Darter, [place], ACSM CEP, Exercise Physiologist    Supervising physician immediately available to respond to emergencies  See telemetry face sheet for immediately available ER MD    Medication changes reported      No    Fall or balance concerns reported     No    Warm-up and Cool-down  Performed on first and last piece of equipment    Resistance Training Performed  Yes    VAD Patient?  No      Pain Assessment   Currently in Pain?  No/denies          Social History   Tobacco Use  Smoking Status Former Smoker  . Packs/day: 1.00  . Years: 5.00  . Pack years: 5.00  . Types: Cigarettes  . Last attempt to quit: 07/10/1972  . Years since quitting: 45.3  Smokeless Tobacco Former Systems developer  . Types: Chew  . Quit date: 1975    Goals Met:  Independence with exercise equipment Exercise tolerated well No report of cardiac concerns or symptoms Strength training completed today  Goals Unmet:  Not Applicable  Comments: Pt able to follow exercise prescription today without complaint.  Will continue to monitor for progression.    Dr. Emily Filbert is Medical Director for [place] [facility].

## 2017-10-22 DIAGNOSIS — Z955 Presence of coronary angioplasty implant and graft: Secondary | ICD-10-CM | POA: Diagnosis not present

## 2017-10-22 NOTE — Progress Notes (Signed)
Daily Session Note  Patient Details  [name] MRN: [id number] Date of Birth: Dec 04, 1940 Referring Provider:     Cardiac Rehab from 08/20/2017 in [facility] Cardiac and Pulmonary Rehab  Referring Provider  [name] MD      Encounter Date: 10/22/2017  Check In: Session Check In - 10/22/17 0842      Check-In   Location  [facility]-Cardiac & Pulmonary Rehab    Staff Present  Gerlene Burdock, RN, BSN;Jessica Luan Pulling, MA, RCEP, CCRP, Exercise Physiologist;Deitra Craine Oletta Darter, [place], ACSM CEP, Exercise Physiologist    Supervising physician immediately available to respond to emergencies  See telemetry face sheet for immediately available ER MD    Medication changes reported      No    Fall or balance concerns reported     No    Warm-up and Cool-down  Performed on first and last piece of equipment    Resistance Training Performed  Yes    VAD Patient?  No      Pain Assessment   Currently in Pain?  No/denies    Multiple Pain Sites  No          Social History   Tobacco Use  Smoking Status Former Smoker  . Packs/day: 1.00  . Years: 5.00  . Pack years: 5.00  . Types: Cigarettes  . Last attempt to quit: 07/10/1972  . Years since quitting: 45.3  Smokeless Tobacco Former Systems developer  . Types: Chew  . Quit date: 1975    Goals Met:  Independence with exercise equipment Exercise tolerated well No report of cardiac concerns or symptoms Strength training completed today  Goals Unmet:  Not Applicable  Comments: Pt able to follow exercise prescription today without complaint.  Will continue to monitor for progression.    Dr. Emily Filbert is Medical Director for [place] [facility].

## 2017-10-26 ENCOUNTER — Encounter: Payer: Self-pay | Admitting: Cardiology

## 2017-10-26 ENCOUNTER — Encounter

## 2017-10-26 ENCOUNTER — Ambulatory Visit (INDEPENDENT_AMBULATORY_CARE_PROVIDER_SITE_OTHER): Payer: Medicare Other | Admitting: Cardiology

## 2017-10-26 VITALS — BP 130/56 | HR 88 | Ht 67.0 in | Wt 184.0 lb

## 2017-10-26 DIAGNOSIS — I25119 Atherosclerotic heart disease of native coronary artery with unspecified angina pectoris: Secondary | ICD-10-CM | POA: Diagnosis not present

## 2017-10-26 DIAGNOSIS — G4733 Obstructive sleep apnea (adult) (pediatric): Secondary | ICD-10-CM

## 2017-10-26 DIAGNOSIS — I10 Essential (primary) hypertension: Secondary | ICD-10-CM | POA: Diagnosis not present

## 2017-10-26 HISTORY — DX: Obstructive sleep apnea (adult) (pediatric): G47.33
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
## 2017-10-26 NOTE — Patient Instructions (Signed)
Medication Instructions:  Your physician recommends that you continue on your current medications as directed. Please refer to the Current Medication list given to you today.  If you need a refill on your cardiac medications, please contact your pharmacy first.  Labwork: None ordered   Testing/Procedures: None ordered   Follow-Up: Your physician wants you to follow-up in: 1 year with Dr. Radford Pax. You will receive a reminder letter in the mail two months in advance. If you don't receive a letter, please call our office to schedule the follow-up appointment.  Any Other Special Instructions Will Be Listed Below (If Applicable).   Thank you for choosing [facility], RN  [phone]  If you need a refill on your cardiac medications before your next appointment, please call your pharmacy.

## 2017-10-27 ENCOUNTER — Encounter: Payer: Medicare Other | Attending: Cardiology

## 2017-10-27 DIAGNOSIS — Z79899 Other long term (current) drug therapy: Secondary | ICD-10-CM | POA: Insufficient documentation

## 2017-10-27 DIAGNOSIS — E119 Type 2 diabetes mellitus without complications: Secondary | ICD-10-CM | POA: Insufficient documentation

## 2017-10-27 DIAGNOSIS — Z7982 Long term (current) use of aspirin: Secondary | ICD-10-CM | POA: Insufficient documentation

## 2017-10-27 DIAGNOSIS — Z7902 Long term (current) use of antithrombotics/antiplatelets: Secondary | ICD-10-CM | POA: Insufficient documentation

## 2017-10-27 DIAGNOSIS — M069 Rheumatoid arthritis, unspecified: Secondary | ICD-10-CM | POA: Insufficient documentation

## 2017-10-27 DIAGNOSIS — I2510 Atherosclerotic heart disease of native coronary artery without angina pectoris: Secondary | ICD-10-CM | POA: Insufficient documentation

## 2017-10-27 DIAGNOSIS — I110 Hypertensive heart disease with heart failure: Secondary | ICD-10-CM | POA: Insufficient documentation

## 2017-10-27 DIAGNOSIS — E039 Hypothyroidism, unspecified: Secondary | ICD-10-CM | POA: Insufficient documentation

## 2017-10-27 DIAGNOSIS — Z7984 Long term (current) use of oral hypoglycemic drugs: Secondary | ICD-10-CM | POA: Insufficient documentation

## 2017-10-27 DIAGNOSIS — G4733 Obstructive sleep apnea (adult) (pediatric): Secondary | ICD-10-CM | POA: Insufficient documentation

## 2017-10-27 DIAGNOSIS — I509 Heart failure, unspecified: Secondary | ICD-10-CM | POA: Insufficient documentation

## 2017-10-27 DIAGNOSIS — Z955 Presence of coronary angioplasty implant and graft: Secondary | ICD-10-CM | POA: Insufficient documentation

## 2017-10-29 VITALS — Ht 67.1 in | Wt 183.0 lb

## 2017-10-29 DIAGNOSIS — G4733 Obstructive sleep apnea (adult) (pediatric): Secondary | ICD-10-CM | POA: Diagnosis not present

## 2017-10-29 DIAGNOSIS — Z955 Presence of coronary angioplasty implant and graft: Secondary | ICD-10-CM | POA: Diagnosis present

## 2017-10-29 DIAGNOSIS — Z79899 Other long term (current) drug therapy: Secondary | ICD-10-CM | POA: Diagnosis not present

## 2017-10-29 DIAGNOSIS — Z7982 Long term (current) use of aspirin: Secondary | ICD-10-CM | POA: Diagnosis not present

## 2017-10-29 DIAGNOSIS — I2510 Atherosclerotic heart disease of native coronary artery without angina pectoris: Secondary | ICD-10-CM | POA: Diagnosis not present

## 2017-10-29 DIAGNOSIS — I509 Heart failure, unspecified: Secondary | ICD-10-CM | POA: Diagnosis not present

## 2017-10-29 DIAGNOSIS — M069 Rheumatoid arthritis, unspecified: Secondary | ICD-10-CM | POA: Diagnosis not present

## 2017-10-29 DIAGNOSIS — Z7902 Long term (current) use of antithrombotics/antiplatelets: Secondary | ICD-10-CM | POA: Diagnosis not present

## 2017-10-29 DIAGNOSIS — E119 Type 2 diabetes mellitus without complications: Secondary | ICD-10-CM | POA: Diagnosis not present

## 2017-10-29 DIAGNOSIS — E039 Hypothyroidism, unspecified: Secondary | ICD-10-CM | POA: Diagnosis not present

## 2017-10-29 DIAGNOSIS — I110 Hypertensive heart disease with heart failure: Secondary | ICD-10-CM | POA: Diagnosis not present

## 2017-10-29 DIAGNOSIS — Z7984 Long term (current) use of oral hypoglycemic drugs: Secondary | ICD-10-CM | POA: Diagnosis not present

## 2017-10-29 NOTE — Progress Notes (Signed)
Daily Session Note  Patient Details  [name] MRN: [id number] Date of Birth: 1940-10-28 Referring Provider:     Cardiac Rehab from 08/20/2017 in [facility]  Referring Provider  [name] MD      Encounter Date: 10/29/2017  Check In: Session Check In - 10/29/17 0954      Check-In   Location  [facility]    Staff Present  Joellyn Rued, BS, PEC;Nana Addai, RN BSN;Susanne Bice, RN, BSN, CCRP;Amanda Sommer, BA, ACSM CEP, Exercise Physiologist    Supervising physician immediately available to respond to emergencies  See telemetry face sheet for immediately available ER MD    Medication changes reported      No    Fall or balance concerns reported     No    Warm-up and Cool-down  Performed on first and last piece of equipment    Resistance Training Performed  Yes    VAD Patient?  No      Pain Assessment   Currently in Pain?  No/denies    Multiple Pain Sites  No          Social History   Tobacco Use  Smoking Status Former Smoker  . Packs/day: 1.00  . Years: 5.00  . Pack years: 5.00  . Types: Cigarettes  . Last attempt to quit: 07/10/1972  . Years since quitting: 45.3  Smokeless Tobacco Former Systems developer  . Types: Chew  . Quit date: 79    Goals Met:  Independence with exercise equipment Exercise tolerated well No report of cardiac concerns or symptoms Strength training completed today  Goals Unmet:  Not Applicable  Comments:  6 Minute Walk    Row Name 08/20/17 1322 10/29/17 0955       6 Minute Walk   Phase  Initial  Discharge    Distance  1450 feet  1635 feet    Distance % Change  -  12.75 %    Distance Feet Change  -  185 ft    Walk Time  6 minutes  6 minutes    # of Rest Breaks  0  0    MPH  2.75  3.1    METS  3.23  3.48    RPE  11  12    Perceived Dyspnea   -  1    VO2 Peak  11.3  12.18    Symptoms  No  No    Resting HR  61 bpm  68 bpm    Resting BP  126/72  116/62    Resting Oxygen Saturation    100 %  -    Exercise Oxygen Saturation  during 6 min walk  100 %  96 %    Max Ex. HR  118 bpm  121 bpm    Max Ex. BP  174/84  162/64    2 Minute Post BP  124/70  -         Dr. Emily Filbert is Medical Director for [facility] and [facility].

## 2017-11-03 ENCOUNTER — Ambulatory Visit (INDEPENDENT_AMBULATORY_CARE_PROVIDER_SITE_OTHER): Payer: Medicare Other | Admitting: Internal Medicine

## 2017-11-03 ENCOUNTER — Encounter: Payer: Self-pay | Admitting: Internal Medicine

## 2017-11-03 ENCOUNTER — Encounter: Payer: Medicare Other | Admitting: *Deleted

## 2017-11-03 ENCOUNTER — Telehealth: Payer: Self-pay

## 2017-11-03 VITALS — BP 128/64 | HR 62 | Ht 67.0 in | Wt 185.0 lb

## 2017-11-03 DIAGNOSIS — I2729 Other secondary pulmonary hypertension: Secondary | ICD-10-CM | POA: Diagnosis not present

## 2017-11-03 DIAGNOSIS — I25119 Atherosclerotic heart disease of native coronary artery with unspecified angina pectoris: Secondary | ICD-10-CM | POA: Diagnosis not present

## 2017-11-03 DIAGNOSIS — Z8739 Personal history of other diseases of the musculoskeletal system and connective tissue: Secondary | ICD-10-CM | POA: Diagnosis not present

## 2017-11-03 DIAGNOSIS — R0609 Other forms of dyspnea: Secondary | ICD-10-CM | POA: Diagnosis not present

## 2017-11-03 DIAGNOSIS — Z955 Presence of coronary angioplasty implant and graft: Secondary | ICD-10-CM | POA: Diagnosis not present

## 2017-11-03 DIAGNOSIS — J849 Interstitial pulmonary disease, unspecified: Secondary | ICD-10-CM | POA: Diagnosis not present

## 2017-11-03 LAB — PULMONARY FUNCTION TEST
DL/VA % pred: 89 %
DL/VA: 3.93 ml/min/mmHg/L
DLCO unc % pred: 74 %
DLCO unc: 20.97 ml/min/mmHg
FEF 25-75 Pre: 3.27 L/sec
FEF2575-%Pred-Pre: 179 %
FEV1-%Pred-Pre: 114 %
FEV1-Pre: 2.98 L
FEV1FVC-%Pred-Pre: 115 %
FEV6-%Pred-Pre: 106 %
FEV6-Pre: 3.59 L
FEV6FVC-%Pred-Pre: 107 %
FVC-%Pred-Pre: 98 %
FVC-Pre: 3.59 L
Pre FEV1/FVC ratio: 83 %
Pre FEV6/FVC Ratio: 100 %

## 2017-11-03 NOTE — Addendum Note (Signed)
Addended by: Lorretta Harp on: 11/03/2017 03:17 PM   Modules accepted: Orders

## 2017-11-03 NOTE — Telephone Encounter (Signed)
A user error has taken place

## 2017-11-03 NOTE — Progress Notes (Signed)
Subjective:     [name], male   DOB: [birth year], 77 y.o.   MRN: [id number]  HPI   PCP [name], MD  HPI   IOV 07/10/2017  Chief Complaint  Patient presents with  . Advice Only    Referred by [facility] due to SOB.  PFT done 05/28/17.  Pt has been having issues with SOB x4 months especially with exertion and has some mild chest tightness. Denies any cough.    77 year old male referred by [name] for evaluation of shortness of breath after cardiac etiologies ruled out.  He tells me that he is a remote smoker.  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  He has a long-standing history of rheumatoid arthritis followed by Dr. Jefm Bryant in [place].  He is to be on methotrexate for many years and stopped taking it because of cardiac dysfunction [he personally is convinced that methotrexate because this].  He was then on leflunomide as of 2017 but is currently not on it.  His last rheumatoid factor and CCP antibodies were negative on my personal chart review of the outside records in 2016.    Now for the last 3 or 6 months he is got insidious onset of shortness of breath that is slowly progressive.  His dyspnea on exertion relieved by rest.  Class II-3 activities.  There I  s no associated cough or orthopnea proximal nocturnal dyspnea.  He did have some edema but this got cleared up but the dyspnea is continuing to get worse.  In the last few months he has had a cardiac echo that showed pulmonary hypertension.  Did have cardiac stress test that is normal.  Had pulmonary function test that showed isolated reduction in diffusion capacity and therefore he has been referred here.  Walking desaturation test on 07/10/2017 185 feet x 3 laps on ROOM AIR:  did NOT desaturate. Rest pulse ox was 100%, final pulse ox was 98%. HR response 60/min at rest to 121/min at peak  exertion. Patient Cameron [name]  Did not Desaturate < 88% . Cameron [name] did not  Desaturated </= 3% points. Gerda Diss Hergert yes did get tachyardic   OV 08/04/2017  Chief Complaint  Patient presents with  . Follow-up    ILD    Follow-up interstitial lung disease workup  After the last visit no interim problems.  He has some chronic pedal edema that he will talk to about with his primary care physician.  He did see Dr. Jefm Bryant rheumatologist in July 15, 2017.  I reviewed his notes.  It is not specifically indicate patient has nonspecific seronegative arthritis with a differential diagnosis of seronegative rheumatoid arthritis versus psoriatic.  Patient still seems to think the root of all his problems is the methotrexate he took for over 10 years.  At this point in time there is no decompensation.  As part of the ILD workup his pulmonary function test shows mild reduction in diffusion capacity.  Correlating with this is evidence of pulmonary hypertension on the echo and high-resolution CT chest enlarged pulmonary arteries.  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  His autoimmune panel is negative.  And so the vasculitis panel and hypersensitivity pneumonitis panel.    IMPRESSION: 1. 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 Early/mild usual interstitial pneumonitis is not excluded. 2. Aortic atherosclerosis (ICD10-170.0). Coronary artery calcification. 3. Enlarged pulmonary arteries, indicative of pulmonary arterial Hypertension.- > in echo  Nov 2018: Pulmonary arteries: Systolic pressure was moderately increased.   PA peak pressure: 55 mm Hg (S). 4. Left renal stone, partially imaged.   Electronically Signed   By: Lorin Picket M.D.   On:  07/22/2017 15:03  OV 11/03/2017  Chief Complaint  Patient presents with  . Follow-up    Pt has SOB with exertion, some dry cough.     Follow-up suspected interstitial lung disease in the setting of rheumatoid arthritis and long methotrexate intake  He presents with his wife.  At the time of last visit I was not fully convinced that he had interstitial lung disease.  His CT scan indicated presence of pulmonary hypertension and so did his echocardiogram.  Therefore I referred him back to [name] cardiology.  In the spring 2019 he did have a right heart catheterization and left heart catheterization that showed mild pulmonary hypertension but also coronary artery blockage.  He status post 2 stents.  After that his shortness of breath improved but he tells me overall his fatigue level has not improved.  In talking to him I find out that he exercises 5 times a week doing heart track 2 times a week and the other 3 days walking a mile each time and doing weight lifting.  It appears that he takes a 20-minute nap after these exercises and then feels reenergized.  His mother feels that he does not have the effort tolerance as his younger days but he denies having any symptoms of chest pain or shortness of breath or cough when he does these heavy exertion the weight lifting or walking a mile out doing heart track.  In fact in the walking desaturation test today he walked extremely fast and had no problems.  In terms of his possible interstitial lung disease he had pulmonary function test today and felt to show some improvement and on exam he does not have any crackles.  Also his wife tells me that for the last 2 months he is using CPAP for sleep apnea and this is also helping him.  Simple office walk 185 feet x  3 laps goal with forehead probe 11/03/2017   O2 used Room air  Number laps completed 3  Comments about pace Fast very   Resting Pulse Ox/HR 100% and 70/min  Final Pulse Ox/HR 98% and 121/min   Desaturated </= 88% no  Desaturated <= 3% points no  Got Tachycardic >/= 90/min yes  Symptoms at end of test none  Miscellaneous comments veryu fast    Results for MARIBEL, LUIS" (MRN [id number]) as of 11/03/2017 14:41  Ref. Range 05/28/2017 13:28 11/03/2017 14:10  FVC-Pre Latest Units: L 3.29 3.59  FVC-%Pred-Pre Latest Units: % 90 98  Results for LEOTHA, VOELTZ "[name]" (MRN [id number]) as of 11/03/2017 14:41  Ref. Range 05/28/2017 13:28 11/03/2017 14:10  DLCO unc Latest Units: ml/min/mmHg 20.35 20.97  DLCO unc % pred Latest Units: % 71 74      has a past medical history of Appendicitis, Atrial fibrillation ([place]) (12/12/2013), BPH (benign prostatic hypertrophy), CAD (coronary artery disease) (12/2015), Chest pain (12/03/2015), CHF (congestive heart failure) ([place]), Complete heart block ([place]), Coronary artery disease, Coronary artery disease  involving native coronary artery of native heart with unstable angina pectoris ([place]) (08/11/2017), History of blood transfusion (1968), History of kidney stones, Hyperglycemia (11/05/2013), Hyperlipidemia (11/05/2013), Hypertension, Hypothyroidism, Hypothyroidism, unspecified (11/05/2013), 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   reports that he quit smoking about 45 years ago. His smoking use included cigarettes. He has a 5.00 pack-year smoking history. He quit smokeless tobacco use about 44 years ago. His smokeless tobacco use included chew.  Past Surgical History:  Procedure Laterality Date  . BACK SURGERY    . CARDIAC CATHETERIZATION N/A 12/28/2015   Procedure: Left Heart Cath and Coronary Angiography;  Surgeon: Belva Crome, MD;  Location: [facility];  Service: Cardiovascular;  Laterality: N/A;  . CATARACT EXTRACTION  W/ INTRAOCULAR LENS  IMPLANT, BILATERAL Bilateral   . CORONARY ANGIOPLASTY WITH STENT PLACEMENT  08/11/2017   "2 stents"  . CORONARY STENT INTERVENTION N/A 08/11/2017   Procedure: CORONARY STENT INTERVENTION;  Surgeon: Troy Sine, MD;  Location: [facility];  Service: Cardiovascular;  Laterality: N/A;  . CYSTOSCOPY W/ STONE MANIPULATION    . INGUINAL HERNIA REPAIR Left   . INSERT / REPLACE / REMOVE PACEMAKER  12/09/2011   SJM Accent DR RF implanted by DR Allred for complete heart block and syncope  . JOINT REPLACEMENT    . LAPAROSCOPIC CHOLECYSTECTOMY    . LITHOTRIPSY    . LUMBAR DISC SURGERY     "removed arthritis and spurs"  . PERMANENT PACEMAKER INSERTION N/A 12/09/2011   Procedure: PERMANENT PACEMAKER INSERTION;  Surgeon: [name], MD;  Location: [facility] CATH LAB;  Service: Cardiovascular;  Laterality: N/A;  . REPLACEMENT TOTAL KNEE Right   . RIGHT/LEFT HEART CATH AND CORONARY ANGIOGRAPHY N/A 08/11/2017   Procedure: RIGHT/LEFT HEART CATH AND CORONARY ANGIOGRAPHY;  Surgeon: Larey Dresser, MD;  Location: [facility];  Service: Cardiovascular;  Laterality: N/A;  . TRANSURETHRAL RESECTION OF PROSTATE  2017/2018    Allergies  Allergen Reactions  . Celecoxib Rash    Skin rash     Immunization History  Administered Date(s) Administered  . Influenza, High Dose Seasonal PF 03/26/2017  . Influenza-Unspecified 03/26/2016  . Pneumococcal Polysaccharide-23 12/10/2011  . Pneumococcal-Unspecified 03/26/2016    Family History  Problem Relation Age of Onset  . Pancreatic cancer Mother   . Bone cancer Father   . Alzheimer's disease Sister   . Heart attack Paternal Uncle   . Arthritis Maternal Grandfather   . Alzheimer's disease Paternal Grandmother   . Lung cancer Paternal Grandfather      Current Outpatient Medications:  .  aspirin EC 81 MG tablet, Take 81 mg by mouth daily., Disp: , Rfl:  .  atorvastatin (LIPITOR) 40 MG tablet, Take 1 tablet (40 mg total) by  mouth daily at 6 PM., Disp: 30 tablet, Rfl: 6 .  BLACK PEPPER-TURMERIC PO, Take 1 capsule by mouth 2 (two) times daily., Disp: , Rfl:  .  clopidogrel (PLAVIX) 75 MG tablet, Take 1 tablet (75 mg total) by mouth daily with breakfast., Disp: 30 tablet, Rfl: 6 .  Coenzyme Q10 (CO Q 10) 100 MG CAPS, Take 100 mg by mouth daily., Disp: , Rfl:  .  Cyanocobalamin (B-12) 5000 MCG CAPS, Take 1 capsule by mouth daily., Disp: , Rfl:  .  glimepiride (AMARYL) 2 MG tablet, Take 2 mg by mouth daily., Disp: ,  Rfl:  .  leflunomide (ARAVA) 10 MG tablet, Take 1 tablet by mouth daily., Disp: , Rfl:  .  lisinopril (PRINIVIL,ZESTRIL) 10 MG tablet, Take 1 tablet (10 mg total) by mouth daily., Disp: 90 tablet, Rfl: 3 .  magnesium oxide (MAG-OX) 400 MG tablet, Take 1 tablet by mouth daily., Disp: , Rfl:  .  metFORMIN (GLUCOPHAGE) 500 MG tablet, Take 500 mg by mouth 2 (two) times daily., Disp: , Rfl:  .  mirabegron ER (MYRBETRIQ) 50 MG TB24 tablet, Take 1 tablet (50 mg total) by mouth daily., Disp: 30 tablet, Rfl: 11 .  nebivolol (BYSTOLIC) 5 MG tablet, Take 1 tablet (5 mg total) by mouth daily., Disp: 30 tablet, Rfl: 6 .  nitroGLYCERIN (NITROSTAT) 0.4 MG SL tablet, DISSOLVE 1 TABLET UNDER THE TONGUE EVERY 5 MINUTES AS NEEDED FOR CHEST PAIN., Disp: 75 tablet, Rfl: 1 .  Omega-3 Fatty Acids (OMEGA 3 PO), Take 1 tablet 2 (two) times daily by mouth., Disp: , Rfl:  .  Red Yeast Rice Extract (RED YEAST RICE PO), Take 1 capsule by mouth daily., Disp: , Rfl:  .  thyroid (ARMOUR) 90 MG tablet, Take 90 mg by mouth every morning. , Disp: , Rfl:  .  trospium (SANCTURA) 20 MG tablet, Take 1 tablet by mouth daily., Disp: , Rfl:  .  TURMERIC CURCUMIN PO, Take 1 capsule by mouth daily., Disp: , Rfl:    Review of Systems     Objective:   Physical Exam  Constitutional: He is oriented to person, place, and time. He appears well-developed and well-nourished. No distress.  HENT:  Head: Normocephalic and atraumatic.  Right Ear: External  ear normal.  Left Ear: External ear normal.  Mouth/Throat: Oropharynx is clear and moist. No oropharyngeal exudate.  Eyes: Pupils are equal, round, and reactive to light. Conjunctivae and EOM are normal. Right eye exhibits no discharge. Left eye exhibits no discharge. No scleral icterus.  Neck: Normal range of motion. Neck supple. No JVD present. No tracheal deviation present. No thyromegaly present.  Cardiovascular: Normal rate, regular rhythm and intact distal pulses. Exam reveals no gallop and no friction rub.  No murmur heard. Pulmonary/Chest: Effort normal and breath sounds normal. No respiratory distress. He has no wheezes. He has no rales. He exhibits no tenderness.  Abdominal: Soft. Bowel sounds are normal. He exhibits no distension and no mass. There is no tenderness. There is no rebound and no guarding.  Musculoskeletal: Normal range of motion. He exhibits no edema or tenderness.  Lymphadenopathy:    He has no cervical adenopathy.  Neurological: He is alert and oriented to person, place, and time. He has normal reflexes. No cranial nerve deficit. Coordination normal.  Skin: Skin is warm and dry. No rash noted. He is not diaphoretic. No erythema. No pallor.  Psychiatric: He has a normal mood and affect. His behavior is normal. Judgment and thought content normal.  Nursing note and vitals reviewed.  Today's Vitals   11/03/17 1428  BP: 128/64  Pulse: 62  SpO2: 97%  Weight: 185 lb (83.9 kg)  Height: _0  (1.702 m)    Estimated body mass index is 28.98 kg/m as calculated from the following:   Height as of this encounter: _1  (1.702 m).   Weight as of this encounter: 185 lb (83.9 kg).     Assessment:       ICD-10-CM   1. Dyspnea on exertion R06.09   2. ILD (interstitial lung disease) ([place]) J84.9   3. History  of rheumatoid arthritis Z87.39   4. 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 No lung volume or bd response. No post-bd spiro   Other secondary pulmonary hypertension ([place]) - this was mild on right heart cath spring 2019  - wil lcontinue to monitor clinically  Followup - March / April 2020 after CT chest and PFT test - regular clinic fine   > 50% of this > 25 min visit spent in face to face counseling or coordination of care    Dr. Brand Males, M.D., [facility].C.P Pulmonary and Critical Care Medicine Staff Physician, [place] Director - Interstitial Lung Disease  Program  Pulmonary [place] [facility] [place], [place], [address]  Pager: [phone], If no answer or between  15:00h - 7:00h: call [phone] Telephone: [phone]

## 2017-11-03 NOTE — Progress Notes (Signed)
Patient completed pre spiro and DLCO today.

## 2017-11-03 NOTE — Patient Instructions (Addendum)
Dyspnea on exertion  - glad better after stent placement spring 2019 - residual shortness of breath is due to heavy  Exercise related fatigue +/- mild ILD and +/- mild pulmonary hypertension - continue daily exercises with good rest  ILD (interstitial lung disease) ([place]) History of rheumatoid arthritis   - we are not sure if you have this or not  - PFT June 2019 is better than Jan 2019 - do repeat HRCT in March 2020 (1 year followup  - In March 2020: do repeat Pre-bd spiro and dlco only. No lung volume or bd response. No post-bd spiro   Other secondary pulmonary hypertension ([place]) - this was mild on right heart cath spring 2019  - wil lcontinue to monitor clinically  Followup - March / April 2020 after CT chest and PFT test - regular clinic fine

## 2017-11-03 NOTE — Progress Notes (Signed)
Daily Session Note  Patient Details  [name] MRN: [id number] Date of Birth: 1940-11-05 Referring Provider:     Cardiac Rehab from 08/20/2017 in [facility] Cardiac and Pulmonary Rehab  Referring Provider  [name] MD      Encounter Date: 11/03/2017  Check In: Session Check In - 11/03/17 0907      Check-In   Location  [facility]-Cardiac & Pulmonary Rehab    Staff Present  Heath Lark, RN, BSN, CCRP;Roderick Calo [address], MA, RCEP, CCRP, Exercise Physiologist;Amanda Oletta Darter, BA, ACSM CEP, Exercise Physiologist;Mandi Zachery Conch, BS, [facility]    Supervising physician immediately available to respond to emergencies  See telemetry face sheet for immediately available ER MD    Medication changes reported      No    Fall or balance concerns reported     No    Warm-up and Cool-down  Performed on first and last piece of equipment    Resistance Training Performed  Yes    VAD Patient?  No      Pain Assessment   Currently in Pain?  No/denies          Social History   Tobacco Use  Smoking Status Former Smoker  . Packs/day: 1.00  . Years: 5.00  . Pack years: 5.00  . Types: Cigarettes  . Last attempt to quit: 07/10/1972  . Years since quitting: 45.3  Smokeless Tobacco Former Systems developer  . Types: Chew  . Quit date: 1975    Goals Met:  Proper associated with RPD/PD & O2 Sat Independence with exercise equipment Exercise tolerated well Personal goals reviewed No report of cardiac concerns or symptoms Strength training completed today  Goals Unmet:  Not Applicable  Comments: Pt able to follow exercise prescription today without complaint.  Will continue to monitor for progression. See ITP for goal review   Dr. Emily Filbert is Medical Director for [facility] and [facility].

## 2017-11-05 ENCOUNTER — Ambulatory Visit: Payer: Medicare Other | Admitting: Urology

## 2017-11-09 ENCOUNTER — Encounter: Payer: Medicare Other | Admitting: Nurse Practitioner

## 2017-11-10 DIAGNOSIS — Z955 Presence of coronary angioplasty implant and graft: Secondary | ICD-10-CM

## 2017-11-10 NOTE — Progress Notes (Signed)
Daily Session Note  Patient Details  [name] MRN: [id number] Date of Birth: 03-13-1941 Referring Provider:     Cardiac Rehab from 08/20/2017 in [facility] Cardiac and Pulmonary Rehab  Referring Provider  [name] MD      Encounter Date: 11/10/2017  Check In: Session Check In - 11/10/17 0826      Check-In   Location  [facility]-Cardiac & Pulmonary Rehab    Staff Present  Heath Lark, RN, BSN, CCRP;Jessica [place], MA, RCEP, CCRP, Exercise Physiologist;Isaiah Torok Oletta Darter, [place], ACSM CEP, Exercise Physiologist    Supervising physician immediately available to respond to emergencies  See telemetry face sheet for immediately available ER MD    Medication changes reported      No    Fall or balance concerns reported     No    Warm-up and Cool-down  Performed on first and last piece of equipment    Resistance Training Performed  Yes    VAD Patient?  No      Pain Assessment   Currently in Pain?  No/denies    Multiple Pain Sites  No          Social History   Tobacco Use  Smoking Status Former Smoker  . Packs/day: 1.00  . Years: 5.00  . Pack years: 5.00  . Types: Cigarettes  . Last attempt to quit: 07/10/1972  . Years since quitting: 45.3  Smokeless Tobacco Former Systems developer  . Types: Chew  . Quit date: 1975    Goals Met:  Independence with exercise equipment Exercise tolerated well No report of cardiac concerns or symptoms Strength training completed today  Goals Unmet:  Not Applicable  Comments: Pt able to follow exercise prescription today without complaint.  Will continue to monitor for progression.    Dr. Emily Filbert is Medical Director for [place] and [facility].

## 2017-11-11 ENCOUNTER — Other Ambulatory Visit: Payer: Self-pay | Admitting: Internal Medicine

## 2017-11-11 ENCOUNTER — Ambulatory Visit (INDEPENDENT_AMBULATORY_CARE_PROVIDER_SITE_OTHER): Payer: Medicare Other | Admitting: *Deleted

## 2017-11-11 ENCOUNTER — Encounter: Payer: Self-pay | Admitting: *Deleted

## 2017-11-11 DIAGNOSIS — R55 Syncope and collapse: Secondary | ICD-10-CM

## 2017-11-11 DIAGNOSIS — I442 Atrioventricular block, complete: Secondary | ICD-10-CM

## 2017-11-11 DIAGNOSIS — R270 Ataxia, unspecified: Secondary | ICD-10-CM

## 2017-11-11 DIAGNOSIS — Z955 Presence of coronary angioplasty implant and graft: Secondary | ICD-10-CM

## 2017-11-11 NOTE — Progress Notes (Signed)
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 add in more protein.  Long: Continue to work on portions and weight loss.       Personal Goal #2 Re-Evaluation   Personal Goal #2  -  Avoid grapefruit while on Lipitor  -  -       Nutrition Goals Discharge (Final Nutrition Goals Re-Evaluation): Nutrition Goals Re-Evaluation - 11/03/17 0843      Goals   Nutrition Goal  Choose evening snacks that include both a carbohydrate and a protein source for better blood glucose control,More protien    Comment  Cameron [name] has been trying to eat more protein.  He has talked to a friend about adding in Ensure with protein.  He continues to avoid salt and sweets to help with his diet and weight loss. He will continue to try to get more protein.     Expected Outcome  Short: Continue to add in more protein.  Long: Continue to work on  portions and weight loss.        Psychosocial: Target Goals: Acknowledge presence or absence of significant depression and/or stress, maximize coping skills, provide positive support system. Participant is able to verbalize types and ability to use techniques and skills needed for reducing stress and depression.   Initial Review & Psychosocial Screening: Initial Psych Review & Screening - 08/20/17 1309      Initial Review   Current issues with  None Identified did not reveal any to staff during orientation      [place]?  Yes family  Screening Interventions   Interventions  Encouraged to exercise;Program counselor consult;To provide support and resources with identified psychosocial needs;Provide feedback about the scores to participant    Expected Outcomes  Short Term goal: Utilizing psychosocial counselor, staff and physician to assist with identification of specific Stressors or current issues interfering with healing process. Setting desired goal for each stressor or current issue identified.;Long Term Goal: Stressors or current issues are controlled or eliminated.;Short Term goal: Identification and review with participant of any Quality of Life or Depression concerns found by scoring the questionnaire.;Long Term goal: The participant improves quality of Life and PHQ9 Scores as seen by post scores and/or verbalization of changes       Quality of Life Scores:  Quality of Life - 11/03/17 0927      Quality of Life Scores   Health/Function Pre  27.27 %    Health/Function Post  22.8 %    Health/Function % Change  -16.39 %    Socioeconomic Pre  30 %    Socioeconomic Post  30 %    Socioeconomic % Change   0 %    Psych/Spiritual Pre  30 %    Psych/Spiritual Post  28.57 %    Psych/Spiritual % Change  -4.77 %    Family Pre  30 %    Family Post  30 %    Family % Change  0 %    GLOBAL Pre  28.83 %    GLOBAL Post  26.42 %    GLOBAL % Change  -8.36 %       Scores of 19 and below usually indicate a poorer quality of life in these areas.  A difference of  2-3 points is a clinically meaningful difference.  A difference of 2-3 points in the total score of the Quality of Life Index has been associated with significant improvement in overall quality of life, self-image, physical symptoms, and general health in studies assessing change in quality of life.  PHQ-9: Recent Review Flowsheet Data    Depression screen [facility] 2/9 11/03/2017 08/20/2017   Decreased Interest 0 0   Down, Depressed, Hopeless 0 0   PHQ - 2 Score 0 0   Altered sleeping 0 0   Tired, decreased energy 2 0   Change in appetite 0 0   Feeling bad or failure about yourself  0 0   Trouble concentrating 0 0   Moving slowly or fidgety/restless 0 0   Suicidal thoughts 0 0   PHQ-9 Score 2 0     Interpretation of Total Score  Total Score Depression Severity:  1-4 = Minimal depression, 5-9 = Mild depression, 10-14 = Moderate depression, 15-19 = Moderately severe depression, 20-27 = Severe depression   Psychosocial Evaluation and Intervention: Psychosocial Evaluation - 09/17/17 0941      Psychosocial Evaluation & Interventions   Interventions  Encouraged to exercise with the program and follow exercise prescription    Comments  Counselor met with Cameron [name] [facility]) today for initial psychosocial evaluation. He is a 77 year old who had two stents done in his heart in the past 4 weeks.  He has a strong support system with a spouse of 4 years; friends and active involvement in his local church.  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  He sleeps well with the use of a CPAP and has a good appetite.  Cameron [name] denies a history of depression or anxiety  or any current symptoms.  He is typically in a positive mood.  Makhai has minimal stress in his life other than his health.  He has goals to lose weight and increase his stamina and strength while in  this program.  He will be followed by staff.      Expected Outcomes  Short:  Younis will meet with the dietician to address his weight loss goal.  Long:  He will exercise consistently to regain his stamina and strength.    Continue Psychosocial Services   Follow up required by staff       Psychosocial Re-Evaluation: Psychosocial Re-Evaluation    [place] Name 10/06/17 0851 11/03/17 0845           Psychosocial Re-Evaluation   Current issues with  None Identified  None Identified      Comments  Cameron [name] is doing well in rehab and at home.  He conitnues to do well mentally.  He is compliant with his CPAP use and sleeps well.  He has found his stamina is coming back by coming to exercise.  He would like to continue to try to lose more weight.    Cameron [name] has continued to do well mentally.  He has stayed compliant with CPAP and still sleeping well.  He continues to work on weight loss.  He was able to go whitewater rafting this past weekend with friend and his increased stamina.       Expected Outcomes  Short: Continue to exercise daily.  Long: Continue to stay postive.   Short: Graduate!  Long: Continue to exercise and stay postive.       Continue Psychosocial Services   -  Follow up required by staff         Psychosocial Discharge (Final Psychosocial Re-Evaluation): Psychosocial Re-Evaluation - 11/03/17 0845      Psychosocial Re-Evaluation   Current issues with  None Identified    Comments  Cameron [name] has continued to do well mentally.  He has stayed compliant with CPAP and still sleeping well.  He continues to work on weight loss.  He was able to go whitewater rafting this past weekend with friend and his increased stamina.     Expected Outcomes  Short: Graduate!  Long: Continue to exercise and stay postive.     Continue Psychosocial Services   Follow up required by staff       Vocational Rehabilitation: Provide vocational rehab assistance to qualifying candidates.   Vocational Rehab Evaluation &  Intervention: Vocational Rehab - 08/20/17 1317      Initial Vocational Rehab Evaluation & Intervention   Assessment shows need for Vocational Rehabilitation  No       Education: Education Goals: Education classes will be provided on a variety of topics geared toward better understanding of heart health and risk factor modification. Participant will state understanding/return demonstration of topics presented as noted by education test scores.  Learning Barriers/Preferences: Learning Barriers/Preferences - 08/20/17 1316      Learning Barriers/Preferences   Learning Barriers  None    Learning Preferences  None       Education Topics:  AED/CPR: - Group verbal and written instruction with the use of models to demonstrate the basic use of the AED with the basic ABC's of resuscitation.   Cardiac Rehab from 11/10/2017 in [facility] Cardiac and Pulmonary Rehab  Date  08/27/17  Educator  CE  Instruction Review Code  1- Verbalizes Understanding      General Nutrition Guidelines/Fats and Fiber: -  Group instruction provided by verbal, written material, models and posters to present the general guidelines for heart healthy nutrition. Gives an explanation and review of dietary fats and fiber.   Cardiac Rehab from 11/10/2017 in [facility] Cardiac and Pulmonary Rehab  Date  10/13/17  Educator  PI  Instruction Review Code  1- Verbalizes Understanding      Controlling Sodium/Reading Food Labels: -Group verbal and written material supporting the discussion of sodium use in heart healthy nutrition. Review and explanation with models, verbal and written materials for utilization of the food label.   Cardiac Rehab from 11/10/2017 in [facility] Cardiac and Pulmonary Rehab  Date  10/20/17  Educator  PI  Instruction Review Code  1- Verbalizes Understanding      Exercise Physiology & General Exercise Guidelines: - Group verbal and written instruction with models to review the exercise physiology of the  cardiovascular system and associated critical values. Provides general exercise guidelines with specific guidelines to those with heart or lung disease.    Cardiac Rehab from 11/10/2017 in [facility] Cardiac and Pulmonary Rehab  Date  09/03/17  Educator  [facility]  Instruction Review Code  1- Verbalizes Understanding      Aerobic Exercise & Resistance Training: - Gives group verbal and written instruction on the various components of exercise. Focuses on aerobic and resistive training programs and the benefits of this training and how to safely progress through these programs..   Cardiac Rehab from 11/10/2017 in [facility] Cardiac and Pulmonary Rehab  Date  11/10/17  Educator  [facility]  Instruction Review Code  1- Verbalizes Understanding      Flexibility, Balance, Mind/Body Relaxation: Provides group verbal/written instruction on the benefits of flexibility and balance training, including mind/body exercise modes such as yoga, pilates and tai chi.  Demonstration and skill practice provided.   Cardiac Rehab from 11/10/2017 in [facility] Cardiac and Pulmonary Rehab  Date  09/15/17  Educator  AS  Instruction Review Code  1- Verbalizes Understanding      Stress and Anxiety: - Provides group verbal and written instruction about the health risks of elevated stress and causes of high stress.  Discuss the correlation between heart/lung disease and anxiety and treatment options. Review healthy ways to manage with stress and anxiety.   Cardiac Rehab from 11/10/2017 in [facility] Cardiac and Pulmonary Rehab  Date  09/22/17  Educator  [facility]  Instruction Review Code  1- Verbalizes Understanding      Depression: - Provides group verbal and written instruction on the correlation between heart/lung disease and depressed mood, treatment options, and the stigmas associated with seeking treatment.   Cardiac Rehab from 11/10/2017 in [facility] Cardiac and Pulmonary Rehab  Date  11/03/17  Educator  [facility]  Instruction Review Code  1- Verbalizes  Understanding      Anatomy & Physiology of the Heart: - Group verbal and written instruction and models provide basic cardiac anatomy and physiology, with the coronary electrical and arterial systems. Review of Valvular disease and Heart Failure   Cardiac Procedures: - Group verbal and written instruction to review commonly prescribed medications for heart disease. Reviews the medication, class of the drug, and side effects. Includes the steps to properly store meds and maintain the prescription regimen. (beta blockers and nitrates)   Cardiac Rehab from 11/10/2017 in [facility] Cardiac and Pulmonary Rehab  Date  10/08/17  Educator  CE  Instruction Review Code  1- Verbalizes Understanding      Cardiac Medications I: - Group verbal and written instruction to  review commonly prescribed medications for heart disease. Reviews the medication, class of the drug, and side effects. Includes the steps to properly store meds and maintain the prescription regimen.   Cardiac Medications II: -Group verbal and written instruction to review commonly prescribed medications for heart disease. Reviews the medication, class of the drug, and side effects. (all other drug classes)   Cardiac Rehab from 11/10/2017 in [facility] Cardiac and Pulmonary Rehab  Date  09/17/17  Educator  CE  Instruction Review Code  1- Verbalizes Understanding       Go Sex-Intimacy & Heart Disease, Get SMART - Goal Setting: - Group verbal and written instruction through game format to discuss heart disease and the return to sexual intimacy. Provides group verbal and written material to discuss and apply goal setting through the application of the S.M.A.R.T. Method.   Cardiac Rehab from 11/10/2017 in [facility] Cardiac and Pulmonary Rehab  Date  10/08/17  Educator  CE  Instruction Review Code  1- Verbalizes Understanding      Other Matters of the Heart: - Provides group verbal, written materials and models to describe Stable Angina and  Peripheral Artery. Includes description of the disease process and treatment options available to the cardiac patient.   Exercise & Equipment Safety: - Individual verbal instruction and demonstration of equipment use and safety with use of the equipment.   Cardiac Rehab from 11/10/2017 in [facility] Cardiac and Pulmonary Rehab  Date  08/20/17  Educator  [facility]  Instruction Review Code  1- Verbalizes Understanding      Infection Prevention: - Provides verbal and written material to individual with discussion of infection control including proper hand washing and proper equipment cleaning during exercise session.   Cardiac Rehab from 11/10/2017 in [facility] Cardiac and Pulmonary Rehab  Date  08/20/17  Educator  [facility]  Instruction Review Code  1- Verbalizes Understanding      Falls Prevention: - Provides verbal and written material to individual with discussion of falls prevention and safety.   Cardiac Rehab from 11/10/2017 in [facility] Cardiac and Pulmonary Rehab  Date  08/20/17  Educator  [facility]  Instruction Review Code  1- Verbalizes Understanding      Diabetes: - Individual verbal and written instruction to review signs/symptoms of diabetes, desired ranges of glucose level fasting, after meals and with exercise. Acknowledge that pre and post exercise glucose checks will be done for 3 sessions at entry of program.   Cardiac Rehab from 11/10/2017 in [facility] Cardiac and Pulmonary Rehab  Date  08/20/17  Educator  [facility]  Instruction Review Code  1- Verbalizes Understanding      Know Your Numbers and Risk Factors: -Group verbal and written instruction about important numbers in your health.  Discussion of what are risk factors and how they play a role in the disease process.  Review of Cholesterol, Blood Pressure, Diabetes, and BMI and the role they play in your overall health.   Cardiac Rehab from 11/10/2017 in [facility] Cardiac and Pulmonary Rehab  Date  09/17/17  Educator  CE  Instruction Review Code  1- Verbalizes  Understanding      Sleep Hygiene: -Provides group verbal and written instruction about how sleep can affect your health.  Define sleep hygiene, discuss sleep cycles and impact of sleep habits. Review good sleep hygiene tips.    Cardiac Rehab from 11/10/2017 in [facility] Cardiac and Pulmonary Rehab  Date  10/06/17  Educator  [facility]  Instruction Review Code  1- Verbalizes Understanding  Other: -Provides group and verbal instruction on various topics (see comments)   Knowledge Questionnaire Score: Knowledge Questionnaire Score - 11/03/17 0927      Knowledge Questionnaire Score   Pre Score  26/28    Post Score  26/28       Core Components/Risk Factors/Patient Goals at Admission: Personal Goals and Risk Factors at Admission - 08/20/17 1305      Core Components/Risk Factors/Patient Goals on Admission    Weight Management  Yes;Weight Maintenance    Intervention  Weight Management: Develop a combined nutrition and exercise program designed to reach desired caloric intake, while maintaining appropriate intake of nutrient and fiber, sodium and fats, and appropriate energy expenditure required for the weight goal.;Weight Management: Provide education and appropriate resources to help participant work on and attain dietary goals.    Admit Weight  187 lb (84.8 kg)    Expected Outcomes  Short Term: Continue to assess and modify interventions until short term weight is achieved;Long Term: Adherence to nutrition and physical activity/exercise program aimed toward attainment of established weight goal;Weight Maintenance: Understanding of the daily nutrition guidelines, which includes 25-35% calories from fat, 7% or less cal from saturated fats, less than 250m cholesterol, less than 1.5gm of sodium, & 5 or more servings of fruits and vegetables daily;Understanding recommendations for meals to include 15-35% energy as protein, 25-35% energy from fat, 35-60% energy from carbohydrates, less than 2070mof  dietary cholesterol, 20-35 gm of total fiber daily;Understanding of distribution of calorie intake throughout the day with the consumption of 4-5 meals/snacks    Diabetes  Yes    Intervention  Provide education about signs/symptoms and action to take for hypo/hyperglycemia.;Provide education about proper nutrition, including hydration, and aerobic/resistive exercise prescription along with prescribed medications to achieve blood glucose in normal ranges: Fasting glucose 65-99 mg/dL    Expected Outcomes  Short Term: Participant verbalizes understanding of the signs/symptoms and immediate care of hyper/hypoglycemia, proper foot care and importance of medication, aerobic/resistive exercise and nutrition plan for blood glucose control.;Long Term: Attainment of HbA1C < 7%.    Hypertension  Yes    Intervention  Provide education on lifestyle modifcations including regular physical activity/exercise, weight management, moderate sodium restriction and increased consumption of fresh fruit, vegetables, and low fat dairy, alcohol moderation, and smoking cessation.;Monitor prescription use compliance.    Expected Outcomes  Short Term: Continued assessment and intervention until BP is < 140/9076mG in hypertensive participants. < 130/10m35m in hypertensive participants with diabetes, heart failure or chronic kidney disease.;Long Term: Maintenance of blood pressure at goal levels.       Core Components/Risk Factors/Patient Goals Review:  Goals and Risk Factor Review    Row Name 10/06/17 0842 11/03/17 0840           Core Components/Risk Factors/Patient Goals Review   Personal Goals Review  Weight Management/Obesity;Diabetes;Hypertension;Lipids  Weight Management/Obesity;Diabetes;Hypertension;Lipids      Review  His weight was up today after missing last week.  He would like to get it back down.  His blood sugars have been good.  He used to check daily in the morning, he is going to try to get back to checking  more frequently.  Blood pressures have been good in class.  He has not been checking it at home.  He has a cuff and is going to bring it in to check against what we get in here and then start checking more at home.  He feels that his medications are working well  for him.   Cameron [name] is doing well in rehab.  His weight has been holding fairly steady overall.  He fluctuates some from day to day.  He is still is not checking his blood pressures as he got rid of his cuff with its odd readings.   He is planning to have his doctor write him a prescirption for a new cuff to get insurance to pay for it.  He continues to check his blood sugars at home and they have been good.  He is still doing well on his medicaitons.         Expected Outcomes  Short: Bring in cuff and start checking more frequently at home. Long: Continue to work on weight loss.   Short: Contiue to work on weight loss.  Long: Continue to monitor risk factors.          Core Components/Risk Factors/Patient Goals at Discharge (Final Review):  Goals and Risk Factor Review - 11/03/17 0840      Core Components/Risk Factors/Patient Goals Review   Personal Goals Review  Weight Management/Obesity;Diabetes;Hypertension;Lipids    Review  Cameron [name] is doing well in rehab.  His weight has been holding fairly steady overall.  He fluctuates some from day to day.  He is still is not checking his blood pressures as he got rid of his cuff with its odd readings.   He is planning to have his doctor write him a prescirption for a new cuff to get insurance to pay for it.  He continues to check his blood sugars at home and they have been good.  He is still doing well on his medicaitons.       Expected Outcomes  Short: Contiue to work on weight loss.  Long: Continue to monitor risk factors.        ITP Comments: ITP Comments    Row Name 08/20/17 1303 09/08/17 0933 09/16/17 0542 10/14/17 0558 11/11/17 0603   ITP Comments  Med Review completed. Initial ITP created.  Diagnosis can be found in [facility] Encounter 08/11/17  Cameron [name] was complaining that his leg was hurting behind his knee.  RN looked at it and it was warm to touch and had some swelling.  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  He was encouraged to call the doctor to be seen.  He was able to exercise today and just went a little lighter on the second piece of equipment.   30 day review. Continue with ITP unless directed changes per Medical Director  30 day review. Continue with ITP unless directed changes per Medical Director  30 day review. Continue with ITP unless directed changes per Medical Director review  3 visits in June      Comments:

## 2017-11-11 NOTE — Progress Notes (Signed)
Remote pacemaker transmission.   

## 2017-11-12 ENCOUNTER — Encounter: Payer: Medicare Other | Admitting: *Deleted

## 2017-11-12 DIAGNOSIS — Z955 Presence of coronary angioplasty implant and graft: Secondary | ICD-10-CM

## 2017-11-12 NOTE — Progress Notes (Signed)
Discharge Progress Report  Patient Details  [name] MRN: [id number] Date of Birth: 25-Feb-1941 Referring Provider:     Cardiac Rehab from 08/20/2017 in [facility]  Referring Provider  [name] MD       Number of Visits: 76  Reason for Discharge:  Patient reached a stable level of exercise. Patient independent in their exercise. Patient has met program and personal goals.  Smoking History:  Social History   Tobacco Use  Smoking Status Former Smoker  . Packs/day: 1.00  . Years: 5.00  . Pack years: 5.00  . Types: Cigarettes  . Last attempt to quit: 07/10/1972  . Years since quitting: 45.3  Smokeless Tobacco Former Systems developer  . Types: Chew  . Quit date: 1975    Diagnosis:  Status post coronary artery stent placement  ADL UCSD:   Initial Exercise Prescription: Initial Exercise Prescription - 08/20/17 1300      Date of Initial Exercise RX and Referring Provider   Date  08/20/17    Referring Provider  [name] MD      Treadmill   MPH  2.7    Grade  0.5    Minutes  15    METs  3.25      Recumbant Bike   Level  2    RPM  50    Watts  33    Minutes  15    METs  3.2      T5 Nustep   Level  2    SPM  80    Minutes  15    METs  3.2      Prescription Details   Frequency (times per week)  2    Duration  Progress to 30 minutes of continuous aerobic without signs/symptoms of physical distress      Intensity   THRR 40-80% of Max Heartrate  94-127    Ratings of Perceived Exertion  11-13    Perceived Dyspnea  0-4      Progression   Progression  Continue to progress workloads to maintain intensity without signs/symptoms of physical distress.      Resistance Training   Training Prescription  Yes    Weight  4 lbs    Reps  10-15       Discharge Exercise Prescription (Final Exercise Prescription Changes): Exercise Prescription Changes - 11/03/17 1400      Response to Exercise   Blood Pressure (Admit)  128/66     Blood Pressure (Exercise)  126/60    Blood Pressure (Exit)  128/60    Heart Rate (Admit)  60 bpm    Heart Rate (Exercise)  120 bpm    Heart Rate (Exit)  86 bpm    Rating of Perceived Exertion (Exercise)  13    Symptoms  none    Duration  Continue with 30 min of aerobic exercise without signs/symptoms of physical distress.    Intensity  THRR unchanged      Progression   Progression  Continue to progress workloads to maintain intensity without signs/symptoms of physical distress.    Average METs  3.46      Resistance Training   Training Prescription  Yes    Weight  5 lbs    Reps  10-15      Interval Training   Interval Training  No      Treadmill   MPH  2.7    Grade  3    Minutes  15  METs  4.18      Recumbant Bike   Level  5    Watts  36    Minutes  15    METs  3.3      T5 Nustep   Level  5    Minutes  15    METs  2.9      Home Exercise Plan   Plans to continue exercise at  [employer] (comment) classes at [facility]    Frequency  Add 3 additional days to program exercise sessions.    Initial Home Exercises Provided  09/15/17       Functional Capacity: 6 Minute Walk    Row Name 08/20/17 1322 10/29/17 0955       6 Minute Walk   Phase  Initial  Discharge    Distance  1450 feet  1635 feet    Distance % Change  -  12.75 %    Distance Feet Change  -  185 ft    Walk Time  6 minutes  6 minutes    # of Rest Breaks  0  0    MPH  2.75  3.1    METS  3.23  3.48    RPE  11  12    Perceived Dyspnea   -  1    VO2 Peak  11.3  12.18    Symptoms  No  No    Resting HR  61 bpm  68 bpm    Resting BP  126/72  116/62    Resting Oxygen Saturation   100 %  -    Exercise Oxygen Saturation  during 6 min walk  100 %  96 %    Max Ex. HR  118 bpm  121 bpm    Max Ex. BP  174/84  162/64    2 Minute Post BP  124/70  -       Psychological, QOL, Others - Outcomes: PHQ 2/9: Depression screen [facility] 2/9 11/03/2017 08/20/2017  Decreased Interest 0 0  Down,  Depressed, Hopeless 0 0  PHQ - 2 Score 0 0  Altered sleeping 0 0  Tired, decreased energy 2 0  Change in appetite 0 0  Feeling bad or failure about yourself  0 0  Trouble concentrating 0 0  Moving slowly or fidgety/restless 0 0  Suicidal thoughts 0 0  PHQ-9 Score 2 0    Quality of Life: Quality of Life - 11/03/17 0927      Quality of Life Scores   Health/Function Pre  27.27 %    Health/Function Post  22.8 %    Health/Function % Change  -16.39 %    Socioeconomic Pre  30 %    Socioeconomic Post  30 %    Socioeconomic % Change   0 %    Psych/Spiritual Pre  30 %    Psych/Spiritual Post  28.57 %    Psych/Spiritual % Change  -4.77 %    Family Pre  30 %    Family Post  30 %    Family % Change  0 %    GLOBAL Pre  28.83 %    GLOBAL Post  26.42 %    GLOBAL % Change  -8.36 %       Personal Goals: Goals established at orientation with interventions provided to work toward goal. Personal Goals and Risk Factors at Admission - 08/20/17 1305      Core Components/Risk Factors/Patient Goals on Admission  Weight Management  Yes;Weight Maintenance    Intervention  Weight Management: Develop a combined nutrition and exercise program designed to reach desired caloric intake, while maintaining appropriate intake of nutrient and fiber, sodium and fats, and appropriate energy expenditure required for the weight goal.;Weight Management: Provide education and appropriate resources to help participant work on and attain dietary goals.    Admit Weight  187 lb (84.8 kg)    Expected Outcomes  Short Term: Continue to assess and modify interventions until short term weight is achieved;Long Term: Adherence to nutrition and physical activity/exercise program aimed toward attainment of established weight goal;Weight Maintenance: Understanding of the daily nutrition guidelines, which includes 25-35% calories from fat, 7% or less cal from saturated fats, less than 233m cholesterol, less than 1.5gm of sodium,  & 5 or more servings of fruits and vegetables daily;Understanding recommendations for meals to include 15-35% energy as protein, 25-35% energy from fat, 35-60% energy from carbohydrates, less than 2043mof dietary cholesterol, 20-35 gm of total fiber daily;Understanding of distribution of calorie intake throughout the day with the consumption of 4-5 meals/snacks    Diabetes  Yes    Intervention  Provide education about signs/symptoms and action to take for hypo/hyperglycemia.;Provide education about proper nutrition, including hydration, and aerobic/resistive exercise prescription along with prescribed medications to achieve blood glucose in normal ranges: Fasting glucose 65-99 mg/dL    Expected Outcomes  Short Term: Participant verbalizes understanding of the signs/symptoms and immediate care of hyper/hypoglycemia, proper foot care and importance of medication, aerobic/resistive exercise and nutrition plan for blood glucose control.;Long Term: Attainment of HbA1C < 7%.    Hypertension  Yes    Intervention  Provide education on lifestyle modifcations including regular physical activity/exercise, weight management, moderate sodium restriction and increased consumption of fresh fruit, vegetables, and low fat dairy, alcohol moderation, and smoking cessation.;Monitor prescription use compliance.    Expected Outcomes  Short Term: Continued assessment and intervention until BP is < 140/9059mG in hypertensive participants. < 130/33m34m in hypertensive participants with diabetes, heart failure or chronic kidney disease.;Long Term: Maintenance of blood pressure at goal levels.        Personal Goals Discharge: Goals and Risk Factor Review    Row Name 10/06/17 0842 11/03/17 0840           Core Components/Risk Factors/Patient Goals Review   Personal Goals Review  Weight Management/Obesity;Diabetes;Hypertension;Lipids  Weight Management/Obesity;Diabetes;Hypertension;Lipids      Review  His weight was up  today after missing last week.  He would like to get it back down.  His blood sugars have been good.  He used to check daily in the morning, he is going to try to get back to checking more frequently.  Blood pressures have been good in class.  He has not been checking it at home.  He has a cuff and is going to bring it in to check against what we get in here and then start checking more at home.  He feels that his medications are working well for him.   [name] is doing well in rehab.  His weight has been holding fairly steady overall.  He fluctuates some from day to day.  He is still is not checking his blood pressures as he got rid of his cuff with its odd readings.   He is planning to have his doctor write him a prescirption for a new cuff to get insurance to pay for it.  He continues to check his blood sugars at  home and they have been good.  He is still doing well on his medicaitons.         Expected Outcomes  Short: Bring in cuff and start checking more frequently at home. Long: Continue to work on weight loss.   Short: Contiue to work on weight loss.  Long: Continue to monitor risk factors.          Exercise Goals and Review: Exercise Goals    Row Name 08/20/17 1326             Exercise Goals   Increase Physical Activity  Yes       Intervention  Provide advice, education, support and counseling about physical activity/exercise needs.;Develop an individualized exercise prescription for aerobic and resistive training based on initial evaluation findings, risk stratification, comorbidities and participant's personal goals.       Expected Outcomes  Short Term: Attend rehab on a regular basis to increase amount of physical activity.;Long Term: Add in home exercise to make exercise part of routine and to increase amount of physical activity.;Long Term: Exercising regularly at least 3-5 days a week.       Increase Strength and Stamina  Yes       Intervention  Provide advice, education, support and  counseling about physical activity/exercise needs.;Develop an individualized exercise prescription for aerobic and resistive training based on initial evaluation findings, risk stratification, comorbidities and participant's personal goals.       Expected Outcomes  Short Term: Increase workloads from initial exercise prescription for resistance, speed, and METs.;Short Term: Perform resistance training exercises routinely during rehab and add in resistance training at home;Long Term: Improve cardiorespiratory fitness, muscular endurance and strength as measured by increased METs and functional capacity (6MWT)       Able to understand and use rate of perceived exertion (RPE) scale  Yes       Intervention  Provide education and explanation on how to use RPE scale       Expected Outcomes  Short Term: Able to use RPE daily in rehab to express subjective intensity level;Long Term:  Able to use RPE to guide intensity level when exercising independently       Able to understand and use Dyspnea scale  Yes       Intervention  Provide education and explanation on how to use Dyspnea scale       Expected Outcomes  Short Term: Able to use Dyspnea scale daily in rehab to express subjective sense of shortness of breath during exertion;Long Term: Able to use Dyspnea scale to guide intensity level when exercising independently       Knowledge and understanding of Target Heart Rate Range (THRR)  Yes       Intervention  Provide education and explanation of THRR including how the numbers were predicted and where they are located for reference       Expected Outcomes  Short Term: Able to state/look up THRR;Long Term: Able to use THRR to govern intensity when exercising independently;Short Term: Able to use daily as guideline for intensity in rehab       Able to check pulse independently  Yes       Intervention  Provide education and demonstration on how to check pulse in carotid and radial arteries.;Review the importance of  being able to check your own pulse for safety during independent exercise       Expected Outcomes  Short Term: Able to explain why pulse checking is important during independent  exercise;Long Term: Able to check pulse independently and accurately       Understanding of Exercise Prescription  Yes       Intervention  Provide education, explanation, and written materials on patient's individual exercise prescription       Expected Outcomes  Short Term: Able to explain program exercise prescription;Long Term: Able to explain home exercise prescription to exercise independently          Nutrition & Weight - Outcomes: Pre Biometrics - 08/20/17 1326      Pre Biometrics   Height  5' 7.1" (1.704 m)    Weight  187 lb 8 oz (85 kg)    Waist Circumference  39 inches    Hip Circumference  38 inches    Waist to Hip Ratio  1.03 %    BMI (Calculated)  29.29    Single Leg Stand  30 seconds      Post Biometrics - 10/29/17 0955       Post  Biometrics   Height  5' 7.1" (1.704 m)    Weight  183 lb (83 kg)    Waist Circumference  39 inches    Hip Circumference  38 inches    Waist to Hip Ratio  1.03 %    BMI (Calculated)  28.59    Single Leg Stand  30 seconds       Nutrition: Nutrition Therapy & Goals - 09/03/17 1042      Nutrition Therapy   Diet  DM, DASH    Drug/Food Interactions  Statins/Certain Fruits    Protein (specify units)  11oz    Fiber  35 grams    Whole Grain Foods  3 servings    Saturated Fats  13 max. grams    Fruits and Vegetables  6 servings/day 8 ideal    Sodium  1500 grams      Personal Nutrition Goals   Nutrition Goal  Because you eat out frequently, look for ways to reduce added salt, such as asking for no salt, removing the skin from meats, and choosing non-fried/ greasy food    Personal Goal #2  Choose evening snacks that include both a carbohydrate source and a protein source to manage blood glucose control    Personal Goal #3  Avoid grapefruit while taking Lipitor     Comments  He limits intake of processed foods, fried food, soda, red meat and white starches. His wife provides whole grains and vegetables frequently      Intervention Plan   Intervention  Prescribe, educate and counsel regarding individualized specific dietary modifications aiming towards targeted core components such as weight, hypertension, lipid management, diabetes, heart failure and other comorbidities.    Expected Outcomes  Short Term Goal: Understand basic principles of dietary content, such as calories, fat, sodium, cholesterol and nutrients.;Long Term Goal: Adherence to prescribed nutrition plan.;Short Term Goal: A plan has been developed with personal nutrition goals set during dietitian appointment.       Nutrition Discharge: Nutrition Assessments - 11/03/17 0927      MEDFICTS Scores   Pre Score  29    Post Score  15    Score Difference  -14       Education Questionnaire Score: Knowledge Questionnaire Score - 11/03/17 8341      Knowledge Questionnaire Score   Pre Score  26/28    Post Score  26/28       Goals reviewed with patient; copy given to patient.

## 2017-11-12 NOTE — Patient Instructions (Signed)
Discharge Patient Instructions  Patient Details  [name] MRN: [id number] Date of Birth: Sep 03, 1940 Referring Provider:  [name], MD   Number of Visits: 39  Reason for Discharge:  Patient reached a stable level of exercise. Patient independent in their exercise. Patient has met program and personal goals.  Smoking History:  Social History   Tobacco Use  Smoking Status Former Smoker  . Packs/day: 1.00  . Years: 5.00  . Pack years: 5.00  . Types: Cigarettes  . Last attempt to quit: 07/10/1972  . Years since quitting: 45.3  Smokeless Tobacco Former Systems developer  . Types: Chew  . Quit date: 1975    Diagnosis:  Status post coronary artery stent placement  Initial Exercise Prescription: Initial Exercise Prescription - 08/20/17 1300      Date of Initial Exercise RX and Referring Provider   Date  08/20/17    Referring Provider  [name] MD      Treadmill   MPH  2.7    Grade  0.5    Minutes  15    METs  3.25      Recumbant Bike   Level  2    RPM  50    Watts  33    Minutes  15    METs  3.2      T5 Nustep   Level  2    SPM  80    Minutes  15    METs  3.2      Prescription Details   Frequency (times per week)  2    Duration  Progress to 30 minutes of continuous aerobic without signs/symptoms of physical distress      Intensity   THRR 40-80% of Max Heartrate  94-127    Ratings of Perceived Exertion  11-13    Perceived Dyspnea  0-4      Progression   Progression  Continue to progress workloads to maintain intensity without signs/symptoms of physical distress.      Resistance Training   Training Prescription  Yes    Weight  4 lbs    Reps  10-15       Discharge Exercise Prescription (Final Exercise Prescription Changes): Exercise Prescription Changes - 11/03/17 1400      Response to Exercise   Blood Pressure (Admit)  128/66    Blood Pressure (Exercise)  126/60    Blood Pressure (Exit)  128/60    Heart Rate (Admit)  60 bpm     Heart Rate (Exercise)  120 bpm    Heart Rate (Exit)  86 bpm    Rating of Perceived Exertion (Exercise)  13    Symptoms  none    Duration  Continue with 30 min of aerobic exercise without signs/symptoms of physical distress.    Intensity  THRR unchanged      Progression   Progression  Continue to progress workloads to maintain intensity without signs/symptoms of physical distress.    Average METs  3.46      Resistance Training   Training Prescription  Yes    Weight  5 lbs    Reps  10-15      Interval Training   Interval Training  No      Treadmill   MPH  2.7    Grade  3    Minutes  15    METs  4.18      Recumbant Bike   Level  5    Watts  36  Minutes  15    METs  3.3      T5 Nustep   Level  5    Minutes  15    METs  2.9      Home Exercise Plan   Plans to continue exercise at  [employer] (comment) classes at [facility]    Frequency  Add 3 additional days to program exercise sessions.    Initial Home Exercises Provided  09/15/17       Functional Capacity: 6 Minute Walk    Row Name 08/20/17 1322 10/29/17 0955       6 Minute Walk   Phase  Initial  Discharge    Distance  1450 feet  1635 feet    Distance % Change  -  12.75 %    Distance Feet Change  -  185 ft    Walk Time  6 minutes  6 minutes    # of Rest Breaks  0  0    MPH  2.75  3.1    METS  3.23  3.48    RPE  11  12    Perceived Dyspnea   -  1    VO2 Peak  11.3  12.18    Symptoms  No  No    Resting HR  61 bpm  68 bpm    Resting BP  126/72  116/62    Resting Oxygen Saturation   100 %  -    Exercise Oxygen Saturation  during 6 min walk  100 %  96 %    Max Ex. HR  118 bpm  121 bpm    Max Ex. BP  174/84  162/64    2 Minute Post BP  124/70  -       Quality of Life: Quality of Life - 11/03/17 0927      Quality of Life Scores   Health/Function Pre  27.27 %    Health/Function Post  22.8 %    Health/Function % Change  -16.39 %    Socioeconomic Pre  30 %    Socioeconomic Post   30 %    Socioeconomic % Change   0 %    Psych/Spiritual Pre  30 %    Psych/Spiritual Post  28.57 %    Psych/Spiritual % Change  -4.77 %    Family Pre  30 %    Family Post  30 %    Family % Change  0 %    GLOBAL Pre  28.83 %    GLOBAL Post  26.42 %    GLOBAL % Change  -8.36 %       Personal Goals: Goals established at orientation with interventions provided to work toward goal. Personal Goals and Risk Factors at Admission - 08/20/17 1305      Core Components/Risk Factors/Patient Goals on Admission    Weight Management  Yes;Weight Maintenance    Intervention  Weight Management: Develop a combined nutrition and exercise program designed to reach desired caloric intake, while maintaining appropriate intake of nutrient and fiber, sodium and fats, and appropriate energy expenditure required for the weight goal.;Weight Management: Provide education and appropriate resources to help participant work on and attain dietary goals.    Admit Weight  187 lb (84.8 kg)    Expected Outcomes  Short Term: Continue to assess and modify interventions until short term weight is achieved;Long Term: Adherence to nutrition and physical activity/exercise program aimed toward attainment of established weight goal;Weight  Maintenance: Understanding of the daily nutrition guidelines, which includes 25-35% calories from fat, 7% or less cal from saturated fats, less than 233m cholesterol, less than 1.5gm of sodium, & 5 or more servings of fruits and vegetables daily;Understanding recommendations for meals to include 15-35% energy as protein, 25-35% energy from fat, 35-60% energy from carbohydrates, less than 2078mof dietary cholesterol, 20-35 gm of total fiber daily;Understanding of distribution of calorie intake throughout the day with the consumption of 4-5 meals/snacks    Diabetes  Yes    Intervention  Provide education about signs/symptoms and action to take for hypo/hyperglycemia.;Provide education about proper  nutrition, including hydration, and aerobic/resistive exercise prescription along with prescribed medications to achieve blood glucose in normal ranges: Fasting glucose 65-99 mg/dL    Expected Outcomes  Short Term: Participant verbalizes understanding of the signs/symptoms and immediate care of hyper/hypoglycemia, proper foot care and importance of medication, aerobic/resistive exercise and nutrition plan for blood glucose control.;Long Term: Attainment of HbA1C < 7%.    Hypertension  Yes    Intervention  Provide education on lifestyle modifcations including regular physical activity/exercise, weight management, moderate sodium restriction and increased consumption of fresh fruit, vegetables, and low fat dairy, alcohol moderation, and smoking cessation.;Monitor prescription use compliance.    Expected Outcomes  Short Term: Continued assessment and intervention until BP is < 140/9078mG in hypertensive participants. < 130/51m52m in hypertensive participants with diabetes, heart failure or chronic kidney disease.;Long Term: Maintenance of blood pressure at goal levels.        Personal Goals Discharge: Goals and Risk Factor Review - 11/03/17 0840      Core Components/Risk Factors/Patient Goals Review   Personal Goals Review  Weight Management/Obesity;Diabetes;Hypertension;Lipids    Review  Cameron [name] is doing well in rehab.  His weight has been holding fairly steady overall.  He fluctuates some from day to day.  He is still is not checking his blood pressures as he got rid of his cuff with its odd readings.   He is planning to have his doctor write him a prescirption for a new cuff to get insurance to pay for it.  He continues to check his blood sugars at home and they have been good.  He is still doing well on his medicaitons.       Expected Outcomes  Short: Contiue to work on weight loss.  Long: Continue to monitor risk factors.        Exercise Goals and Review: Exercise Goals    Row Name 08/20/17  1326             Exercise Goals   Increase Physical Activity  Yes       Intervention  Provide advice, education, support and counseling about physical activity/exercise needs.;Develop an individualized exercise prescription for aerobic and resistive training based on initial evaluation findings, risk stratification, comorbidities and participant's personal goals.       Expected Outcomes  Short Term: Attend rehab on a regular basis to increase amount of physical activity.;Long Term: Add in home exercise to make exercise part of routine and to increase amount of physical activity.;Long Term: Exercising regularly at least 3-5 days a week.       Increase Strength and Stamina  Yes       Intervention  Provide advice, education, support and counseling about physical activity/exercise needs.;Develop an individualized exercise prescription for aerobic and resistive training based on initial evaluation findings, risk stratification, comorbidities and participant's personal goals.  Expected Outcomes  Short Term: Increase workloads from initial exercise prescription for resistance, speed, and METs.;Short Term: Perform resistance training exercises routinely during rehab and add in resistance training at home;Long Term: Improve cardiorespiratory fitness, muscular endurance and strength as measured by increased METs and functional capacity (6MWT)       Able to understand and use rate of perceived exertion (RPE) scale  Yes       Intervention  Provide education and explanation on how to use RPE scale       Expected Outcomes  Short Term: Able to use RPE daily in rehab to express subjective intensity level;Long Term:  Able to use RPE to guide intensity level when exercising independently       Able to understand and use Dyspnea scale  Yes       Intervention  Provide education and explanation on how to use Dyspnea scale       Expected Outcomes  Short Term: Able to use Dyspnea scale daily in rehab to express  subjective sense of shortness of breath during exertion;Long Term: Able to use Dyspnea scale to guide intensity level when exercising independently       Knowledge and understanding of Target Heart Rate Range (THRR)  Yes       Intervention  Provide education and explanation of THRR including how the numbers were predicted and where they are located for reference       Expected Outcomes  Short Term: Able to state/look up THRR;Long Term: Able to use THRR to govern intensity when exercising independently;Short Term: Able to use daily as guideline for intensity in rehab       Able to check pulse independently  Yes       Intervention  Provide education and demonstration on how to check pulse in carotid and radial arteries.;Review the importance of being able to check your own pulse for safety during independent exercise       Expected Outcomes  Short Term: Able to explain why pulse checking is important during independent exercise;Long Term: Able to check pulse independently and accurately       Understanding of Exercise Prescription  Yes       Intervention  Provide education, explanation, and written materials on patient's individual exercise prescription       Expected Outcomes  Short Term: Able to explain program exercise prescription;Long Term: Able to explain home exercise prescription to exercise independently          Nutrition & Weight - Outcomes: Pre Biometrics - 08/20/17 1326      Pre Biometrics   Height  5' 7.1" (1.704 m)    Weight  187 lb 8 oz (85 kg)    Waist Circumference  39 inches    Hip Circumference  38 inches    Waist to Hip Ratio  1.03 %    BMI (Calculated)  29.29    Single Leg Stand  30 seconds      Post Biometrics - 10/29/17 0955       Post  Biometrics   Height  5' 7.1" (1.704 m)    Weight  183 lb (83 kg)    Waist Circumference  39 inches    Hip Circumference  38 inches    Waist to Hip Ratio  1.03 %    BMI (Calculated)  28.59    Single Leg Stand  30 seconds        Nutrition: Nutrition Therapy & Goals - 09/03/17 1042  Nutrition Therapy   Diet  DM, DASH    Drug/Food Interactions  Statins/Certain Fruits    Protein (specify units)  11oz    Fiber  35 grams    Whole Grain Foods  3 servings    Saturated Fats  13 max. grams    Fruits and Vegetables  6 servings/day 8 ideal    Sodium  1500 grams      Personal Nutrition Goals   Nutrition Goal  Because you eat out frequently, look for ways to reduce added salt, such as asking for no salt, removing the skin from meats, and choosing non-fried/ greasy food    Personal Goal #2  Choose evening snacks that include both a carbohydrate source and a protein source to manage blood glucose control    Personal Goal #3  Avoid grapefruit while taking Lipitor    Comments  He limits intake of processed foods, fried food, soda, red meat and white starches. His wife provides whole grains and vegetables frequently      Intervention Plan   Intervention  Prescribe, educate and counsel regarding individualized specific dietary modifications aiming towards targeted core components such as weight, hypertension, lipid management, diabetes, heart failure and other comorbidities.    Expected Outcomes  Short Term Goal: Understand basic principles of dietary content, such as calories, fat, sodium, cholesterol and nutrients.;Long Term Goal: Adherence to prescribed nutrition plan.;Short Term Goal: A plan has been developed with personal nutrition goals set during dietitian appointment.       Nutrition Discharge: Nutrition Assessments - 11/03/17 0927      MEDFICTS Scores   Pre Score  29    Post Score  15    Score Difference  -14       Education Questionnaire Score: Knowledge Questionnaire Score - 11/03/17 1448      Knowledge Questionnaire Score   Pre Score  26/28    Post Score  26/28       Goals reviewed with patient; copy given to patient.

## 2017-11-12 NOTE — Progress Notes (Signed)
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 continues to avoid salt and sweets to help with his diet and weight loss. He will continue to try to get more protein.     Expected Outcome  He will choose vegetables, lean proteins, complex carbohydrates, and limit salt when eating out  He will add a protein source to his evening snacks, such as yogurt, cheese or nuts  Short: Continue to find protein snacks.  Long: Continue to work on portion control.   Short: Continue to add in more protein.  Long: Continue to work on portions and weight loss.       Personal Goal #2 Re-Evaluation   Personal Goal #2  -  Avoid grapefruit while on Lipitor  -  -       Nutrition Goals Discharge (Final Nutrition Goals Re-Evaluation): Nutrition Goals Re-Evaluation - 11/03/17 0843      Goals   Nutrition Goal  Choose evening snacks that include both a carbohydrate and a protein  source for better blood glucose control,More protien    Comment  Cameron [name] has been trying to eat more protein.  He has talked to a friend about adding in Ensure with protein.  He continues to avoid salt and sweets to help with his diet and weight loss. He will continue to try to get more protein.     Expected Outcome  Short: Continue to add in more protein.  Long: Continue to work on portions and weight loss.        Psychosocial: Target Goals: Acknowledge presence or absence of significant depression and/or stress, maximize coping skills, provide positive support system.  Participant is able to verbalize types and ability to use techniques and skills needed for reducing stress and depression.   Initial Review & Psychosocial Screening: Initial Psych Review & Screening - 08/20/17 1309      Initial Review   Current issues with  None Identified did not reveal any to staff during orientation      [place]?  Yes family      Screening Interventions   Interventions  Encouraged to exercise;Program counselor consult;To provide support and resources with identified psychosocial needs;Provide feedback about the scores to participant    Expected Outcomes  Short Term goal: Utilizing psychosocial counselor, staff and physician to assist with identification of specific Stressors or current issues interfering with healing process. Setting desired goal for each stressor or current issue identified.;Long Term Goal: Stressors or current issues are controlled or eliminated.;Short Term goal: Identification and review with participant of any Quality of Life or Depression concerns found by scoring the questionnaire.;Long Term goal: The participant improves quality of Life and PHQ9 Scores as seen by post scores and/or verbalization of changes       Quality of Life Scores:  Quality of Life - 11/03/17 0927      Quality of Life Scores   Health/Function Pre  27.27 %    Health/Function Post   22.8 %    Health/Function % Change  -16.39 %    Socioeconomic Pre  30 %    Socioeconomic Post  30 %    Socioeconomic % Change   0 %    Psych/Spiritual Pre  30 %    Psych/Spiritual Post  28.57 %    Psych/Spiritual % Change  -4.77 %    Family Pre  30 %    Family Post  30 %    Family % Change  0 %    GLOBAL Pre  28.83 %    GLOBAL Post  26.42 %    GLOBAL % Change  -8.36 %      Scores of 19 and below usually indicate a poorer quality of life in these areas.  A difference of  2-3 points is a clinically meaningful difference.  A difference of 2-3 points in the total score of the Quality of Life Index has been associated with significant improvement in overall quality of life, self-image, physical symptoms, and general health in studies assessing change in quality of life.  PHQ-9: Recent Review Flowsheet Data    Depression screen [facility] 2/9 11/03/2017 08/20/2017   Decreased Interest 0 0   Down, Depressed, Hopeless 0 0   PHQ - 2 Score 0 0   Altered sleeping 0 0   Tired, decreased energy 2 0   Change in appetite 0 0   Feeling bad or failure about yourself  0 0   Trouble concentrating 0 0   Moving slowly or fidgety/restless 0 0   Suicidal thoughts 0 0   PHQ-9 Score 2 0     Interpretation of Total Score  Total Score Depression Severity:  1-4 = Minimal depression, 5-9 = Mild depression, 10-14 = Moderate depression, 15-19 = Moderately severe depression, 20-27 = Severe depression   Psychosocial Evaluation and Intervention: Psychosocial Evaluation - 09/17/17 0941      Psychosocial Evaluation & Interventions   Interventions  Encouraged to exercise with the program and follow exercise prescription    Comments  Counselor met with Cameron [name] [facility]) today for initial psychosocial evaluation. He is a 77 year old  who had two stents done in his heart in the past 4 weeks.  He has a strong support system with a spouse of 4 years; friends and active involvement in his local church.  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  He sleeps well with the use of a CPAP and has a good appetite.  Cameron [name] denies a history of depression or anxiety or any current symptoms.  He is typically in a positive mood.  Nasiah has minimal stress in his life other than his health.  He has goals to lose weight and increase his stamina and strength while in this program.  He will be followed by staff.      Expected Outcomes  Short:  Caroll will meet with the dietician to address his weight loss goal.  Long:  He will exercise consistently to regain his stamina and strength.    Continue Psychosocial Services   Follow up required by staff       Psychosocial Re-Evaluation: Psychosocial Re-Evaluation    [place] Name 10/06/17 0851 11/03/17 0845           Psychosocial Re-Evaluation   Current issues with  None Identified  None Identified      Comments  Cameron [name] is doing well in rehab and at home.  He conitnues to do well mentally.  He is compliant with his CPAP use and sleeps well.  He has found his stamina is coming back by coming to exercise.  He would like to continue to try to lose more weight.    Cameron [name] has continued to do well mentally.  He has stayed compliant with CPAP and still sleeping well.  He continues to work on weight loss.  He was able to go whitewater rafting this past weekend with friend and his increased stamina.       Expected Outcomes  Short: Continue to exercise daily.  Long: Continue to stay postive.   Short: Graduate!  Long: Continue to exercise and stay postive.       Continue Psychosocial Services   -  Follow up required by staff         Psychosocial Discharge (Final Psychosocial Re-Evaluation): Psychosocial Re-Evaluation - 11/03/17 0845      Psychosocial Re-Evaluation   Current issues with  None Identified    Comments  Cameron [name] has continued to do well mentally.  He has stayed compliant with CPAP and still sleeping well.  He continues to work on weight loss.   He was able to go whitewater rafting this past weekend with friend and his increased stamina.     Expected Outcomes  Short: Graduate!  Long: Continue to exercise and stay postive.     Continue Psychosocial Services   Follow up required by staff       Vocational Rehabilitation: Provide vocational rehab assistance to qualifying candidates.   Vocational Rehab Evaluation & Intervention: Vocational Rehab - 08/20/17 1317      Initial Vocational Rehab Evaluation & Intervention   Assessment shows need for Vocational Rehabilitation  No       Education: Education Goals: Education classes will be provided on a variety of topics geared toward better understanding of heart health and risk factor modification. Participant will state understanding/return demonstration of topics presented as noted by education test scores.  Learning Barriers/Preferences: Learning Barriers/Preferences - 08/20/17 1316      Learning Barriers/Preferences   Learning Barriers  None  Learning Preferences  None       Education Topics:  AED/CPR: - Group verbal and written instruction with the use of models to demonstrate the basic use of the AED with the basic ABC's of resuscitation.   Cardiac Rehab from 11/12/2017 in [facility] Cardiac and Pulmonary Rehab  Date  08/27/17  Educator  CE  Instruction Review Code  1- Verbalizes Understanding      General Nutrition Guidelines/Fats and Fiber: -Group instruction provided by verbal, written material, models and posters to present the general guidelines for heart healthy nutrition. Gives an explanation and review of dietary fats and fiber.   Cardiac Rehab from 11/12/2017 in [facility] Cardiac and Pulmonary Rehab  Date  10/13/17  Educator  PI  Instruction Review Code  1- Verbalizes Understanding      Controlling Sodium/Reading Food Labels: -Group verbal and written material supporting the discussion of sodium use in heart healthy nutrition. Review and explanation with models,  verbal and written materials for utilization of the food label.   Cardiac Rehab from 11/12/2017 in [facility] Cardiac and Pulmonary Rehab  Date  10/20/17  Educator  PI  Instruction Review Code  1- Verbalizes Understanding      Exercise Physiology & General Exercise Guidelines: - Group verbal and written instruction with models to review the exercise physiology of the cardiovascular system and associated critical values. Provides general exercise guidelines with specific guidelines to those with heart or lung disease.    Cardiac Rehab from 11/12/2017 in [facility] Cardiac and Pulmonary Rehab  Date  09/03/17  Educator  [facility]  Instruction Review Code  1- Verbalizes Understanding      Aerobic Exercise & Resistance Training: - Gives group verbal and written instruction on the various components of exercise. Focuses on aerobic and resistive training programs and the benefits of this training and how to safely progress through these programs..   Cardiac Rehab from 11/12/2017 in [facility] Cardiac and Pulmonary Rehab  Date  11/10/17  Educator  [facility]  Instruction Review Code  1- Verbalizes Understanding      Flexibility, Balance, Mind/Body Relaxation: Provides group verbal/written instruction on the benefits of flexibility and balance training, including mind/body exercise modes such as yoga, pilates and tai chi.  Demonstration and skill practice provided.   Cardiac Rehab from 11/12/2017 in [facility] Cardiac and Pulmonary Rehab  Date  11/12/17  Educator  AS  Instruction Review Code  1- Verbalizes Understanding      Stress and Anxiety: - Provides group verbal and written instruction about the health risks of elevated stress and causes of high stress.  Discuss the correlation between heart/lung disease and anxiety and treatment options. Review healthy ways to manage with stress and anxiety.   Cardiac Rehab from 11/12/2017 in [facility] Cardiac and Pulmonary Rehab  Date  09/22/17  Educator  [facility]  Instruction Review Code  1-  Verbalizes Understanding      Depression: - Provides group verbal and written instruction on the correlation between heart/lung disease and depressed mood, treatment options, and the stigmas associated with seeking treatment.   Cardiac Rehab from 11/12/2017 in [facility] Cardiac and Pulmonary Rehab  Date  11/03/17  Educator  [facility]  Instruction Review Code  1- Verbalizes Understanding      Anatomy & Physiology of the Heart: - Group verbal and written instruction and models provide basic cardiac anatomy and physiology, with the coronary electrical and arterial systems. Review of Valvular disease and Heart Failure   Cardiac Procedures: - Group verbal and written instruction  to review commonly prescribed medications for heart disease. Reviews the medication, class of the drug, and side effects. Includes the steps to properly store meds and maintain the prescription regimen. (beta blockers and nitrates)   Cardiac Rehab from 11/12/2017 in [facility] Cardiac and Pulmonary Rehab  Date  10/08/17  Educator  CE  Instruction Review Code  1- Verbalizes Understanding      Cardiac Medications I: - Group verbal and written instruction to review commonly prescribed medications for heart disease. Reviews the medication, class of the drug, and side effects. Includes the steps to properly store meds and maintain the prescription regimen.   Cardiac Medications II: -Group verbal and written instruction to review commonly prescribed medications for heart disease. Reviews the medication, class of the drug, and side effects. (all other drug classes)   Cardiac Rehab from 11/12/2017 in [facility] Cardiac and Pulmonary Rehab  Date  09/17/17  Educator  CE  Instruction Review Code  1- Verbalizes Understanding       Go Sex-Intimacy & Heart Disease, Get SMART - Goal Setting: - Group verbal and written instruction through game format to discuss heart disease and the return to sexual intimacy. Provides group verbal and written  material to discuss and apply goal setting through the application of the S.M.A.R.T. Method.   Cardiac Rehab from 11/12/2017 in [facility] Cardiac and Pulmonary Rehab  Date  10/08/17  Educator  CE  Instruction Review Code  1- Verbalizes Understanding      Other Matters of the Heart: - Provides group verbal, written materials and models to describe Stable Angina and Peripheral Artery. Includes description of the disease process and treatment options available to the cardiac patient.   Exercise & Equipment Safety: - Individual verbal instruction and demonstration of equipment use and safety with use of the equipment.   Cardiac Rehab from 11/12/2017 in [facility] Cardiac and Pulmonary Rehab  Date  08/20/17  Educator  [facility]  Instruction Review Code  1- Verbalizes Understanding      Infection Prevention: - Provides verbal and written material to individual with discussion of infection control including proper hand washing and proper equipment cleaning during exercise session.   Cardiac Rehab from 11/12/2017 in [facility] Cardiac and Pulmonary Rehab  Date  08/20/17  Educator  [facility]  Instruction Review Code  1- Verbalizes Understanding      Falls Prevention: - Provides verbal and written material to individual with discussion of falls prevention and safety.   Cardiac Rehab from 11/12/2017 in [facility] Cardiac and Pulmonary Rehab  Date  08/20/17  Educator  [facility]  Instruction Review Code  1- Verbalizes Understanding      Diabetes: - Individual verbal and written instruction to review signs/symptoms of diabetes, desired ranges of glucose level fasting, after meals and with exercise. Acknowledge that pre and post exercise glucose checks will be done for 3 sessions at entry of program.   Cardiac Rehab from 11/12/2017 in [facility] Cardiac and Pulmonary Rehab  Date  08/20/17  Educator  [facility]  Instruction Review Code  1- Verbalizes Understanding      Know Your Numbers and Risk Factors: -Group verbal and written instruction  about important numbers in your health.  Discussion of what are risk factors and how they play a role in the disease process.  Review of Cholesterol, Blood Pressure, Diabetes, and BMI and the role they play in your overall health.   Cardiac Rehab from 11/12/2017 in [facility] Cardiac and Pulmonary Rehab  Date  09/17/17  Educator  CE  Instruction Review Code  1- Verbalizes Understanding      Sleep Hygiene: -Provides group verbal and written instruction about how sleep can affect your health.  Define sleep hygiene, discuss sleep cycles and impact of sleep habits. Review good sleep hygiene tips.    Cardiac Rehab from 11/12/2017 in [facility] Cardiac and Pulmonary Rehab  Date  10/06/17  Educator  [facility]  Instruction Review Code  1- Verbalizes Understanding      Other: -Provides group and verbal instruction on various topics (see comments)   Knowledge Questionnaire Score: Knowledge Questionnaire Score - 11/03/17 8937      Knowledge Questionnaire Score   Pre Score  26/28    Post Score  26/28       Core Components/Risk Factors/Patient Goals at Admission: Personal Goals and Risk Factors at Admission - 08/20/17 1305      Core Components/Risk Factors/Patient Goals on Admission    Weight Management  Yes;Weight Maintenance    Intervention  Weight Management: Develop a combined nutrition and exercise program designed to reach desired caloric intake, while maintaining appropriate intake of nutrient and fiber, sodium and fats, and appropriate energy expenditure required for the weight goal.;Weight Management: Provide education and appropriate resources to help participant work on and attain dietary goals.    Admit Weight  187 lb (84.8 kg)    Expected Outcomes  Short Term: Continue to assess and modify interventions until short term weight is achieved;Long Term: Adherence to nutrition and physical activity/exercise program aimed toward attainment of established weight goal;Weight Maintenance: Understanding of the  daily nutrition guidelines, which includes 25-35% calories from fat, 7% or less cal from saturated fats, less than 222m cholesterol, less than 1.5gm of sodium, & 5 or more servings of fruits and vegetables daily;Understanding recommendations for meals to include 15-35% energy as protein, 25-35% energy from fat, 35-60% energy from carbohydrates, less than 2064mof dietary cholesterol, 20-35 gm of total fiber daily;Understanding of distribution of calorie intake throughout the day with the consumption of 4-5 meals/snacks    Diabetes  Yes    Intervention  Provide education about signs/symptoms and action to take for hypo/hyperglycemia.;Provide education about proper nutrition, including hydration, and aerobic/resistive exercise prescription along with prescribed medications to achieve blood glucose in normal ranges: Fasting glucose 65-99 mg/dL    Expected Outcomes  Short Term: Participant verbalizes understanding of the signs/symptoms and immediate care of hyper/hypoglycemia, proper foot care and importance of medication, aerobic/resistive exercise and nutrition plan for blood glucose control.;Long Term: Attainment of HbA1C < 7%.    Hypertension  Yes    Intervention  Provide education on lifestyle modifcations including regular physical activity/exercise, weight management, moderate sodium restriction and increased consumption of fresh fruit, vegetables, and low fat dairy, alcohol moderation, and smoking cessation.;Monitor prescription use compliance.    Expected Outcomes  Short Term: Continued assessment and intervention until BP is < 140/9052mG in hypertensive participants. < 130/100m14m in hypertensive participants with diabetes, heart failure or chronic kidney disease.;Long Term: Maintenance of blood pressure at goal levels.       Core Components/Risk Factors/Patient Goals Review:  Goals and Risk Factor Review    Row Name 10/06/17 0842 11/03/17 0840           Core Components/Risk Factors/Patient  Goals Review   Personal Goals Review  Weight Management/Obesity;Diabetes;Hypertension;Lipids  Weight Management/Obesity;Diabetes;Hypertension;Lipids      Review  His weight was up today after missing last week.  He would like to get it back down.  His blood  sugars have been good.  He used to check daily in the morning, he is going to try to get back to checking more frequently.  Blood pressures have been good in class.  He has not been checking it at home.  He has a cuff and is going to bring it in to check against what we get in here and then start checking more at home.  He feels that his medications are working well for him.   Cameron [name] is doing well in rehab.  His weight has been holding fairly steady overall.  He fluctuates some from day to day.  He is still is not checking his blood pressures as he got rid of his cuff with its odd readings.   He is planning to have his doctor write him a prescirption for a new cuff to get insurance to pay for it.  He continues to check his blood sugars at home and they have been good.  He is still doing well on his medicaitons.         Expected Outcomes  Short: Bring in cuff and start checking more frequently at home. Long: Continue to work on weight loss.   Short: Contiue to work on weight loss.  Long: Continue to monitor risk factors.          Core Components/Risk Factors/Patient Goals at Discharge (Final Review):  Goals and Risk Factor Review - 11/03/17 0840      Core Components/Risk Factors/Patient Goals Review   Personal Goals Review  Weight Management/Obesity;Diabetes;Hypertension;Lipids    Review  Cameron [name] is doing well in rehab.  His weight has been holding fairly steady overall.  He fluctuates some from day to day.  He is still is not checking his blood pressures as he got rid of his cuff with its odd readings.   He is planning to have his doctor write him a prescirption for a new cuff to get insurance to pay for it.  He continues to check his blood sugars at  home and they have been good.  He is still doing well on his medicaitons.       Expected Outcomes  Short: Contiue to work on weight loss.  Long: Continue to monitor risk factors.        ITP Comments: ITP Comments    Row Name 08/20/17 1303 09/08/17 0933 09/16/17 0542 10/14/17 0558 11/11/17 0603   ITP Comments  Med Review completed. Initial ITP created. Diagnosis can be found in [facility] Encounter 08/11/17  Cameron [name] was complaining that his leg was hurting behind his knee.  RN looked at it and it was warm to touch and had some swelling.  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  He was encouraged to call the doctor to be seen.  He was able to exercise today and just went a little lighter on the second piece of equipment.   30 day review. Continue with ITP unless directed changes per Medical Director  30 day review. Continue with ITP unless directed changes per Medical Director  30 day review. Continue with ITP unless directed changes per Medical Director review  3 visits in June   Row Name 11/12/17 1036           ITP Comments  Discharge ITP sent and signed by Dr. Sabra Heck.  Discharge Summary routed to PCP and cardiologist.          Comments: Discharge ITP

## 2017-11-12 NOTE — Progress Notes (Signed)
Daily Session Note  Patient Details  [name] MRN: [id number] Date of Birth: 10/18/1940 Referring Provider:     Cardiac Rehab from 08/20/2017 in [facility]  Referring Provider  Allred, Mc MD      Encounter Date: 11/12/2017  Check In: Session Check In - 11/12/17 0817      Check-In   Location  [facility]    Staff Present  Jessica Hawkins, MA, RCEP, CCRP, Exercise Physiologist;Amanda Sommer, BA, ACSM CEP, Exercise Physiologist;Carroll Enterkin, RN, BSN    Supervising physician immediately available to respond to emergencies  See telemetry face sheet for immediately available ER MD    Medication changes reported      No    Fall or balance concerns reported     No    Warm-up and Cool-down  Performed on first and last piece of equipment    Resistance Training Performed  Yes    VAD Patient?  No      Pain Assessment   Currently in Pain?  No/denies          Social History   Tobacco Use  Smoking Status Former Smoker  . Packs/day: 1.00  . Years: 5.00  . Pack years: 5.00  . Types: Cigarettes  . Last attempt to quit: 07/10/1972  . Years since quitting: 45.3  Smokeless Tobacco Former User  . Types: Chew  . Quit date: 1975    Goals Met:  Independence with exercise equipment Exercise tolerated well Personal goals reviewed No report of cardiac concerns or symptoms Strength training completed today  Goals Unmet:  Not Applicable  Comments:  Cameron [name] graduated today from  rehab with 35 sessions completed.  Details of the patient's exercise prescription and what He needs to do in order to continue the prescription and progress were discussed with patient.  Patient was given a copy of prescription and goals.  Patient verbalized understanding.  Cameron [name] plans to continue to exercise by walking at home.    Dr. Mark Miller is Medical Director for [facility] and [facility]. 

## 2017-11-19 ENCOUNTER — Ambulatory Visit
Admission: RE | Admit: 2017-11-19 | Discharge: 2017-11-19 | Disposition: A | Discharge status: Home / Self Care | Payer: Medicare Other | Source: Ambulatory Visit | Attending: Internal Medicine | Admitting: Internal Medicine

## 2017-11-19 DIAGNOSIS — I6523 Occlusion and stenosis of bilateral carotid arteries: Secondary | ICD-10-CM | POA: Diagnosis not present

## 2017-11-19 DIAGNOSIS — R55 Syncope and collapse: Secondary | ICD-10-CM | POA: Diagnosis present

## 2017-11-19 DIAGNOSIS — G319 Degenerative disease of nervous system, unspecified: Secondary | ICD-10-CM | POA: Diagnosis not present

## 2017-11-19 DIAGNOSIS — R270 Ataxia, unspecified: Secondary | ICD-10-CM | POA: Diagnosis not present

## 2017-12-01 LAB — CUP PACEART REMOTE DEVICE CHECK
Battery Remaining Longevity: 53 mo
Battery Remaining Percentage: 46 %
Battery Voltage: 2.86 V
Brady Statistic AP VP Percent: 98 %
Brady Statistic AP VS Percent: 1 %
Brady Statistic AS VP Percent: 1.8 %
Brady Statistic AS VS Percent: 1 %
Brady Statistic RA Percent Paced: 98 %
Brady Statistic RV Percent Paced: 99 %
Date Time Interrogation Session: 20190619075405
Implantable Lead Implant Date: 20130716
Implantable Lead Implant Date: 20130716
Implantable Lead Location: 753859
Implantable Lead Location: 753860
Implantable Lead Model: 1948
Implantable Pulse Generator Implant Date: 20130716
Lead Channel Impedance Value: 330 Ohm
Lead Channel Impedance Value: 600 Ohm
Lead Channel Pacing Threshold Amplitude: 0.625 V
Lead Channel Pacing Threshold Amplitude: 0.625 V
Lead Channel Pacing Threshold Pulse Width: 0.5 ms
Lead Channel Pacing Threshold Pulse Width: 0.5 ms
Lead Channel Sensing Intrinsic Amplitude: 12 mV
Lead Channel Sensing Intrinsic Amplitude: 2 mV
Lead Channel Setting Pacing Amplitude: 0.875
Lead Channel Setting Pacing Amplitude: 1.625
Lead Channel Setting Pacing Pulse Width: 0.5 ms
Lead Channel Setting Sensing Sensitivity: 4 mV
Pulse Gen Model: 2210
Pulse Gen Serial Number: 7356500

## 2017-12-09 ENCOUNTER — Encounter: Payer: Self-pay | Admitting: Cardiovascular Disease

## 2017-12-09 ENCOUNTER — Ambulatory Visit (INDEPENDENT_AMBULATORY_CARE_PROVIDER_SITE_OTHER): Payer: Medicare Other | Admitting: Cardiovascular Disease

## 2017-12-09 VITALS — BP 123/59 | HR 65 | Ht 67.0 in | Wt 183.0 lb

## 2017-12-09 DIAGNOSIS — I25119 Atherosclerotic heart disease of native coronary artery with unspecified angina pectoris: Secondary | ICD-10-CM | POA: Diagnosis not present

## 2017-12-09 DIAGNOSIS — I442 Atrioventricular block, complete: Secondary | ICD-10-CM

## 2017-12-09 DIAGNOSIS — I2510 Atherosclerotic heart disease of native coronary artery without angina pectoris: Secondary | ICD-10-CM

## 2017-12-09 DIAGNOSIS — R0602 Shortness of breath: Secondary | ICD-10-CM

## 2017-12-09 DIAGNOSIS — E039 Hypothyroidism, unspecified: Secondary | ICD-10-CM

## 2017-12-09 DIAGNOSIS — E118 Type 2 diabetes mellitus with unspecified complications: Secondary | ICD-10-CM

## 2017-12-09 DIAGNOSIS — I10 Essential (primary) hypertension: Secondary | ICD-10-CM | POA: Diagnosis not present

## 2017-12-09 DIAGNOSIS — G4733 Obstructive sleep apnea (adult) (pediatric): Secondary | ICD-10-CM

## 2017-12-09 NOTE — Patient Instructions (Signed)
Medication Instructions:  Your physician recommends that you continue on your current medications as directed. Please refer to the Current Medication list given to you today.  Follow-Up: Your physician wants you to follow-up in: 6 months with Dr. Claiborne Billings.  You will receive a reminder letter in the mail two months in advance. If you don't receive a letter, please call our office to schedule the follow-up appointment.      If you need a refill on your cardiac medications before your next appointment, please call your pharmacy.
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
## 2017-12-16 ENCOUNTER — Encounter: Payer: Self-pay | Admitting: Cardiovascular Disease

## 2017-12-17 ENCOUNTER — Encounter: Payer: Medicare Other | Admitting: Physician Assistant

## 2017-12-30 ENCOUNTER — Other Ambulatory Visit: Payer: Self-pay | Admitting: Nurse Practitioner

## 2018-01-04 ENCOUNTER — Ambulatory Visit: Payer: Medicare Other | Admitting: Urology

## 2018-01-10 NOTE — Progress Notes (Signed)
Cardiology Office Note Date:  01/12/2018  Patient ID:  Cameron [name], Cameron [name] [birth year], MRN [id number] PCP:  [name], MD  Cardiologist: Dr. Claiborne Billings Electrophysiologist:  Dr.  Sherry Ruffing: Dr. Aundra Dubin Pulmonary: Dr. Annie Sable   Chief Complaint:  Planned EP f/u  History of Present Illness: Cameron [name] is a 77 y.o. male with history of CHB w/PPM, RA, HTN, hypothyroidism, OSA w/CPAP (Dr. Radford Pax), ?interstitial lung disease, CAD/ischemic heart disease (08/11/17, PCI to bifurcation stenosis of his circumflex and OM1 vessel, 2.5 x 15 mm Resolute stent was inserted into the OM 1 vessel at the ostium and a 3.0 x 18 mm Resolute DES stent was inserted from the proximal circumflex into the mid AV groove circumflex beyond the OM1 vessel), DM.  He comes in today to be seen for [name].  Last seen by him in April this year, SOB was improved post PCI, though not resolved, planned for AV optimization at his f/u visit, though was not optimistic that this would improve his symptoms to any significant degree.  He saw Dr. Claiborne Billings last month, without reported symptoms.  He is accompanied today by his wife.  He is feeling well.  Mentions generally doesn't feel like he has much energy, taking afternoon nap most days.  He denies any ongoing SOB or DOE since his PCI, no exertional intolerances at all.  He goes to an exercise class 3x week, works in his shop, yard, around the house without intolerances, no CP, palpitations, no near syncope or syncope.  Device information SJM dual chamber PPM, implanted 12/09/11 PACER DEPENDENT  Past Medical History:  Diagnosis Date  . Appendicitis   . Atrial fibrillation ([place]) 12/12/2013  . BPH (benign prostatic hypertrophy)   . CAD (coronary artery disease) 12/2015   Cath by Dr [name] reveals distal and small vessel CAD.  Medical therapy advised.  . Chest pain 12/03/2015  . CHF (congestive heart failure) ([place])   . 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 Coronary artery  disease   . Coronary artery disease involving native coronary artery of native heart with unstable angina pectoris ([place]) 08/11/2017  . History of blood transfusion 1968   "probably; related to getting wounded in [place]"  . History of kidney stones   . Hyperglycemia 11/05/2013  . Hyperlipidemia 11/05/2013  . Hypertension   . Hypothyroidism   . Hypothyroidism, unspecified 11/05/2013  . Inflammatory arthritis 11/05/2013  . Onychomycosis 12/20/2015  . OSA (obstructive sleep apnea) 10/26/2017    AHI of 8.1/h overall and 6.2/h during REM sleep.  AHI was 20/h while supine.  Oxygen saturations dropped to 87%.  Now on CPAP at 7cm H2O  . OSA on CPAP   . 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 Rheumatoid arthritis ([place])    "hands" (08/11/2017)  . Type II diabetes mellitus ([place])     Past Surgical History:  Procedure Laterality Date  . BACK SURGERY    . CARDIAC CATHETERIZATION N/A 12/28/2015   Procedure: Left Heart Cath and Coronary Angiography;  Surgeon: Belva Crome, MD;  Location: [facility];  Service: Cardiovascular;  Laterality: N/A;  . CATARACT EXTRACTION W/ INTRAOCULAR LENS  IMPLANT, BILATERAL Bilateral   . CORONARY ANGIOPLASTY WITH STENT PLACEMENT  08/11/2017   "2 stents"  . CORONARY STENT INTERVENTION N/A 08/11/2017   Procedure: CORONARY STENT INTERVENTION;  Surgeon: Troy Sine, MD;  Location: [facility];  Service: Cardiovascular;  Laterality:  N/A;  . CYSTOSCOPY W/ STONE MANIPULATION    . INGUINAL HERNIA REPAIR Left   . INSERT / REPLACE / REMOVE PACEMAKER  12/09/2011   SJM Accent DR RF implanted by DR Allred for complete heart block and syncope  . JOINT REPLACEMENT    . LAPAROSCOPIC CHOLECYSTECTOMY    . LITHOTRIPSY    . LUMBAR DISC SURGERY     "removed arthritis and spurs"  . PERMANENT PACEMAKER INSERTION N/A 12/09/2011   Procedure: PERMANENT PACEMAKER INSERTION;  Surgeon: [name], MD;  Location: [facility];   Service: Cardiovascular;  Laterality: N/A;  . REPLACEMENT TOTAL KNEE Right   . RIGHT/LEFT HEART CATH AND CORONARY ANGIOGRAPHY N/A 08/11/2017   Procedure: RIGHT/LEFT HEART CATH AND CORONARY ANGIOGRAPHY;  Surgeon: Larey Dresser, MD;  Location: [facility];  Service: Cardiovascular;  Laterality: N/A;  . TRANSURETHRAL RESECTION OF PROSTATE  2017/2018    Current Outpatient Medications  Medication Sig Dispense Refill  . aspirin EC 81 MG tablet Take 81 mg by mouth daily.    Marland Kitchen atorvastatin (LIPITOR) 40 MG tablet Take 1 tablet (40 mg total) by mouth daily at 6 PM. 30 tablet 6  . BLACK PEPPER-TURMERIC PO Take 1 capsule by mouth 2 (two) times daily.    . clopidogrel (PLAVIX) 75 MG tablet Take 1 tablet (75 mg total) by mouth daily with breakfast. 30 tablet 6  . Coenzyme Q10 (CO Q 10) 100 MG CAPS Take 100 mg by mouth daily.    . Cyanocobalamin (B-12) 5000 MCG CAPS Take 1 capsule by mouth daily.    Marland Kitchen glimepiride (AMARYL) 2 MG tablet Take 2 mg by mouth daily.    Marland Kitchen leflunomide (ARAVA) 10 MG tablet Take 1 tablet by mouth daily.    Marland Kitchen lisinopril (PRINIVIL,ZESTRIL) 10 MG tablet Take 1 tablet (10 mg total) by mouth daily. 90 tablet 3  . magnesium oxide (MAG-OX) 400 MG tablet Take 1 tablet by mouth daily.    . metFORMIN (GLUCOPHAGE) 500 MG tablet Take 500 mg by mouth 2 (two) times daily.    . mirabegron ER (MYRBETRIQ) 50 MG TB24 tablet Take 1 tablet (50 mg total) by mouth daily. 30 tablet 11  . nebivolol (BYSTOLIC) 5 MG tablet Take 1 tablet (5 mg total) by mouth daily. 30 tablet 6  . nitroGLYCERIN (NITROSTAT) 0.4 MG SL tablet DISSOLVE 1 TABLET UNDER THE TONGUE EVERY 5 MINUTES AS NEEDED FOR CHEST PAIN. 75 tablet 1  . Omega-3 Fatty Acids (OMEGA 3 PO) Take 1 tablet 2 (two) times daily by mouth.    . Red Yeast Rice Extract (RED YEAST RICE PO) Take 1 capsule by mouth daily.    Marland Kitchen thyroid (ARMOUR) 90 MG tablet Take 90 mg by mouth every morning.     . trospium (SANCTURA) 20 MG tablet Take 1 tablet by mouth  daily.    . TURMERIC CURCUMIN PO Take 1 capsule by mouth daily.     No current facility-administered medications for this visit.     Allergies:   Celecoxib   Social History:  The patient  reports that he quit smoking about 45 years ago. His smoking use included cigarettes. He has a 5.00 pack-year smoking history. He quit smokeless tobacco use about 44 years ago.  His smokeless tobacco use included chew. He reports that he drinks about 1.0 standard drinks of alcohol per week. He reports that he does not use drugs.   Family History:  The patient's family history includes Alzheimer's disease in his  paternal grandmother and sister; Arthritis in his maternal grandfather; Bone cancer in his father; Heart attack in his paternal uncle; Lung cancer in his paternal grandfather; Pancreatic cancer in his mother.  ROS:  Please see the history of present illness. All other systems are reviewed and otherwise negative.   PHYSICAL EXAM:  VS:  BP 126/68   Pulse 73   Ht _0  (1.702 m)   Wt 183 lb (83 kg)   BMI 28.66 kg/m  BMI: Body mass index is 28.66 kg/m. Well nourished, well developed, in no acute distress  HEENT: normocephalic, atraumatic  Neck: no JVD, carotid bruits or masses Cardiac: RRR; 1-2/6 SM, no rubs, or gallops Lungs:   CTA b/l, no wheezing, rhonchi or rales  Abd: soft, nontender MS: no deformity or atrophy Ext: no edema  Skin: warm and dry, no rash Neuro:  No gross deficits appreciated Psych: euthymic mood, full affect  PPM site is stable, no tethering or discomfort   EKG:  Not done today PPM interrogation today and reviewed by myself Battery and lead measurements are good, no R waves at 40bpm, known to be pacer dependent.  AMS episodes, EGMs available are reviewed,longest 6 seconds, likely AT  09/17/17 TTE Study Conclusions - Left ventricle: The cavity size was normal. Wall thickness was   increased in a pattern of mild LVH. There was mild focal basal   hypertrophy of the  septum. Systolic function was normal. The   estimated ejection fraction was in the range of 55% to 60%. Wall   motion was normal; there were no regional wall motion   abnormalities. Doppler parameters are consistent with abnormal   left ventricular relaxation (grade 1 diastolic dysfunction).   Doppler parameters are consistent with high ventricular filling   pressure. - Mitral valve: Calcified annulus. There was mild to moderate   regurgitation. - Left atrium: The atrium was mildly dilated. - Tricuspid valve: There was moderate regurgitation. - Pulmonary arteries: Systolic pressure was mildly increased. PA   peak pressure: 41 mm Hg (S). Impressions: - Normal LV systolic function; mild LVH; mild diastolic   dysfunction; mild to moderate MR; mild LAE; moderate TR; mild   pulmonary hypertension.   08/11/17: PCI Difficult but successful bifurcation stenting of a dominant left circumflex vessel with ultimate insertion of a 2.515 mm  Resolute DES stent into the OM1 one-vessel from its ostium and a 3.018 mm Resolute DES stent inserted extending from the proximal circumflex into the mid AV groove circumflex beyond the OM1 vessel.  The 80% circumflex marginal stenosis was reduced to 0% in the 90% AV groove circumflex stenosis was reduced to 0%. RECOMMENDATION: DAPT therapy for minimum of 1 year.  Medical therapy for concomitant CAD as noted in Dr. Claris Gladden report.   08/11/17: LHC 1. Normal left and right heart filling pressures.  2. 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  Would not treat with selective pulmonary vasodilators.  3. Severe disease in the proximal LCx, proximal OM1, and proximal D3.  I suspect that his dyspnea comes from a combination of parenchymal lung disease and coronary disease.  We will plan to intervene on proximal LCx and proximal OM1 today.  Can return to proximal D3 in the future if he remains symptomatic.    Dr. Claiborne Billings to  perform PCI.   12/18/15: Echocardiogram Study Conclusions - Left ventricle: The cavity size was normal. There was mild focal   basal hypertrophy of the septum. Systolic function was mildly  reduced. The estimated ejection fraction was in the range of 45%   to 50%. There is hypokinesis of the mid-apical anteroseptal   myocardium with asynchronous contraction (bundle branch block/   paced). Doppler parameters are consistent with abnormal left   ventricular relaxation (grade 1 diastolic dysfunction). Doppler   parameters are consistent with high ventricular filling pressure. - Aortic valve: Trileaflet; mildly thickened, mildly calcified   leaflets. - Mitral valve: Calcified annulus. Mildly thickened leaflets .   There was moderate regurgitation. - Left atrium: The atrium was moderately dilated. - Right ventricle: The cavity size was mildly dilated. Wall   thickness was normal. Pacer wire or catheter noted in right   ventricle. - Tricuspid valve: There was moderate regurgitation. - Pulmonary arteries: Systolic pressure was moderately increased.   PA peak pressure: 51 mm Hg (S).  12/18/15: Leane Call Study Highlights   Nuclear stress EF: 38%.  There was no ST segment deviation noted during stress.  No T wave inversion was noted during stress.  Defect 1: There is a small defect of moderate severity present in the apical inferior and apex location.  Findings consistent with ischemia.  This is an intermediate risk study.  The left ventricular ejection fraction is moderately decreased (30-44%).     Recent Labs: 04/02/2017: NT-Pro BNP 557 08/12/2017: BUN 21; Creatinine, Ser 0.99; Hemoglobin 11.5; Platelets 176; Potassium 4.3; Sodium 138  No results found for requested labs within last 8760 hours.   CrCl cannot be calculated (Patient's most recent lab result is older than the maximum 21 days allowed.).   Wt Readings from Last 3 Encounters:  01/12/18 183 lb (83 kg)    12/09/17 183 lb (83 kg)  11/03/17 185 lb (83.9 kg)     Other studies reviewed: Additional studies/records reviewed today include: summarized above  ASSESSMENT AND PLAN:  1. Complete heart block, PPM     Intact function, no changes made  There were thoughts to do AV opt, though given his significant improvement in his exertional capacity and resolved DOE, I don't think this is neccessary  2. HTN     Looks good, no changes  3. nonsustained atach/afib, noted 16 seconds in 2017     Longest 6 seconds this interrogation     follow  4. CAD, ischemic heart disease     S/p PCI as above 08/11/17    Good exertional capacity     On ASA/plavix, BB, statin    C/w Dr. Claiborne Billings        Disposition: continue Q  22monthremotes, see him back as scheduled next year    Current medicines are reviewed at length with the patient today.  The patient did not have any concerns regarding medicines.  SHaywood Lasso PA-C 01/12/2018 8:56 AM     [place]  [phone](office)  [phone](fax)

## 2018-01-12 ENCOUNTER — Ambulatory Visit (INDEPENDENT_AMBULATORY_CARE_PROVIDER_SITE_OTHER): Payer: Medicare Other | Admitting: Physician Assistant

## 2018-01-12 VITALS — BP 126/68 | HR 73 | Ht 67.0 in | Wt 183.0 lb

## 2018-01-12 DIAGNOSIS — I4891 Unspecified atrial fibrillation: Secondary | ICD-10-CM | POA: Diagnosis not present

## 2018-01-12 DIAGNOSIS — Z950 Presence of cardiac pacemaker: Secondary | ICD-10-CM

## 2018-01-12 DIAGNOSIS — I2510 Atherosclerotic heart disease of native coronary artery without angina pectoris: Secondary | ICD-10-CM

## 2018-01-12 DIAGNOSIS — I10 Essential (primary) hypertension: Secondary | ICD-10-CM

## 2018-01-12 DIAGNOSIS — I25119 Atherosclerotic heart disease of native coronary artery with unspecified angina pectoris: Secondary | ICD-10-CM

## 2018-01-12 NOTE — Patient Instructions (Addendum)
Medication Instructions:   Your physician recommends that you continue on your current medications as directed. Please refer to the Current Medication list given to you today.   If you need a refill on your cardiac medications before your next appointment, please call your pharmacy.  Labwork: NONE ORDERED  TODAY   Testing/Procedures: NONE ORDERED  TODAY   Follow-Up:  Your physician wants you to follow-up in: [place] will receive a reminder letter in the mail two months in advance. If you don't receive a letter, please call our office to schedule the follow-up appointment.    Remote monitoring is used to monitor your Pacemaker of ICD from home. This monitoring reduces the number of office visits required to check your device to one time per year. It allows [place] to keep an eye on the functioning of your device to ensure it is working properly. You are scheduled for a device check from home on . 02-10-18 You may send your transmission at any time that day. If you have a wireless device, the transmission will be sent automatically. After your physician reviews your transmission, you will receive a postcard with your next transmission date.     Any Other Special Instructions Will Be Listed Below (If Applicable).

## 2018-01-18 ENCOUNTER — Encounter: Payer: Self-pay | Admitting: Urology

## 2018-01-18 ENCOUNTER — Ambulatory Visit (INDEPENDENT_AMBULATORY_CARE_PROVIDER_SITE_OTHER): Payer: Medicare Other | Admitting: Urology

## 2018-01-18 VITALS — BP 157/75 | HR 88 | Ht 67.0 in | Wt 182.4 lb

## 2018-01-18 DIAGNOSIS — N3941 Urge incontinence: Secondary | ICD-10-CM

## 2018-01-18 DIAGNOSIS — N3281 Overactive bladder: Secondary | ICD-10-CM

## 2018-01-18 DIAGNOSIS — Z9079 Acquired absence of other genital organ(s): Secondary | ICD-10-CM

## 2018-01-18 NOTE — Progress Notes (Signed)
01/18/2018 1:34 PM   Cameron [name] [birth year] [id number]  Referring provider: [name], MD [facility] [place], [place] [address]  Chief Complaint  Patient presents with  . Follow-up   Urologic history: 1.  BPH with detrusor overactivity -Long history of storage related voiding symptoms who did well for several years on anticholinergic medication. -Worsening voiding symptoms and 2017 and a daily study showed bladder outlet obstruction with detrusor overactivity. -TURP July 2018 at [facility] -Significant urge incontinence post TUR however at his last follow-up February 2019 this had significantly improved  HPI: 77 year old male presents for a six-month follow-up visit.  At his last visit he had seen significant improvement in his urinary incontinence on Myrbetriq alone.  He states he discontinued this medication approximately 2 months after his last visit and had no worsening of his voiding symptoms.  He has rare episodes of urge incontinence.  He does have some frequency and urgency which is stable.  Review of his medication list is remarkable for trospium 20 mg daily.  Denies dysuria or gross hematuria.  PMH: Past Medical History:  Diagnosis Date  . Appendicitis   . Atrial fibrillation ([place]) 12/12/2013  . BPH (benign prostatic hypertrophy)   . CAD (coronary artery disease) 12/2015   Cath by Dr [name] reveals distal and small vessel CAD.  Medical therapy advised.  . Chest pain 12/03/2015  . CHF (congestive heart failure) ([place])   . 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 Coronary artery disease   . Coronary artery disease involving native coronary artery of native heart with unstable angina pectoris ([place]) 08/11/2017  . History of blood transfusion 1968   "probably; related to getting wounded in [place]"  . History of kidney stones   . Hyperglycemia 11/05/2013  . Hyperlipidemia 11/05/2013  . Hypertension   . Hypothyroidism   . Hypothyroidism,  unspecified 11/05/2013  . Inflammatory arthritis 11/05/2013  . Onychomycosis 12/20/2015  . OSA (obstructive sleep apnea) 10/26/2017    AHI of 8.1/h overall and 6.2/h during REM sleep.  AHI was 20/h while supine.  Oxygen saturations dropped to 87%.  Now on CPAP at 7cm H2O  . OSA on CPAP   . 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 Rheumatoid arthritis ([place])    "hands" (08/11/2017)  . Type II diabetes mellitus ([place])     Surgical History: Past Surgical History:  Procedure Laterality Date  . BACK SURGERY    . CARDIAC CATHETERIZATION N/A 12/28/2015   Procedure: Left Heart Cath and Coronary Angiography;  Surgeon: Belva Crome, MD;  Location: [facility];  Service: Cardiovascular;  Laterality: N/A;  . CATARACT EXTRACTION W/ INTRAOCULAR LENS  IMPLANT, BILATERAL Bilateral   . CORONARY ANGIOPLASTY WITH STENT PLACEMENT  08/11/2017   "2 stents"  . CORONARY STENT INTERVENTION N/A 08/11/2017   Procedure: CORONARY STENT INTERVENTION;  Surgeon: Troy Sine, MD;  Location: [facility];  Service: Cardiovascular;  Laterality: N/A;  . CYSTOSCOPY W/ STONE MANIPULATION    . INGUINAL HERNIA REPAIR Left   . INSERT / REPLACE / REMOVE PACEMAKER  12/09/2011   SJM Accent DR RF implanted by DR Allred for complete heart block and syncope  . JOINT REPLACEMENT    . LAPAROSCOPIC CHOLECYSTECTOMY    . LITHOTRIPSY    . LUMBAR DISC SURGERY     "removed arthritis and spurs"  . PERMANENT PACEMAKER INSERTION N/A 12/09/2011  Procedure: PERMANENT PACEMAKER INSERTION;  Surgeon: [name], MD;  Location: [facility] CATH LAB;  Service: Cardiovascular;  Laterality: N/A;  . REPLACEMENT TOTAL KNEE Right   . RIGHT/LEFT HEART CATH AND CORONARY ANGIOGRAPHY N/A 08/11/2017   Procedure: RIGHT/LEFT HEART CATH AND CORONARY ANGIOGRAPHY;  Surgeon: Larey Dresser, MD;  Location: [facility];  Service: Cardiovascular;  Laterality: N/A;  . TRANSURETHRAL RESECTION OF PROSTATE   2017/2018    Home Medications:  Allergies as of 01/18/2018      Reactions   Celecoxib Rash   Skin rash      Medication List        Accurate as of 01/18/18  1:34 PM. Always use your most recent med list.          aspirin EC 81 MG tablet Take 81 mg by mouth daily.   atorvastatin 40 MG tablet Commonly known as:  LIPITOR Take 1 tablet (40 mg total) by mouth daily at 6 PM.   B-12 5000 MCG Caps Take 1 capsule by mouth daily.   BLACK PEPPER-TURMERIC PO Take 1 capsule by mouth 2 (two) times daily.   clopidogrel 75 MG tablet Commonly known as:  PLAVIX Take 1 tablet (75 mg total) by mouth daily with breakfast.   Co Q 10 100 MG Caps Take 100 mg by mouth daily.   leflunomide 10 MG tablet Commonly known as:  ARAVA Take 1 tablet by mouth daily.   lisinopril 10 MG tablet Commonly known as:  PRINIVIL,ZESTRIL Take 1 tablet (10 mg total) by mouth daily.   magnesium oxide 400 MG tablet Commonly known as:  MAG-OX Take 1 tablet by mouth daily.   metFORMIN 500 MG tablet Commonly known as:  GLUCOPHAGE Take 500 mg by mouth 2 (two) times daily.   nebivolol 5 MG tablet Commonly known as:  BYSTOLIC Take 1 tablet (5 mg total) by mouth daily.   nitroGLYCERIN 0.4 MG SL tablet Commonly known as:  NITROSTAT DISSOLVE 1 TABLET UNDER THE TONGUE EVERY 5 MINUTES AS NEEDED FOR CHEST PAIN.   OMEGA 3 PO Take 1 tablet 2 (two) times daily by mouth.   RED YEAST RICE PO Take 1 capsule by mouth daily.   thyroid 90 MG tablet Commonly known as:  ARMOUR Take 90 mg by mouth every morning.   trospium 20 MG tablet Commonly known as:  SANCTURA Take 1 tablet by mouth daily.   TURMERIC CURCUMIN PO Take 1 capsule by mouth daily.       Allergies:  Allergies  Allergen Reactions  . Celecoxib Rash    Skin rash     Family History: Family History  Problem Relation Age of Onset  . Pancreatic cancer Mother   . Bone cancer Father   . Alzheimer's disease Sister   . Heart attack Paternal  Uncle   . Arthritis Maternal Grandfather   . Alzheimer's disease Paternal Grandmother   . Lung cancer Paternal Grandfather     Social History:  reports that he quit smoking about 45 years ago. His smoking use included cigarettes. He has a 5.00 pack-year smoking history. He quit smokeless tobacco use about 44 years ago.  His smokeless tobacco use included chew. He reports that he drinks about 1.0 standard drinks of alcohol per week. He reports that he does not use drugs.  ROS: UROLOGY Frequent Urination?: No Hard to postpone urination?: Yes Burning/pain with urination?: No Get up at night to urinate?: Yes Leakage of urine?: Yes Urine stream starts and stops?: No  Trouble starting stream?: No Do you have to strain to urinate?: No Blood in urine?: No Urinary tract infection?: No Sexually transmitted disease?: No Injury to kidneys or bladder?: No Painful intercourse?: No Weak stream?: No Erection problems?: Yes Penile pain?: No  Gastrointestinal Nausea?: No Vomiting?: No Indigestion/heartburn?: No Diarrhea?: No Constipation?: No  Constitutional Fever: No Night sweats?: No Weight loss?: No Fatigue?: No  Skin Skin rash/lesions?: Yes Itching?: No  Eyes Blurred vision?: No Double vision?: No  Ears/Nose/Throat Sore throat?: No Sinus problems?: No  Hematologic/Lymphatic Swollen glands?: No Easy bruising?: No  Cardiovascular Leg swelling?: No Chest pain?: No  Respiratory Cough?: No Shortness of breath?: No  Endocrine Excessive thirst?: No  Musculoskeletal Back pain?: No Joint pain?: No  Neurological Headaches?: No Dizziness?: No  Psychologic Depression?: No Anxiety?: No  Physical Exam: BP (!) 157/75 (BP Location: Left Arm, Patient Position: Sitting, Cuff Size: Normal)   Pulse 88   Ht _0  (1.702 m)   Wt 182 lb 6.4 oz (82.7 kg)   BMI 28.57 kg/m   Constitutional:  Alert and oriented, No acute distress. HEENT: [place] AT, moist mucus membranes.   Trachea midline, no masses. Cardiovascular: No clubbing, cyanosis, or edema. Respiratory: Normal respiratory effort, no increased work of breathing. GI: Abdomen is soft, nontender, nondistended, no abdominal masses GU: No CVA tenderness Lymph: No cervical or inguinal lymphadenopathy. Skin: No rashes, bruises or suspicious lesions. Neurologic: Grossly intact, no focal deficits, moving all 4 extremities. Psychiatric: Normal mood and affect.   Assessment & Plan:    1. Overactive detrusor He is presently doing well on a low-dose of trospium.  He may discontinue this medication and restart if his voiding symptoms worsen.  Follow-up annually.  2. S/P TURP His significant storage related voiding symptoms have improved.  3. Urge incontinence As above.  Return in about 1 year (around 01/19/2019) for Recheck.   Abbie Sons, [facility] [address] [place], [place] [address] [phone]

## 2018-01-21 ENCOUNTER — Ambulatory Visit: Payer: Medicare Other | Admitting: Cardiovascular Disease

## 2018-02-10 ENCOUNTER — Ambulatory Visit (INDEPENDENT_AMBULATORY_CARE_PROVIDER_SITE_OTHER): Payer: Medicare Other | Admitting: *Deleted

## 2018-02-10 DIAGNOSIS — I442 Atrioventricular block, complete: Secondary | ICD-10-CM

## 2018-02-10 NOTE — Progress Notes (Signed)
Remote pacemaker transmission.   

## 2018-02-18 ENCOUNTER — Encounter: Payer: Self-pay | Admitting: Urology

## 2018-03-01 ENCOUNTER — Telehealth: Payer: Self-pay | Admitting: *Deleted

## 2018-03-01 NOTE — Telephone Encounter (Signed)
   [facility] Pre-operative Risk Assessment    Request for surgical clearance:  1. What type of surgery is being performed? Cervical spine myelogram/CT   2. When is this surgery scheduled? TBD   3. What type of clearance is required (medical clearance vs. Pharmacy clearance to hold med vs. Both)? both  4. Are there any medications that need to be held prior to surgery and how long? Plavix, ASA   5. Practice name and name of physician performing surgery? [facility]  Dr. Ashok Pall  6. What is your office phone number [phone]    7.   What is your office fax number [phone] attn: Janett Billow  8.   Anesthesia type (None, local, MAC, general) ?    Cameron [name] A Anikin Prosser 03/01/2018, 2:03 PM  _________________________________________________________________   (provider comments below)

## 2018-03-03 ENCOUNTER — Encounter: Payer: Self-pay | Admitting: Internal Medicine

## 2018-03-03 ENCOUNTER — Ambulatory Visit (INDEPENDENT_AMBULATORY_CARE_PROVIDER_SITE_OTHER): Payer: Medicare Other | Admitting: Internal Medicine

## 2018-03-03 ENCOUNTER — Other Ambulatory Visit: Payer: Self-pay | Admitting: Neurosurgery

## 2018-03-03 ENCOUNTER — Ambulatory Visit (INDEPENDENT_AMBULATORY_CARE_PROVIDER_SITE_OTHER): Payer: Medicare Other | Admitting: *Deleted

## 2018-03-03 ENCOUNTER — Other Ambulatory Visit (HOSPITAL_COMMUNITY): Payer: Self-pay | Admitting: Neurosurgery

## 2018-03-03 VITALS — BP 128/68 | HR 61 | Ht 67.0 in | Wt 180.0 lb

## 2018-03-03 DIAGNOSIS — I25119 Atherosclerotic heart disease of native coronary artery with unspecified angina pectoris: Secondary | ICD-10-CM

## 2018-03-03 DIAGNOSIS — I442 Atrioventricular block, complete: Secondary | ICD-10-CM

## 2018-03-03 DIAGNOSIS — M5412 Radiculopathy, cervical region: Secondary | ICD-10-CM

## 2018-03-03 MED ORDER — NEBIVOLOL HCL 2.5 MG PO TABS: 2.5000 mg | ORAL_TABLET | Freq: Every day | ORAL | 3 refills | Status: DC
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
## 2018-03-03 NOTE — Telephone Encounter (Signed)
   Primary Cardiologist: [name], MD  Chart reviewed as part of pre-operative protocol coverage. Patient had last PCI on 08/05/17 and recommended  DAPT therapy for minimum of 1 year.  Dr. Claiborne Billings do u want to hold surgery until next year? If okay to proceed, he will need phone call.       [place], [place] 03/03/2018, 2:07 PM

## 2018-03-03 NOTE — Patient Instructions (Addendum)
Medication Instructions:  Your physician has recommended you make the following change in your medication:   DECREASE: bystolic to 2.5 mg once a day   If you need a refill on your cardiac medications before your next appointment, please call your pharmacy.   Lab work: None ordered  If you have labs (blood work) drawn today and your tests are completely normal, you will receive your results only by: Marland Kitchen MyChart Message (if you have MyChart) OR . A paper copy in the mail If you have any lab test that is abnormal or we need to change your treatment, we will call you to review the results.  Testing/Procedures: None ordered  Follow-Up: At [facility], you and your health needs are our priority.  As part of our continuing mission to provide you with exceptional heart care, we have created designated Provider Care Teams.  These Care Teams include your primary Cardiologist (physician) and Advanced Practice Providers (APPs -  Physician Assistants and Nurse Practitioners) who all work together to provide you with the care you need, when you need it. You will need a follow up appointment in 1 years.  Please call our office 2 months in advance to schedule this appointment.  You may see [name] or one of the following Advanced Practice Providers on your designated Care Team:   Chanetta Marshall, NP . Tommye Standard, PA-C  Remote monitoring is used to monitor your Pacemaker from home. This monitoring reduces the number of office visits required to check your device to one time per year. It allows [place] to keep an eye on the functioning of your device to ensure it is working properly. You are scheduled for a device check from home on 05/12/18. You may send your transmission at any time that day. If you have a wireless device, the transmission will be sent automatically. After your physician reviews your transmission, you will receive a postcard with your next transmission date.    Any Other Special  Instructions Will Be Listed Below (If Applicable).

## 2018-03-05 LAB — CUP PACEART INCLINIC DEVICE CHECK
Battery Remaining Longevity: 50 mo
Battery Remaining Percentage: 46 %
Battery Voltage: 2.86 V
Brady Statistic RA Percent Paced: 95 %
Brady Statistic RV Percent Paced: 99 %
Date Time Interrogation Session: 20191011144252
Implantable Lead Implant Date: 20130716
Implantable Lead Implant Date: 20130716
Implantable Lead Location: 753859
Implantable Lead Location: 753860
Implantable Lead Model: 1948
Implantable Pulse Generator Implant Date: 20130716
Lead Channel Impedance Value: 330 Ohm
Lead Channel Impedance Value: 550 Ohm
Lead Channel Pacing Threshold Amplitude: 0.5 V
Lead Channel Pacing Threshold Amplitude: 0.5 V
Lead Channel Pacing Threshold Pulse Width: 0.5 ms
Lead Channel Pacing Threshold Pulse Width: 0.5 ms
Lead Channel Sensing Intrinsic Amplitude: 2.1 mV
Lead Channel Setting Pacing Amplitude: 0.875
Lead Channel Setting Pacing Amplitude: 1.5 V
Lead Channel Setting Pacing Pulse Width: 0.5 ms
Lead Channel Setting Sensing Sensitivity: 4 mV
Pulse Gen Model: 2210
Pulse Gen Serial Number: 7356500

## 2018-03-05 MED ORDER — SODIUM CHLORIDE 0.9 % IV SOLN: 4.0000 mg | Freq: Four times a day (QID) | INTRAVENOUS | Status: DC | PRN

## 2018-03-05 NOTE — Telephone Encounter (Signed)
Ideally I would recommend the procedure be deferred for a year if possible.  However if the patient is in need of this procedure  Sooner would at least defer if possible until January to give him over 9 months of DAPT.  He would need to hold the aspirin and Plavix for 7 days in light of the spinal injection.

## 2018-03-05 NOTE — Progress Notes (Signed)
Pacemaker check in clinic. Normal device function. Thresholds, sensing, impedances consistent with previous measurements. Device programmed to maximize longevity. <1% AT/AF burden, max dur. 16sec - AT per EGMs. No high ventricular rates noted. Device programmed at appropriate safety margins. Histogram distribution appropriate for patient activity level. Device programmed to optimize intrinsic conduction. Estimated longevity 4.2-4.7 years. Patient will follow up via [facility] on 12/18, JA in 12 months. Patient education completed.

## 2018-03-09 NOTE — Telephone Encounter (Signed)
Primary Cardiologist:Thomas Claiborne Billings, MD  Chart reviewed as part of pre-operative protocol coverage.  See recommendations from Dr. Claiborne Billings.  The patient needs to postpone the procedure until at least Jan 2020 as he cannot hold ASA or Plavix until at least that time.  Pre-op covering staff: - Please schedule appointment with Dr. Claiborne Billings for Jan 2020 (needs follow up at that time and can discuss whether or not to hold antiplatelet Rx as well). - Please contact requesting surgeon's office to notify them the patient cannot hold the ASA or Plavix until at least Jan 2020. - Letter faxed to surgeon's office.  Note will be removed from preop pool.  Richardson Dopp, PA-C  03/09/2018, 4:57 PM

## 2018-03-10 NOTE — Telephone Encounter (Signed)
I called and s/w Janett Billow at [facility] to see if letter was recv'd with the recommendations for the pt. Janett Billow confirmed letter was recv'd and procedure has been cancelled. Janett Billow did states that the pt had stated to her that he has been having increased sob and was not seeing Dr. Claiborne Billings until 05/2018. Pt has been scheduled to see [name], PA 03/18/18 for further evaluation.

## 2018-03-11 ENCOUNTER — Ambulatory Visit (INDEPENDENT_AMBULATORY_CARE_PROVIDER_SITE_OTHER): Payer: Medicare Other | Admitting: Physician Assistant

## 2018-03-11 ENCOUNTER — Encounter: Payer: Self-pay | Admitting: Physician Assistant

## 2018-03-11 VITALS — BP 136/62 | HR 77 | Ht 67.0 in | Wt 182.4 lb

## 2018-03-11 DIAGNOSIS — I2510 Atherosclerotic heart disease of native coronary artery without angina pectoris: Secondary | ICD-10-CM

## 2018-03-11 DIAGNOSIS — I10 Essential (primary) hypertension: Secondary | ICD-10-CM

## 2018-03-11 DIAGNOSIS — Z9989 Dependence on other enabling machines and devices: Secondary | ICD-10-CM

## 2018-03-11 DIAGNOSIS — E039 Hypothyroidism, unspecified: Secondary | ICD-10-CM | POA: Diagnosis not present

## 2018-03-11 DIAGNOSIS — R200 Anesthesia of skin: Secondary | ICD-10-CM

## 2018-03-11 DIAGNOSIS — G4733 Obstructive sleep apnea (adult) (pediatric): Secondary | ICD-10-CM

## 2018-03-11 DIAGNOSIS — E876 Hypokalemia: Secondary | ICD-10-CM | POA: Diagnosis not present

## 2018-03-11 DIAGNOSIS — I25119 Atherosclerotic heart disease of native coronary artery with unspecified angina pectoris: Secondary | ICD-10-CM | POA: Diagnosis not present

## 2018-03-11 DIAGNOSIS — Z950 Presence of cardiac pacemaker: Secondary | ICD-10-CM

## 2018-03-11 DIAGNOSIS — E119 Type 2 diabetes mellitus without complications: Secondary | ICD-10-CM

## 2018-03-11 DIAGNOSIS — I480 Paroxysmal atrial fibrillation: Secondary | ICD-10-CM

## 2018-03-11 MED ORDER — LISINOPRIL 5 MG PO TABS: 5.0000 mg | ORAL_TABLET | Freq: Every day | ORAL | 3 refills | Status: DC
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
## 2018-03-11 NOTE — Patient Instructions (Addendum)
Medication Instructions:  DECREASE Lisinopril 37m Take 1 tablet once a day If you need a refill on your cardiac medications before your next appointment, please call your pharmacy.   Lab work: Your physician recommends that you return for lab work in: TODAY-BMET If you have labs (blood work) drawn today and your tests are completely normal, you will receive your results only by: .Marland KitchenMyChart Message (if you have MyChart) OR . A paper copy in the mail If you have any lab test that is abnormal or we need to change your treatment, we will call you to review the results.  Testing/Procedures: NONE  Follow-Up: [facility] and [facility] are our [facility].  As part of our continuing mission to provide you with exceptional heart care, we have created designated Provider Care Teams.  These Care Teams include your primary Cardiologist (physician) and Advanced Practice Providers (APPs -  Physician Assistants and Nurse Practitioners) who all work together to provide you with the care you need, when you need it. You will need a follow up appointment in 3 months.  Please call our office 2 months in advance to schedule this appointment.  You may see TShelva Majestic MD or one of the following Advanced Practice Providers on your designated Care Team: [place]. AFabian Sharp PA-C  Any Other Special Instructions Will Be Listed Below (If Applicable). KEEP YOURSELF HYDRATED CONTACT DR CABBELL FOR A FOLLOW UP

## 2018-03-13 ENCOUNTER — Encounter: Payer: Self-pay | Admitting: Physician Assistant

## 2018-03-15 LAB — CUP PACEART REMOTE DEVICE CHECK
Battery Remaining Longevity: 53 mo
Battery Remaining Percentage: 46 %
Battery Voltage: 2.86 V
Brady Statistic AP VP Percent: 93 %
Brady Statistic AP VS Percent: 1 %
Brady Statistic AS VP Percent: 6.7 %
Brady Statistic AS VS Percent: 1 %
Brady Statistic RA Percent Paced: 93 %
Brady Statistic RV Percent Paced: 99 %
Date Time Interrogation Session: 20190918062604
Implantable Lead Implant Date: 20130716
Implantable Lead Implant Date: 20130716
Implantable Lead Location: 753859
Implantable Lead Location: 753860
Implantable Lead Model: 1948
Implantable Pulse Generator Implant Date: 20130716
Lead Channel Impedance Value: 330 Ohm
Lead Channel Impedance Value: 590 Ohm
Lead Channel Pacing Threshold Amplitude: 0.5 V
Lead Channel Pacing Threshold Amplitude: 0.625 V
Lead Channel Pacing Threshold Pulse Width: 0.5 ms
Lead Channel Pacing Threshold Pulse Width: 0.5 ms
Lead Channel Sensing Intrinsic Amplitude: 12 mV
Lead Channel Sensing Intrinsic Amplitude: 2.1 mV
Lead Channel Setting Pacing Amplitude: 0.875
Lead Channel Setting Pacing Amplitude: 1.5 V
Lead Channel Setting Pacing Pulse Width: 0.5 ms
Lead Channel Setting Sensing Sensitivity: 4 mV
Pulse Gen Model: 2210
Pulse Gen Serial Number: 7356500

## 2018-03-18 ENCOUNTER — Ambulatory Visit: Payer: Medicare Other | Admitting: Physician Assistant

## 2018-03-18 ENCOUNTER — Encounter (HOSPITAL_COMMUNITY): Payer: Self-pay

## 2018-03-18 ENCOUNTER — Ambulatory Visit (HOSPITAL_COMMUNITY): Payer: Medicare Other

## 2018-03-19 ENCOUNTER — Other Ambulatory Visit: Payer: Self-pay | Admitting: Neurology

## 2018-03-19 DIAGNOSIS — M5412 Radiculopathy, cervical region: Secondary | ICD-10-CM

## 2018-03-22 LAB — BASIC METABOLIC PANEL
BUN/Creatinine Ratio: 20 (ref 10–24)
BUN: 18 mg/dL (ref 8–27)
CO2: 24 mmol/L (ref 20–29)
Calcium: 9.6 mg/dL (ref 8.6–10.2)
Chloride: 103 mmol/L (ref 96–106)
Creatinine, Ser: 0.91 mg/dL (ref 0.76–1.27)
GFR calc Af Amer: 94 mL/min/{1.73_m2} (ref >59)
GFR calc non Af Amer: 81 mL/min/{1.73_m2} (ref >59)
Glucose: 119 mg/dL — ABNORMAL HIGH (ref 65–99)
Potassium: 5.3 mmol/L — ABNORMAL HIGH (ref 3.5–5.2)
Sodium: 141 mmol/L (ref 134–144)

## 2018-03-25 ENCOUNTER — Ambulatory Visit
Admission: RE | Admit: 2018-03-25 | Discharge: 2018-03-25 | Disposition: A | Discharge status: Home / Self Care | Payer: Medicare Other | Source: Ambulatory Visit | Attending: Neurology | Admitting: Neurology

## 2018-03-25 DIAGNOSIS — M5412 Radiculopathy, cervical region: Secondary | ICD-10-CM | POA: Insufficient documentation

## 2018-03-25 DIAGNOSIS — M47892 Other spondylosis, cervical region: Secondary | ICD-10-CM | POA: Insufficient documentation

## 2018-03-25 DIAGNOSIS — M4802 Spinal stenosis, cervical region: Secondary | ICD-10-CM | POA: Insufficient documentation

## 2018-03-30 ENCOUNTER — Other Ambulatory Visit: Payer: Self-pay

## 2018-03-30 DIAGNOSIS — I25110 Atherosclerotic heart disease of native coronary artery with unstable angina pectoris: Secondary | ICD-10-CM

## 2018-03-30 DIAGNOSIS — N179 Acute kidney failure, unspecified: Secondary | ICD-10-CM

## 2018-04-02 LAB — BASIC METABOLIC PANEL
BUN/Creatinine Ratio: 26 — ABNORMAL HIGH (ref 10–24)
BUN: 24 mg/dL (ref 8–27)
CO2: 24 mmol/L (ref 20–29)
Calcium: 9.8 mg/dL (ref 8.6–10.2)
Chloride: 106 mmol/L (ref 96–106)
Creatinine, Ser: 0.94 mg/dL (ref 0.76–1.27)
GFR calc Af Amer: 89 mL/min/{1.73_m2} (ref >59)
GFR calc non Af Amer: 77 mL/min/{1.73_m2} (ref >59)
Glucose: 124 mg/dL — ABNORMAL HIGH (ref 65–99)
Potassium: 5.3 mmol/L — ABNORMAL HIGH (ref 3.5–5.2)
Sodium: 143 mmol/L (ref 134–144)

## 2018-04-26 ENCOUNTER — Ambulatory Visit: Payer: Medicare Other | Attending: Neurosurgery

## 2018-04-26 ENCOUNTER — Other Ambulatory Visit: Payer: Self-pay

## 2018-04-26 DIAGNOSIS — M6281 Muscle weakness (generalized): Secondary | ICD-10-CM

## 2018-04-26 DIAGNOSIS — M542 Cervicalgia: Secondary | ICD-10-CM | POA: Diagnosis present

## 2018-04-26 DIAGNOSIS — M5413 Radiculopathy, cervicothoracic region: Secondary | ICD-10-CM | POA: Diagnosis present

## 2018-04-26 DIAGNOSIS — M545 Low back pain, unspecified: Secondary | ICD-10-CM

## 2018-04-26 DIAGNOSIS — G8929 Other chronic pain: Secondary | ICD-10-CM | POA: Diagnosis present

## 2018-04-26 DIAGNOSIS — R262 Difficulty in walking, not elsewhere classified: Secondary | ICD-10-CM | POA: Diagnosis present

## 2018-04-26 DIAGNOSIS — M5416 Radiculopathy, lumbar region: Secondary | ICD-10-CM

## 2018-04-26 NOTE — Patient Instructions (Signed)
  Sitting on a chair with a back rest, place a towel at your low back and lean back comfortably against the chair.

## 2018-04-26 NOTE — Therapy (Signed)
[facility] [address], [place], [address] Phone: [phone]   Fax:  [phone]  Physical Therapy Evaluation  Patient Details  [name] MRN: [id number] Date of Birth: 1940/06/24 Referring Provider (PT): Malen Gauze, MD   Encounter Date: 04/26/2018  PT End of Session - 04/26/18 1447    Visit Number  1    Number of Visits  17    Date for PT Re-Evaluation  06/24/18    Authorization Type  1    Authorization Time Period  of 10 progress report    PT Start Time  1448    PT Stop Time  1603    PT Time Calculation (min)  75 min    Activity Tolerance  Patient tolerated treatment well    Behavior During Therapy  [facility] for tasks assessed/performed       Past Medical History:  Diagnosis Date  . Appendicitis   . Atrial fibrillation ([place]) 12/12/2013  . BPH (benign prostatic hypertrophy)   . CAD (coronary artery disease) 12/2015   Cath by Dr [name] reveals distal and small vessel CAD.  Medical therapy advised.  . Chest pain 12/03/2015  . CHF (congestive heart failure) ([place])   . 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 Coronary artery disease   . Coronary artery disease involving native coronary artery of native heart with unstable angina pectoris ([place]) 08/11/2017  . History of blood transfusion 1968   "probably; related to getting wounded in [place]"  . History of kidney stones   . Hyperglycemia 11/05/2013  . Hyperlipidemia 11/05/2013  . Hypertension   . Hypothyroidism   . Hypothyroidism, unspecified 11/05/2013  . Inflammatory arthritis 11/05/2013  . Onychomycosis 12/20/2015  . OSA (obstructive sleep apnea) 10/26/2017    AHI of 8.1/h overall and 6.2/h during REM sleep.  AHI was 20/h while supine.  Oxygen saturations dropped to 87%.  Now on CPAP at 7cm H2O  . OSA on CPAP   . 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 Rheumatoid arthritis ([place])    "hands"  (08/11/2017)  . Type II diabetes mellitus ([place])     Past Surgical History:  Procedure Laterality Date  . BACK SURGERY    . CARDIAC CATHETERIZATION N/A 12/28/2015   Procedure: Left Heart Cath and Coronary Angiography;  Surgeon: Belva Crome, MD;  Location: [facility];  Service: Cardiovascular;  Laterality: N/A;  . CATARACT EXTRACTION W/ INTRAOCULAR LENS  IMPLANT, BILATERAL Bilateral   . CORONARY ANGIOPLASTY WITH STENT PLACEMENT  08/11/2017   "2 stents"  . CORONARY STENT INTERVENTION N/A 08/11/2017   Procedure: CORONARY STENT INTERVENTION;  Surgeon: Troy Sine, MD;  Location: [facility];  Service: Cardiovascular;  Laterality: N/A;  . CYSTOSCOPY W/ STONE MANIPULATION    . INGUINAL HERNIA REPAIR Left   . INSERT / REPLACE / REMOVE PACEMAKER  12/09/2011   SJM Accent DR RF implanted by DR Allred for complete heart block and syncope  . JOINT REPLACEMENT    . LAPAROSCOPIC CHOLECYSTECTOMY    . LITHOTRIPSY    . LUMBAR DISC SURGERY     "removed arthritis and spurs"  . PERMANENT PACEMAKER INSERTION N/A 12/09/2011   Procedure: PERMANENT PACEMAKER INSERTION;  Surgeon: [name], MD;  Location: [facility];  Service: Cardiovascular;  Laterality: N/A;  . REPLACEMENT TOTAL KNEE Right   . RIGHT/LEFT HEART CATH AND CORONARY ANGIOGRAPHY  N/A 08/11/2017   Procedure: RIGHT/LEFT HEART CATH AND CORONARY ANGIOGRAPHY;  Surgeon: Larey Dresser, MD;  Location: [facility];  Service: Cardiovascular;  Laterality: N/A;  . TRANSURETHRAL RESECTION OF PROSTATE  2017/2018    There were no vitals filed for this visit.   Subjective Assessment - 04/26/18 1456    Subjective  Neck (across shoulders): 5/10 currently, 9/10 at most for the past month; L UE: 8/10 numbness currently, and 9/10 at worst for the past month. R UE:  5/10 numbness currently, 8/10 numbness at worst for the past month.   Back: 4/10 currently (pt sitting on chair),  8/10 at worst for the past month.   L LE: 5/10 numbness  currently, 8/10 numbness at most for the past month.   R LE numbness 4/10 currently, 6/10 at most for the past month.      Pertinent History  Myelopathy. Symptoms began about 3.5 months ago starting with his L hand and fingers and radiated up his L arm (along the ulnar and C8/T1 dermatome).  Pain stopped at his L shoulder.  Pt also states having pain across his shoulders,  Pt then had similar symptoms at same areas in his R UE. Pain then went down his B inner thighs L greater than the R down to his great toe.  Pt also states having low back pain.  Neck bothers pt more than low back.  Has a little bit of bladder incontinence since last year and does not know if Dr. Lacinda Axon knows about it. Has a little bit of a drip when he stands up at times.  Has bowel control.  Has not had any imaging for his low back in the last few years.   Pt is R hand dominant.  Denies numbness at S3-5 dermatomes.   Pt states he thinks Dr. Lacinda Axon knows about his B inner thigh numbness.    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       Patient Stated Goals  Be more stable and walk better.     Currently in Pain?  Yes    Pain Score  5    neck   Pain Location  Neck   and back, B UE and LE   Pain Orientation  Left;Right;Posterior;Lower;Upper    Pain Descriptors / Indicators  --   numbness, cold   Pain Onset  More than a month ago    Pain Frequency  Constant    Aggravating Factors   Neck (shoulder area): protracted neck such as sitting and watching TV. B UE: Pt does not know. B LE: Pt does not know. Low back: Standing up after sitting for a while, bending over,     Pain Relieving Factors  Neck (shoulder area): proper posture, chin tucks; Back: standing up straight.          [facility] PT Assessment - 04/26/18 1449      Assessment   Medical Diagnosis  Myelopathy    Referring Provider (PT)  Malen Gauze, MD    Onset Date/Surgical Date  04/06/18    date PT referral signed; chronic condition   Hand Dominance  Right      Precautions   Precaution Comments  Possible fall risk; pacemaker      Restrictions   Other Position/Activity Restrictions  no known weight bearing restrictions      Balance Screen  Has the patient fallen in the past 6 months  No    Has the patient had a decrease in activity level because of a fear of falling?   Yes    Is the patient reluctant to leave their home because of a fear of falling?   No      Home Environment   Additional Comments  Pt lives in a one story home with his wife. 3 steps in back door L rail assist (mainly uses back door). 5 steps at front door, no rails.       Prior Function   Vocation Requirements  PLOF: better able to ambulate more steadily      Observation/Other Assessments   Observations  Decreased B LE paresthesia with gentle R lateral shift correction.  TUG without AD 13.2 seconds average    Focus on Therapeutic Outcomes (FOTO)   lower leg FOTO: 30      Posture/Postural Control   Posture Comments  R lateral shift. B protracted shoulders and neck. L shoulder lower. Decreased B hip extension L > R. R iliac rest and R greater trochanter slightly lower       AROM   Lumbar Flexion  Not performed but pt states it bothers his back.     Lumbar Extension  WFL, no pain. Felt pretty good   decreased B LE paresthesia   Lumbar - Right Side [facility] with low back pain    Slight decrease in LE symptoms   Lumbar - Left Side Bend  Limited with sharp low back pain    Lumbar - Right Rotation  WFL   performed in sitting   Lumbar - Left Rotation  slightly limited compared to R rotation   performed in sitting     Strength   Right Hip Flexion  4-/5    Right Hip Extension  4-/5    Right Hip ABduction  4-/5    Left Hip Flexion  4-/5    Left Hip Extension  4-/5    Left Hip ABduction  4-/5    Right Knee Flexion  4+/5    Right Knee Extension  5/5    Left Knee Flexion  4/5    Left Knee Extension   5/5    Right Ankle Dorsiflexion  4/5    Left Ankle Dorsiflexion  4/5      Ambulation/Gait   Gait Comments  antalgic, decreased stance L LE, LOB x 1 to the R, R trunk lean, decreased bilateral hip extension. slight ataxic                Objective measurements completed on examination: See above findings.   Pacemaker   Blood pressure L arm sitting, mechanically taken, normal cuff: 133/57, HR 61   Pt states having bowel control. No symptoims at S3-S5 dermatomes  Decreased bilateral LE symptoms with gentle manual R lateral shift correction  Therapeutic exercise  Sitting with lumbar towel roll. 2 minutes. Decreased low back pain  Then with leaning back 2 minutes. Decreased bilateral inner thigh symptoms. Still has symptoms B toes  Reviewed saddle anesthesia and cauda equina syndrome and recommended pt to go to ER if he has symptoms. Pt verbalized understanding.   standing back extension gentle 10x5 second holds, then 3x5 second holds    Prone glute max/quad set 10x5 seconds each LE  No inner thigh and medial leg numbness after exercises. Just the toe numbness.   Improved exercise technique, movement at target joints, use of  target muscles after mod verbal, visual, tactile cues.   TUG no AD: 14.58 seconds, 13.02 seconds, 12 seconds (13.2 seconds average)    Patient is a 77 year old male who came to physical therapy secondary to myelopathy. He also presents with neck and back pain, radiating symptoms to bilateral upper and lower extremities, decreased LE paresthesia with back extension movements as well as with postural correction, bilateral LE weakness, altered gait pattern and posture, decreased balance, and difficulty performing functional tasks such as walking. Patient will benefit from skilled physical therapy services to address the aforementioned deficits.       PT Education - 04/26/18 1709    Education Details  ther-ex, plan of care, use of lumbar support when  sitting    Person(s) Educated  Patient    Methods  Explanation;Demonstration;Tactile cues;Verbal cues    Comprehension  Returned demonstration;Verbalized understanding       PT Short Term Goals - 04/26/18 1716      PT SHORT TERM GOAL #1   Title  Patient will be independent with his HEP to decrease pain, improve strength and function.     Time  3    Period  Weeks    Status  New    Target Date  05/20/18        PT Long Term Goals - 04/26/18 1716      PT LONG TERM GOAL #1   Title  Patient will have a decrease in neck pain to 4/10 or less at worst to promote ability to perform sitting tasks.    Baseline  9/10 neck pain at worst for the past month (04/26/2018)    Time  8    Period  Weeks    Status  New    Target Date  06/24/18      PT LONG TERM GOAL #2   Title  Patient will have a decrease in B UE paresthesia to 4/10 or less at worst to promote ability to perform functional tasks wiht his UE.     Baseline  Paresthesia: L UE 9/10, R UE 8/10 at worst for the past month (04/26/2018)    Time  8    Period  Weeks    Status  New    Target Date  06/24/18      PT LONG TERM GOAL #3   Title  Patient will have a decrease in back pain to 4/10 or less at worst to promote ability to ambulate, perform standing tasks.     Baseline  8/10 at worst for the past month (04/26/2018)    Time  8    Period  Weeks    Status  New    Target Date  06/24/18      PT LONG TERM GOAL #4   Title  Patient will have a decrease in bilateral LE paresthesia to to 3/10 or less at worst to promote ability to perform standing tasks as well as ambulate.     Baseline  Paresthesia: L LE 8/10, R LE 6/10 at worst for the past month (04/26/2018)    Time  8    Period  Weeks    Status  New    Target Date  06/24/18      PT LONG TERM GOAL #5   Title  Patient will improve TUG time to 12 seconds or less to promote functional mobility     Baseline  13.2 seconds average without AD (04/26/2018)    Time  8  Period  Weeks     Status  New    Target Date  06/24/18      Additional Long Term Goals   Additional Long Term Goals  Yes      PT LONG TERM GOAL #6   Title  Patient will improve his lower leg FOTO score by at least 10 points as a demonstration of improved function.     Baseline  30 (04/26/2018)    Time  8    Period  Weeks    Status  New    Target Date  06/24/18             Plan - 04/26/18 1710    Clinical Impression Statement  Patient is a 77 year old male who came to physical therapy secondary to myelopathy. He also presents with neck and back pain, radiating symptoms to bilateral upper and lower extremities, decreased LE paresthesia with back extension movements as well as with postural correction, bilateral LE weakness, altered gait pattern and posture, decreased balance, and difficulty performing functional tasks such as walking. Patient will benefit from skilled physical therapy services to address the aforementioned deficits.     History and Personal Factors relevant to plan of care:  multiple areas of paresthesia, neck and back pain, weakness, difficulty walking, performing standing tasks, medical history    Clinical Presentation  Evolving    Clinical Presentation due to:  symptoms seem to be worse compared to onset based on subjective reports    Clinical Decision Making  Moderate    Rehab Potential  Fair    Clinical Impairments Affecting Rehab Potential  (-) medical history, weakness, B UE and LE paresthesia, age, neck and back pain. (+) decreased bilateral LE paresthesia during evaluation, motivated.     PT Frequency  2x / week    PT Duration  8 weeks    PT Treatment/Interventions  Aquatic Therapy;Traction;Gait training;Therapeutic activities;Therapeutic exercise;Balance training;Neuromuscular re-education;Patient/family education;Manual techniques;Dry needling   traction if appropriate   PT Next Visit Plan  posture, gentle lateral shift correction, gentle back extension, scapular  strengthening, thoracic extension, glute and trunk strengthening     Consulted and Agree with Plan of Care  Patient       Patient will benefit from skilled therapeutic intervention in order to improve the following deficits and impairments:  Pain, Improper body mechanics, Postural dysfunction, Decreased strength, Difficulty walking, Decreased balance, Abnormal gait  Visit Diagnosis: Chronic bilateral low back pain, unspecified whether sciatica present - Plan: PT plan of care cert/re-cert  Radiculopathy, lumbar region - Plan: PT plan of care cert/re-cert  Muscle weakness (generalized) - Plan: PT plan of care cert/re-cert  Difficulty in walking, not elsewhere classified - Plan: PT plan of care cert/re-cert  Cervicalgia - Plan: PT plan of care cert/re-cert  Radiculopathy, cervicothoracic region - Plan: PT plan of care cert/re-cert     Problem List Patient Active Problem List   Diagnosis Date Noted  . OSA (obstructive sleep apnea) 10/26/2017  . Coronary artery disease involving native coronary artery of native heart with unstable angina pectoris ([place]) 08/11/2017  . Coronary artery disease   . 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 D-dimer, elevated 04/03/2017  . S/P TURP 01/05/2017  . Right lower quadrant abdominal pain   . Acute kidney injury ([place]) 09/09/2016  . Appendicitis   . Onychomycosis 12/20/2015  . Chest pain 12/03/2015  . Atrial fibrillation ([place]) 12/12/2013  . Arthritis 11/05/2013  . Hyperglycemia 11/05/2013  . Hyperglycemia, unspecified 11/05/2013  . Hyperlipidemia  11/05/2013  . Hyperlipidemia, unspecified 11/05/2013  . Hypothyroidism 11/05/2013  . Hypothyroidism, unspecified 11/05/2013  . Inflammatory arthritis 11/05/2013  . OA (osteoarthritis) 11/05/2013  . Osteoarthritis 11/05/2013  . Male erectile dysfunction 06/18/2012  . Urge incontinence 06/18/2012  . 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 Complete heart block ([place]) 12/09/2011  . Hypertension 12/09/2011  .  Rheumatoid arthritis(714.0) 12/09/2011  . Rheumatoid arthritis ([place]) 12/09/2011    Joneen Boers PT, DPT   04/26/2018, 7:07 PM  [facility] [address] [place], [address] Phone: [phone]   Fax:  [phone]  [name] MRN: [id number] Date of Birth: 02-17-1941

## 2018-04-27 ENCOUNTER — Telehealth: Payer: Self-pay | Admitting: Urology

## 2018-04-27 NOTE — Telephone Encounter (Signed)
Pt called office and said [facility] had taken him off of a medication Tamsulosin and he might want to start taking it again, because he is having a little incontinence, not much, but a little.  Or perhaps, try something else.  [phone]

## 2018-04-27 NOTE — Telephone Encounter (Signed)
Please advise thanks.

## 2018-04-28 ENCOUNTER — Ambulatory Visit: Payer: Medicare Other

## 2018-04-28 DIAGNOSIS — G9520 Unspecified cord compression: Secondary | ICD-10-CM | POA: Insufficient documentation

## 2018-04-28 MED ORDER — TAMSULOSIN HCL 0.4 MG PO CAPS: 0.4000 mg | ORAL_CAPSULE | Freq: Every day | ORAL | 0 refills | Status: DC

## 2018-04-28 NOTE — Telephone Encounter (Signed)
I sent a 30-day Rx tamsulosin to pharmacy.  If this is not effective can call back and we can try an alternative.

## 2018-05-03 ENCOUNTER — Ambulatory Visit: Payer: Medicare Other

## 2018-05-03 DIAGNOSIS — M542 Cervicalgia: Secondary | ICD-10-CM

## 2018-05-03 DIAGNOSIS — R262 Difficulty in walking, not elsewhere classified: Secondary | ICD-10-CM

## 2018-05-03 DIAGNOSIS — M5413 Radiculopathy, cervicothoracic region: Secondary | ICD-10-CM

## 2018-05-03 DIAGNOSIS — M545 Low back pain: Principal | ICD-10-CM

## 2018-05-03 DIAGNOSIS — M6281 Muscle weakness (generalized): Secondary | ICD-10-CM

## 2018-05-03 DIAGNOSIS — M5416 Radiculopathy, lumbar region: Secondary | ICD-10-CM

## 2018-05-03 DIAGNOSIS — G8929 Other chronic pain: Secondary | ICD-10-CM

## 2018-05-03 NOTE — Therapy (Signed)
[facility] [address], [place], [address] Phone: [phone]   Fax:  [phone]  Physical Therapy Treatment  Patient Details  [name] MRN: [id number] Date of Birth: [birth year] Referring Provider (PT): Malen Gauze, MD   Encounter Date: 05/03/2018  PT End of Session - 05/03/18 0950    Visit Number  2    Number of Visits  17    Date for PT Re-Evaluation  06/24/18    Authorization Type  2    Authorization Time Period  of 10 progress report    PT Start Time  0950    PT Stop Time  1034    PT Time Calculation (min)  44 min    Activity Tolerance  Patient tolerated treatment well    Behavior During Therapy  [facility] for tasks assessed/performed       Past Medical History:  Diagnosis Date  . Appendicitis   . Atrial fibrillation ([place]) 12/12/2013  . BPH (benign prostatic hypertrophy)   . CAD (coronary artery disease) 12/2015   Cath by Dr [name] reveals distal and small vessel CAD.  Medical therapy advised.  . Chest pain 12/03/2015  . CHF (congestive heart failure) ([place])   . 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 Coronary artery disease   . Coronary artery disease involving native coronary artery of native heart with unstable angina pectoris ([place]) 08/11/2017  . History of blood transfusion 1968   "probably; related to getting wounded in [place]"  . History of kidney stones   . Hyperglycemia 11/05/2013  . Hyperlipidemia 11/05/2013  . Hypertension   . Hypothyroidism   . Hypothyroidism, unspecified 11/05/2013  . Inflammatory arthritis 11/05/2013  . Onychomycosis 12/20/2015  . OSA (obstructive sleep apnea) 10/26/2017    AHI of 8.1/h overall and 6.2/h during REM sleep.  AHI was 20/h while supine.  Oxygen saturations dropped to 87%.  Now on CPAP at 7cm H2O  . OSA on CPAP   . 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 Rheumatoid arthritis ([place])    "hands"  (08/11/2017)  . Type II diabetes mellitus ([place])     Past Surgical History:  Procedure Laterality Date  . BACK SURGERY    . CARDIAC CATHETERIZATION N/A 12/28/2015   Procedure: Left Heart Cath and Coronary Angiography;  Surgeon: Belva Crome, MD;  Location: [facility];  Service: Cardiovascular;  Laterality: N/A;  . CATARACT EXTRACTION W/ INTRAOCULAR LENS  IMPLANT, BILATERAL Bilateral   . CORONARY ANGIOPLASTY WITH STENT PLACEMENT  08/11/2017   "2 stents"  . CORONARY STENT INTERVENTION N/A 08/11/2017   Procedure: CORONARY STENT INTERVENTION;  Surgeon: Troy Sine, MD;  Location: [facility];  Service: Cardiovascular;  Laterality: N/A;  . CYSTOSCOPY W/ STONE MANIPULATION    . INGUINAL HERNIA REPAIR Left   . INSERT / REPLACE / REMOVE PACEMAKER  12/09/2011   SJM Accent DR RF implanted by DR Allred for complete heart block and syncope  . JOINT REPLACEMENT    . LAPAROSCOPIC CHOLECYSTECTOMY    . LITHOTRIPSY    . LUMBAR DISC SURGERY     "removed arthritis and spurs"  . PERMANENT PACEMAKER INSERTION N/A 12/09/2011   Procedure: PERMANENT PACEMAKER INSERTION;  Surgeon: [name], MD;  Location: [facility];  Service: Cardiovascular;  Laterality: N/A;  . REPLACEMENT TOTAL KNEE Right   . RIGHT/LEFT HEART CATH AND CORONARY ANGIOGRAPHY  N/A 08/11/2017   Procedure: RIGHT/LEFT HEART CATH AND CORONARY ANGIOGRAPHY;  Surgeon: Larey Dresser, MD;  Location: [facility];  Service: Cardiovascular;  Laterality: N/A;  . TRANSURETHRAL RESECTION OF PROSTATE  2017/2018    There were no vitals filed for this visit.  Subjective Assessment - 05/03/18 0952    Subjective  Used the lumbar towel roll in the car but it bothered him. Used it out of the car and it seems to be helping him. No back pain currently. Very little bilateral inner thigh symptoms currently, 3/10 currently,     Pertinent History  Myelopathy. Symptoms began about 3.5 months ago starting with his L hand and fingers and  radiated up his L arm (along the ulnar and C8/T1 dermatome).  Pain stopped at his L shoulder.  Pt also states having pain across his shoulders,  Pt then had similar symptoms at same areas in his R UE. Pain then went down his B inner thighs L greater than the R down to his great toe.  Pt also states having low back pain.  Neck bothers pt more than low back.  Has a little bit of bladder incontinence since last year and does not know if Dr. Lacinda Axon knows about it. Has a little bit of a drip when he stands up at times.  Has bowel control.  Has not had any imaging for his low back in the last few years.   Pt is R hand dominant.  Denies numbness at S3-5 dermatomes.   Pt states he thinks Dr. Lacinda Axon knows about his B inner thigh numbness.    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       Patient Stated Goals  Be more stable and walk better.     Currently in Pain?  Yes    Pain Score  3    B LE symptoms.    Pain Onset  More than a month ago                               PT Education - 05/03/18 0957    Education Details  ther-ex    Person(s) Educated  Patient    Methods  Explanation;Tactile cues;Verbal cues;Demonstration    Comprehension  Verbalized understanding;Returned demonstration      Objectives    Does not know if he has a fusion in his back   Pacemaker   Medbridge Access Code: [email or web address]    Therapeutic exercise  Sitting with lumbar towel roll. 2 minutes.   Standing gentle R lateral shift correction  10x5 seconds for 3 sets   Then 4x10 seconds  Standing low rows yellow band 10x5 seconds for 3 sets standing scapular retraction red band 10x5 seconds for 3 sets Standing straight pallof press double yellow band 10x5 seconds for 3 sets   No B inner thigh symptoms after aforementioned exercises.    Prone glute max/quad set 10x5 seconds each LE for 3 sets   Seated ankle DF/PF 10x3    Improved exercise technique, movement at target joints, use of target muscles after mod verbal, visual, tactile cues.   Decreased LE symptoms after exercises to promote gentle trunk extension and muscle activation. Improved back and B LE symptoms compared to initial evaluation based on subjective reports.         PT Short  Term Goals - 04/26/18 1716      PT SHORT TERM GOAL #1   Title  Patient will be independent with his HEP to decrease pain, improve strength and function.     Time  3    Period  Weeks    Status  New    Target Date  05/20/18        PT Long Term Goals - 04/26/18 1716      PT LONG TERM GOAL #1   Title  Patient will have a decrease in neck pain to 4/10 or less at worst to promote ability to perform sitting tasks.    Baseline  9/10 neck pain at worst for the past month (04/26/2018)    Time  8    Period  Weeks    Status  New    Target Date  06/24/18      PT LONG TERM GOAL #2   Title  Patient will have a decrease in B UE paresthesia to 4/10 or less at worst to promote ability to perform functional tasks wiht his UE.     Baseline  Paresthesia: L UE 9/10, R UE 8/10 at worst for the past month (04/26/2018)    Time  8    Period  Weeks    Status  New    Target Date  06/24/18      PT LONG TERM GOAL #3   Title  Patient will have a decrease in back pain to 4/10 or less at worst to promote ability to ambulate, perform standing tasks.     Baseline  8/10 at worst for the past month (04/26/2018)    Time  8    Period  Weeks    Status  New    Target Date  06/24/18      PT LONG TERM GOAL #4   Title  Patient will have a decrease in bilateral LE paresthesia to to 3/10 or less at worst to promote ability to perform standing tasks as well as ambulate.     Baseline  Paresthesia: L LE 8/10, R LE 6/10 at worst for the past month (04/26/2018)    Time  8    Period  Weeks    Status  New    Target Date  06/24/18      PT LONG TERM GOAL #5   Title  Patient will improve TUG time  to 12 seconds or less to promote functional mobility     Baseline  13.2 seconds average without AD (04/26/2018)    Time  8    Period  Weeks    Status  New    Target Date  06/24/18      Additional Long Term Goals   Additional Long Term Goals  Yes      PT LONG TERM GOAL #6   Title  Patient will improve his lower leg FOTO score by at least 10 points as a demonstration of improved function.     Baseline  30 (04/26/2018)    Time  8    Period  Weeks    Status  New    Target Date  06/24/18            Plan - 05/03/18 0958    Clinical Impression Statement  Decreased LE symptoms after exercises to promote gentle trunk extension and muscle activation. Improved back and B LE symptoms compared to initial evaluation based on subjective reports.     Rehab Potential  Fair  Clinical Impairments Affecting Rehab Potential  (-) medical history, weakness, B UE and LE paresthesia, age, neck and back pain. (+) decreased bilateral LE paresthesia during evaluation, motivated.     PT Frequency  2x / week    PT Duration  8 weeks    PT Treatment/Interventions  Aquatic Therapy;Traction;Gait training;Therapeutic activities;Therapeutic exercise;Balance training;Neuromuscular re-education;Patient/family education;Manual techniques;Dry needling   traction if appropriate   PT Next Visit Plan  posture, gentle lateral shift correction, gentle back extension, scapular strengthening, thoracic extension, glute and trunk strengthening     Consulted and Agree with Plan of Care  Patient       Patient will benefit from skilled therapeutic intervention in order to improve the following deficits and impairments:  Pain, Improper body mechanics, Postural dysfunction, Decreased strength, Difficulty walking, Decreased balance, Abnormal gait  Visit Diagnosis: Chronic bilateral low back pain, unspecified whether sciatica present  Radiculopathy, lumbar region  Muscle weakness (generalized)  Difficulty in walking, not  elsewhere classified  Cervicalgia  Radiculopathy, cervicothoracic region     Problem List Patient Active Problem List   Diagnosis Date Noted  . OSA (obstructive sleep apnea) 10/26/2017  . Coronary artery disease involving native coronary artery of native heart with unstable angina pectoris ([place]) 08/11/2017  . Coronary artery disease   . 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 D-dimer, elevated 04/03/2017  . S/P TURP 01/05/2017  . Right lower quadrant abdominal pain   . Acute kidney injury ([place]) 09/09/2016  . Appendicitis   . Onychomycosis 12/20/2015  . Chest pain 12/03/2015  . Atrial fibrillation ([place]) 12/12/2013  . Arthritis 11/05/2013  . Hyperglycemia 11/05/2013  . Hyperglycemia, unspecified 11/05/2013  . Hyperlipidemia 11/05/2013  . Hyperlipidemia, unspecified 11/05/2013  . Hypothyroidism 11/05/2013  . Hypothyroidism, unspecified 11/05/2013  . Inflammatory arthritis 11/05/2013  . OA (osteoarthritis) 11/05/2013  . Osteoarthritis 11/05/2013  . Male erectile dysfunction 06/18/2012  . Urge incontinence 06/18/2012  . 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 Complete heart block ([place]) 12/09/2011  . Hypertension 12/09/2011  . Rheumatoid arthritis(714.0) 12/09/2011  . Rheumatoid arthritis ([place]) 12/09/2011    Joneen Boers PT, DPT   05/03/2018, 11:53 AM  [facility] [address], [place], [address] Phone: [phone]   Fax:  [phone]  [name] MRN: [id number] Date of Birth: 09-27-1940

## 2018-05-03 NOTE — Patient Instructions (Signed)
Medbridge Access Code: [id number]   Prone Gluteal Sets  10x3 with 5 second holds

## 2018-05-05 ENCOUNTER — Ambulatory Visit: Payer: Medicare Other | Admitting: Physical Therapy

## 2018-05-05 ENCOUNTER — Encounter: Payer: Self-pay | Admitting: Physical Therapy

## 2018-05-05 DIAGNOSIS — M6281 Muscle weakness (generalized): Secondary | ICD-10-CM

## 2018-05-05 DIAGNOSIS — M542 Cervicalgia: Secondary | ICD-10-CM

## 2018-05-05 DIAGNOSIS — M5416 Radiculopathy, lumbar region: Secondary | ICD-10-CM

## 2018-05-05 DIAGNOSIS — R262 Difficulty in walking, not elsewhere classified: Secondary | ICD-10-CM

## 2018-05-05 DIAGNOSIS — G8929 Other chronic pain: Secondary | ICD-10-CM

## 2018-05-05 DIAGNOSIS — M545 Low back pain: Principal | ICD-10-CM

## 2018-05-05 DIAGNOSIS — M5413 Radiculopathy, cervicothoracic region: Secondary | ICD-10-CM

## 2018-05-05 NOTE — Therapy (Signed)
[facility] [address], [place], [address] Phone: [phone]   Fax:  [phone]  Physical Therapy Treatment  Patient Details  [name] MRN: [id number] Date of Birth: May 29, 1940 Referring Provider (PT): Malen Gauze, MD   Encounter Date: 05/05/2018  PT End of Session - 05/05/18 1524    Visit Number  3    Number of Visits  17    Date for PT Re-Evaluation  06/24/18    Authorization Type  3    Authorization Time Period  of 10 progress report    PT Start Time  1520    PT Stop Time  1600    PT Time Calculation (min)  40 min    Activity Tolerance  Patient tolerated treatment well    Behavior During Therapy  [facility] for tasks assessed/performed       Past Medical History:  Diagnosis Date  . Appendicitis   . Atrial fibrillation ([place]) 12/12/2013  . BPH (benign prostatic hypertrophy)   . CAD (coronary artery disease) 12/2015   Cath by Dr [name] reveals distal and small vessel CAD.  Medical therapy advised.  . Chest pain 12/03/2015  . CHF (congestive heart failure) ([place])   . 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 Coronary artery disease   . Coronary artery disease involving native coronary artery of native heart with unstable angina pectoris ([place]) 08/11/2017  . History of blood transfusion 1968   "probably; related to getting wounded in [place]"  . History of kidney stones   . Hyperglycemia 11/05/2013  . Hyperlipidemia 11/05/2013  . Hypertension   . Hypothyroidism   . Hypothyroidism, unspecified 11/05/2013  . Inflammatory arthritis 11/05/2013  . Onychomycosis 12/20/2015  . OSA (obstructive sleep apnea) 10/26/2017    AHI of 8.1/h overall and 6.2/h during REM sleep.  AHI was 20/h while supine.  Oxygen saturations dropped to 87%.  Now on CPAP at 7cm H2O  . OSA on CPAP   . 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 Rheumatoid arthritis ([place])    "hands"  (08/11/2017)  . Type II diabetes mellitus ([place])     Past Surgical History:  Procedure Laterality Date  . BACK SURGERY    . CARDIAC CATHETERIZATION N/A 12/28/2015   Procedure: Left Heart Cath and Coronary Angiography;  Surgeon: Belva Crome, MD;  Location: [facility];  Service: Cardiovascular;  Laterality: N/A;  . CATARACT EXTRACTION W/ INTRAOCULAR LENS  IMPLANT, BILATERAL Bilateral   . CORONARY ANGIOPLASTY WITH STENT PLACEMENT  08/11/2017   "2 stents"  . CORONARY STENT INTERVENTION N/A 08/11/2017   Procedure: CORONARY STENT INTERVENTION;  Surgeon: Troy Sine, MD;  Location: [facility];  Service: Cardiovascular;  Laterality: N/A;  . CYSTOSCOPY W/ STONE MANIPULATION    . INGUINAL HERNIA REPAIR Left   . INSERT / REPLACE / REMOVE PACEMAKER  12/09/2011   SJM Accent DR RF implanted by DR Allred for complete heart block and syncope  . JOINT REPLACEMENT    . LAPAROSCOPIC CHOLECYSTECTOMY    . LITHOTRIPSY    . LUMBAR DISC SURGERY     "removed arthritis and spurs"  . PERMANENT PACEMAKER INSERTION N/A 12/09/2011   Procedure: PERMANENT PACEMAKER INSERTION;  Surgeon: [name], MD;  [facility];  Service: Cardiovascular;  Laterality: N/A;  . REPLACEMENT TOTAL KNEE Right   . RIGHT/LEFT HEART CATH AND CORONARY ANGIOGRAPHY  N/A 08/11/2017   Procedure: RIGHT/LEFT HEART CATH AND CORONARY ANGIOGRAPHY;  Surgeon: Larey Dresser, MD;  Location: [facility];  Service: Cardiovascular;  Laterality: N/A;  . TRANSURETHRAL RESECTION OF PROSTATE  2017/2018    There were no vitals filed for this visit.  Subjective Assessment - 05/05/18 1519    Subjective  Patient states that he went to the gym and worked out this morning. Patient denies any pain after exercising, but does note some when he first wakes up. Patient does not note any issue with the inner thighs today, but does state that he continues to have some numbness on his GTs. Patient went to the surgeon Lacinda Axon) yesterday  and was told that he is not any better but not any worse. Patient is waiting for clearance for a myelogram 2/2 to being w/i a year of a stent placement.    Pertinent History  Myelopathy. Symptoms began about 3.5 months ago starting with his L hand and fingers and radiated up his L arm (along the ulnar and C8/T1 dermatome).  Pain stopped at his L shoulder.  Pt also states having pain across his shoulders,  Pt then had similar symptoms at same areas in his R UE. Pain then went down his B inner thighs L greater than the R down to his great toe.  Pt also states having low back pain.  Neck bothers pt more than low back.  Has a little bit of bladder incontinence since last year and does not know if Dr. Lacinda Axon knows about it. Has a little bit of a drip when he stands up at times.  Has bowel control.  Has not had any imaging for his low back in the last few years.   Pt is R hand dominant.  Denies numbness at S3-5 dermatomes.   Pt states he thinks Dr. Lacinda Axon knows about his B inner thigh numbness.    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       Patient Stated Goals  Be more stable and walk better.     Currently in Pain?  Yes    Pain Score  3     Pain Location  Neck    Pain Orientation  Lower    Pain Type  Chronic pain    Pain Onset  More than a month ago    Pain Frequency  Constant       Therapeutic exercise   Sitting with lumbar towel roll. 2 minutes.    Standing gentle R lateral shift correction             10x5 seconds for 3 sets              Then 4x10 seconds   Standing low rows yellow band 10x5 seconds for 3 sets standing scapular retraction red band 10x5 seconds for 3 sets Standing pallof press double green band 10x5 seconds for 3 sets    Seated Glute sets 10 x 3 sec  PT Education - 05/05/18 1523    Education Details  exercise technique, actiity modification    Person(s) Educated  Patient    Methods   Explanation;Demonstration;Verbal cues    Comprehension  Verbalized understanding;Returned demonstration;Need further instruction       PT Short Term Goals - 04/26/18 1716      PT SHORT TERM GOAL #1   Title  Patient will be  independent with his HEP to decrease pain, improve strength and function.     Time  3    Period  Weeks    Status  New    Target Date  05/20/18        PT Long Term Goals - 04/26/18 1716      PT LONG TERM GOAL #1   Title  Patient will have a decrease in neck pain to 4/10 or less at worst to promote ability to perform sitting tasks.    Baseline  9/10 neck pain at worst for the past month (04/26/2018)    Time  8    Period  Weeks    Status  New    Target Date  06/24/18      PT LONG TERM GOAL #2   Title  Patient will have a decrease in B UE paresthesia to 4/10 or less at worst to promote ability to perform functional tasks wiht his UE.     Baseline  Paresthesia: L UE 9/10, R UE 8/10 at worst for the past month (04/26/2018)    Time  8    Period  Weeks    Status  New    Target Date  06/24/18      PT LONG TERM GOAL #3   Title  Patient will have a decrease in back pain to 4/10 or less at worst to promote ability to ambulate, perform standing tasks.     Baseline  8/10 at worst for the past month (04/26/2018)    Time  8    Period  Weeks    Status  New    Target Date  06/24/18      PT LONG TERM GOAL #4   Title  Patient will have a decrease in bilateral LE paresthesia to to 3/10 or less at worst to promote ability to perform standing tasks as well as ambulate.     Baseline  Paresthesia: L LE 8/10, R LE 6/10 at worst for the past month (04/26/2018)    Time  8    Period  Weeks    Status  New    Target Date  06/24/18      PT LONG TERM GOAL #5   Title  Patient will improve TUG time to 12 seconds or less to promote functional mobility     Baseline  13.2 seconds average without AD (04/26/2018)    Time  8    Period  Weeks    Status  New    Target Date  06/24/18       Additional Long Term Goals   Additional Long Term Goals  Yes      PT LONG TERM GOAL #6   Title  Patient will improve his lower leg FOTO score by at least 10 points as a demonstration of improved function.     Baseline  30 (04/26/2018)    Time  8    Period  Weeks    Status  New    Target Date  06/24/18            Plan - 05/05/18 1535    Clinical Impression Statement  Patient presents to clinic with reports of decreased symptoms and is amenable to therapy. Patient able to maintain erect posture against flexion force during resistant band exercises with minimal verbal cuing. Patient will continue to benefit from skilled therapeutic intervention to address deficits in pain, strength, and function which will improve overall QOL and safety.  Rehab Potential  Fair    Clinical Impairments Affecting Rehab Potential  (-) medical history, weakness, B UE and LE paresthesia, age, neck and back pain. (+) decreased bilateral LE paresthesia during evaluation, motivated.     PT Frequency  2x / week    PT Duration  8 weeks    PT Treatment/Interventions  Aquatic Therapy;Traction;Gait training;Therapeutic activities;Therapeutic exercise;Balance training;Neuromuscular re-education;Patient/family education;Manual techniques;Dry needling   traction if appropriate   PT Next Visit Plan  posture, gentle lateral shift correction, gentle back extension, scapular strengthening, thoracic extension, glute and trunk strengthening     Consulted and Agree with Plan of Care  Patient       Patient will benefit from skilled therapeutic intervention in order to improve the following deficits and impairments:  Pain, Improper body mechanics, Postural dysfunction, Decreased strength, Difficulty walking, Decreased balance, Abnormal gait  Visit Diagnosis: Chronic bilateral low back pain, unspecified whether sciatica present  Radiculopathy, lumbar region  Muscle weakness (generalized)  Difficulty in walking, not  elsewhere classified  Cervicalgia  Radiculopathy, cervicothoracic region     Problem List Patient Active Problem List   Diagnosis Date Noted  . OSA (obstructive sleep apnea) 10/26/2017  . Coronary artery disease involving native coronary artery of native heart with unstable angina pectoris ([place]) 08/11/2017  . Coronary artery disease   . 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 D-dimer, elevated 04/03/2017  . S/P TURP 01/05/2017  . Right lower quadrant abdominal pain   . Acute kidney injury ([place]) 09/09/2016  . Appendicitis   . Onychomycosis 12/20/2015  . Chest pain 12/03/2015  . Atrial fibrillation ([place]) 12/12/2013  . Arthritis 11/05/2013  . Hyperglycemia 11/05/2013  . Hyperglycemia, unspecified 11/05/2013  . Hyperlipidemia 11/05/2013  . Hyperlipidemia, unspecified 11/05/2013  . Hypothyroidism 11/05/2013  . Hypothyroidism, unspecified 11/05/2013  . Inflammatory arthritis 11/05/2013  . OA (osteoarthritis) 11/05/2013  . Osteoarthritis 11/05/2013  . Male erectile dysfunction 06/18/2012  . Urge incontinence 06/18/2012  . 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 Complete heart block ([place]) 12/09/2011  . Hypertension 12/09/2011  . Rheumatoid arthritis(714.0) 12/09/2011  . Rheumatoid arthritis ([place]) 12/09/2011   Myles Gip PT, DPT [phone] 05/05/2018, 3:40 PM  [facility] [address] [place], [address] Phone: [phone]   Fax:  [phone]  Name: MYCAH FORMICA MRN: [id number] Date of Birth: [birth year]

## 2018-05-10 ENCOUNTER — Ambulatory Visit: Payer: Medicare Other

## 2018-05-10 DIAGNOSIS — M6281 Muscle weakness (generalized): Secondary | ICD-10-CM

## 2018-05-10 DIAGNOSIS — M5413 Radiculopathy, cervicothoracic region: Secondary | ICD-10-CM

## 2018-05-10 DIAGNOSIS — G8929 Other chronic pain: Secondary | ICD-10-CM

## 2018-05-10 DIAGNOSIS — M545 Low back pain: Principal | ICD-10-CM

## 2018-05-10 DIAGNOSIS — M5416 Radiculopathy, lumbar region: Secondary | ICD-10-CM

## 2018-05-10 DIAGNOSIS — R262 Difficulty in walking, not elsewhere classified: Secondary | ICD-10-CM

## 2018-05-10 DIAGNOSIS — M542 Cervicalgia: Secondary | ICD-10-CM
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
## 2018-05-12 ENCOUNTER — Ambulatory Visit (INDEPENDENT_AMBULATORY_CARE_PROVIDER_SITE_OTHER): Payer: Medicare Other

## 2018-05-12 ENCOUNTER — Ambulatory Visit: Payer: Medicare Other

## 2018-05-12 DIAGNOSIS — M5416 Radiculopathy, lumbar region: Secondary | ICD-10-CM

## 2018-05-12 DIAGNOSIS — I442 Atrioventricular block, complete: Secondary | ICD-10-CM | POA: Diagnosis not present

## 2018-05-12 DIAGNOSIS — M5413 Radiculopathy, cervicothoracic region: Secondary | ICD-10-CM

## 2018-05-12 DIAGNOSIS — M542 Cervicalgia: Secondary | ICD-10-CM

## 2018-05-12 DIAGNOSIS — R262 Difficulty in walking, not elsewhere classified: Secondary | ICD-10-CM

## 2018-05-12 DIAGNOSIS — M545 Low back pain: Secondary | ICD-10-CM | POA: Diagnosis not present

## 2018-05-12 DIAGNOSIS — G8929 Other chronic pain: Secondary | ICD-10-CM

## 2018-05-12 DIAGNOSIS — M6281 Muscle weakness (generalized): Secondary | ICD-10-CM

## 2018-05-12 NOTE — Progress Notes (Signed)
Remote pacemaker transmission.   
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
## 2018-05-13 ENCOUNTER — Encounter: Payer: Self-pay | Admitting: Cardiology

## 2018-05-17 ENCOUNTER — Ambulatory Visit: Payer: Medicare Other

## 2018-05-17 DIAGNOSIS — G8929 Other chronic pain: Secondary | ICD-10-CM

## 2018-05-17 DIAGNOSIS — M545 Low back pain: Principal | ICD-10-CM

## 2018-05-17 DIAGNOSIS — M6281 Muscle weakness (generalized): Secondary | ICD-10-CM

## 2018-05-17 DIAGNOSIS — M5413 Radiculopathy, cervicothoracic region: Secondary | ICD-10-CM

## 2018-05-17 DIAGNOSIS — M542 Cervicalgia: Secondary | ICD-10-CM

## 2018-05-17 DIAGNOSIS — R262 Difficulty in walking, not elsewhere classified: Secondary | ICD-10-CM

## 2018-05-17 DIAGNOSIS — M5416 Radiculopathy, lumbar region: Secondary | ICD-10-CM

## 2018-05-17 NOTE — Therapy (Signed)
[facility] [address] [place], [address] Phone: [phone]   Fax:  [phone]  Physical Therapy Treatment  Patient Details  [name] MRN: [id number] Date of Birth: December 09, 1940 Referring Provider (PT): Malen Gauze, MD   Encounter Date: 05/17/2018  PT End of Session - 05/17/18 1526    Visit Number  6    Number of Visits  17    Date for PT Re-Evaluation  06/24/18    Authorization Type  6    Authorization Time Period  of 10 progress report    PT Start Time  1527    PT Stop Time  1609    PT Time Calculation (min)  42 min    Activity Tolerance  Patient tolerated treatment well    Behavior During Therapy  [facility] for tasks assessed/performed       Past Medical History:  Diagnosis Date  . Appendicitis   . Atrial fibrillation ([place]) 12/12/2013  . BPH (benign prostatic hypertrophy)   . CAD (coronary artery disease) 12/2015   Cath by Dr [name] reveals distal and small vessel CAD.  Medical therapy advised.  . Chest pain 12/03/2015  . CHF (congestive heart failure) ([place])   . 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 Coronary artery disease   . Coronary artery disease involving native coronary artery of native heart with unstable angina pectoris ([place]) 08/11/2017  . History of blood transfusion 1968   "probably; related to getting wounded in [place]"  . History of kidney stones   . Hyperglycemia 11/05/2013  . Hyperlipidemia 11/05/2013  . Hypertension   . Hypothyroidism   . Hypothyroidism, unspecified 11/05/2013  . Inflammatory arthritis 11/05/2013  . Onychomycosis 12/20/2015  . OSA (obstructive sleep apnea) 10/26/2017    AHI of 8.1/h overall and 6.2/h during REM sleep.  AHI was 20/h while supine.  Oxygen saturations dropped to 87%.  Now on CPAP at 7cm H2O  . OSA on CPAP   . 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 Rheumatoid arthritis ([place])    "hands"  (08/11/2017)  . Type II diabetes mellitus ([place])     Past Surgical History:  Procedure Laterality Date  . BACK SURGERY    . CARDIAC CATHETERIZATION N/A 12/28/2015   Procedure: Left Heart Cath and Coronary Angiography;  Surgeon: Belva Crome, MD;  Location: [facility];  Service: Cardiovascular;  Laterality: N/A;  . CATARACT EXTRACTION W/ INTRAOCULAR LENS  IMPLANT, BILATERAL Bilateral   . CORONARY ANGIOPLASTY WITH STENT PLACEMENT  08/11/2017   "2 stents"  . CORONARY STENT INTERVENTION N/A 08/11/2017   Procedure: CORONARY STENT INTERVENTION;  Surgeon: Troy Sine, MD;  Location: [facility];  Service: Cardiovascular;  Laterality: N/A;  . CYSTOSCOPY W/ STONE MANIPULATION    . INGUINAL HERNIA REPAIR Left   . INSERT / REPLACE / REMOVE PACEMAKER  12/09/2011   SJM Accent DR RF implanted by DR Allred for complete heart block and syncope  . JOINT REPLACEMENT    . LAPAROSCOPIC CHOLECYSTECTOMY    . LITHOTRIPSY    . LUMBAR DISC SURGERY     "removed arthritis and spurs"  . PERMANENT PACEMAKER INSERTION N/A 12/09/2011   Procedure: PERMANENT PACEMAKER INSERTION;  Surgeon: [name], MD;  Location: [facility] CATH LAB;  Service: Cardiovascular;  Laterality: N/A;  . REPLACEMENT TOTAL KNEE Right   . RIGHT/LEFT HEART CATH AND CORONARY ANGIOGRAPHY  N/A 08/11/2017   Procedure: RIGHT/LEFT HEART CATH AND CORONARY ANGIOGRAPHY;  Surgeon: Larey Dresser, MD;  Location: [facility];  Service: Cardiovascular;  Laterality: N/A;  . TRANSURETHRAL RESECTION OF PROSTATE  2017/2018    There were no vitals filed for this visit.  Subjective Assessment - 05/17/18 1528    Subjective  neck, back, R hand, LE feel a little better. The inner thighs are no longer bothering him. The feet still gets a little numb but seems to be getting better. Was running late to his appointment today.  Walking and balance is a little better.    Wants to avoid a surgery.     Pertinent History  Myelopathy. Symptoms began about  3.5 months ago starting with his L hand and fingers and radiated up his L arm (along the ulnar and C8/T1 dermatome).  Pain stopped at his L shoulder.  Pt also states having pain across his shoulders,  Pt then had similar symptoms at same areas in his R UE. Pain then went down his B inner thighs L greater than the R down to his great toe.  Pt also states having low back pain.  Neck bothers pt more than low back.  Has a little bit of bladder incontinence since last year and does not know if Dr. Lacinda Axon knows about it. Has a little bit of a drip when he stands up at times.  Has bowel control.  Has not had any imaging for his low back in the last few years.   Pt is R hand dominant.  Denies numbness at S3-5 dermatomes.   Pt states he thinks Dr. Lacinda Axon knows about his B inner thigh numbness.    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       Patient Stated Goals  Be more stable and walk better.     Currently in Pain?  Other (Comment)    Pain Score  --   no complain of pain.    Pain Onset  More than a month ago                               PT Education - 05/17/18 1533    Education Details  ther-ex    Person(s) Educated  Patient    Methods  Explanation;Demonstration;Tactile cues;Verbal cues    Comprehension  Returned demonstration;Verbalized understanding      Objectives   Does not know if he has a fusion in his back  Pacemaker  no latex allergies   MedbridgeAccess Code: [email or web address]  Gait: R pelvic drop during L LE stance phase, increased medial knee pressure during R LE stance phase afterwards.       Therapeutic exercise  Standing B shoulder extension yellow band 10x5 seconds for 3 sets  Standing LE leg press resisting double blue band with B UE assist 10x3 each LE  Sitting with upright posture   Manual trunk perturbation from PT 1 minute x 3   Manually resisted  trunk extension 10x5 seconds for 2 sets   Standing hip abduction with B UE assist 2 lbs 10x3 each LE  Stepping over 4 mini hurdles without UE assist 8x.CGA. Some unsteadiness. LOB x 1 when pt was on L LE.    SLS with B UE assist, and emphasis on level pelvis to promote glute med muscle  strength             L LE 10x5 seconds for 2 sets             R LE 10x5 seconds for 2 sets   L glute med muscle fatigue observed   Improved exercise technique, movement at target joints, use of target muscles after mod verbal, visual, tactile cues.   Worked on trunk muscle strengthening, glute max and med muscle strengthening as well as obstacle negotiation today to promote ability to ambulate and decrease fall risk. Improved steadiness with gait observed after session. Pt demonstrates overall improved neck, back, R UE and B LE pain and symptoms as well based on subjective reports. Pt will benefit from continued skilled physical therapy services to decrease pain, improve strength, and function.      PT Short Term Goals - 04/26/18 1716      PT SHORT TERM GOAL #1   Title  Patient will be independent with his HEP to decrease pain, improve strength and function.     Time  3    Period  Weeks    Status  New    Target Date  05/20/18        PT Long Term Goals - 04/26/18 1716      PT LONG TERM GOAL #1   Title  Patient will have a decrease in neck pain to 4/10 or less at worst to promote ability to perform sitting tasks.    Baseline  9/10 neck pain at worst for the past month (04/26/2018)    Time  8    Period  Weeks    Status  New    Target Date  06/24/18      PT LONG TERM GOAL #2   Title  Patient will have a decrease in B UE paresthesia to 4/10 or less at worst to promote ability to perform functional tasks wiht his UE.     Baseline  Paresthesia: L UE 9/10, R UE 8/10 at worst for the past month (04/26/2018)    Time  8    Period  Weeks    Status  New    Target Date  06/24/18      PT LONG TERM GOAL  #3   Title  Patient will have a decrease in back pain to 4/10 or less at worst to promote ability to ambulate, perform standing tasks.     Baseline  8/10 at worst for the past month (04/26/2018)    Time  8    Period  Weeks    Status  New    Target Date  06/24/18      PT LONG TERM GOAL #4   Title  Patient will have a decrease in bilateral LE paresthesia to to 3/10 or less at worst to promote ability to perform standing tasks as well as ambulate.     Baseline  Paresthesia: L LE 8/10, R LE 6/10 at worst for the past month (04/26/2018)    Time  8    Period  Weeks    Status  New    Target Date  06/24/18      PT LONG TERM GOAL #5   Title  Patient will improve TUG time to 12 seconds or less to promote functional mobility     Baseline  13.2 seconds average without AD (04/26/2018)    Time  8    Period  Weeks    Status  New    Target  Date  06/24/18      Additional Long Term Goals   Additional Long Term Goals  Yes      PT LONG TERM GOAL #6   Title  Patient will improve his lower leg FOTO score by at least 10 points as a demonstration of improved function.     Baseline  30 (04/26/2018)    Time  8    Period  Weeks    Status  New    Target Date  06/24/18            Plan - 05/17/18 1526    Clinical Impression Statement  Worked on trunk muscle strengthening, glute max and med muscle strengthening as well as obstacle negotiation today to promote ability to ambulate and decrease fall risk. Improved steadiness with gait observed after session. Pt demonstrates overall improved neck, back, R UE and B LE pain and symptoms as well based on subjective reports. Pt will benefit from continued skilled physical therapy services to decrease pain, improve strength, and function.     Rehab Potential  Fair    Clinical Impairments Affecting Rehab Potential  (-) medical history, weakness, B UE and LE paresthesia, age, neck and back pain. (+) decreased bilateral LE paresthesia during evaluation, motivated.      PT Frequency  2x / week    PT Duration  8 weeks    PT Treatment/Interventions  Aquatic Therapy;Traction;Gait training;Therapeutic activities;Therapeutic exercise;Balance training;Neuromuscular re-education;Patient/family education;Manual techniques;Dry needling   traction if appropriate   PT Next Visit Plan  posture, gentle lateral shift correction, gentle back extension, scapular strengthening, thoracic extension, glute and trunk strengthening     Consulted and Agree with Plan of Care  Patient       Patient will benefit from skilled therapeutic intervention in order to improve the following deficits and impairments:  Pain, Improper body mechanics, Postural dysfunction, Decreased strength, Difficulty walking, Decreased balance, Abnormal gait  Visit Diagnosis: Chronic bilateral low back pain, unspecified whether sciatica present  Radiculopathy, lumbar region  Muscle weakness (generalized)  Difficulty in walking, not elsewhere classified  Cervicalgia  Radiculopathy, cervicothoracic region     Problem List Patient Active Problem List   Diagnosis Date Noted  . OSA (obstructive sleep apnea) 10/26/2017  . Coronary artery disease involving native coronary artery of native heart with unstable angina pectoris ([place]) 08/11/2017  . Coronary artery disease   . 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 D-dimer, elevated 04/03/2017  . S/P TURP 01/05/2017  . Right lower quadrant abdominal pain   . Acute kidney injury ([place]) 09/09/2016  . Appendicitis   . Onychomycosis 12/20/2015  . Chest pain 12/03/2015  . Atrial fibrillation ([place]) 12/12/2013  . Arthritis 11/05/2013  . Hyperglycemia 11/05/2013  . Hyperglycemia, unspecified 11/05/2013  . Hyperlipidemia 11/05/2013  . Hyperlipidemia, unspecified 11/05/2013  . Hypothyroidism 11/05/2013  . Hypothyroidism, unspecified 11/05/2013  . Inflammatory arthritis 11/05/2013  . OA (osteoarthritis) 11/05/2013  . Osteoarthritis 11/05/2013  . Male  erectile dysfunction 06/18/2012  . Urge incontinence 06/18/2012  . 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 Complete heart block ([place]) 12/09/2011  . Hypertension 12/09/2011  . Rheumatoid arthritis(714.0) 12/09/2011  . Rheumatoid arthritis ([place]) 12/09/2011    Joneen Boers PT, DPT   05/17/2018, 4:28 PM  [facility] [address], [place], [address] Phone: [phone]   Fax:  [phone]  Name: MIKIAH DEMOND MRN: [id number] Date of Birth: 1941/03/13

## 2018-05-25 ENCOUNTER — Ambulatory Visit: Payer: Medicare Other

## 2018-05-25 DIAGNOSIS — R262 Difficulty in walking, not elsewhere classified: Secondary | ICD-10-CM

## 2018-05-25 DIAGNOSIS — M542 Cervicalgia: Secondary | ICD-10-CM

## 2018-05-25 DIAGNOSIS — G8929 Other chronic pain: Secondary | ICD-10-CM

## 2018-05-25 DIAGNOSIS — M5416 Radiculopathy, lumbar region: Secondary | ICD-10-CM

## 2018-05-25 DIAGNOSIS — M545 Low back pain: Secondary | ICD-10-CM | POA: Diagnosis not present

## 2018-05-25 DIAGNOSIS — M6281 Muscle weakness (generalized): Secondary | ICD-10-CM

## 2018-05-25 DIAGNOSIS — M5413 Radiculopathy, cervicothoracic region: Secondary | ICD-10-CM

## 2018-05-25 NOTE — Therapy (Signed)
[facility] [address] [place], [address] Phone: [phone]   Fax:  [phone]  Physical Therapy Treatment  Patient Details  [name] MRN: [id number] Date of Birth: 1941/01/18 Referring Provider (PT): Malen Gauze, MD   Encounter Date: 05/25/2018  PT End of Session - 05/25/18 1036    Visit Number  7    Number of Visits  17    Date for PT Re-Evaluation  06/24/18    Authorization Type  7    Authorization Time Period  of 10 progress report    PT Start Time  1036    PT Stop Time  1118    PT Time Calculation (min)  42 min    Activity Tolerance  Patient tolerated treatment well    Behavior During Therapy  [facility] for tasks assessed/performed       Past Medical History:  Diagnosis Date  . Appendicitis   . Atrial fibrillation ([place]) 12/12/2013  . BPH (benign prostatic hypertrophy)   . CAD (coronary artery disease) 12/2015   Cath by Dr [name] reveals distal and small vessel CAD.  Medical therapy advised.  . Chest pain 12/03/2015  . CHF (congestive heart failure) ([place])   . 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 Coronary artery disease   . Coronary artery disease involving native coronary artery of native heart with unstable angina pectoris ([place]) 08/11/2017  . History of blood transfusion 1968   "probably; related to getting wounded in [place]"  . History of kidney stones   . Hyperglycemia 11/05/2013  . Hyperlipidemia 11/05/2013  . Hypertension   . Hypothyroidism   . Hypothyroidism, unspecified 11/05/2013  . Inflammatory arthritis 11/05/2013  . Onychomycosis 12/20/2015  . OSA (obstructive sleep apnea) 10/26/2017    AHI of 8.1/h overall and 6.2/h during REM sleep.  AHI was 20/h while supine.  Oxygen saturations dropped to 87%.  Now on CPAP at 7cm H2O  . OSA on CPAP   . 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 Rheumatoid arthritis ([place])    "hands"  (08/11/2017)  . Type II diabetes mellitus ([place])     Past Surgical History:  Procedure Laterality Date  . BACK SURGERY    . CARDIAC CATHETERIZATION N/A 12/28/2015   Procedure: Left Heart Cath and Coronary Angiography;  Surgeon: Belva Crome, MD;  Location: [facility] CV LAB;  Service: Cardiovascular;  Laterality: N/A;  . CATARACT EXTRACTION W/ INTRAOCULAR LENS  IMPLANT, BILATERAL Bilateral   . CORONARY ANGIOPLASTY WITH STENT PLACEMENT  08/11/2017   "2 stents"  . CORONARY STENT INTERVENTION N/A 08/11/2017   Procedure: CORONARY STENT INTERVENTION;  Surgeon: Troy Sine, MD;  Location: [facility];  Service: Cardiovascular;  Laterality: N/A;  . CYSTOSCOPY W/ STONE MANIPULATION    . INGUINAL HERNIA REPAIR Left   . INSERT / REPLACE / REMOVE PACEMAKER  12/09/2011   SJM Accent DR RF implanted by DR Allred for complete heart block and syncope  . JOINT REPLACEMENT    . LAPAROSCOPIC CHOLECYSTECTOMY    . LITHOTRIPSY    . LUMBAR DISC SURGERY     "removed arthritis and spurs"  . PERMANENT PACEMAKER INSERTION N/A 12/09/2011   Procedure: PERMANENT PACEMAKER INSERTION;  Surgeon: [name], MD;  Location: [facility] CATH LAB;  Service: Cardiovascular;  Laterality: N/A;  . REPLACEMENT TOTAL KNEE Right   . RIGHT/LEFT HEART CATH AND CORONARY ANGIOGRAPHY  N/A 08/11/2017   Procedure: RIGHT/LEFT HEART CATH AND CORONARY ANGIOGRAPHY;  Surgeon: Larey Dresser, MD;  Location: [facility];  Service: Cardiovascular;  Laterality: N/A;  . TRANSURETHRAL RESECTION OF PROSTATE  2017/2018    There were no vitals filed for this visit.  Subjective Assessment - 05/25/18 1037    Subjective  Back and neck seems to be better. R arm is better. L arm strength is better, still numb. No inner thigh symptom. Feet still feel a little numb but not as bad.  No pain currently.     Pertinent History  Myelopathy. Symptoms began about 3.5 months ago starting with his L hand and fingers and radiated up his L arm (along the  ulnar and C8/T1 dermatome).  Pain stopped at his L shoulder.  Pt also states having pain across his shoulders,  Pt then had similar symptoms at same areas in his R UE. Pain then went down his B inner thighs L greater than the R down to his great toe.  Pt also states having low back pain.  Neck bothers pt more than low back.  Has a little bit of bladder incontinence since last year and does not know if Dr. Lacinda Axon knows about it. Has a little bit of a drip when he stands up at times.  Has bowel control.  Has not had any imaging for his low back in the last few years.   Pt is R hand dominant.  Denies numbness at S3-5 dermatomes.   Pt states he thinks Dr. Lacinda Axon knows about his B inner thigh numbness.    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       Patient Stated Goals  Be more stable and walk better.     Currently in Pain?  No/denies    Pain Score  0-No pain    Pain Onset  More than a month ago                               PT Education - 05/25/18 1040    Education Details  ther-ex    [employer]) Educated  Patient    Methods  Explanation;Demonstration;Tactile cues;Verbal cues    Comprehension  Returned demonstration;Verbalized understanding      Objectives   Does not know if he has a fusion in his back  Pacemaker  no latex allergies   MedbridgeAccess Code: [email or web address]  Gait: R pelvic drop during L LE stance phase, increased medial knee pressure during R LE stance phase afterwards.      Therapeutic exercise   Standing ankle DF/PF on rocker board with B UE assist 2 minutes  Sitting with upright posture              Manual trunk perturbation from PT 1 minute x 3              Manually resisted trunk extension 10x5 seconds for 2 sets  Standing hip abduction with B UE assist 2 lbs 10x3 each LE  Standing diagonal hip extension with B UE assist 2 lbs 10x2 each  LE  Standing LE leg press resisting double blue band with B UE assist 10x3 each LE  Standing B shoulder extension yellow band 10x5 seconds for 3 sets  Standing R lateral shift correction 10x5 seconds for 2 sets  SLS with  B UE assist, and emphasis on level pelvis to promote glute med muscle strength L LE 10x5 seconds, then 5x5 seconds. L knee discomfort.  R LE 10x5 seconds then 5x5 seconds   Seated B scapular retraction red band 10x5 seconds  Improved exercise technique, movement at target joints, use of target muscles after mod verbal, visual, tactile cues.    Pt seems to continue doing well with his back, neck, UE and LE symptoms. Continued working on trunk, and hip strengthening to help continue progress. Demonstrates L glute med weakness/fatigue with closed chain L glute med strengthening compared to R glute med. Pt will benefit from continued skilled physical therapy services to decrease pain, improve strength and function.           PT Short Term Goals - 04/26/18 1716      PT SHORT TERM GOAL #1   Title  Patient will be independent with his HEP to decrease pain, improve strength and function.     Time  3    Period  Weeks    Status  New    Target Date  05/20/18        PT Long Term Goals - 04/26/18 1716      PT LONG TERM GOAL #1   Title  Patient will have a decrease in neck pain to 4/10 or less at worst to promote ability to perform sitting tasks.    Baseline  9/10 neck pain at worst for the past month (04/26/2018)    Time  8    Period  Weeks    Status  New    Target Date  06/24/18      PT LONG TERM GOAL #2   Title  Patient will have a decrease in B UE paresthesia to 4/10 or less at worst to promote ability to perform functional tasks wiht his UE.     Baseline  Paresthesia: L UE 9/10, R UE 8/10 at worst for the past month (04/26/2018)    Time  8    Period  Weeks    Status  New    Target Date  06/24/18      PT LONG TERM GOAL #3    Title  Patient will have a decrease in back pain to 4/10 or less at worst to promote ability to ambulate, perform standing tasks.     Baseline  8/10 at worst for the past month (04/26/2018)    Time  8    Period  Weeks    Status  New    Target Date  06/24/18      PT LONG TERM GOAL #4   Title  Patient will have a decrease in bilateral LE paresthesia to to 3/10 or less at worst to promote ability to perform standing tasks as well as ambulate.     Baseline  Paresthesia: L LE 8/10, R LE 6/10 at worst for the past month (04/26/2018)    Time  8    Period  Weeks    Status  New    Target Date  06/24/18      PT LONG TERM GOAL #5   Title  Patient will improve TUG time to 12 seconds or less to promote functional mobility     Baseline  13.2 seconds average without AD (04/26/2018)    Time  8    Period  Weeks    Status  New    Target Date  06/24/18      Additional Long Term  Goals   Additional Long Term Goals  Yes      PT LONG TERM GOAL #6   Title  Patient will improve his lower leg FOTO score by at least 10 points as a demonstration of improved function.     Baseline  30 (04/26/2018)    Time  8    Period  Weeks    Status  New    Target Date  06/24/18            Plan - 05/25/18 1040    Clinical Impression Statement  Pt seems to continue doing well with his back, neck, UE and LE symptoms. Continued working on trunk, and hip strengthening to help continue progress. Demonstrates L glute med weakness/fatigue with closed chain L glute med strengthening compared to R glute med. Pt will benefit from continued skilled physical therapy services to decrease pain, improve strength and function.     Rehab Potential  Fair    Clinical Impairments Affecting Rehab Potential  (-) medical history, weakness, B UE and LE paresthesia, age, neck and back pain. (+) decreased bilateral LE paresthesia during evaluation, motivated.     PT Frequency  2x / week    PT Duration  8 weeks    PT Treatment/Interventions   Aquatic Therapy;Traction;Gait training;Therapeutic activities;Therapeutic exercise;Balance training;Neuromuscular re-education;Patient/family education;Manual techniques;Dry needling   traction if appropriate   PT Next Visit Plan  posture, gentle lateral shift correction, gentle back extension, scapular strengthening, thoracic extension, glute and trunk strengthening     Consulted and Agree with Plan of Care  Patient       Patient will benefit from skilled therapeutic intervention in order to improve the following deficits and impairments:  Pain, Improper body mechanics, Postural dysfunction, Decreased strength, Difficulty walking, Decreased balance, Abnormal gait  Visit Diagnosis: Chronic bilateral low back pain, unspecified whether sciatica present  Radiculopathy, lumbar region  Muscle weakness (generalized)  Difficulty in walking, not elsewhere classified  Cervicalgia  Radiculopathy, cervicothoracic region     Problem List Patient Active Problem List   Diagnosis Date Noted  . OSA (obstructive sleep apnea) 10/26/2017  . Coronary artery disease involving native coronary artery of native heart with unstable angina pectoris ([place]) 08/11/2017  . Coronary artery disease   . 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 D-dimer, elevated 04/03/2017  . S/P TURP 01/05/2017  . Right lower quadrant abdominal pain   . Acute kidney injury ([place]) 09/09/2016  . Appendicitis   . Onychomycosis 12/20/2015  . Chest pain 12/03/2015  . Atrial fibrillation ([place]) 12/12/2013  . Arthritis 11/05/2013  . Hyperglycemia 11/05/2013  . Hyperglycemia, unspecified 11/05/2013  . Hyperlipidemia 11/05/2013  . Hyperlipidemia, unspecified 11/05/2013  . Hypothyroidism 11/05/2013  . Hypothyroidism, unspecified 11/05/2013  . Inflammatory arthritis 11/05/2013  . OA (osteoarthritis) 11/05/2013  . Osteoarthritis 11/05/2013  . Male erectile dysfunction 06/18/2012  . Urge incontinence 06/18/2012  . Pacemaker-St.Jude  03/10/2012  . Complete heart block ([place]) 12/09/2011  . Hypertension 12/09/2011  . Rheumatoid arthritis(714.0) 12/09/2011  . Rheumatoid arthritis ([place]) 12/09/2011    Joneen Boers PT, DPT   05/25/2018, 12:39 PM  [facility] [address] [place], [address] Phone: [phone]   Fax:  [phone]  [name] MRN: [id number] Date of Birth: 1940/09/21

## 2018-06-01 ENCOUNTER — Ambulatory Visit: Payer: Medicare Other | Attending: Neurosurgery

## 2018-06-01 DIAGNOSIS — R262 Difficulty in walking, not elsewhere classified: Secondary | ICD-10-CM | POA: Diagnosis present

## 2018-06-01 DIAGNOSIS — M545 Low back pain: Secondary | ICD-10-CM | POA: Diagnosis present

## 2018-06-01 DIAGNOSIS — M5413 Radiculopathy, cervicothoracic region: Secondary | ICD-10-CM

## 2018-06-01 DIAGNOSIS — M542 Cervicalgia: Secondary | ICD-10-CM

## 2018-06-01 DIAGNOSIS — M5416 Radiculopathy, lumbar region: Secondary | ICD-10-CM

## 2018-06-01 DIAGNOSIS — G8929 Other chronic pain: Secondary | ICD-10-CM | POA: Diagnosis present

## 2018-06-01 DIAGNOSIS — M6281 Muscle weakness (generalized): Secondary | ICD-10-CM | POA: Diagnosis present
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
## 2018-06-03 ENCOUNTER — Ambulatory Visit: Payer: Medicare Other

## 2018-06-03 DIAGNOSIS — M5416 Radiculopathy, lumbar region: Secondary | ICD-10-CM

## 2018-06-03 DIAGNOSIS — M5413 Radiculopathy, cervicothoracic region: Secondary | ICD-10-CM

## 2018-06-03 DIAGNOSIS — R262 Difficulty in walking, not elsewhere classified: Secondary | ICD-10-CM

## 2018-06-03 DIAGNOSIS — M6281 Muscle weakness (generalized): Secondary | ICD-10-CM

## 2018-06-03 DIAGNOSIS — M545 Low back pain: Secondary | ICD-10-CM | POA: Diagnosis not present

## 2018-06-03 DIAGNOSIS — G8929 Other chronic pain: Secondary | ICD-10-CM

## 2018-06-03 DIAGNOSIS — M542 Cervicalgia: Secondary | ICD-10-CM

## 2018-06-03 NOTE — Therapy (Signed)
[facility] [address], [place], [address] Phone: [phone]   Fax:  [phone]  Physical Therapy Treatment  Patient Details  [name] MRN: [id number] Date of Birth: Sep 08, 1940 Referring Provider (PT): Malen Gauze, MD   Encounter Date: 06/03/2018  PT End of Session - 06/03/18 0947    Visit Number  9    Number of Visits  17    Date for PT Re-Evaluation  06/24/18    Authorization Type  1    Authorization Time Period  of 10 progress report    PT Start Time  0948    PT Stop Time  1032    PT Time Calculation (min)  44 min    Activity Tolerance  Patient tolerated treatment well    Behavior During Therapy  [facility] for tasks assessed/performed       Past Medical History:  Diagnosis Date  . Appendicitis   . Atrial fibrillation ([place]) 12/12/2013  . BPH (benign prostatic hypertrophy)   . CAD (coronary artery disease) 12/2015   Cath by Dr [name] reveals distal and small vessel CAD.  Medical therapy advised.  . Chest pain 12/03/2015  . CHF (congestive heart failure) ([place])   . 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 Coronary artery disease   . Coronary artery disease involving native coronary artery of native heart with unstable angina pectoris ([place]) 08/11/2017  . History of blood transfusion 1968   "probably; related to getting wounded in [place]"  . History of kidney stones   . Hyperglycemia 11/05/2013  . Hyperlipidemia 11/05/2013  . Hypertension   . Hypothyroidism   . Hypothyroidism, unspecified 11/05/2013  . Inflammatory arthritis 11/05/2013  . Onychomycosis 12/20/2015  . OSA (obstructive sleep apnea) 10/26/2017    AHI of 8.1/h overall and 6.2/h during REM sleep.  AHI was 20/h while supine.  Oxygen saturations dropped to 87%.  Now on CPAP at 7cm H2O  . OSA on CPAP   . 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 Rheumatoid arthritis ([place])    "hands"  (08/11/2017)  . Type II diabetes mellitus ([place])     Past Surgical History:  Procedure Laterality Date  . BACK SURGERY    . CARDIAC CATHETERIZATION N/A 12/28/2015   Procedure: Left Heart Cath and Coronary Angiography;  Surgeon: Belva Crome, MD;  Location: [facility];  Service: Cardiovascular;  Laterality: N/A;  . CATARACT EXTRACTION W/ INTRAOCULAR LENS  IMPLANT, BILATERAL Bilateral   . CORONARY ANGIOPLASTY WITH STENT PLACEMENT  08/11/2017   "2 stents"  . CORONARY STENT INTERVENTION N/A 08/11/2017   Procedure: CORONARY STENT INTERVENTION;  Surgeon: Troy Sine, MD;  Location: [facility];  Service: Cardiovascular;  Laterality: N/A;  . CYSTOSCOPY W/ STONE MANIPULATION    . INGUINAL HERNIA REPAIR Left   . INSERT / REPLACE / REMOVE PACEMAKER  12/09/2011   SJM Accent DR RF implanted by DR Allred for complete heart block and syncope  . JOINT REPLACEMENT    . LAPAROSCOPIC CHOLECYSTECTOMY    . LITHOTRIPSY    . LUMBAR DISC SURGERY     "removed arthritis and spurs"  . PERMANENT PACEMAKER INSERTION N/A 12/09/2011   Procedure: PERMANENT PACEMAKER INSERTION;  Surgeon: [name], MD;  Location: [facility];  Service: Cardiovascular;  Laterality: N/A;  . REPLACEMENT TOTAL KNEE Right   . RIGHT/LEFT HEART CATH AND CORONARY ANGIOGRAPHY  N/A 08/11/2017   Procedure: RIGHT/LEFT HEART CATH AND CORONARY ANGIOGRAPHY;  Surgeon: Larey Dresser, MD;  Location: [facility];  Service: Cardiovascular;  Laterality: N/A;  . TRANSURETHRAL RESECTION OF PROSTATE  2017/2018    There were no vitals filed for this visit.  Subjective Assessment - 06/03/18 0949    Subjective  Going to [place] 06/19/2018 to 06/25/2018. Everything is better. The R hand and L hand flexibility is better. Still has the numbness in L hand but not as week. The R hand is almost not a problem.  2/10 neck pain currently. Almost not there.     Pertinent History  Myelopathy. Symptoms began about 3.5 months ago starting with his  L hand and fingers and radiated up his L arm (along the ulnar and C8/T1 dermatome).  Pain stopped at his L shoulder.  Pt also states having pain across his shoulders,  Pt then had similar symptoms at same areas in his R UE. Pain then went down his B inner thighs L greater than the R down to his great toe.  Pt also states having low back pain.  Neck bothers pt more than low back.  Has a little bit of bladder incontinence since last year and does not know if Dr. Lacinda Axon knows about it. Has a little bit of a drip when he stands up at times.  Has bowel control.  Has not had any imaging for his low back in the last few years.   Pt is R hand dominant.  Denies numbness at S3-5 dermatomes.   Pt states he thinks Dr. Lacinda Axon knows about his B inner thigh numbness.    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       Patient Stated Goals  Be more stable and walk better.     Currently in Pain?  Yes    Pain Score  2     Pain Location  Neck    Pain Onset  More than a month ago                               PT Education - 06/03/18 1804    Education Details  ther-ex    Person(s) Educated  Patient    Methods  Explanation;Demonstration;Tactile cues;Verbal cues    Comprehension  Returned demonstration;Verbalized understanding      Objectives   Does not know if he has a fusion in his back  Pacemaker  no latex allergies    MedbridgeAccess Code: [email or web address]  Gait: R pelvic drop during L LE stance phase, increased medial knee pressure during R LE stance phase afterwards.  Going to [place] 06/19/2018 to 06/25/2018.   Manual therapy  Seated STM R cervical paraspinal muscles  R rhomboid  Decreased neck pain and finger numbness  Seated STM L infraspinatus and proximal triceps  Decreased L hand paresthesia    Therapeutic exercise   Seated manually resisted scapular retraction isometrics  targeting the lower trap muscles to help decrease upper trap tension  L 10x5 seconds for 3 sets  R 10x5 seconds for 3 sets  Sitting with Lumbar towel roll for 3 sets to help continue progress with decreased back and LE pain.   Seated manually resisted L scapular depression isometrics 10x5 seconds for 3 sets to help decrease L scalene muscle tension   No  change in L hand symptoms  Improved exercise technique, movement at target joints, use of target muscles after min verbal, visual, tactile cues.   Pt continues to ambulate with improved steadiness observed. Decreased L hand paresthesia with treatment to decrease tension to neck, rhomboid and L infraspinatus muscles, decreasing pressure to L UE nerves. Patient will benefit from continued skilled physical therapy services to continue to improve neck and back pain as well as B UE and B LE symptoms, improve strength, function, and ability to ambulate more steadily.             PT Short Term Goals - 06/01/18 1048      PT SHORT TERM GOAL #1   Title  Patient will be independent with his HEP to decrease pain, improve strength and function.     Time  3    Period  Weeks    Status  On-going    Target Date  05/20/18        PT Long Term Goals - 06/01/18 0141      PT LONG TERM GOAL #1   Title  Patient will have a decrease in neck pain to 4/10 or less at worst to promote ability to perform sitting tasks.    Baseline  9/10 neck pain at worst for the past month (04/26/2018); 5/10 at most for the past 7 days (06/01/2018)    Time  8    Period  Weeks    Status  Partially Met    Target Date  06/24/18      PT LONG TERM GOAL #2   Title  Patient will have a decrease in B UE paresthesia to 4/10 or less at worst to promote ability to perform functional tasks wiht his UE.     Baseline  Paresthesia: L UE 9/10, R UE 8/10 at worst for the past month (04/26/2018); 7/10 L UE, 4/10 R UE at most for the past 7 days (06/01/2018)    Time  8    Period  Weeks     Status  Partially Met    Target Date  06/24/18      PT LONG TERM GOAL #3   Title  Patient will have a decrease in back pain to 4/10 or less at worst to promote ability to ambulate, perform standing tasks.     Baseline  8/10 at worst for the past month (04/26/2018); 4/10 at most for the past 7 days (06/01/2018)    Time  8    Period  Weeks    Status  Achieved    Target Date  06/24/18      PT LONG TERM GOAL #4   Title  Patient will have a decrease in bilateral LE paresthesia to to 3/10 or less at worst to promote ability to perform standing tasks as well as ambulate.     Baseline  Paresthesia: L LE 8/10, R LE 6/10 at worst for the past month (04/26/2018); 4/10 L LE, 3/10 R LE at most for the past 7 days (06/01/2017)    Time  8    Period  Weeks    Status  Partially Met    Target Date  06/24/18      PT LONG TERM GOAL #5   Title  Patient will improve TUG time to 12 seconds or less to promote functional mobility     Baseline  13.2 seconds average without AD (04/26/2018); 9.8 seconds average without AD (06/01/2018)    Time  8  Period  Weeks    Status  Achieved    Target Date  06/24/18      PT LONG TERM GOAL #6   Title  Patient will improve his lower leg FOTO score by at least 10 points as a demonstration of improved function.     Baseline  30 (04/26/2018); 45 (06/01/2017)    Time  8    Period  Weeks    Status  Achieved    Target Date  06/24/18            Plan - 06/03/18 1804    Clinical Impression Statement  Pt continues to ambulate with improved steadiness observed. Decreased L hand paresthesia with treatment to decrease tension to neck, rhomboid and L infraspinatus muscles, decreasing pressure to L UE nerves. Patient will benefit from continued skilled physical therapy services to continue to improve neck and back pain as well as B UE and B LE symptoms, improve strength, function, and ability to ambulate more steadily.     Rehab Potential  Fair    Clinical Impairments Affecting Rehab  Potential  (-) medical history, weakness, B UE and LE paresthesia, age, neck and back pain. (+) decreased bilateral LE paresthesia during evaluation, motivated.     PT Frequency  2x / week    PT Duration  8 weeks    PT Treatment/Interventions  Aquatic Therapy;Traction;Gait training;Therapeutic activities;Therapeutic exercise;Balance training;Neuromuscular re-education;Patient/family education;Manual techniques;Dry needling   traction if appropriate   PT Next Visit Plan  posture, gentle lateral shift correction, gentle back extension, scapular strengthening, thoracic extension, glute and trunk strengthening     Consulted and Agree with Plan of Care  Patient       Patient will benefit from skilled therapeutic intervention in order to improve the following deficits and impairments:  Pain, Improper body mechanics, Postural dysfunction, Decreased strength, Difficulty walking, Decreased balance, Abnormal gait  Visit Diagnosis: Chronic bilateral low back pain, unspecified whether sciatica present  Radiculopathy, lumbar region  Muscle weakness (generalized)  Difficulty in walking, not elsewhere classified  Cervicalgia  Radiculopathy, cervicothoracic region     Problem List Patient Active Problem List   Diagnosis Date Noted  . OSA (obstructive sleep apnea) 10/26/2017  . Coronary artery disease involving native coronary artery of native heart with unstable angina pectoris ([place]) 08/11/2017  . Coronary artery disease   . 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 D-dimer, elevated 04/03/2017  . S/P TURP 01/05/2017  . Right lower quadrant abdominal pain   . Acute kidney injury ([place]) 09/09/2016  . Appendicitis   . Onychomycosis 12/20/2015  . Chest pain 12/03/2015  . Atrial fibrillation ([place]) 12/12/2013  . Arthritis 11/05/2013  . Hyperglycemia 11/05/2013  . Hyperglycemia, unspecified 11/05/2013  . Hyperlipidemia 11/05/2013  . Hyperlipidemia, unspecified 11/05/2013  . Hypothyroidism 11/05/2013   . Hypothyroidism, unspecified 11/05/2013  . Inflammatory arthritis 11/05/2013  . OA (osteoarthritis) 11/05/2013  . Osteoarthritis 11/05/2013  . Male erectile dysfunction 06/18/2012  . Urge incontinence 06/18/2012  . 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 Complete heart block ([place]) 12/09/2011  . Hypertension 12/09/2011  . Rheumatoid arthritis(714.0) 12/09/2011  . Rheumatoid arthritis ([place]) 12/09/2011    Joneen Boers PT, DPT   06/03/2018, 6:17 PM  [facility] [address], [place], [address] Phone: [phone]   Fax:  [phone]  [name] MRN: [id number] Date of Birth: 1940/10/06

## 2018-06-08 ENCOUNTER — Ambulatory Visit: Payer: Medicare Other

## 2018-06-08 DIAGNOSIS — M545 Low back pain, unspecified: Secondary | ICD-10-CM

## 2018-06-08 DIAGNOSIS — M5416 Radiculopathy, lumbar region: Secondary | ICD-10-CM

## 2018-06-08 DIAGNOSIS — G8929 Other chronic pain: Secondary | ICD-10-CM

## 2018-06-08 DIAGNOSIS — M5413 Radiculopathy, cervicothoracic region: Secondary | ICD-10-CM

## 2018-06-08 DIAGNOSIS — M6281 Muscle weakness (generalized): Secondary | ICD-10-CM

## 2018-06-08 DIAGNOSIS — M542 Cervicalgia: Secondary | ICD-10-CM

## 2018-06-08 DIAGNOSIS — R262 Difficulty in walking, not elsewhere classified: Secondary | ICD-10-CM

## 2018-06-08 NOTE — Therapy (Signed)
[facility] [address], [place], [address] Phone: [phone]   Fax:  [phone]  Physical Therapy Treatment  Patient Details  [name] MRN: [id number] Date of Birth: 06-18-1940 Referring Provider (PT): Malen Gauze, MD   Encounter Date: 06/08/2018  PT End of Session - 06/08/18 0946    Visit Number  10    Number of Visits  17    Date for PT Re-Evaluation  06/24/18    Authorization Type  2    Authorization Time Period  of 10 progress report    PT Start Time  0946    PT Stop Time  1032    PT Time Calculation (min)  46 min    Activity Tolerance  Patient tolerated treatment well    Behavior During Therapy  [facility] for tasks assessed/performed       Past Medical History:  Diagnosis Date  . Appendicitis   . Atrial fibrillation ([place]) 12/12/2013  . BPH (benign prostatic hypertrophy)   . CAD (coronary artery disease) 12/2015   Cath by Dr [name] reveals distal and small vessel CAD.  Medical therapy advised.  . Chest pain 12/03/2015  . CHF (congestive heart failure) ([place])   . 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 Coronary artery disease   . Coronary artery disease involving native coronary artery of native heart with unstable angina pectoris ([place]) 08/11/2017  . History of blood transfusion 1968   "probably; related to getting wounded in [place]"  . History of kidney stones   . Hyperglycemia 11/05/2013  . Hyperlipidemia 11/05/2013  . Hypertension   . Hypothyroidism   . Hypothyroidism, unspecified 11/05/2013  . Inflammatory arthritis 11/05/2013  . Onychomycosis 12/20/2015  . OSA (obstructive sleep apnea) 10/26/2017    AHI of 8.1/h overall and 6.2/h during REM sleep.  AHI was 20/h while supine.  Oxygen saturations dropped to 87%.  Now on CPAP at 7cm H2O  . OSA on CPAP   . 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 Rheumatoid arthritis ([place])    "hands"  (08/11/2017)  . Type II diabetes mellitus ([place])     Past Surgical History:  Procedure Laterality Date  . BACK SURGERY    . CARDIAC CATHETERIZATION N/A 12/28/2015   Procedure: Left Heart Cath and Coronary Angiography;  Surgeon: Belva Crome, MD;  Location: [facility];  Service: Cardiovascular;  Laterality: N/A;  . CATARACT EXTRACTION W/ INTRAOCULAR LENS  IMPLANT, BILATERAL Bilateral   . CORONARY ANGIOPLASTY WITH STENT PLACEMENT  08/11/2017   "2 stents"  . CORONARY STENT INTERVENTION N/A 08/11/2017   Procedure: CORONARY STENT INTERVENTION;  Surgeon: Troy Sine, MD;  Location: [facility];  Service: Cardiovascular;  Laterality: N/A;  . CYSTOSCOPY W/ STONE MANIPULATION    . INGUINAL HERNIA REPAIR Left   . INSERT / REPLACE / REMOVE PACEMAKER  12/09/2011   SJM Accent DR RF implanted by DR Allred for complete heart block and syncope  . JOINT REPLACEMENT    . LAPAROSCOPIC CHOLECYSTECTOMY    . LITHOTRIPSY    . LUMBAR DISC SURGERY     "removed arthritis and spurs"  . PERMANENT PACEMAKER INSERTION N/A 12/09/2011   Procedure: PERMANENT PACEMAKER INSERTION;  Surgeon: [name], MD;  Location: [facility];  Service: Cardiovascular;  Laterality: N/A;  . REPLACEMENT TOTAL KNEE Right   . RIGHT/LEFT HEART CATH AND CORONARY ANGIOGRAPHY  N/A 08/11/2017   Procedure: RIGHT/LEFT HEART CATH AND CORONARY ANGIOGRAPHY;  Surgeon: Larey Dresser, MD;  Location: [facility];  Service: Cardiovascular;  Laterality: N/A;  . TRANSURETHRAL RESECTION OF PROSTATE  2017/2018    There were no vitals filed for this visit.  Subjective Assessment - 06/08/18 0948    Subjective  Pt reported very minimal pain while sitting at rest. Does report numbness today, L >R, though R hand is significantly better.    Pertinent History  Myelopathy. Symptoms began about 3.5 months ago starting with his L hand and fingers and radiated up his L arm (along the ulnar and C8/T1 dermatome).  Pain stopped at his L  shoulder.  Pt also states having pain across his shoulders,  Pt then had similar symptoms at same areas in his R UE. Pain then went down his B inner thighs L greater than the R down to his great toe.  Pt also states having low back pain.  Neck bothers pt more than low back.  Has a little bit of bladder incontinence since last year and does not know if Dr. Lacinda Axon knows about it. Has a little bit of a drip when he stands up at times.  Has bowel control.  Has not had any imaging for his low back in the last few years.   Pt is R hand dominant.  Denies numbness at S3-5 dermatomes.   Pt states he thinks Dr. Lacinda Axon knows about his B inner thigh numbness.    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       Patient Stated Goals  Be more stable and walk better.     Currently in Pain?  Yes    Pain Score  2     Pain Location  Neck    Pain Orientation  Lower    Pain Onset  More than a month ago      Objective: Does not know if he has a fusion in his back   Pacemaker    no latex allergies       Medbridge Access Code: [email or web address]    Gait: R pelvic drop during L LE stance phase, mild improvement in trendelenburg gait at end of session.   Going to [place] 06/19/2018 to 06/25/2018.    Manual therapy   Seated STM R cervical paraspinal muscles             R rhomboid             Decreased neck pain and finger numbness   Seated STM L infraspinatus and proximal triceps             Decreased L hand paresthesia    Therapeutic exercise Seated manually resisted scapular retraction isometrics targeting the lower trap muscles to help decrease upper trap tension             L 10x3 seconds for 3 sets             R 10x3 seconds for 3 sets  bilateral 10x3sec holds for 3 sets   Seated tricep extension isometrics 3 sets of 10 with 3 sec holds   Sitting with Lumbar towel roll for to help continue progress with decreased back and LE pain.     Seated manually resisted L scapular depression isometrics 10x5 seconds for 3 sets to help decrease L scalene muscle tension  No change in L hand symptoms  Seated hamstring curls with BTB and PT assist, focus on eccentric control 2x10 bilaterally  Seated ankle DF with BTB resistance with PT assist, focus on eccentric control 2x10 bilaterally    Improved exercise technique, movement at target joints, use of target muscles after min verbal, tactile and visual cues.      PT Education - 06/08/18 0949    Education Details  ther ex technique/form, condition    Person(s) Educated  Patient    Methods  Explanation;Demonstration;Tactile cues;Verbal cues       PT Short Term Goals - 06/01/18 1048      PT SHORT TERM GOAL #1   Title  Patient will be independent with his HEP to decrease pain, improve strength and function.     Time  3    Period  Weeks    Status  On-going    Target Date  05/20/18        PT Long Term Goals - 06/01/18 5188      PT LONG TERM GOAL #1   Title  Patient will have a decrease in neck pain to 4/10 or less at worst to promote ability to perform sitting tasks.    Baseline  9/10 neck pain at worst for the past month (04/26/2018); 5/10 at most for the past 7 days (06/01/2018)    Time  8    Period  Weeks    Status  Partially Met    Target Date  06/24/18      PT LONG TERM GOAL #2   Title  Patient will have a decrease in B UE paresthesia to 4/10 or less at worst to promote ability to perform functional tasks wiht his UE.     Baseline  Paresthesia: L UE 9/10, R UE 8/10 at worst for the past month (04/26/2018); 7/10 L UE, 4/10 R UE at most for the past 7 days (06/01/2018)    Time  8    Period  Weeks    Status  Partially Met    Target Date  06/24/18      PT LONG TERM GOAL #3   Title  Patient will have a decrease in back pain to 4/10 or less at worst to promote ability to ambulate, perform standing tasks.     Baseline  8/10 at worst for the past month  (04/26/2018); 4/10 at most for the past 7 days (06/01/2018)    Time  8    Period  Weeks    Status  Achieved    Target Date  06/24/18      PT LONG TERM GOAL #4   Title  Patient will have a decrease in bilateral LE paresthesia to to 3/10 or less at worst to promote ability to perform standing tasks as well as ambulate.     Baseline  Paresthesia: L LE 8/10, R LE 6/10 at worst for the past month (04/26/2018); 4/10 L LE, 3/10 R LE at most for the past 7 days (06/01/2017)    Time  8    Period  Weeks    Status  Partially Met    Target Date  06/24/18      PT LONG TERM GOAL #5   Title  Patient will improve TUG time to 12 seconds or less to promote functional mobility     Baseline  13.2 seconds average without AD (04/26/2018); 9.8 seconds average without AD (06/01/2018)    Time  8    Period  Weeks    Status  Achieved    Target Date  06/24/18      PT LONG TERM GOAL #6   Title  Patient will improve his lower leg FOTO score by at least 10 points as a demonstration of improved function.     Baseline  30 (04/26/2018); 45 (06/01/2017)    Time  8    Period  Weeks    Status  Achieved    Target Date  06/24/18            Plan - 06/08/18 1034    Clinical Impression Statement  Patient demonstrated and reported improved gait steadiness post session. Challenged by eccentric control of LLE exhibited shakiness due to fatigue at end of reps. No pain reported throughout session, mild decrease in L hand parathesia per patient, as well as decreased muscle tension palpation. The patient would benefit from continued PT to maximize strength, ambulation, and safety.    Rehab Potential  Fair    Clinical Impairments Affecting Rehab Potential  (-) medical history, weakness, B UE and LE paresthesia, age, neck and back pain. (+) decreased bilateral LE paresthesia during evaluation, motivated.     PT Frequency  2x / week    PT Duration  8 weeks    PT Treatment/Interventions  Aquatic Therapy;Traction;Gait training;Therapeutic  activities;Therapeutic exercise;Balance training;Neuromuscular re-education;Patient/family education;Manual techniques;Dry needling   traction if appropriate   PT Next Visit Plan  posture, gentle lateral shift correction, gentle back extension, scapular strengthening, thoracic extension, glute and trunk strengthening     Consulted and Agree with Plan of Care  Patient       Patient will benefit from skilled therapeutic intervention in order to improve the following deficits and impairments:  Pain, Improper body mechanics, Postural dysfunction, Decreased strength, Difficulty walking, Decreased balance, Abnormal gait  Visit Diagnosis: Chronic bilateral low back pain, unspecified whether sciatica present  Radiculopathy, lumbar region  Muscle weakness (generalized)  Difficulty in walking, not elsewhere classified  Cervicalgia  Radiculopathy, cervicothoracic region     Problem List Patient Active Problem List   Diagnosis Date Noted  . OSA (obstructive sleep apnea) 10/26/2017  . Coronary artery disease involving native coronary artery of native heart with unstable angina pectoris ([place]) 08/11/2017  . Coronary artery disease   . 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 D-dimer, elevated 04/03/2017  . S/P TURP 01/05/2017  . Right lower quadrant abdominal pain   . Acute kidney injury ([place]) 09/09/2016  . Appendicitis   . Onychomycosis 12/20/2015  . Chest pain 12/03/2015  . Atrial fibrillation ([place]) 12/12/2013  . Arthritis 11/05/2013  . Hyperglycemia 11/05/2013  . Hyperglycemia, unspecified 11/05/2013  . Hyperlipidemia 11/05/2013  . Hyperlipidemia, unspecified 11/05/2013  . Hypothyroidism 11/05/2013  . Hypothyroidism, unspecified 11/05/2013  . Inflammatory arthritis 11/05/2013  . OA (osteoarthritis) 11/05/2013  . Osteoarthritis 11/05/2013  . Male erectile dysfunction 06/18/2012  . Urge incontinence 06/18/2012  . 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 Complete heart block ([place]) 12/09/2011   . Hypertension 12/09/2011  . Rheumatoid arthritis(714.0) 12/09/2011  . Rheumatoid arthritis ([place]) 12/09/2011    Lieutenant Diego PT, DPT 10:41 AM,06/08/18 [facility] [address] [place], [address] Phone: [phone]   Fax:  [phone]  Name: KAMARIUS BUCKBEE MRN: [id number] Date of Birth: Jan 29, 1941

## 2018-06-10 ENCOUNTER — Ambulatory Visit: Payer: Medicare Other

## 2018-06-10 DIAGNOSIS — M545 Low back pain, unspecified: Secondary | ICD-10-CM

## 2018-06-10 DIAGNOSIS — M6281 Muscle weakness (generalized): Secondary | ICD-10-CM

## 2018-06-10 DIAGNOSIS — R262 Difficulty in walking, not elsewhere classified: Secondary | ICD-10-CM

## 2018-06-10 DIAGNOSIS — G8929 Other chronic pain: Secondary | ICD-10-CM

## 2018-06-10 DIAGNOSIS — M5416 Radiculopathy, lumbar region: Secondary | ICD-10-CM

## 2018-06-10 DIAGNOSIS — M5413 Radiculopathy, cervicothoracic region: Secondary | ICD-10-CM

## 2018-06-10 DIAGNOSIS — M542 Cervicalgia: Secondary | ICD-10-CM

## 2018-06-10 NOTE — Patient Instructions (Addendum)
   MedbridgeAccess Code: [email or web address] Standing Hip Abduction with Resistance at Ankles and Counter Support   2-3 x 10 each LE yellow band   Seated Scapular Retraction  2x10 with 10 second holds
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
## 2018-06-12 LAB — CUP PACEART REMOTE DEVICE CHECK
Battery Remaining Longevity: 47 mo
Battery Remaining Percentage: 40 %
Battery Voltage: 2.84 V
Brady Statistic AP VP Percent: 95 %
Brady Statistic AP VS Percent: 1 %
Brady Statistic AS VP Percent: 4.9 %
Brady Statistic AS VS Percent: 1 %
Brady Statistic RA Percent Paced: 95 %
Brady Statistic RV Percent Paced: 99 %
Date Time Interrogation Session: 20191218085409
Implantable Lead Implant Date: 20130716
Implantable Lead Implant Date: 20130716
Implantable Lead Location: 753859
Implantable Lead Location: 753860
Implantable Lead Model: 1948
Implantable Pulse Generator Implant Date: 20130716
Lead Channel Impedance Value: 330 Ohm
Lead Channel Impedance Value: 560 Ohm
Lead Channel Pacing Threshold Amplitude: 0.5 V
Lead Channel Pacing Threshold Amplitude: 0.5 V
Lead Channel Pacing Threshold Pulse Width: 0.5 ms
Lead Channel Pacing Threshold Pulse Width: 0.5 ms
Lead Channel Sensing Intrinsic Amplitude: 12 mV
Lead Channel Sensing Intrinsic Amplitude: 2.2 mV
Lead Channel Setting Pacing Amplitude: 0.75 V
Lead Channel Setting Pacing Amplitude: 1.5 V
Lead Channel Setting Pacing Pulse Width: 0.5 ms
Lead Channel Setting Sensing Sensitivity: 4 mV
Pulse Gen Model: 2210
Pulse Gen Serial Number: 7356500

## 2018-06-15 ENCOUNTER — Ambulatory Visit: Payer: Medicare Other

## 2018-06-15 DIAGNOSIS — M5413 Radiculopathy, cervicothoracic region: Secondary | ICD-10-CM

## 2018-06-15 DIAGNOSIS — M542 Cervicalgia: Secondary | ICD-10-CM

## 2018-06-15 DIAGNOSIS — G8929 Other chronic pain: Secondary | ICD-10-CM

## 2018-06-15 DIAGNOSIS — R262 Difficulty in walking, not elsewhere classified: Secondary | ICD-10-CM

## 2018-06-15 DIAGNOSIS — M545 Low back pain: Secondary | ICD-10-CM | POA: Diagnosis not present

## 2018-06-15 DIAGNOSIS — M6281 Muscle weakness (generalized): Secondary | ICD-10-CM

## 2018-06-15 DIAGNOSIS — M5416 Radiculopathy, lumbar region: Secondary | ICD-10-CM

## 2018-06-15 NOTE — Patient Instructions (Signed)
MedbridgeAccess Code: [email or web address]  Seated Cervical Retraction  10x3 with 5 second holds
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
## 2018-06-17 ENCOUNTER — Encounter: Payer: Self-pay | Admitting: Physician Assistant

## 2018-06-17 ENCOUNTER — Ambulatory Visit (INDEPENDENT_AMBULATORY_CARE_PROVIDER_SITE_OTHER): Payer: Medicare Other | Admitting: Physician Assistant

## 2018-06-17 VITALS — BP 158/60 | HR 67 | Ht 67.0 in | Wt 189.6 lb

## 2018-06-17 DIAGNOSIS — E039 Hypothyroidism, unspecified: Secondary | ICD-10-CM

## 2018-06-17 DIAGNOSIS — I2510 Atherosclerotic heart disease of native coronary artery without angina pectoris: Secondary | ICD-10-CM

## 2018-06-17 DIAGNOSIS — I10 Essential (primary) hypertension: Secondary | ICD-10-CM

## 2018-06-17 DIAGNOSIS — Z9989 Dependence on other enabling machines and devices: Secondary | ICD-10-CM

## 2018-06-17 DIAGNOSIS — G4733 Obstructive sleep apnea (adult) (pediatric): Secondary | ICD-10-CM

## 2018-06-17 DIAGNOSIS — Z950 Presence of cardiac pacemaker: Secondary | ICD-10-CM | POA: Diagnosis not present

## 2018-06-17 DIAGNOSIS — E119 Type 2 diabetes mellitus without complications: Secondary | ICD-10-CM

## 2018-06-17 DIAGNOSIS — M4802 Spinal stenosis, cervical region: Secondary | ICD-10-CM

## 2018-06-17 MED ORDER — PRAVASTATIN SODIUM 20 MG PO TABS: 20.0000 mg | ORAL_TABLET | Freq: Every evening | ORAL | 3 refills | Status: DC

## 2018-06-17 MED ORDER — LISINOPRIL 10 MG PO TABS: 10.0000 mg | ORAL_TABLET | Freq: Every day | ORAL | 1 refills | Status: DC

## 2018-06-17 NOTE — Patient Instructions (Signed)
Medication Instructions:  Start Pravachol 20 mg daily. Increase Lisinopril 10 mg daily. You may start these medications when you return from vacation. If you need a refill on your cardiac medications before your next appointment, please call your pharmacy.   Follow-Up: At [facility], you and your health needs are our priority.  As part of our continuing mission to provide you with exceptional heart care, we have created designated Provider Care Teams.  These Care Teams include your primary Cardiologist (physician) and Advanced Practice Providers (APPs -  Physician Assistants and Nurse Practitioners) who all work together to provide you with the care you need, when you need it. You will need a follow up appointment in 5-6 months.  Please call our office 2 months in advance to schedule this appointment.  You may see [name], MD or one of the following Advanced Practice Providers on your designated Care Team: [place], [place] . Fabian Sharp, PA-C
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
## 2018-06-19 ENCOUNTER — Encounter: Payer: Self-pay | Admitting: Physician Assistant

## 2018-06-22 ENCOUNTER — Ambulatory Visit: Payer: Medicare Other | Admitting: Cardiovascular Disease

## 2018-06-30 ENCOUNTER — Ambulatory Visit: Payer: Medicare Other | Attending: Neurosurgery

## 2018-06-30 DIAGNOSIS — M6281 Muscle weakness (generalized): Secondary | ICD-10-CM | POA: Insufficient documentation

## 2018-06-30 DIAGNOSIS — G8929 Other chronic pain: Secondary | ICD-10-CM | POA: Insufficient documentation

## 2018-06-30 DIAGNOSIS — M545 Low back pain: Secondary | ICD-10-CM | POA: Diagnosis present

## 2018-06-30 DIAGNOSIS — M542 Cervicalgia: Secondary | ICD-10-CM | POA: Diagnosis present

## 2018-06-30 DIAGNOSIS — M5413 Radiculopathy, cervicothoracic region: Secondary | ICD-10-CM | POA: Diagnosis present

## 2018-06-30 DIAGNOSIS — R262 Difficulty in walking, not elsewhere classified: Secondary | ICD-10-CM | POA: Diagnosis present

## 2018-06-30 DIAGNOSIS — M5416 Radiculopathy, lumbar region: Secondary | ICD-10-CM | POA: Insufficient documentation

## 2018-06-30 NOTE — Therapy (Signed)
[facility] [address], [place], [address] Phone: [phone]   Fax:  [phone]  Physical Therapy Treatment  Patient Details  [name] MRN: [id number] Date of Birth: 10-19-1940 Referring Provider (PT): Malen Gauze, MD   Encounter Date: 06/30/2018  PT End of Session - 06/30/18 1511    Visit Number  13    Number of Visits  23    Date for PT Re-Evaluation  07/22/18    Authorization Type  5    Authorization Time Period  of 10 progress report    PT Start Time  5035    PT Stop Time  1551    PT Time Calculation (min)  40 min    Activity Tolerance  Patient tolerated treatment well    Behavior During Therapy  [facility] for tasks assessed/performed       Past Medical History:  Diagnosis Date  . Appendicitis   . Atrial fibrillation ([place]) 12/12/2013  . BPH (benign prostatic hypertrophy)   . CAD (coronary artery disease) 12/2015   Cath by Dr [name] reveals distal and small vessel CAD.  Medical therapy advised.  . Chest pain 12/03/2015  . CHF (congestive heart failure) ([place])   . 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 Coronary artery disease   . Coronary artery disease involving native coronary artery of native heart with unstable angina pectoris ([place]) 08/11/2017  . History of blood transfusion 1968   "probably; related to getting wounded in [place]"  . History of kidney stones   . Hyperglycemia 11/05/2013  . Hyperlipidemia 11/05/2013  . Hypertension   . Hypothyroidism   . Hypothyroidism, unspecified 11/05/2013  . Inflammatory arthritis 11/05/2013  . Onychomycosis 12/20/2015  . OSA (obstructive sleep apnea) 10/26/2017    AHI of 8.1/h overall and 6.2/h during REM sleep.  AHI was 20/h while supine.  Oxygen saturations dropped to 87%.  Now on CPAP at 7cm H2O  . OSA on CPAP   . 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 Rheumatoid arthritis ([place])    "hands"  (08/11/2017)  . Type II diabetes mellitus ([place])     Past Surgical History:  Procedure Laterality Date  . BACK SURGERY    . CARDIAC CATHETERIZATION N/A 12/28/2015   Procedure: Left Heart Cath and Coronary Angiography;  Surgeon: Belva Crome, MD;  Location: [facility];  Service: Cardiovascular;  Laterality: N/A;  . CATARACT EXTRACTION W/ INTRAOCULAR LENS  IMPLANT, BILATERAL Bilateral   . CORONARY ANGIOPLASTY WITH STENT PLACEMENT  08/11/2017   "2 stents"  . CORONARY STENT INTERVENTION N/A 08/11/2017   Procedure: CORONARY STENT INTERVENTION;  Surgeon: Troy Sine, MD;  Location: [facility];  Service: Cardiovascular;  Laterality: N/A;  . CYSTOSCOPY W/ STONE MANIPULATION    . INGUINAL HERNIA REPAIR Left   . INSERT / REPLACE / REMOVE PACEMAKER  12/09/2011   SJM Accent DR RF implanted by DR Allred for complete heart block and syncope  . JOINT REPLACEMENT    . LAPAROSCOPIC CHOLECYSTECTOMY    . LITHOTRIPSY    . LUMBAR DISC SURGERY     "removed arthritis and spurs"  . PERMANENT PACEMAKER INSERTION N/A 12/09/2011   Procedure: PERMANENT PACEMAKER INSERTION;  Surgeon: [name], MD;  Location: [facility];  Service: Cardiovascular;  Laterality: N/A;  . REPLACEMENT TOTAL KNEE Right   . RIGHT/LEFT HEART CATH AND CORONARY ANGIOGRAPHY  N/A 08/11/2017   Procedure: RIGHT/LEFT HEART CATH AND CORONARY ANGIOGRAPHY;  Surgeon: Larey Dresser, MD;  Location: [facility];  Service: Cardiovascular;  Laterality: N/A;  . TRANSURETHRAL RESECTION OF PROSTATE  2017/2018    There were no vitals filed for this visit.  Subjective Assessment - 06/30/18 1512    Subjective  Able to walk on sand at [place] and thought that he did great. Also walked out into the water and was careful with it and his back, neck, legs did alright.  2/10 B LE symptoms at most for the past 7 days. The B iner thigh symptpoms has not returned. Still has some numbness in his feet and gets hot once in a while. Other than  that, its doing ok.   2-3/10 neck pain, 2/10 R UE, 3-4/10 L UE symptoms, 2/10 back pain at most for the past 7 days.  2/10 neck pain currently. Almost no neck pain.  The chin tucks and the shoulder squeeze takes the neck pain away.  Would like to do a few more sessions of PT.  Better able to do his exercises at the clinic.   Balance sometimes gets off a little bit. Thinks its related to his feet.     Pertinent History  Myelopathy. Symptoms began about 3.5 months ago starting with his L hand and fingers and radiated up his L arm (along the ulnar and C8/T1 dermatome).  Pain stopped at his L shoulder.  Pt also states having pain across his shoulders,  Pt then had similar symptoms at same areas in his R UE. Pain then went down his B inner thighs L greater than the R down to his great toe.  Pt also states having low back pain.  Neck bothers pt more than low back.  Has a little bit of bladder incontinence since last year and does not know if Dr. Lacinda Axon knows about it. Has a little bit of a drip when he stands up at times.  Has bowel control.  Has not had any imaging for his low back in the last few years.   Pt is R hand dominant.  Denies numbness at S3-5 dermatomes.   Pt states he thinks Dr. Lacinda Axon knows about his B inner thigh numbness.    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       Patient Stated Goals  Be more stable and walk better.     Currently in Pain?  Yes    Pain Score  2     Pain Onset  More than a month ago         [facility] PT Assessment - 06/30/18 0001      Dynamic Gait Index   Level Surface  Normal    Change in Gait Speed  Normal    Gait with Horizontal Head Turns  Mild Impairment    Gait with Vertical Head Turns  Mild Impairment    Gait and Pivot Turn  Normal    Step Over Obstacle  Normal    Step Around Obstacles  Normal    Steps  Mild Impairment    Total Score  21    DGI comment:  < 19/24 suggests  increased fall risk                           PT Education -  06/30/18 1601    Education Details  ther-ex, balance, progress, plan of care    Person(s) Educated  Patient    Methods  Explanation;Demonstration;Tactile cues;Verbal cues    Comprehension  Returned demonstration;Verbalized understanding         Objective: Does not know if he has a fusion in his back  Pacemaker  no latex allergies   MedbridgeAccess Code: [email or web address]  Gait: R pelvic drop during L LE stance phase   Therapeutic exercise  Running man with one UE assist   R 10x2  L 10x2  Tandem walk on 10 ft line CGA  2x fwd  2x backward    R lateral lean and LOB when on L LE stance phase   SLS with B UE light touch assist              R LE 10x5 seconds for 2 sets             L LE 10x5 seconds for 2 sets  Directed patient with gait with normal gait speed, with changes in speed, 180 degree pivot turn, with R and L cervical rotation position, with cervical flexion and extension position, stepping around obstacles, stepping over an obstacle, ascending and descending 4 regular steps with UE assist   Decreased steadiness and quad weakness observed when going down stairs without UE assist.   Reviewed POC: continue a few more visits to continue progress and improve balance.   Lateral step downs to promote eccentric quadriceps strength with B UE assist to promote ability to descend stairs.   R LE 5x  L LE 5x   Very difficulty for B LE  Lateral step down from 4 inch step with B UE assist to promote ability to descend stairs.    R LE 5x. Medium level effort.   L LE 5x. Difficult   Improved exercise technique, movement at target joints, use of target muscles after min to mod verbal, visual, tactile cues.    Pt demonstrates significant improvement of back, neck, B UE and B LE symptoms since initial evaluation. Pt also demonstrates improved steadiness with gait observed and when  performing standing tasks. Per pt, he was able to ambulate on sand and walk in the water at his trip to [place] and did alright with it. Pt scored a 21/24 with her Dynamic Gait Index but would also like to continue physical therapy to improve balance, strength, and progress with pain and symptoms. Feels like he is better able to perform his exercises in the clinic than at home. Pt does demonstrate bilateral eccentric quadriceps weakness when descending 4 regular steps without UE assist, resulting in unsteadiness. Pt still demonstrates some neck, back, B UE and LE symptoms, weakness, and difficulty performing functional tasks and would benefit from continued skilled physical therapy services to address the aforementioned deficits as well as to continue progress.       PT Short Term Goals - 06/30/18 1603      PT SHORT TERM GOAL #1   Title  Patient will be independent with his HEP to decrease pain, improve strength and function.     Baseline  Pt demonstrates independence with his HEP (06/30/2018)    Time  3    Period  Weeks    Status  Achieved    Target Date  05/20/18        PT Long Term Goals - 06/30/18 1604      PT LONG TERM GOAL #1   Title  Patient will have a decrease in neck pain to 4/10 or less at worst to promote ability to perform sitting tasks.    Baseline  9/10 neck pain at worst for the past month (04/26/2018); 5/10 at most for the past 7 days (06/01/2018); 3/10 at worst for the past 7 days (06/15/2018); 2-3/10 at worst (06/30/2018)    Time  8    Period  Weeks    Status  Achieved    Target Date  06/24/18      PT LONG TERM GOAL #2   Title  Patient will have a decrease in B UE paresthesia to 4/10 or less at worst to promote ability to perform functional tasks wiht his UE.     Baseline  Paresthesia: L UE 9/10, R UE 8/10 at worst for the past month (04/26/2018); 7/10 L UE, 4/10 R UE at most for the past 7 days (06/01/2018); 4/10 L UE, 2/10 R UE at most for the past 7 days (06/15/2018); 2/10 R  UE, 3-4/10 L UE at worst (06/30/2018)    Time  8    Period  Weeks    Status  Achieved    Target Date  06/24/18      PT LONG TERM GOAL #3   Title  Patient will have a decrease in back pain to 4/10 or less at worst to promote ability to ambulate, perform standing tasks.     Baseline  8/10 at worst for the past month (04/26/2018); 4/10 at most for the past 7 days (06/01/2018); 2/10 back pain at most (06/30/2018)    Time  8    Period  Weeks    Status  Achieved    Target Date  06/24/18      PT LONG TERM GOAL #4   Title  Patient will have a decrease in bilateral LE paresthesia to to 3/10 or less at worst to promote ability to perform standing tasks as well as ambulate.     Baseline  Paresthesia: L LE 8/10, R LE 6/10 at worst for the past month (04/26/2018); 4/10 L LE, 3/10 R LE at most for the past 7 days (06/01/2017);  4/10 L LE, 3/10 R LE at most for the past 7 days (06/15/2018); 2/10 B LE symptoms at most (06/30/2018)    Time  8    Period  Weeks    Status  Achieved    Target Date  06/24/18      PT LONG TERM GOAL #5   Title  Patient will improve TUG time to 12 seconds or less to promote functional mobility     Baseline  13.2 seconds average without AD (04/26/2018); 9.8 seconds average without AD (06/01/2018)    Time  8    Period  Weeks    Status  Achieved    Target Date  06/24/18      PT LONG TERM GOAL #6   Title  Patient will improve his lower leg FOTO score by at least 10 points as a demonstration of improved function.     Baseline  30 (04/26/2018); 45 (06/01/2017)    Time  8    Period  Weeks    Status  Achieved    Target Date  06/24/18      PT LONG TERM GOAL #7   Title  Pt will improve his DGI score to 23/24 or more as a demonstration of improved balance.     Baseline  21/24 (06/30/2018)    Time  3  Period  Weeks    Status  New    Target Date  07/22/18            Plan - 06/30/18 1509    Clinical Impression Statement  Pt demonstrates significant improvement of back, neck, B UE and B  LE symptoms since initial evaluation. Pt also demonstrates improved steadiness with gait observed and when performing standing tasks. Per pt, he was able to ambulate on sand and walk in the water at his trip to [place] and did alright with it. Pt scored a 21/24 with her Dynamic Gait Index but would also like to continue physical therapy to improve balance, strength, and progress with pain and symptoms. Feels like he is better able to perform his exercises in the clinic than at home. Pt does demonstrate bilateral eccentric quadriceps weakness when descending 4 regular steps without UE assist, resulting in unsteadiness. Pt still demonstrates some neck, back, B UE and LE symptoms, weakness, and difficulty performing functional tasks and would benefit from continued skilled physical therapy services to address the aforementioned deficits as well as to continue progress.     History and Personal Factors relevant to plan of care:  Multiple areas of paresthesia, neck and back pain, weakness, medical history    Clinical Presentation  Stable    Clinical Presentation due to:  Significant decrease in pain and radiating symptoms. Improved steadiness with gait.     Clinical Decision Making  Low    Rehab Potential  Fair    Clinical Impairments Affecting Rehab Potential  (-) medical history, weakness, B UE and LE paresthesia, age, neck and back pain. (+) decreased bilateral LE paresthesia during evaluation, motivated.     PT Frequency  2x / week    PT Duration  6 weeks    PT Treatment/Interventions  Aquatic Therapy;Traction;Gait training;Therapeutic activities;Therapeutic exercise;Balance training;Neuromuscular re-education;Patient/family education;Manual techniques;Dry needling   traction if appropriate   PT Next Visit Plan  posture, gentle lateral shift correction, gentle back extension, scapular strengthening, thoracic extension, glute and trunk strengthening     Consulted and Agree with Plan of Care  Patient        Patient will benefit from skilled therapeutic intervention in order to improve the following deficits and impairments:  Pain, Improper body mechanics, Postural dysfunction, Decreased strength, Difficulty walking, Decreased balance, Abnormal gait  Visit Diagnosis: Chronic bilateral low back pain, unspecified whether sciatica present - Plan: PT plan of care cert/re-cert  Radiculopathy, lumbar region - Plan: PT plan of care cert/re-cert  Muscle weakness (generalized) - Plan: PT plan of care cert/re-cert  Difficulty in walking, not elsewhere classified - Plan: PT plan of care cert/re-cert  Cervicalgia - Plan: PT plan of care cert/re-cert  Radiculopathy, cervicothoracic region - Plan: PT plan of care cert/re-cert     Problem List Patient Active Problem List   Diagnosis Date Noted  . OSA (obstructive sleep apnea) 10/26/2017  . Coronary artery disease involving native coronary artery of native heart with unstable angina pectoris ([place]) 08/11/2017  . Coronary artery disease   . 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 D-dimer, elevated 04/03/2017  . S/P TURP 01/05/2017  . Right lower quadrant abdominal pain   . Acute kidney injury ([place]) 09/09/2016  . Appendicitis   . Onychomycosis 12/20/2015  . Chest pain 12/03/2015  . Atrial fibrillation ([place]) 12/12/2013  . Arthritis 11/05/2013  . Hyperglycemia 11/05/2013  . Hyperglycemia, unspecified 11/05/2013  . Hyperlipidemia 11/05/2013  . Hyperlipidemia, unspecified 11/05/2013  . Hypothyroidism 11/05/2013  . Hypothyroidism,  unspecified 11/05/2013  . Inflammatory arthritis 11/05/2013  . OA (osteoarthritis) 11/05/2013  . Osteoarthritis 11/05/2013  . Male erectile dysfunction 06/18/2012  . Urge incontinence 06/18/2012  . 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 Complete heart block ([place]) 12/09/2011  . Hypertension 12/09/2011  . Rheumatoid arthritis(714.0) 12/09/2011  . Rheumatoid arthritis ([place]) 12/09/2011    Joneen Boers PT, DPT   06/30/2018,  4:14 PM  [facility] [address], [place], [address] Phone: [phone]   Fax:  [phone]  [name] MRN: [id number] Date of Birth: 1940-07-27

## 2018-07-06 ENCOUNTER — Ambulatory Visit: Payer: Medicare Other

## 2018-07-06 DIAGNOSIS — G8929 Other chronic pain: Secondary | ICD-10-CM

## 2018-07-06 DIAGNOSIS — M5416 Radiculopathy, lumbar region: Secondary | ICD-10-CM

## 2018-07-06 DIAGNOSIS — M545 Low back pain: Secondary | ICD-10-CM | POA: Diagnosis not present

## 2018-07-06 DIAGNOSIS — R262 Difficulty in walking, not elsewhere classified: Secondary | ICD-10-CM

## 2018-07-06 DIAGNOSIS — M5413 Radiculopathy, cervicothoracic region: Secondary | ICD-10-CM

## 2018-07-06 DIAGNOSIS — M542 Cervicalgia: Secondary | ICD-10-CM

## 2018-07-06 DIAGNOSIS — M6281 Muscle weakness (generalized): Secondary | ICD-10-CM

## 2018-07-06 NOTE — Therapy (Signed)
[facility] [address], [place], [address] Phone: [phone]   Fax:  [phone]  Physical Therapy Treatment  Patient Details  [name] MRN: [id number] Date of Birth: 1941-02-14 Referring Provider (PT): Malen Gauze, MD   Encounter Date: 07/06/2018  PT End of Session - 07/06/18 0802    Visit Number  14    Number of Visits  23    Date for PT Re-Evaluation  07/22/18    Authorization Type  6    Authorization Time Period  of 10 progress report    PT Start Time  0802    PT Stop Time  0845    PT Time Calculation (min)  43 min    Activity Tolerance  Patient tolerated treatment well    Behavior During Therapy  [facility] for tasks assessed/performed       Past Medical History:  Diagnosis Date  . Appendicitis   . Atrial fibrillation ([place]) 12/12/2013  . BPH (benign prostatic hypertrophy)   . CAD (coronary artery disease) 12/2015   Cath by Dr [name] reveals distal and small vessel CAD.  Medical therapy advised.  . Chest pain 12/03/2015  . CHF (congestive heart failure) ([place])   . 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 Coronary artery disease   . Coronary artery disease involving native coronary artery of native heart with unstable angina pectoris ([place]) 08/11/2017  . History of blood transfusion 1968   "probably; related to getting wounded in [place]"  . History of kidney stones   . Hyperglycemia 11/05/2013  . Hyperlipidemia 11/05/2013  . Hypertension   . Hypothyroidism   . Hypothyroidism, unspecified 11/05/2013  . Inflammatory arthritis 11/05/2013  . Onychomycosis 12/20/2015  . OSA (obstructive sleep apnea) 10/26/2017    AHI of 8.1/h overall and 6.2/h during REM sleep.  AHI was 20/h while supine.  Oxygen saturations dropped to 87%.  Now on CPAP at 7cm H2O  . OSA on CPAP   . 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 Rheumatoid arthritis ([place])    "hands"  (08/11/2017)  . Type II diabetes mellitus ([place])     Past Surgical History:  Procedure Laterality Date  . BACK SURGERY    . CARDIAC CATHETERIZATION N/A 12/28/2015   Procedure: Left Heart Cath and Coronary Angiography;  Surgeon: Belva Crome, MD;  Location: [facility];  Service: Cardiovascular;  Laterality: N/A;  . CATARACT EXTRACTION W/ INTRAOCULAR LENS  IMPLANT, BILATERAL Bilateral   . CORONARY ANGIOPLASTY WITH STENT PLACEMENT  08/11/2017   "2 stents"  . CORONARY STENT INTERVENTION N/A 08/11/2017   Procedure: CORONARY STENT INTERVENTION;  Surgeon: Troy Sine, MD;  Location: [facility];  Service: Cardiovascular;  Laterality: N/A;  . CYSTOSCOPY W/ STONE MANIPULATION    . INGUINAL HERNIA REPAIR Left   . INSERT / REPLACE / REMOVE PACEMAKER  12/09/2011   SJM Accent DR RF implanted by DR Allred for complete heart block and syncope  . JOINT REPLACEMENT    . LAPAROSCOPIC CHOLECYSTECTOMY    . LITHOTRIPSY    . LUMBAR DISC SURGERY     "removed arthritis and spurs"  . PERMANENT PACEMAKER INSERTION N/A 12/09/2011   Procedure: PERMANENT PACEMAKER INSERTION;  Surgeon: [name], MD;  Location: [facility];  Service: Cardiovascular;  Laterality: N/A;  . REPLACEMENT TOTAL KNEE Right   . RIGHT/LEFT HEART CATH AND CORONARY ANGIOGRAPHY  N/A 08/11/2017   Procedure: RIGHT/LEFT HEART CATH AND CORONARY ANGIOGRAPHY;  Surgeon: Larey Dresser, MD;  Location: [facility];  Service: Cardiovascular;  Laterality: N/A;  . TRANSURETHRAL RESECTION OF PROSTATE  2017/2018    There were no vitals filed for this visit.  Subjective Assessment - 07/06/18 0803    Subjective  Has an appointment with Dr. Manuella Ghazi at 9 am today. 2/10 neck, and low back. L hand is slowly getting better. R hand is 98 % better. Legs are ok. Still had the burning in his feet.  [place] ok. Going down steps was harder.     Pertinent History  Myelopathy. Symptoms began about  3.5 months ago starting with his L hand and fingers and radiated up his L arm (along the ulnar and C8/T1 dermatome).  Pain stopped at his L shoulder.  Pt also states having pain across his shoulders,  Pt then had similar symptoms at same areas in his R UE. Pain then went down his B inner thighs L greater than the R down to his great toe.  Pt also states having low back pain.  Neck bothers pt more than low back.  Has a little bit of bladder incontinence since last year and does not know if Dr. Lacinda Axon knows about it. Has a little bit of a drip when he stands up at times.  Has bowel control.  Has not had any imaging for his low back in the last few years.   Pt is R hand dominant.  Denies numbness at S3-5 dermatomes.   Pt states he thinks Dr. Lacinda Axon knows about his B inner thigh numbness.    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       Patient Stated Goals  Be more stable and walk better.     Currently in Pain?  Yes    Pain Score  2     Pain Onset  More than a month ago                              Objective: Does not know if he has a fusion in his back  Pacemaker  no latex allergies   MedbridgeAccess Code: [email or web address]  Gait: R pelvic drop during L LE stance phase   Therapeutic exercise  Standing ankle DF/PF on rocker board 2 min to promote LE mobility  Running man with one UE assist              R 10x2             L 10x, then 6x L LE buckled. Pt recovered independently. CGA.   Standing L shoulder IR red band 10x, then 10x 5 second holds for 2 sets  Decreased L 5th digit parethtesia   Improved exercise technique, movement at target joints, use of target muscles after min to mod verbal, visual, tactile cues.     Manual therapy  Seated STM L rhomboid muscle to decrease tension Seated STM to R and L cervical paraspinal muscles  Decreased neck pain with  aforementioned treatment   Seated STM to R rhomboid muscle to decrease tension   Seated STM L infraspinatus and teres major muscles. Decreased L pinky paresthesia    Decreased L 5th digit paresthesia  with soft tissue mobilization as well as exercise (L shoulder IR with red band) to decrease tension of infraspinatus and teres major muscles on his  L UE nerves. Continued working on glute max strengthening and LE neural mobility to promote ability to perform standing tasks with less difficulty. Improved overall steadiness with gait observed. Pt will benefit from continued skilled physical therapy services to decrease pain, UE and LE paresthesia, improve strength, balance, and function.          PT Education - 07/06/18 0813    Education Details  ther-ex    Person(s) Educated  Patient    Methods  Explanation;Demonstration;Tactile cues;Verbal cues    Comprehension  Returned demonstration;Verbalized understanding       PT Short Term Goals - 06/30/18 1603      PT SHORT TERM GOAL #1   Title  Patient will be independent with his HEP to decrease pain, improve strength and function.     Baseline  Pt demonstrates independence with his HEP (06/30/2018)    Time  3    Period  Weeks    Status  Achieved    Target Date  05/20/18        PT Long Term Goals - 06/30/18 1604      PT LONG TERM GOAL #1   Title  Patient will have a decrease in neck pain to 4/10 or less at worst to promote ability to perform sitting tasks.    Baseline  9/10 neck pain at worst for the past month (04/26/2018); 5/10 at most for the past 7 days (06/01/2018); 3/10 at worst for the past 7 days (06/15/2018); 2-3/10 at worst (06/30/2018)    Time  8    Period  Weeks    Status  Achieved    Target Date  06/24/18      PT LONG TERM GOAL #2   Title  Patient will have a decrease in B UE paresthesia to 4/10 or less at worst to promote ability to perform functional tasks wiht his UE.     Baseline  Paresthesia: L UE 9/10, R UE 8/10 at  worst for the past month (04/26/2018); 7/10 L UE, 4/10 R UE at most for the past 7 days (06/01/2018); 4/10 L UE, 2/10 R UE at most for the past 7 days (06/15/2018); 2/10 R UE, 3-4/10 L UE at worst (06/30/2018)    Time  8    Period  Weeks    Status  Achieved    Target Date  06/24/18      PT LONG TERM GOAL #3   Title  Patient will have a decrease in back pain to 4/10 or less at worst to promote ability to ambulate, perform standing tasks.     Baseline  8/10 at worst for the past month (04/26/2018); 4/10 at most for the past 7 days (06/01/2018); 2/10 back pain at most (06/30/2018)    Time  8    Period  Weeks    Status  Achieved    Target Date  06/24/18      PT LONG TERM GOAL #4   Title  Patient will have a decrease in bilateral LE paresthesia to to 3/10 or less at worst to promote ability to perform standing tasks as well as ambulate.     Baseline  Paresthesia: L LE 8/10, R LE 6/10 at worst for the past month (04/26/2018); 4/10 L LE, 3/10 R LE at most for the past 7 days (06/01/2017);  4/10 L  LE, 3/10 R LE at most for the past 7 days (06/15/2018); 2/10 B LE symptoms at most (06/30/2018)    Time  8    Period  Weeks    Status  Achieved    Target Date  06/24/18      PT LONG TERM GOAL #5   Title  Patient will improve TUG time to 12 seconds or less to promote functional mobility     Baseline  13.2 seconds average without AD (04/26/2018); 9.8 seconds average without AD (06/01/2018)    Time  8    Period  Weeks    Status  Achieved    Target Date  06/24/18      PT LONG TERM GOAL #6   Title  Patient will improve his lower leg FOTO score by at least 10 points as a demonstration of improved function.     Baseline  30 (04/26/2018); 45 (06/01/2017)    Time  8    Period  Weeks    Status  Achieved    Target Date  06/24/18      PT LONG TERM GOAL #7   Title  Pt will improve his DGI score to 23/24 or more as a demonstration of improved balance.     Baseline  21/24 (06/30/2018)    Time  3    Period  Weeks    Status  New     Target Date  07/22/18            Plan - 07/06/18 0848    Clinical Impression Statement  Decreased L 5th digit paresthesia with soft tissue mobilization as well as exercise (L shoulder IR with red band) to decrease tension of infraspinatus and teres major muscles on his  L UE nerves. Continued working on glute max strengthening and LE neural mobility to promote ability to perform standing tasks with less difficulty. Improved overall steadiness with gait observed. Pt will benefit from continued skilled physical therapy services to decrease pain, UE and LE paresthesia, improve strength, balance, and function.     Rehab Potential  Fair    Clinical Impairments Affecting Rehab Potential  (-) medical history, weakness, B UE and LE paresthesia, age, neck and back pain. (+) decreased bilateral LE paresthesia during evaluation, motivated.     PT Frequency  2x / week    PT Duration  6 weeks    PT Treatment/Interventions  Aquatic Therapy;Traction;Gait training;Therapeutic activities;Therapeutic exercise;Balance training;Neuromuscular re-education;Patient/family education;Manual techniques;Dry needling   traction if appropriate   PT Next Visit Plan  posture, gentle lateral shift correction, gentle back extension, scapular strengthening, thoracic extension, glute and trunk strengthening     Consulted and Agree with Plan of Care  Patient       Patient will benefit from skilled therapeutic intervention in order to improve the following deficits and impairments:  Pain, Improper body mechanics, Postural dysfunction, Decreased strength, Difficulty walking, Decreased balance, Abnormal gait  Visit Diagnosis: Chronic bilateral low back pain, unspecified whether sciatica present  Radiculopathy, lumbar region  Muscle weakness (generalized)  Difficulty in walking, not elsewhere classified  Cervicalgia  Radiculopathy, cervicothoracic region     Problem List Patient Active Problem List   Diagnosis  Date Noted  . OSA (obstructive sleep apnea) 10/26/2017  . Coronary artery disease involving native coronary artery of native heart with unstable angina pectoris ([place]) 08/11/2017  . Coronary artery disease   . 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 D-dimer, elevated 04/03/2017  . S/P TURP 01/05/2017  . Right lower  quadrant abdominal pain   . Acute kidney injury ([place]) 09/09/2016  . Appendicitis   . Onychomycosis 12/20/2015  . Chest pain 12/03/2015  . Atrial fibrillation ([place]) 12/12/2013  . Arthritis 11/05/2013  . Hyperglycemia 11/05/2013  . Hyperglycemia, unspecified 11/05/2013  . Hyperlipidemia 11/05/2013  . Hyperlipidemia, unspecified 11/05/2013  . Hypothyroidism 11/05/2013  . Hypothyroidism, unspecified 11/05/2013  . Inflammatory arthritis 11/05/2013  . OA (osteoarthritis) 11/05/2013  . Osteoarthritis 11/05/2013  . Male erectile dysfunction 06/18/2012  . Urge incontinence 06/18/2012  . 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 Complete heart block ([place]) 12/09/2011  . Hypertension 12/09/2011  . Rheumatoid arthritis(714.0) 12/09/2011  . Rheumatoid arthritis ([place]) 12/09/2011    Joneen Boers PT, DPT   07/06/2018, 8:56 AM  [facility] [address] [place], [address] Phone: [phone]   Fax:  [phone]  [name] MRN: [id number] Date of Birth: [birth year]

## 2018-07-13 ENCOUNTER — Ambulatory Visit: Payer: Medicare Other

## 2018-07-13 DIAGNOSIS — M5416 Radiculopathy, lumbar region: Secondary | ICD-10-CM

## 2018-07-13 DIAGNOSIS — M542 Cervicalgia: Secondary | ICD-10-CM

## 2018-07-13 DIAGNOSIS — M5413 Radiculopathy, cervicothoracic region: Secondary | ICD-10-CM

## 2018-07-13 DIAGNOSIS — M545 Low back pain: Secondary | ICD-10-CM | POA: Diagnosis not present

## 2018-07-13 DIAGNOSIS — M6281 Muscle weakness (generalized): Secondary | ICD-10-CM

## 2018-07-13 DIAGNOSIS — R262 Difficulty in walking, not elsewhere classified: Secondary | ICD-10-CM

## 2018-07-13 DIAGNOSIS — G8929 Other chronic pain: Secondary | ICD-10-CM

## 2018-07-13 NOTE — Therapy (Signed)
[facility] [address], [place], [address] Phone: [phone]   Fax:  [phone]  Physical Therapy Treatment  Patient Details  [name] MRN: [id number] Date of Birth: 05-12-1941 Referring Provider (PT): Malen Gauze, MD   Encounter Date: 07/13/2018  PT End of Session - 07/13/18 0805    Visit Number  15    Number of Visits  23    Date for PT Re-Evaluation  07/22/18    Authorization Type  7    Authorization Time Period  of 10 progress report    PT Start Time  0805    PT Stop Time  0846    PT Time Calculation (min)  41 min    Activity Tolerance  Patient tolerated treatment well    Behavior During Therapy  [facility] for tasks assessed/performed       Past Medical History:  Diagnosis Date  . Appendicitis   . Atrial fibrillation ([place]) 12/12/2013  . BPH (benign prostatic hypertrophy)   . CAD (coronary artery disease) 12/2015   Cath by Dr [name] reveals distal and small vessel CAD.  Medical therapy advised.  . Chest pain 12/03/2015  . CHF (congestive heart failure) ([place])   . 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 Coronary artery disease   . Coronary artery disease involving native coronary artery of native heart with unstable angina pectoris ([place]) 08/11/2017  . History of blood transfusion 1968   "probably; related to getting wounded in [place]"  . History of kidney stones   . Hyperglycemia 11/05/2013  . Hyperlipidemia 11/05/2013  . Hypertension   . Hypothyroidism   . Hypothyroidism, unspecified 11/05/2013  . Inflammatory arthritis 11/05/2013  . Onychomycosis 12/20/2015  . OSA (obstructive sleep apnea) 10/26/2017    AHI of 8.1/h overall and 6.2/h during REM sleep.  AHI was 20/h while supine.  Oxygen saturations dropped to 87%.  Now on CPAP at 7cm H2O  . OSA on CPAP   . 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 Rheumatoid arthritis ([place])    "hands"  (08/11/2017)  . Type II diabetes mellitus ([place])     Past Surgical History:  Procedure Laterality Date  . BACK SURGERY    . CARDIAC CATHETERIZATION N/A 12/28/2015   Procedure: Left Heart Cath and Coronary Angiography;  Surgeon: Belva Crome, MD;  Location: [facility];  Service: Cardiovascular;  Laterality: N/A;  . CATARACT EXTRACTION W/ INTRAOCULAR LENS  IMPLANT, BILATERAL Bilateral   . CORONARY ANGIOPLASTY WITH STENT PLACEMENT  08/11/2017   "2 stents"  . CORONARY STENT INTERVENTION N/A 08/11/2017   Procedure: CORONARY STENT INTERVENTION;  Surgeon: Troy Sine, MD;  Location: [facility];  Service: Cardiovascular;  Laterality: N/A;  . CYSTOSCOPY W/ STONE MANIPULATION    . INGUINAL HERNIA REPAIR Left   . INSERT / REPLACE / REMOVE PACEMAKER  12/09/2011   SJM Accent DR RF implanted by DR Allred for complete heart block and syncope  . JOINT REPLACEMENT    . LAPAROSCOPIC CHOLECYSTECTOMY    . LITHOTRIPSY    . LUMBAR DISC SURGERY     "removed arthritis and spurs"  . PERMANENT PACEMAKER INSERTION N/A 12/09/2011   Procedure: PERMANENT PACEMAKER INSERTION;  Surgeon: [name], MD;  Location: [facility];  Service: Cardiovascular;  Laterality: N/A;  . REPLACEMENT TOTAL KNEE Right   . RIGHT/LEFT HEART CATH AND CORONARY ANGIOGRAPHY  N/A 08/11/2017   Procedure: RIGHT/LEFT HEART CATH AND CORONARY ANGIOGRAPHY;  Surgeon: Larey Dresser, MD;  Location: [facility];  Service: Cardiovascular;  Laterality: N/A;  . TRANSURETHRAL RESECTION OF PROSTATE  2017/2018    There were no vitals filed for this visit.  Subjective Assessment - 07/13/18 0807    Subjective  Pt states that his dermatoligist added a pill in his medications but does not know what it is. Did some kettle bell (25 lbs) the other day and felt a twinge in his lower back.  Was doing it fine before.  3/10 low back pain currently. Not bad.  L pinky is not so bad. Still has a numb feeling but its better than it was.      Pertinent History  Myelopathy. Symptoms began about 3.5 months ago starting with his L hand and fingers and radiated up his L arm (along the ulnar and C8/T1 dermatome).  Pain stopped at his L shoulder.  Pt also states having pain across his shoulders,  Pt then had similar symptoms at same areas in his R UE. Pain then went down his B inner thighs L greater than the R down to his great toe.  Pt also states having low back pain.  Neck bothers pt more than low back.  Has a little bit of bladder incontinence since last year and does not know if Dr. Lacinda Axon knows about it. Has a little bit of a drip when he stands up at times.  Has bowel control.  Has not had any imaging for his low back in the last few years.   Pt is R hand dominant.  Denies numbness at S3-5 dermatomes.   Pt states he thinks Dr. Lacinda Axon knows about his B inner thigh numbness.    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       Patient Stated Goals  Be more stable and walk better.     Currently in Pain?  Yes    Pain Score  3     Pain Onset  More than a month ago                               PT Education - 07/13/18 0812    Education Details  ther-ex    Person(s) Educated  Patient    Methods  Explanation;Demonstration;Tactile cues;Verbal cues    Comprehension  Returned demonstration;Verbalized understanding        Objective: Does not know if he has a fusion in his back  Pacemaker  no latex allergies   MedbridgeAccess Code: [email or web address]  Gait: R pelvic drop during L LE stance phase   Therapeutic exercise  Standing R lateral shift correction 10x5 seconds for 2 sets  Standing B shoulder extension blue band with B scapular retraction 10x, then 7x   standing straight pallof press double blue band 10x5 seconds for 3 sets  Decreased low back weakness sensation   Standing L shoulder IR green band 10x3  Decreased L  5th digit numbness    Improved exercise technique, movement at target joints, use of target muscles after mod verbal, visual, tactile cues.     Manual therapy   Seated STM L infraspinatus and teres major muscles. Decreased L pinky paresthesia  Seated STM L triceps and L supinator muscles    Improved L  5th digit sensation. Decreased back pain and decreased low back weakness sensation after treatment.     Decreased L UE numbness after treatment to decrease tension to muscles around L radial nerve.  Decreased back pain and decreased low back weakness sensation following exercises to improve posture, and trunk strengthening. Pt will benefit from continued skilled physical therapy services to continue to decrease pain, symptoms, improve strength, balance and function.     PT Short Term Goals - 06/30/18 1603      PT SHORT TERM GOAL #1   Title  Patient will be independent with his HEP to decrease pain, improve strength and function.     Baseline  Pt demonstrates independence with his HEP (06/30/2018)    Time  3    Period  Weeks    Status  Achieved    Target Date  05/20/18        PT Long Term Goals - 06/30/18 1604      PT LONG TERM GOAL #1   Title  Patient will have a decrease in neck pain to 4/10 or less at worst to promote ability to perform sitting tasks.    Baseline  9/10 neck pain at worst for the past month (04/26/2018); 5/10 at most for the past 7 days (06/01/2018); 3/10 at worst for the past 7 days (06/15/2018); 2-3/10 at worst (06/30/2018)    Time  8    Period  Weeks    Status  Achieved    Target Date  06/24/18      PT LONG TERM GOAL #2   Title  Patient will have a decrease in B UE paresthesia to 4/10 or less at worst to promote ability to perform functional tasks wiht his UE.     Baseline  Paresthesia: L UE 9/10, R UE 8/10 at worst for the past month (04/26/2018); 7/10 L UE, 4/10 R UE at most for the past 7 days (06/01/2018); 4/10 L UE, 2/10 R UE at most for the past 7 days  (06/15/2018); 2/10 R UE, 3-4/10 L UE at worst (06/30/2018)    Time  8    Period  Weeks    Status  Achieved    Target Date  06/24/18      PT LONG TERM GOAL #3   Title  Patient will have a decrease in back pain to 4/10 or less at worst to promote ability to ambulate, perform standing tasks.     Baseline  8/10 at worst for the past month (04/26/2018); 4/10 at most for the past 7 days (06/01/2018); 2/10 back pain at most (06/30/2018)    Time  8    Period  Weeks    Status  Achieved    Target Date  06/24/18      PT LONG TERM GOAL #4   Title  Patient will have a decrease in bilateral LE paresthesia to to 3/10 or less at worst to promote ability to perform standing tasks as well as ambulate.     Baseline  Paresthesia: L LE 8/10, R LE 6/10 at worst for the past month (04/26/2018); 4/10 L LE, 3/10 R LE at most for the past 7 days (06/01/2017);  4/10 L LE, 3/10 R LE at most for the past 7 days (06/15/2018); 2/10 B LE symptoms at most (06/30/2018)    Time  8    Period  Weeks    Status  Achieved    Target Date  06/24/18      PT LONG  TERM GOAL #5   Title  Patient will improve TUG time to 12 seconds or less to promote functional mobility     Baseline  13.2 seconds average without AD (04/26/2018); 9.8 seconds average without AD (06/01/2018)    Time  8    Period  Weeks    Status  Achieved    Target Date  06/24/18      PT LONG TERM GOAL #6   Title  Patient will improve his lower leg FOTO score by at least 10 points as a demonstration of improved function.     Baseline  30 (04/26/2018); 45 (06/01/2017)    Time  8    Period  Weeks    Status  Achieved    Target Date  06/24/18      PT LONG TERM GOAL #7   Title  Pt will improve his DGI score to 23/24 or more as a demonstration of improved balance.     Baseline  21/24 (06/30/2018)    Time  3    Period  Weeks    Status  New    Target Date  07/22/18            Plan - 07/13/18 0813    Clinical Impression Statement  Decreased L UE numbness after treatment to  decrease tension to muscles around L radial nerve.  Decreased back pain and decreased low back weakness sensation following exercises to improve posture, and trunk strengthening. Pt will benefit from continued skilled physical therapy services to continue to decrease pain, symptoms, improve strength, balance and function.     Rehab Potential  Fair    Clinical Impairments Affecting Rehab Potential  (-) medical history, weakness, B UE and LE paresthesia, age, neck and back pain. (+) decreased bilateral LE paresthesia during evaluation, motivated.     PT Frequency  2x / week    PT Duration  6 weeks    PT Treatment/Interventions  Aquatic Therapy;Traction;Gait training;Therapeutic activities;Therapeutic exercise;Balance training;Neuromuscular re-education;Patient/family education;Manual techniques;Dry needling   traction if appropriate   PT Next Visit Plan  posture, gentle lateral shift correction, gentle back extension, scapular strengthening, thoracic extension, glute and trunk strengthening     Consulted and Agree with Plan of Care  Patient       Patient will benefit from skilled therapeutic intervention in order to improve the following deficits and impairments:  Pain, Improper body mechanics, Postural dysfunction, Decreased strength, Difficulty walking, Decreased balance, Abnormal gait  Visit Diagnosis: Chronic bilateral low back pain, unspecified whether sciatica present  Radiculopathy, lumbar region  Muscle weakness (generalized)  Difficulty in walking, not elsewhere classified  Cervicalgia  Radiculopathy, cervicothoracic region     Problem List Patient Active Problem List   Diagnosis Date Noted  . OSA (obstructive sleep apnea) 10/26/2017  . Coronary artery disease involving native coronary artery of native heart with unstable angina pectoris ([place]) 08/11/2017  . Coronary artery disease   . 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 D-dimer, elevated 04/03/2017  . S/P TURP 01/05/2017   . Right lower quadrant abdominal pain   . Acute kidney injury ([place]) 09/09/2016  . Appendicitis   . Onychomycosis 12/20/2015  . Chest pain 12/03/2015  . Atrial fibrillation ([place]) 12/12/2013  . Arthritis 11/05/2013  . Hyperglycemia 11/05/2013  . Hyperglycemia, unspecified 11/05/2013  . Hyperlipidemia 11/05/2013  . Hyperlipidemia, unspecified 11/05/2013  . Hypothyroidism 11/05/2013  . Hypothyroidism, unspecified 11/05/2013  . Inflammatory arthritis 11/05/2013  . OA (osteoarthritis) 11/05/2013  . Osteoarthritis 11/05/2013  . Male erectile  dysfunction 06/18/2012  . Urge incontinence 06/18/2012  . 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 Complete heart block ([place]) 12/09/2011  . Hypertension 12/09/2011  . Rheumatoid arthritis(714.0) 12/09/2011  . Rheumatoid arthritis ([place]) 12/09/2011    Joneen Boers PT, DPT   07/13/2018, 12:40 PM  [facility] [address], [place], [address] Phone: [phone]   Fax:  [phone]  [name] MRN: [id number] Date of Birth: 17-Jan-1941

## 2018-07-15 ENCOUNTER — Ambulatory Visit: Payer: Medicare Other

## 2018-07-15 DIAGNOSIS — R262 Difficulty in walking, not elsewhere classified: Secondary | ICD-10-CM

## 2018-07-15 DIAGNOSIS — M5416 Radiculopathy, lumbar region: Secondary | ICD-10-CM

## 2018-07-15 DIAGNOSIS — M542 Cervicalgia: Secondary | ICD-10-CM

## 2018-07-15 DIAGNOSIS — G8929 Other chronic pain: Secondary | ICD-10-CM

## 2018-07-15 DIAGNOSIS — M5413 Radiculopathy, cervicothoracic region: Secondary | ICD-10-CM

## 2018-07-15 DIAGNOSIS — M545 Low back pain: Principal | ICD-10-CM

## 2018-07-15 DIAGNOSIS — M6281 Muscle weakness (generalized): Secondary | ICD-10-CM
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
## 2018-07-15 NOTE — Patient Instructions (Addendum)
Standing R lateral shift correction 10x5 seconds for 2 sets   MedbridgeAccess Code: [email or web address]  Shoulder Internal Rotation with Resistance  Green band L UE 10x3 with 5 second holds

## 2018-07-20 ENCOUNTER — Ambulatory Visit: Payer: Medicare Other

## 2018-07-22 ENCOUNTER — Ambulatory Visit: Payer: Medicare Other

## 2018-07-24 ENCOUNTER — Other Ambulatory Visit: Payer: Self-pay | Admitting: Adult Health

## 2018-07-27 ENCOUNTER — Ambulatory Visit: Payer: Medicare Other | Attending: Neurosurgery

## 2018-07-27 DIAGNOSIS — M542 Cervicalgia: Secondary | ICD-10-CM | POA: Insufficient documentation

## 2018-07-27 DIAGNOSIS — M5416 Radiculopathy, lumbar region: Secondary | ICD-10-CM | POA: Diagnosis present

## 2018-07-27 DIAGNOSIS — M5413 Radiculopathy, cervicothoracic region: Secondary | ICD-10-CM | POA: Diagnosis present

## 2018-07-27 DIAGNOSIS — M545 Low back pain: Secondary | ICD-10-CM | POA: Diagnosis present

## 2018-07-27 DIAGNOSIS — R262 Difficulty in walking, not elsewhere classified: Secondary | ICD-10-CM | POA: Insufficient documentation

## 2018-07-27 DIAGNOSIS — G8929 Other chronic pain: Secondary | ICD-10-CM | POA: Diagnosis present

## 2018-07-27 DIAGNOSIS — M6281 Muscle weakness (generalized): Secondary | ICD-10-CM | POA: Insufficient documentation

## 2018-07-27 NOTE — Therapy (Signed)
[facility] [address], [place], [address] Phone: [phone]   Fax:  [phone]  Physical Therapy Treatment Recertification (for today's appointment only) And Discharge Summary   Patient Details  [name] MRN: [id number] Date of Birth: 11-11-1940 Referring Provider (PT): Malen Gauze, MD   Encounter Date: 07/27/2018  PT End of Session - 07/27/18 1435    Visit Number  17    Number of Visits  23    Date for PT Re-Evaluation  07/27/18    Authorization Type  9    Authorization Time Period  of 10 progress report    PT Start Time  1435    PT Stop Time  1515    PT Time Calculation (min)  40 min    Activity Tolerance  Patient tolerated treatment well    Behavior During Therapy  [facility] for tasks assessed/performed       Past Medical History:  Diagnosis Date  . Appendicitis   . Atrial fibrillation ([place]) 12/12/2013  . BPH (benign prostatic hypertrophy)   . CAD (coronary artery disease) 12/2015   Cath by Dr [name] reveals distal and small vessel CAD.  Medical therapy advised.  . Chest pain 12/03/2015  . CHF (congestive heart failure) ([place])   . 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 Coronary artery disease   . Coronary artery disease involving native coronary artery of native heart with unstable angina pectoris ([place]) 08/11/2017  . History of blood transfusion 1968   "probably; related to getting wounded in [place]"  . History of kidney stones   . Hyperglycemia 11/05/2013  . Hyperlipidemia 11/05/2013  . Hypertension   . Hypothyroidism   . Hypothyroidism, unspecified 11/05/2013  . Inflammatory arthritis 11/05/2013  . Onychomycosis 12/20/2015  . OSA (obstructive sleep apnea) 10/26/2017    AHI of 8.1/h overall and 6.2/h during REM sleep.  AHI was 20/h while supine.  Oxygen saturations dropped to 87%.  Now on CPAP at 7cm H2O  . OSA on CPAP   . 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 Rheumatoid arthritis ([place])    "hands" (08/11/2017)  . Type II diabetes mellitus ([place])     Past Surgical History:  Procedure Laterality Date  . BACK SURGERY    . CARDIAC CATHETERIZATION N/A 12/28/2015   Procedure: Left Heart Cath and Coronary Angiography;  Surgeon: Belva Crome, MD;  Location: [place] [facility];  Service: Cardiovascular;  Laterality: N/A;  . CATARACT EXTRACTION W/ INTRAOCULAR LENS  IMPLANT, BILATERAL Bilateral   . CORONARY ANGIOPLASTY WITH STENT PLACEMENT  08/11/2017   "2 stents"  . CORONARY STENT INTERVENTION N/A 08/11/2017   Procedure: CORONARY STENT INTERVENTION;  Surgeon: Troy Sine, MD;  Location: [place] [facility];  Service: Cardiovascular;  Laterality: N/A;  . CYSTOSCOPY W/ STONE MANIPULATION    . INGUINAL HERNIA REPAIR Left   . INSERT / REPLACE / REMOVE PACEMAKER  12/09/2011   SJM Accent DR RF implanted by DR Allred for complete heart block and syncope  . JOINT REPLACEMENT    . LAPAROSCOPIC CHOLECYSTECTOMY    . LITHOTRIPSY    . LUMBAR DISC SURGERY     "removed arthritis and spurs"  . PERMANENT PACEMAKER INSERTION N/A 12/09/2011   Procedure: PERMANENT PACEMAKER INSERTION;  Surgeon: [name], MD;  Location: [facility];  Service: Cardiovascular;  Laterality: N/A;  . REPLACEMENT TOTAL KNEE Right   .  RIGHT/LEFT HEART CATH AND CORONARY ANGIOGRAPHY N/A 08/11/2017   Procedure: RIGHT/LEFT HEART CATH AND CORONARY ANGIOGRAPHY;  Surgeon: Larey Dresser, MD;  Location: [place] [facility];  Service: Cardiovascular;  Laterality: N/A;  . TRANSURETHRAL RESECTION OF PROSTATE  2017/2018    There were no vitals filed for this visit.  Subjective Assessment - 07/27/18 1436    Subjective  Neck, back, legs, arms are better. Has a flash heat down his legs every once in a while. Does not seem to bother him from walking.  Feels like he can graduate PT today.     Pertinent History  Myelopathy. Symptoms began about 3.5 months ago  starting with his L hand and fingers and radiated up his L arm (along the ulnar and C8/T1 dermatome).  Pain stopped at his L shoulder.  Pt also states having pain across his shoulders,  Pt then had similar symptoms at same areas in his R UE. Pain then went down his B inner thighs L greater than the R down to his great toe.  Pt also states having low back pain.  Neck bothers pt more than low back.  Has a little bit of bladder incontinence since last year and does not know if Dr. Lacinda Axon knows about it. Has a little bit of a drip when he stands up at times.  Has bowel control.  Has not had any imaging for his low back in the last few years.   Pt is R hand dominant.  Denies numbness at S3-5 dermatomes.   Pt states he thinks Dr. Lacinda Axon knows about his B inner thigh numbness.    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       Patient Stated Goals  Be more stable and walk better.     Currently in Pain?  No/denies    Pain Score  0-No pain    Pain Onset  More than a month ago         [facility] PT Assessment - 07/27/18 1446      Dynamic Gait Index   Level Surface  Normal    Change in Gait Speed  Normal    Gait with Horizontal Head Turns  Normal    Gait with Vertical Head Turns  Normal    Gait and Pivot Turn  Normal    Step Over Obstacle  Normal    Step Around Obstacles  Normal    Steps  Normal    Total Score  24    DGI comment:  low fall risk.                            PT Education - 07/27/18 1830    Education Details  ther-ex    Person(s) Educated  Patient    Methods  Explanation;Demonstration;Tactile cues;Verbal cues    Comprehension  Returned demonstration;Verbalized understanding       Objectives  Therapeutic exercise  Directed patient with gait with normal gait speed, with changes in speed, 180 degree pivot turn, with R and L cervical rotation position, with cervical flexion and extension  position, stepping around obstacles, stepping over an obstacle, ascending and descending 4 regular steps with UE assist   Standing R lateral shift correction 10x5 seconds for 2 sets comfortably.  Reviewed and given as part of his HEP. Pt demonstrated and verbalized understanding.  Improved exercise technique, movement at target joints, use of target muscles after min to mod verbal, visual, tactile cues.      Manual therapy  Seated STM L infraspinatus, triceps, supinator, rhomboid, forearm flexor muscles to help decreased L hand paresthesia  Decreased L 4th and 5th digit paresthesia   Pt demonstrates significant improvement in neck, B UE, low back, and B LE symptoms as well as improved balance and function since initial evaluation. Pt has achieved all goals and demonstrates independence and consistency with his HEP. Skilled physical therapy services discharged with pt continuing progress with his exercises at home after  recert for today's appointment.       PT Short Term Goals - 06/30/18 1603      PT SHORT TERM GOAL #1   Title  Patient will be independent with his HEP to decrease pain, improve strength and function.     Baseline  Pt demonstrates independence with his HEP (06/30/2018)    Time  3    Period  Weeks    Status  Achieved    Target Date  05/20/18        PT Long Term Goals - 07/27/18 1833      PT LONG TERM GOAL #1   Title  Patient will have a decrease in neck pain to 4/10 or less at worst to promote ability to perform sitting tasks.    Baseline  9/10 neck pain at worst for the past month (04/26/2018); 5/10 at most for the past 7 days (06/01/2018); 3/10 at worst for the past 7 days (06/15/2018); 2-3/10 at worst (06/30/2018)    Time  8    Period  Weeks    Status  Achieved      PT LONG TERM GOAL #2   Title  Patient will have a decrease in B UE paresthesia to 4/10 or less at worst to promote ability to perform functional tasks wiht his UE.     Baseline  Paresthesia: L UE  9/10, R UE 8/10 at worst for the past month (04/26/2018); 7/10 L UE, 4/10 R UE at most for the past 7 days (06/01/2018); 4/10 L UE, 2/10 R UE at most for the past 7 days (06/15/2018); 2/10 R UE, 3-4/10 L UE at worst (06/30/2018)    Time  8    Period  Weeks    Status  Achieved      PT LONG TERM GOAL #3   Title  Patient will have a decrease in back pain to 4/10 or less at worst to promote ability to ambulate, perform standing tasks.     Baseline  8/10 at worst for the past month (04/26/2018); 4/10 at most for the past 7 days (06/01/2018); 2/10 back pain at most (06/30/2018)    Time  8    Period  Weeks    Status  Achieved      PT LONG TERM GOAL #4   Title  Patient will have a decrease in bilateral LE paresthesia to to 3/10 or less at worst to promote ability to perform standing tasks as well as ambulate.     Baseline  Paresthesia: L LE 8/10, R LE 6/10 at worst for the past month (04/26/2018); 4/10 L LE, 3/10 R LE at most for the past 7 days (06/01/2017);  4/10 L LE, 3/10 R LE at most for the past 7 days (06/15/2018); 2/10 B LE symptoms at most (06/30/2018)    Time  8    Period  Weeks  Status  Achieved      PT LONG TERM GOAL #5   Title  Patient will improve TUG time to 12 seconds or less to promote functional mobility     Baseline  13.2 seconds average without AD (04/26/2018); 9.8 seconds average without AD (06/01/2018)    Time  8    Period  Weeks    Status  Achieved      PT LONG TERM GOAL #6   Title  Patient will improve his lower leg FOTO score by at least 10 points as a demonstration of improved function.     Baseline  30 (04/26/2018); 45 (06/01/2017)    Time  8    Period  Weeks    Status  Achieved      PT LONG TERM GOAL #7   Title  Pt will improve his DGI score to 23/24 or more as a demonstration of improved balance.     Baseline  21/24 (06/30/2018); 24/24 (07/27/2018)    Time  3    Period  Weeks    Status  Achieved    Target Date  07/22/18            Plan - 07/27/18 1831    Clinical  Impression Statement  Pt demonstrates significant improvement in neck, B UE, low back, and B LE symptoms as well as improved balance and function since initial evaluation. Pt has achieved all goals and demonstrates independence and consistency with his HEP. Skilled physical therapy services discharged with pt continuing progress with his exercises at home after  recert for today's appointment.      Personal Factors and Comorbidities  Age    Stability/Clinical Decision Making  Stable/Uncomplicated   patient acheived all goals   Clinical Decision Making  Low    Clinical Presentation due to:  Patient has acheived all PT goals. Improved balance, significant decrease in pain and radiating symptoms.     Rehab Potential  Excellent    Clinical Impairments Affecting Rehab Potential  (-) medical history, weakness, B UE and LE paresthesia, age, neck and back pain. (+) decreased bilateral LE paresthesia during evaluation, motivated.     PT Frequency  One time visit    PT Treatment/Interventions  Therapeutic activities;Therapeutic exercise;Balance training;Neuromuscular re-education;Patient/family education;Manual techniques   traction if appropriate   PT Next Visit Plan  continue progress with HEP    Consulted and Agree with Plan of Care  Patient       Patient will benefit from skilled therapeutic intervention in order to improve the following deficits and impairments:  Improper body mechanics, Postural dysfunction, Decreased strength, Difficulty walking, Decreased balance, Abnormal gait, Pain  Visit Diagnosis: Chronic bilateral low back pain, unspecified whether sciatica present - Plan: PT plan of care cert/re-cert  Radiculopathy, lumbar region - Plan: PT plan of care cert/re-cert  Muscle weakness (generalized) - Plan: PT plan of care cert/re-cert  Difficulty in walking, not elsewhere classified - Plan: PT plan of care cert/re-cert  Cervicalgia - Plan: PT plan of care cert/re-cert  Radiculopathy,  cervicothoracic region - Plan: PT plan of care cert/re-cert     Problem List Patient Active Problem List   Diagnosis Date Noted  . OSA (obstructive sleep apnea) 10/26/2017  . Coronary artery disease involving native coronary artery of native heart with unstable angina pectoris ([place]) 08/11/2017  . Coronary artery disease   . 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 D-dimer, elevated 04/03/2017  . S/P TURP 01/05/2017  . Right lower quadrant abdominal pain   .  Acute kidney injury ([place]) 09/09/2016  . Appendicitis   . Onychomycosis 12/20/2015  . Chest pain 12/03/2015  . Atrial fibrillation ([place]) 12/12/2013  . Arthritis 11/05/2013  . Hyperglycemia 11/05/2013  . Hyperglycemia, unspecified 11/05/2013  . Hyperlipidemia 11/05/2013  . Hyperlipidemia, unspecified 11/05/2013  . Hypothyroidism 11/05/2013  . Hypothyroidism, unspecified 11/05/2013  . Inflammatory arthritis 11/05/2013  . OA (osteoarthritis) 11/05/2013  . Osteoarthritis 11/05/2013  . Male erectile dysfunction 06/18/2012  . Urge incontinence 06/18/2012  . 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 Complete heart block ([place]) 12/09/2011  . Hypertension 12/09/2011  . Rheumatoid arthritis(714.[place]) 12/09/2011  . Rheumatoid arthritis ([place]) 12/09/2011    Thank you for your referral.  Joneen Boers PT, DPT   07/27/2018, 6:46 PM  [facility] [address] [place], [address] Phone: [phone]   Fax:  [phone]  Name: KARSEN NAKANISHI MRN: [id number] Date of Birth: [birth year]

## 2018-07-27 NOTE — Patient Instructions (Signed)
Standing R lateral shift correction 10x5 seconds for 2 sets comfortably.  Reviewed and given as part of his HEP. Pt demonstrated and verbalized understanding.

## 2018-08-06 ENCOUNTER — Other Ambulatory Visit: Payer: Medicare Other

## 2018-08-11 ENCOUNTER — Other Ambulatory Visit: Payer: Self-pay

## 2018-08-11 ENCOUNTER — Ambulatory Visit (INDEPENDENT_AMBULATORY_CARE_PROVIDER_SITE_OTHER): Payer: Medicare Other | Admitting: *Deleted

## 2018-08-11 DIAGNOSIS — I442 Atrioventricular block, complete: Secondary | ICD-10-CM

## 2018-08-11 LAB — CUP PACEART REMOTE DEVICE CHECK
Battery Remaining Longevity: 41 mo
Battery Remaining Percentage: 35 %
Battery Voltage: 2.83 V
Brady Statistic AP VP Percent: 95 %
Brady Statistic AP VS Percent: 1 %
Brady Statistic AS VP Percent: 5 %
Brady Statistic AS VS Percent: 1 %
Brady Statistic RA Percent Paced: 95 %
Brady Statistic RV Percent Paced: 99 %
Date Time Interrogation Session: 20200318073106
Implantable Lead Implant Date: 20130716
Implantable Lead Implant Date: 20130716
Implantable Lead Location: 753859
Implantable Lead Location: 753860
Implantable Lead Model: 1948
Implantable Pulse Generator Implant Date: 20130716
Lead Channel Impedance Value: 340 Ohm
Lead Channel Impedance Value: 640 Ohm
Lead Channel Pacing Threshold Amplitude: 0.5 V
Lead Channel Pacing Threshold Amplitude: 0.625 V
Lead Channel Pacing Threshold Pulse Width: 0.5 ms
Lead Channel Pacing Threshold Pulse Width: 0.5 ms
Lead Channel Sensing Intrinsic Amplitude: 12 mV
Lead Channel Sensing Intrinsic Amplitude: 2.1 mV
Lead Channel Setting Pacing Amplitude: 0.75 V
Lead Channel Setting Pacing Amplitude: 1.625
Lead Channel Setting Pacing Pulse Width: 0.5 ms
Lead Channel Setting Sensing Sensitivity: 4 mV
Pulse Gen Model: 2210
Pulse Gen Serial Number: 7356500

## 2018-08-13 ENCOUNTER — Inpatient Hospital Stay: Admission: RE | Admit: 2018-08-13 | Payer: Medicare Other | Source: Ambulatory Visit

## 2018-08-19 ENCOUNTER — Encounter: Payer: Self-pay | Admitting: Cardiology

## 2018-08-19 ENCOUNTER — Ambulatory Visit: Payer: Medicare Other | Admitting: Internal Medicine

## 2018-08-19 NOTE — Progress Notes (Signed)
Remote pacemaker transmission.   

## 2018-08-23 ENCOUNTER — Other Ambulatory Visit: Payer: Self-pay | Admitting: Physician Assistant

## 2018-08-24 ENCOUNTER — Encounter: Payer: Self-pay | Admitting: Internal Medicine

## 2018-08-24 NOTE — Telephone Encounter (Signed)
Error

## 2018-10-07 IMAGING — CT CT HEAD W/O CM
3 of 4 series · 14 of 47 positions shown, 16 images · non-contrast
Comparison: 12/09/2011 head CT

CLINICAL DATA: 76-year-old male with dizziness and ataxia for 2
months.

EXAM:
CT HEAD WITHOUT CONTRAST
TECHNIQUE: Contiguous axial images were obtained from the base of the skull
through the vertex without intravenous contrast.

[Series 2: head · axial · 0.37mm/px · z∈[-606,-488]mm · 8 of 28 slices shown, 10 images (1 of 3)]
[im 2/28  brain]
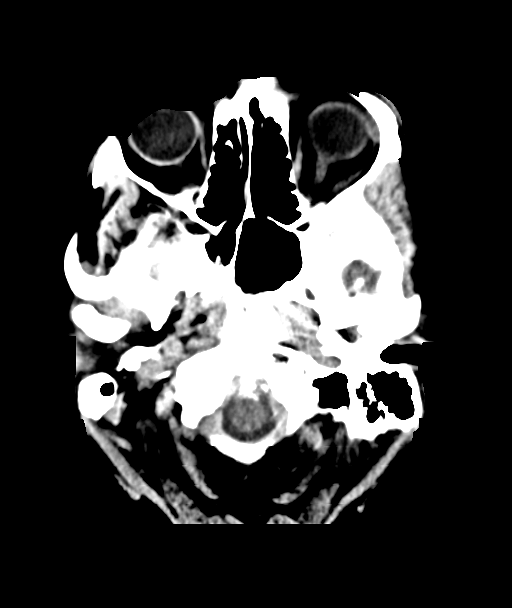
[im 2/28  bone]
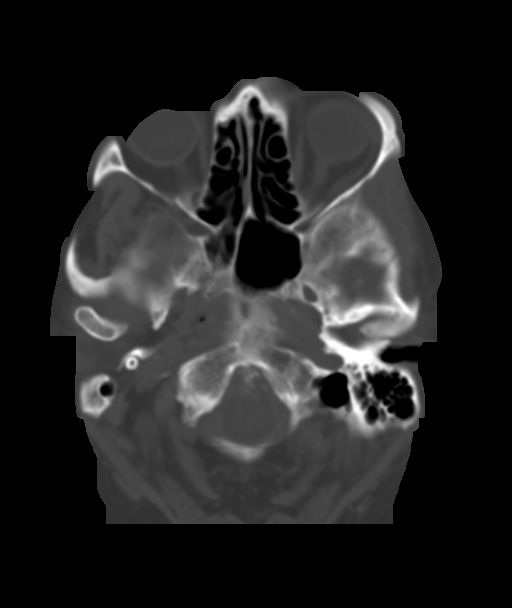
[im 6/28  brain]
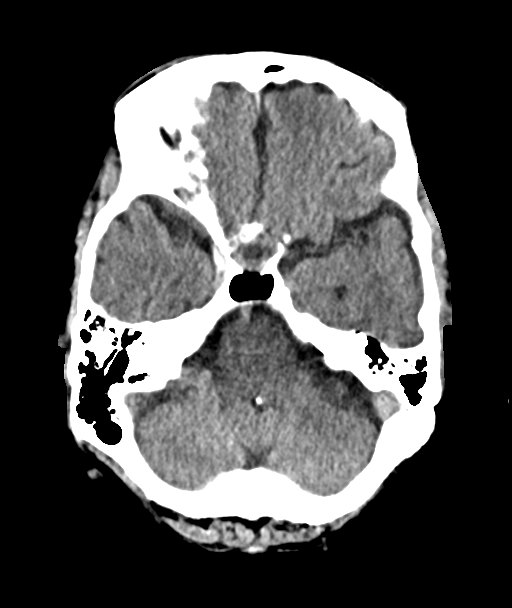
[im 10/28  brain]
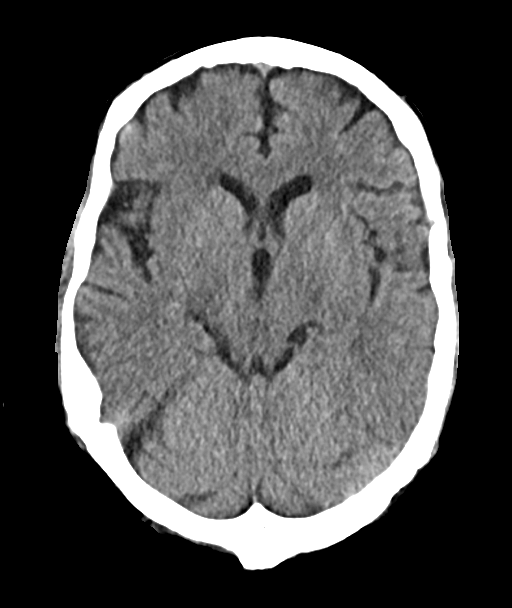
[im 12/28  brain]
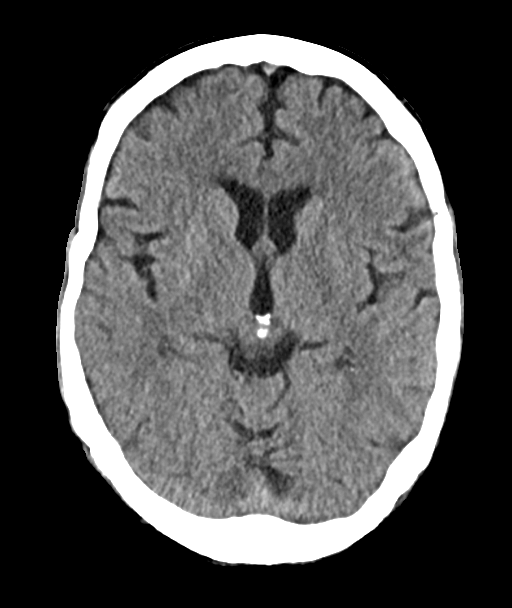
[im 16/28  brain]
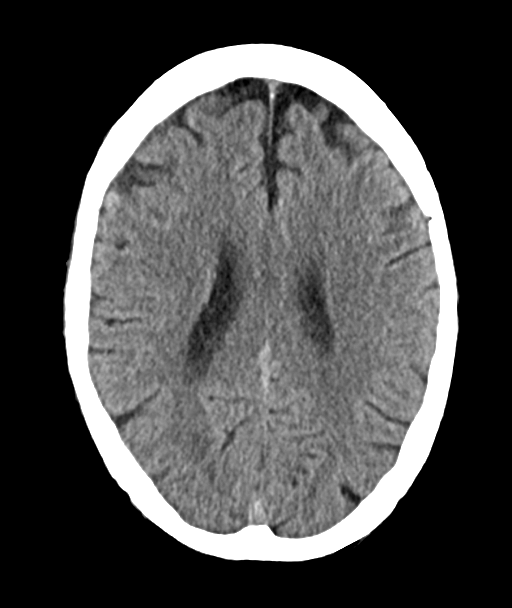
[im 16/28  bone]
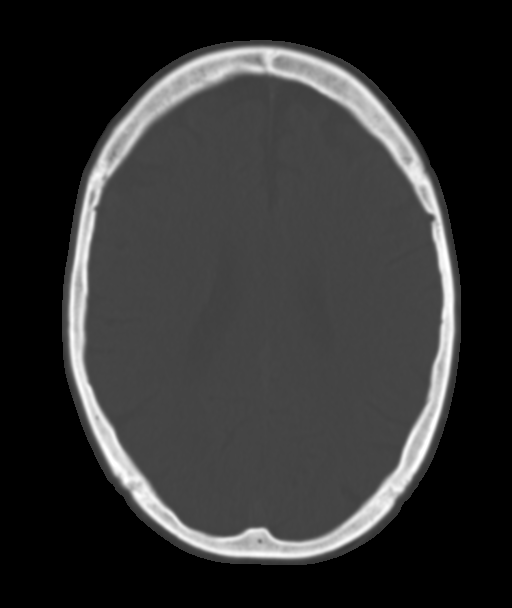
[im 18/28  brain]
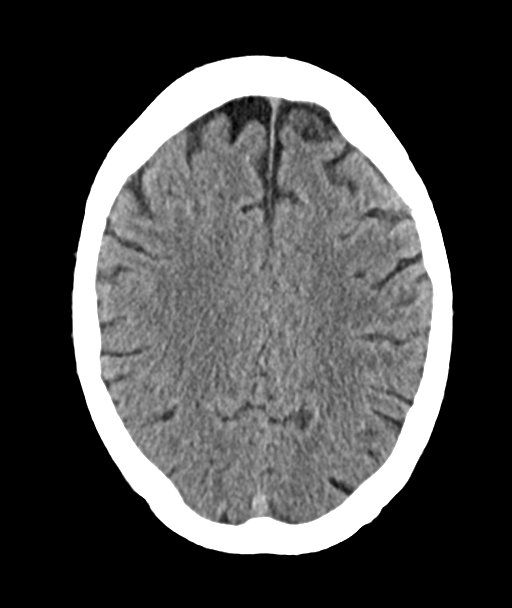
[im 22/28  brain]
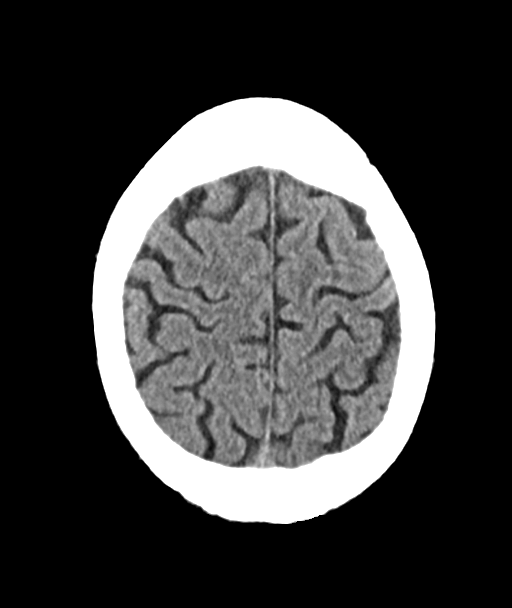
[im 26/28  brain]
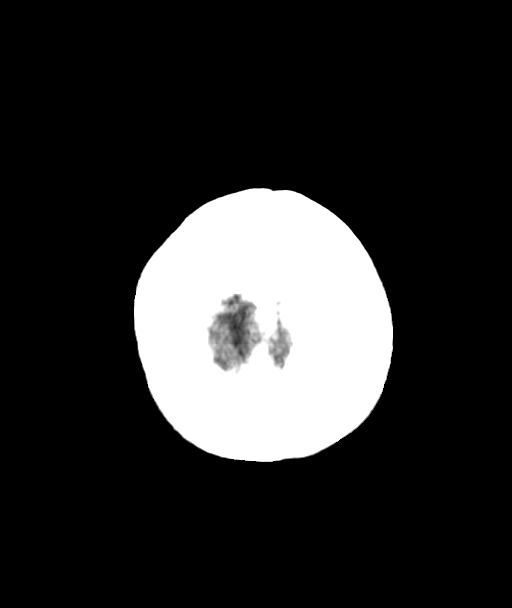

[Series 6: head · coronal · 0.28mm/px · 3 of 74 slices shown (2 of 3)]
[im 25/74  brain]
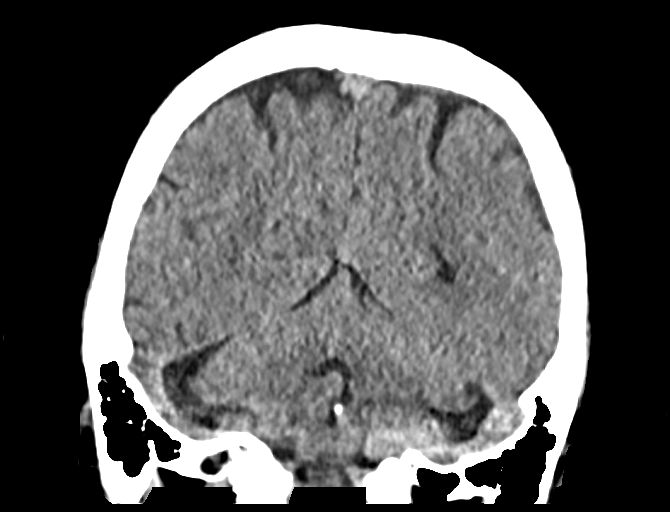
[im 33/74  brain]
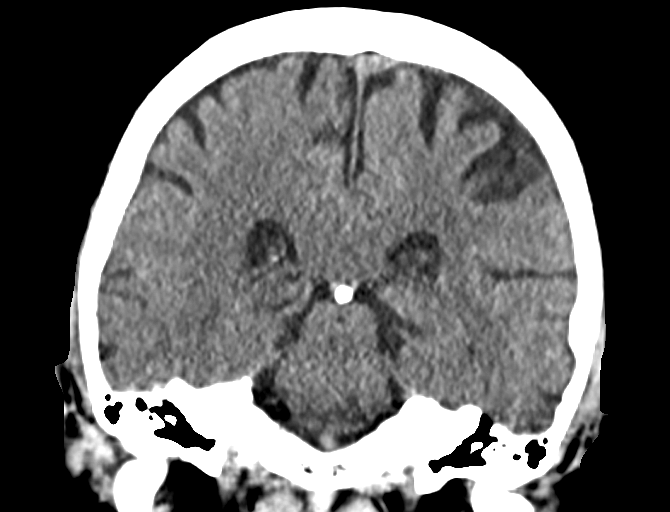
[im 41/74  brain]
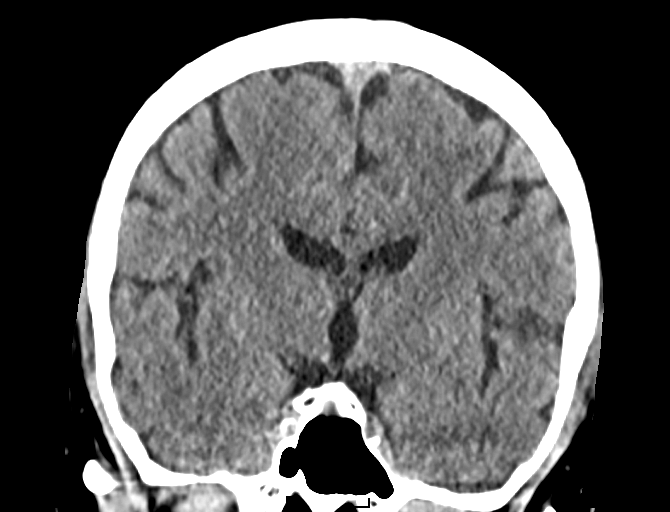

[Series 8: head · sagittal · 0.28mm/px · 3 of 63 slices shown (3 of 3)]
[im 21/63  brain]
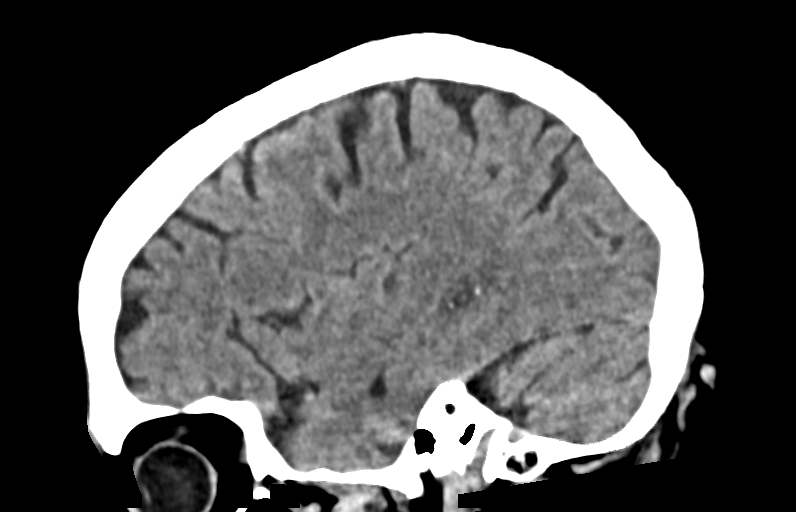
[im 32/63  brain]
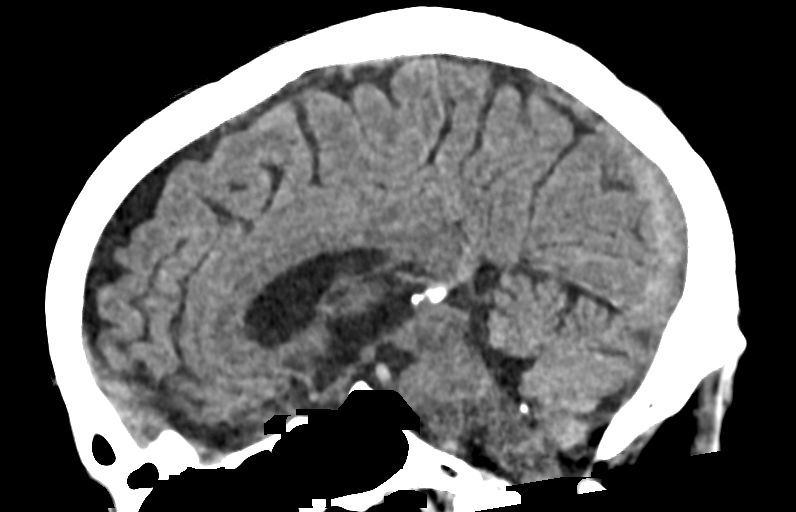
[im 42/63  brain]
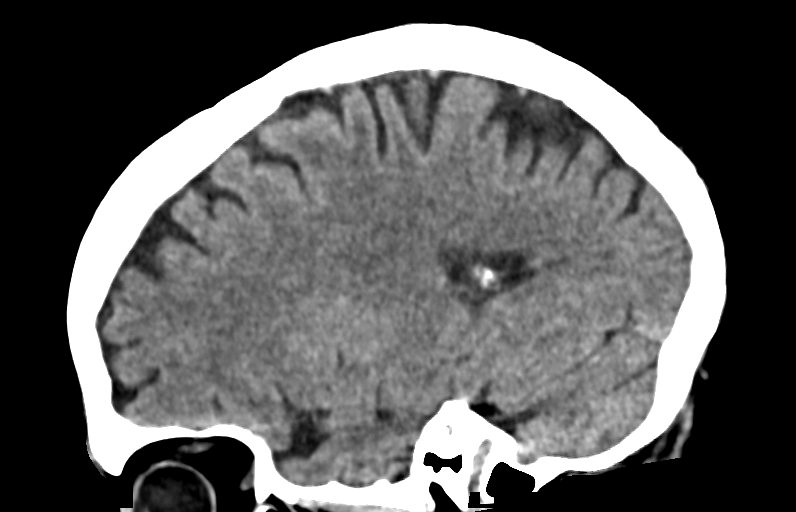

[14 of 47 positions shown; findings below may reference images not displayed]

FINDINGS: Brain: No evidence of acute infarction, hemorrhage, hydrocephalus,
extra-axial collection or mass lesion/mass effect.

Mild atrophy and chronic small-vessel white matter ischemic changes
again noted.

Vascular: Atherosclerotic calcifications identified.

Skull: Normal. Negative for fracture or focal lesion.

Sinuses/Orbits: No acute finding.

Other: None.
IMPRESSION: 1. No evidence of acute intracranial abnormality
2. Mild atrophy and chronic small-vessel white matter ischemic
changes.

## 2018-11-01 ENCOUNTER — Telehealth: Payer: Self-pay

## 2018-11-01 NOTE — Telephone Encounter (Signed)
   Primary Cardiologist: [name], MD  Chart reviewed as part of pre-operative protocol coverage. Patient was contacted 11/01/2018 in reference to pre-operative risk assessment for pending surgery as outlined below.  Cameron [name] was last seen on 06/17/18 by [name], PA-C.  Since that day, Cameron [name] has done well w/o any cardiac symptoms. No exertional CP or dyspnea.  Therefore, based on ACC/AHA guidelines, the patient would be at acceptable risk for the planned procedure without further cardiovascular testing.    He is now > 1 yr post PCI + DES, done 07/2017. I will route to Dr. Claiborne Billings to see if ok to hold ASA and Plavix x 7 days prior to procedure.   Lyda Jester, PA-C 11/01/2018, 9:32 AM

## 2018-11-01 NOTE — Telephone Encounter (Signed)
Dr. Lacinda Axon from [facility] would like a written permission for Brailon Don to have a myelogram done by him. Your office has given the ok for him to do this, but [facility] needs this writing.  Fax this letter to Dr. Jonathon Jordan office [phone].  Then hopefully they will call us to make appointment.  We would like to do this, but do not plan to do surgery. He has been dong massage therapy, with acupuncture, and some chiropractic. Doing better. Our phone is [phone] Thanks Lavone Orn and [name]

## 2018-11-02 NOTE — Telephone Encounter (Signed)
Primary Cardiologist:Thomas Claiborne Billings, MD  Chart reviewed as part of pre-operative protocol coverage. Pre-op clearance already addressed by colleagues in earlier phone notes. To summarize recommendations:  -Chart reviewed as part of pre-operative protocol coverage by Lyda Jester, PA. Patient was contacted 11/01/2018 in reference to pre-operative risk assessment for pending surgery as outlined below.  Cameron [name] was last seen on 06/17/18 by [name], PA-C.  Since that day, Cameron [name] has done well w/o any cardiac symptoms. No exertional CP or dyspnea.  Therefore, based on ACC/AHA guidelines, the patient would be at acceptable risk for the planned procedure without further cardiovascular testing.    He is now > 1 yr post PCI + DES, done 07/2017. Per Dr. Claiborne Billings it is OK to hold aspirin/Plavix for 7 days.   Will route this bundled recommendation to requesting provider via Epic fax function. Please call with questions.  Daune Perch, NP 11/02/2018, 5:10 PM

## 2018-11-02 NOTE — Telephone Encounter (Signed)
Agree; ok for hold asa/plavix for 7 days

## 2018-11-05 ENCOUNTER — Other Ambulatory Visit: Payer: Self-pay | Admitting: Neurosurgery

## 2018-11-05 DIAGNOSIS — G959 Disease of spinal cord, unspecified: Secondary | ICD-10-CM

## 2018-11-05 NOTE — Progress Notes (Signed)
Spoke with patients wife about procedure coming up. Instructed to stop plavix per cardiology for 5 days. Last dose being Saturday. Wife voiced understanding. Gave her a run down of how the day would go. Concerned about him being pacemaker dependent. Instructed her to call cardiology and discuss with them.

## 2018-11-10 ENCOUNTER — Ambulatory Visit (INDEPENDENT_AMBULATORY_CARE_PROVIDER_SITE_OTHER): Payer: Medicare Other | Admitting: *Deleted

## 2018-11-10 ENCOUNTER — Other Ambulatory Visit
Admission: RE | Admit: 2018-11-10 | Discharge: 2018-11-10 | Disposition: A | Discharge status: Home / Self Care | Payer: Medicare Other | Attending: Neurosurgery | Admitting: Neurosurgery

## 2018-11-10 DIAGNOSIS — M4712 Other spondylosis with myelopathy, cervical region: Secondary | ICD-10-CM | POA: Diagnosis present

## 2018-11-10 DIAGNOSIS — I442 Atrioventricular block, complete: Secondary | ICD-10-CM | POA: Diagnosis not present

## 2018-11-10 LAB — CBC WITH DIFFERENTIAL/PLATELET
Abs Immature Granulocytes: 0.02 10*3/uL (ref 0.00–0.07)
Basophils Absolute: 0.1 10*3/uL (ref 0.0–0.1)
Basophils Relative: 1 %
Eosinophils Absolute: 0.3 10*3/uL (ref 0.0–0.5)
Eosinophils Relative: 4 %
HCT: 39.7 % (ref 39.0–52.0)
Hemoglobin: 13 g/dL (ref 13.0–17.0)
Immature Granulocytes: 0 %
Lymphocytes Relative: 33 %
Lymphs Abs: 2.7 10*3/uL (ref 0.7–4.0)
MCH: 30.9 pg (ref 26.0–34.0)
MCHC: 32.7 g/dL (ref 30.0–36.0)
MCV: 94.3 fL (ref 80.0–100.0)
Monocytes Absolute: 0.7 10*3/uL (ref 0.1–1.0)
Monocytes Relative: 8 %
Neutro Abs: 4.3 10*3/uL (ref 1.7–7.7)
Neutrophils Relative %: 54 %
Platelets: 182 10*3/uL (ref 150–400)
RBC: 4.21 MIL/uL — ABNORMAL LOW (ref 4.22–5.81)
RDW: 13.5 % (ref 11.5–15.5)
WBC: 8.2 10*3/uL (ref 4.0–10.5)
nRBC: 0 % (ref 0.0–0.2)

## 2018-11-10 LAB — CUP PACEART REMOTE DEVICE CHECK
Battery Remaining Longevity: 34 mo
Battery Remaining Percentage: 29 %
Battery Voltage: 2.81 V
Brady Statistic AP VP Percent: 96 %
Brady Statistic AP VS Percent: 1 %
Brady Statistic AS VP Percent: 3.8 %
Brady Statistic AS VS Percent: 1 %
Brady Statistic RA Percent Paced: 96 %
Brady Statistic RV Percent Paced: 99 %
Date Time Interrogation Session: 20200617075955
Implantable Lead Implant Date: 20130716
Implantable Lead Implant Date: 20130716
Implantable Lead Location: 753859
Implantable Lead Location: 753860
Implantable Lead Model: 1948
Implantable Pulse Generator Implant Date: 20130716
Lead Channel Impedance Value: 330 Ohm
Lead Channel Impedance Value: 580 Ohm
Lead Channel Pacing Threshold Amplitude: 0.5 V
Lead Channel Pacing Threshold Amplitude: 0.625 V
Lead Channel Pacing Threshold Pulse Width: 0.5 ms
Lead Channel Pacing Threshold Pulse Width: 0.5 ms
Lead Channel Sensing Intrinsic Amplitude: 12 mV
Lead Channel Sensing Intrinsic Amplitude: 2 mV
Lead Channel Setting Pacing Amplitude: 0.75 V
Lead Channel Setting Pacing Amplitude: 1.625
Lead Channel Setting Pacing Pulse Width: 0.5 ms
Lead Channel Setting Sensing Sensitivity: 4 mV
Pulse Gen Model: 2210
Pulse Gen Serial Number: 7356500

## 2018-11-10 LAB — PROTIME-INR
INR: 1 (ref 0.8–1.2)
Prothrombin Time: 13.4 seconds (ref 11.4–15.2)

## 2018-11-10 LAB — APTT: aPTT: 37 seconds — ABNORMAL HIGH (ref 24–36)

## 2018-11-11 ENCOUNTER — Other Ambulatory Visit: Payer: Self-pay

## 2018-11-11 ENCOUNTER — Ambulatory Visit
Admission: RE | Admit: 2018-11-11 | Discharge: 2018-11-11 | Disposition: A | Discharge status: Home / Self Care | Payer: Medicare Other | Source: Ambulatory Visit | Attending: Neurosurgery | Admitting: Neurosurgery

## 2018-11-11 DIAGNOSIS — G959 Disease of spinal cord, unspecified: Secondary | ICD-10-CM | POA: Diagnosis present

## 2018-11-11 DIAGNOSIS — D1809 Hemangioma of other sites: Secondary | ICD-10-CM | POA: Diagnosis not present

## 2018-11-11 DIAGNOSIS — M4802 Spinal stenosis, cervical region: Secondary | ICD-10-CM | POA: Diagnosis not present

## 2018-11-11 LAB — GLUCOSE, CAPILLARY: Glucose-Capillary: 125 mg/dL — ABNORMAL HIGH (ref 70–99)

## 2018-11-11 MED ORDER — IOPAMIDOL (ISOVUE-300) INJECTION 61%
10.0000 mL | Freq: Once | INTRAVENOUS | Status: AC | PRN
  Administered 2018-11-11: 10 mL

## 2018-11-11 MED ORDER — LIDOCAINE HCL (PF) 1 % IJ SOLN
5.0000 mL | Freq: Once | INTRAMUSCULAR | Status: AC
  Administered 2018-11-11: 5 mL
  Filled 2018-11-11: qty 5

## 2018-11-11 NOTE — OR Nursing (Signed)
Bandaid at lower back dry and intact without swelling noted.

## 2018-11-11 NOTE — Discharge Instructions (Signed)
Myelogram, Care After Refer to this sheet in the next few weeks. These instructions provide you with information about caring for yourself after your procedure. Your health care provider may also give you more specific instructions. Your treatment has been planned according to current medical practices, but problems sometimes occur. Call your health care provider if you have any problems or questions after your procedure. What can I expect after the procedure? After the procedure, it is common to have:  Soreness at your injection site.  A mild headache. Follow these instructions at home:  Drink enough fluid to keep your urine clear or pale yellow. This will help flush out the dye (contrast material) from your spine.  Rest as told by your health care provider. Lie flat with your head slightly raised (elevated) to reduce the risk of headache.  Do not bend, lift, or do any strenuous activity for 24-48 hours or as told by your health care provider.  Take over-the-counter and prescription medicines only as told by your health care provider.  Take care of and remove your bandage (dressing) as told by your health care provider.  Bathe or shower as told by your health care provider. Contact a health care provider if:  You have a fever.  You have a headache that lasts longer than 24 hours.  You feel nauseous or vomit.  You have a stiff neck or numbness in your legs.  You are unable to urinate or have a bowel movement.  You develop a rash, itching, or sneezing. Get help right away if:  You have new symptoms or your symptoms get worse.  You have a seizure.  You have trouble breathing. This information is not intended to replace advice given to you by your health care provider. Make sure you discuss any questions you have with your health care provider. Document Released: 06/08/2015 Document Revised: 10/18/2015 Document Reviewed: 02/22/2015 Elsevier Interactive Patient Education  2019  [employer].

## 2018-11-11 NOTE — OR Nursing (Signed)
1400 pm Patient denies pain or headache. Dr. Posey Pronto has cleared him for discharge to home.

## 2018-11-19 ENCOUNTER — Telehealth: Payer: Self-pay | Admitting: Internal Medicine

## 2018-11-19 DIAGNOSIS — J849 Interstitial pulmonary disease, unspecified: Secondary | ICD-10-CM

## 2018-11-19 DIAGNOSIS — R0609 Other forms of dyspnea: Secondary | ICD-10-CM

## 2018-11-19 NOTE — Telephone Encounter (Signed)
Left message for Cameron [name] to call back.

## 2018-11-19 NOTE — Telephone Encounter (Signed)
Spoke with Vickie. Advised her that of the process of getting patients scheduled for PFTs and CTs scans. She verbalized understanding.   Will print out the PFT order for [place].   PCCs, can we go ahead and get patient scheduled for his CT scan?

## 2018-11-19 NOTE — Telephone Encounter (Signed)
One was ordered on 11/03/17 for high resolution CT scan. I will place another in case this one has expired.

## 2018-11-19 NOTE — Telephone Encounter (Signed)
Hey I dont see a ct order in here

## 2018-11-22 ENCOUNTER — Encounter: Payer: Self-pay | Admitting: Cardiology

## 2018-11-22 NOTE — Telephone Encounter (Signed)
I have scheduled CT for pt at [facility].  Scheduled for 7/30 at 2:15, arrive 2:00, no prep.  Left vm for pt to call me back for appt info.

## 2018-11-22 NOTE — Progress Notes (Signed)
Remote pacemaker transmission.   

## 2018-11-23 NOTE — Telephone Encounter (Signed)
Spoke to pt's wife & gave her appt info.  Nothing further needed.

## 2018-12-20 ENCOUNTER — Telehealth: Payer: Self-pay | Admitting: Internal Medicine

## 2018-12-21 NOTE — Telephone Encounter (Signed)
Patient called back - schedule pft on 01/17/2019-pr

## 2018-12-23 ENCOUNTER — Inpatient Hospital Stay: Admission: RE | Admit: 2018-12-23 | Payer: Medicare Other | Source: Ambulatory Visit

## 2018-12-29 ENCOUNTER — Inpatient Hospital Stay: Admission: RE | Admit: 2018-12-29 | Payer: Medicare Other | Source: Ambulatory Visit

## 2019-01-06 ENCOUNTER — Other Ambulatory Visit: Payer: Self-pay | Admitting: Internal Medicine

## 2019-01-12 ENCOUNTER — Other Ambulatory Visit: Payer: Self-pay

## 2019-01-12 ENCOUNTER — Ambulatory Visit (INDEPENDENT_AMBULATORY_CARE_PROVIDER_SITE_OTHER)
Admission: RE | Admit: 2019-01-12 | Discharge: 2019-01-12 | Disposition: A | Discharge status: Home / Self Care | Payer: Medicare Other | Source: Ambulatory Visit | Attending: Internal Medicine | Admitting: Internal Medicine

## 2019-01-12 DIAGNOSIS — J849 Interstitial pulmonary disease, unspecified: Secondary | ICD-10-CM

## 2019-01-14 ENCOUNTER — Other Ambulatory Visit (HOSPITAL_COMMUNITY)
Admission: RE | Admit: 2019-01-14 | Discharge: 2019-01-14 | Disposition: A | Discharge status: Home / Self Care | Payer: Medicare Other | Source: Ambulatory Visit | Attending: Pulmonary Disease | Admitting: Pulmonary Disease

## 2019-01-14 DIAGNOSIS — Z01812 Encounter for preprocedural laboratory examination: Secondary | ICD-10-CM | POA: Insufficient documentation

## 2019-01-14 DIAGNOSIS — Z20828 Contact with and (suspected) exposure to other viral communicable diseases: Secondary | ICD-10-CM | POA: Insufficient documentation

## 2019-01-14 LAB — SARS CORONAVIRUS 2 (TAT 6-24 HRS): SARS Coronavirus 2: NEGATIVE

## 2019-01-16 NOTE — Progress Notes (Signed)
SARS-CoV-2 negative.  This is good news.  Lauraine Rinne, NP

## 2019-01-17 ENCOUNTER — Ambulatory Visit (INDEPENDENT_AMBULATORY_CARE_PROVIDER_SITE_OTHER): Payer: Medicare Other | Admitting: Internal Medicine

## 2019-01-17 ENCOUNTER — Encounter: Payer: Self-pay | Admitting: Pulmonary Disease

## 2019-01-17 ENCOUNTER — Other Ambulatory Visit: Payer: Self-pay

## 2019-01-17 ENCOUNTER — Ambulatory Visit (INDEPENDENT_AMBULATORY_CARE_PROVIDER_SITE_OTHER): Payer: Medicare Other | Admitting: Pulmonary Disease

## 2019-01-17 VITALS — BP 116/68 | HR 60 | Temp 97.8°F | Ht 67.0 in | Wt 187.0 lb

## 2019-01-17 DIAGNOSIS — J849 Interstitial pulmonary disease, unspecified: Secondary | ICD-10-CM

## 2019-01-17 DIAGNOSIS — M0600 Rheumatoid arthritis without rheumatoid factor, unspecified site: Secondary | ICD-10-CM | POA: Diagnosis not present

## 2019-01-17 DIAGNOSIS — R0609 Other forms of dyspnea: Secondary | ICD-10-CM | POA: Diagnosis not present

## 2019-01-17 DIAGNOSIS — I2729 Other secondary pulmonary hypertension: Secondary | ICD-10-CM | POA: Diagnosis not present

## 2019-01-17 DIAGNOSIS — I272 Pulmonary hypertension, unspecified: Secondary | ICD-10-CM | POA: Insufficient documentation

## 2019-01-17 LAB — PULMONARY FUNCTION TEST
DL/VA % pred: 104 %
DL/VA: 4.15 ml/min/mmHg/L
DLCO unc % pred: 96 %
DLCO unc: 21.58 ml/min/mmHg
FEF 25-75 Pre: 2.86 L/sec
FEF2575-%Pred-Pre: 159 %
FEV1-%Pred-Pre: 107 %
FEV1-Pre: 2.78 L
FEV1FVC-%Pred-Pre: 113 %
FEV6-%Pred-Pre: 100 %
FEV6-Pre: 3.39 L
FEV6FVC-%Pred-Pre: 106 %
FVC-%Pred-Pre: 94 %
FVC-Pre: 3.41 L
Pre FEV1/FVC ratio: 82 %
Pre FEV6/FVC Ratio: 99 %

## 2019-01-17 NOTE — Assessment & Plan Note (Addendum)
Plan: Follow-up with our office in 6 months We will continue to monitor your shortness of breath clinically I do not believe you doing additional blood work or work-up at this time We will plan on repeating pulmonary function testing in 1 year Walk today in office   Discussed with patient today: If symptoms worsen, or shortness of breath increases may need to consider moving up CT imaging or pulmonary function testing.  Also may need to further evaluate rheumatoid arthritis if patient starts to have worsening joint pain.  Currently this time he is asymptomatic and has no concerns or issues.

## 2019-01-17 NOTE — Patient Instructions (Signed)
You were seen today by Lauraine Rinne, NP  for:   1. ILD (interstitial lung disease) ([place])  We will continue to monitor you clinically Continue to remain active Practice good hand hygiene Received flu vaccine in fall/2020 Follow-up with our office if your shortness of breath worsens We will plan on repeating breathing test in 1 year Follow-up with [name] in 6 months or sooner if symptoms worsen  2. Other secondary pulmonary hypertension ([place])  Continue to remain physically fit Notify our office if you start retaining fluid in your lower extremities Notify our office if your weight start trending up or shortness of breath worsens  Follow Up:    Return in about 6 months (around 07/20/2019), or if symptoms worsen or fail to improve, for Follow up with Dr. Purnell Shoemaker.   Please do your part to reduce the spread of COVID-19:      Reduce your risk of any infection  and COVID19 by using the similar precautions used for avoiding the common cold or flu:  Marland Kitchen Wash your hands often with soap and warm water for at least 20 seconds.  If soap and water are not readily available, use an alcohol-based hand sanitizer with at least 60% alcohol.  . If coughing or sneezing, cover your mouth and nose by coughing or sneezing into the elbow areas of your shirt or coat, into a tissue or into your sleeve (not your hands). Langley Gauss A MASK when in public  . Avoid shaking hands with others and consider head nods or verbal greetings only. . Avoid touching your eyes, nose, or mouth with unwashed hands.  . Avoid close contact with people who are sick. . Avoid places or events with large numbers of people in one location, like concerts or sporting events. . If you have some symptoms but not all symptoms, continue to monitor at home and seek medical attention if your symptoms worsen. . If you are having a medical emergency, call 911.   [place] /  e-Visit: [email or web address]         [facility]: [facility]: [phone]                   [facility]: [phone]     It is flu season:   >>> Best ways to protect herself from the flu: Receive the yearly flu vaccine, practice good hand hygiene washing with soap and also using hand sanitizer when available, eat a nutritious meals, get adequate rest, hydrate appropriately   Please contact the office if your symptoms worsen or you have concerns that you are not improving.   Thank you for choosing [facility] for your healthcare, and for allowing [place] to partner with you on your healthcare journey. I am thankful to be able to provide care to you today.   Wyn Quaker FNP-C

## 2019-01-17 NOTE — Progress Notes (Signed)
Spirometry and Dlco done today.

## 2019-01-17 NOTE — Progress Notes (Signed)
_0  ID: Cameron [name], male    DOB: 05-03-1941, 78 y.o.   MRN: [id number]  Chief Complaint  Patient presents with  . Follow-up    Per patient, follow up after CT and PFT. States his breathing has been ok since last visit.     Referring provider: [name], MD  HPI:  78 year old male former smoker followed in our office for ILD most recent CT chest in August/2020 indeterminate for UIP, previous CT imaging favoring NSIP, known rheumatoid arthritis managed by [facility] clinic.  PMH: Hypertension, rheumatoid arthritis, hypothyroidism, osteoarthritis, Smoker/ Smoking History: Former smoker.  Quit 1974.  5 pack years. Maintenance: None Pt of: Dr. 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 Jefm Bryant in [facility]  01/17/2019  - Visit   78 year old male former smoker followed in our office for known interstitial lung disease.  Patient following up with our office today to review most recent CT imaging as well as after completing a spirometry with DLCO in our office today.  Those results are listed below:  01/12/2019-CT chest high-res- mild pulmonary fibrosis in a pattern with apical to basal gradient featuring irregular peripheral interstitial opacity and mild tubular bronchiectasis without clear bronchiolectasis or honeycombing, no significant air trapping or expiratory phase imaging, findings remain indeterminate for UIP, differential considerations include both UIP and NSIP  01/17/2019-spirometry with DLCO- FVC 3.41 (94% predicted), ratio 82, FEV1 2.78 (107% predicted), DLCO 21.58 (96% predicted)  Overall, patient reports that he has been doing quite well.  He actually feels that his shortness of breath is continued to improve since last office visit.  This is most likely attributed to the stent that was placed prior to last office visit where patients all immediate improvement with his breathing as  well as improvement in lower extremity swelling.  Patient reports that his lower extremity swelling has been insignificant since last office visit.    Tests:   01/14/2019-SARS-CoV-2-negative  01/12/2019-CT chest high-res- mild pulmonary fibrosis in a pattern with apical to basal gradient featuring irregular peripheral interstitial opacity and mild tubular bronchiectasis without clear bronchiolectasis or honeycombing, no significant air trapping or expiratory phase imaging, findings remain indeterminate for UIP, differential considerations include both UIP and NSIP  07/22/2017-CT chest high-res-mild subpleural reticular densities in the posterior lateral aspects of both lower lobes are suspicious for mild fibrotic interstitial lung disease such as NSIP, early mild UIP is not excluded, enlarged pulmonary arteries indicative of pulmonary arterial hypertension  11/03/2017-pulmonary function test- spirometry with DLCO-FVC 3.59 (98% predicted), ratio 83, FEV1 2.98 (114% predicted), DLCO 20.97 (74% predicted) 09/17/2017-echocardiogram- LV ejection fraction 55 to 60%, mild LVH, grade 1 diastolic dysfunction, PAP pressure 41  07/30/2017- connective tissue work-up-   anti-DNA antibody-negative ACE-6 ANCA proteinase 3-negative Hypersensitivity pneumonitis-negative Aldolase 3.3, negative RNP antibody-negative Anti-myeloperoxidase (MPO) ABS-negative ANCA screen-negative Anti-scleroderma antibody-negative Sjogren's syndrome-negative CCP-less than 16 Rheumatoid factor-less than 10 Sed rate-19 ANA-negative Cardiac panel-CK-MB-negative Relative index 5.3 >>>invalid when CK-MB is less than 100, negative Total CK-45  11/03/2017-pulmonary function test- FVC 3.59 (98% predicted), ratio 83, FEV1 2.98 (114% predicted), DLCO 20.97 (74% predicted)  01/17/2019-spirometry with DLCO- FVC 3.41 (94% predicted), ratio 82, FEV1 2.78 (107% predicted), DLCO 21.58 (96% predicted)  SIX MIN WALK 01/17/2019 11/03/2017  08/04/2017 07/10/2017  Supplimental Oxygen during Test? (L/min) No No No No  Tech Comments: - Pt walked at a very fast pace completing all required laps. Denied any complaints. - Pt walked  at a fast pace completing all required laps. Only complaint was mild SOB.     FENO:  No results found for: NITRICOXIDE  PFT: PFT Results Latest Ref Rng & Units 01/17/2019 11/03/2017 05/28/2017  FVC-Pre L 3.41 3.59 3.29  FVC-Predicted Pre % 94 98 90  FVC-Post L - - 3.28  FVC-Predicted Post % - - 89  Pre FEV1/FVC % % 82 83 87  Post FEV1/FCV % % - - 83  FEV1-Pre L 2.78 2.98 2.85  FEV1-Predicted Pre % 107 114 109  FEV1-Post L - - 2.72  DLCO UNC% % 96 74 71  DLCO COR %Predicted % 104 89 92  TLC L - - 5.47  TLC % Predicted % - - 84  RV % Predicted % - - 80    Imaging: Ct Chest High Resolution  Result Date: 01/12/2019 CLINICAL DATA:  Interstitial lung disease, several year history of shortness of breath with exertion EXAM: CT CHEST WITHOUT CONTRAST TECHNIQUE: Multidetector CT imaging of the chest was performed following the standard protocol without intravenous contrast. High resolution imaging of the lungs, as well as inspiratory and expiratory imaging, was performed. COMPARISON:  07/22/2017, 04/03/2017 FINDINGS: Cardiovascular: Mild aortic atherosclerosis. Normal heart size. Extensive 3 vessel coronary artery calcifications and/or stents. Left chest multi lead pacer defibrillator. No pericardial effusion. Mediastinum/Nodes: No enlarged mediastinal, hilar, or axillary lymph nodes. Thyroid gland, trachea, and esophagus demonstrate no significant findings. Lungs/Pleura: There is mild pulmonary fibrosis in a pattern with apically to basal gradient featuring irregular peripheral interstitial opacity and mild tubular bronchiectasis without clear bronchiolectasis or honeycombing. There is no significant air trapping on expiratory phase imaging. No pleural effusion or pneumothorax. Upper Abdomen: No acute abnormality.  Partially imaged left superior pole nephrolithiasis. Musculoskeletal: No chest wall mass or suspicious bone lesions identified. IMPRESSION: 1. 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 There is no significant air trapping on expiratory phase imaging. Findings are not significantly changed compared to prior examinations and remain in an "indeterminate for UIP" pattern by ATS pulmonary fibrosis criteria, differential considerations including both UIP and NSIP. 2.  Coronary artery disease and aortic atherosclerosis. 3.  Left nephrolithiasis. Electronically Signed   By: Eddie Candle M.D.   On: 01/12/2019 15:08      Specialty Problems      Pulmonary Problems   OSA (obstructive sleep apnea)     AHI of 8.1/h overall and 6.2/h during REM sleep.  AHI was 20/h while supine.  Oxygen saturations dropped to 87%.  Now on CPAP at 7cm H2O      ILD (interstitial lung disease) ([place])    01/12/2019-CT chest high-res- mild pulmonary fibrosis in a pattern with apical to basal gradient featuring irregular peripheral interstitial opacity and mild tubular bronchiectasis without clear bronchiolectasis or honeycombing, no significant air trapping or expiratory phase imaging, findings remain indeterminate for UIP, differential considerations include both UIP and NSIP  07/22/2017-CT chest high-res-mild subpleural reticular densities in the posterior lateral aspects of both lower lobes are suspicious for mild fibrotic interstitial lung disease such as NSIP, early mild UIP is not excluded, enlarged pulmonary arteries indicative of pulmonary arterial hypertension  11/03/2017-pulmonary function test- spirometry with DLCO-FVC 3.59 (98% predicted), ratio 83, FEV1 2.98 (114% predicted), DLCO 20.97 (74% predicted) 09/17/2017-echocardiogram- LV ejection fraction 55 to 60%,  mild LVH, grade 1 diastolic dysfunction, PAP pressure 41  07/30/2017- connective tissue work-up-  anti-DNA antibody-negative ACE-6 ANCA proteinase 3-negative Hypersensitivity pneumonitis-negative Aldolase 3.3, negative RNP antibody-negative Anti-myeloperoxidase (MPO) ABS-negative ANCA screen-negative Anti-scleroderma antibody-negative Sjogren's syndrome-negative CCP-less than 16 Rheumatoid factor-less than 10 Sed rate-19 ANA-negative Cardiac panel-CK-MB-negative Relative index 5.3 >>>invalid when CK-MB is less than 100, negative Total CK-45  11/03/2017-pulmonary function test- FVC 3.59 (98% predicted), ratio 83, FEV1 2.98 (114% predicted), DLCO 20.97 (74% predicted)  01/17/2019-spirometry with DLCO- FVC 3.41 (94% predicted), ratio 82, FEV1 2.78 (107% predicted), DLCO 21.58 (96% predicted)         Allergies  Allergen Reactions  . Celecoxib Rash    Skin rash     Immunization History  Administered Date(s) Administered  . Influenza, High Dose Seasonal PF 03/26/2017  . Influenza-Unspecified 05/03/2015, 02/13/2016, 03/26/2016, 03/19/2017  . Pneumococcal Polysaccharide-23 12/10/2011  . Pneumococcal-Unspecified 03/26/2016, 02/04/2018  . Tdap 01/17/2016, 02/04/2018   Patient to receive flu vaccine from pharmacy later on in fall/2020  Past Medical History:  Diagnosis Date  . Appendicitis   . Atrial fibrillation ([place]) 12/12/2013  . BPH (benign prostatic hypertrophy)   . CAD (coronary artery disease) 12/2015   Cath by Dr [name] reveals distal and small vessel CAD.  Medical therapy advised.  . Chest pain 12/03/2015  . CHF (congestive heart failure) ([place])   . 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 Coronary artery disease   . Coronary artery disease involving native coronary artery of native heart with unstable angina pectoris ([place]) 08/11/2017  . History of blood transfusion 1968   "probably; related to getting wounded in [place]"  . History of kidney stones   .  Hyperglycemia 11/05/2013  . Hyperlipidemia 11/05/2013  . Hypertension   . Hypothyroidism   . Hypothyroidism, unspecified 11/05/2013  . Inflammatory arthritis 11/05/2013  . Onychomycosis 12/20/2015  . OSA (obstructive sleep apnea) 10/26/2017    AHI of 8.1/h overall and 6.2/h during REM sleep.  AHI was 20/h while supine.  Oxygen saturations dropped to 87%.  Now on CPAP at 7cm H2O  . OSA on CPAP   . 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 Rheumatoid arthritis ([place])    "hands" (08/11/2017)  . Type II diabetes mellitus (HCC)     Tobacco History: Social History   Tobacco Use  Smoking Status Former Smoker  . Packs/day: 1.00  . Years: 5.00  . Pack years: 5.00  . Types: Cigarettes  . Quit date: 07/10/1972  . Years since quitting: 48.5  Smokeless Tobacco Former Systems developer  . Types: Chew  . Quit date: 1975   Counseling given: Not Answered   Continue to not smoke  Outpatient Encounter Medications as of 01/17/2019  Medication Sig  . aspirin EC 81 MG tablet Take 81 mg by mouth daily.  Marland Kitchen BLACK PEPPER-TURMERIC PO Take 1 capsule by mouth 2 (two) times daily.  . clopidogrel (PLAVIX) 75 MG tablet TAKE ONE TABLET EACH MORNING WITH BREAKFAST  . Coenzyme Q10 (CO Q 10) 100 MG CAPS Take 100 mg by mouth daily.  . Cyanocobalamin (B-12) 5000 MCG CAPS Take 1 capsule by mouth daily.  Marland Kitchen glimepiride (AMARYL) 2 MG tablet Take 2 mg by mouth daily.  Marland Kitchen leflunomide (ARAVA) 10 MG tablet Take 1 tablet by mouth daily.  Marland Kitchen lisinopril (PRINIVIL,ZESTRIL) 10 MG tablet TAKE ONE TABLET EVERY DAY  . magnesium oxide (MAG-OX) 400 MG tablet Take 1 tablet by mouth daily.  . nebivolol (BYSTOLIC) 2.5 MG tablet Take 1 tablet (2.5 mg total) by mouth daily.  Marland Kitchen  nitroGLYCERIN (NITROSTAT) 0.4 MG SL tablet DISSOLVE 1 TABLET UNDER THE TONGUE EVERY 5 MINUTES AS NEEDED FOR CHEST PAIN.  Marland Kitchen Omega-3 Fatty Acids (OMEGA 3 PO) Take 1 tablet 2 (two) times daily by mouth.  . tamsulosin (FLOMAX) 0.4 MG CAPS  capsule Take 1 capsule (0.4 mg total) by mouth daily.  Marland Kitchen thyroid (ARMOUR) 90 MG tablet Take 90 mg by mouth every morning.   . trospium (SANCTURA) 20 MG tablet Take 1 tablet by mouth daily.  . TURMERIC CURCUMIN PO Take 1 capsule by mouth daily.  . pravastatin (PRAVACHOL) 20 MG tablet Take 1 tablet (20 mg total) by mouth every evening.   Facility-Administered Encounter Medications as of 01/17/2019  Medication  . ondansetron (ZOFRAN) 4 mg in sodium chloride 0.9 % 50 mL IVPB     Review of Systems  Review of Systems  Constitutional: Negative for activity change, chills, fatigue, fever and unexpected weight change.  HENT: Negative for postnasal drip, rhinorrhea, sinus pressure, sinus pain and sore throat.   Eyes: Negative.   Respiratory: Negative for cough, shortness of breath and wheezing.   Cardiovascular: Negative for chest pain, palpitations and leg swelling.  Gastrointestinal: Negative for diarrhea, nausea and vomiting.  Endocrine: Negative.   Genitourinary: Negative.   Musculoskeletal: Negative.   Skin: Negative.   Neurological: Negative for dizziness and headaches.  Psychiatric/Behavioral: Negative.  Negative for dysphoric mood. The patient is not nervous/anxious.   All other systems reviewed and are negative.    Physical Exam  BP 116/68   Pulse 60   Temp 97.8 F (36.6 C) (Oral)   Ht _0  (1.702 m)   Wt 187 lb (84.8 kg)   SpO2 99%   BMI 29.29 kg/m   Wt Readings from Last 5 Encounters:  01/17/19 187 lb (84.8 kg)  11/11/18 180 lb (81.6 kg)  06/17/18 189 lb 9.6 oz (86 kg)  03/11/18 182 lb 6.4 oz (82.7 kg)  03/03/18 180 lb (81.6 kg)     Physical Exam Vitals signs and nursing note reviewed.  Constitutional:      General: He is not in acute distress.    Appearance: Normal appearance. He is obese.  HENT:     Head: Normocephalic and atraumatic.     Right Ear: Hearing, tympanic membrane, ear canal and external ear normal.     Left Ear: Hearing, tympanic membrane,  ear canal and external ear normal.     Nose: Nose normal. No mucosal edema, congestion or rhinorrhea.     Right Turbinates: Not enlarged.     Left Turbinates: Not enlarged.     Mouth/Throat:     Mouth: Mucous membranes are moist.     Pharynx: Oropharynx is clear. No oropharyngeal exudate.  Eyes:     Pupils: Pupils are equal, round, and reactive to light.  Neck:     Musculoskeletal: Normal range of motion.  Cardiovascular:     Rate and Rhythm: Normal rate. Rhythm regularly irregular.     Pulses: Normal pulses.     Heart sounds: Normal heart sounds. No murmur.  Pulmonary:     Effort: Pulmonary effort is normal.     Breath sounds: Normal breath sounds. No decreased breath sounds, wheezing or rales.  Musculoskeletal:     Right lower leg: No edema.     Left lower leg: No edema.  Lymphadenopathy:     Cervical: No cervical adenopathy.  Skin:    General: Skin is warm and dry.     Capillary Refill: Capillary  refill takes less than 2 seconds.     Findings: No erythema or rash.  Neurological:     General: No focal deficit present.     Mental Status: He is alert and oriented to person, place, and time.     Motor: No weakness.     Coordination: Coordination normal.     Gait: Gait is intact. Gait normal.  Psychiatric:        Mood and Affect: Mood normal.        Behavior: Behavior normal. Behavior is cooperative.        Thought Content: Thought content normal.        Judgment: Judgment normal.      Lab Results:  CBC    Component Value Date/Time   WBC 8.2 11/10/2018 0810   RBC 4.21 (L) 11/10/2018 0810   HGB 13.0 11/10/2018 0810   HGB 13.7 04/02/2017 1204   HCT 39.7 11/10/2018 0810   HCT 40.5 04/02/2017 1204   PLT 182 11/10/2018 0810   PLT 209 04/02/2017 1204   MCV 94.3 11/10/2018 0810   MCV 92 04/02/2017 1204   MCV 96 12/09/2011 1046   MCH 30.9 11/10/2018 0810   MCHC 32.7 11/10/2018 0810   RDW 13.5 11/10/2018 0810   RDW 13.8 04/02/2017 1204   RDW 14.6 (H) 12/09/2011 1046    LYMPHSABS 2.7 11/10/2018 0810   MONOABS 0.7 11/10/2018 0810   EOSABS 0.3 11/10/2018 0810   BASOSABS 0.1 11/10/2018 0810    BMET    Component Value Date/Time   NA 143 04/02/2018 1002   NA 136 12/09/2011 1046   K 5.3 (H) 04/02/2018 1002   K 4.2 12/09/2011 1046   CL 106 04/02/2018 1002   CL 101 12/09/2011 1046   CO2 24 04/02/2018 1002   CO2 25 12/09/2011 1046   GLUCOSE 124 (H) 04/02/2018 1002   GLUCOSE 114 (H) 08/12/2017 0341   GLUCOSE 242 (H) 12/09/2011 1046   BUN 24 04/02/2018 1002   BUN 31 (H) 12/09/2011 1046   CREATININE 0.94 04/02/2018 1002   CREATININE 1.10 12/26/2015 0828   CALCIUM 9.8 04/02/2018 1002   CALCIUM 8.8 12/09/2011 1046   GFRNONAA 77 04/02/2018 1002   GFRNONAA 58 (L) 12/09/2011 1046   GFRAA 89 04/02/2018 1002   GFRAA >60 12/09/2011 1046    BNP No results found for: BNP  ProBNP    Component Value Date/Time   PROBNP 557 (H) 04/02/2017 1204      Assessment & Plan:   Other secondary pulmonary hypertension ([place]) Plan: Continue to monitor clinically Continue to weigh yourself at home Notify our office if you start retaining fluid or have worsening shortness of breath Follow-up with our office in 6 months  ILD (interstitial lung disease) ([place]) Plan: Follow-up with our office in 6 months We will continue to monitor your shortness of breath clinically I do not believe you doing additional blood work or work-up at this time We will plan on repeating pulmonary function testing in 1 year Walk today in office   Discussed with patient today: If symptoms worsen, or shortness of breath increases may need to consider moving up CT imaging or pulmonary function testing.  Also may need to further evaluate rheumatoid arthritis if patient starts to have worsening joint pain.  Currently this time he is asymptomatic and has no concerns or issues.  Rheumatoid arthritis ([place]) Plan: Continue follow-up with [facility] Dr. Jefm Bryant    Return in about 6  months (around 07/20/2019), or if  symptoms worsen or fail to improve, for Follow up with Dr. Purnell Shoemaker.   Lauraine Rinne, NP 01/17/2019   This appointment was 26 minutes long with over 50% of the time in direct face-to-face patient care, assessment, plan of care, and follow-up.

## 2019-01-17 NOTE — Assessment & Plan Note (Signed)
Plan: Continue follow-up with [facility] clinic Dr. Jefm Bryant

## 2019-01-17 NOTE — Assessment & Plan Note (Signed)
Plan: Continue to monitor clinically Continue to weigh yourself at home Notify our office if you start retaining fluid or have worsening shortness of breath Follow-up with our office in 6 months

## 2019-01-20 ENCOUNTER — Other Ambulatory Visit: Payer: Self-pay

## 2019-01-20 ENCOUNTER — Encounter: Payer: Self-pay | Admitting: Urology

## 2019-01-20 ENCOUNTER — Other Ambulatory Visit: Payer: Self-pay | Admitting: Physician Assistant

## 2019-01-20 ENCOUNTER — Ambulatory Visit (INDEPENDENT_AMBULATORY_CARE_PROVIDER_SITE_OTHER): Payer: Medicare Other | Admitting: Urology

## 2019-01-20 ENCOUNTER — Other Ambulatory Visit: Payer: Self-pay | Admitting: Adult Health

## 2019-01-20 VITALS — BP 127/70 | HR 98 | Ht 67.0 in | Wt 187.0 lb

## 2019-01-20 DIAGNOSIS — N471 Phimosis: Secondary | ICD-10-CM

## 2019-01-20 DIAGNOSIS — R350 Frequency of micturition: Secondary | ICD-10-CM

## 2019-01-20 DIAGNOSIS — N401 Enlarged prostate with lower urinary tract symptoms: Secondary | ICD-10-CM | POA: Diagnosis not present

## 2019-01-20 NOTE — Progress Notes (Signed)
01/20/2019 1:53 PM   Cameron [name] August 18, 1940 [id number]  Referring provider: [name], MD [facility] [place],  [place] [address]  Chief Complaint  Patient presents with  . Follow-up    Urologic history: 1.  BPH with detrusor overactivity -Long history of storage related voiding symptoms who did well for several years on anticholinergic medication. -Worsening voiding symptoms and 2017 and a daily study showed bladder outlet obstruction with detrusor overactivity. -TURP July 2018 at [facility] -Significant urge incontinence post TUR however at his last follow-up February 2019 this had significantly improved   HPI: 78 y.o. presents for annual follow-up.  Overall he states he is doing quite well.  He has occasional mild episodes of urge with urge incontinence.  He has nocturia x1.  IPSS was 5/2.  He denies dysuria or gross hematuria.  He remains on tamsulosin and a bedtime dose of trospium 20 mg.  He has seen a chiropractor who is placing "seeds" in the region of his tibial nerve and he thinks this is helping his lower urinary tract symptoms.  He has noted difficulty retracting his foreskin.  He does have type 2 diabetes.   PMH: Past Medical History:  Diagnosis Date  . Appendicitis   . Atrial fibrillation ([place]) 12/12/2013  . BPH (benign prostatic hypertrophy)   . CAD (coronary artery disease) 12/2015   Cath by Dr [name] reveals distal and small vessel CAD.  Medical therapy advised.  . Chest pain 12/03/2015  . CHF (congestive heart failure) ([place])   . 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 Coronary artery disease   . Coronary artery disease involving native coronary artery of native heart with unstable angina pectoris ([place]) 08/11/2017  . History of blood transfusion 1968   "probably; related to getting wounded in [place]"  . History of kidney stones   . Hyperglycemia 11/05/2013  . Hyperlipidemia 11/05/2013  . Hypertension   . Hypothyroidism    . Hypothyroidism, unspecified 11/05/2013  . Inflammatory arthritis 11/05/2013  . Onychomycosis 12/20/2015  . OSA (obstructive sleep apnea) 10/26/2017    AHI of 8.1/h overall and 6.2/h during REM sleep.  AHI was 20/h while supine.  Oxygen saturations dropped to 87%.  Now on CPAP at 7cm H2O  . OSA on CPAP   . 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 Rheumatoid arthritis ([place])    "hands" (08/11/2017)  . Type II diabetes mellitus ([place])     Surgical History: Past Surgical History:  Procedure Laterality Date  . BACK SURGERY    . CARDIAC CATHETERIZATION N/A 12/28/2015   Procedure: Left Heart Cath and Coronary Angiography;  Surgeon: Belva Crome, MD;  Location: [place] [facility];  Service: Cardiovascular;  Laterality: N/A;  . CATARACT EXTRACTION W/ INTRAOCULAR LENS  IMPLANT, BILATERAL Bilateral   . CORONARY ANGIOPLASTY WITH STENT PLACEMENT  08/11/2017   "2 stents"  . CORONARY STENT INTERVENTION N/A 08/11/2017   Procedure: CORONARY STENT INTERVENTION;  Surgeon: Troy Sine, MD;  Location: [place] [facility];  Service: Cardiovascular;  Laterality: N/A;  . CYSTOSCOPY W/ STONE MANIPULATION    . INGUINAL HERNIA REPAIR Left   . INSERT / REPLACE / REMOVE PACEMAKER  12/09/2011   SJM Accent DR RF implanted by DR Allred for complete heart block and syncope  . JOINT REPLACEMENT    . LAPAROSCOPIC CHOLECYSTECTOMY    . LITHOTRIPSY    . LUMBAR DISC SURGERY     "  removed arthritis and spurs"  . PERMANENT PACEMAKER INSERTION N/A 12/09/2011   Procedure: PERMANENT PACEMAKER INSERTION;  Surgeon: [name], MD;  Location: [facility] C[facility] LAB;  Service: Cardiovascular;  Laterality: N/A;  . REPLACEMENT TOTAL KNEE Right   . RIGHT/LEFT HEART CATH AND CORONARY ANGIOGRAPHY N/A 08/11/2017   Procedure: RIGHT/LEFT HEART CATH AND CORONARY ANGIOGRAPHY;  Surgeon: Larey Dresser, MD;  Location: [place] [facility];  Service: Cardiovascular;  Laterality: N/A;  . TRANSURETHRAL  RESECTION OF PROSTATE  2017/2018    Home Medications:  Allergies as of 01/20/2019      Reactions   Celecoxib Rash   Skin rash      Medication List       Accurate as of January 20, 2019  1:53 PM. If you have any questions, ask your nurse or doctor.        STOP taking these medications   B-12 5000 MCG Caps Stopped by: Abbie Sons, MD   BLACK PEPPER-TURMERIC PO Stopped by: Abbie Sons, MD   Co Q 10 100 MG Caps Stopped by: Abbie Sons, MD   glimepiride 2 MG tablet Commonly known as: AMARYL Stopped by: Abbie Sons, MD   tamsulosin 0.4 MG Caps capsule Commonly known as: FLOMAX Stopped by: Abbie Sons, MD   TURMERIC CURCUMIN PO Stopped by: Abbie Sons, MD     TAKE these medications   acetaminophen 500 MG tablet Commonly known as: TYLENOL Take 500 mg by mouth every 6 (six) hours as needed.   aspirin 81 MG chewable tablet Chew by mouth daily. What changed: Another medication with the same name was removed. Continue taking this medication, and follow the directions you see here. Changed by: Abbie Sons, MD   Bystolic 5 MG tablet Generic drug: nebivolol Take 5 mg by mouth daily. What changed: Another medication with the same name was removed. Continue taking this medication, and follow the directions you see here. Changed by: Abbie Sons, MD   clopidogrel 75 MG tablet Commonly known as: PLAVIX TAKE ONE TABLET EACH MORNING WITH BREAKFAST What changed: See the new instructions.   IRON PO Take 300 mg by mouth.   ketoconazole 2 % cream Commonly known as: NIZORAL   leflunomide 10 MG tablet Commonly known as: ARAVA Take 1 tablet by mouth daily.   lisinopril 10 MG tablet Commonly known as: ZESTRIL TAKE ONE TABLET EVERY DAY   magnesium oxide 400 MG tablet Commonly known as: MAG-OX Take 1 tablet by mouth daily.   metFORMIN 500 MG tablet Commonly known as: GLUCOPHAGE Take 500 mg by mouth 2 (two) times daily.   nitroGLYCERIN  0.4 MG SL tablet Commonly known as: NITROSTAT DISSOLVE 1 TABLET UNDER THE TONGUE EVERY 5 MINUTES AS NEEDED FOR CHEST PAIN.   OMEGA 3 PO Take 1 tablet 2 (two) times daily by mouth.   pravastatin 20 MG tablet Commonly known as: PRAVACHOL Take 1 tablet (20 mg total) by mouth every evening.   terbinafine 250 MG tablet Commonly known as: LAMISIL Take by mouth.   thyroid 90 MG tablet Commonly known as: ARMOUR Take 90 mg by mouth every morning.   trospium 20 MG tablet Commonly known as: SANCTURA Take 20 mg by mouth 2 (two) times daily. What changed: Another medication with the same name was removed. Continue taking this medication, and follow the directions you see here. Changed by: Abbie Sons, MD   UNABLE TO FIND 500 mg. Med Name: Hemp  Allergies:  Allergies  Allergen Reactions  . Celecoxib Rash    Skin rash     Family History: Family History  Problem Relation Age of Onset  . Pancreatic cancer Mother   . Bone cancer Father   . Alzheimer's disease Sister   . Heart attack Paternal Uncle   . Arthritis Maternal Grandfather   . Alzheimer's disease Paternal Grandmother   . Lung cancer Paternal Grandfather     Social History:  reports that he quit smoking about 46 years ago. His smoking use included cigarettes. He has a 5.00 pack-year smoking history. He quit smokeless tobacco use about 45 years ago.  His smokeless tobacco use included chew. He reports current alcohol use of about 1.0 standard drinks of alcohol per week. He reports that he does not use drugs.  ROS: UROLOGY Frequent Urination?: No Hard to postpone urination?: Yes Burning/pain with urination?: No Get up at night to urinate?: No Leakage of urine?: Yes Urine stream starts and stops?: No Trouble starting stream?: No Do you have to strain to urinate?: No Blood in urine?: No Urinary tract infection?: No Sexually transmitted disease?: No Injury to kidneys or bladder?: No Painful intercourse?:  No Weak stream?: No Erection problems?: Yes Penile pain?: Yes  Gastrointestinal Nausea?: No Vomiting?: No Indigestion/heartburn?: No Diarrhea?: No Constipation?: No  Constitutional Fever: No Night sweats?: No Weight loss?: No Fatigue?: Yes  Skin Skin rash/lesions?: No Itching?: No  Eyes Blurred vision?: No Double vision?: No  Ears/Nose/Throat Sore throat?: No Sinus problems?: No  Hematologic/Lymphatic Swollen glands?: No Easy bruising?: Yes  Cardiovascular Leg swelling?: No Chest pain?: No  Respiratory Cough?: Yes Shortness of breath?: Yes  Endocrine Excessive thirst?: No  Musculoskeletal Back pain?: No Joint pain?: No  Neurological Headaches?: No Dizziness?: No  Psychologic Depression?: No Anxiety?: No  Physical Exam: There were no vitals taken for this visit.  Constitutional:  Alert and oriented, No acute distress. HEENT: [place] AT, moist mucus membranes.  Trachea midline, no masses. Cardiovascular: No clubbing, cyanosis, or edema. Respiratory: Normal respiratory effort, no increased work of breathing. GU: Phallus uncircumcised.  Foreskin tight but retractable Skin: No rashes, bruises or suspicious lesions. Neurologic: Grossly intact, no focal deficits, moving all 4 extremities. Psychiatric: Normal mood and affect.   Assessment & Plan:    - BPH with detrusor overactivity Doing well on present management.  Is currently satisfied with his voiding pattern.  - Phimosis Additional management options were discussed including circumcision and dorsal slit.  He states his symptoms are not bothersome enough at this point that he desires to proceed with surgical management.  Return in about 1 year (around 01/20/2020) for Recheck.  Abbie Sons, [place] [address] [place], [place] [address] [phone]

## 2019-01-23 IMAGING — US US CAROTID DUPLEX BILAT
1 series · 13 of 24 positions shown · non-contrast
Comparison: None.

CLINICAL DATA: Syncopal episode. History of CAD (post coronary
stent placement), hypertension and hyperlipidemia.

EXAM:
BILATERAL CAROTID DUPLEX ULTRASOUND
TECHNIQUE: Gray scale imaging, color Doppler and duplex ultrasound were
performed of bilateral carotid and vertebral arteries in the neck.

[Series 1: us carotid duplex bilat · 13 of 67 slices shown]
[im 1/67]
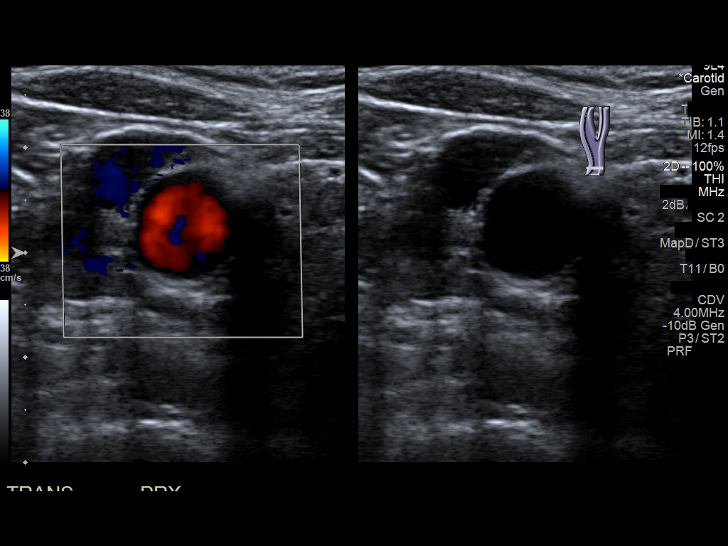
[im 6/67]
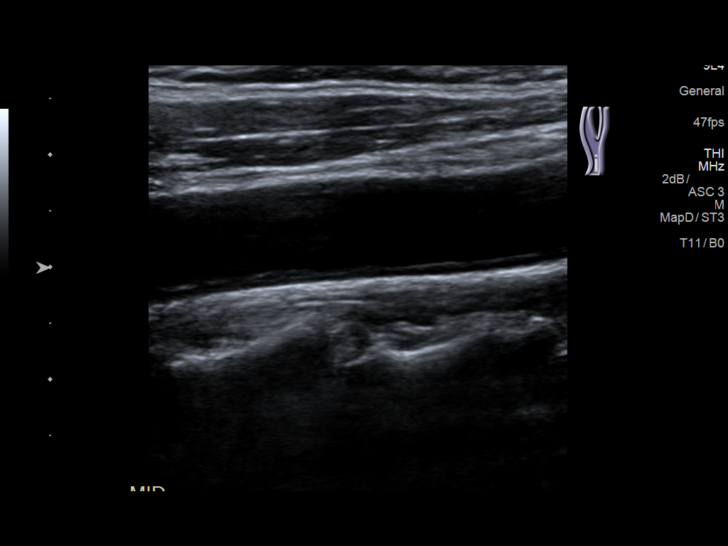
[im 12/67]
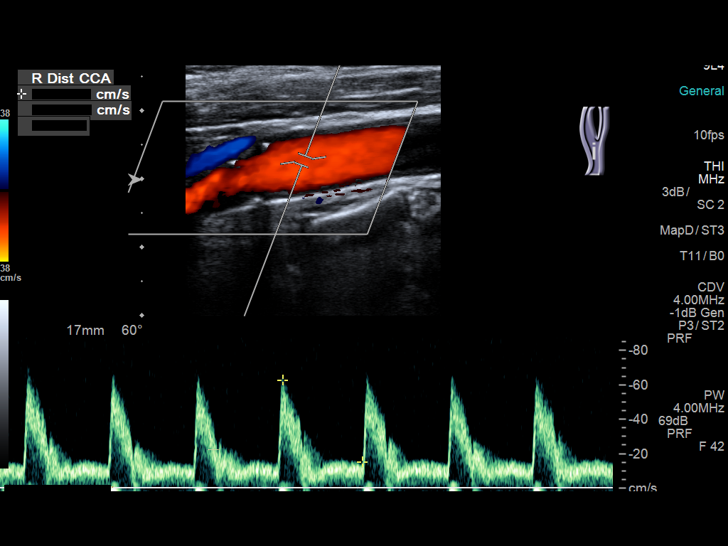
[im 18/67]
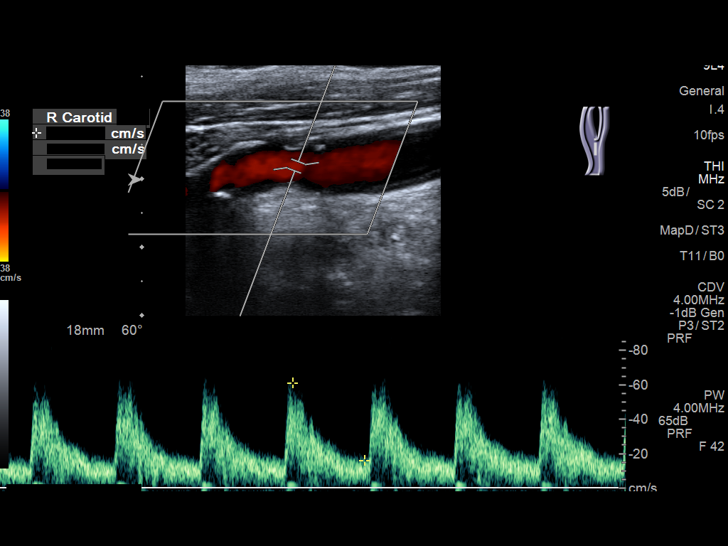
[im 23/67]
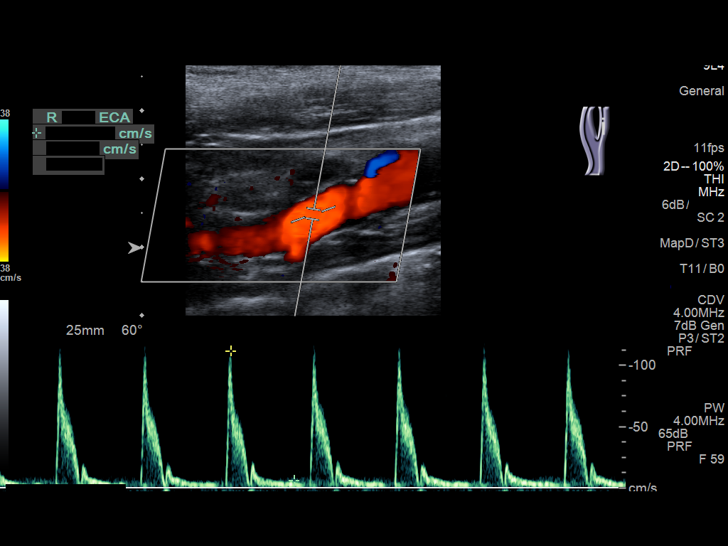
[im 29/67]
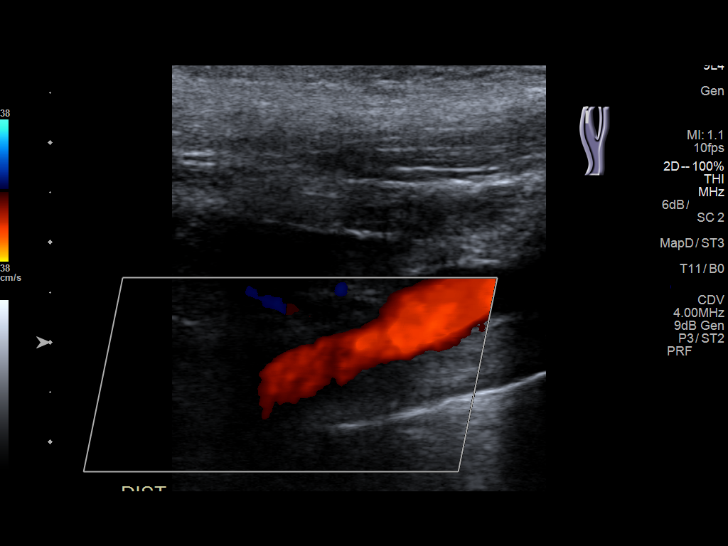
[im 35/67]
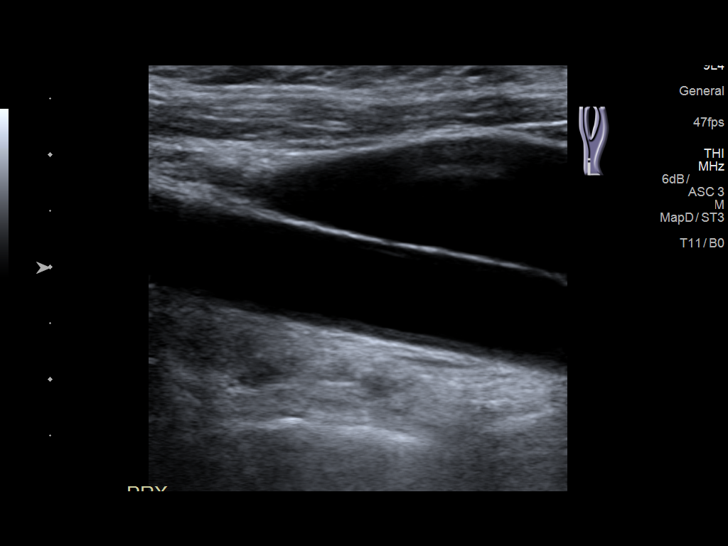
[im 38/67]
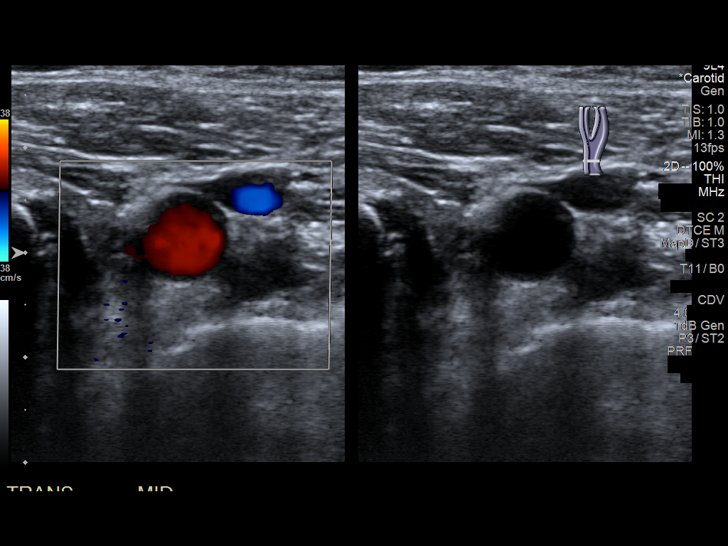
[im 44/67]
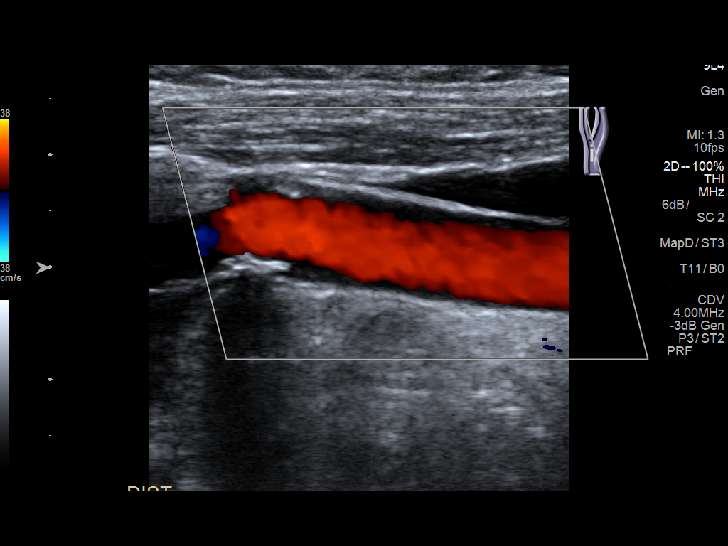
[im 49/67]
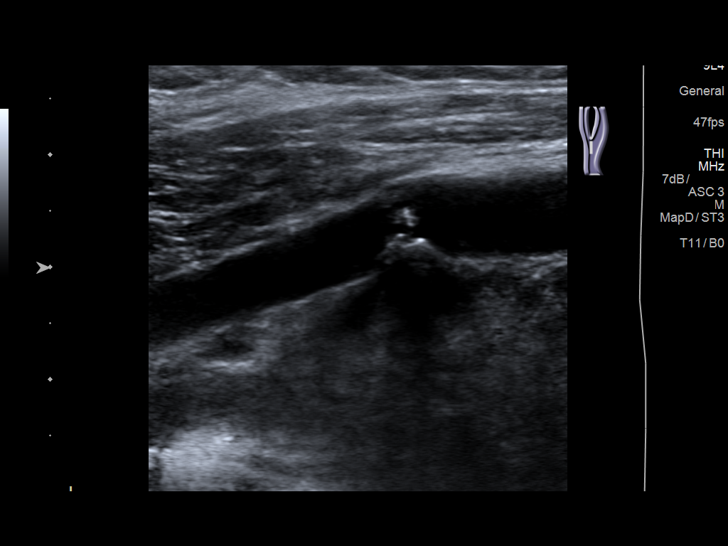
[im 55/67]
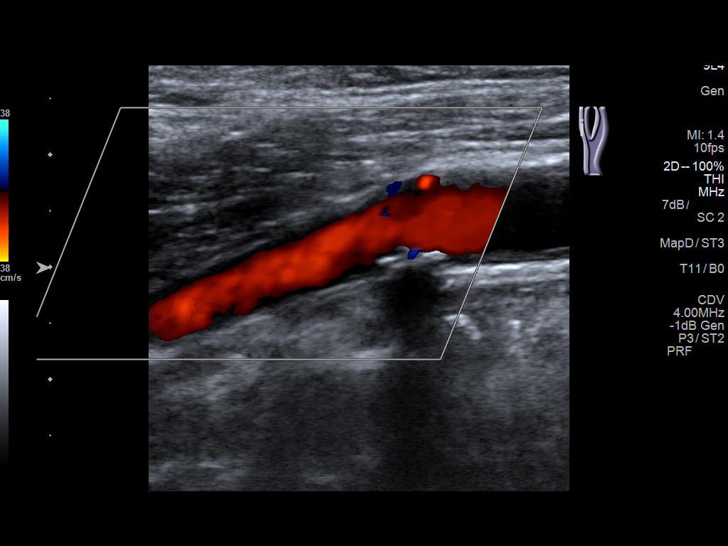
[im 61/67]
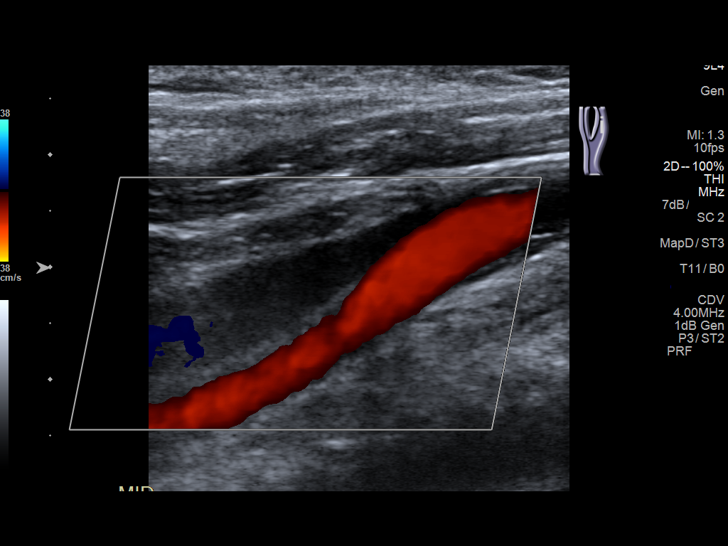
[im 67/67]
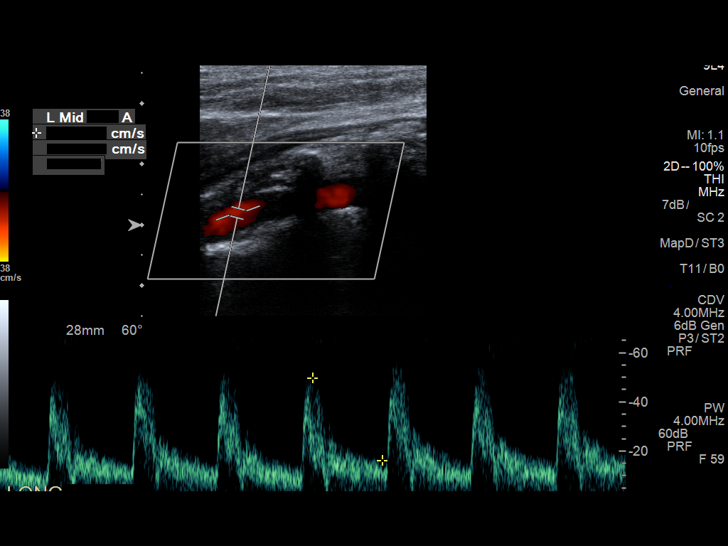

[13 of 24 positions shown; findings below may reference images not displayed]

FINDINGS: Criteria: Quantification of carotid stenosis is based on velocity
parameters that correlate the residual internal carotid diameter
with NASCET-based stenosis levels, using the diameter of the distal
internal carotid lumen as the denominator for stenosis measurement.

The following velocity measurements were obtained:

RIGHT

ICA:  115/30 cm/sec

CCA:  69/14 cm/sec

SYSTOLIC ICA/CCA RATIO:

DIASTOLIC ICA/CCA RATIO:

ECA:  112 cm/sec

LEFT

ICA:  68/18 cm/sec

CCA:  98/11 cm/sec

SYSTOLIC ICA/CCA RATIO:

DIASTOLIC ICA/CCA RATIO:

ECA:  100 cm/sec

RIGHT CAROTID ARTERY: There is a minimal amount of eccentric
echogenic plaque within the right carotid bulb (images 21, 22 and
24), extending to involve the origin and proximal aspect the right
internal carotid artery (image 33), not resulting in elevated peak
systolic velocities within the interrogated course the right
internal carotid artery to suggest a hemodynamically significant
stenosis.

RIGHT VERTEBRAL ARTERY:  Antegrade Flow

LEFT CAROTID ARTERY: There is a minimal to moderate amount of
eccentric mixed echogenic plaque within the left carotid bulb
(images 55, 56 and 57), extending to involve the origin and proximal
aspects of the left internal carotid artery (image 68), not
resulting in elevated peak systolic velocities within the
interrogated course the left internal carotid artery to suggest a
hemodynamically significant stenosis.

LEFT VERTEBRAL ARTERY:  Antegrade Flow
IMPRESSION: Minimal to moderate amount of bilateral atherosclerotic plaque, left
subjectively greater than right, not resulting in a hemodynamically
significant stenosis within either internal carotid artery.

## 2019-02-09 ENCOUNTER — Ambulatory Visit (INDEPENDENT_AMBULATORY_CARE_PROVIDER_SITE_OTHER): Payer: Medicare Other | Admitting: *Deleted

## 2019-02-09 DIAGNOSIS — I442 Atrioventricular block, complete: Secondary | ICD-10-CM

## 2019-02-09 LAB — CUP PACEART REMOTE DEVICE CHECK
Battery Remaining Longevity: 30 mo
Battery Remaining Percentage: 25 %
Battery Voltage: 2.8 V
Brady Statistic AP VP Percent: 97 %
Brady Statistic AP VS Percent: 1 %
Brady Statistic AS VP Percent: 3 %
Brady Statistic AS VS Percent: 1 %
Brady Statistic RA Percent Paced: 97 %
Brady Statistic RV Percent Paced: 99 %
Date Time Interrogation Session: 20200916063210
Implantable Lead Implant Date: 20130716
Implantable Lead Implant Date: 20130716
Implantable Lead Location: 753859
Implantable Lead Location: 753860
Implantable Lead Model: 1948
Implantable Pulse Generator Implant Date: 20130716
Lead Channel Impedance Value: 360 Ohm
Lead Channel Impedance Value: 660 Ohm
Lead Channel Pacing Threshold Amplitude: 0.5 V
Lead Channel Pacing Threshold Amplitude: 0.625 V
Lead Channel Pacing Threshold Pulse Width: 0.5 ms
Lead Channel Pacing Threshold Pulse Width: 0.5 ms
Lead Channel Sensing Intrinsic Amplitude: 1.9 mV
Lead Channel Sensing Intrinsic Amplitude: 12 mV
Lead Channel Setting Pacing Amplitude: 0.75 V
Lead Channel Setting Pacing Amplitude: 1.625
Lead Channel Setting Pacing Pulse Width: 0.5 ms
Lead Channel Setting Sensing Sensitivity: 4 mV
Pulse Gen Model: 2210
Pulse Gen Serial Number: 7356500

## 2019-02-15 ENCOUNTER — Encounter: Payer: Self-pay | Admitting: Cardiology

## 2019-02-15 NOTE — Progress Notes (Signed)
Remote pacemaker transmission.   

## 2019-02-22 ENCOUNTER — Other Ambulatory Visit: Payer: Self-pay | Admitting: Internal Medicine

## 2019-02-22 DIAGNOSIS — R1310 Dysphagia, unspecified: Secondary | ICD-10-CM

## 2019-02-28 ENCOUNTER — Other Ambulatory Visit: Payer: Self-pay

## 2019-02-28 ENCOUNTER — Ambulatory Visit
Admission: RE | Admit: 2019-02-28 | Discharge: 2019-02-28 | Disposition: A | Discharge status: Home / Self Care | Payer: Medicare Other | Source: Ambulatory Visit | Attending: Internal Medicine | Admitting: Internal Medicine

## 2019-02-28 DIAGNOSIS — R1310 Dysphagia, unspecified: Secondary | ICD-10-CM | POA: Diagnosis present

## 2019-03-07 ENCOUNTER — Encounter: Payer: Self-pay | Admitting: Internal Medicine

## 2019-03-07 ENCOUNTER — Telehealth (INDEPENDENT_AMBULATORY_CARE_PROVIDER_SITE_OTHER): Payer: Medicare Other | Admitting: Internal Medicine

## 2019-03-07 ENCOUNTER — Other Ambulatory Visit: Payer: Self-pay

## 2019-03-07 VITALS — BP 156/77 | HR 61 | Ht 67.0 in | Wt 185.0 lb

## 2019-03-07 DIAGNOSIS — I442 Atrioventricular block, complete: Secondary | ICD-10-CM

## 2019-03-07 DIAGNOSIS — I2510 Atherosclerotic heart disease of native coronary artery without angina pectoris: Secondary | ICD-10-CM

## 2019-03-07 DIAGNOSIS — I10 Essential (primary) hypertension: Secondary | ICD-10-CM

## 2019-03-07 NOTE — Progress Notes (Signed)
Electrophysiology TeleHealth Note  Due to national recommendations of social distancing due to [place] 19, an audio telehealth visit is felt to be most appropriate for this patient at this time.  Verbal consent was obtained by me for the telehealth visit today.  The patient does not have capability for a virtual visit.  A phone visit is therefore required today.   Date:  03/07/2019   ID:  Cameron [name], DOB 1940-12-18, MRN [id number]  Location: patient's home  Provider location:  [facility]  Evaluation Performed: Follow-up visit  PCP:  [name], MD  Primary Cardiologist:  [facility] Electrophysiologist:  Dr [name]  Chief Complaint:  palpitations  History of Present Illness:    Cameron [name] is a 78 y.o. male who presents via telehealth conferencing today.  Since last being seen in our clinic, the patient reports doing very well.  Today, he denies symptoms of palpitations, chest pain, shortness of breath,  lower extremity edema, dizziness, presyncope, or syncope.  The patient is otherwise without complaint today.  The patient denies symptoms of fevers, chills, cough, or new SOB worrisome for COVID 19.  Past Medical History:  Diagnosis Date   Appendicitis    Atrial fibrillation ([place]) 12/12/2013   BPH (benign prostatic hypertrophy)    CAD (coronary artery disease) 12/2015   Cath by Dr [name] reveals distal and small vessel CAD.  Medical therapy advised.   Chest pain 12/03/2015   CHF (congestive heart failure) (HCC)    Complete heart block ([place])    s/p PPM   Coronary artery disease    Coronary artery disease involving native coronary artery of native heart with unstable angina pectoris ([place]) 08/11/2017   History of blood transfusion 1968   "probably; related to getting [place]   History of kidney stones    Hyperglycemia 11/05/2013   Hyperlipidemia 11/05/2013   Hypertension    Hypothyroidism    Hypothyroidism, unspecified 11/05/2013     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  AHI was 20/h while supine.  Oxygen saturations dropped to 87%.  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   Rheumatoid arthritis ([place])    "hands" (08/11/2017)   Type II diabetes mellitus ([place])     Past Surgical History:  Procedure Laterality Date   BACK SURGERY     CARDIAC CATHETERIZATION N/A 12/28/2015   Procedure: Left Heart Cath and Coronary Angiography;  Surgeon: Belva Crome, MD;  Location: [place] [facility];  Service: Cardiovascular;  Laterality: N/A;   CATARACT EXTRACTION W/ INTRAOCULAR LENS  IMPLANT, BILATERAL Bilateral    CORONARY ANGIOPLASTY WITH STENT PLACEMENT  08/11/2017   "2 stents"   CORONARY STENT INTERVENTION N/A 08/11/2017   Procedure: CORONARY STENT INTERVENTION;  Surgeon: Troy Sine, MD;  Location: [place] [facility];  Service: Cardiovascular;  Laterality: N/A;   CYSTOSCOPY W/ STONE MANIPULATION     INGUINAL HERNIA REPAIR Left    INSERT / REPLACE / REMOVE PACEMAKER  12/09/2011   SJM Accent DR RF implanted by DR Aarav Burgett for complete heart block and syncope   JOINT REPLACEMENT     LAPAROSCOPIC CHOLECYSTECTOMY     LITHOTRIPSY     LUMBAR [place]     "removed arthritis and spurs"  PERMANENT PACEMAKER INSERTION N/A 12/09/2011   Procedure: PERMANENT PACEMAKER INSERTION;  Surgeon: [name], MD;  Location: [facility] C[facility];  Service: Cardiovascular;  Laterality: N/A;   REPLACEMENT TOTAL KNEE Right    RIGHT/LEFT HEART CATH AND CORONARY ANGIOGRAPHY N/A 08/11/2017   Procedure: RIGHT/LEFT HEART CATH AND CORONARY ANGIOGRAPHY;  Surgeon: Larey Dresser, MD;  Location: [place] [facility];  Service: Cardiovascular;  Laterality: N/A;   TRANSURETHRAL RESECTION OF PROSTATE  2017/2018    Current Outpatient Medications   Medication Sig Dispense Refill   acetaminophen (TYLENOL) 500 MG tablet Take 500 mg by mouth every 6 (six) hours as needed.     aspirin 81 MG chewable tablet Chew by mouth daily.     BYSTOLIC 5 MG tablet Take 5 mg by mouth daily.      clopidogrel (PLAVIX) 75 MG tablet TAKE ONE TABLET BY MOUTH EVERY MORNING WITH BREAKFAST 90 tablet 2   Ferrous Sulfate (IRON PO) Take 300 mg by mouth.     ketoconazole (NIZORAL) 2 % cream      leflunomide (ARAVA) 10 MG tablet Take 1 tablet by mouth daily.     lisinopril (ZESTRIL) 10 MG tablet Take 1 tablet (10 mg total) by mouth daily. *NEEDS OFFICE VISIT FOR FURTHER REFILLS* 90 tablet 0   magnesium oxide (MAG-OX) 400 MG tablet Take 1 tablet by mouth daily.     metFORMIN (GLUCOPHAGE) 500 MG tablet Take 500 mg by mouth 2 (two) times daily.     nitroGLYCERIN (NITROSTAT) 0.4 MG SL tablet DISSOLVE 1 TABLET UNDER THE TONGUE EVERY 5 MINUTES AS NEEDED FOR CHEST PAIN. 75 tablet 1   Omega-3 Fatty Acids (OMEGA 3 PO) Take 1 tablet 2 (two) times daily by mouth.     pravastatin (PRAVACHOL) 20 MG tablet Take 1 tablet (20 mg total) by mouth every evening. *NEEDS OFFICE VISIT FOR FURTHER REFILLS* 90 tablet 0   terbinafine (LAMISIL) 250 MG tablet Take by mouth.     thyroid (ARMOUR) 90 MG tablet Take 90 mg by mouth every morning.      trospium (SANCTURA) 20 MG tablet Take 20 mg by mouth 2 (two) times daily.     UNABLE TO FIND 500 mg. Med Name: Hemp     No current facility-administered medications for this visit.    Facility-Administered Medications Ordered in Other Visits  Medication Dose Route Frequency Provider Last Rate Last Dose   ondansetron (ZOFRAN) 4 mg in sodium chloride 0.9 % 50 mL IVPB  4 mg Intravenous Q6H PRN Ashok Pall, MD        Allergies:   Celecoxib   Social History:  The patient  reports that he quit smoking about 46 years ago. His smoking use included cigarettes. He has a 5.00 pack-year smoking history. He quit smokeless tobacco use  about 45 years ago.  His smokeless tobacco use included chew. He reports current alcohol use of about 1.0 standard drinks of alcohol per week. He reports that he does not use drugs.   Family History:  The patient's  family history includes Alzheimer's disease in his paternal grandmother and sister; Arthritis in his maternal grandfather; Bone cancer in his father; Heart attack in his paternal uncle; Lung cancer in his paternal grandfather; Pancreatic cancer in his mother.   ROS:  Please see the history of present illness.   All other systems are personally reviewed and negative.    Exam:    Vital Signs:  BP (!) 156/77    Pulse 61  Ht _0  (1.702 m)    Wt 185 lb (83.9 kg)    BMI 28.98 kg/m   Well sounding, alert and conversant   Labs/Other Tests and Data Reviewed:    Recent Labs: 04/02/2018: BUN 24; Creatinine, Ser 0.94; Potassium 5.3; Sodium 143 11/10/2018: Hemoglobin 13.0; Platelets 182   Wt Readings from Last 3 Encounters:  03/07/19 185 lb (83.9 kg)  01/20/19 187 lb (84.8 kg)  01/17/19 187 lb (84.8 kg)    Echo 09/17/17- EF 55-60%  Last device remote is reviewed from [facility] PDF which reveals normal device function, no arrhythmias    ASSESSMENT & PLAN:    1.  Complete heart block Remotes are uptodate Normal device function  2. HTN Stable No change required today  3. CAD No ischemic symptoms  Follow-up:  Return to see EP NP in a year   Patient Risk:  after full review of this patients clinical status, I feel that they are at moderate risk at this time.  Today, I have spent 15 minutes with the patient with telehealth technology discussing arrhythmia management .    SignedThompson Grayer, MD  03/07/2019 2:10 PM     [address] [place] [address] [phone] (office) [phone] (fax)

## 2019-03-08 ENCOUNTER — Telehealth: Payer: Self-pay

## 2019-03-08 NOTE — Telephone Encounter (Signed)
Pt wife has questions about the CPAP machine. She wants to know about where the supply office? I told her I will send sleep study a message and they will give her a call back. The number is [phone].

## 2019-03-09 ENCOUNTER — Telehealth: Payer: Self-pay | Admitting: Internal Medicine

## 2019-03-09 NOTE — Telephone Encounter (Signed)
Spoke with patient.  He states he is on his way to a MD appointment.  He found the address to the CPAP company on bottom of machine and he is going there after his appointment to have his mask looked at.  I asked patient if MR managed patient's sleep, the patient believes so. I cannot find a referral to a [place] , told patient that when he gets to DME company to ask who prescribed the machine and if he needed anything from our office to please let us know.  Patient is going to [facility] this afternoon to see about his broken mask. Will close message, open new if patient calls back

## 2019-03-11 NOTE — Telephone Encounter (Signed)
Message sent to [facility]. This is a patient of Tracie Turner's.

## 2019-03-11 NOTE — Telephone Encounter (Signed)
Reached out to the patient and he was able to contact his dme to get the help he needed. He states they are helping him with his problem.

## 2019-04-25 ENCOUNTER — Telehealth: Payer: Self-pay

## 2019-04-25 NOTE — Telephone Encounter (Signed)
Cameron [name] called stating that she had picked up refills for patient and there was a note to call and schedule appointment. She would like to know when patient needs to see [name] again?

## 2019-05-11 ENCOUNTER — Ambulatory Visit (INDEPENDENT_AMBULATORY_CARE_PROVIDER_SITE_OTHER): Payer: Medicare Other | Admitting: *Deleted

## 2019-05-11 DIAGNOSIS — I442 Atrioventricular block, complete: Secondary | ICD-10-CM

## 2019-05-11 LAB — CUP PACEART REMOTE DEVICE CHECK
Battery Remaining Longevity: 22 mo
Battery Remaining Percentage: 19 %
Battery Voltage: 2.77 V
Brady Statistic AP VP Percent: 97 %
Brady Statistic AP VS Percent: 1 %
Brady Statistic AS VP Percent: 2.9 %
Brady Statistic AS VS Percent: 1 %
Brady Statistic RA Percent Paced: 97 %
Brady Statistic RV Percent Paced: 99 %
Date Time Interrogation Session: 20201216020017
Implantable Lead Implant Date: 20130716
Implantable Lead Implant Date: 20130716
Implantable Lead Location: 753859
Implantable Lead Location: 753860
Implantable Lead Model: 1948
Implantable Pulse Generator Implant Date: 20130716
Lead Channel Impedance Value: 340 Ohm
Lead Channel Impedance Value: 650 Ohm
Lead Channel Pacing Threshold Amplitude: 0.5 V
Lead Channel Pacing Threshold Amplitude: 0.625 V
Lead Channel Pacing Threshold Pulse Width: 0.5 ms
Lead Channel Pacing Threshold Pulse Width: 0.5 ms
Lead Channel Sensing Intrinsic Amplitude: 12 mV
Lead Channel Sensing Intrinsic Amplitude: 2.1 mV
Lead Channel Setting Pacing Amplitude: 0.75 V
Lead Channel Setting Pacing Amplitude: 1.625
Lead Channel Setting Pacing Pulse Width: 0.5 ms
Lead Channel Setting Sensing Sensitivity: 4 mV
Pulse Gen Model: 2210
Pulse Gen Serial Number: 7356500

## 2019-05-24 ENCOUNTER — Telehealth: Payer: Self-pay | Admitting: Internal Medicine

## 2019-05-24 ENCOUNTER — Telehealth: Payer: Self-pay

## 2019-05-24 NOTE — Telephone Encounter (Signed)
I called the patient back and he stated that he did not ask about being tested for covid or if it was needed.  I confirmed the patient has not been recently tested and does not have any pending test results. Patient denied having been around anyone to his knowledge who was positive for covid.  The patient stated that he has had two occassions that were two weeks apart when he coughed up yellow congestion. Patient denied fever, chills, muscle aches, coughing. Patient offered a video visit, but patient stated he wanted to keep the appointment so that [name] can listen to his lungs since it has been a while since he has seen him directly.   I advised him that if the color of the congestion darkens or becomes green/brown, increased cough, develops fever or chills, to call our office. Patient voiced understanding. Nothing further needed at this time.

## 2019-05-24 NOTE — Telephone Encounter (Signed)
Error.

## 2019-05-26 ENCOUNTER — Telehealth: Payer: Self-pay | Admitting: Internal Medicine

## 2019-05-26 NOTE — Telephone Encounter (Signed)
Returned call to patient's wife.Spoke to patient and wife.He stated he has been sob for the past 1 to 1&1/2 months.No chest pain.Increase swelling in both lower legs.Stated he has gained 6 to 7 lbs in the past 1 month.Appointment scheduled with [name] PA 06/01/19 at 1:30 pm.

## 2019-05-26 NOTE — Telephone Encounter (Signed)
New Message   Pt c/o swelling: STAT is pt has developed SOB within 24 hours  1) How much weight have you gained and in what time span? 7 lbs in the last few months  2) If swelling, where is the swelling located? Right leg mostly, left leg as well  3) Are you currently taking a fluid pill? Yes  4) Are you currently SOB? Yes, since the swelling started a few months ago.  5) Do you have a log of your daily weights (if so, list)? n/a  6) Have you gained 3 pounds in a day or 5 pounds in a week? No  7) Have you traveled recently? No  Patient also reports that his HR is ranging from 60-75 and he feels like he is having some circulation issues. Current HR is 60 and oxygen 97.

## 2019-06-01 ENCOUNTER — Encounter: Payer: Self-pay | Admitting: Internal Medicine

## 2019-06-01 ENCOUNTER — Ambulatory Visit (INDEPENDENT_AMBULATORY_CARE_PROVIDER_SITE_OTHER): Payer: Medicare Other | Admitting: Physician Assistant

## 2019-06-01 ENCOUNTER — Ambulatory Visit (INDEPENDENT_AMBULATORY_CARE_PROVIDER_SITE_OTHER): Payer: Medicare Other | Admitting: Internal Medicine

## 2019-06-01 ENCOUNTER — Encounter: Payer: Self-pay | Admitting: Physician Assistant

## 2019-06-01 ENCOUNTER — Other Ambulatory Visit: Payer: Self-pay

## 2019-06-01 VITALS — BP 152/69 | HR 63 | Ht 67.0 in | Wt 197.2 lb

## 2019-06-01 VITALS — BP 130/72 | HR 66 | Ht 67.0 in | Wt 194.8 lb

## 2019-06-01 DIAGNOSIS — I2510 Atherosclerotic heart disease of native coronary artery without angina pectoris: Secondary | ICD-10-CM

## 2019-06-01 DIAGNOSIS — I480 Paroxysmal atrial fibrillation: Secondary | ICD-10-CM

## 2019-06-01 DIAGNOSIS — Z950 Presence of cardiac pacemaker: Secondary | ICD-10-CM

## 2019-06-01 DIAGNOSIS — E039 Hypothyroidism, unspecified: Secondary | ICD-10-CM | POA: Diagnosis not present

## 2019-06-01 DIAGNOSIS — I442 Atrioventricular block, complete: Secondary | ICD-10-CM

## 2019-06-01 DIAGNOSIS — M359 Systemic involvement of connective tissue, unspecified: Secondary | ICD-10-CM

## 2019-06-01 DIAGNOSIS — E119 Type 2 diabetes mellitus without complications: Secondary | ICD-10-CM

## 2019-06-01 DIAGNOSIS — R0609 Other forms of dyspnea: Secondary | ICD-10-CM

## 2019-06-01 DIAGNOSIS — J8489 Other specified interstitial pulmonary diseases: Secondary | ICD-10-CM | POA: Diagnosis not present

## 2019-06-01 DIAGNOSIS — R0600 Dyspnea, unspecified: Secondary | ICD-10-CM | POA: Diagnosis not present

## 2019-06-01 DIAGNOSIS — I2729 Other secondary pulmonary hypertension: Secondary | ICD-10-CM | POA: Diagnosis not present

## 2019-06-01 DIAGNOSIS — I10 Essential (primary) hypertension: Secondary | ICD-10-CM | POA: Diagnosis not present

## 2019-06-01 DIAGNOSIS — G4733 Obstructive sleep apnea (adult) (pediatric): Secondary | ICD-10-CM

## 2019-06-01 MED ORDER — FUROSEMIDE 20 MG PO TABS: 20.0000 mg | ORAL_TABLET | ORAL | 1 refills | Status: DC | PRN

## 2019-06-01 NOTE — Patient Instructions (Addendum)
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 -However, now it is worse -I do not think this is because your lung disease [pulmonary fibrosis] is worse.  In fact, I think your lung disease is stable based on August 2020 CT scan of the chest and breathing test.  Plan  = Please check with [name] if any heart issues might be contributing to worsening shortness of breath -Meanwhile we will get a breathing test spirometry and DLCO first available in the next few months  -If indeed this test is stable then we will get a pulmonary stress test  -If this test shows your pulmonary fibrosis might be getting worse we will get a high-resolution CT chest   Followup  - 15 min telephine visit after spirometry and dlco test  - to review result and discuss nexdt steps

## 2019-06-01 NOTE — Addendum Note (Signed)
Addended by: Lorretta Harp on: 06/01/2019 11:36 AM   Modules accepted: Orders

## 2019-06-01 NOTE — Patient Instructions (Signed)
Medication Instructions:  START Lasix 32m Take 1 tablet as needed for leg swelling  *If you need a refill on your cardiac medications before your next appointment, please call your pharmacy*  Lab Work: None  If you have labs (blood work) drawn today and your tests are completely normal, you will receive your results only by: .Marland KitchenMyChart Message (if you have MyChart) OR . A paper copy in the mail If you have any lab test that is abnormal or we need to change your treatment, we will call you to review the results.  Testing/Procedures: Your physician has requested that you have an echocardiogram. Echocardiography is a painless test that uses sound waves to create images of your heart. It provides your doctor with information about the size and shape of your heart and how well your heart's chambers and valves are working. This procedure takes approximately one hour. There are no restrictions for this procedure. TEST [address] BE COMPLETED [address]  Follow-Up: [facility] you and your health needs are our priority.  As part of our continuing mission to provide you with exceptional heart care, we have created designated Provider Care Teams.  These Care Teams include your primary Cardiologist (physician) and Advanced Practice Providers (APPs -  Physician Assistants and Nurse Practitioners) who all work together to provide you with the care you need, when you need it.  Your next appointment:   1 month(s)  The format for your next appointment:   In Person  Provider:   You may see TShelva Majestic MD or one of the following Advanced Practice Providers on your designated Care Team:    HAlmyra Deforest PA-C  AFabian Sharp PA-C or   KRoby Lofts [place]ont  Other Instructions

## 2019-06-01 NOTE — Progress Notes (Signed)
PCP [name], MD  HPI   IOV 07/10/2017  Chief Complaint  Patient presents with  . Advice Only    Referred by [facility] due to SOB.  PFT done 05/28/17.  Pt has been having issues with SOB x4 months especially with exertion and has some mild chest tightness. Denies any cough.    79 year old male referred by [name] for evaluation of shortness of breath after cardiac etiologies ruled out.  He tells me that he is a remote smoker.  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  He has a long-standing history of rheumatoid arthritis followed by Dr. Jefm Bryant in [place].  He is to be on methotrexate for many years and stopped taking it because of cardiac dysfunction [he personally is convinced that methotrexate because this].  He was then on leflunomide as of 2017 but is currently not on it.  His last rheumatoid factor and CCP antibodies were negative on my personal chart review of the outside records in 2016.    Now for the last 3 or 6 months he is got insidious onset of shortness of breath that is slowly progressive.  His dyspnea on exertion relieved by rest.  Class II-3 activities.  There I  s no associated cough or orthopnea proximal nocturnal dyspnea.  He did have some edema but this got cleared up but the dyspnea is continuing to get worse.  In the last few months he has had a cardiac echo that showed pulmonary hypertension.  Did have cardiac stress test that is normal.  Had pulmonary function test that showed isolated reduction in diffusion capacity and therefore he has been referred here.  Walking desaturation test on 07/10/2017 185 feet x 3 laps on ROOM AIR:  did NOT desaturate. Rest pulse ox was 100%, final pulse ox was 98%. HR response 60/min at rest to 121/min at peak exertion. Patient Cameron [name]  Did not Desaturate < 88% . Cameron [name] did not  Desaturated  </= 3% points. Cameron [name]   OV 08/04/2017  Chief Complaint  Patient presents with  . Follow-up    ILD    Follow-up interstitial lung disease workup  After the last visit no interim problems.  He has some chronic pedal edema that he will talk to about with his primary care physician.  He did see Dr. Jefm Bryant rheumatologist in July 15, 2017.  I reviewed his notes.  It is not specifically indicate patient has nonspecific seronegative arthritis with a differential diagnosis of seronegative rheumatoid arthritis versus psoriatic.  Patient still seems to think the root of all his problems is the methotrexate he took for over 10 years.  At this point in time there is no decompensation.  As part of the ILD workup his pulmonary function test shows mild reduction in diffusion capacity.  Correlating with this is evidence of pulmonary hypertension on the echo and high-resolution CT chest enlarged pulmonary arteries.  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  His autoimmune panel is negative.  And so the vasculitis panel and hypersensitivity pneumonitis panel.    IMPRESSION: 1. Mild subpleural reticular densities in the posterolateral aspects of both lower lobes are suspicious for mild fibrotic interstitial lung disease such as  nonspecific interstitial pneumonitis. Early/mild usual interstitial pneumonitis is not excluded. 2. Aortic atherosclerosis (ICD10-170.0). Coronary artery calcification. 3. Enlarged pulmonary arteries, indicative of pulmonary arterial Hypertension.- > in echo  Nov 2018: Pulmonary arteries: Systolic pressure was moderately increased.   PA peak pressure: 55 mm Hg (S). 4. Left renal stone, partially imaged.   Electronically Signed   By: Lorin Picket M.D.   On: 07/22/2017 15:03  OV 11/03/2017  Chief Complaint  Patient presents with  . Follow-up    Pt has SOB  with exertion, some dry cough.     Follow-up suspected interstitial lung disease in the setting of rheumatoid arthritis and long methotrexate intake  He presents with his wife.  At the time of last visit I was not fully convinced that he had interstitial lung disease.  His CT scan indicated presence of pulmonary hypertension and so did his echocardiogram.  Therefore I referred him back to [name] cardiology.  In the spring 2019 he did have a right heart catheterization and left heart catheterization that showed mild pulmonary hypertension but also coronary artery blockage.  He status post 2 stents.  After that his shortness of breath improved but he tells me overall his fatigue level has not improved.  In talking to him I find out that he exercises 5 times a week doing heart track 2 times a week and the other 3 days walking a mile each time and doing weight lifting.  It appears that he takes a 20-minute nap after these exercises and then feels reenergized.  His mother feels that he does not have the effort tolerance as his younger days but he denies having any symptoms of chest pain or shortness of breath or cough when he does these heavy exertion the weight lifting or walking a mile out doing heart track.  In fact in the walking desaturation test today he walked extremely fast and had no problems.  In terms of his possible interstitial lung disease he had pulmonary function test today and felt to show some improvement and on exam he does not have any crackles.  Also his wife tells me that for the last 2 months he is using CPAP for sleep apnea and this is also helping him.      OV 06/01/2019  Subjective:  [name], male , DOB: 06/21/1940 , age 79 y.o. , MRN: [id number] , ADDRESS: [phone] [place] [address]   06/01/2019 -   Chief Complaint  Patient presents with  . Follow-up    Pt states he has been doing okay since last visit but states he has been having a little more  SOB x4 weeks now. Pt also has occ coughing with yellow phlegm.   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 -returns for follow-up.  I personally have not seen him since the summer 2019.  He says overall he has been stable.  In August 2020 he had a CT scan of the chest that confirmed the presence of interstitial lung disease in the setting of his rheumatoid arthritis.  He was stable.  His pulmonary function test was stable.  He says now in the last 2 months has had a decline in shortness of breath.  There is also some cough with sputum production but that has resolved.  It is definitely present with exertion but relieved by rest.  His walking desaturation test  compared to 18 months ago is roughly the same except that he is very tachycardic.  He does have a pacemaker.  He has an appoint with [name] his electrophysiologist today.  His symptom scores are listed below.  SYMPTOM SCALE - ILD 06/01/2019   O2 use no  Shortness of Breath 0 -> 5 scale with 5 being worst (score 6 If unable to do)  At rest 0  Simple tasks - showers, clothes change, eating, shaving 0  Household (dishes, doing bed, laundry) 0  Shopping 0  Walking level at own pace 2  Walking keeping up with others of same age 13  Walking up Stairs 3  Walking up [place] 3  Total (40 - 48) Dyspnea Score 10  How bad is your cough? 1  How bad is your fatigue 2         Simple office walk 185 feet x  3 laps goal with forehead probe 11/03/2017  06/01/2019   O2 used Room air Room air  Number laps completed 3 3  Comments about pace Fast very  99% and 66  Resting Pulse Ox/HR 100% and 70/min 99% and 66/min  Final Pulse Ox/HR 98% and 121/min 98% and 120 min  Desaturated </= 88% no   Desaturated <= 3% points no   Got Tachycardic >/= 90/min yes   Symptoms at end of test none Mild dyspea  Miscellaneous comments veryu fast Mod pace    Results for  Cameron [name], Cameron [name]" (MRN [id number]) as of 11/03/2017 14:41  Ref. Range 05/28/2017 13:28 11/03/2017 14:10 06/01/2019   FVC-Pre Latest Units: L 3.29 3.59 3.541  FVC-%Pred-Pre Latest Units: % 90 98 94  Results for Cameron [name], Cameron A "[name]" (MRN [id number]) as of 11/03/2017 14:41  Ref. Range 05/28/2017 13:28 11/03/2017 14:10 06/01/2019   DLCO unc Latest Units: ml/min/mmHg 20.35 20.97 21.58  DLCO unc % pred Latest Units: % 71 74 96%     IMPRESSION: 1. 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 There is no significant air trapping on expiratory phase imaging. Findings are not significantly changed compared to prior examinations and remain in an "indeterminate for UIP" pattern by ATS pulmonary fibrosis criteria, differential considerations including both UIP and NSIP.  2.  Coronary artery disease and aortic atherosclerosis.  3.  Left nephrolithiasis.   Electronically Signed   By: Eddie Candle M.D.   On: 01/12/2019 15:08 ROS - per HPI     has a past medical history of Appendicitis, Atrial fibrillation ([place]) (12/12/2013), BPH (benign prostatic hypertrophy), CAD (coronary artery disease) (12/2015), Chest pain (12/03/2015), CHF (congestive heart failure) ([place]), Complete heart block ([place]), Coronary artery disease, Coronary artery disease involving native coronary artery of native heart with unstable angina pectoris ([place]) (08/11/2017), History of blood transfusion (1968), History of kidney stones, Hyperglycemia (11/05/2013), Hyperlipidemia (11/05/2013), Hypertension, Hypothyroidism, Hypothyroidism, unspecified (11/05/2013), 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   reports that he quit smoking about 46 years ago. His smoking use included cigarettes. He has a 5.00 pack-year smoking history. He quit smokeless tobacco use about 46 years ago.  His smokeless tobacco use included chew.  Past Surgical History:  Procedure Laterality Date  . BACK SURGERY    . CARDIAC CATHETERIZATION N/A 12/28/2015  Procedure: Left Heart Cath and Coronary Angiography;  Surgeon: Belva Crome, MD;  Location: [place] [facility];  Service: Cardiovascular;  Laterality: N/A;  . CATARACT EXTRACTION W/ INTRAOCULAR LENS  IMPLANT, BILATERAL Bilateral   . CORONARY ANGIOPLASTY WITH STENT PLACEMENT  08/11/2017   "2 stents"  . CORONARY STENT INTERVENTION N/A 08/11/2017   Procedure: CORONARY STENT INTERVENTION;  Surgeon: Troy Sine, MD;  Location: [place] [facility];  Service: Cardiovascular;  Laterality: N/A;  . CYSTOSCOPY W/ STONE MANIPULATION    . INGUINAL HERNIA REPAIR Left   . INSERT / REPLACE / REMOVE PACEMAKER  12/09/2011   SJM Accent DR RF implanted by DR Allred for complete heart block and syncope  . JOINT REPLACEMENT    . LAPAROSCOPIC CHOLECYSTECTOMY    . LITHOTRIPSY    . LUMBAR DISC SURGERY     "removed arthritis and spurs"  . PERMANENT PACEMAKER INSERTION N/A 12/09/2011   Procedure: PERMANENT PACEMAKER INSERTION;  Surgeon: [name], MD;  Location: [facility] CATH LAB;  Service: Cardiovascular;  Laterality: N/A;  . REPLACEMENT TOTAL KNEE Right   . RIGHT/LEFT HEART CATH AND CORONARY ANGIOGRAPHY N/A 08/11/2017   Procedure: RIGHT/LEFT HEART CATH AND CORONARY ANGIOGRAPHY;  Surgeon: Larey Dresser, MD;  Location: [place] [facility];  Service: Cardiovascular;  Laterality: N/A;  . TRANSURETHRAL RESECTION OF PROSTATE  2017/2018    Allergies  Allergen Reactions  . Celecoxib Rash    Skin rash     Immunization History  Administered Date(s) Administered  . Influenza, High Dose Seasonal PF 03/26/2017  . Influenza-Unspecified  05/03/2015, 02/13/2016, 03/26/2016, 03/19/2017, 03/03/2018, 03/17/2019  . Pneumococcal Polysaccharide-23 12/10/2011, 01/30/2018  . Pneumococcal-Unspecified 03/26/2016, 02/04/2018  . Tdap 01/17/2016, 02/04/2018    Family History  Problem Relation Age of Onset  . Pancreatic cancer Mother   . Bone cancer Father   . Alzheimer's disease Sister   . Heart attack Paternal Uncle   . Arthritis Maternal Grandfather   . Alzheimer's disease Paternal Grandmother   . Lung cancer Paternal Grandfather      Current Outpatient Medications:  .  acetaminophen (TYLENOL) 500 MG tablet, Take 500 mg by mouth every 6 (six) hours as needed., Disp: , Rfl:  .  aspirin 81 MG chewable tablet, Chew by mouth daily., Disp: , Rfl:  .  BYSTOLIC 5 MG tablet, Take 5 mg by mouth daily. , Disp: , Rfl:  .  clopidogrel (PLAVIX) 75 MG tablet, TAKE ONE TABLET BY MOUTH EVERY MORNING WITH BREAKFAST, Disp: 90 tablet, Rfl: 2 .  Ferrous Sulfate (IRON PO), Take 300 mg by mouth., Disp: , Rfl:  .  ketoconazole (NIZORAL) 2 % cream, , Disp: , Rfl:  .  leflunomide (ARAVA) 10 MG tablet, Take 1 tablet by mouth daily., Disp: , Rfl:  .  lisinopril (ZESTRIL) 10 MG tablet, Take 1 tablet (10 mg total) by mouth daily. *NEEDS OFFICE VISIT FOR FURTHER REFILLS*, Disp: 90 tablet, Rfl: 0 .  magnesium oxide (MAG-OX) 400 MG tablet, Take 1 tablet by mouth daily., Disp: , Rfl:  .  metFORMIN (GLUCOPHAGE) 500 MG tablet, Take 500 mg by mouth 2 (two) times daily., Disp: , Rfl:  .  nitroGLYCERIN (NITROSTAT) 0.4 MG SL tablet, DISSOLVE 1 TABLET UNDER THE TONGUE EVERY 5 MINUTES AS NEEDED FOR CHEST PAIN., Disp: 75 tablet, Rfl: 1 .  Omega-3 Fatty Acids (OMEGA 3 PO), Take 1 tablet 2 (two) times daily by mouth., Disp: , Rfl:  .  pravastatin (PRAVACHOL) 20 MG tablet, Take 1 tablet (20  mg total) by mouth every evening. *NEEDS OFFICE VISIT FOR FURTHER REFILLS*, Disp: 90 tablet, Rfl: 0 .  terbinafine (LAMISIL) 250 MG tablet, Take by mouth., Disp: , Rfl:  .  thyroid  (ARMOUR) 90 MG tablet, Take 90 mg by mouth every morning. , Disp: , Rfl:  .  trospium (SANCTURA) 20 MG tablet, Take 20 mg by mouth 2 (two) times daily., Disp: , Rfl:  .  UNABLE TO FIND, 500 mg. Med Name: Hemp, Disp: , Rfl:  No current facility-administered medications for this visit.  Facility-Administered Medications Ordered in Other Visits:  .  ondansetron (ZOFRAN) 4 mg in sodium chloride 0.9 % 50 mL IVPB, 4 mg, Intravenous, Q6H PRN, Ashok Pall, MD  Allergies  Allergen Reactions  . Celecoxib Rash    Skin rash        Objective:   Vitals:   06/01/19 1044  BP: 130/72  Pulse: 66  SpO2: 99%  Weight: 194 lb 12.8 oz (88.4 kg)  Height: 5' 7" (1.702 m)    Estimated body mass index is 30.51 kg/m as calculated from the following:   Height as of this encounter: 5' 7" (1.702 m).   Weight as of this encounter: 194 lb 12.8 oz (88.4 kg).  _0 @  Filed Weights   06/01/19 1044  Weight: 194 lb 12.8 oz (88.4 kg)     Physical Exam  General Appearance:    Alert, cooperative, no distress, appears stated age - yes , Deconditioned looking - no , OBESE  - no, Sitting on Wheelchair -  no  Head:    Normocephalic, without obvious abnormality, atraumatic  Eyes:    PERRL, conjunctiva/corneas clear,  Ears:    Normal TM's and external ear canals, both ears  Nose:   Nares normal, septum midline, mucosa normal, no drainage    or sinus tenderness. OXYGEN ON  - no . Patient is @ ra   Throat:   Lips, mucosa, and tongue normal; teeth and gums normal. Cyanosis on lips - no  Neck:   Supple, symmetrical, trachea midline, no adenopathy;    thyroid:  no enlargement/tenderness/nodules; no carotid   bruit or JVD  Back:     Symmetric, no curvature, ROM normal, no CVA tenderness  Lungs:     Distress - no , Wheeze no, Barrell Chest - no, Purse lip breathing - no, Crackles - no   Chest Wall:    No tenderness or deformity.    Heart:    Regular rate and rhythm, S1 and S2 normal, no rub   or gallop,  Murmur - no  Breast Exam:    NOT DONE  Abdomen:     Soft, non-tender, bowel sounds active all four quadrants,    no masses, no organomegaly. Visceral obesity - yes  Genitalia:   NOT DONE  Rectal:   NOT DONE  Extremities:   Extremities - normal, Has Cane - no, Clubbing - no, Edema - no  Pulses:   2+ and symmetric all extremities  Skin:   Stigmata of Connective Tissue Disease - no  Lymph nodes:   Cervical, supraclavicular, and axillary nodes normal  Psychiatric:  Neurologic:   Pleasant - yes, Anxious - no, Flat affect - no  CAm-ICU - neg, Alert and Oriented x 3 - yes, Moves all 4s - yes, Speech - normal, Cognition - intact           Assessment:       ICD-10-CM   1. Dyspnea on exertion  R06.00  2. Interstitial lung disease due to connective tissue disease ([place])  J84.89    M35.9   3. 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 -However, now it is worse -I do not think this is because your lung disease [pulmonary fibrosis] is worse.  In fact, I think your lung disease is stable based on August 2020 CT scan of the chest and breathing test.  Plan  = Please check with [name] if any heart issues might be contributing to worsening shortness of breath -Meanwhile we will get a breathing test spirometry and DLCO first available in the next few months  -If indeed this test is stable then we will get a pulmonary stress test  -If this test shows your pulmonary fibrosis might be getting worse we will get a high-resolution CT chest   Followup  - 15 min telephine visit after spirometry and dlco test  - to review result and  discuss nexdt steps  ( Level 02 visit: 15-29 min visit spent in   in-site  in total care time and counseling or/and coordination of care by this undersigned MD - Dr Brand Males. This includes one or more of the following for care delivered on 06/01/2019 same day: pre-charting, chart review, note writing, documentation discussion of test results, diagnostic or treatment recommendations, prognosis, risks and benefits of management options, instructions, education, compliance or risk-factor reduction. It excludes time spent by the [place] or office staff in the care of the patient)    SIGNATURE    Dr. Brand Males, M.D., F.C.C.P,  Pulmonary and Critical Care Medicine Staff Physician, [place] Director - Interstitial Lung Disease  Program  Pulmonary [place] at [place], [place], [address]  Pager: [phone], If no answer or between  15:00h - 7:00h: call [phone] Telephone: [phone]  11:25 AM 06/01/2019

## 2019-06-01 NOTE — Progress Notes (Signed)
PPM Remote
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
## 2019-06-03 ENCOUNTER — Other Ambulatory Visit (INDEPENDENT_AMBULATORY_CARE_PROVIDER_SITE_OTHER): Payer: Medicare Other

## 2019-06-03 ENCOUNTER — Encounter: Payer: Self-pay | Admitting: Physician Assistant

## 2019-06-03 DIAGNOSIS — I442 Atrioventricular block, complete: Secondary | ICD-10-CM

## 2019-06-03 DIAGNOSIS — R0609 Other forms of dyspnea: Secondary | ICD-10-CM

## 2019-06-03 DIAGNOSIS — I2510 Atherosclerotic heart disease of native coronary artery without angina pectoris: Secondary | ICD-10-CM

## 2019-06-03 DIAGNOSIS — I10 Essential (primary) hypertension: Secondary | ICD-10-CM

## 2019-06-07 ENCOUNTER — Other Ambulatory Visit: Payer: Self-pay

## 2019-06-07 ENCOUNTER — Ambulatory Visit (HOSPITAL_COMMUNITY): Payer: Medicare Other | Attending: Cardiology

## 2019-06-07 DIAGNOSIS — R0600 Dyspnea, unspecified: Secondary | ICD-10-CM | POA: Diagnosis present

## 2019-06-13 ENCOUNTER — Ambulatory Visit: Payer: Medicare Other | Admitting: Cardiovascular Disease

## 2019-06-27 ENCOUNTER — Ambulatory Visit: Admit: 2019-06-27 | Payer: Medicare Other | Admitting: Internal Medicine

## 2019-06-27 SURGERY — ESOPHAGOGASTRODUODENOSCOPY (EGD) WITH PROPOFOL
Anesthesia: General

## 2019-07-05 ENCOUNTER — Telehealth: Payer: Self-pay

## 2019-07-05 ENCOUNTER — Telehealth (INDEPENDENT_AMBULATORY_CARE_PROVIDER_SITE_OTHER): Payer: Medicare Other | Admitting: Cardiovascular Disease

## 2019-07-05 VITALS — BP 165/88 | Ht 67.0 in | Wt 190.0 lb

## 2019-07-05 DIAGNOSIS — E039 Hypothyroidism, unspecified: Secondary | ICD-10-CM

## 2019-07-05 DIAGNOSIS — Z950 Presence of cardiac pacemaker: Secondary | ICD-10-CM

## 2019-07-05 DIAGNOSIS — I480 Paroxysmal atrial fibrillation: Secondary | ICD-10-CM | POA: Diagnosis not present

## 2019-07-05 DIAGNOSIS — I10 Essential (primary) hypertension: Secondary | ICD-10-CM

## 2019-07-05 DIAGNOSIS — I2510 Atherosclerotic heart disease of native coronary artery without angina pectoris: Secondary | ICD-10-CM | POA: Diagnosis not present

## 2019-07-05 DIAGNOSIS — G4733 Obstructive sleep apnea (adult) (pediatric): Secondary | ICD-10-CM

## 2019-07-05 MED ORDER — LISINOPRIL 20 MG PO TABS: 20.0000 mg | ORAL_TABLET | Freq: Every day | ORAL | 3 refills | Status: DC

## 2019-07-05 NOTE — Patient Instructions (Signed)
Medication Instructions:  INCREASE YOUR LISINOPRIL TO 20MG DAILY *If you need a refill on your cardiac medications before your next appointment, please call your pharmacy*  Lab Work: [place], BMET If you have labs (blood work) drawn today and your tests are completely normal, you will receive your results only by: Marland Kitchen MyChart Message (if you have MyChart) OR . A paper copy in the mail If you have any lab test that is abnormal or we need to change your treatment, we will call you to review the results.    Follow-Up: At [facility], you and your health needs are our priority.  As part of our continuing mission to provide you with exceptional heart care, we have created designated Provider Care Teams.  These Care Teams include your primary Cardiologist (physician) and Advanced Practice Providers (APPs -  Physician Assistants and Nurse Practitioners) who all work together to provide you with the care you need, when you need it.  Your next appointment:   2 month(s)  The format for your next appointment:   In Person  Provider:   [name], PA-C  Other Instructions AFTER APPOINTMENT WITH HAO MENG, PA-C PLEASE FOLLOW UP IN [place]

## 2019-07-05 NOTE — Telephone Encounter (Signed)
Attempted to call home phone number, mailbox full.  LVM on wife's cell phone for pt to call back.

## 2019-07-05 NOTE — Telephone Encounter (Signed)
Spoke with patient and reviewed AVS from today's virtual visit. Patient agreeable and had no further questions. Copy of AVS and lab slip mailed to pt.
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
## 2019-07-07 ENCOUNTER — Encounter: Payer: Self-pay | Admitting: Cardiovascular Disease

## 2019-07-14 LAB — BASIC METABOLIC PANEL
BUN/Creatinine Ratio: 21 (ref 10–24)
BUN: 26 mg/dL (ref 8–27)
CO2: 26 mmol/L (ref 20–29)
Calcium: 10.2 mg/dL (ref 8.6–10.2)
Chloride: 103 mmol/L (ref 96–106)
Creatinine, Ser: 1.23 mg/dL (ref 0.76–1.27)
GFR calc Af Amer: 64 mL/min/{1.73_m2} (ref >59)
GFR calc non Af Amer: 55 mL/min/{1.73_m2} — ABNORMAL LOW (ref >59)
Glucose: 118 mg/dL — ABNORMAL HIGH (ref 65–99)
Potassium: 5.5 mmol/L — ABNORMAL HIGH (ref 3.5–5.2)
Sodium: 145 mmol/L — ABNORMAL HIGH (ref 134–144)

## 2019-07-19 ENCOUNTER — Other Ambulatory Visit
Admission: RE | Admit: 2019-07-19 | Discharge: 2019-07-19 | Disposition: A | Discharge status: Home / Self Care | Payer: Medicare Other | Source: Ambulatory Visit | Attending: Internal Medicine | Admitting: Internal Medicine

## 2019-07-19 ENCOUNTER — Other Ambulatory Visit: Payer: Self-pay

## 2019-07-19 DIAGNOSIS — Z20822 Contact with and (suspected) exposure to covid-19: Secondary | ICD-10-CM | POA: Insufficient documentation

## 2019-07-19 DIAGNOSIS — Z01812 Encounter for preprocedural laboratory examination: Secondary | ICD-10-CM | POA: Insufficient documentation

## 2019-07-19 NOTE — Addendum Note (Signed)
Addended by: Ena Dawley on: 07/19/2019 03:31 PM   Modules accepted: Orders

## 2019-07-20 LAB — SARS CORONAVIRUS 2 (TAT 6-24 HRS): SARS Coronavirus 2: NEGATIVE

## 2019-07-21 ENCOUNTER — Ambulatory Visit (INDEPENDENT_AMBULATORY_CARE_PROVIDER_SITE_OTHER): Payer: Medicare Other | Admitting: Internal Medicine

## 2019-07-21 ENCOUNTER — Other Ambulatory Visit: Payer: Self-pay

## 2019-07-21 ENCOUNTER — Encounter: Payer: Self-pay | Admitting: *Deleted

## 2019-07-21 DIAGNOSIS — J8489 Other specified interstitial pulmonary diseases: Secondary | ICD-10-CM

## 2019-07-21 DIAGNOSIS — R0609 Other forms of dyspnea: Secondary | ICD-10-CM

## 2019-07-21 DIAGNOSIS — M359 Systemic involvement of connective tissue, unspecified: Secondary | ICD-10-CM | POA: Diagnosis not present

## 2019-07-21 DIAGNOSIS — I2729 Other secondary pulmonary hypertension: Secondary | ICD-10-CM

## 2019-07-21 LAB — PULMONARY FUNCTION TEST
DL/VA % pred: 112 %
DL/VA: 4.47 ml/min/mmHg/L
DLCO cor % pred: 89 %
DLCO cor: 20.08 ml/min/mmHg
DLCO unc % pred: 89 %
DLCO unc: 20.08 ml/min/mmHg
FEF 25-75 Pre: 2.64 L/sec
FEF2575-%Pred-Pre: 150 %
FEV1-%Pred-Pre: 94 %
FEV1-Pre: 2.4 L
FEV1FVC-%Pred-Pre: 114 %
FEV6-%Pred-Pre: 87 %
FEV6-Pre: 2.91 L
FEV6FVC-%Pred-Pre: 107 %
FVC-%Pred-Pre: 81 %
FVC-Pre: 2.92 L
Pre FEV1/FVC ratio: 82 %
Pre FEV6/FVC Ratio: 99 %

## 2019-07-21 NOTE — Progress Notes (Signed)
Spiro/DLCO performed today. 

## 2019-07-22 LAB — BASIC METABOLIC PANEL
BUN/Creatinine Ratio: 21 (ref 10–24)
BUN: 26 mg/dL (ref 8–27)
CO2: 24 mmol/L (ref 20–29)
Calcium: 9.7 mg/dL (ref 8.6–10.2)
Chloride: 105 mmol/L (ref 96–106)
Creatinine, Ser: 1.25 mg/dL (ref 0.76–1.27)
GFR calc Af Amer: 63 mL/min/{1.73_m2} (ref >59)
GFR calc non Af Amer: 54 mL/min/{1.73_m2} — ABNORMAL LOW (ref >59)
Glucose: 149 mg/dL — ABNORMAL HIGH (ref 65–99)
Potassium: 5.5 mmol/L — ABNORMAL HIGH (ref 3.5–5.2)
Sodium: 144 mmol/L (ref 134–144)

## 2019-07-25 ENCOUNTER — Other Ambulatory Visit: Payer: Self-pay

## 2019-07-25 DIAGNOSIS — Z79899 Other long term (current) drug therapy: Secondary | ICD-10-CM

## 2019-07-25 DIAGNOSIS — I10 Essential (primary) hypertension: Secondary | ICD-10-CM

## 2019-08-01 ENCOUNTER — Other Ambulatory Visit: Payer: Self-pay

## 2019-08-01 ENCOUNTER — Ambulatory Visit (INDEPENDENT_AMBULATORY_CARE_PROVIDER_SITE_OTHER): Payer: Medicare Other | Admitting: Internal Medicine

## 2019-08-01 DIAGNOSIS — J8489 Other specified interstitial pulmonary diseases: Secondary | ICD-10-CM

## 2019-08-01 DIAGNOSIS — I2510 Atherosclerotic heart disease of native coronary artery without angina pectoris: Secondary | ICD-10-CM

## 2019-08-01 DIAGNOSIS — M359 Systemic involvement of connective tissue, unspecified: Secondary | ICD-10-CM

## 2019-08-01 DIAGNOSIS — J849 Interstitial pulmonary disease, unspecified: Secondary | ICD-10-CM | POA: Diagnosis not present

## 2019-08-01 LAB — BASIC METABOLIC PANEL
BUN/Creatinine Ratio: 25 — ABNORMAL HIGH (ref 10–24)
BUN: 26 mg/dL (ref 8–27)
CO2: 25 mmol/L (ref 20–29)
Calcium: 10.1 mg/dL (ref 8.6–10.2)
Chloride: 103 mmol/L (ref 96–106)
Creatinine, Ser: 1.04 mg/dL (ref 0.76–1.27)
GFR calc Af Amer: 79 mL/min/{1.73_m2} (ref >59)
GFR calc non Af Amer: 68 mL/min/{1.73_m2} (ref >59)
Glucose: 200 mg/dL — ABNORMAL HIGH (ref 65–99)
Potassium: 6.3 mmol/L (ref 3.5–5.2)
Sodium: 139 mmol/L (ref 134–144)

## 2019-08-01 NOTE — Patient Instructions (Addendum)
Dyspnea on exertion ILD (interstitial lung disease) (HCC)-confirmed in August 2020 CT chest History of rheumatoid arthritis Other secondary pulmonary hypertension ([place]) - - this was mild on right heart cath spring 2019   - Breathing test might be worsening - suggesting worsening fibrosis but glad you feel roughtly the same -Overall shortness of breath itself is coming from the heart and lung issues  Plan -  -Do repeat spirometry and DLCO in 2-3 months = Do high-resolution CT chest supine and prone in 2-3 months -Meanwhile continue optimal cardiac care and exercise program  Follow-up -In 2-3 months and a 30-minute face-to-face slot in ILD clinic or any other day with [name] -  -If there is evidence of continued decline then we need to consider antifibrotic therapy  -Return sooner if needed

## 2019-08-01 NOTE — Progress Notes (Signed)
PCP [name], MD  HPI   IOV 07/10/2017  Chief Complaint  Patient presents with  . Advice Only    Referred by [facility] due to SOB.  PFT done 05/28/17.  Pt has been having issues with SOB x4 months especially with exertion and has some mild chest tightness. Denies any cough.    79 year old male referred by [name] for evaluation of shortness of breath after cardiac etiologies ruled out.  He tells me that he is a remote smoker.  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  He has a long-standing history of rheumatoid arthritis followed by Dr. Jefm Bryant in [place].  He is to be on methotrexate for many years and stopped taking it because of cardiac dysfunction [he personally is convinced that methotrexate because this].  He was then on leflunomide as of 2017 but is currently not on it.  His last rheumatoid factor and CCP antibodies were negative on my personal chart review of the outside records in 2016.    Now for the last 3 or 6 months he is got insidious onset of shortness of breath that is slowly progressive.  His dyspnea on exertion relieved by rest.  Class II-3 activities.  There I  s no associated cough or orthopnea proximal nocturnal dyspnea.  He did have some edema but this got cleared up but the dyspnea is continuing to get worse.  In the last few months he has had a cardiac echo that showed pulmonary hypertension.  Did have cardiac stress test that is normal.  Had pulmonary function test that showed isolated reduction in diffusion capacity and therefore he has been referred here.  Walking desaturation test on 07/10/2017 185 feet x 3 laps on ROOM AIR:  did NOT desaturate. Rest pulse ox was 100%, final pulse ox was 98%. HR response 60/min at rest to 121/min at peak exertion. Patient Cameron [name]  Did not Desaturate < 88% . Cameron [name] did not  Desaturated  </= 3% points. [name] yes did get tachyardic   OV 08/04/2017  Chief Complaint  Patient presents with  . Follow-up    ILD    Follow-up interstitial lung disease workup  After the last visit no interim problems.  He has some chronic pedal edema that he will talk to about with his primary care physician.  He did see Dr. Jefm Bryant rheumatologist in July 15, 2017.  I reviewed his notes.  It is not specifically indicate patient has nonspecific seronegative arthritis with a differential diagnosis of seronegative rheumatoid arthritis versus psoriatic.  Patient still seems to think the root of all his problems is the methotrexate he took for over 10 years.  At this point in time there is no decompensation.  As part of the ILD workup his pulmonary function test shows mild reduction in diffusion capacity.  Correlating with this is evidence of pulmonary hypertension on the echo and high-resolution CT chest enlarged pulmonary arteries.  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  His autoimmune panel is negative.  And so the vasculitis panel and hypersensitivity pneumonitis panel.    IMPRESSION: 1. 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 Early/mild usual interstitial pneumonitis is not excluded. 2. Aortic atherosclerosis (ICD10-170.0). Coronary artery calcification. 3. Enlarged pulmonary arteries, indicative of pulmonary arterial Hypertension.- > in echo  Nov 2018: Pulmonary arteries: Systolic pressure was moderately increased.   PA peak pressure: 55 mm Hg (S). 4. Left renal stone, partially imaged.   Electronically Signed   By: Lorin [name] M.D.   On: 07/22/2017 15:03  OV 11/03/2017  Chief Complaint  Patient presents with  . Follow-up    Pt has SOB  with exertion, some dry cough.     Follow-up suspected interstitial lung disease in the setting of rheumatoid arthritis and long methotrexate intake  He presents with his wife.  At the time of last visit I was not fully convinced that he had interstitial lung disease.  His CT scan indicated presence of pulmonary hypertension and so did his echocardiogram.  Therefore I referred him back to [name] cardiology.  In the spring 2019 he did have a right heart catheterization and left heart catheterization that showed mild pulmonary hypertension but also coronary artery blockage.  He status post 2 stents.  After that his shortness of breath improved but he tells me overall his fatigue level has not improved.  In talking to him I find out that he exercises 5 times a week doing heart track 2 times a week and the other 3 days walking a mile each time and doing weight lifting.  It appears that he takes a 20-minute nap after these exercises and then feels reenergized.  His mother feels that he does not have the effort tolerance as his younger days but he denies having any symptoms of chest pain or shortness of breath or cough when he does these heavy exertion the weight lifting or walking a mile out doing heart track.  In fact in the walking desaturation test today he walked extremely fast and had no problems.  In terms of his possible interstitial lung disease he had pulmonary function test today and felt to show some improvement and on exam he does not have any crackles.  Also his wife tells me that for the last 2 months he is using CPAP for sleep apnea and this is also helping him.      OV 06/01/2019  Subjective:  [name], male , DOB: 02/05/1941 , age 13 y.o. , MRN: [id number] , ADDRESS: [phone] [place] [address]   06/01/2019 -   Chief Complaint  Patient presents with  . Follow-up    Pt states he has been doing okay since last visit but states he has been having a little more  SOB x4 weeks now. Pt also has occ coughing with yellow phlegm.   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 -returns for follow-up.  I personally have not seen him since the summer 2019.  He says overall he has been stable.  In August 2020 he had a CT scan of the chest that confirmed the presence of interstitial lung disease in the setting of his rheumatoid arthritis.  He was stable.  His pulmonary function test was stable.  He says now in the last 2 months has had a decline in shortness of breath.  There is also some cough with sputum production but that has resolved.  It is definitely present with exertion but relieved by rest.  His walking desaturation test compared  to 18 months ago is roughly the same except that he is very tachycardic.  He does have a pacemaker.  He has an appoint with [name] his electrophysiologist today.  His symptom scores are listed below.  SYMPTOM SCALE - ILD 06/01/2019   O2 use no  Shortness of Breath 0 -> 5 scale with 5 being worst (score 6 If unable to do)  At rest 0  Simple tasks - showers, clothes change, eating, shaving 0  Household (dishes, doing bed, laundry) 0  Shopping 0  Walking level at own pace 2  Walking keeping up with others of same age 45  Walking up Stairs 3  Walking up [place] 3  Total (40 - 48) Dyspnea Score 10  How bad is your cough? 1  How bad is your fatigue 2       ROS - per HPI     OV 08/01/2019 - telephine visit - identified with 2 person identifier. Telephone visit - limits, risks benefits explained  Subjective:  [name], male , DOB: [birth year] , age 89 y.o. 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 - similar dyspnea compared to JAn 2021.  No better  nor worse.  After last visit he has seen cardiology x2.  It appears the final conclusion is that diastolic dysfunction might be contributing to his shortness of breath.  But overall not major changes in his cardiac care.  He tells me that overall he is stable.  He had spirometry and DLCO the DLCO itself appears to be reduced compared to the recent 1 but stable compared to older ones.  The FVC suggest decline.  Patient himself feels stable.  Overall some mixed picture.  Last high-resolution CT chest was October 2020.   Simple office walk 185 feet x  3 laps goal with forehead probe 11/03/2017  06/01/2019   O2 used Room air Room air  Number laps completed 3 3  Comments about pace Fast very  99% and 66  Resting Pulse Ox/HR 100% and 70/min 99% and 66/min  Final Pulse Ox/HR 98% and 121/min 98% and 120 min  Desaturated </= 88% no   Desaturated <= 3% points no   Got Tachycardic >/= 90/min yes   Symptoms at end of test none Mild dyspea  Miscellaneous comments veryu fast Mod pace   Results for ARYE, WEYENBERG" (MRN [id number]) as of 08/01/2019 12:11  Ref. Range 05/28/2017 13:28 11/03/2017 14:10 01/17/2019 12:39 07/21/2019 15:53  FVC-Pre Latest Units: L 3.29 3.59 3.41 2.92  FVC-%Pred-Pre Latest Units: % 90 98 94 81   Results for JERMINE, BIBBEE A "[name]" (MRN [id number]) as of 08/01/2019 12:11  Ref. Range 05/28/2017 13:28 11/03/2017 14:10 01/17/2019 12:39 07/21/2019 15:53  DLCO unc Latest Units: ml/min/mmHg 20.35 20.97 21.58 20.08  DLCO unc % pred Latest Units: % 71 74 96 89     IMPRESSION: 1. 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 There is no significant air trapping on expiratory phase imaging. Findings are not significantly changed compared to prior examinations and remain in an "indeterminate for UIP" pattern by ATS pulmonary  fibrosis criteria, differential considerations including both UIP and NSIP.  2.  Coronary  artery disease and aortic atherosclerosis.  3.  Left nephrolithiasis.   Electronically Signed   By: Eddie Candle M.D.   On: 01/12/2019 15:08   ROS - per HPI     has a past medical history of Appendicitis, Atrial fibrillation ([place]) (12/12/2013), BPH (benign prostatic hypertrophy), CAD (coronary artery disease) (12/2015), Chest pain (12/03/2015), CHF (congestive heart failure) (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   reports that he quit smoking about 47 years ago. His smoking use included cigarettes. He has a 5.00 pack-year smoking history. He quit smokeless tobacco use about 46 years ago.  His smokeless tobacco use included chew.  Past Surgical History:  Procedure Laterality Date  . BACK SURGERY    . CARDIAC CATHETERIZATION N/A 12/28/2015   Procedure: Left Heart Cath and Coronary Angiography;  Surgeon: Belva Crome, MD;  Location: [facility];  Service: Cardiovascular;  Laterality: N/A;  . CATARACT EXTRACTION W/ INTRAOCULAR LENS  IMPLANT, BILATERAL Bilateral   . CORONARY ANGIOPLASTY WITH STENT PLACEMENT  08/11/2017   "2 stents"  . CORONARY STENT INTERVENTION N/A 08/11/2017   Procedure: CORONARY STENT INTERVENTION;  Surgeon: Troy Sine, MD;  Location: [facility];  Service: Cardiovascular;   Laterality: N/A;  . CYSTOSCOPY W/ STONE MANIPULATION    . INGUINAL HERNIA REPAIR Left   . INSERT / REPLACE / REMOVE PACEMAKER  12/09/2011   SJM Accent DR RF implanted by DR Allred for complete heart block and syncope  . JOINT REPLACEMENT    . LAPAROSCOPIC CHOLECYSTECTOMY    . LITHOTRIPSY    . LUMBAR DISC SURGERY     "removed arthritis and spurs"  . PERMANENT PACEMAKER INSERTION N/A 12/09/2011   Procedure: PERMANENT PACEMAKER INSERTION;  Surgeon: [name], MD;  Location[facility];  Service: Cardiovascular;  Laterality: N/A;  . REPLACEMENT TOTAL KNEE Right   . RIGHT/LEFT HEART CATH AND CORONARY ANGIOGRAPHY N/A 08/11/2017   Procedure: RIGHT/LEFT HEART CATH AND CORONARY ANGIOGRAPHY;  Surgeon: Larey Dresser, MD;  Location: [facility];  Service: Cardiovascular;  Laterality: N/A;  . TRANSURETHRAL RESECTION OF PROSTATE  2017/2018    Allergies  Allergen Reactions  . Celecoxib Rash    Skin rash     Immunization History  Administered Date(s) Administered  . Influenza, High Dose Seasonal PF 03/26/2017  . Influenza-Unspecified 05/03/2015, 02/13/2016, 03/26/2016, 03/19/2017, 03/03/2018, 03/17/2019  . Pneumococcal Polysaccharide-23 12/10/2011, 01/30/2018  . Pneumococcal-Unspecified 03/26/2016, 02/04/2018  . Tdap 01/17/2016, 02/04/2018    Family History  Problem Relation Age of Onset  . Pancreatic cancer Mother   . Bone cancer Father   . Alzheimer's disease Sister   . Heart attack Paternal Uncle   . Arthritis Maternal Grandfather   . Alzheimer's disease Paternal Grandmother   . Lung cancer Paternal Grandfather      Current Outpatient Medications:  .  acetaminophen (TYLENOL) 500 MG tablet, Take 500 mg by mouth every 6 (six) hours as needed., Disp: , Rfl:  .  aspirin 81 MG chewable tablet, Chew by mouth daily., Disp: , Rfl:  .  BYSTOLIC 10 MG tablet, Take 10 mg by  mouth daily. , Disp: , Rfl:  .  clopidogrel (PLAVIX) 75 MG tablet, TAKE ONE TABLET BY MOUTH EVERY MORNING  WITH BREAKFAST, Disp: 90 tablet, Rfl: 2 .  Ferrous Sulfate (IRON PO), Take 300 mg by mouth., Disp: , Rfl:  .  furosemide (LASIX) 20 MG tablet, Take 1 tablet (20 mg total) by mouth as needed (SWELLING IN LEG)., Disp: 30 tablet, Rfl: 1 .  ketoconazole (NIZORAL) 2 % cream, , Disp: , Rfl:  .  leflunomide (ARAVA) 10 MG tablet, Take 1 tablet by mouth daily., Disp: , Rfl:  .  lisinopril (ZESTRIL) 20 MG tablet, Take 1 tablet (20 mg total) by mouth daily. *NEEDS OFFICE VISIT FOR FURTHER REFILLS*, Disp: 90 tablet, Rfl: 3 .  magnesium oxide (MAG-OX) 400 MG tablet, Take 1 tablet by mouth daily., Disp: , Rfl:  .  metFORMIN (GLUCOPHAGE) 500 MG tablet, Take 500 mg by mouth 2 (two) times daily., Disp: , Rfl:  .  nitroGLYCERIN (NITROSTAT) 0.4 MG SL tablet, DISSOLVE 1 TABLET UNDER THE TONGUE EVERY 5 MINUTES AS NEEDED FOR CHEST PAIN., Disp: 75 tablet, Rfl: 1 .  Omega-3 Fatty Acids (OMEGA 3 PO), Take 1 tablet 2 (two) times daily by mouth., Disp: , Rfl:  .  pravastatin (PRAVACHOL) 20 MG tablet, Take 1 tablet (20 mg total) by mouth every evening. *NEEDS OFFICE VISIT FOR FURTHER REFILLS*, Disp: 90 tablet, Rfl: 0 .  terbinafine (LAMISIL) 250 MG tablet, Take by mouth., Disp: , Rfl:  .  thyroid (ARMOUR) 90 MG tablet, Take 90 mg by mouth every morning. , Disp: , Rfl:  .  trospium (SANCTURA) 20 MG tablet, Take 20 mg by mouth 2 (two) times daily., Disp: , Rfl:  No current facility-administered medications for this visit.  Facility-Administered Medications Ordered in Other Visits:  .  ondansetron (ZOFRAN) 4 mg in sodium chloride 0.9 % 50 mL IVPB, 4 mg, Intravenous, Q6H PRN, Ashok Pall, MD      Objective:   There were no vitals filed for this visit.  Estimated body mass index is 29.76 kg/m as calculated from the following:   Height as of 07/05/19: _0  (1.702 m).   Weight as of 07/05/19: 190 lb (86.2 kg).  _1 @  There were no vitals filed for this visit.   Physical Exam Sounded normal on the  telephone      Assessment:     No diagnosis found.     Plan:     Patient Instructions  Dyspnea on exertion ILD (interstitial lung disease) (HCC)-confirmed in August 2020 CT chest History of rheumatoid arthritis Other secondary pulmonary hypertension (HCC) - - this was mild on right heart cath spring 2019   - Breathing test might be worsening - suggesting worsening fibrosis but glad you feel roughtly the same -Overall shortness of breath itself is coming from the heart and lung issues  Plan -  -Do repeat spirometry and DLCO in 2-3 months = Do high-resolution CT chest supine and prone in 2-3 months -Meanwhile continue optimal cardiac care and exercise program  Follow-up -In 2-3 months and a 30-minute face-to-face slot in ILD clinic or any other day with [name] -  -If there is evidence of continued decline then we need to consider antifibrotic therapy  -Return sooner if needed    SIGNATURE    Dr. Brand Males, M.D., F.C.C.P,  Pulmonary and Critical Care Medicine Staff Physician, [place] Director - Interstitial Lung Disease  Program  Pulmonary [place]  at [facility], [place], [address]  Pager: [phone], If no answer or between  15:00h - 7:00h: call [phone] Telephone: [phone]  12:20 PM 08/01/2019

## 2019-08-01 NOTE — Addendum Note (Signed)
Addended by: Lorretta Harp on: 08/01/2019 01:16 PM   Modules accepted: Orders

## 2019-08-02 ENCOUNTER — Emergency Department
Admission: EM | Admit: 2019-08-02 | Discharge: 2019-08-02 | Disposition: A | Discharge status: Home / Self Care | Payer: Medicare Other | Attending: Emergency Medicine | Admitting: Emergency Medicine

## 2019-08-02 ENCOUNTER — Telehealth: Payer: Self-pay | Admitting: Internal Medicine

## 2019-08-02 ENCOUNTER — Other Ambulatory Visit: Payer: Self-pay

## 2019-08-02 ENCOUNTER — Encounter: Payer: Self-pay | Admitting: Emergency Medicine

## 2019-08-02 DIAGNOSIS — E119 Type 2 diabetes mellitus without complications: Secondary | ICD-10-CM | POA: Insufficient documentation

## 2019-08-02 DIAGNOSIS — E875 Hyperkalemia: Secondary | ICD-10-CM | POA: Diagnosis not present

## 2019-08-02 DIAGNOSIS — I509 Heart failure, unspecified: Secondary | ICD-10-CM | POA: Insufficient documentation

## 2019-08-02 DIAGNOSIS — I4891 Unspecified atrial fibrillation: Secondary | ICD-10-CM | POA: Diagnosis not present

## 2019-08-02 DIAGNOSIS — Z79899 Other long term (current) drug therapy: Secondary | ICD-10-CM | POA: Diagnosis not present

## 2019-08-02 DIAGNOSIS — I2510 Atherosclerotic heart disease of native coronary artery without angina pectoris: Secondary | ICD-10-CM | POA: Diagnosis not present

## 2019-08-02 DIAGNOSIS — I110 Hypertensive heart disease with heart failure: Secondary | ICD-10-CM | POA: Diagnosis not present

## 2019-08-02 DIAGNOSIS — Z7902 Long term (current) use of antithrombotics/antiplatelets: Secondary | ICD-10-CM | POA: Insufficient documentation

## 2019-08-02 DIAGNOSIS — Z7984 Long term (current) use of oral hypoglycemic drugs: Secondary | ICD-10-CM | POA: Diagnosis not present

## 2019-08-02 DIAGNOSIS — Z950 Presence of cardiac pacemaker: Secondary | ICD-10-CM | POA: Diagnosis not present

## 2019-08-02 DIAGNOSIS — Z9861 Coronary angioplasty status: Secondary | ICD-10-CM | POA: Diagnosis not present

## 2019-08-02 LAB — BASIC METABOLIC PANEL
Anion gap: 7 (ref 5–15)
BUN: 28 mg/dL — ABNORMAL HIGH (ref 8–23)
CO2: 26 mmol/L (ref 22–32)
Calcium: 9.4 mg/dL (ref 8.9–10.3)
Chloride: 104 mmol/L (ref 98–111)
Creatinine, Ser: 1.26 mg/dL — ABNORMAL HIGH (ref 0.61–1.24)
GFR calc Af Amer: >60 mL/min (ref >60)
GFR calc non Af Amer: 54 mL/min — ABNORMAL LOW (ref >60)
Glucose, Bld: 70 mg/dL (ref 70–99)
Potassium: 5.2 mmol/L — ABNORMAL HIGH (ref 3.5–5.1)
Sodium: 137 mmol/L (ref 135–145)

## 2019-08-02 LAB — CBC
HCT: 40.7 % (ref 39.0–52.0)
Hemoglobin: 13.4 g/dL (ref 13.0–17.0)
MCH: 30.5 pg (ref 26.0–34.0)
MCHC: 32.9 g/dL (ref 30.0–36.0)
MCV: 92.7 fL (ref 80.0–100.0)
Platelets: 188 10*3/uL (ref 150–400)
RBC: 4.39 MIL/uL (ref 4.22–5.81)
RDW: 13.3 % (ref 11.5–15.5)
WBC: 9.2 10*3/uL (ref 4.0–10.5)
nRBC: 0 % (ref 0.0–0.2)

## 2019-08-02 NOTE — Telephone Encounter (Signed)
New Message   Patient's wife is calling in to make [name] and his nurse aware that patient had to have blood work done this morning at Dr. [name] office today in [place] and the blood work is showing a significant issue with patient's glucose level and his potassium level. Patient's wife would like the results of the blood work looked over by [name] and would like a call back to discuss. Please give patient's wife a call back.

## 2019-08-02 NOTE — ED Provider Notes (Signed)
[facility] Emergency Department Provider Note   ____________________________________________    I have reviewed the triage vital signs and the nursing notes.   HISTORY  Chief Complaint Abnormal Lab     HPI Cameron [name] is a 79 y.o. male with a history as noted below was sent in by his PCP for an abnormal lab value.  Apparently the patient had a potassium level of 6.3 and was called by his physician to come to the emergency department.  Patient reports he feels well and has no complaints.  No palpitations no chest pain, no weakness.  Past Medical History:  Diagnosis Date  . Appendicitis   . Atrial fibrillation ([place]) 12/12/2013  . BPH (benign prostatic hypertrophy)   . CAD (coronary artery disease) 12/2015   Cath by Dr [name] reveals distal and small vessel CAD.  Medical therapy advised.  . Chest pain 12/03/2015  . CHF (congestive heart failure) ([place])   . 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 Coronary artery disease   . Coronary artery disease involving native coronary artery of native heart with unstable angina pectoris ([place]) 08/11/2017  . History of blood transfusion 1968   "probably; related to getting wounded in [place]"  . History of kidney stones   . Hyperglycemia 11/05/2013  . Hyperlipidemia 11/05/2013  . Hypertension   . Hypothyroidism   . Hypothyroidism, unspecified 11/05/2013  . Inflammatory arthritis 11/05/2013  . Onychomycosis 12/20/2015  . OSA (obstructive sleep apnea) 10/26/2017    AHI of 8.1/h overall and 6.2/h during REM sleep.  AHI was 20/h while supine.  Oxygen saturations dropped to 87%.  Now on CPAP at 7cm H2O  . OSA on CPAP   . 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 Rheumatoid arthritis ([place])    "hands" (08/11/2017)  . Type II diabetes mellitus [facility])     Patient Active Problem List   Diagnosis Date Noted  . Benign prostatic hyperplasia with urinary frequency 01/20/2019  .  Phimosis 01/20/2019  . ILD (interstitial lung disease) ([place]) 01/17/2019  . Other secondary pulmonary hypertension ([place]) 01/17/2019  . OSA (obstructive sleep apnea) 10/26/2017  . Coronary artery disease involving native coronary artery of native heart with unstable angina pectoris ([place]) 08/11/2017  . Coronary artery disease   . 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 D-dimer, elevated 04/03/2017  . S/P TURP 01/05/2017  . Right lower quadrant abdominal pain   . Acute kidney injury ([place]) 09/09/2016  . Appendicitis   . Onychomycosis 12/20/2015  . Chest pain 12/03/2015  . Atrial fibrillation ([place]) 12/12/2013  . Arthritis 11/05/2013  . Hyperglycemia 11/05/2013  . Hyperglycemia, unspecified 11/05/2013  . Hyperlipidemia 11/05/2013  . Hyperlipidemia, unspecified 11/05/2013  . Hypothyroidism 11/05/2013  . Hypothyroidism, unspecified 11/05/2013  . Inflammatory arthritis 11/05/2013  . OA (osteoarthritis) 11/05/2013  . Osteoarthritis 11/05/2013  . Male erectile dysfunction 06/18/2012  . Urge incontinence 06/18/2012  . 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 Complete heart block ([place]) 12/09/2011  . Hypertension 12/09/2011  . Rheumatoid arthritis(714.0) 12/09/2011  . Rheumatoid arthritis ([place]) 12/09/2011    Past Surgical History:  Procedure Laterality Date  . BACK SURGERY    . CARDIAC CATHETERIZATION N/A 12/28/2015   Procedure: Left Heart Cath and Coronary Angiography;  Surgeon: Belva Crome, MD;  Location: [place] CV LAB;  Service: Cardiovascular;  Laterality: N/A;  . CATARACT EXTRACTION W/ INTRAOCULAR LENS  IMPLANT, BILATERAL Bilateral   . CORONARY ANGIOPLASTY WITH STENT PLACEMENT  08/11/2017   "2 stents"  . CORONARY STENT INTERVENTION N/A 08/11/2017   Procedure: CORONARY STENT INTERVENTION;  Surgeon: Troy Sine, MD;  Location: [place] CV LAB;  Service: Cardiovascular;  Laterality: N/A;  . CYSTOSCOPY W/ STONE MANIPULATION    . INGUINAL HERNIA REPAIR Left   . INSERT / REPLACE / REMOVE  PACEMAKER  12/09/2011   SJM Accent DR RF implanted by DR Allred for complete heart block and syncope  . JOINT REPLACEMENT    . LAPAROSCOPIC CHOLECYSTECTOMY    . LITHOTRIPSY    . LUMBAR DISC SURGERY     "removed arthritis and spurs"  . PERMANENT PACEMAKER INSERTION N/A 12/09/2011   Procedure: PERMANENT PACEMAKER INSERTION;  Surgeon: [name], MD;  Location: [facility] CATH LAB;  Service: Cardiovascular;  Laterality: N/A;  . REPLACEMENT TOTAL KNEE Right   . RIGHT/LEFT HEART CATH AND CORONARY ANGIOGRAPHY N/A 08/11/2017   Procedure: RIGHT/LEFT HEART CATH AND CORONARY ANGIOGRAPHY;  Surgeon: Larey Dresser, MD;  Location: [place] CV LAB;  Service: Cardiovascular;  Laterality: N/A;  . TRANSURETHRAL RESECTION OF PROSTATE  2017/2018    Prior to Admission medications   Medication Sig Start Date End Date Taking? Authorizing Provider  acetaminophen (TYLENOL) 500 MG tablet Take 500 mg by mouth every 6 (six) hours as needed.    [provider]  aspirin 81 MG chewable tablet Chew by mouth daily.    [provider]  BYSTOLIC 10 MG tablet Take 10 mg by mouth daily.  12/16/18   [provider]  clopidogrel (PLAVIX) 75 MG tablet TAKE ONE TABLET BY MOUTH EVERY MORNING WITH BREAKFAST 01/24/19   Lendon Colonel, NP  Ferrous Sulfate (IRON PO) Take 300 mg by mouth.    [provider]  furosemide (LASIX) 20 MG tablet Take 1 tablet (20 mg total) by mouth as needed (SWELLING IN LEG). 06/01/19 08/30/19  [name], PA  ketoconazole (NIZORAL) 2 % cream  08/26/18   [provider]  leflunomide (ARAVA) 10 MG tablet Take 1 tablet by mouth daily. 07/20/14   [provider]  lisinopril (ZESTRIL) 20 MG tablet Take 1 tablet (20 mg total) by mouth daily. *NEEDS OFFICE VISIT FOR FURTHER REFILLS* 07/05/19   Troy Sine, MD  magnesium oxide (MAG-OX) 400 MG tablet Take 1 tablet by mouth daily.    [provider]  metFORMIN (GLUCOPHAGE) 500 MG tablet Take 500 mg by mouth 2  (two) times daily. 01/06/19   [provider]  nitroGLYCERIN (NITROSTAT) 0.4 MG SL tablet DISSOLVE 1 TABLET UNDER THE TONGUE EVERY 5 MINUTES AS NEEDED FOR CHEST PAIN. 12/04/16   Allred, Jeneen Rinks, MD  Omega-3 Fatty Acids (OMEGA 3 PO) Take 1 tablet 2 (two) times daily by mouth.    [provider]  pravastatin (PRAVACHOL) 20 MG tablet Take 1 tablet (20 mg total) by mouth every evening. *NEEDS OFFICE VISIT FOR FURTHER REFILLS* 01/25/19   [name], PA  terbinafine (LAMISIL) 250 MG tablet Take by mouth.    [provider]  thyroid (ARMOUR) 90 MG tablet Take 90 mg by mouth every morning.     [provider]  trospium (SANCTURA) 20 MG tablet Take 20 mg by mouth 2 (two) times daily.    [provider]     Allergies Celecoxib  Family History  Problem Relation Age of Onset  . Pancreatic cancer Mother   . Bone cancer Father   . Alzheimer's disease Sister   . Heart attack Paternal Uncle   .  Arthritis Maternal Grandfather   . Alzheimer's disease Paternal Grandmother   . Lung cancer Paternal Grandfather     Social History Social History   Tobacco Use  . Smoking status: Former Smoker    Packs/day: 1.00    Years: 5.00    Pack years: 5.00    Types: Cigarettes    Quit date: 07/10/1972    Years since quitting: 47.0  . Smokeless tobacco: Former Systems developer    Types: Chew    Quit date: 1975  Substance Use Topics  . Alcohol use: Yes    Alcohol/week: 1.0 standard drinks    Types: 1 Glasses of wine per week  . Drug use: No    Review of Systems   Cardiovascular: As above Respiratory: Denies shortness of breath. Gastrointestinal: No abdominal pain.   Musculoskeletal: Negative for weakness  Neurological: Negative for headaches or weakness   ____________________________________________   PHYSICAL EXAM:  VITAL SIGNS: ED Triage Vitals  Enc Vitals Group     BP 08/02/19 1837 (!) 148/72     Pulse Rate 08/02/19 1837 93     Resp 08/02/19 1837 20     Temp  08/02/19 1837 98.7 F (37.1 C)     Temp Source 08/02/19 1837 Oral     SpO2 08/02/19 1837 97 %     Weight 08/02/19 1831 86.2 kg (190 lb)     Height 08/02/19 1831 1.702 m (_0 )     Head Circumference --      Peak Flow --      Pain Score 08/02/19 1831 0     Pain Loc --      Pain Edu? --      Excl. in [place]? --     Constitutional: Alert and oriented.   Nose: No congestion/rhinnorhea. Mouth/Throat: Mucous membranes are moist.   Neck:  Painless ROM Cardiovascular: Normal rate, regular rhythm.  Good peripheral circulation. Respiratory: Normal respiratory effort.  No retractions.   Musculoskeletal: No lower extremity tenderness nor edema.  Warm and well perfused Neurologic:  Normal speech and language. No gross focal neurologic deficits are appreciated.  Skin:  Skin is warm, dry and intact. No rash noted. Psychiatric: Mood and affect are normal. Speech and behavior are normal.  ____________________________________________   LABS (all labs ordered are listed, but only abnormal results are displayed)  Labs Reviewed  BASIC METABOLIC PANEL - Abnormal; Notable for the following components:      Result Value   Potassium 5.2 (*)    BUN 28 (*)    Creatinine, Ser 1.26 (*)    GFR calc non Af Amer 54 (*)    All other components within normal limits  CBC  TROPONIN I (HIGH SENSITIVITY)   ____________________________________________  EKG  ED ECG REPORT I, Lavonia Drafts, the attending physician, personally viewed and interpreted this ECG.  Date: 08/02/2019  Rhythm: AV dual paced QRS Axis: normal Intervals: Abnormal ST/T Wave abnormalities: Abnormal Narrative Interpretation: no evidence of acute ischemia  ____________________________________________  RADIOLOGY  None____________________________________________   PROCEDURES  Procedure(s) performed: No  Procedures   Critical Care performed: No ____________________________________________   INITIAL IMPRESSION /  ASSESSMENT AND PLAN / ED COURSE  Pertinent labs & imaging results that were available during my care of the patient were reviewed by me and considered in my medical decision making (see chart for details).  Patient well-appearing and asymptomatic.  Rechecked BMP here, potassium is 5.2.  Reviewing the patient's record, his potassiums have typically ranged from 5-5.5.  Appropriate  for discharge at this time, recommend p.o. hydration.  Outpatient follow-up with PCP for recheck as needed.    ____________________________________________   FINAL CLINICAL IMPRESSION(S) / ED DIAGNOSES  Final diagnoses:  Hyperkalemia        Note:  This document was prepared using Dragon voice recognition software and may include unintentional dictation errors.   Lavonia Drafts, MD 08/02/19 [phone]

## 2019-08-02 NOTE — ED Triage Notes (Signed)
Pt in via POV, sent from cardiologist due to abnormal lab work, K+ 6.3 per pt report; results placed with chart.  Ambulatory to triage, denies any complaints.  NAD noted at this time.

## 2019-08-02 NOTE — ED Notes (Signed)
ED Provider at bedside. 

## 2019-08-02 NOTE — Telephone Encounter (Signed)
Returned the call to the patient's wife. She stated that the patient had labs drawn two weeks ago which showed a potassium level of 5.5. He was advised to decrease the lisinopril to 10 mg once daily and avoid potassium rich foods.  He had his potassium rechecked yesterday which showed a level of 6.3. The patient's wife has been made aware he should go to the Emergency room for a repeat lab. The wife will take him to [facility] within the hour.

## 2019-08-02 NOTE — Discharge Instructions (Addendum)
Your potassium was rechecked here in the emergency department and was 5.2 which is in line with your prior results

## 2019-08-04 ENCOUNTER — Encounter: Payer: Self-pay | Admitting: Physician Assistant

## 2019-08-04 ENCOUNTER — Other Ambulatory Visit: Payer: Self-pay

## 2019-08-04 ENCOUNTER — Ambulatory Visit (INDEPENDENT_AMBULATORY_CARE_PROVIDER_SITE_OTHER): Payer: Medicare Other | Admitting: Physician Assistant

## 2019-08-04 VITALS — BP 163/74 | HR 86 | Ht 67.0 in | Wt 192.8 lb

## 2019-08-04 DIAGNOSIS — L258 Unspecified contact dermatitis due to other agents: Secondary | ICD-10-CM | POA: Diagnosis not present

## 2019-08-04 DIAGNOSIS — N471 Phimosis: Secondary | ICD-10-CM | POA: Diagnosis not present

## 2019-08-04 DIAGNOSIS — N401 Enlarged prostate with lower urinary tract symptoms: Secondary | ICD-10-CM | POA: Diagnosis not present

## 2019-08-04 DIAGNOSIS — R32 Unspecified urinary incontinence: Secondary | ICD-10-CM | POA: Diagnosis not present

## 2019-08-04 DIAGNOSIS — R350 Frequency of micturition: Secondary | ICD-10-CM

## 2019-08-04 MED ORDER — TAMSULOSIN HCL 0.4 MG PO CAPS: 0.4000 mg | ORAL_CAPSULE | Freq: Every day | ORAL | 2 refills | Status: DC

## 2019-08-04 NOTE — Telephone Encounter (Signed)
Agree; thx

## 2019-08-04 NOTE — Progress Notes (Signed)
08/04/2019 3:59 PM   Cameron [name] 06-13-1940 [id number]  CC: Urinary incontinence, red foreskin  HPI: Cameron [name] is a 79 y.o. comorbid male with urologic history significant for BPH with detrusor overactivity previously on Flomax and trospium and phimosis who presents today for evaluation of urinary incontinence and reddened foreskin. He is an established [facility] patient who last saw Dr. Bernardo Heater on 01/20/2019 for routine follow-up of the above.  He was recommended to undergo circumcision versus dorsal slit at that time, however he elected to defer this pending worsened symptoms.  Today, he reports an approximate 2 to 39-monthhistory of presumed infection of the foreskin and penis.  He states that he has noticed that the tip of his penis is red and irritated when he performs foreskin hygiene.  He has been using Polysporin ointment at home for management of this with no improvement noted.  He characterizes the "infection" is a red rash that is associated with slight burning without penile discharge.  He believes his foreskin is swollen.  He continues to take trospium 20 mg nightly and was previously on Flomax as well but believes he has stopped this medication for unknown reasons.  He would like to restart this at this time.  PMH: Past Medical History:  Diagnosis Date  . Appendicitis   . Atrial fibrillation ([place] 12/12/2013  . BPH (benign prostatic hypertrophy)   . CAD (coronary artery disease) 12/2015   Cath by Dr STamala Julianreveals distal and small vessel CAD.  Medical therapy advised.  . Chest pain 12/03/2015  . CHF (congestive heart failure) ([place]   . 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 Coronary artery disease   . Coronary artery disease involving native coronary artery of native heart with unstable angina pectoris ([place] 08/11/2017  . History of blood transfusion 1968   "probably; [place]  . History of kidney stones   . Hyperglycemia 11/05/2013  .  Hyperlipidemia 11/05/2013  . Hypertension   . Hypothyroidism   . Hypothyroidism, unspecified 11/05/2013  . Inflammatory arthritis 11/05/2013  . Onychomycosis 12/20/2015  . OSA (obstructive sleep apnea) 10/26/2017    AHI of 8.1/h overall and 6.2/h during REM sleep.  AHI was 20/h while supine.  Oxygen saturations dropped to 87%.  Now on CPAP at 7cm H2O  . OSA on CPAP   . 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 Rheumatoid arthritis ([place]    "hands" (08/11/2017)  . Type II diabetes mellitus ([place]     Surgical History: Past Surgical History:  Procedure Laterality Date  . BACK SURGERY    . CARDIAC CATHETERIZATION N/A 12/28/2015   Procedure: Left Heart Cath and Coronary Angiography;  Surgeon: HBelva Crome MD;  [facility];  Service: Cardiovascular;  Laterality: N/A;  . CATARACT EXTRACTION W/ INTRAOCULAR LENS  IMPLANT, BILATERAL Bilateral   . CORONARY ANGIOPLASTY WITH STENT PLACEMENT  08/11/2017   "2 stents"  . CORONARY STENT INTERVENTION N/A 08/11/2017   Procedure: CORONARY STENT INTERVENTION;  Surgeon: KTroy Sine MD;  [facility];  Service: Cardiovascular;  Laterality: N/A;  . CYSTOSCOPY W/ STONE MANIPULATION    . INGUINAL HERNIA REPAIR Left   . INSERT / REPLACE / REMOVE PACEMAKER  12/09/2011   SJM Accent DR RF implanted by DR Allred for complete heart block and syncope  . JOINT REPLACEMENT    . LAPAROSCOPIC CHOLECYSTECTOMY    .  LITHOTRIPSY    . LUMBAR DISC SURGERY     "removed arthritis and spurs"  . PERMANENT PACEMAKER INSERTION N/A 12/09/2011   Procedure: PERMANENT PACEMAKER INSERTION;  Surgeon: [name], MD;  Location: [facility] CATH LAB;  Service: Cardiovascular;  Laterality: N/A;  . REPLACEMENT TOTAL KNEE Right   . RIGHT/LEFT HEART CATH AND CORONARY ANGIOGRAPHY N/A 08/11/2017   Procedure: RIGHT/LEFT HEART CATH AND CORONARY ANGIOGRAPHY;  Surgeon: Larey Dresser, MD;  Location: [place] CV LAB;   Service: Cardiovascular;  Laterality: N/A;  . TRANSURETHRAL RESECTION OF PROSTATE  2017/2018    Home Medications:  Allergies as of 08/04/2019      Reactions   Celecoxib Rash   Skin rash      Medication List       Accurate as of August 04, 2019  3:59 PM. If you have any questions, ask your nurse or doctor.        STOP taking these medications   IRON PO Stopped by: Debroah Loop, PA-C   ketoconazole 2 % cream Commonly known as: NIZORAL Stopped by: Debroah Loop, PA-C   magnesium oxide 400 MG tablet Commonly known as: MAG-OX Stopped by: Debroah Loop, PA-C   OMEGA 3 PO Stopped by: Debroah Loop, PA-C   terbinafine 250 MG tablet Commonly known as: LAMISIL Stopped by: Debroah Loop, PA-C     TAKE these medications   acetaminophen 500 MG tablet Commonly known as: TYLENOL Take 500 mg by mouth as needed.   aspirin 81 MG chewable tablet Chew by mouth daily.   Bystolic 10 MG tablet Generic drug: nebivolol Take 10 mg by mouth daily.   clopidogrel 75 MG tablet Commonly known as: PLAVIX TAKE ONE TABLET BY MOUTH EVERY MORNING WITH BREAKFAST   furosemide 20 MG tablet Commonly known as: LASIX Take 1 tablet (20 mg total) by mouth as needed (SWELLING IN LEG).   gabapentin 300 MG capsule Commonly known as: NEURONTIN Take 300 mg by mouth at bedtime.   glimepiride 2 MG tablet Commonly known as: AMARYL Take 2 mg by mouth every morning.   leflunomide 10 MG tablet Commonly known as: ARAVA Take 1 tablet by mouth daily.   lisinopril 20 MG tablet Commonly known as: ZESTRIL Take 1 tablet (20 mg total) by mouth daily. *NEEDS OFFICE VISIT FOR FURTHER REFILLS*   metFORMIN 500 MG tablet Commonly known as: GLUCOPHAGE Take 500 mg by mouth 2 (two) times daily.   nitroGLYCERIN 0.4 MG SL tablet Commonly known as: NITROSTAT DISSOLVE 1 TABLET UNDER THE TONGUE EVERY 5 MINUTES AS NEEDED FOR CHEST PAIN.   pantoprazole 40 MG  tablet Commonly known as: PROTONIX Take 40 mg by mouth 2 (two) times daily.   pravastatin 20 MG tablet Commonly known as: PRAVACHOL Take 1 tablet (20 mg total) by mouth every evening. *NEEDS OFFICE VISIT FOR FURTHER REFILLS*   tamsulosin 0.4 MG Caps capsule Commonly known as: FLOMAX Take 1 capsule (0.4 mg total) by mouth daily. Started by: Debroah Loop, PA-C   thyroid 90 MG tablet Commonly known as: ARMOUR Take 90 mg by mouth every morning.   trospium 20 MG tablet Commonly known as: SANCTURA Take 20 mg by mouth 2 (two) times daily.       Allergies:  Allergies  Allergen Reactions  . Celecoxib Rash    Skin rash     Family History: Family History  Problem Relation Age of Onset  . Pancreatic cancer Mother   . Bone cancer Father   . Alzheimer's disease Sister   .  Heart attack Paternal Uncle   . Arthritis Maternal Grandfather   . Alzheimer's disease Paternal Grandmother   . Lung cancer Paternal Grandfather     Social History:   reports that he quit smoking about 47 years ago. His smoking use included cigarettes. He has a 5.00 pack-year smoking history. He quit smokeless tobacco use about 46 years ago.  His smokeless tobacco use included chew. He reports current alcohol use of about 1.0 standard drinks of alcohol per week. He reports that he does not use drugs.  Physical Exam: BP (!) 163/74 (BP Location: Left Arm, Patient Position: Sitting, Cuff Size: Normal)   Pulse 86   Ht 5' 7" (1.702 m)   Wt 192 lb 12.8 oz (87.5 kg)   BMI 30.20 kg/m   Constitutional:  Alert and oriented, no acute distress, nontoxic appearing HEENT: [place], AT Cardiovascular: No clubbing, cyanosis, or edema Respiratory: Normal respiratory effort, no increased work of breathing GU: Uncircumcised penis.  Phimosis notable for a hypopigmented band of thickened, scarlike tissue.  Glans penis diffusely erythematous without visible smegma or discharge; noted to be moist upon retraction of the  foreskin.  No notable edema or satellite lesions of the penis or foreskin. Skin: No rashes, bruises or suspicious lesions Neurologic: Grossly intact, no focal deficits, moving all 4 extremities Psychiatric: Normal mood and affect  Assessment & Plan:   1. Phimosis Noted on physical exam today, consistent with prior diagnoses.  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  Patient is able to reduce the foreskin after it is retracted.  2. 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  Recommend switching from Polysporin to a moisture barrier ointment and proceeding with dorsal slit versus circumcision at this time.  I had a lengthy conversation with the patient today regarding circumcision versus dorsal slit today.  I explained that dorsal slit is associated with a shorter recovery period, however can result in a cosmetically displeasing outcome.  I explained that circumcision can cause glans penis sensitivity that will improve with time.  I explained that both of these procedures would provide relief of urinary entrapment behind the foreskin and is believed to improve his moisture dermatitis.  Patient prefers to proceed with dorsal slit at this time.  Based on his medical comorbidities and upon consultation with Dr. Bernardo Heater, we will plan to proceed with this procedure in the office under local anesthesia.  Patient expressed understanding and is in agreement with this plan.  3. Benign prostatic hyperplasia with urinary frequency Previously well managed on Flomax, however he does not believe he is currently taking Flomax and is unsure why.  Will represcribe at this time.  Counseled patient to continue trospium 20 mg nightly as this is well-tolerated. - tamsulosin (FLOMAX) 0.4 MG CAPS capsule; Take 1 capsule (0.4 mg total) by mouth daily.  Dispense: 30 capsule; Refill: 2   Return for  Dorsal Slit (In office, ok per [place]) .  Debroah Loop, PA-C  [facility] [address] [place], [place] [address] [phone]

## 2019-08-04 NOTE — Patient Instructions (Signed)
1. Continue with your genital hygiene regimen.  I recommend that you switch from Polysporin ointment to a moisture barrier cream under your foreskin.  You can find these over-the-counter at your local pharmacy.  Common brands include A&D ointment, Aquaphor, or Boudreaux's Butt Paste. 2. We will plan to proceed with a dorsal slit procedure in the office with Dr. Bernardo Heater. You will receive local anesthesia for the procedure to relieve discomfort.

## 2019-08-08 ENCOUNTER — Telehealth: Payer: Self-pay

## 2019-08-08 NOTE — Telephone Encounter (Signed)
Patient notified

## 2019-08-08 NOTE — Telephone Encounter (Signed)
Yes, that is fine.

## 2019-08-08 NOTE — Telephone Encounter (Signed)
Patient called stating that he has started taking the Flomax and is having increased urgency and frequency along with urge incontinence. Can he stop the Flomax?

## 2019-08-10 ENCOUNTER — Ambulatory Visit (INDEPENDENT_AMBULATORY_CARE_PROVIDER_SITE_OTHER): Payer: Medicare Other | Admitting: *Deleted

## 2019-08-10 ENCOUNTER — Telehealth: Payer: Self-pay | Admitting: Internal Medicine

## 2019-08-10 DIAGNOSIS — I442 Atrioventricular block, complete: Secondary | ICD-10-CM

## 2019-08-10 LAB — CUP PACEART REMOTE DEVICE CHECK
Battery Remaining Longevity: 17 mo
Battery Remaining Percentage: 15 %
Battery Voltage: 2.74 V
Brady Statistic AP VP Percent: 97 %
Brady Statistic AP VS Percent: 1 %
Brady Statistic AS VP Percent: 2.6 %
Brady Statistic AS VS Percent: 1 %
Brady Statistic RA Percent Paced: 97 %
Brady Statistic RV Percent Paced: 99 %
Date Time Interrogation Session: 20210317033705
Implantable Lead Implant Date: 20130716
Implantable Lead Implant Date: 20130716
Implantable Lead Location: 753859
Implantable Lead Location: 753860
Implantable Lead Model: 1948
Implantable Pulse Generator Implant Date: 20130716
Lead Channel Impedance Value: 340 Ohm
Lead Channel Impedance Value: 600 Ohm
Lead Channel Pacing Threshold Amplitude: 0.5 V
Lead Channel Pacing Threshold Amplitude: 0.625 V
Lead Channel Pacing Threshold Pulse Width: 0.5 ms
Lead Channel Pacing Threshold Pulse Width: 0.5 ms
Lead Channel Sensing Intrinsic Amplitude: 12 mV
Lead Channel Sensing Intrinsic Amplitude: 2 mV
Lead Channel Setting Pacing Amplitude: 0.75 V
Lead Channel Setting Pacing Amplitude: 1.625
Lead Channel Setting Pacing Pulse Width: 0.5 ms
Lead Channel Setting Sensing Sensitivity: 4 mV
Pulse Gen Model: 2210
Pulse Gen Serial Number: 7356500

## 2019-08-10 NOTE — Progress Notes (Signed)
PPM Remote  

## 2019-08-12 NOTE — Telephone Encounter (Signed)
she scheduled for research visit on 04/06/202

## 2019-08-17 ENCOUNTER — Telehealth: Payer: Self-pay | Admitting: *Deleted

## 2019-08-17 NOTE — Telephone Encounter (Signed)
Cameron [name] from [place] states she is returning a call from our office requesting what type of surgery is being performed and what type of anesthesia is being used. She states the type of surgery is a Dorsal slit at the foreskin, and there will be no anesthesia used.

## 2019-08-17 NOTE — Telephone Encounter (Signed)
   [facility] Pre-operative Risk Assessment    Request for surgical clearance:  1. What type of surgery is being performed?   DORSAL SLIT  2. When is this surgery scheduled?  TBD BUT WANTS TO DO ASAP   3. What type of clearance is required (medical clearance vs. Pharmacy clearance to hold med vs. Both)?  BOTH  4. Are there any medications that need to be held prior to surgery and how long?  ASPIRIN & PLAVIX X'S 5 DAYS  5. Practice name and name of physician performing surgery?  [facility]   6. What is your office phone number [phone]    7.   What is your office fax number [phone]  8.   Anesthesia type (None, local, MAC, general) ?  ?   Jeanann Lewandowsky 08/17/2019, 9:38 AM  _________________________________________________________________   (provider comments below)

## 2019-08-17 NOTE — Telephone Encounter (Signed)
Dr. Claiborne Billings, Can aspirin and plavix be held for 5 days for urologic procedure- dorsal slit?  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 His procedurewas difficult but successful with an excellent result in a 2.5 x 15 mm Resolute stent was inserted into the OM 1 vessel at the ostium and a 3.0 x 18 mm Resolute DES stent was inserted from the proximal circumflex into the mid AV groove circumflex beyond the OM1 vessel. 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  Please route response back to P CV DIV PREOP  Thanks, Gae Bon

## 2019-08-18 ENCOUNTER — Other Ambulatory Visit: Payer: Medicare Other | Admitting: Urology

## 2019-08-18 NOTE — Telephone Encounter (Signed)
  Surgery has been changed from TBD to 08/29/19. Office needs clearance. Please see previous attached clearance note.

## 2019-08-18 NOTE — Telephone Encounter (Signed)
Dr. Claiborne Billings, Can aspirin and plavix be held for 5 days for urologic procedure- dorsal slit?  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 His procedurewas difficult but successful with an excellent result in a 2.5 x 15 mm Resolute stent was inserted into the OM 1 vessel at the ostium and a 3.0 x 18 mm Resolute DES stent was inserted from the proximal circumflex into the mid AV groove circumflex beyond the OM1 vessel. 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  Please route response back to P CV DIV PREOP  Thanks, Gae Bon

## 2019-08-19 ENCOUNTER — Telehealth: Payer: Self-pay | Admitting: *Deleted

## 2019-08-19 NOTE — Telephone Encounter (Signed)
Advised patient to stop his aspirin and Plavix 5 days prior to procedure on 08/29/2019. Scanned in chart.

## 2019-08-19 NOTE — Telephone Encounter (Signed)
Okay to hold for 5 days prior to procedure

## 2019-08-19 NOTE — Telephone Encounter (Signed)
   Primary Cardiologist: [name], MD  Chart reviewed as part of pre-operative protocol coverage. Patient was contacted 08/19/2019 in reference to pre-operative risk assessment for pending surgery as outlined below.  Cameron [name] was last seen on 07/05/19 by Dr. Claiborne Billings.  Since that day, Cameron [name] has done well. He walks at least 1 mile 3 days per week and exercises without significant exertional symptoms. He can easily achieve greater than 4 METS of activity.   Therefore, based on ACC/AHA guidelines, the patient would be at acceptable risk for the planned procedure without further cardiovascular testing.   Aspirin and Plavix can be held for 5 days prior to procedure. Resume as soon as OK with urologist.  I will route this recommendation to the requesting party via [facility] fax function and remove from pre-op pool.  Please call with questions.  Daune Perch, NP 08/19/2019, 11:39 AM

## 2019-08-29 ENCOUNTER — Encounter: Payer: Self-pay | Admitting: Urology

## 2019-08-29 ENCOUNTER — Other Ambulatory Visit: Payer: Self-pay | Admitting: Physician Assistant

## 2019-08-29 ENCOUNTER — Ambulatory Visit (INDEPENDENT_AMBULATORY_CARE_PROVIDER_SITE_OTHER): Payer: Medicare Other | Admitting: Urology

## 2019-08-29 ENCOUNTER — Other Ambulatory Visit: Payer: Self-pay

## 2019-08-29 ENCOUNTER — Ambulatory Visit (INDEPENDENT_AMBULATORY_CARE_PROVIDER_SITE_OTHER): Payer: Medicare Other | Admitting: Dermatology

## 2019-08-29 VITALS — BP 161/85 | HR 97 | Ht 67.0 in | Wt 185.0 lb

## 2019-08-29 DIAGNOSIS — L570 Actinic keratosis: Secondary | ICD-10-CM

## 2019-08-29 DIAGNOSIS — L409 Psoriasis, unspecified: Secondary | ICD-10-CM

## 2019-08-29 DIAGNOSIS — L304 Erythema intertrigo: Secondary | ICD-10-CM

## 2019-08-29 DIAGNOSIS — N471 Phimosis: Secondary | ICD-10-CM

## 2019-08-29 DIAGNOSIS — N3941 Urge incontinence: Secondary | ICD-10-CM

## 2019-08-29 DIAGNOSIS — L820 Inflamed seborrheic keratosis: Secondary | ICD-10-CM

## 2019-08-29 DIAGNOSIS — L219 Seborrheic dermatitis, unspecified: Secondary | ICD-10-CM

## 2019-08-29 DIAGNOSIS — D229 Melanocytic nevi, unspecified: Secondary | ICD-10-CM

## 2019-08-29 DIAGNOSIS — L853 Xerosis cutis: Secondary | ICD-10-CM

## 2019-08-29 DIAGNOSIS — N401 Enlarged prostate with lower urinary tract symptoms: Secondary | ICD-10-CM

## 2019-08-29 DIAGNOSIS — D1800 Hemangioma unspecified site: Secondary | ICD-10-CM

## 2019-08-29 LAB — URINALYSIS, COMPLETE
Bilirubin, UA: NEGATIVE
Glucose, UA: NEGATIVE
Ketones, UA: NEGATIVE
Leukocytes,UA: NEGATIVE
Nitrite, UA: NEGATIVE
Protein,UA: NEGATIVE
Specific Gravity, UA: 1.025 (ref 1.005–1.030)
Urobilinogen, Ur: 0.2 mg/dL (ref 0.2–1.0)
pH, UA: 5.5 (ref 5.0–7.5)

## 2019-08-29 LAB — MICROSCOPIC EXAMINATION: Bacteria, UA: NONE SEEN

## 2019-08-29 MED ORDER — HYDROCODONE-ACETAMINOPHEN 5-325 MG PO TABS: 1.0000 | ORAL_TABLET | Freq: Four times a day (QID) | ORAL | 0 refills | Status: DC | PRN

## 2019-08-29 MED ORDER — BETAMETHASONE DIPROPIONATE 0.05 % EX OINT: TOPICAL_OINTMENT | Freq: Two times a day (BID) | CUTANEOUS | 2 refills | Status: DC | PRN

## 2019-08-29 MED ORDER — KETOCONAZOLE 2 % EX CREA: 1.0000 "application " | TOPICAL_CREAM | Freq: Two times a day (BID) | CUTANEOUS | 11 refills | Status: DC

## 2019-08-29 MED ORDER — KETOCONAZOLE 2 % EX CREA: 1.0000 "application " | TOPICAL_CREAM | Freq: Two times a day (BID) | CUTANEOUS | 1 refills | Status: DC | PRN

## 2019-08-29 MED ORDER — HYDROCORTISONE 2.5 % EX CREA: TOPICAL_CREAM | Freq: Two times a day (BID) | CUTANEOUS | 2 refills | Status: DC | PRN

## 2019-08-29 MED ORDER — SULFAMETHOXAZOLE-TRIMETHOPRIM 800-160 MG PO TABS: 1.0000 | ORAL_TABLET | Freq: Two times a day (BID) | ORAL | 0 refills | Status: AC

## 2019-08-29 MED ORDER — CALCIPOTRIENE 0.005 % EX CREA: TOPICAL_CREAM | Freq: Two times a day (BID) | CUTANEOUS | 11 refills | Status: DC

## 2019-08-29 NOTE — Progress Notes (Signed)
Follow-Up Visit   Subjective  Cameron [name] is a 79 y.o. male who presents for the following: Rash (R elbow for past 5 years, L inner thigh,left buttock,L hip for past month 1 month).  Patient is being followed by  Dr. Jefm Bryant in Rheumatology  The following portions of the chart were reviewed this encounter and updated as appropriate: Tobacco  Allergies  Meds  Problems  Med Hx  Surg Hx  Fam Hx      Review of Systems: No other skin or systemic complaints.  Objective  Well appearing patient in no apparent distress; mood and affect are within normal limits.  A full examination was performed including scalp, head, eyes, ears, nose, lips, neck, chest, axillae, abdomen, back, buttocks, bilateral upper extremities, bilateral lower extremities, hands, feet, fingers, toes, fingernails, and toenails. All findings within normal limits unless otherwise noted below.  Objective  Right Malar Cheek: Erythematous thin papules/macules with gritty scale.   Objective  Left Medial Thigh: Scaly erythematous papules coalescing to plaque at inner thigh  Objective  Head - Anterior (Face): Erythematous keratotic or waxy stuck-on papule or plaque.   Objective  Right Elbow - Posterior: Well-demarcated erythematous papules/plaques with silvery scale.   Objective  Left Ear: Pink patches with greasy scale.   Assessment & Plan  AK (actinic keratosis) Right Malar Cheek  Destruction of lesion - Right Malar Cheek  Destruction method: cryotherapy   Informed consent: discussed and consent obtained   Lesion destroyed using liquid nitrogen: Yes   Outcome: patient tolerated procedure well with no complications   Post-procedure details: wound care instructions given    Erythema intertrigo Left Medial Thigh  Will start ketoconazole cream BID to affected areas at inner thigh. 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 Follows with Dr. Jefm Bryant with rheumatology Reviewed risk of heart disease   Psoriasis Medications  For any areas on the buttocks crease, inner thighs, face, and axillae  1. Apply calcipotriene twice a day 2. Apply hydrocortisone twice a day for up to 3 weeks, then on weekends only  For other areas on the body  1. Apply calcipotriene twice a day 2. Apply augmented betamethasone ointment twice a day for up to 3 weeks, then on weekends only  calcipotriene (DOVONOX) 0.005 % cream - Right Elbow - Posterior  hydrocortisone 2.5 % cream - Right Elbow - Posterior  POCT Skin KOH - Right Elbow - Posterior  ketoconazole (NIZORAL) 2 % cream - Right Elbow - Posterior  Seborrheic dermatitis Left Ear  Can use the ketoconazole cream BID  Melanocytic Nevi - Tan-brown and/or pink-flesh-colored symmetric macules and papules - Benign appearing on exam today - Observation - Call clinic for new or changing moles - Recommend daily use of broad spectrum spf 30+ sunscreen to sun-exposed areas.   Hemangiomas - Red papules - Discussed benign nature - Observe - Call for any changes  Xerosis  Return in about 1 month (around 09/28/2019).   IDonzetta Kohut, CMA, am acting as scribe for Forest Gleason, MD .  Documentation: I have  reviewed the above documentation for accuracy and completeness, and I agree with the above.  Forest Gleason, MD

## 2019-08-29 NOTE — Progress Notes (Signed)
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  After discussing management options he has elected to proceed with dorsal slit  Description: He was placed on the procedure table in the supine position and his external genitalia were prepped and draped in the usual fashion.  A dorsal penile block was performed with 12 mL of 1% plain Xylocaine.  Once adequate anesthesia was obtained the dorsal prepuce was clamped with a straight hemostat and incised with scissors just beyond the corona radiata.  Hemostasis was obtained with cautery.  The inner/outer preputial edges were then sutured on each side with running 3-0 chromic suture.  A dressing of Xeroform gauze and Kling was applied.  Instructions: No shower x48 hours and no bath, pool or hot tub x7 days.  Remove dressing in a.m. and he was instructed to remove or earlier if it becomes wet with urine.  Instructed to call for fever greater than 101 degrees, increased redness or drainage.  No intercourse x4 weeksRx hydrocodone/APAP and Septra DS sent to pharmacy. Follow-up appointment 1 month.   John Giovanni, MD  I, Lucas Mallow, am acting as a scribe for Dr. Nicki Reaper C. Stoioff,  I have reviewed the above documentation for accuracy and completeness, and I agree with the above.    Abbie Sons, MD

## 2019-08-29 NOTE — Patient Instructions (Addendum)
Psoriasis Medications  For any areas on the buttocks crease, inner thighs, face, and axillae  1. Apply calcipotriene twice a day 2. Apply hydrocortisone twice a day for up to 3 weeks, then on weekends only  For other areas on the body  1. Apply calcipotriene twice a day 2. Apply augmented betamethasone ointment twice a day for up to 3 weeks, then on weekends only   Topical steroids (such as triamcinolone, fluocinolone, fluocinonide, mometasone, clobetasol, halobetasol, betamethasone, hydrocortisone) can cause thinning and lightening of the skin if they are used for too long in the same area. Your physician has selected the right strength medicine for your problem and area affected on the body. Please use your medication only as directed by your physician to prevent side effects.    Intertrigo Medicine  Apply ketoconazole cream twice a day to the inner thighs and buttocks fold until rash is clear    Gentle Skin Care Guide  1. Bathe no more than once a day.  2. Avoid bathing in hot water  3. Use a mild soap like Dove, Vanicream, Cetaphil, CeraVe. Can use Lever 2000 or       Cetaphil antibacterial soap  4. Use soap only where you need it. On most days, use it under your arms, between       your legs, and on your fee. Let the water rinse other areas unless visibly dirty.  5. When you get out of the bath/shower, use a towel to gently blot your skin dry, don't           rub it.  6. While your skin is still a little damp, apply a moisturizing cream such as Vanicream,       CeraVe, Cetaphil, Eucerin, Sarna lotion or plain Vaseline Jelly. For hands apply        Neutrogena [place] Hand Cream or Excipial Hand Cream.  7. Reapply moisturizer any time you start to itch or feel dry.  8. Sometimes using free and clear laundry detergents can be helpful. Fabric softener       sheets should be avoided. Downy Free & Gentle liquid, or any liquid fabric softener       that is free of  dyes and perfumes, it acceptable to use  9. If your doctor has given you prescription creams you may apply moisturizers over them    Recommend Gold Bond Rapid relief cream three times daily   Recommend daily broad spectrum sunscreen SPF 30+ to sun-exposed areas, reapply every 2 hours as needed. Call for new or changing lesions.   Psoriasis  What causes psoriasis? A patient's immune system plays a role in the development of psoriasis through the over-activity of a type of white blood cell called a T cell.  Once these cells are activated, a reaction is triggered that causes the skin to grow faster than normal.  New skin cells form in days instead of weeks, and these cells do not shed.  These cells pile up on the skin, and this results in what you see as psoriasis.  It is not contagious.  There is a genetic component to psoriasis, although not everyone inherits the gene.  What triggers psoriasis? Common triggers are stress, strep throat infection (more commonly in kids), and cold weather.  During stressful events, psoriasis tends to flare.  Certain medications such as lithium, some blood pressure medications, and some drugs used to treat malaria can trigger psoriasis.  A skin injury can also trigger psoriasis to develop  at the site of injury.  Types of Psoriasis 1. Plaque psoriasis:  80% of patients with psoriasis have plaque psoriasis.  Plaques usually form on elbows, knees, and lower back and appear raised and reddish with a silvery white scale. 2. Nail psoriasis:  Fingernails and toenails may be affected.  Initially small pits may form, then with time, the nails thicken, lift up, and crumble.   3. Scalp psoriasis:  Similar in appearance to plaque psoriasis on the body.  Tends to be itchy.  Clinically can resemble dandruff because of the scales that can fall on a patient's shirt.  This may be difficult to control. 4. Pustular psoriasis:  Usually involves the palms and soles appearing as white,  pus-filled bumps.  Rarely, it may develop all over the body, which can make patients seriously ill. 5. Guttate psoriasis:  Usually occurs in children and young adults.  The lesions are smaller than plaque psoriasis.  May be triggered by a strep throat infection.  Many times it clears on its own, and the patient may or may not develop psoriasis again.   6. Inverse psoriasis:  Involves the folds of the skin and may be painful.  This appears as red, shiny patches.  It may involve the armpits, under the breasts, the genital area, or the crease of the buttock.  7. Psoriatic arthritis:  Up to one third of patients with psoriasis will develop arthritis.  It commonly affects the hands, feet, and spine, and may begin as stiffness.  If untreated it may lead to permanent joint damage.  Treatment There is no cure for psoriasis, but it can be controlled.  Some patients undergo more than one type of treatment.  1. Topical medications (applied externally to the skin) a. Corticosteroids:  typically first-line treatment for psoriasis.  They control the inflammation of psoriasis.  They are available as creams, ointments, sprays, foams, or lotions.  It is important to follow your dermatologist's instructions as to how to apply the medicine.  For example, excessive use of strong steroid creams (especially to body creases and the face) can cause thinning and lightening of the skin.  This can lead to the formation of stretch marks in the folds.   b. Calcipotriene and calcipotriol (Vitamin D derivatives):  These are usually used in conjunction with a steroid cream.  Sometimes they may be used as maintenance once psoriasis is under control. c. Retinoids:  sometimes used in conjunction with a topical steroid cream.  Women should not use retinoids if they are pregnant. d. Coal tar:  an older treatment that is still effective, especially in combination with steroids.  It can be messy and may have an odor in some  preparations. 2. Light treatments:  may provide a safe and effective treatment for psoriasis.  The main risks of light therapy are sunburn and possible increase in skin cancers.   a. Laser therapy:  can treat a certain stubborn area of psoriasis such as scalp, feet, and hands.  It is not used for large areas. b. Narrowband UVB:  A patient stands in front of panels of lights for a set amount of time.  A typical course might be 24 treatments over 2 months.  c. PUVA:  This treatment combines exposure to UVA light with light-sensitizing medication called psoralen.  It may come as a pill or as a lotion.  The main side effects are nausea (from the psoralen pill) and significantly increased risk of skin cancer. 3. Oral medications:  used  for moderate to severe psoriasis.  These medications are very effective, but they have a number of potentially serious side effects. a. Methotrexate b. Soriatane (acitretin) c. Cyclosporine 4. Biologic medications:  used for moderate to severe psoriasis.  These are newer medications that suppress the immune cells responsible for psoriasis.  They generally produce good results, but they all increase the risk of infection.  These are given as injections or IV infusions. a. Enbrel (etanercept) b. Humira (adalimumab) c. Remicade (infliximab) d. Stelara (ustekinumab)  Recent studies have found that patients with psoriasis are more prone to developing metabolic syndrome:  high blood pressure, coronary artery disease, high cholesterol, and diabetes.  It is very important for patients with psoriasis to live healthy lifestyles.  Diet and exercise are important.  Support groups:   www.psoriasis.org , http://www.skincare[facility].com/psoriasisnet/index.html

## 2019-08-30 ENCOUNTER — Encounter: Payer: Self-pay | Admitting: Dermatology

## 2019-08-30 ENCOUNTER — Encounter: Payer: Medicare Other | Admitting: Internal Medicine

## 2019-08-30 ENCOUNTER — Encounter: Payer: Self-pay | Admitting: Urology

## 2019-08-30 DIAGNOSIS — J849 Interstitial pulmonary disease, unspecified: Secondary | ICD-10-CM

## 2019-08-30 MED ORDER — KETOCONAZOLE 2 % EX CREA: 1.0000 "application " | TOPICAL_CREAM | Freq: Two times a day (BID) | CUTANEOUS | 11 refills | Status: DC

## 2019-08-30 NOTE — Research (Signed)
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 Describe the natural history of chronic fibrosing ILDs with progressive phenotype  . Assess quality of life from self-administered participant reported questionnaires for each disease group  . Describe participant interactions with the healthcare system, describe treatment practices across multiple institutions for each disease group  . Collect biological samples linked to well characterized chronic fibrosing ILDs with progressive phenotype to identify disease biomarkers  . Collect data and biological samples that will support future research studies.                                            Key Inclusion Criteria: Willing and able to provide informed consent  Age ? 30 years  Diagnosis of a non-IPF ILD of any duration, including, but not limited to Idiopathic Non-Specific Interstitial Pneumonia (INSIP), Unclassifiable Idiopathic Interstitial Pneumonias (IIPs), Interstitial Pneumonia with Autoimmune Features (IPAF), Autoimmune ILDs such as Rheumatoid Arthritis (RA-ILD) and Systemic Sclerosis (SSC-ILD), Chronic Hypersensitivity Pneumonitis (HP), Sarcoidosis or Exposure-related ILDs such as asbestosis.  Chronic fibrosing ILD defined by reticular abnormality with traction bronchiectasis with or without honeycombing confirmed by chest HRCT scan and/or lung biopsy.  Progressive phenotype as defined by fulfilling at least one of the criteria below of fibrotic changes (progression set point) within the last 24  months regardless of treatment considered appropriate in individual ILDs:  . decline in FVC % predicted (% pred) based on >10% relative decline  . decline in FVC % pred based on ? 5 - <10% relative decline in FVC combined with worsening of respiratory symptoms as assessed by the site investigator  . decline in FVC % pred based on ? 5 - <10% relative decline in FVC combined with increasing extent of fibrotic changes on chest imaging (HRCT scan) as assessed by the site investigator  . decline in DLCO % pred based on ? 10% relative decline  . worsening of respiratory symptoms as well as increasing extent of fibrotic changes on chest imaging (HRCT scan) as assessed by the site investigator independent of FVC change.    Key Exclusion Criteria: Malignancy, treated or untreated, other than skin or early stage prostate cancer, within the past 5 years  Currently listed for lung transplantation at the time of enrollment  Currently enrolled in a clinical trial at the time of enrollment in this registry    Clinical Research Coordinator / Research RN note : This visit for Subject Cameron [name] with DOB: March 20, 1941 on 08/30/2019 for the above protocol is Visit/Encounter # Baseline/Enrollment  and is for purpose of research. The consent for this encounter is under Protocol Version Amendment 4 [phone]) and is currently IRB approved. Subject expressed continued interest and consent in continuing as a study subject. Subject confirmed that there was no change in contact information (e.g. address, telephone, email). Subject thanked for participation in research and contribution to science.   During this visit on 08/30/2019, the subject met with me in the office to discuss the protocol ICF and sign. The study coordinator went  over the entire ICF with the subject and the subject was given ample time to read and review the ICF. After review of the ICF,all questions were answered to the subject's satisfaction.Cameron  Parrettreviewed the ICF with the patient, explained the purpose of research, and asked if there were any additional questions. He stated that thestudycoordinatorhad explained the study to him thoroughly and stated he had no additional questions. The subject,study coordinator,and PI signed the ICF and the subject was given a signed copy of the ICF for her records. After consent all study related procedures were conducted as per theabovestated protocol. For additional information on today's visit,please refer to subject's paper source binder.    Signed by  [place] Assistant [facility]  [place], [place] 10:20 AM 08/30/2019

## 2019-09-05 ENCOUNTER — Ambulatory Visit (INDEPENDENT_AMBULATORY_CARE_PROVIDER_SITE_OTHER): Payer: Medicare Other | Admitting: Physician Assistant

## 2019-09-05 ENCOUNTER — Encounter: Payer: Self-pay | Admitting: Physician Assistant

## 2019-09-05 ENCOUNTER — Other Ambulatory Visit: Payer: Self-pay

## 2019-09-05 VITALS — BP 132/60 | HR 64 | Ht 67.0 in | Wt 190.0 lb

## 2019-09-05 DIAGNOSIS — Z950 Presence of cardiac pacemaker: Secondary | ICD-10-CM

## 2019-09-05 DIAGNOSIS — I480 Paroxysmal atrial fibrillation: Secondary | ICD-10-CM

## 2019-09-05 DIAGNOSIS — E039 Hypothyroidism, unspecified: Secondary | ICD-10-CM

## 2019-09-05 DIAGNOSIS — I10 Essential (primary) hypertension: Secondary | ICD-10-CM

## 2019-09-05 DIAGNOSIS — I2510 Atherosclerotic heart disease of native coronary artery without angina pectoris: Secondary | ICD-10-CM

## 2019-09-05 DIAGNOSIS — G4733 Obstructive sleep apnea (adult) (pediatric): Secondary | ICD-10-CM | POA: Diagnosis not present

## 2019-09-05 DIAGNOSIS — Z9989 Dependence on other enabling machines and devices: Secondary | ICD-10-CM

## 2019-09-05 DIAGNOSIS — E119 Type 2 diabetes mellitus without complications: Secondary | ICD-10-CM

## 2019-09-05 NOTE — Progress Notes (Signed)
Cardiology Office Note:    Date:  09/07/2019   ID:  Cameron [name], DOB May 15, 1941, MRN [id number]  PCP:  [name], MD  Cardiologist:  [name], MD  Electrophysiologist:  [name], MD   Referring MD: [name], MD   Chief Complaint  Patient presents with  . Follow-up    seen for Dr. Claiborne Billings.     History of Present Illness:    Cameron [name] is a 79 y.o. male with a hx of CAD, hypothyroidism, complete AV block s/p PPM, OSA on CPAP, atrial fibrillation, HTN, DM II and RA.Patient was previously seen by [name] for shortness of breath. Chest CT showed mild subpleural reticular densities in the posterior lateral aspect of both lower lobe concerning for mild fibrotic interstitial lung disease. 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 He also had severe disease in the proximal left circumflex, proximal OM1, proximal D3.His dyspnea was felt to be a combination of lung disease and coronary artery disease. He ended up having successful DES x2placed the OM1 and left circumflex on 08/11/2017.Last echocardiogram obtained on 09/17/2017 showed EF 55 to 60%, grade 1 DD, mild LVH, mild to moderate MR, moderate TR, PA peak pressure 41 mmHg. Pulmonary function test obtained on 11/03/2017 showed diffusion defect consistent with pulmonary vascular status. Carotid ultrasound obtained on 11/19/2017 showed minimal to moderate amount of bilateral atherosclerotic plaque.Head CT without contrast on the same day showed no evidence of acute intracranial abnormality, mild atrophy and chronic small vessel white matter ischemic changes. He was seen by [name] on 93/12/1015, Bystolic was reduced to 2.5 mg daily. Device interrogation on the day showed less than 1% AT/AF burden, maximum duration 16 seconds. No high  ventricular rates noted. Estimated longevity 4.2-4.7 years. Device programmed to optimize intrinsic conduction.Cervical CT obtained in October 2019 showed cervical spinal stenosis.  I last saw the patient in January 2021 at which time he was having worsening dyspnea on exertion.  Repeat echocardiogram showed EF 50 to 55%, grade 2 DD, trace AI, mild MR, moderately elevated pulmonary arterial systolic pressure.  He was last seen virtually by Dr. Claiborne Billings in February 2021, he recommended further up titration of lisinopril to 20 mg daily.  Bystolic was increased from 5 mg to 10 mg.  He went to the ED on 08/02/2019 with hyperkalemia with potassium of 6.3.  Recheck basic metabolic panel shows potassium was actually 5.2.  Since increasing the lisinopril, his blood pressure has improved and so was his breathing as well.  He has no lower extremity edema, orthopnea or PND.  He is able to walk a mile 3 times a week then after he come back he will continue to work in his shop.  Given the amount of exercise he is doing, I think he is doing quite well.  At this point I do not recommend any further work-up.   Past Medical History:  Diagnosis Date  . Appendicitis   . Atrial fibrillation ([place]) 12/12/2013  . BPH (benign prostatic hypertrophy)   . CAD (coronary artery disease) 12/2015   Cath by Dr [name] reveals distal and small vessel CAD.  Medical therapy advised.  . Chest pain 12/03/2015  . CHF (congestive heart failure) ([place])   . 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 Coronary artery disease   .  Coronary artery disease involving native coronary artery of native heart with unstable angina pectoris ([place]) 08/11/2017  . History of blood transfusion 1968   "probably; related to getting wounded in [place]"  . History of kidney stones   . Hyperglycemia 11/05/2013  . Hyperlipidemia 11/05/2013  . Hypertension   . Hypothyroidism   . Hypothyroidism, unspecified 11/05/2013  . Inflammatory arthritis 11/05/2013  .  Onychomycosis 12/20/2015  . OSA (obstructive sleep apnea) 10/26/2017    AHI of 8.1/h overall and 6.2/h during REM sleep.  AHI was 20/h while supine.  Oxygen saturations dropped to 87%.  Now on CPAP at 7cm H2O  . OSA on CPAP   . 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 Rheumatoid arthritis ([place])    "hands" (08/11/2017)  . Type II diabetes mellitus ([place])     Past Surgical History:  Procedure Laterality Date  . BACK SURGERY    . CARDIAC CATHETERIZATION N/A 12/28/2015   Procedure: Left Heart Cath and Coronary Angiography;  Surgeon: Belva Crome, MD;  Location: [facility];  Service: Cardiovascular;  Laterality: N/A;  . CATARACT EXTRACTION W/ INTRAOCULAR LENS  IMPLANT, BILATERAL Bilateral   . CORONARY ANGIOPLASTY WITH STENT PLACEMENT  08/11/2017   "2 stents"  . CORONARY STENT INTERVENTION N/A 08/11/2017   Procedure: CORONARY STENT INTERVENTION;  Surgeon: Troy Sine, MD;  Location: [facility];  Service: Cardiovascular;  Laterality: N/A;  . CYSTOSCOPY W/ STONE MANIPULATION    . INGUINAL HERNIA REPAIR Left   . INSERT / REPLACE / REMOVE PACEMAKER  12/09/2011   SJM Accent DR RF implanted by DR Allred for complete heart block and syncope  . JOINT REPLACEMENT    . LAPAROSCOPIC CHOLECYSTECTOMY    . LITHOTRIPSY    . LUMBAR DISC SURGERY     "removed arthritis and spurs"  . PERMANENT PACEMAKER INSERTION N/A 12/09/2011   Procedure: PERMANENT PACEMAKER INSERTION;  Surgeon: [name], MD;  [facility];  Service: Cardiovascular;  Laterality: N/A;  . REPLACEMENT TOTAL KNEE Right   . RIGHT/LEFT HEART CATH AND CORONARY ANGIOGRAPHY N/A 08/11/2017   Procedure: RIGHT/LEFT HEART CATH AND CORONARY ANGIOGRAPHY;  Surgeon: Larey Dresser, MD;  Location: [facility];  Service: Cardiovascular;  Laterality: N/A;  . TRANSURETHRAL RESECTION OF PROSTATE  2017/2018    Current Medications: Current Meds  Medication Sig  . acetaminophen  (TYLENOL) 500 MG tablet Take 500 mg by mouth as needed.   Marland Kitchen aspirin 81 MG chewable tablet Chew by mouth daily.  . betamethasone dipropionate (DIPROLENE) 0.05 % ointment Apply topically 2 (two) times daily as needed (Rash). Apply to Elbow, hip and outer buttock area twice daily for up to 3 weeks then weekends only  . BYSTOLIC 10 MG tablet Take 10 mg by mouth daily.   . calcipotriene (DOVONOX) 0.005 % cream Apply topically 2 (two) times daily. Apply to all affected areas all over  . clopidogrel (PLAVIX) 75 MG tablet TAKE ONE TABLET BY MOUTH EVERY MORNING WITH BREAKFAST  . gabapentin (NEURONTIN) 300 MG capsule Take 300 mg by mouth at bedtime.  Marland Kitchen glimepiride (AMARYL) 2 MG tablet Take 2 mg by mouth every morning.  Marland Kitchen HYDROcodone-acetaminophen (NORCO/VICODIN) 5-325 MG tablet Take 1 tablet by mouth every 6 (six) hours as needed for moderate pain.  . hydrocortisone 2.5 % cream Apply topically 2 (two) times daily as needed (Rash). Apply to buttock crease and inner thightfor up to 3 weeks then weekends  only  . ketoconazole (NIZORAL) 2 % cream Apply 1 application topically 2 (two) times daily as needed (rash). Apply to inner thigh and buttocks crease twice daily until rash is cleared  . leflunomide (ARAVA) 10 MG tablet Take 1 tablet by mouth daily.  Marland Kitchen lisinopril (ZESTRIL) 20 MG tablet Take 1 tablet (20 mg total) by mouth daily. *NEEDS OFFICE VISIT FOR FURTHER REFILLS*  . metFORMIN (GLUCOPHAGE) 500 MG tablet Take 500 mg by mouth 2 (two) times daily.  . nitroGLYCERIN (NITROSTAT) 0.4 MG SL tablet DISSOLVE 1 TABLET UNDER THE TONGUE EVERY 5 MINUTES AS NEEDED FOR CHEST PAIN.  Marland Kitchen pantoprazole (PROTONIX) 40 MG tablet Take 40 mg by mouth 2 (two) times daily.  . pravastatin (PRAVACHOL) 20 MG tablet Take 1 tablet (20 mg total) by mouth every evening. *NEEDS OFFICE VISIT FOR FURTHER REFILLS*  . tamsulosin (FLOMAX) 0.4 MG CAPS capsule Take 1 capsule (0.4 mg total) by mouth daily.  Marland Kitchen thyroid (ARMOUR) 90 MG tablet Take 90 mg  by mouth every morning.   . trospium (SANCTURA) 20 MG tablet Take 20 mg by mouth 2 (two) times daily.     Allergies:   Celecoxib   Social History   Socioeconomic History  . Marital status: Married    Spouse name: Not on file  . Number of children: Not on file  . Years of education: Not on file  . Highest education level: Not on file  Occupational History  . Not on file  Tobacco Use  . Smoking status: Former Smoker    Packs/day: 1.00    Years: 5.00    Pack years: 5.00    Types: Cigarettes    Quit date: 07/10/1972    Years since quitting: 47.1  . Smokeless tobacco: Former Systems developer    Types: Chew    Quit date: 1975  Substance and Sexual Activity  . Alcohol use: Yes    Alcohol/week: 1.0 standard drinks    Types: 1 Glasses of wine per week  . Drug use: No  . Sexual activity: Not Currently  Other Topics Concern  . Not on file  Social History Narrative  . Not on file   Social Determinants of Health   Financial Resource Strain:   . Difficulty of Paying Living Expenses:   Food Insecurity:   . Worried About Charity fundraiser in the Last Year:   . Arboriculturist in the Last Year:   Transportation Needs:   . Film/video editor (Medical):   Marland Kitchen Lack of Transportation (Non-Medical):   Physical Activity:   . Days of Exercise per Week:   . Minutes of Exercise per Session:   Stress:   . Feeling of Stress :   Social Connections:   . Frequency of Communication with Friends and Family:   . Frequency of Social Gatherings with Friends and Family:   . Attends Religious Services:   . Active Member of Clubs or Organizations:   . Attends Archivist Meetings:   Marland Kitchen Marital Status:      Family History: The patient's family history includes Alzheimer's disease in his paternal grandmother and sister; Arthritis in his maternal grandfather; Bone cancer in his father; Heart attack in his paternal uncle; Lung cancer in his paternal grandfather; Pancreatic cancer in his  mother.  ROS:   Please see the history of present illness.     All other systems reviewed and are negative.  EKGs/Labs/Other Studies Reviewed:    The following studies were reviewed  today:  Echo 06/07/2019 1. Left ventricular ejection fraction, by visual estimation, is 50 to  55%. The left ventricle has normal function. There is no left ventricular  hypertrophy.  2. Elevated left atrial pressure.  3. Left ventricular diastolic parameters are consistent with Grade II  diastolic dysfunction (pseudonormalization).  4. The left ventricle has no regional wall motion abnormalities.  5. Global right ventricle has normal systolic function.The right  ventricular size is mildly enlarged.  6. Left atrial size was normal.  7. Right atrial size was normal.  8. Mild mitral annular calcification.  9. The mitral valve is normal in structure. Mild mitral valve  regurgitation. No evidence of mitral stenosis.  10. The tricuspid valve is normal in structure.  11. The aortic valve is tricuspid. Aortic valve regurgitation is trivial.  Mild aortic valve sclerosis without stenosis.  12. The pulmonic valve was normal in structure. Pulmonic valve  regurgitation is trivial.  13. Moderately elevated pulmonary artery systolic pressure.  14. A pacer wire is visualized.  15. The inferior vena cava is normal in size with greater than 50%  respiratory variability, suggesting right atrial pressure of 3 mmHg.  16. Normal LV systolic function; grade 2 diastolic dysfunction; trace AI;  mild MR; mild RVE; moderate pulmonary hypertension.   EKG:  EKG is not ordered today.    Recent Labs: 08/02/2019: BUN 28; Creatinine, Ser 1.26; Hemoglobin 13.4; Platelets 188; Potassium 5.2; Sodium 137  Recent Lipid Panel No results found for: CHOL, TRIG, HDL, CHOLHDL, VLDL, LDLCALC, LDLDIRECT  Physical Exam:    VS:  BP 132/60   Pulse 64   Ht _0  (1.702 m)   Wt 190 lb (86.2 kg)   SpO2 97%   BMI 29.76 kg/m      Wt Readings from Last 3 Encounters:  09/05/19 190 lb (86.2 kg)  08/29/19 185 lb (83.9 kg)  08/04/19 192 lb 12.8 oz (87.5 kg)     GEN:  Well nourished, well developed in no acute distress HEENT: Normal NECK: No JVD; No carotid bruits LYMPHATICS: No lymphadenopathy CARDIAC: RRR, no murmurs, rubs, gallops RESPIRATORY:  Clear to auscultation without rales, wheezing or rhonchi  ABDOMEN: Soft, non-tender, non-distended MUSCULOSKELETAL:  No edema; No deformity  SKIN: Warm and dry NEUROLOGIC:  Alert and oriented x 3 PSYCHIATRIC:  Normal affect   ASSESSMENT:    1. Coronary artery disease involving native coronary artery of native heart without angina pectoris   2. Hypothyroidism, unspecified type   3. Pacemaker   4. OSA on CPAP   5. PAF (paroxysmal atrial fibrillation) ([place])   6. Essential hypertension   7. Controlled type 2 diabetes mellitus without complication, without long-term current use of insulin (HCC)    PLAN:    In order of problems listed above:  1. CAD: Denies any recent chest pain.  Her last PCI was in 2019.  Despite his recent dyspnea, echocardiogram was normal.  Since last visit, his shortness of breath has improved.  He is exercising regularly.  2. Hypothyroidism: Managed by primary care provider  3. History of pacemaker: Followed by electrophysiology service  4. Obstructive sleep apnea: On CPAP therapy  5. PAF: Not on anticoagulation therapy due to lack of recurrence.  6. Hypertension: Blood pressure well controlled after the last medication titration.  7. DM2: Managed by primary care provider   Medication Adjustments/Labs and Tests Ordered: Current medicines are reviewed at length with the patient today.  Concerns regarding medicines are outlined above.  No orders of  the defined types were placed in this encounter.  No orders of the defined types were placed in this encounter.   Patient Instructions  Medication Instructions:  Your physician  recommends that you continue on your current medications as directed. Please refer to the Current Medication list given to you today.  *If you need a refill on your cardiac medications before your next appointment, please call your pharmacy*  Lab Work: NONE ordered at this time of appointment   If you have labs (blood work) drawn today and your tests are completely normal, you will receive your results only by: Marland Kitchen MyChart Message (if you have MyChart) OR . A paper copy in the mail If you have any lab test that is abnormal or we need to change your treatment, we will call you to review the results.  Testing/Procedures: NONE ordered at this time of appointment   Follow-Up: At [facility], you and your health needs are our priority.  As part of our continuing mission to provide you with exceptional heart care, we have created designated Provider Care Teams.  These Care Teams include your primary Cardiologist (physician) and Advanced Practice Providers (APPs -  Physician Assistants and Nurse Practitioners) who all work together to provide you with the care you need, when you need it.  Your next appointment:   5-6 month(s)  The format for your next appointment:   In Person  Provider:   [name], MD  Other Instructions      Signed, [name], [place]  09/07/2019 11:16 PM    [place]

## 2019-09-05 NOTE — Patient Instructions (Signed)
Medication Instructions:  Your physician recommends that you continue on your current medications as directed. Please refer to the Current Medication list given to you today.  *If you need a refill on your cardiac medications before your next appointment, please call your pharmacy*  Lab Work: NONE ordered at this time of appointment   If you have labs (blood work) drawn today and your tests are completely normal, you will receive your results only by: Marland Kitchen MyChart Message (if you have MyChart) OR . A paper copy in the mail If you have any lab test that is abnormal or we need to change your treatment, we will call you to review the results.  Testing/Procedures: NONE ordered at this time of appointment   Follow-Up: At [facility], you and your health needs are our priority.  As part of our continuing mission to provide you with exceptional heart care, we have created designated Provider Care Teams.  These Care Teams include your primary Cardiologist (physician) and Advanced Practice Providers (APPs -  Physician Assistants and Nurse Practitioners) who all work together to provide you with the care you need, when you need it.  Your next appointment:   5-6 month(s)  The format for your next appointment:   In Person  Provider:   [name], MD  Other Instructions

## 2019-09-07 ENCOUNTER — Encounter: Payer: Self-pay | Admitting: Physician Assistant

## 2019-09-08 LAB — POCT SKIN KOH: Skin KOH, POC: NEGATIVE

## 2019-09-26 ENCOUNTER — Encounter: Payer: Self-pay | Admitting: Urology

## 2019-09-26 ENCOUNTER — Other Ambulatory Visit: Payer: Self-pay

## 2019-09-26 ENCOUNTER — Ambulatory Visit (INDEPENDENT_AMBULATORY_CARE_PROVIDER_SITE_OTHER): Payer: Medicare Other | Admitting: Urology

## 2019-09-26 VITALS — BP 156/83 | HR 99 | Ht 67.0 in | Wt 180.0 lb

## 2019-09-26 DIAGNOSIS — N471 Phimosis: Secondary | ICD-10-CM | POA: Diagnosis not present

## 2019-09-26 NOTE — Progress Notes (Signed)
09/26/2019 8:35 AM   Cameron [name] 01/12/1941 [id number]  Referring provider: [name], MD [facility] [place],  [place] [address]  Chief Complaint  Patient presents with  . Follow-up    HPI: 79 y.o. male with phimosis and recurrent balanitis who elected dorsal slit which was performed on 08/29/2019.  He has no complaints today and states he is doing well.   PMH: Past Medical History:  Diagnosis Date  . Appendicitis   . Atrial fibrillation ([place]) 12/12/2013  . BPH (benign prostatic hypertrophy)   . CAD (coronary artery disease) 12/2015   Cath by Dr [name] reveals distal and small vessel CAD.  Medical therapy advised.  . Chest pain 12/03/2015  . CHF (congestive heart failure) ([place])   . 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 Coronary artery disease   . Coronary artery disease involving native coronary artery of native heart with unstable angina pectoris ([place]) 08/11/2017  . History of blood transfusion 1968   "probably; related to getting wounded in [place]"  . History of kidney stones   . Hyperglycemia 11/05/2013  . Hyperlipidemia 11/05/2013  . Hypertension   . Hypothyroidism   . Hypothyroidism, unspecified 11/05/2013  . Inflammatory arthritis 11/05/2013  . Onychomycosis 12/20/2015  . OSA (obstructive sleep apnea) 10/26/2017    AHI of 8.1/h overall and 6.2/h during REM sleep.  AHI was 20/h while supine.  Oxygen saturations dropped to 87%.  Now on CPAP at 7cm H2O  . OSA on CPAP   . 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 Rheumatoid arthritis ([place])    "hands" (08/11/2017)  . Type II diabetes mellitus ([place])     Surgical History: Past Surgical History:  Procedure Laterality Date  . BACK SURGERY    . CARDIAC CATHETERIZATION N/A 12/28/2015   Procedure: Left Heart Cath and Coronary Angiography;  Surgeon: Belva Crome, MD;  Location: [place] [facility];  Service: Cardiovascular;  Laterality: N/A;    . CATARACT EXTRACTION W/ INTRAOCULAR LENS  IMPLANT, BILATERAL Bilateral   . CORONARY ANGIOPLASTY WITH STENT PLACEMENT  08/11/2017   "2 stents"  . CORONARY STENT INTERVENTION N/A 08/11/2017   Procedure: CORONARY STENT INTERVENTION;  Surgeon: Troy Sine, MD;  Location: [place] [facility];  Service: Cardiovascular;  Laterality: N/A;  . CYSTOSCOPY W/ STONE MANIPULATION    . INGUINAL HERNIA REPAIR Left   . INSERT / REPLACE / REMOVE PACEMAKER  12/09/2011   SJM Accent DR RF implanted by DR Allred for complete heart block and syncope  . JOINT REPLACEMENT    . LAPAROSCOPIC CHOLECYSTECTOMY    . LITHOTRIPSY    . LUMBAR DISC SURGERY     "removed arthritis and spurs"  . PERMANENT PACEMAKER INSERTION N/A 12/09/2011   Procedure: PERMANENT PACEMAKER INSERTION;  Surgeon: [name], MD;  Location: [facility] CATH LAB;  Service: Cardiovascular;  Laterality: N/A;  . REPLACEMENT TOTAL KNEE Right   . RIGHT/LEFT HEART CATH AND CORONARY ANGIOGRAPHY N/A 08/11/2017   Procedure: RIGHT/LEFT HEART CATH AND CORONARY ANGIOGRAPHY;  Surgeon: Larey Dresser, MD;  Location: [place] [facility];  Service: Cardiovascular;  Laterality: N/A;  . TRANSURETHRAL RESECTION OF PROSTATE  2017/2018    Home Medications:  Allergies as of 09/26/2019      Reactions   Celecoxib Rash   Skin rash      Medication List       Accurate as of Sep 26, 2019  8:35 AM. If you have any questions, ask your nurse or doctor.        acetaminophen 500 MG tablet Commonly known as: TYLENOL Take 500 mg by mouth as needed.   aspirin 81 MG chewable tablet Chew by mouth daily.   betamethasone dipropionate 0.05 % ointment Commonly known as: DIPROLENE Apply topically 2 (two) times daily as needed (Rash). Apply to Elbow, hip and outer buttock area twice daily for up to 3 weeks then weekends only   Bystolic 10 MG tablet Generic drug: nebivolol Take 10 mg by mouth daily.   calcipotriene 0.005 % cream Commonly known as: DOVONOX Apply topically 2  (two) times daily. Apply to all affected areas all over   clopidogrel 75 MG tablet Commonly known as: PLAVIX TAKE ONE TABLET BY MOUTH EVERY MORNING WITH BREAKFAST   furosemide 20 MG tablet Commonly known as: LASIX Take 1 tablet (20 mg total) by mouth as needed (SWELLING IN LEG).   gabapentin 300 MG capsule Commonly known as: NEURONTIN Take 300 mg by mouth at bedtime.   glimepiride 2 MG tablet Commonly known as: AMARYL Take 2 mg by mouth every morning.   HYDROcodone-acetaminophen 5-325 MG tablet Commonly known as: NORCO/VICODIN Take 1 tablet by mouth every 6 (six) hours as needed for moderate pain.   hydrocortisone 2.5 % cream Apply topically 2 (two) times daily as needed (Rash). Apply to buttock crease and inner thightfor up to 3 weeks then weekends only   ketoconazole 2 % cream Commonly known as: NIZORAL Apply 1 application topically 2 (two) times daily as needed (rash). Apply to inner thigh and buttocks crease twice daily until rash is cleared   leflunomide 10 MG tablet Commonly known as: ARAVA Take 1 tablet by mouth daily.   lisinopril 20 MG tablet Commonly known as: ZESTRIL Take 1 tablet (20 mg total) by mouth daily. *NEEDS OFFICE VISIT FOR FURTHER REFILLS*   metFORMIN 500 MG tablet Commonly known as: GLUCOPHAGE Take 500 mg by mouth 2 (two) times daily.   nitroGLYCERIN 0.4 MG SL tablet Commonly known as: NITROSTAT DISSOLVE 1 TABLET UNDER THE TONGUE EVERY 5 MINUTES AS NEEDED FOR CHEST PAIN.   pantoprazole 40 MG tablet Commonly known as: PROTONIX Take 40 mg by mouth 2 (two) times daily.   pravastatin 20 MG tablet Commonly known as: PRAVACHOL Take 1 tablet (20 mg total) by mouth every evening. *NEEDS OFFICE VISIT FOR FURTHER REFILLS*   tamsulosin 0.4 MG Caps capsule Commonly known as: FLOMAX Take 1 capsule (0.4 mg total) by mouth daily.   thyroid 90 MG tablet Commonly known as: ARMOUR Take 90 mg by mouth every morning.   trospium 20 MG tablet Commonly  known as: SANCTURA Take 20 mg by mouth 2 (two) times daily.       Allergies:  Allergies  Allergen Reactions  . Celecoxib Rash    Skin rash     Family History: Family History  Problem Relation Age of Onset  . Pancreatic cancer Mother   . Bone cancer Father   . Alzheimer's disease Sister   . Heart attack Paternal Uncle   . Arthritis Maternal Grandfather   . Alzheimer's disease Paternal Grandmother   . Lung cancer Paternal Grandfather     Social History:  reports that he quit smoking about 47 years ago. His smoking use included cigarettes. He has a 5.00 pack-year smoking history. He quit smokeless tobacco use about 46 years ago.  His smokeless tobacco use included chew. He reports current  alcohol use of about 1.0 standard drinks of alcohol per week. He reports that he does not use drugs.   Physical Exam: BP (!) 156/83   Pulse 99   Ht _0  (1.702 m)   Wt 180 lb (81.6 kg)   BMI 28.19 kg/m   Constitutional:  Alert and oriented, No acute distress. HEENT: [place] AT, moist mucus membranes.  Trachea midline, no masses. Cardiovascular: No clubbing, cyanosis, or edema. Respiratory: Normal respiratory effort, no increased work of breathing. GU: Surgical site well-healed.  Glans and proximal shaft without erythema.   Assessment & Plan:   Doing well status post dorsal slit.  He is scheduled for his annual follow-up on 8/27.  He has been followed for urge incontinence and states this has almost completely resolved.   Abbie Sons, [place] [address], [place], [place] [address] [phone]

## 2019-09-28 ENCOUNTER — Ambulatory Visit: Payer: Medicare Other | Admitting: Urology

## 2019-09-30 ENCOUNTER — Ambulatory Visit: Payer: Medicare Other | Admitting: Dermatology

## 2019-10-03 ENCOUNTER — Ambulatory Visit: Payer: Medicare Other

## 2019-10-10 ENCOUNTER — Other Ambulatory Visit (HOSPITAL_COMMUNITY)
Admission: RE | Admit: 2019-10-10 | Discharge: 2019-10-10 | Disposition: A | Discharge status: Home / Self Care | Payer: Medicare Other | Source: Ambulatory Visit | Attending: Internal Medicine | Admitting: Internal Medicine

## 2019-10-10 ENCOUNTER — Other Ambulatory Visit: Payer: Self-pay

## 2019-10-10 ENCOUNTER — Ambulatory Visit
Admission: RE | Admit: 2019-10-10 | Discharge: 2019-10-10 | Disposition: A | Discharge status: Home / Self Care | Payer: Medicare Other | Source: Ambulatory Visit | Attending: Internal Medicine | Admitting: Internal Medicine

## 2019-10-10 DIAGNOSIS — J849 Interstitial pulmonary disease, unspecified: Secondary | ICD-10-CM | POA: Insufficient documentation

## 2019-10-10 DIAGNOSIS — Z01812 Encounter for preprocedural laboratory examination: Secondary | ICD-10-CM | POA: Insufficient documentation

## 2019-10-10 DIAGNOSIS — Z20822 Contact with and (suspected) exposure to covid-19: Secondary | ICD-10-CM | POA: Insufficient documentation

## 2019-10-11 LAB — SARS CORONAVIRUS 2 (TAT 6-24 HRS): SARS Coronavirus 2: NEGATIVE

## 2019-10-13 ENCOUNTER — Other Ambulatory Visit: Payer: Self-pay

## 2019-10-13 ENCOUNTER — Ambulatory Visit: Payer: Medicare Other

## 2019-10-13 DIAGNOSIS — J8489 Other specified interstitial pulmonary diseases: Secondary | ICD-10-CM

## 2019-10-13 DIAGNOSIS — J849 Interstitial pulmonary disease, unspecified: Secondary | ICD-10-CM

## 2019-10-13 LAB — PULMONARY FUNCTION TEST
DL/VA % pred: 101 %
DL/VA: 4.03 ml/min/mmHg/L
DLCO cor % pred: 87 %
DLCO cor: 19.58 ml/min/mmHg
DLCO unc % pred: 87 %
DLCO unc: 19.58 ml/min/mmHg
FEF 25-75 Pre: 2.4 L/s
FEF2575-%Pred-Pre: 136 %
FEV1-%Pred-Pre: 98 %
FEV1-Pre: 2.49 L
FEV1FVC-%Pred-Pre: 112 %
FEV6-%Pred-Pre: 92 %
FEV6-Pre: 3.08 L
FEV6FVC-%Pred-Pre: 107 %
FVC-%Pred-Pre: 86 %
FVC-Pre: 3.08 L
Pre FEV1/FVC ratio: 81 %
Pre FEV6/FVC Ratio: 100 %

## 2019-10-17 ENCOUNTER — Encounter: Payer: Self-pay | Admitting: Internal Medicine

## 2019-10-17 ENCOUNTER — Other Ambulatory Visit: Payer: Self-pay

## 2019-10-17 ENCOUNTER — Ambulatory Visit (INDEPENDENT_AMBULATORY_CARE_PROVIDER_SITE_OTHER): Payer: Medicare Other | Admitting: Internal Medicine

## 2019-10-17 VITALS — BP 138/68 | HR 111 | Temp 98.2°F | Ht 66.0 in | Wt 191.0 lb

## 2019-10-17 DIAGNOSIS — Z8739 Personal history of other diseases of the musculoskeletal system and connective tissue: Secondary | ICD-10-CM | POA: Diagnosis not present

## 2019-10-17 DIAGNOSIS — M359 Systemic involvement of connective tissue, unspecified: Secondary | ICD-10-CM

## 2019-10-17 DIAGNOSIS — J8489 Other specified interstitial pulmonary diseases: Secondary | ICD-10-CM | POA: Diagnosis not present

## 2019-10-17 DIAGNOSIS — I2510 Atherosclerotic heart disease of native coronary artery without angina pectoris: Secondary | ICD-10-CM

## 2019-10-17 DIAGNOSIS — Z77098 Contact with and (suspected) exposure to other hazardous, chiefly nonmedicinal, chemicals: Secondary | ICD-10-CM | POA: Diagnosis not present

## 2019-10-17 NOTE — Progress Notes (Signed)
PCP [name], MD  HPI   IOV 07/10/2017  Chief Complaint  Patient presents with  . Advice Only    Referred by [facility] due to SOB.  PFT done 05/28/17.  Pt has been having issues with SOB x4 months especially with exertion and has some mild chest tightness. Denies any cough.    79 year old male referred by [name] for evaluation of shortness of breath after cardiac etiologies ruled out.  He tells me that he is a remote smoker.  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  He has a long-standing history of rheumatoid arthritis followed by Dr. Jefm Bryant in [place].  He is to be on methotrexate for many years and stopped taking it because of cardiac dysfunction [he personally is convinced that methotrexate because this].  He was then on leflunomide as of 2017 but is currently not on it.  His last rheumatoid factor and CCP antibodies were negative on my personal chart review of the outside records in 2016.    Now for the last 3 or 6 months he is got insidious onset of shortness of breath that is slowly progressive.  His dyspnea on exertion relieved by rest.  Class II-3 activities.  There I  s no associated cough or orthopnea proximal nocturnal dyspnea.  He did have some edema but this got cleared up but the dyspnea is continuing to get worse.  In the last few months he has had a cardiac echo that showed pulmonary hypertension.  Did have cardiac stress test that is normal.  Had pulmonary function test that showed isolated reduction in diffusion capacity and therefore he has been referred here.  Walking desaturation test on 07/10/2017 185 feet x 3 laps on ROOM AIR:  did NOT desaturate. Rest pulse ox was 100%, final pulse ox was 98%. HR response 60/min at rest to 121/min at peak exertion. Patient Cameron [name]  Did not Desaturate < 88% . Cameron [name] did not  Desaturated  </= 3% points. Gerda Diss Paisley yes did get tachyardic   OV 08/04/2017  Chief Complaint  Patient presents with  . Follow-up    ILD    Follow-up interstitial lung disease workup  After the last visit no interim problems.  He has some chronic pedal edema that he will talk to about with his primary care physician.  He did see Dr. Jefm Bryant rheumatologist in July 15, 2017.  I reviewed his notes.  It is not specifically indicate patient has nonspecific seronegative arthritis with a differential diagnosis of seronegative rheumatoid arthritis versus psoriatic.  Patient still seems to think the root of all his problems is the methotrexate he took for over 10 years.  At this point in time there is no decompensation.  As part of the ILD workup his pulmonary function test shows mild reduction in diffusion capacity.  Correlating with this is evidence of pulmonary hypertension on the echo and high-resolution CT chest enlarged pulmonary arteries.  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  His autoimmune panel is negative.  And so the vasculitis panel and hypersensitivity pneumonitis panel.    IMPRESSION: 1. 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 Early/mild usual interstitial pneumonitis is not excluded. 2. Aortic atherosclerosis (ICD10-170.0). Coronary artery calcification. 3. Enlarged pulmonary arteries, indicative of pulmonary arterial Hypertension.- > in echo  Nov 2018: Pulmonary arteries: Systolic pressure was moderately increased.   PA peak pressure: 55 mm Hg (S). 4. Left renal stone, partially imaged.   Electronically Signed   By: Lorin Picket M.D.   On: 07/22/2017 15:03  OV 11/03/2017  Chief Complaint  Patient presents with  . Follow-up    Pt has SOB  with exertion, some dry cough.     Follow-up suspected interstitial lung disease in the setting of rheumatoid arthritis and long methotrexate intake  He presents with his wife.  At the time of last visit I was not fully convinced that he had interstitial lung disease.  His CT scan indicated presence of pulmonary hypertension and so did his echocardiogram.  Therefore I referred him back to [name] cardiology.  In the spring 2019 he did have a right heart catheterization and left heart catheterization that showed mild pulmonary hypertension but also coronary artery blockage.  He status post 2 stents.  After that his shortness of breath improved but he tells me overall his fatigue level has not improved.  In talking to him I find out that he exercises 5 times a week doing heart track 2 times a week and the other 3 days walking a mile each time and doing weight lifting.  It appears that he takes a 20-minute nap after these exercises and then feels reenergized.  His mother feels that he does not have the effort tolerance as his younger days but he denies having any symptoms of chest pain or shortness of breath or cough when he does these heavy exertion the weight lifting or walking a mile out doing heart track.  In fact in the walking desaturation test today he walked extremely fast and had no problems.  In terms of his possible interstitial lung disease he had pulmonary function test today and felt to show some improvement and on exam he does not have any crackles.  Also his wife tells me that for the last 2 months he is using CPAP for sleep apnea and this is also helping him.  Right Heart Pressures RHC Procedural Findings: Hemodynamics (mmHg) RA mean 2 RV 42/6 PA 42/8, mean 21 PCWP mean 7 LV 134/8 AO 147/52       OV 06/01/2019  Subjective:  [name], male , DOB: 1941/05/14 , age 79 y.o. , MRN: [id number] , ADDRESS: [phone] [place] [address]   06/01/2019 -   Chief  Complaint  Patient presents with  . Follow-up    Pt states he has been doing okay since last visit but states he has been having a little more SOB x4 weeks now. Pt also has occ coughing with yellow phlegm.   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 -returns for follow-up.  I personally have not seen him since the summer 2019.  He says overall he has been stable.  In August 2020 he had a CT scan of the chest that confirmed the presence of interstitial lung disease in the setting of his rheumatoid arthritis.  He was stable.  His pulmonary function test was stable.  He says now in the last 2 months has had a decline in shortness of breath.  There is also  some cough with sputum production but that has resolved.  It is definitely present with exertion but relieved by rest.  His walking desaturation test compared to 18 months ago is roughly the same except that he is very tachycardic.  He does have a pacemaker.  He has an appoint with [name] his electrophysiologist today.  His symptom scores are listed below.  SYMPTOM SCALE - ILD 06/01/2019   O2 use no  Shortness of Breath 0 -> 5 scale with 5 being worst (score 6 If unable to do)  At rest 0  Simple tasks - showers, clothes change, eating, shaving 0  Household (dishes, doing bed, laundry) 0  Shopping 0  Walking level at own pace 2  Walking keeping up with others of same age 15  Walking up Stairs 3  Walking up [place] 3  Total (40 - 48) Dyspnea Score 10  How bad is your cough? 1  How bad is your fatigue 2       ROS - per HPI     OV 08/01/2019 - telephine visit - identified with 2 person identifier. Telephone visit - limits, risks benefits explained  Subjective:  [name], male , DOB: 28-Aug-1940 , age 35 y.o. , MRN: [id number] , ADDRESS: [place] [phone]   08/01/2019 -  Follow-up  interstitial lung disease in the  setting of rheumatoid arthritis and long methotrexate intake   HPI Cameron [name] 79 y.o. - similar dyspnea compared to JAn 2021.  No better nor worse.  After last visit he has seen cardiology x2.  It appears the final conclusion is that diastolic dysfunction might be contributing to his shortness of breath.  But overall not major changes in his cardiac care.  He tells me that overall he is stable.  He had spirometry and DLCO the DLCO itself appears to be reduced compared to the recent 1 but stable compared to older ones.  The FVC suggest decline.  Patient himself feels stable.  Overall some mixed picture.  Last high-resolution CT chest was October 2020.    IMPRESSION: 1. 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 There is no significant air trapping on expiratory phase imaging. Findings are not significantly changed compared to prior examinations and remain in an "indeterminate for UIP" pattern by ATS pulmonary fibrosis criteria, differential considerations including both UIP and NSIP.  2.  Coronary artery disease and aortic atherosclerosis.  3.  Left nephrolithiasis.   Electronically Signed   By: Eddie Candle M.D.   On: 01/12/2019 15:08    OV 10/17/2019  Subjective:  [name], male , DOB: 1941/05/23 , age 81 y.o. , MRN: [id number] , ADDRESS: [phone] [place] [address]   10/17/2019 -   Chief Complaint  Patient presents with  . Follow-up    pt states sobwhen doing activities.   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 -presents for follow-up after having his spirometry and DLCO and high-resolution CT chest.  Overall  he feels stable compared to the last visit but he says definitely compared to 2 years ago his symptoms are worse.  Compared to 1 year ago his symptoms are the same.  He  is kind of leery of taking new medications.  His high-resolution CT scan of the chest indicates mild progression since 2019 February.  His pulmonary function tests also indicate progression compared to 2 years ago but fluctuant in more recent times.  He is reporting agent orange exposure and is wondering if his ILD could be related to that.  He reminded me that he is already on the ILD-pro registry study.  His next scheduled visit is in October 2021.   SYMPTOM SCALE - ILD 10/17/2019   O2 use ra  Shortness of Breath 0 -> 5 scale with 5 being worst (score 6 If unable to do)  At rest 0  Simple tasks - showers, clothes change, eating, shaving 1  Household (dishes, doing bed, laundry) 1  Shopping 0  Walking level at own pace 2  Walking up Stairs 3  Total (30-36) Dyspnea Score 7  How bad is your cough? x  How bad is your fatigue 4  How bad is nausea 0  How bad is vomiting?  0  How bad is diarrhea? 1  How bad is anxiety? 1  How bad is depression 1        Simple office walk 185 feet x  3 laps goal with forehead probe 11/03/2017  06/01/2019    O2 used Room air Room air   Number laps completed 3 3   Comments about pace Fast very  99% and 66   Resting Pulse Ox/HR 100% and 70/min 99% and 66/min   Final Pulse Ox/HR 98% and 121/min 98% and 120 min   Desaturated </= 88% no    Desaturated <= 3% points no    Got Tachycardic >/= 90/min yes    Symptoms at end of test none Mild dyspea   Miscellaneous comments veryu fast Mod pace   Results for SALIF, TAY" (MRN [id number]) as of 10/17/2019 12:07  Ref. Range 05/28/2017 13:28 11/03/2017 14:10 01/17/2019 12:39 07/21/2019 15:53 10/13/2019 15:53  FVC-Pre Latest Units: L 3.29 3.59 3.41 2.92 3.08  FVC-%Pred-Pre Latest Units: % 90 98 94 81 86  Results for DONEVAN, BILLER A "[name]" (MRN  [id number]) as of 10/17/2019 12:07  Ref. Range 05/28/2017 13:28 11/03/2017 14:10 01/17/2019 12:39 07/21/2019 15:53 10/13/2019 15:53  DLCO unc Latest Units: ml/min/mmHg 20.35 20.97 21.58 20.08 19.58  DLCO unc % pred Latest Units: % 71 74 96 89 87     High-resolution CT chest May 2021 Lungs/Pleura: Peripheral and basilar predominant subpleural interlobular and intra lobular septal thickening and ground-glass. Findings persist on prone imaging and appear similar to 01/12/2019 but may be minimally progressive from 07/22/2017. 4 mm peripheral left lower lobe nodule (14/101), stable from 07/22/2017 and considered benign. Lungs are otherwise clear. No pleural fluid. Airway is unremarkable. Mild air trapping.  Upper Abdomen: Visualized portions of the liver and adrenal glands are unremarkable. Stones are seen in the kidneys bilaterally. Spleen and visualized portions of the pancreas, stomach and bowel are grossly unremarkable. Cholecystectomy. No upper abdominal adenopathy.  Musculoskeletal: Degenerative changes in the spine. No worrisome lytic or sclerotic lesions.  IMPRESSION: 1. Pulmonary parenchymal pattern of fibrosis appears grossly stable from 01/12/2019 but may be minimally progressive from 07/22/2017. 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 Usual interstitial pneumonitis is certainly not excluded. Findings are indeterminate for UIP per consensus guidelines: Diagnosis of Idiopathic Pulmonary Fibrosis: An Official ATS/ERS/JRS/ALAT Clinical Practice Guideline. [place], Iss 5, [id number], Jan 24 2017. 2. Bilateral renal  stones. 3. Aortic atherosclerosis (ICD10-I70.0). Coronary artery calcification. 4. Enlarged pulmonic trunk, indicative of pulmonary arterial hypertension.   Electronically Signed   By: Lorin Picket M.D.   On: 10/10/2019 14:00  ROS - per HPI     has a past medical history of Appendicitis,  Atrial fibrillation ([place]) (12/12/2013), BPH (benign prostatic hypertrophy), CAD (coronary artery disease) (12/2015), Chest pain (12/03/2015), CHF (congestive heart failure) ([place]), Complete heart block ([place]), Coronary artery disease, Coronary artery disease involving native coronary artery of native heart with unstable angina pectoris ([place]) (08/11/2017), History of blood transfusion (1968), History of kidney stones, Hyperglycemia (11/05/2013), Hyperlipidemia (11/05/2013), Hypertension, Hypothyroidism, Hypothyroidism, unspecified (11/05/2013), Inflammatory arthritis (11/05/2013), Onychomycosis (12/20/2015), OSA (obstructive sleep apnea) (10/26/2017), OSA on CPAP, 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   reports that he quit smoking about 47 years ago. His smoking use included cigarettes. He has a 5.00 pack-year smoking history. He quit smokeless tobacco use about 46 years ago.  His smokeless tobacco use included chew.  Past Surgical History:  Procedure Laterality Date  . BACK SURGERY    . CARDIAC CATHETERIZATION N/A 12/28/2015   Procedure: Left Heart Cath and Coronary Angiography;  Surgeon: Belva Crome, MD;  Location: [place] [facility];  Service: Cardiovascular;  Laterality: N/A;  . CATARACT EXTRACTION W/ INTRAOCULAR LENS  IMPLANT, BILATERAL Bilateral   . CORONARY ANGIOPLASTY WITH STENT PLACEMENT  08/11/2017   "2 stents"  . CORONARY STENT INTERVENTION N/A 08/11/2017   Procedure: CORONARY STENT INTERVENTION;  Surgeon: Troy Sine, MD;  Location: [place] [facility];  Service: Cardiovascular;  Laterality: N/A;  . CYSTOSCOPY W/ STONE MANIPULATION    . INGUINAL HERNIA REPAIR Left   . INSERT / REPLACE / REMOVE PACEMAKER  12/09/2011   SJM Accent DR RF implanted by DR Allred for complete heart block and syncope  . JOINT REPLACEMENT    . LAPAROSCOPIC CHOLECYSTECTOMY    . LITHOTRIPSY    . LUMBAR DISC  SURGERY     "removed arthritis and spurs"  . PERMANENT PACEMAKER INSERTION N/A 12/09/2011   Procedure: PERMANENT PACEMAKER INSERTION;  Surgeon: [name], MD;  Location: [facility];  Service: Cardiovascular;  Laterality: N/A;  . REPLACEMENT TOTAL KNEE Right   . RIGHT/LEFT HEART CATH AND CORONARY ANGIOGRAPHY N/A 08/11/2017   Procedure: RIGHT/LEFT HEART CATH AND CORONARY ANGIOGRAPHY;  Surgeon: Larey Dresser, MD;  Location: [place] [facility];  Service: Cardiovascular;  Laterality: N/A;  . TRANSURETHRAL RESECTION OF PROSTATE  2017/2018    Allergies  Allergen Reactions  . Celecoxib Rash    Skin rash     Immunization History  Administered Date(s) Administered  . Influenza, High Dose Seasonal PF 03/26/2017  . Influenza-Unspecified 05/03/2015, 02/13/2016, 03/26/2016, 03/19/2017, 03/03/2018, 03/17/2019  . PFIZER SARS-COV-2 Vaccination 07/04/2019, 07/18/2019  . Pneumococcal Polysaccharide-23 12/10/2011, 01/30/2018  . Pneumococcal-Unspecified 03/26/2016, 02/04/2018  . Tdap 01/17/2016, 02/04/2018    Family History  Problem Relation Age of Onset  . Pancreatic cancer Mother   . Bone cancer Father   . Alzheimer's disease Sister   . Heart attack Paternal Uncle   . Arthritis Maternal Grandfather   . Alzheimer's disease Paternal Grandmother   . Lung cancer Paternal Grandfather      Current Outpatient Medications:  .  acetaminophen (TYLENOL) 500 MG tablet, Take 500 mg by mouth as needed. , Disp: , Rfl:  .  aspirin 81 MG chewable tablet, Chew by mouth daily., Disp: , Rfl:  .  betamethasone dipropionate (DIPROLENE) 0.05 % ointment, Apply topically 2 (two) times daily as needed (Rash). Apply to Elbow, hip and outer buttock area twice daily for up to 3 weeks then weekends only, Disp: 45 g, Rfl: 2 .  BYSTOLIC 10 MG tablet, Take 10 mg by mouth daily. , Disp: , Rfl:  .  calcipotriene (DOVONOX) 0.005 % cream, Apply topically 2 (two) times daily. Apply to all affected areas all over, Disp: 60  g, Rfl: 11 .  clopidogrel (PLAVIX) 75 MG tablet, TAKE ONE TABLET BY MOUTH EVERY MORNING WITH BREAKFAST, Disp: 90 tablet, Rfl: 2 .  gabapentin (NEURONTIN) 300 MG capsule, Take 300 mg by mouth at bedtime., Disp: , Rfl:  .  glimepiride (AMARYL) 2 MG tablet, Take 2 mg by mouth every morning., Disp: , Rfl:  .  HYDROcodone-acetaminophen (NORCO/VICODIN) 5-325 MG tablet, Take 1 tablet by mouth every 6 (six) hours as needed for moderate pain., Disp: 15 tablet, Rfl: 0 .  hydrocortisone 2.5 % cream, Apply topically 2 (two) times daily as needed (Rash). Apply to buttock crease and inner thightfor up to 3 weeks then weekends only, Disp: 90 g, Rfl: 2 .  ketoconazole (NIZORAL) 2 % cream, Apply 1 application topically 2 (two) times daily as needed (rash). Apply to inner thigh and buttocks crease twice daily until rash is cleared, Disp: 60 g, Rfl: 1 .  leflunomide (ARAVA) 10 MG tablet, Take 1 tablet by mouth daily., Disp: , Rfl:  .  lisinopril (ZESTRIL) 20 MG tablet, Take 1 tablet (20 mg total) by mouth daily. *NEEDS OFFICE VISIT FOR FURTHER REFILLS*, Disp: 90 tablet, Rfl: 3 .  metFORMIN (GLUCOPHAGE) 500 MG tablet, Take 500 mg by mouth 2 (two) times daily., Disp: , Rfl:  .  nitroGLYCERIN (NITROSTAT) 0.4 MG SL tablet, DISSOLVE 1 TABLET UNDER THE TONGUE EVERY 5 MINUTES AS NEEDED FOR CHEST PAIN., Disp: 75 tablet, Rfl: 1 .  pantoprazole (PROTONIX) 40 MG tablet, Take 40 mg by mouth 2 (two) times daily., Disp: , Rfl:  .  pravastatin (PRAVACHOL) 20 MG tablet, Take 1 tablet (20 mg total) by mouth every evening. *NEEDS OFFICE VISIT FOR FURTHER REFILLS*, Disp: 90 tablet, Rfl: 0 .  tamsulosin (FLOMAX) 0.4 MG CAPS capsule, Take 1 capsule (0.4 mg total) by mouth daily., Disp: 30 capsule, Rfl: 2 .  thyroid (ARMOUR) 90 MG tablet, Take 90 mg by mouth every morning. , Disp: , Rfl:  .  trospium (SANCTURA) 20 MG tablet, Take 20 mg by mouth 2 (two) times daily., Disp: , Rfl:  .  furosemide (LASIX) 20 MG tablet, Take 1 tablet (20 mg  total) by mouth as needed (SWELLING IN LEG)., Disp: 30 tablet, Rfl: 1 No current facility-administered medications for this visit.  Facility-Administered Medications Ordered in Other Visits:  .  ondansetron (ZOFRAN) 4 mg in sodium chloride 0.9 % 50 mL IVPB, 4 mg, Intravenous, Q6H PRN, Ashok Pall, MD      Objective:   Vitals:   10/17/19 1140  BP: 138/68  Pulse: (!) 111  Temp: 98.2 F (36.8 C)  TempSrc: Oral  SpO2: 96%  Weight: 191 lb (86.6 kg)  Height: _0  (1.676 m)    Estimated body mass index is 30.83 kg/m as calculated from the following:   Height as of this encounter: _1  (1.676 m).   Weight as of this encounter: 191 lb (86.6 kg).  _2 @  Filed Weights   10/17/19 1140  Weight: 191 lb (86.6 kg)     Physical Exam Well  conditioned male with visceral obesity nonfocal exam.  Other than the fact he has some mild basal crackles.  He is wearing a mask.  Alert and oriented x3.        Assessment:       ICD-10-CM   1. Interstitial lung disease due to connective tissue disease ([place])  J84.89 Pulmonary function test   M35.9   2. History of rheumatoid arthritis  Z87.39   3. 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  He is got progression.  The progression is mild.  I think antifibrotic's are indicated.  I discussed extensively the class of antifibrotic's the side effect profile.  Explained nintedanib as first-line but given his coronary artery disease perhaps it would be preferable to go to pirfenidone.  He listened to the side effect profile the risks benefits and limitations.  He does not want to do the medications.  He wants serial expectant follow-up for now.  I explained that there is unpredictability and progression although the risk of progression in the next 6 months to a year is low.  He wants to continue to follow this with supportive care expectant approach.  We will see him again in 6  months with repeat PFTs.  We will get a simple walk test at that time.    Plan:     Patient Instructions  Interstitial lung disease due to connective tissue disease ([place]) History of rheumatoid arthritis   - Pulmonary Fibrosis is every so slowly and steadiy progressive -You are in the ILD-pro registry study [I just confirmed]  Plan  -We discussed taking antifibrotic's extensively but took a shared decision making to hold off -We have decided to follow you expectantly -Do spirometry and DLCO in 6 months  H/O agent Orange exposure  -This should be addressed with the [facility]  Follow-up -Return to see [name] in 6 months but after spirometry and DLCO  -We will do the ILD-pro registry visit at that time as well  -Do symptom score and simple walk test    (Level 04: Estb 30-39 min  visit type: on-site physical face to visit visit spent in total care time and counseling or/and coordination of care by this undersigned MD - Dr Brand Males. This includes one or more of the following on this same day 10/17/2019: pre-charting, chart review, note writing, documentation discussion of test results, diagnostic or treatment recommendations, prognosis, risks and benefits of management options, instructions, education, compliance or risk-factor reduction. It excludes time spent by the [place] or office staff in the care of the patient . Actual time is 30 min)   SIGNATURE    Dr. Brand Males, M.D., F.C.C.P,  Pulmonary and Critical Care Medicine Staff Physician, [place] Director - Interstitial Lung Disease  Program  Pulmonary [place] [facility] [place], [place], [address]  Pager: [phone], If no answer or between  15:00h - 7:00h: call [phone] Telephone: [phone]  12:33 PM 10/17/2019

## 2019-10-17 NOTE — Patient Instructions (Addendum)
Interstitial lung disease due to connective tissue disease (H[facility]) History of rheumatoid arthritis   - Pulmonary Fibrosis is every so slowly and steadiy progressive -You are in the ILD-pro registry study [I just confirmed]  Plan  -We discussed taking antifibrotic's extensively but took a shared decision making to hold off -We have decided to follow you expectantly -Do spirometry and DLCO in 6 months  H/O agent Orange exposure  -This should be addressed with the [facility]  Follow-up -Return to see [name] in 6 months but after spirometry and DLCO  -We will do the ILD-pro registry visit at that time as well  -Do symptom score and simple walk test

## 2019-10-18 ENCOUNTER — Telehealth: Payer: Self-pay | Admitting: Internal Medicine

## 2019-10-18 ENCOUNTER — Other Ambulatory Visit: Payer: Self-pay | Admitting: Adult Health

## 2019-10-18 DIAGNOSIS — M359 Systemic involvement of connective tissue, unspecified: Secondary | ICD-10-CM

## 2019-10-18 DIAGNOSIS — J8489 Other specified interstitial pulmonary diseases: Secondary | ICD-10-CM

## 2019-10-18 NOTE — Telephone Encounter (Signed)
Spoke with patient's wife Vickie. 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 At the time of the visit, he was not interested in any therapies. Once they got home, they had a discussion and he has decided to precede forward with the medication.   I advised her that I would send a message over to MR since there are two medications available to get his preference. She verbalized understanding.   MR, please advise. Thanks!

## 2019-10-19 ENCOUNTER — Telehealth: Payer: Self-pay | Admitting: Cardiovascular Disease

## 2019-10-19 NOTE — Telephone Encounter (Signed)
Called patient wife- she states that her husband has 10 mg lisinopril, and what is at the pharmacy at 20 mg, advised that at the visit in February Dr.Kelly suggested to increase to 20, so that refill was sent to pharmacy. But a phone call in March regarding blood work- potassium level, they decreased back to 10 mg.   Patient wife is questioning if he should continue the 10 or start the 20 mg, but currently he has been just on the 10 mg.   Advised would route to MD and nurse.

## 2019-10-19 NOTE — Telephone Encounter (Signed)
Given cardiac disease - ofev is 2nd line  Plan  -   Start Esbriet (Pirfenidone) per protocol - I will sign paper work when at [facility] 10/20/19 - then needs appt at 2-3 week time point with amber for first monitoring - then 8 weeks with an app for monitorng/review

## 2019-10-19 NOTE — Telephone Encounter (Signed)
New Message     Pt c/o medication issue:  1. Name of Medication: lisinopril (ZESTRIL) 20 MG tablet  2. How are you currently taking this medication (dosage and times per day)? Pt was taking 10 mg 1x daily   3. Are you having a reaction (difficulty breathing--STAT)? No   4. What is your medication issue? Pts wife is calling and is wondering why they got a refill for this medication and it is 20 mg. Pts wife says pt was taking 10 mg    Please advise

## 2019-10-20 NOTE — Telephone Encounter (Signed)
Thank you we will start benefits investigation and discuss at appointment on 6/14.   Mariella Saa, PharmD, [place], CPP Clinical Specialty Pharmacist (Rheumatology and Pulmonology)  10/20/2019 3:16 PM

## 2019-10-20 NOTE — Telephone Encounter (Signed)
I spoke with the Cameron [name] and scheduled appt with pharm team and also will forward to them to f/u on forms for esbriet that MR is supposed to be signing. Thanks

## 2019-10-21 NOTE — Telephone Encounter (Signed)
Ok thanks and closing note

## 2019-10-21 NOTE — Telephone Encounter (Signed)
Submitted a Prior Authorization request to [employer] for [employer] via Cover My Meds. Will update once we receive a response.  (Key: [id number]) - [id number]

## 2019-10-21 NOTE — Telephone Encounter (Signed)
Continue to take the 10 mg dose

## 2019-10-25 ENCOUNTER — Encounter: Payer: Self-pay | Admitting: Pharmacist

## 2019-10-25 NOTE — Telephone Encounter (Signed)
Faxed Appeal to [employer], will update once we receive a response.  Phone:[phone] Fax:[phone]

## 2019-10-25 NOTE — Telephone Encounter (Signed)
Received a fax regarding Prior Authorization from [employer] for [place]. Authorization has been DENIED because diagnosis is not IPF.  Case# [id number]  CLINICAL APPEALS DEPARTMENT [facility] [address] [address],[place],[phone]   JDBZM:[phone] AES:[phone]

## 2019-10-25 NOTE — Telephone Encounter (Signed)
Called and spoke with pt's wife per DPR, notified that Dr.Kelly recommends the pt continue to take the 41m dose of lisinopril.  Wife asking where the confusion was if it was the pharmacy. Notified that per dr.Kelly's last office note, he had increased the pt's lisinopril to 222m Then in a phone call in march (07/25/19) regarding blood work and an increased potassium level Dr.Kelly had recommended he decrease his lisinopril back to the 1041maily. Wife verbalized understanding with medication instructions. Wife would also like for Dr.Kelly to review notes from Dr. RamChase Callerhe states her husband is in his breathing clinic and that "his lungs are better than they were 6 months ago, but not as good as they were 2 years ago." she would like Dr.Kelly to be aware.  Notified I would [facility] have [facility] review the office note from Dr. RamChase Callerife verbalized understanding with no other questions at this time.

## 2019-10-25 NOTE — Progress Notes (Signed)
Appeal for [employer]

## 2019-10-27 NOTE — Telephone Encounter (Signed)
Received letter requesting patient's signature for appeal. Called patient and discussed. Mailed patient a copy of the letter to sign and return.

## 2019-11-06 NOTE — Progress Notes (Addendum)
HPI  Cameron [name] was referred to tge pharmacy team by Dr. Chase [name] for Esbriet management. Pertinent past medical history includes ILD, complete heart block, HTN, Afib, CAD, seondary pulmonary HTN, HLD, s/p TURP, OSA, hypothyroidism, spinal cord compression, RA, OA, and BPH. Dr. Chase [name] prefers for patient to be on Esbriet > Ofev considering his history of CAD. Of note, patient has [employer] and goes to the [employer]. He received a purple heart for his service in the [place] war. Patient was exposed to agent orange.  However, patient's [employer] insurance denied Esbriet PA on 10/25/19 due to how dx is not IPF. Cameron [name], CPhT, faxed appeal on 10/25/19. On 10/27/19, pharmacy team received letter requesting patient's signature for appeal. Patient was called and infromation regarding letter was relayed to patient. Mailed patient a copy of the letter to sign and return.  Patient presents for initial appt with his wife Cameron [name]). Patient and wife have multiple questions regarding ILD, antifibrotic medications, and general medications.   OBJECTIVE Allergies  Allergen Reactions   Celecoxib Rash    Skin rash     Outpatient Encounter Medications as of 11/07/2019  Medication Sig   acetaminophen (TYLENOL) 500 MG tablet Take 500 mg by mouth as needed.    aspirin 81 MG chewable tablet Chew by mouth daily.   betamethasone dipropionate (DIPROLENE) 0.05 % ointment Apply topically 2 (two) times daily as needed (Rash). Apply to Elbow, hip and outer buttock area twice daily for up to 3 weeks then weekends only   BYSTOLIC 10 MG tablet Take 10 mg by mouth daily.    calcipotriene (DOVONOX) 0.005 % cream Apply topically 2 (two) times daily. Apply to all affected areas all over   clopidogrel (PLAVIX) 75 MG tablet TAKE ONE TABLET BY MOUTH EVERY MORNING WITH BREAKFAST   furosemide (LASIX) 20 MG tablet Take 1 tablet (20 mg total) by mouth as needed (SWELLING IN LEG).   gabapentin  (NEURONTIN) 300 MG capsule Take 300 mg by mouth at bedtime.   glimepiride (AMARYL) 2 MG tablet Take 2 mg by mouth every morning.   HYDROcodone-acetaminophen (NORCO/VICODIN) 5-325 MG tablet Take 1 tablet by mouth every 6 (six) hours as needed for moderate pain.   hydrocortisone 2.5 % cream Apply topically 2 (two) times daily as needed (Rash). Apply to buttock crease and inner thightfor up to 3 weeks then weekends only   leflunomide (ARAVA) 10 MG tablet Take 1 tablet by mouth daily.   lisinopril (ZESTRIL) 20 MG tablet Take 1 tablet (20 mg total) by mouth daily. *NEEDS OFFICE VISIT FOR FURTHER REFILLS*   metFORMIN (GLUCOPHAGE) 500 MG tablet Take 500 mg by mouth 2 (two) times daily.   nitroGLYCERIN (NITROSTAT) 0.4 MG SL tablet DISSOLVE 1 TABLET UNDER THE TONGUE EVERY 5 MINUTES AS NEEDED FOR CHEST PAIN.   pantoprazole (PROTONIX) 40 MG tablet Take 40 mg by mouth 2 (two) times daily.   pravastatin (PRAVACHOL) 20 MG tablet Take 1 tablet (20 mg total) by mouth every evening. *NEEDS OFFICE VISIT FOR FURTHER REFILLS*   tamsulosin (FLOMAX) 0.4 MG CAPS capsule Take 1 capsule (0.4 mg total) by mouth daily.   thyroid (ARMOUR) 90 MG tablet Take 90 mg by mouth every morning.    trospium (SANCTURA) 20 MG tablet Take 20 mg by mouth 2 (two) times daily.   Facility-Administered Encounter Medications as of 11/07/2019  Medication   ondansetron (ZOFRAN) 4 mg in sodium chloride 0.9 % 50 mL IVPB     Immunization History  Administered Date(s) Administered   Influenza, High Dose Seasonal PF 03/26/2017   Influenza-Unspecified 05/03/2015, 02/13/2016, 03/26/2016, 03/19/2017, 03/03/2018, 03/17/2019   PFIZER SARS-COV-2 Vaccination 07/04/2019, 07/18/2019   Pneumococcal Polysaccharide-23 12/10/2011, 01/30/2018   Pneumococcal-Unspecified 03/26/2016, 02/04/2018   Tdap 01/17/2016, 02/04/2018     HRCT (10/10/19) IMPRESSION: 1. Pulmonary parenchymal pattern of fibrosis appears grossly stable from 01/12/2019  but may be minimally progressive from 07/22/2017. 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 Usual interstitial pneumonitis is certainly not excluded. Findings are indeterminate for UIP per consensus guidelines: Diagnosis of Idiopathic Pulmonary Fibrosis: An Official ATS/ERS/JRS/ALAT Clinical Practice Guideline. [place], Iss 5, [id number], Jan 24 2017. 2. Bilateral renal stones. 3. Aortic atherosclerosis (ICD10-I70.0). Coronary artery calcification. 4. Enlarged pulmonic trunk, indicative of pulmonary arterial hypertension.      PFT's TLC  Date Value Ref Range Status  05/28/2017 5.47 L Final     CMP     Component Value Date/Time   NA 137 08/02/2019 1836   NA 139 08/01/2019 0930   NA 136 12/09/2011 1046   K 5.2 (H) 08/02/2019 1836   K 4.2 12/09/2011 1046   CL 104 08/02/2019 1836   CL 101 12/09/2011 1046   CO2 26 08/02/2019 1836   CO2 25 12/09/2011 1046   GLUCOSE 70 08/02/2019 1836   GLUCOSE 242 (H) 12/09/2011 1046   BUN 28 (H) 08/02/2019 1836   BUN 26 08/01/2019 0930   BUN 31 (H) 12/09/2011 1046   CREATININE 1.26 (H) 08/02/2019 1836   CREATININE 1.10 12/26/2015 0828   CALCIUM 9.4 08/02/2019 1836   CALCIUM 8.8 12/09/2011 1046   PROT 7.1 12/09/2011 1046   ALBUMIN 3.5 12/09/2011 1046   AST 22 12/09/2011 1046   ALT 28 12/09/2011 1046   ALKPHOS 67 12/09/2011 1046   BILITOT 0.7 12/09/2011 1046   GFRNONAA 54 (L) 08/02/2019 1836   GFRNONAA 58 (L) 12/09/2011 1046   GFRAA >60 08/02/2019 1836   GFRAA >60 12/09/2011 1046     CBC    Component Value Date/Time   WBC 9.2 08/02/2019 1836   RBC 4.39 08/02/2019 1836   HGB 13.4 08/02/2019 1836   HGB 13.7 04/02/2017 1204   HCT 40.7 08/02/2019 1836   HCT 40.5 04/02/2017 1204   PLT 188 08/02/2019 1836   PLT 209 04/02/2017 1204   MCV 92.7 08/02/2019 1836   MCV 92 04/02/2017 1204   MCV 96 12/09/2011 1046   MCH 30.5 08/02/2019 1836   MCHC 32.9 08/02/2019 1836   RDW  13.3 08/02/2019 1836   RDW 13.8 04/02/2017 1204   RDW 14.6 (H) 12/09/2011 1046   LYMPHSABS 2.7 11/10/2018 0810   MONOABS 0.7 11/10/2018 0810   EOSABS 0.3 11/10/2018 0810   BASOSABS 0.1 11/10/2018 0810     LFT's Hepatic Function Latest Ref Rng & Units 12/09/2011  Total Protein 6.4 - 8.2 g/dL 7.1  Albumin 3.4 - 5.0 g/dL 3.5  AST 15 - 37 Unit/L 22  ALT U/L 28  Alk Phosphatase 50 - 136 Unit/L 67  Total Bilirubin 0.2 - 1.0 mg/dL 0.7    ASSESSMENT  1. Esbriet Medication Management Patient counseled on purpose, proper use, and potential adverse effects including nausea, vomiting, abdominal pain, GERD, weight loss, arthralgia, dizziness, and suns sensitivity/rash.  Stressed the importance of routine lab monitoring. Will monitor LFT's every month for the first 6 months of treatment then every 3 months. Will monitor CBC every 3 months. Most recent CBC WNL (08/02/19). Patient is  due for updated LFTs prior to Esbriet initiation - lab results were WNL. In the future, patient would prefer to get labs in [place] (either at [facility] or at his rheumatologist (Dr. Jefm Bryant).  Starting dose will be Esbriet 267 mg 1 tablet three times daily for 7 days, then 2 tablets three times daily for 7 days, then 3 tablets three times daily.  Maintenance dose will be 801 mg 1 tablet three times daily if tolerated.  Stressed the importance of taking with meals to minimize stomach upset.    Patient signed Jacklynn Bue PA appeal form and Esbriet PAP via [place] form.  Provided patient with hard-copy of prior office visit note with Dr. Chase [name] on 10/17/2019 to provide to the [facility] for insurance coverage (also known as service connection at [place]).   2. Medication Reconciliation  A drug regimen assessment was performed, including review of allergies, interactions, disease-state management, dosing and immunization history. Medications were reviewed with the patient, including name, instructions, indication, goals of therapy,  potential side effects, importance of adherence, and safe use.  Drug interaction(s): no concerning interactions identified  Wrote down indication for each medication for patient and answered medication related questions regarding DM/metformin use.  3. Immunizations  Patient is indicated for the shingles vaccination. Will address at follow up.  PLAN 1. Pursue [employer] coverage of Esbriet. If [employer] denies Esbriet PA appeal then will pursue financial assistance via [place] patient assistance program via [place]. 2. Notified patient that LFTs obtained today (11/07/19) were WNL. 3. Discuss shingles vaccination at follow up  All questions encouraged and answered.  Instructed patient to call with any further questions or concerns.  Thank you for allowing pharmacy to participate in this patient's care.  This appointment required 75 minutes of patient care (this includes precharting, chart review, review of results, face-to-face care, etc.).   Drexel Iha, PharmD PGY2 Ambulatory Care Pharmacy Resident

## 2019-11-07 ENCOUNTER — Encounter: Payer: Self-pay | Admitting: Pharmacist

## 2019-11-07 ENCOUNTER — Ambulatory Visit (INDEPENDENT_AMBULATORY_CARE_PROVIDER_SITE_OTHER): Payer: Medicare Other | Admitting: Pharmacist

## 2019-11-07 ENCOUNTER — Other Ambulatory Visit: Payer: Self-pay

## 2019-11-07 DIAGNOSIS — J8489 Other specified interstitial pulmonary diseases: Secondary | ICD-10-CM | POA: Diagnosis not present

## 2019-11-07 DIAGNOSIS — M359 Systemic involvement of connective tissue, unspecified: Secondary | ICD-10-CM | POA: Diagnosis not present

## 2019-11-07 LAB — HEPATIC FUNCTION PANEL
ALT: 17 U/L (ref 0–53)
AST: 19 U/L (ref 0–37)
Albumin: 4.4 g/dL (ref 3.5–5.2)
Alkaline Phosphatase: 71 U/L (ref 39–117)
Bilirubin, Direct: 0.1 mg/dL (ref 0.0–0.3)
Total Bilirubin: 0.4 mg/dL (ref 0.2–1.2)
Total Protein: 7.3 g/dL (ref 6.0–8.3)

## 2019-11-07 MED ORDER — ESBRIET 267 MG PO TABS: ORAL_TABLET | ORAL | 0 refills | Status: DC

## 2019-11-07 NOTE — Patient Instructions (Signed)
It was a pleasure seeing you in clinic today!  Today the plan is... 1. We will start Esbriet. Look at handout for dosing. -If you experience nausea please take ginger (pill in vitamin section of pharmacy, dramamine in over the counter section of pharamcy, or ginger as food) -Please remember to wear sunscreen SPF 50 everyday 2. We will be in touch about which pharmacy you will fill Esbriet at  Please call the PharmD clinic at [phone] if you have any questions that you would like to speak with a pharmacist about Stanton Kidney, Museum/gallery conservator).

## 2019-11-09 ENCOUNTER — Ambulatory Visit (INDEPENDENT_AMBULATORY_CARE_PROVIDER_SITE_OTHER): Payer: Medicare Other | Admitting: *Deleted

## 2019-11-09 DIAGNOSIS — I442 Atrioventricular block, complete: Secondary | ICD-10-CM | POA: Diagnosis not present

## 2019-11-09 LAB — CUP PACEART REMOTE DEVICE CHECK
Battery Remaining Longevity: 12 mo
Battery Remaining Percentage: 10 %
Battery Voltage: 2.71 V
Brady Statistic AP VP Percent: 97 %
Brady Statistic AP VS Percent: 1 %
Brady Statistic AS VP Percent: 2.6 %
Brady Statistic AS VS Percent: 1 %
Brady Statistic RA Percent Paced: 97 %
Brady Statistic RV Percent Paced: 99 %
Date Time Interrogation Session: 20210616020918
Implantable Lead Implant Date: 20130716
Implantable Lead Implant Date: 20130716
Implantable Lead Location: 753859
Implantable Lead Location: 753860
Implantable Lead Model: 1948
Implantable Pulse Generator Implant Date: 20130716
Lead Channel Impedance Value: 330 Ohm
Lead Channel Impedance Value: 600 Ohm
Lead Channel Pacing Threshold Amplitude: 0.5 V
Lead Channel Pacing Threshold Amplitude: 0.5 V
Lead Channel Pacing Threshold Pulse Width: 0.5 ms
Lead Channel Pacing Threshold Pulse Width: 0.5 ms
Lead Channel Sensing Intrinsic Amplitude: 12 mV
Lead Channel Sensing Intrinsic Amplitude: 2.1 mV
Lead Channel Setting Pacing Amplitude: 0.75 V
Lead Channel Setting Pacing Amplitude: 1.5 V
Lead Channel Setting Pacing Pulse Width: 0.5 ms
Lead Channel Setting Sensing Sensitivity: 4 mV
Pulse Gen Model: 2210
Pulse Gen Serial Number: 7356500

## 2019-11-09 NOTE — Telephone Encounter (Signed)
Patient had office visit 6/14 and signed form.  Faxed to [facility] along with appeal letter.    Had patient fill out patient assistance application to apply for approval to prevent a delay in starting therapy.  Will update when we hear a response.     Mariella Saa, PharmD, [place], CPP Clinical Specialty Pharmacist (Rheumatology and Pulmonology)  11/09/2019 3:09 PM

## 2019-11-10 NOTE — Progress Notes (Signed)
Remote pacemaker transmission.

## 2019-11-15 MED ORDER — ESBRIET 267 MG PO TABS: 3.0000 | ORAL_TABLET | Freq: Three times a day (TID) | ORAL | 1 refills | Status: DC

## 2019-11-15 MED ORDER — ESBRIET 267 MG PO TABS: ORAL_TABLET | ORAL | 0 refills | Status: DC

## 2019-11-15 NOTE — Telephone Encounter (Signed)
Received notification from [employer] regarding a prior authorization for ESBRIET. Authorization has been APPROVED from 11/10/19 to 11/09/20.   Authorization # [id number] Phone # [phone]  Ran test claim, patient must fill through [facility] mail order. Patient will have a $24.00 copay for 1 month supply.

## 2019-11-15 NOTE — Addendum Note (Signed)
Addended by: Mariella Saa C on: 11/15/2019 03:55 PM   Modules accepted: Orders

## 2019-11-15 NOTE — Telephone Encounter (Signed)
Called patient and notified.  Prescription sent to [facility] and given the number to call and set-up shipment.  Instructed patient to call the office if he has any issues getting the medication from the pharmacy.  Mariella Saa, PharmD, [place], CPP Clinical Specialty Pharmacist (Rheumatology and Pulmonology)  11/15/2019 3:51 PM

## 2019-11-16 ENCOUNTER — Telehealth: Payer: Self-pay | Admitting: Internal Medicine

## 2019-11-16 NOTE — Telephone Encounter (Signed)
Called and spoke with the pharmacist to update the provider name as well as the quantity for maintenance dose for 90 day supply.  Nothing further needed.

## 2019-11-21 ENCOUNTER — Telehealth: Payer: Self-pay | Admitting: Internal Medicine

## 2019-11-21 NOTE — Telephone Encounter (Signed)
Called and spoke to patient. He states [facility] mail order called him and asked questions, but did not schedule shipment. Patient will call them back and will contact office if he has any trouble scheduling 1st shipment.

## 2019-11-22 ENCOUNTER — Telehealth: Payer: Medicare Other | Admitting: Cardiology

## 2019-11-22 ENCOUNTER — Other Ambulatory Visit: Payer: Self-pay

## 2019-11-25 ENCOUNTER — Ambulatory Visit: Payer: Medicare Other | Attending: Internal Medicine

## 2019-11-25 DIAGNOSIS — Z20822 Contact with and (suspected) exposure to covid-19: Secondary | ICD-10-CM

## 2019-11-26 LAB — SARS-COV-2, NAA 2 DAY TAT

## 2019-11-26 LAB — NOVEL CORONAVIRUS, NAA: SARS-CoV-2, NAA: NOT DETECTED

## 2019-12-14 ENCOUNTER — Telehealth: Payer: Self-pay | Admitting: Internal Medicine

## 2019-12-14 ENCOUNTER — Other Ambulatory Visit: Payer: Self-pay | Admitting: Pharmacist

## 2019-12-14 DIAGNOSIS — Z79899 Other long term (current) drug therapy: Secondary | ICD-10-CM

## 2019-12-14 DIAGNOSIS — J8489 Other specified interstitial pulmonary diseases: Secondary | ICD-10-CM

## 2019-12-14 MED ORDER — ESBRIET 267 MG PO TABS: 3.0000 | ORAL_TABLET | Freq: Three times a day (TID) | ORAL | 1 refills | Status: DC

## 2019-12-14 NOTE — Telephone Encounter (Signed)
Returned patient call addressed in another encounter.

## 2019-12-14 NOTE — Progress Notes (Signed)
Returned patient call.  Patient did not have questions about Esbriet but wanted to notify the office that he has now tolerated 1 week 3 pills 3 times daily without issue.  He also wanted to confirm when he should come in for lab work.  Advised that he will be due for a hepatic panel at the end of the month and he can come to the office at his convenience during the lab hours of 8:30-1 in 2-5.  Future order placed.  Prescription for maintenance dose of 3 pills 3 times daily sent to [facility] with 1 refill.  If patient continues to tolerate we can switch to 801 mg 1 tablet 3 times daily.  Patient verbalized understanding.  All questions encouraged and answered.  Instructed patient to call with any further questions or concerns.   Mariella Saa, PharmD, [place], CPP Clinical Specialty Pharmacist (Rheumatology and Pulmonology)  12/14/2019 3:02 PM

## 2019-12-19 ENCOUNTER — Telehealth: Payer: Self-pay | Admitting: Cardiology

## 2019-12-19 ENCOUNTER — Telehealth: Payer: Self-pay | Admitting: Internal Medicine

## 2019-12-19 NOTE — Telephone Encounter (Signed)
New Message  Pts wife is calling and says the patients CPAP is blowing out too much air and he needs a new one. She says she called the company but they told her to call his doctor  Please call

## 2019-12-19 NOTE — Telephone Encounter (Signed)
Spoke with the pt's spouse  She states pt's CPAP stopped working  We did not prescribe this  Dr. Fransico Him did  I advised call her office  She verbalized understanding  Nothing further needed

## 2019-12-22 ENCOUNTER — Ambulatory Visit: Payer: Medicare Other | Admitting: Dermatology

## 2019-12-22 NOTE — Telephone Encounter (Signed)
He is on a very low pressure.  How old is his device

## 2019-12-23 NOTE — Telephone Encounter (Signed)
Patient encouraged to call with any further problems.

## 2019-12-23 NOTE — Telephone Encounter (Signed)
Reached out to patient and Cameron [name] and lmtcb on both home and cell numbers.

## 2019-12-23 NOTE — Telephone Encounter (Signed)
Return call: Patients machine is two years old.   Patient called back to say he didn't use his machine for a day and then tried it again on Thursday and it was working just fine.

## 2019-12-23 NOTE — Telephone Encounter (Signed)
Have him call if any further problems

## 2019-12-26 ENCOUNTER — Encounter: Payer: Self-pay | Admitting: Dermatology

## 2019-12-26 ENCOUNTER — Other Ambulatory Visit: Payer: Self-pay

## 2019-12-26 ENCOUNTER — Ambulatory Visit (INDEPENDENT_AMBULATORY_CARE_PROVIDER_SITE_OTHER): Payer: Medicare Other | Admitting: Dermatology

## 2019-12-26 DIAGNOSIS — L219 Seborrheic dermatitis, unspecified: Secondary | ICD-10-CM | POA: Diagnosis not present

## 2019-12-26 DIAGNOSIS — L570 Actinic keratosis: Secondary | ICD-10-CM | POA: Diagnosis not present

## 2019-12-26 DIAGNOSIS — L820 Inflamed seborrheic keratosis: Secondary | ICD-10-CM

## 2019-12-26 DIAGNOSIS — L409 Psoriasis, unspecified: Secondary | ICD-10-CM | POA: Diagnosis not present

## 2019-12-26 MED ORDER — HYDROCORTISONE 2.5 % EX CREA: TOPICAL_CREAM | Freq: Two times a day (BID) | CUTANEOUS | 2 refills | Status: DC | PRN

## 2019-12-26 MED ORDER — CLOBETASOL PROPIONATE 0.05 % EX FOAM: Freq: Two times a day (BID) | CUTANEOUS | 0 refills | Status: DC

## 2019-12-26 NOTE — Progress Notes (Signed)
Follow-Up Visit   Subjective  Cameron [name] is a 79 y.o. male who presents for the following: Follow-up  Patient presents today for follow up on OV 08/29/19 for AK's, ISK's, Psoriasis, and Seb derm. Patient is using ketoconazole cream on R elbow, left ear and left medial thigh. Using HC 2.5% cream to right elbow, Using Calcipotriene on Right elbow and Buttocks crease, inner thigh,face and axillae. Also has been using betamethasone cream to elbows  The following portions of the chart were reviewed this encounter and updated as appropriate:  Tobacco   Allergies   Meds   Problems   Med Hx   Surg Hx   Fam Hx       Review of Systems:  No other skin or systemic complaints except as noted in HPI or Assessment and Plan.  Objective  Well appearing patient in no apparent distress; mood and affect are within normal limits.  A focused examination was performed including face, scalp, ears, arms, hands, thighs, axillae, and buttocks. Relevant physical exam findings are noted in the Assessment and Plan.  Objective  Left Hip (side) - Posterior, Right Elbow - Posterior: Well-demarcated erythematous papules/plaques with silvery scale, guttate pink scaly papules at the elbows and left hip  Objective  Right Dorsal Hand: Erythematous thin papules/macules with gritty scale.   Objective  Left Ear, Forehead and eyebrows: Pink patches with greasy scale.   Objective  Right Medial Cheek: Erythematous keratotic or waxy stuck-on papule or plaque.    Assessment & Plan  Psoriasis (2) Right Elbow - Posterior; Left Hip (side) - Posterior  Reviewed chronic nature. Not at goal.  D/C betamethasone cream   Continue Calcipotriene cream twice daily to affected areas then apply Clobetasol foam twice daily to affected areas on the body or scalp avoiding face, groin and axillae for up to 3 weeks  Topical steroids (such as triamcinolone, fluocinolone, fluocinonide, mometasone, clobetasol, halobetasol,  betamethasone, hydrocortisone) can cause thinning and lightening of the skin if they are used for too long in the same area. Your physician has selected the right strength medicine for your problem and area affected on the body. Please use your medication only as directed by your physician to prevent side effects.     clobetasol (OLUX) 0.05 % topical foam - Right Elbow - Posterior  Reordered Medications hydrocortisone 2.5 % cream  Other Related Medications calcipotriene (DOVONOX) 0.005 % cream betamethasone dipropionate (DIPROLENE) 0.05 % ointment  AK (actinic keratosis) Right Dorsal Hand  Cryotherapy today Prior to procedure, discussed risks of blister formation, small wound, skin dyspigmentation, or rare scar following cryotherapy.    Destruction of lesion - Right Dorsal Hand Complexity: simple   Destruction method: cryotherapy   Informed consent: discussed and consent obtained   Timeout:  patient name, date of birth, surgical site, and procedure verified Lesion destroyed using liquid nitrogen: Yes   Region frozen until ice ball extended beyond lesion: Yes   Outcome: patient tolerated procedure well with no complications   Post-procedure details: wound care instructions given    Seborrheic dermatitis Left Ear, Forehead and eyebrows  Contiue hydrocortisone 2.5% to forehead and eyebrows twice daily as needed for up to two weeks.   Ok to use hydrocortisone or clobetasol foam to ear twice daily for up to two weeks.  Topical steroids (such as triamcinolone, fluocinolone, fluocinonide, mometasone, clobetasol, halobetasol, betamethasone, hydrocortisone) can cause thinning and lightening of the skin if they are used for too long in the same area. Your physician has  selected the right strength medicine for your problem and area affected on the body. Please use your medication only as directed by your physician to prevent side effects.    Inflamed seborrheic keratosis Right Medial  Cheek  Cryotherapy today Prior to procedure, discussed risks of blister formation, small wound, skin dyspigmentation, or rare scar following cryotherapy.    Destruction of lesion - Right Medial Cheek  Destruction method: cryotherapy   Informed consent: discussed and consent obtained   Lesion destroyed using liquid nitrogen: Yes   Outcome: patient tolerated procedure well with no complications   Post-procedure details: wound care instructions given    Return in about 2 months (around 02/25/2020) for TBSE, and Psoriasis.  IDonzetta Kohut, CMA, am acting as scribe for Forest Gleason, MD .  Documentation: I have reviewed the above documentation for accuracy and completeness, and I agree with the above.  Forest Gleason, MD

## 2019-12-26 NOTE — Patient Instructions (Addendum)
Recommend daily broad spectrum sunscreen SPF 30+ to sun-exposed areas, reapply every 2 hours as needed. Call for new or changing lesions.  Prior to procedure, discussed risks of blister formation, small wound, skin dyspigmentation, or rare scar following cryotherapy.  Liquid nitrogen was applied for 10-12 seconds to the skin lesion and the expected blistering or scabbing reaction explained. Do not pick at the area. Patient reminded to expect hypopigmented scars from the procedure. Return if lesion fails to fully resolve. Cryotherapy Aftercare  . Wash gently with soap and water everyday.   Marland Kitchen Apply Vaseline and Band-Aid daily until healed.   Topical steroids (such as triamcinolone, fluocinolone, fluocinonide, mometasone, clobetasol, halobetasol, betamethasone, hydrocortisone) can cause thinning and lightening of the skin if they are used for too long in the same area. Your physician has selected the right strength medicine for your problem and area affected on the body. Please use your medication only as directed by your physician to prevent side effects.

## 2020-01-03 ENCOUNTER — Encounter: Payer: Self-pay | Admitting: Dermatology

## 2020-01-13 ENCOUNTER — Telehealth: Payer: Self-pay | Admitting: Internal Medicine

## 2020-01-13 NOTE — Telephone Encounter (Signed)
Spoke with patient/wife regarding dosage of Esbriet.  They wanted to verify that he is to take 3 tabs (267 mg) 3 times per day.  I verified with the pharmacy note and the current prescription that was the correct dosage.  Nothing further needed.

## 2020-01-19 ENCOUNTER — Telehealth: Payer: Self-pay | Admitting: Internal Medicine

## 2020-01-19 NOTE — Telephone Encounter (Signed)
Called and spoke with pt letting him know the info stated by MR and he verbalized understanding. Nothing further needed. 

## 2020-01-19 NOTE — Telephone Encounter (Signed)
He is on ARAVA - he should get booster. Do not think he needs paperwork  Thanks  MR   Allergies  Allergen Reactions  . Celecoxib Rash    Skin rash      Immunization History  Administered Date(s) Administered  . Influenza, High Dose Seasonal PF 03/26/2017  . Influenza-Unspecified 05/03/2015, 02/13/2016, 03/26/2016, 03/19/2017, 03/03/2018, 03/17/2019  . PFIZER SARS-COV-2 Vaccination 07/04/2019, 07/18/2019  . Pneumococcal Polysaccharide-23 12/10/2011, 01/30/2018  . Pneumococcal-Unspecified 03/26/2016, 02/04/2018  . Tdap 01/17/2016, 02/04/2018      Current Outpatient Medications:  .  acetaminophen (TYLENOL) 500 MG tablet, Take 500 mg by mouth as needed. , Disp: , Rfl:  .  aspirin 81 MG chewable tablet, Chew by mouth daily., Disp: , Rfl:  .  betamethasone dipropionate (DIPROLENE) 0.05 % ointment, Apply topically 2 (two) times daily as needed (Rash). Apply to Elbow, hip and outer buttock area twice daily for up to 3 weeks then weekends only, Disp: 45 g, Rfl: 2 .  BYSTOLIC 10 MG tablet, Take 10 mg by mouth daily. , Disp: , Rfl:  .  calcipotriene (DOVONOX) 0.005 % cream, Apply topically 2 (two) times daily. Apply to all affected areas all over, Disp: 60 g, Rfl: 11 .  clobetasol (OLUX) 0.05 % topical foam, Apply topically 2 (two) times daily., Disp: 50 g, Rfl: 0 .  clopidogrel (PLAVIX) 75 MG tablet, TAKE ONE TABLET BY MOUTH EVERY MORNING WITH BREAKFAST, Disp: 90 tablet, Rfl: 2 .  furosemide (LASIX) 20 MG tablet, Take 1 tablet (20 mg total) by mouth as needed (SWELLING IN LEG)., Disp: 30 tablet, Rfl: 1 .  gabapentin (NEURONTIN) 300 MG capsule, Take 300 mg by mouth at bedtime., Disp: , Rfl:  .  glimepiride (AMARYL) 2 MG tablet, Take 2 mg by mouth every morning., Disp: , Rfl:  .  HYDROcodone-acetaminophen (NORCO/VICODIN) 5-325 MG tablet, Take 1 tablet by mouth every 6 (six) hours as needed for moderate pain. (Patient not taking: Reported on 11/07/2019), Disp: 15 tablet, Rfl: 0 .   hydrocortisone 2.5 % cream, Apply topically 2 (two) times daily as needed (Rash). Apply to buttock crease and inner thightfor up to 3 weeks then weekends only, Disp: 90 g, Rfl: 2 .  leflunomide (ARAVA) 10 MG tablet, Take 1 tablet by mouth daily., Disp: , Rfl:  .  lisinopril (ZESTRIL) 20 MG tablet, Take 1 tablet (20 mg total) by mouth daily. *NEEDS OFFICE VISIT FOR FURTHER REFILLS*, Disp: 90 tablet, Rfl: 3 .  metFORMIN (GLUCOPHAGE) 500 MG tablet, Take 500 mg by mouth 2 (two) times daily., Disp: , Rfl:  .  nitroGLYCERIN (NITROSTAT) 0.4 MG SL tablet, DISSOLVE 1 TABLET UNDER THE TONGUE EVERY 5 MINUTES AS NEEDED FOR CHEST PAIN. (Patient not taking: Reported on 11/07/2019), Disp: 75 tablet, Rfl: 1 .  OMEGA-3-ACID ETHYL ESTERS PO, Take by mouth., Disp: , Rfl:  .  pantoprazole (PROTONIX) 40 MG tablet, Take 40 mg by mouth 2 (two) times daily., Disp: , Rfl:  .  Pirfenidone (ESBRIET) 267 MG TABS, Take 3 tablets (801 mg total) by mouth 3 (three) times daily with meals., Disp: 270 tablet, Rfl: 1 .  pravastatin (PRAVACHOL) 20 MG tablet, Take 1 tablet (20 mg total) by mouth every evening. *NEEDS OFFICE VISIT FOR FURTHER REFILLS*, Disp: 90 tablet, Rfl: 0 .  tamsulosin (FLOMAX) 0.4 MG CAPS capsule, Take 1 capsule (0.4 mg total) by mouth daily., Disp: 30 capsule, Rfl: 2 .  thyroid (ARMOUR) 90 MG tablet, Take 90 mg by mouth every morning. , Disp: ,  Rfl:  .  trospium (SANCTURA) 20 MG tablet, Take 20 mg by mouth 2 (two) times daily., Disp: , Rfl:  .  TURMERIC PO, Take 1 capsule by mouth daily., Disp: , Rfl:  No current facility-administered medications for this visit.  Facility-Administered Medications Ordered in Other Visits:  .  ondansetron (ZOFRAN) 4 mg in sodium chloride 0.9 % 50 mL IVPB, 4 mg, Intravenous, Q6H PRN, Ashok Pall, MD

## 2020-01-19 NOTE — Telephone Encounter (Signed)
Called and spoke with pt's wife who wanted to know if pt would be a candidate for the covid booster. MR, please advise.

## 2020-01-20 ENCOUNTER — Ambulatory Visit (INDEPENDENT_AMBULATORY_CARE_PROVIDER_SITE_OTHER): Payer: Medicare Other | Admitting: Urology

## 2020-01-20 ENCOUNTER — Other Ambulatory Visit: Payer: Self-pay

## 2020-01-20 ENCOUNTER — Encounter: Payer: Self-pay | Admitting: Urology

## 2020-01-20 VITALS — BP 143/84 | HR 85 | Ht 67.0 in | Wt 183.0 lb

## 2020-01-20 DIAGNOSIS — N401 Enlarged prostate with lower urinary tract symptoms: Secondary | ICD-10-CM | POA: Diagnosis not present

## 2020-01-20 DIAGNOSIS — R350 Frequency of micturition: Secondary | ICD-10-CM

## 2020-01-20 DIAGNOSIS — N5201 Erectile dysfunction due to arterial insufficiency: Secondary | ICD-10-CM

## 2020-01-20 LAB — BLADDER SCAN AMB NON-IMAGING: Scan Result: 0
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
## 2020-01-22 ENCOUNTER — Encounter: Payer: Self-pay | Admitting: Urology

## 2020-01-22 MED ORDER — TAMSULOSIN HCL 0.4 MG PO CAPS: 0.4000 mg | ORAL_CAPSULE | Freq: Every day | ORAL | 3 refills | Status: DC

## 2020-01-26 ENCOUNTER — Telehealth: Payer: Self-pay | Admitting: Internal Medicine

## 2020-01-26 DIAGNOSIS — J8489 Other specified interstitial pulmonary diseases: Secondary | ICD-10-CM

## 2020-01-26 NOTE — Telephone Encounter (Signed)
Called pt's spouse Vickie but unable to reach. Left message for her to return call.

## 2020-01-31 NOTE — Telephone Encounter (Signed)
lmtcb for Vickie.

## 2020-02-02 NOTE — Telephone Encounter (Signed)
lmtcb for Vickie. Will need to verify dose and frequency before sending in medication. Will try one more time to reach pt then will need to send letter.

## 2020-02-03 MED ORDER — ESBRIET 267 MG PO TABS: 3.0000 | ORAL_TABLET | Freq: Three times a day (TID) | ORAL | 1 refills | Status: DC

## 2020-02-03 NOTE — Telephone Encounter (Signed)
Spoke with Merla Riches, listed on DPR, she called back to clarify the dosage and frequency of his Esbriet.  He is taking 267 mg, 3 tabs 3 times daily.  [facility]  Nothing further needed.

## 2020-02-07 ENCOUNTER — Telehealth: Payer: Self-pay | Admitting: Internal Medicine

## 2020-02-07 MED ORDER — PREDNISONE 10 MG PO TABS: ORAL_TABLET | ORAL | 0 refills | Status: DC

## 2020-02-07 MED ORDER — DOXYCYCLINE HYCLATE 100 MG PO TABS: 100.0000 mg | ORAL_TABLET | Freq: Two times a day (BID) | ORAL | 0 refills | Status: DC
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
## 2020-02-07 NOTE — Telephone Encounter (Signed)
Spoke with [name] patient's wife regarding prior message. Per Dr.Ramaswamy advised patient to get COVID testing done in the next 24-48 hours. Sent in script for Doxycycline 153m PO twice daily x5 days  And prednisone 450mx 2 days, then 2052maily x 2 days, then 31m51mily x 2 days, then 5mg 68mly x 2 days and stop.  Patient's wife stated she would like to discuss more information  about ILD at patient's next office visit.Vicki's voice was understanding . Nothing else further needed.

## 2020-02-07 NOTE — Telephone Encounter (Signed)
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 History of rheumatoid arthritis  Z87.39   3. 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  He is got progression.  The progression is mild.  I think antifibrotic's are indicated.  I discussed extensively the class of antifibrotic's the side effect profile.  Explained nintedanib as first-line but given his coronary artery disease perhaps it would be preferable to go to pirfenidone.  He listened to the side effect profile the risks benefits and limitations.  He does not want to do the medications.  He wants serial expectant follow-up for now.  I explained that there is unpredictability and progression although the risk of progression in the next 6 months to a year is low.  He wants to continue to follow this with supportive care expectant approach.  We will see him again in 6 months with repeat PFTs.  We will get a simple walk test at that time.    Plan:     Patient Instructions  Interstitial lung disease due to connective tissue disease ([place]) History of rheumatoid arthritis   - Pulmonary Fibrosis is every so slowly and steadiy progressive -You are in the ILD-pro registry study [I just confirmed]  Plan  -We discussed taking antifibrotic's extensively but took a shared decision making to hold off -We have decided to follow you expectantly -Do spirometry and DLCO in 6 months  H/O agent Orange exposure  -This should be addressed with the [facility]  Follow-up -Return to see [name] in 6 months but after spirometry and DLCO             -We will do the ILD-pro registry visit at that time as well              -Do symptom score and simple walk test    (Level 04: Estb 30-39 min  visit type: on-site physical face to visit visit spent in total care time and counseling or/and coordination of care by this undersigned MD - Dr Brand Males. This includes one or more of the following on this same day 10/17/2019: pre-charting, chart review, note writing, documentation discussion of test results, diagnostic or treatment recommendations, prognosis, risks and benefits of management options, instructions, education, compliance or risk-factor reduction. It excludes time spent by the [place] [facility] or office staff in the care of the patient . Actual time is 30 min)   SIGNATURE    Dr. Brand Males, M.D., F.C.C.P,  Pulmonary and Critical Care Medicine Staff Physician, Grill Director - Interstitial Lung Disease  Program  [facility] [place] [facility] [place], [place], [address]  Pager: [phone], If no answer or between  15:00h - 7:00h: call [phone] Telephone: [phone]  12:33 PM 10/17/2019     Patient Instructions by Brand Males, MD at 10/17/2019 11:45 AM Author: Brand Males, MD Author Type: Physician Filed: 10/17/2019 12:31 PM  Note Status: Addendum Mickle Mallory: Cosign Not Required Encounter Date: 10/17/2019  Editor: Brand Males, MD (Physician)      Prior Versions: 1. Brand Males, MD (Physician) at 10/17/2019 12:29 PM - Addendum  2. Brand Males, MD (Physician) at 10/17/2019 12:27 PM - Addendum   3. Brand Males, MD (Physician) at 10/17/2019 12:09 PM - Addendum   4. Brand Males, MD (Physician) at 10/17/2019 12:07 PM - Signed    Interstitial lung disease due to connective tissue disease ([place]) History of rheumatoid arthritis   - Pulmonary Fibrosis is every so slowly and steadiy progressive -You are in the ILD-pro registry study [I just confirmed]  Plan  -We discussed taking antifibrotic's  extensively but took a shared decision making to hold off -We have decided to follow you expectantly -Do spirometry and DLCO in 6 months  H/O agent Orange exposure  -This should be addressed with the [facility]  Follow-up -Return to see [name] in 6 months but after spirometry and DLCO             -We will do the ILD-pro registry visit at that time as well             -Do symptom score and simple walk test    Instructions  Interstitial lung disease due to connective tissue disease ([place]) History of rheumatoid arthritis   - Pulmonary Fibrosis is every so slowly and steadiy progressive -You are in the ILD-pro registry study [I just confirmed]  Plan  -We discussed taking antifibrotic's extensively but took a shared decision making to hold off -We have decided to follow you expectantly -Do spirometry and DLCO in 6 months  H/O agent Orange exposure  -This should be addressed with the [facility]  Follow-up -Return to see [name] in 6 months but after spirometry and DLCO             -We will do the ILD-pro registry visit at that time as well             -Do symptom score and simple walk test        After Visit Summary (Printed 10/17/2019) Communications    [facility] Provider CC Chart Rep sent to [name], MD Media From this encounter Electronic signature on 10/17/2019 11:32 AM - E-signed  Communication Routing History  Recipient Method Sent by Date Sent  [name], MD Fax Brand Males, MD 10/17/2019  Fax: [phone]  Phone: [phone]   No questionnaires available.         Was appointment offered to patient (explain)?     Reason for call: Congestion with cough started last week  [place] the beach. 0 fever,Clear mucus, no wheezing. Patient did use OTC medication to help with sore throat and ear pain (Sudafed and  tylenol) that is gone now.Patient has both COVID vaccine's. Patient is waiting for medication Esbriet   267MG sent to the house. Advised vicki to contact [facility] for patient's medication. Vicki's voice was understanding.    Dr.Ramaswamy can you please advise.  Thank you  Allergies  Allergen Reactions  . Celecoxib Rash    Skin rash     Immunization History  Administered Date(s) Administered  . Influenza, High Dose Seasonal PF 03/26/2017  . Influenza-Unspecified 05/03/2015, 02/13/2016, 03/26/2016, 03/19/2017, 03/03/2018, 03/17/2019  . PFIZER SARS-COV-2 Vaccination 07/04/2019, 07/18/2019  . Pneumococcal Polysaccharide-23 12/10/2011, 01/30/2018  . Pneumococcal-Unspecified 03/26/2016, 02/04/2018  . Tdap 01/17/2016, 02/04/2018

## 2020-02-08 ENCOUNTER — Ambulatory Visit (INDEPENDENT_AMBULATORY_CARE_PROVIDER_SITE_OTHER): Payer: Medicare Other | Admitting: *Deleted

## 2020-02-08 DIAGNOSIS — I442 Atrioventricular block, complete: Secondary | ICD-10-CM | POA: Diagnosis not present

## 2020-02-08 LAB — CUP PACEART REMOTE DEVICE CHECK
Battery Remaining Longevity: 6 mo
Battery Remaining Percentage: 5 %
Battery Voltage: 2.66 V
Brady Statistic AP VP Percent: 97 %
Brady Statistic AP VS Percent: 1 %
Brady Statistic AS VP Percent: 2.5 %
Brady Statistic AS VS Percent: 1 %
Brady Statistic RA Percent Paced: 97 %
Brady Statistic RV Percent Paced: 99 %
Date Time Interrogation Session: 20210915024137
Implantable Lead Implant Date: 20130716
Implantable Lead Implant Date: 20130716
Implantable Lead Location: 753859
Implantable Lead Location: 753860
Implantable Lead Model: 1948
Implantable Pulse Generator Implant Date: 20130716
Lead Channel Impedance Value: 310 Ohm
Lead Channel Impedance Value: 590 Ohm
Lead Channel Pacing Threshold Amplitude: 0.5 V
Lead Channel Pacing Threshold Amplitude: 0.5 V
Lead Channel Pacing Threshold Pulse Width: 0.5 ms
Lead Channel Pacing Threshold Pulse Width: 0.5 ms
Lead Channel Sensing Intrinsic Amplitude: 1.5 mV
Lead Channel Sensing Intrinsic Amplitude: 12 mV
Lead Channel Setting Pacing Amplitude: 0.75 V
Lead Channel Setting Pacing Amplitude: 1.5 V
Lead Channel Setting Pacing Pulse Width: 0.5 ms
Lead Channel Setting Sensing Sensitivity: 4 mV
Pulse Gen Model: 2210
Pulse Gen Serial Number: 7356500

## 2020-02-09 ENCOUNTER — Ambulatory Visit (INDEPENDENT_AMBULATORY_CARE_PROVIDER_SITE_OTHER): Payer: Medicare Other | Admitting: Cardiovascular Disease

## 2020-02-09 ENCOUNTER — Other Ambulatory Visit: Payer: Self-pay

## 2020-02-09 ENCOUNTER — Telehealth: Payer: Self-pay | Admitting: Internal Medicine

## 2020-02-09 ENCOUNTER — Encounter: Payer: Self-pay | Admitting: Cardiovascular Disease

## 2020-02-09 DIAGNOSIS — I10 Essential (primary) hypertension: Secondary | ICD-10-CM

## 2020-02-09 DIAGNOSIS — G4733 Obstructive sleep apnea (adult) (pediatric): Secondary | ICD-10-CM

## 2020-02-09 DIAGNOSIS — I480 Paroxysmal atrial fibrillation: Secondary | ICD-10-CM | POA: Diagnosis not present

## 2020-02-09 DIAGNOSIS — I2510 Atherosclerotic heart disease of native coronary artery without angina pectoris: Secondary | ICD-10-CM | POA: Diagnosis not present

## 2020-02-09 DIAGNOSIS — E785 Hyperlipidemia, unspecified: Secondary | ICD-10-CM

## 2020-02-09 DIAGNOSIS — E039 Hypothyroidism, unspecified: Secondary | ICD-10-CM

## 2020-02-09 DIAGNOSIS — Z950 Presence of cardiac pacemaker: Secondary | ICD-10-CM | POA: Diagnosis not present

## 2020-02-09 DIAGNOSIS — J849 Interstitial pulmonary disease, unspecified: Secondary | ICD-10-CM

## 2020-02-09 MED ORDER — LISINOPRIL 30 MG PO TABS: 30.0000 mg | ORAL_TABLET | Freq: Every day | ORAL | 3 refills | Status: DC

## 2020-02-09 NOTE — Telephone Encounter (Signed)
Cameron [name] calling back. Informed Cameron [name] that the lab order is in.  Cameron [name] stated that they will be in tomorrow 02/10/20 to get labwork done. Nothing further needed.

## 2020-02-09 NOTE — Patient Instructions (Signed)
Medication Instructions:  INCREASE YOUR LISINOPRIL TO 30MG DAILY  *If you need a refill on your cardiac medications before your next appointment, please call your pharmacy*    Follow-Up: At [facility], you and your health needs are our priority.  As part of our continuing mission to provide you with exceptional heart care, we have created designated Provider Care Teams.  These Care Teams include your primary Cardiologist (physician) and Advanced Practice Providers (APPs -  Physician Assistants and Nurse Practitioners) who all work together to provide you with the care you need, when you need it.  We recommend signing up for the patient portal called "MyChart".  Sign up information is provided on this After Visit Summary.  MyChart is used to connect with patients for Virtual Visits (Telemedicine).  Patients are able to view lab/test results, encounter notes, upcoming appointments, etc.  Non-urgent messages can be sent to your provider as well.   To learn more about what you can do with MyChart, go to [email or web address].    Your next appointment:   6 month(s)  The format for your next appointment:   In Person  Provider:   [name], MD

## 2020-02-09 NOTE — Telephone Encounter (Signed)
Labs are due now and already have been ordered  [facility] for [facility]

## 2020-02-10 NOTE — Progress Notes (Signed)
Remote pacemaker transmission.

## 2020-02-11 ENCOUNTER — Encounter: Payer: Self-pay | Admitting: Cardiovascular Disease
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
## 2020-02-28 ENCOUNTER — Telehealth: Payer: Self-pay | Admitting: Internal Medicine

## 2020-02-28 NOTE — Telephone Encounter (Signed)
I have never seen lip swelling side effect from pirfenidone/Esbriet.  However seen at multiple number of times happen with lisinopril.  Therefore I think this is the culprit  Plan -Stop lisinopril immediately   -If this continues to get worse to the point that he is choking or his tongue starts getting swollen up when he starts drooling saliva or he feels increasing shortness of breath he needs to go to the ER.  It should settle with then 24-72 hours   -If he starts getting hives and other symptoms also he should go to the ER  -Recommend he also stop fish oil because this contributes to acid reflux and there is no known benefit with coronary artery disease [cannot remember if I advised this to him before]  = He can call the practice number line at night.  Please give the number if he has any concerns  Allergies  Allergen Reactions  . Celecoxib Rash    Skin rash       Current Outpatient Medications:  .  acetaminophen (TYLENOL) 500 MG tablet, Take 500 mg by mouth as needed. , Disp: , Rfl:  .  aspirin 81 MG chewable tablet, Chew by mouth daily., Disp: , Rfl:  .  betamethasone dipropionate (DIPROLENE) 0.05 % ointment, Apply topically 2 (two) times daily as needed (Rash). Apply to Elbow, hip and outer buttock area twice daily for up to 3 weeks then weekends only, Disp: 45 g, Rfl: 2 .  BYSTOLIC 10 MG tablet, Take 10 mg by mouth daily. , Disp: , Rfl:  .  calcipotriene (DOVONOX) 0.005 % cream, Apply topically 2 (two) times daily. Apply to all affected areas all over, Disp: 60 g, Rfl: 11 .  clobetasol (OLUX) 0.05 % topical foam, Apply topically 2 (two) times daily., Disp: 50 g, Rfl: 0 .  clopidogrel (PLAVIX) 75 MG tablet, TAKE ONE TABLET BY MOUTH EVERY MORNING WITH BREAKFAST, Disp: 90 tablet, Rfl: 2 .  doxycycline (VIBRA-TABS) 100 MG tablet, Take 1 tablet (100 mg total) by mouth 2 (two) times daily., Disp: 10 tablet, Rfl: 0 .  furosemide (LASIX) 20 MG tablet, Take 1 tablet (20 mg total) by  mouth as needed (SWELLING IN LEG)., Disp: 30 tablet, Rfl: 1 .  gabapentin (NEURONTIN) 300 MG capsule, Take 300 mg by mouth at bedtime., Disp: , Rfl:  .  glimepiride (AMARYL) 2 MG tablet, Take 2 mg by mouth every morning., Disp: , Rfl:  .  hydrocortisone 2.5 % cream, Apply topically 2 (two) times daily as needed (Rash). Apply to buttock crease and inner thightfor up to 3 weeks then weekends only, Disp: 90 g, Rfl: 2 .  leflunomide (ARAVA) 10 MG tablet, Take 1 tablet by mouth daily., Disp: , Rfl:  .  lisinopril (ZESTRIL) 30 MG tablet, Take 1 tablet (30 mg total) by mouth daily., Disp: 90 tablet, Rfl: 3 .  metFORMIN (GLUCOPHAGE) 500 MG tablet, Take 500 mg by mouth 2 (two) times daily., Disp: , Rfl:  .  OMEGA-3-ACID ETHYL ESTERS PO, Take by mouth., Disp: , Rfl:  .  pantoprazole (PROTONIX) 40 MG tablet, Take 40 mg by mouth 2 (two) times daily., Disp: , Rfl:  .  Pirfenidone (ESBRIET) 267 MG TABS, Take 3 tablets (801 mg total) by mouth 3 (three) times daily with meals., Disp: 270 tablet, Rfl: 1 .  pravastatin (PRAVACHOL) 20 MG tablet, Take 1 tablet (20 mg total) by mouth every evening. *NEEDS OFFICE VISIT FOR FURTHER REFILLS*, Disp: 90 tablet, Rfl: 0 .  predniSONE (DELTASONE) 10 MG tablet, 40 mg daily x 2 days, then 43m daily x 2 days, then 157mdaily x 2 days, then 38m538maily x 2 days and stop, Disp: 15 tablet, Rfl: 0 .  tamsulosin (FLOMAX) 0.4 MG CAPS capsule, Take 1 capsule (0.4 mg total) by mouth daily., Disp: 90 capsule, Rfl: 3 .  thyroid (ARMOUR) 90 MG tablet, Take 90 mg by mouth every morning. , Disp: , Rfl:  .  trospium (SANCTURA) 20 MG tablet, Take 20 mg by mouth 2 (two) times daily., Disp: , Rfl:  .  TURMERIC PO, Take 1 capsule by mouth daily., Disp: , Rfl:  No current facility-administered medications for this visit.  Facility-Administered Medications Ordered in Other Visits:  .  ondansetron (ZOFRAN) 4 mg in sodium chloride 0.9 % 50 mL IVPB, 4 mg, Intravenous, Q6H PRN, CabAshok PallD

## 2020-02-28 NOTE — Telephone Encounter (Signed)
Called and spoke with wife who states that over the past week patients lower lip has been swollen, it has white patches on it and is sore. States that patient started Esbriet in June and is now taking the regular dose. Denies shortness of breath  [name] please advise

## 2020-02-29 NOTE — Telephone Encounter (Signed)
Called and spoke with pt's spouse Vickie letting her know the info stated by MR that they need to contact PCP to have BP med changed. Vickie verbalized understanding. Nothing further needed.

## 2020-02-29 NOTE — Telephone Encounter (Signed)
He should talk to primary care Sparks, Leonie Douglas, MD about altnerative BP drug. Defnitely no ACE inhibiors. Please list ACE inhibitor as allergy - tongue swelling, angioedema

## 2020-02-29 NOTE — Telephone Encounter (Signed)
Lm for patient.  

## 2020-02-29 NOTE — Telephone Encounter (Signed)
Called and spoke to pt. Informed him of the recs per MR. Pt verbalized understanding. He also states he increased his Lisinopril from 53m to 356mabout a month ago and does feel like this is the reason for the swelling. Pt would like to thank Dr. RaChase Caller  Pt questioning if he should be on another BP medication as his BP has been slightly elevated lately with a SBP of 150s on 10/5. Pt aware to call back on Monday to give update but to seek emergency care if any new or worsening s/s.   Dr. RaChase Callerplease advise.

## 2020-03-08 ENCOUNTER — Encounter: Payer: Self-pay | Admitting: Nurse Practitioner

## 2020-03-08 ENCOUNTER — Other Ambulatory Visit: Payer: Self-pay

## 2020-03-08 ENCOUNTER — Ambulatory Visit (INDEPENDENT_AMBULATORY_CARE_PROVIDER_SITE_OTHER): Payer: Medicare Other | Admitting: Nurse Practitioner

## 2020-03-08 VITALS — BP 140/68 | HR 64 | Ht 67.0 in | Wt 188.0 lb

## 2020-03-08 DIAGNOSIS — I10 Essential (primary) hypertension: Secondary | ICD-10-CM

## 2020-03-08 DIAGNOSIS — I442 Atrioventricular block, complete: Secondary | ICD-10-CM | POA: Diagnosis not present

## 2020-03-08 DIAGNOSIS — I2510 Atherosclerotic heart disease of native coronary artery without angina pectoris: Secondary | ICD-10-CM

## 2020-03-08 LAB — CUP PACEART INCLINIC DEVICE CHECK
Date Time Interrogation Session: 20211014115032
Implantable Lead Implant Date: 20130716
Implantable Lead Implant Date: 20130716
Implantable Lead Location: 753859
Implantable Lead Location: 753860
Implantable Lead Model: 1948
Implantable Pulse Generator Implant Date: 20130716
Pulse Gen Model: 2210
Pulse Gen Serial Number: 7356500
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
## 2020-03-08 NOTE — Patient Instructions (Signed)
Medication Instructions:  *If you need a refill on your cardiac medications before your next appointment, please call your pharmacy*  Follow-Up: At [facility], you and your health needs are our priority.  As part of our continuing mission to provide you with exceptional heart care, we have created designated Provider Care Teams.  These Care Teams include your primary Cardiologist (physician) and Advanced Practice Providers (APPs -  Physician Assistants and Nurse Practitioners) who all work together to provide you with the care you need, when you need it.  We recommend signing up for the patient portal called "MyChart".  Sign up information is provided on this After Visit Summary.  MyChart is used to connect with patients for Virtual Visits (Telemedicine).  Patients are able to view lab/test results, encounter notes, upcoming appointments, etc.  Non-urgent messages can be sent to your provider as well.   To learn more about what you can do with MyChart, go to [email or web address].    Your next appointment:   Your physician wants you to follow-up in: 1 YEAR with [name]. You will receive a reminder letter in the mail two months in advance. If you don't receive a letter, please call our office to schedule the follow-up appointment.  Remote monitoring is used to monitor your Pacemaker from home. This monitoring reduces the number of office visits required to check your device to one time per year. It allows [place] to keep an eye on the functioning of your device to ensure it is working properly. You are scheduled for a device check from home on 05/09/20. You may send your transmission at any time that day. If you have a wireless device, the transmission will be sent automatically. After your physician reviews your transmission, you will receive a postcard with your next transmission date.  The format for your next appointment:   In Person with [name], MD

## 2020-03-22 ENCOUNTER — Ambulatory Visit: Payer: Medicare Other | Admitting: Cardiovascular Disease

## 2020-03-29 ENCOUNTER — Other Ambulatory Visit: Payer: Self-pay

## 2020-03-29 ENCOUNTER — Ambulatory Visit (INDEPENDENT_AMBULATORY_CARE_PROVIDER_SITE_OTHER): Payer: Medicare Other | Admitting: Dermatology

## 2020-03-29 DIAGNOSIS — L570 Actinic keratosis: Secondary | ICD-10-CM

## 2020-03-29 DIAGNOSIS — L578 Other skin changes due to chronic exposure to nonionizing radiation: Secondary | ICD-10-CM

## 2020-03-29 DIAGNOSIS — D1800 Hemangioma unspecified site: Secondary | ICD-10-CM

## 2020-03-29 DIAGNOSIS — D239 Other benign neoplasm of skin, unspecified: Secondary | ICD-10-CM

## 2020-03-29 DIAGNOSIS — D2371 Other benign neoplasm of skin of right lower limb, including hip: Secondary | ICD-10-CM | POA: Diagnosis not present

## 2020-03-29 DIAGNOSIS — Z1283 Encounter for screening for malignant neoplasm of skin: Secondary | ICD-10-CM

## 2020-03-29 DIAGNOSIS — L219 Seborrheic dermatitis, unspecified: Secondary | ICD-10-CM

## 2020-03-29 DIAGNOSIS — L821 Other seborrheic keratosis: Secondary | ICD-10-CM

## 2020-03-29 DIAGNOSIS — L814 Other melanin hyperpigmentation: Secondary | ICD-10-CM

## 2020-03-29 DIAGNOSIS — L409 Psoriasis, unspecified: Secondary | ICD-10-CM | POA: Diagnosis not present

## 2020-03-29 DIAGNOSIS — D229 Melanocytic nevi, unspecified: Secondary | ICD-10-CM

## 2020-03-29 MED ORDER — KETOCONAZOLE 2 % EX CREA: TOPICAL_CREAM | CUTANEOUS | 2 refills | Status: DC

## 2020-03-29 NOTE — Progress Notes (Signed)
Follow-Up Visit   Subjective  Cameron [name] is a 79 y.o. male who presents for the following: FBSE.  Patient here for full body skin exam and skin cancer screening. He does have a history of AK's. Patient has noticed a spot on lower lip and one at right upper arm that he would like checked.  Patient being treating psoriasis at the right elbow and left hip with HC 2.5% and clobetasol foam. He advises that he has also been treating another spot behind his right ear.   Patient also treating seb derm at forehead, eyebrows and left ear with HC 2.5% and clobetasol foam as needed.   The following portions of the chart were reviewed this encounter and updated as appropriate:  Tobacco   Allergies   Meds   Problems   Med Hx   Surg Hx   Fam Hx       Review of Systems:  No other skin or systemic complaints except as noted in HPI or Assessment and Plan.  Objective  Well appearing patient in no apparent distress; mood and affect are within normal limits.  A full examination was performed including scalp, head, eyes, ears, nose, lips, neck, chest, axillae, abdomen, back, buttocks, bilateral upper extremities, bilateral lower extremities, hands, feet, fingers, toes, fingernails, and toenails. All findings within normal limits unless otherwise noted below.  Objective  Right Dorsal Hand: Erythematous thin papules/macules with gritty scale, hypertrophic  Objective  Right medial ankle: Firm pink/brown papulenodule with dimple sign.   Objective  Right Elbow, left hip, ears: Thin scaly pink plaque at left buttock  Objective  forehead, eyebrows, left ear: Mild scale at eyebrows   Assessment & Plan  AK (actinic keratosis) Right Dorsal Hand  Will plan LN2 in 1-2 months after the holidays  Discussed risks of blister formation, small wound, skin dyspigmentation, or rare scar following cryotherapy.    Dermatofibroma Right medial ankle  Benign-appearing.  Observation.  Call clinic for new  or changing lesions.  Recommend daily use of broad spectrum spf 30+ sunscreen to sun-exposed areas.    Psoriasis Right Elbow, left hip, ears  Chronic, well-controlled  No joint pain  Continue clobetasol foam twice daily for 1 week as needed for flares. Avoid applying to face, groin, and axilla. Use as directed. Risk of skin atrophy with long-term use reviewed.    clobetasol (OLUX) 0.05 % topical foam - Right Elbow, left hip, ears  Ordered Medications: ketoconazole (NIZORAL) 2 % cream  Seborrheic dermatitis forehead, eyebrows, left ear  Chronic, well-controlled  Start ketoconazole 2% cream twice daily for 1 week then as needed with flares. OK to use every day long-term. Use first before hydrocortisone and clobetasol to prevent atrophy.  Can continue with hydrocortisone 2.5% cream twice a day as needed to face up to one week and clobetasol foam twice a day as needed up to one-two weeks at ear as needed. Avoid applying clobetasol to face, groin, and axilla. Use as directed. Risk of skin atrophy with long-term use reviewed.     Lentigines - Scattered tan macules - Discussed due to sun exposure - Benign, observe - Call for any changes  Seborrheic Keratoses - Stuck-on, waxy, tan-brown papules and plaques  - Discussed benign etiology and prognosis. - Observe - Call for any changes  Melanocytic Nevi - Tan-brown and/or pink-flesh-colored symmetric macules and papules - Benign appearing on exam today - Observation - Call clinic for new or changing moles - Recommend daily use of broad spectrum  spf 30+ sunscreen to sun-exposed areas.   Hemangiomas - Red papules - Discussed benign nature - Observe - Call for any changes  Actinic Damage - Chronic, secondary to cumulative UV/sun exposure - diffuse scaly erythematous macules with underlying dyspigmentation - Recommend daily broad spectrum sunscreen SPF 30+ to sun-exposed areas, reapply every 2 hours as needed.  - Call for new  or changing lesions.  Skin cancer screening performed today.   Return 1-2 months, for treat AK's and recheck lip, 1 year TBSE.  Graciella Belton, RMA, am acting as scribe for Forest Gleason, MD .  Documentation: I have reviewed the above documentation for accuracy and completeness, and I agree with the above.  Forest Gleason, MD

## 2020-03-29 NOTE — Patient Instructions (Addendum)
Melanoma ABCDEs  Melanoma is the most dangerous type of skin cancer, and is the leading cause of death from skin disease.  You are more likely to develop melanoma if you:  Have light-colored skin, light-colored eyes, or red or blond hair  Spend a lot of time in the sun  Tan regularly, either outdoors or in a tanning bed  Have had blistering sunburns, especially during childhood  Have a close family member who has had a melanoma  Have atypical moles or large birthmarks  Early detection of melanoma is key since treatment is typically straightforward and cure rates are extremely high if we catch it early.   The first sign of melanoma is often a change in a mole or a new dark spot.  The ABCDE system is a way of remembering the signs of melanoma.  A for asymmetry:  The two halves do not match. B for border:  The edges of the growth are irregular. C for color:  A mixture of colors are present instead of an even brown color. D for diameter:  Melanomas are usually (but not always) greater than 39m - the size of a pencil eraser. E for evolution:  The spot keeps changing in size, shape, and color.  Please check your skin once per month between visits. You can use a small mirror in front and a large mirror behind you to keep an eye on the back side or your body.   If you see any new or changing lesions before your next follow-up, please call to schedule a visit.  Please continue daily skin protection including broad spectrum sunscreen SPF 30+ to sun-exposed areas, reapplying every 2 hours as needed when you're outdoors.   Continue clobetasol foam twice daily for 1 week as needed for flares. Avoid applying to face, groin, and axilla. Use as directed. Risk of skin atrophy with long-term use reviewed.   Start ketoconazole 2% cream for prevention twice daily for 1 week with flares.  Topical steroids (such as triamcinolone, fluocinolone, fluocinonide, mometasone, clobetasol, halobetasol,  betamethasone, hydrocortisone) can cause thinning and lightening of the skin if they are used for too long in the same area. Your physician has selected the right strength medicine for your problem and area affected on the body. Please use your medication only as directed by your physician to prevent side effects.

## 2020-04-10 ENCOUNTER — Ambulatory Visit: Payer: Medicare Other

## 2020-04-10 ENCOUNTER — Telehealth: Payer: Self-pay | Admitting: Internal Medicine

## 2020-04-10 NOTE — Telephone Encounter (Signed)
Following up with patient. No answer, LMOVM.  Patient has approx. 3.8 months until ERI as of 03/08/20.

## 2020-04-10 NOTE — Telephone Encounter (Signed)
New Message:     Wife says she thinks it is time for pt to get a new pacemake., If so, he is ready to get I. Please schedule asasp.

## 2020-04-12 NOTE — Telephone Encounter (Signed)
Spoke with pt wife, Loletha Carrow, advised device has almost 4 months until ERI.  Pt spouse reports he is going on vacation for 8 days in December, will be on a [place] and not always near cell reception.  He will take monitor with him, just in case rescheduled December check for date after pt returns.  Merlin schedule updated.

## 2020-04-14 ENCOUNTER — Other Ambulatory Visit (HOSPITAL_COMMUNITY)
Admission: RE | Admit: 2020-04-14 | Discharge: 2020-04-14 | Disposition: A | Discharge status: Home / Self Care | Payer: Medicare Other | Source: Ambulatory Visit | Attending: Internal Medicine | Admitting: Internal Medicine

## 2020-04-14 DIAGNOSIS — Z20822 Contact with and (suspected) exposure to covid-19: Secondary | ICD-10-CM | POA: Insufficient documentation

## 2020-04-14 DIAGNOSIS — Z01812 Encounter for preprocedural laboratory examination: Secondary | ICD-10-CM | POA: Diagnosis present

## 2020-04-14 LAB — SARS CORONAVIRUS 2 (TAT 6-24 HRS): SARS Coronavirus 2: NEGATIVE

## 2020-04-16 ENCOUNTER — Encounter: Payer: Self-pay | Admitting: Dermatology

## 2020-04-17 ENCOUNTER — Ambulatory Visit (INDEPENDENT_AMBULATORY_CARE_PROVIDER_SITE_OTHER): Payer: Medicare Other | Admitting: Internal Medicine

## 2020-04-17 ENCOUNTER — Encounter: Payer: Medicare Other | Admitting: *Deleted

## 2020-04-17 ENCOUNTER — Other Ambulatory Visit: Payer: Self-pay

## 2020-04-17 DIAGNOSIS — M359 Systemic involvement of connective tissue, unspecified: Secondary | ICD-10-CM | POA: Diagnosis not present

## 2020-04-17 DIAGNOSIS — J8489 Other specified interstitial pulmonary diseases: Secondary | ICD-10-CM

## 2020-04-17 DIAGNOSIS — J849 Interstitial pulmonary disease, unspecified: Secondary | ICD-10-CM

## 2020-04-17 LAB — PULMONARY FUNCTION TEST
DL/VA % pred: 105 %
DL/VA: 4.14 ml/min/mmHg/L
DLCO cor % pred: 87 %
DLCO cor: 19.38 ml/min/mmHg
DLCO unc % pred: 87 %
DLCO unc: 19.38 ml/min/mmHg
FEF 25-75 Pre: 2.47 L/sec
FEF2575-%Pred-Pre: 145 %
FEV1-%Pred-Pre: 98 %
FEV1-Pre: 2.46 L
FEV1FVC-%Pred-Pre: 112 %
FEV6-%Pred-Pre: 93 %
FEV6-Pre: 3.05 L
FEV6FVC-%Pred-Pre: 107 %
FVC-%Pred-Pre: 86 %
FVC-Pre: 3.05 L
Pre FEV1/FVC ratio: 81 %
Pre FEV6/FVC Ratio: 100 %

## 2020-04-17 NOTE — Progress Notes (Signed)
Spirometry and Dlco done today. 

## 2020-04-17 NOTE — Research (Signed)
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 The consent for this encounter is under Protocol Version Amendment 4 ([id number])andiscurrently IRB approved. Subject expressed continued interest and consent in continuing as a study subject. Subject confirmed that there were nochangesin contact information (e.g. address, telephone, email). Subject thanked for participation in research and contribution to science. Refer to the subject's paper source binder for additional information on this visit.  During this visiton11/23/2021, the subject completed the blood work and questionnaires per the above referenced protocol. Please refer to the subject's paper source binder for further details  Signed by  [place], [place] 10:52 AM11/23/2021

## 2020-04-23 NOTE — Telephone Encounter (Signed)
Advised pt spouse that we cannot schedule replacement procedure until battery reaches ERI.  AS of November check, battery has 4 months until ERI.  Device will give vibratory alert when it reaches ERI.

## 2020-04-27 ENCOUNTER — Other Ambulatory Visit: Payer: Self-pay | Admitting: Internal Medicine

## 2020-04-27 DIAGNOSIS — J8489 Other specified interstitial pulmonary diseases: Secondary | ICD-10-CM

## 2020-05-02 ENCOUNTER — Telehealth: Payer: Self-pay | Admitting: Internal Medicine

## 2020-05-02 NOTE — Telephone Encounter (Signed)
05/02/20   Spoke with patient and patient spouse.  They are currently out of Esbriet.  This medication was refilled on 04/30/2020 by [name].  They are reporting that [facility] or should be sending this medication out and should arrive on 05/08/2020.  Unfortunately they will be out of town at that point in time.  They are wondering if they can get this medication from a local pharmacy.  Plan: Patient to contact [facility] to see if they can send a partial dosage or expedite the order given the fact that they are out of town on the delivery date and are currently out of the medication  If unable to obtain medication sooner then patient will remain off of the antifibrotic's until returning back from vacation in about a week.  So to be off of Esbriet for a total of 2 weeks, then resume at full dosing  Got patient scheduled for follow-up with [name] on 06/29/2020 and a 30-minute time slot as patient has ILD  Reviewed pathophysiology of CT ILD and use of antifibrotic's  At next office visit would be beneficial to review with patient that they could receive Esbriet in 1 tablet form to take 3 times daily as he is at full dosing.  Nothing further needed at this point in time.  Wyn Quaker FNP

## 2020-05-15 ENCOUNTER — Telehealth: Payer: Self-pay | Admitting: Internal Medicine

## 2020-05-15 ENCOUNTER — Ambulatory Visit (INDEPENDENT_AMBULATORY_CARE_PROVIDER_SITE_OTHER): Payer: Medicare Other

## 2020-05-15 DIAGNOSIS — I442 Atrioventricular block, complete: Secondary | ICD-10-CM

## 2020-05-15 NOTE — Telephone Encounter (Signed)
Rx for pt's Esbriet was sent to [facility] on 04/30/20. Pt should be able to contact [facility] to request a refill of Rx.  Attempted to call pt's wife Loletha Carrow but unable to reach. Left message for her to return call.

## 2020-05-16 LAB — CUP PACEART REMOTE DEVICE CHECK
Battery Remaining Longevity: 1 mo
Battery Remaining Percentage: 0.5 %
Battery Voltage: 2.62 V
Brady Statistic AP VP Percent: 97 %
Brady Statistic AP VS Percent: 1 %
Brady Statistic AS VP Percent: 2.8 %
Brady Statistic AS VS Percent: 1 %
Brady Statistic RA Percent Paced: 97 %
Brady Statistic RV Percent Paced: 99 %
Date Time Interrogation Session: 20211222001627
Implantable Lead Implant Date: 20130716
Implantable Lead Implant Date: 20130716
Implantable Lead Location: 753859
Implantable Lead Location: 753860
Implantable Lead Model: 1948
Implantable Pulse Generator Implant Date: 20130716
Lead Channel Impedance Value: 300 Ohm
Lead Channel Impedance Value: 550 Ohm
Lead Channel Pacing Threshold Amplitude: 0.5 V
Lead Channel Pacing Threshold Amplitude: 0.625 V
Lead Channel Pacing Threshold Pulse Width: 0.5 ms
Lead Channel Pacing Threshold Pulse Width: 0.5 ms
Lead Channel Sensing Intrinsic Amplitude: 1.8 mV
Lead Channel Sensing Intrinsic Amplitude: 12 mV
Lead Channel Setting Pacing Amplitude: 0.875
Lead Channel Setting Pacing Amplitude: 1.5 V
Lead Channel Setting Pacing Pulse Width: 0.5 ms
Lead Channel Setting Sensing Sensitivity: 4 mV
Pulse Gen Model: 2210
Pulse Gen Serial Number: 7356500

## 2020-05-16 NOTE — Telephone Encounter (Signed)
Called and spoke to pt's wife, Vickie. She states they received the Esbriet refill in the mail yesterday and nothing further is needed. Will close encounter.

## 2020-05-29 ENCOUNTER — Ambulatory Visit: Payer: Medicare Other | Admitting: Cardiovascular Disease

## 2020-05-29 NOTE — Progress Notes (Signed)
Remote pacemaker transmission.   

## 2020-06-07 ENCOUNTER — Ambulatory Visit: Payer: Medicare Other | Admitting: Dermatology

## 2020-06-15 ENCOUNTER — Ambulatory Visit (INDEPENDENT_AMBULATORY_CARE_PROVIDER_SITE_OTHER): Payer: Medicare Other

## 2020-06-15 DIAGNOSIS — I442 Atrioventricular block, complete: Secondary | ICD-10-CM

## 2020-06-15 LAB — CUP PACEART REMOTE DEVICE CHECK
Battery Remaining Longevity: 1 mo
Battery Remaining Percentage: 0.5 %
Battery Voltage: 2.59 V
Brady Statistic AP VP Percent: 97 %
Brady Statistic AP VS Percent: 1 %
Brady Statistic AS VP Percent: 2.7 %
Brady Statistic AS VS Percent: 1 %
Brady Statistic RA Percent Paced: 97 %
Brady Statistic RV Percent Paced: 99 %
Date Time Interrogation Session: 20220121022711
Implantable Lead Implant Date: 20130716
Implantable Lead Implant Date: 20130716
Implantable Lead Location: 753859
Implantable Lead Location: 753860
Implantable Lead Model: 1948
Implantable Pulse Generator Implant Date: 20130716
Lead Channel Impedance Value: 330 Ohm
Lead Channel Impedance Value: 680 Ohm
Lead Channel Pacing Threshold Amplitude: 0.5 V
Lead Channel Pacing Threshold Amplitude: 0.5 V
Lead Channel Pacing Threshold Pulse Width: 0.5 ms
Lead Channel Pacing Threshold Pulse Width: 0.5 ms
Lead Channel Sensing Intrinsic Amplitude: 12 mV
Lead Channel Sensing Intrinsic Amplitude: 2 mV
Lead Channel Setting Pacing Amplitude: 0.75 V
Lead Channel Setting Pacing Amplitude: 1.5 V
Lead Channel Setting Pacing Pulse Width: 0.5 ms
Lead Channel Setting Sensing Sensitivity: 4 mV
Pulse Gen Model: 2210
Pulse Gen Serial Number: 7356500

## 2020-06-27 NOTE — Progress Notes (Signed)
Remote pacemaker transmission.   

## 2020-06-29 ENCOUNTER — Other Ambulatory Visit (INDEPENDENT_AMBULATORY_CARE_PROVIDER_SITE_OTHER): Payer: Medicare Other

## 2020-06-29 ENCOUNTER — Ambulatory Visit (INDEPENDENT_AMBULATORY_CARE_PROVIDER_SITE_OTHER): Payer: Medicare Other | Admitting: Internal Medicine

## 2020-06-29 ENCOUNTER — Encounter: Payer: Self-pay | Admitting: Internal Medicine

## 2020-06-29 ENCOUNTER — Other Ambulatory Visit: Payer: Self-pay

## 2020-06-29 VITALS — BP 126/50 | HR 74 | Temp 97.0°F | Ht 67.0 in | Wt 191.0 lb

## 2020-06-29 DIAGNOSIS — Z79899 Other long term (current) drug therapy: Secondary | ICD-10-CM | POA: Diagnosis not present

## 2020-06-29 LAB — HEPATIC FUNCTION PANEL
ALT: 14 U/L (ref 0–53)
AST: 17 U/L (ref 0–37)
Albumin: 4.3 g/dL (ref 3.5–5.2)
Alkaline Phosphatase: 67 U/L (ref 39–117)
Bilirubin, Direct: 0.1 mg/dL (ref 0.0–0.3)
Total Bilirubin: 0.4 mg/dL (ref 0.2–1.2)
Total Protein: 7.4 g/dL (ref 6.0–8.3)

## 2020-06-29 LAB — SARS-COV-2 IGG: SARS-COV-2 IgG: 6.88

## 2020-06-29 NOTE — Addendum Note (Signed)
Addended by: Vanessa Barbara on: 06/29/2020 03:24 PM   Modules accepted: Orders

## 2020-06-29 NOTE — Patient Instructions (Addendum)
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 but stable since last visit May 2021 -There is some new research to suggest agent orange exposure is independently a risk factor for pulmonary fibrosis  -the government has not accepted this fact as ye  - You are in the ILD-pro registry study [I just confirmed]  - last visit Nov 2021  Plan -Do blood IgG for Covid -if there is no antibody response then will refer you to monoclonal antibody prophylaxis -Do liver function test today and again in 3 months  --Do spirometry and DLCO in 6 months  -Continue Esbriet per protocol [eat with food, space adequately between meals and apply sunscreen at all times]  -Continue daily exercise as before  -Email [employer] [email or web address] .[email or web address] -who is a local support group leader and join the support group  Follow-up -Research team will contact you about the ILD-pro registry visit -Return to see [name] in a 30-minute slot in 6 months but after breathing test -Return sooner if needed

## 2020-06-29 NOTE — Progress Notes (Signed)
PCP [name], MD  HPI   IOV 07/10/2017  Chief Complaint  Patient presents with  . Advice Only    Referred by [facility] due to SOB.  PFT done 05/28/17.  Pt has been having issues with SOB x4 months especially with exertion and has some mild chest tightness. Denies any cough.    80 year old male referred by [name] for evaluation of shortness of breath after cardiac etiologies ruled out.  He tells me that he is a remote smoker.  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  He has a long-standing history of rheumatoid arthritis followed by Dr. Jefm Bryant in [place].  He is to be on methotrexate for many years and stopped taking it because of cardiac dysfunction [he personally is convinced that methotrexate because this].  He was then on leflunomide as of 2017 but is currently not on it.  His last rheumatoid factor and CCP antibodies were negative on my personal chart review of the outside records in 2016.    Now for the last 3 or 6 months he is got insidious onset of shortness of breath that is slowly progressive.  His dyspnea on exertion relieved by rest.  Class II-3 activities.  There I  s no associated cough or orthopnea proximal nocturnal dyspnea.  He did have some edema but this got cleared up but the dyspnea is continuing to get worse.  In the last few months he has had a cardiac echo that showed pulmonary hypertension.  Did have cardiac stress test that is normal.  Had pulmonary function test that showed isolated reduction in diffusion capacity and therefore he has been referred here.  Walking desaturation test on 07/10/2017 185 feet x 3 laps on ROOM AIR:  did NOT desaturate. Rest pulse ox was 100%, final pulse ox was 98%. HR response 60/min at rest to 121/min at peak exertion. Patient Cameron [name]  Did not Desaturate < 88% . Cameron [name] did not  Desaturated </=  3% points. Gerda Diss Milanese yes did get tachyardic   OV 08/04/2017  Chief Complaint  Patient presents with  . Follow-up    ILD    Follow-up interstitial lung disease workup  After the last visit no interim problems.  He has some chronic pedal edema that he will talk to about with his primary care physician.  He did see Dr. Jefm Bryant rheumatologist in July 15, 2017.  I reviewed his notes.  It is not specifically indicate patient has nonspecific seronegative arthritis with a differential diagnosis of seronegative rheumatoid arthritis versus psoriatic.  Patient still seems to think the root of all his problems is the methotrexate he took for over 10 years.  At this point in time there is no decompensation.  As part of the ILD workup his pulmonary function test shows mild reduction in diffusion capacity.  Correlating with this is evidence of pulmonary hypertension on the echo and high-resolution CT chest enlarged pulmonary arteries.  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  His autoimmune panel is negative.  And so the vasculitis panel and hypersensitivity pneumonitis panel.    IMPRESSION: 1. Mild subpleural reticular densities in the posterolateral aspects of both lower lobes are suspicious for mild fibrotic interstitial lung disease such as nonspecific interstitial  pneumonitis. Early/mild usual interstitial pneumonitis is not excluded. 2. Aortic atherosclerosis (ICD10-170.0). Coronary artery calcification. 3. Enlarged pulmonary arteries, indicative of pulmonary arterial Hypertension.- > in echo  Nov 2018: Pulmonary arteries: Systolic pressure was moderately increased.   PA peak pressure: 55 mm Hg (S). 4. Left renal stone, partially imaged.   Electronically Signed   By: Lorin Picket M.D.   On: 07/22/2017 15:03  OV 11/03/2017  Chief Complaint  Patient presents with  . Follow-up    Pt has SOB with  exertion, some dry cough.     Follow-up suspected interstitial lung disease in the setting of rheumatoid arthritis and long methotrexate intake  He presents with his wife.  At the time of last visit I was not fully convinced that he had interstitial lung disease.  His CT scan indicated presence of pulmonary hypertension and so did his echocardiogram.  Therefore I referred him back to [name] cardiology.  In the spring 2019 he did have a right heart catheterization and left heart catheterization that showed mild pulmonary hypertension but also coronary artery blockage.  He status post 2 stents.  After that his shortness of breath improved but he tells me overall his fatigue level has not improved.  In talking to him I find out that he exercises 5 times a week doing heart track 2 times a week and the other 3 days walking a mile each time and doing weight lifting.  It appears that he takes a 20-minute nap after these exercises and then feels reenergized.  His mother feels that he does not have the effort tolerance as his younger days but he denies having any symptoms of chest pain or shortness of breath or cough when he does these heavy exertion the weight lifting or walking a mile out doing heart track.  In fact in the walking desaturation test today he walked extremely fast and had no problems.  In terms of his possible interstitial lung disease he had pulmonary function test today and felt to show some improvement and on exam he does not have any crackles.  Also his wife tells me that for the last 2 months he is using CPAP for sleep apnea and this is also helping him.  Right Heart Pressures RHC Procedural Findings: Hemodynamics (mmHg) RA mean 2 RV 42/6 PA 42/8, mean 21 PCWP mean 7 LV 134/8 AO 147/52       OV 06/01/2019  Subjective:  [name], male , DOB: 1940/09/17 , age 80 y.o. , MRN: [id number] , ADDRESS: [phone] [place] [address]   06/01/2019 -   Chief  Complaint  Patient presents with  . Follow-up    Pt states he has been doing okay since last visit but states he has been having a little more SOB x4 weeks now. Pt also has occ coughing with yellow phlegm.   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 -returns for follow-up.  I personally have not seen him since the summer 2019.  He says overall he has been stable.  In August 2020 he had a CT scan of the chest that confirmed the presence of interstitial lung disease in the setting of his rheumatoid arthritis.  He was stable.  His pulmonary function test was stable.  He says now in the last 2 months has had a decline in shortness of breath.  There is also some  cough with sputum production but that has resolved.  It is definitely present with exertion but relieved by rest.  His walking desaturation test compared to 18 months ago is roughly the same except that he is very tachycardic.  He does have a pacemaker.  He has an appoint with [name] his electrophysiologist today.  His symptom scores are listed below.  SYMPTOM SCALE - ILD 06/01/2019   O2 use no  Shortness of Breath 0 -> 5 scale with 5 being worst (score 6 If unable to do)  At rest 0  Simple tasks - showers, clothes change, eating, shaving 0  Household (dishes, doing bed, laundry) 0  Shopping 0  Walking level at own pace 2  Walking keeping up with others of same age 22  Walking up Stairs 3  Walking up [place] 3  Total (40 - 48) Dyspnea Score 10  How bad is your cough? 1  How bad is your fatigue 2       ROS - per HPI     OV 08/01/2019 - telephine visit - identified with 2 person identifier. Telephone visit - limits, risks benefits explained  Subjective:  [name], male , DOB: [birth year] , age 40 y.o. , MRN: [id number] , ADDRESS: [place] [address]   08/01/2019 -  Follow-up  interstitial lung disease in the  setting of rheumatoid arthritis and long methotrexate intake   HPI Cameron [name] 80 y.o. - similar dyspnea compared to JAn 2021.  No better nor worse.  After last visit he has seen cardiology x2.  It appears the final conclusion is that diastolic dysfunction might be contributing to his shortness of breath.  But overall not major changes in his cardiac care.  He tells me that overall he is stable.  He had spirometry and DLCO the DLCO itself appears to be reduced compared to the recent 1 but stable compared to older ones.  The FVC suggest decline.  Patient himself feels stable.  Overall some mixed picture.  Last high-resolution CT chest was October 2020.    IMPRESSION: 1. 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 There is no significant air trapping on expiratory phase imaging. Findings are not significantly changed compared to prior examinations and remain in an "indeterminate for UIP" pattern by ATS pulmonary fibrosis criteria, differential considerations including both UIP and NSIP.  2.  Coronary artery disease and aortic atherosclerosis.  3.  Left nephrolithiasis.   Electronically Signed   By: Eddie Candle M.D.   On: 01/12/2019 15:08    OV 10/17/2019  Subjective:  [name], male , DOB: April 25, 1941 , age 15 y.o. , MRN: [id number] , ADDRESS: [phone] [place] [address]   10/17/2019 -   Chief Complaint  Patient presents with  . Follow-up    pt states sobwhen doing activities.   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 -presents for follow-up after having his spirometry and DLCO and high-resolution CT chest.  Overall  he feels stable compared to the last visit but he says definitely compared to 2 years ago his symptoms are worse.  Compared to 1 year ago his symptoms are the same.  He is  kind of leery of taking new medications.  His high-resolution CT scan of the chest indicates mild progression since 2019 February.  His pulmonary function tests also indicate progression compared to 2 years ago but fluctuant in more recent times.  He is reporting agent orange exposure and is wondering if his ILD could be related to that.  He reminded me that he is already on the ILD-pro registry study.  His next scheduled visit is in October 2021.   Results for KAYLEE, WOMBLES" (MRN [id number]) as of 10/17/2019 12:07  Ref. Range 05/28/2017 13:28 11/03/2017 14:10 01/17/2019 12:39 07/21/2019 15:53 10/13/2019 15:53  FVC-Pre Latest Units: L 3.29 3.59 3.41 2.92 3.08  FVC-%Pred-Pre Latest Units: % 90 98 94 81 86  Results for RAFI, KENNETH A "[name]" (MRN [id number]) as of 10/17/2019 12:07  Ref. Range 05/28/2017 13:28 11/03/2017 14:10 01/17/2019 12:39 07/21/2019 15:53 10/13/2019 15:53  DLCO unc Latest Units: ml/min/mmHg 20.35 20.97 21.58 20.08 19.58  DLCO unc % pred Latest Units: % 71 74 96 89 87     High-resolution CT chest May 2021 Lungs/Pleura: Peripheral and basilar predominant subpleural interlobular and intra lobular septal thickening and ground-glass. Findings persist on prone imaging and appear similar to 01/12/2019 but may be minimally progressive from 07/22/2017. 4 mm peripheral left lower lobe nodule (14/101), stable from 07/22/2017 and considered benign. Lungs are otherwise clear. No pleural fluid. Airway is unremarkable. Mild air trapping.  Upper Abdomen: Visualized portions of the liver and adrenal glands are unremarkable. Stones are seen in the kidneys bilaterally. Spleen and visualized portions of the pancreas, stomach and bowel are grossly unremarkable. Cholecystectomy. No upper  abdominal adenopathy.  Musculoskeletal: Degenerative changes in the spine. No worrisome lytic or sclerotic lesions.  IMPRESSION: 1. Pulmonary parenchymal pattern of fibrosis appears grossly stable from 01/12/2019 but may be minimally progressive from 07/22/2017. 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 Usual interstitial pneumonitis is certainly not excluded. Findings are indeterminate for UIP per consensus guidelines: Diagnosis of Idiopathic Pulmonary Fibrosis: An Official ATS/ERS/JRS/ALAT Clinical Practice Guideline. [place], Iss 5, [id number], Jan 24 2017. 2. Bilateral renal stones. 3. Aortic atherosclerosis (ICD10-I70.0). Coronary artery calcification. 4. Enlarged pulmonic trunk, indicative of pulmonary arterial hypertension.   Electronically Signed   By: Lorin Picket M.D.   On: 10/10/2019 14:00  ROS - per HPI     OV 06/29/2020  Subjective:  [name], male , DOB: December 01, 1940 , age 35 y.o. , MRN: [id number] , [address]ESS: [place] [address] [place] [address] PCP [name], MD Patient Care Team: [name], MD as PCP - General (Unknown Physician Specialty) [name], MD as PCP - Electrophysiology (Cardiology) Troy Sine, MD as PCP - Cardiology (Cardiology) Sueanne Margarita, MD as PCP - Sleep Medicine (Cardiology)  This Provider for this visit: Treatment Team:  Attending Provider: Brand Males, MD    06/29/2020 -   Chief Complaint  Patient presents with  . Follow-up    SOB unchanged, slight nonproductive cough. Esbriet doing well.   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 Normal echo Jan 2021   HPI Cameron [name] 80 y.o.  -returns for follow-up.  He presents with his wife.  Last seen in  May 2021.  After that in June 2020 when he started pirfenidone for progressive ILD.  He tells me that he has been tolerating pirfenidone just fine.  Of note he has not had any liver function test since he started pirfenidone.  He is not having any GI side effects of skin side effects from the pirfenidone.  In the last 6 months his ILD stable according to his history.  His walking desaturation test and symptom scores are stable.  He is up-to-date with his Covid vaccine.  He is on leflunomide.  He is on the ILD-pro registry study with a last visit was in November 2021.  He needs a visit every 6 months.  He is willing to get a Covid IgG checked because he is immunosuppressed.  This is in response to humoral immunity to vaccine     SYMPTOM SCALE - ILD 10/17/2019  06/29/2020   O2 use ra ra  Shortness of Breath 0 -> 5 scale with 5 being worst (score 6 If unable to do)   At rest 0 0  Simple tasks - showers, clothes change, eating, shaving 1 1  Household (dishes, doing bed, laundry) 1 0  Shopping 0 0  Walking level at own pace 2 0  Walking up Stairs 3 3  Total (30-36) Dyspnea Score 7 4  How bad is your cough? x 1  How bad is your fatigue 4 2  How bad is nausea 0 0  How bad is vomiting?  0 0  How bad is diarrhea? 1 0  How bad is anxiety? 1 1  How bad is depression 1 1         Simple office walk 185 feet x  3 laps goal with forehead probe 11/03/2017  06/01/2019  06/29/2020   O2 used Room air Room air   Number laps completed 3 3   Comments about pace Fast very  99% and 66   Resting Pulse Ox/HR 100% and 70/min 99% and 66/min 97% and 71  Final Pulse Ox/HR 98% and 121/min 98% and 120 min 98% and 121/min  Desaturated </= 88% no    Desaturated <= 3% points no    Got Tachycardic >/= 90/min yes    Symptoms at end of test none Mild dyspea No dyspnea  Miscellaneous comments veryu fast Mod pace fast   PFT  PFT Results Latest Ref Rng  & Units 04/17/2020 10/13/2019 07/21/2019 01/17/2019 11/03/2017 05/28/2017  FVC-Pre L 3.05 3.08 2.92 3.41 3.59 3.29  FVC-Predicted Pre % 86 86 81 94 98 90  FVC-Post L - - - - - 3.28  FVC-Predicted Post % - - - - - 89  Pre FEV1/FVC % % 81 81 82 82 83 87  Post FEV1/FCV % % - - - - - 83  FEV1-Pre L 2.46 2.49 2.40 2.78 2.98 2.85  FEV1-Predicted Pre % 98 98 94 107 114 109  FEV1-Post L - - - - - 2.72  DLCO uncorrected ml/min/mmHg 19.38 19.58 20.08 21.58 20.97 20.35  DLCO UNC% % 87 87 89 96 74 71  DLCO corrected ml/min/mmHg 19.38 19.58 20.08 - - -  DLCO COR %Predicted % 87 87 89 - - -  DLVA Predicted % 105 101 112 104 89 92  TLC L - - - - - 5.47  TLC % Predicted % - - - - - 84  RV % Predicted % - - - - - 80       has a past medical history  of Appendicitis, Atrial fibrillation ([place]) (12/12/2013), BPH (benign prostatic hypertrophy), CAD (coronary artery disease) (12/2015), Chest pain (12/03/2015), CHF (congestive heart failure) ([place]), Complete heart block ([place]), Coronary artery disease, Coronary artery disease involving native coronary artery of native heart with unstable angina pectoris ([place]) (08/11/2017), History of blood transfusion (1968), History of kidney stones, Hyperglycemia (11/05/2013), Hyperlipidemia (11/05/2013), Hypertension, Hypothyroidism, Hypothyroidism, unspecified (11/05/2013), 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   reports that he quit smoking about 48 years ago. His smoking use included cigarettes. He has a 5.00 pack-year smoking history. He quit smokeless tobacco use about 47 years ago.  His smokeless tobacco use included chew.  Past Surgical History:  Procedure Laterality Date  . BACK SURGERY    . CARDIAC CATHETERIZATION N/A 12/28/2015   Procedure: Left Heart  Cath and Coronary Angiography;  Surgeon: Belva Crome, MD;  Location: [facility];  Service: Cardiovascular;  Laterality: N/A;  . CATARACT EXTRACTION W/ INTRAOCULAR LENS  IMPLANT, BILATERAL Bilateral   . CORONARY ANGIOPLASTY WITH STENT PLACEMENT  08/11/2017   "2 stents"  . CORONARY STENT INTERVENTION N/A 08/11/2017   Procedure: CORONARY STENT INTERVENTION;  Surgeon: Troy Sine, MD;  Location: [facility];  Service: Cardiovascular;  Laterality: N/A;  . CYSTOSCOPY W/ STONE MANIPULATION    . INGUINAL HERNIA REPAIR Left   . INSERT / REPLACE / REMOVE PACEMAKER  12/09/2011   SJM Accent DR RF implanted by DR Allred for complete heart block and syncope  . JOINT REPLACEMENT    . LAPAROSCOPIC CHOLECYSTECTOMY    . LITHOTRIPSY    . LUMBAR DISC SURGERY     "removed arthritis and spurs"  . PERMANENT PACEMAKER INSERTION N/A 12/09/2011   Procedure: PERMANENT PACEMAKER INSERTION;  Surgeon: [name], MD;  Location: [facility];  Service: Cardiovascular;  Laterality: N/A;  . REPLACEMENT TOTAL KNEE Right   . RIGHT/LEFT HEART CATH AND CORONARY ANGIOGRAPHY N/A 08/11/2017   Procedure: RIGHT/LEFT HEART CATH AND CORONARY ANGIOGRAPHY;  Surgeon: Larey Dresser, MD;  Location: [facility];  Service: Cardiovascular;  Laterality: N/A;  . TRANSURETHRAL RESECTION OF PROSTATE  2017/2018    Allergies  Allergen Reactions  . Ace Inhibitors Swelling    Tongue swelling, angioedema  . Celecoxib Rash    Skin rash     Immunization History  Administered Date(s) Administered  . Fluad Quad(high Dose 65+) 02/11/2020  . Influenza, High Dose Seasonal PF 03/26/2017  . Influenza-Unspecified 05/03/2015, 02/13/2016, 03/26/2016, 03/19/2017, 03/03/2018, 03/17/2019  . PFIZER(Purple Top)SARS-COV-2 Vaccination 06/11/2019, 07/04/2019, 07/18/2019, 03/01/2020  . Pneumococcal Polysaccharide-23 12/10/2011, 01/30/2018  . Pneumococcal-Unspecified 03/26/2016, 02/04/2018  . Tdap 01/17/2016, 02/04/2018     Family History  Problem Relation Age of Onset  . Pancreatic cancer Mother   . Bone cancer Father   . Alzheimer's disease Sister   . Heart attack Paternal Uncle   . Arthritis Maternal Grandfather   . Alzheimer's disease Paternal Grandmother   . Lung cancer Paternal Grandfather      Current Outpatient Medications:  .  acetaminophen (TYLENOL) 500 MG tablet, Take 500 mg by mouth as needed. , Disp: , Rfl:  .  aspirin 81 MG chewable tablet, Chew by mouth daily., Disp: , Rfl:  .  BYSTOLIC 10 MG tablet, Take 10 mg by mouth daily. , Disp: , Rfl:  .  clobetasol (OLUX) 0.05 % topical foam, Apply  topically 2 (two) times daily., Disp: 50 g, Rfl: 0 .  clopidogrel (PLAVIX) 75 MG tablet, TAKE ONE TABLET BY MOUTH EVERY MORNING WITH BREAKFAST, Disp: 90 tablet, Rfl: 2 .  ESBRIET 267 MG TABS, TAKE 3 TABLETS THREE TIMES A DAY WITH MEALS (MAINTENANCE DOSE, START AFTER COMPLETED STARTER DOSE), Disp: 270 tablet, Rfl: 11 .  gabapentin (NEURONTIN) 300 MG capsule, Take 300 mg by mouth at bedtime., Disp: , Rfl:  .  glimepiride (AMARYL) 2 MG tablet, Take 2 mg by mouth every morning., Disp: , Rfl:  .  ketoconazole (NIZORAL) 2 % cream, Apply twice daily for 1 week as needed, Disp: 60 g, Rfl: 2 .  leflunomide (ARAVA) 10 MG tablet, Take 1 tablet by mouth daily., Disp: , Rfl:  .  metFORMIN (GLUCOPHAGE) 500 MG tablet, Take 500 mg by mouth 2 (two) times daily., Disp: , Rfl:  .  pantoprazole (PROTONIX) 40 MG tablet, Take 40 mg by mouth 2 (two) times daily., Disp: , Rfl:  .  pravastatin (PRAVACHOL) 20 MG tablet, Take 1 tablet (20 mg total) by mouth every evening. *NEEDS OFFICE VISIT FOR FURTHER REFILLS*, Disp: 90 tablet, Rfl: 0 .  tamsulosin (FLOMAX) 0.4 MG CAPS capsule, Take 1 capsule (0.4 mg total) by mouth daily., Disp: 90 capsule, Rfl: 3 .  telmisartan (MICARDIS) 80 MG tablet, Take 80 mg by mouth daily., Disp: , Rfl:  .  thyroid (ARMOUR) 90 MG tablet, Take 90 mg by mouth every morning. , Disp: , Rfl:  .  trospium  (SANCTURA) 20 MG tablet, Take 20 mg by mouth 2 (two) times daily., Disp: , Rfl:  .  TURMERIC PO, Take 1 capsule by mouth daily., Disp: , Rfl:  No current facility-administered medications for this visit.  Facility-Administered Medications Ordered in Other Visits:  .  ondansetron (ZOFRAN) 4 mg in sodium chloride 0.9 % 50 mL IVPB, 4 mg, Intravenous, Q6H PRN, Ashok Pall, MD      Objective:   Vitals:   06/29/20 1329  BP: (!) 126/50  Pulse: 74  Temp: (!) 97 F (36.1 C)  SpO2: 96%  Weight: 191 lb (86.6 kg)  Height: _0  (1.702 m)    Estimated body mass index is 29.91 kg/m as calculated from the following:   Height as of this encounter: _1  (1.702 m).   Weight as of this encounter: 191 lb (86.6 kg).  _2 @  Filed Weights   06/29/20 1329  Weight: 191 lb (86.6 kg)     Physical Exam   General: No distress. Looks well Neuro: Alert and Oriented x 3. GCS 15. Speech normal Psych: Pleasant Resp:  Barrel Chest - no.  Wheeze - no, Crackles - maybe, No overt respiratory distress CVS: Normal heart sounds. Murmurs - n Ext: Stigmata of Connective Tissue Disease - stiff hand joints HEENT: Normal upper airway. PEERL +. No post nasal drip        Assessment:       ICD-10-CM   1. Encounter for medication management  Z79.899 Hepatic function panel       Plan:     Patient Instructions  Interstitial lung disease due to connective tissue disease ([place]) History of rheumatoid arthritis and immunosuppressed on leflunomide Hx of Agent Orange Exposure Vaccine counseling   - Pulmonary Fibrosis is every so slowly and steadiy progressive but stable since last visit May 2021 -There is some new research to suggest agent orange exposure is independently a risk factor for pulmonary fibrosis  -the government has not accepted  this fact as ye  - You are in the ILD-pro registry study [I just confirmed]  - last visit Nov 2021  Plan -Do blood IgG for Covid -if there is no  antibody response then will refer you to monoclonal antibody prophylaxis -Do liver function test today and again in 3 months  --Do spirometry and DLCO in 6 months  -Continue Esbriet per protocol [eat with food, space adequately between meals and apply sunscreen at all times]  -Continue daily exercise as before  -Email [employer] [email or web address] -who is a local support group leader and join the support group  Follow-up -Research team will contact you about the ILD-pro registry visit -Return to see [name] in a 30-minute slot in 6 months but after breathing test -Return sooner if needed       SIGNATURE    Dr. Brand Males, M.D., F.C.C.P,  Pulmonary and Critical Care Medicine Staff Physician, [place] Director - Interstitial Lung Disease  Program  Pulmonary [place] [facility] [place], [place], [address]  Pager: [phone], If no answer or between  15:00h - 7:00h: call [phone] Telephone: [phone]  2:07 PM 06/29/2020

## 2020-07-03 NOTE — Progress Notes (Signed)
LFT normal on anti fibrotic. Will not call with result

## 2020-07-05 ENCOUNTER — Other Ambulatory Visit: Payer: Self-pay | Admitting: Dermatology

## 2020-07-05 ENCOUNTER — Ambulatory Visit (INDEPENDENT_AMBULATORY_CARE_PROVIDER_SITE_OTHER): Payer: Medicare Other | Admitting: Dermatology

## 2020-07-05 ENCOUNTER — Other Ambulatory Visit: Payer: Self-pay

## 2020-07-05 DIAGNOSIS — C44622 Squamous cell carcinoma of skin of right upper limb, including shoulder: Secondary | ICD-10-CM | POA: Diagnosis not present

## 2020-07-05 DIAGNOSIS — L409 Psoriasis, unspecified: Secondary | ICD-10-CM | POA: Diagnosis not present

## 2020-07-05 DIAGNOSIS — D485 Neoplasm of uncertain behavior of skin: Secondary | ICD-10-CM

## 2020-07-05 NOTE — Patient Instructions (Addendum)
Wound Care Instructions  1. Cleanse wound gently with soap and water once a day then pat dry with clean gauze. Apply a thing coat of Petrolatum (petroleum jelly, "Vaseline") over the wound (unless you have an allergy to this). We recommend that you use a new, sterile tube of Vaseline. Do not pick or remove scabs. Do not remove the yellow or white "healing tissue" from the base of the wound.  2. Cover the wound with fresh, clean, nonstick gauze and secure with paper tape. You may use Band-Aids in place of gauze and tape if the would is small enough, but would recommend trimming much of the tape off as there is often too much. Sometimes Band-Aids can irritate the skin.  3. You should call the office for your biopsy report after 1 week if you have not already been contacted.  4. If you experience any problems, such as abnormal amounts of bleeding, swelling, significant bruising, significant pain, or evidence of infection, please call the office immediately.  5. FOR ADULT SURGERY PATIENTS: If you need something for pain relief you may take 1 extra strength Tylenol (acetaminophen) AND 2 Ibuprofen (213m each) together every 4 hours as needed for pain. (do not take these if you are allergic to them or if you have a reason you should not take them.) Typically, you may only need pain medication for 1 to 3 days.     For psoriasis, use Clobetasol apply twice daily as needed to affected areas for two weeks then apply twice daily on weekends only. Avoid applying to face, groin, and axilla. Use as directed. Risk of skin atrophy with long-term use reviewed.    Topical steroids (such as triamcinolone, fluocinolone, fluocinonide, mometasone, clobetasol, halobetasol, betamethasone, hydrocortisone) can cause thinning and lightening of the skin if they are used for too long in the same area. Your physician has selected the right strength medicine for your problem and area affected on the body. Please use your  medication only as directed by your physician to prevent side effects.    Recommend taking Heliocare sun protection supplement daily in sunny weather for additional sun protection. For maximum protection on the sunniest days, you can take up to 2 capsules of regular Heliocare OR take 1 capsule of Heliocare Ultra. For prolonged exposure (such as a full day in the sun), you can repeat your dose of the supplement 4 hours after your first dose. [facility]oc[facility] at [email or web address]

## 2020-07-05 NOTE — Progress Notes (Signed)
   Follow-Up Visit   Subjective  Cameron [name] is a 80 y.o. male who presents for the following: Follow-up (Patient here today for 2 month AK at right dorsal hand not treated at last visit due to holidays. Patient advises he does have a spot at right upper arm, present for about 3 weeks. It was sore but has gotten better. ).  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 Once improved he stopped using and is not flared.   The following portions of the chart were reviewed this encounter and updated as appropriate:   Tobacco  Allergies  Meds  Problems  Med Hx  Surg Hx  Fam Hx      Review of Systems:  No other skin or systemic complaints except as noted in HPI or Assessment and Plan.  Objective  Well appearing patient in no apparent distress; mood and affect are within normal limits.  A focused examination was performed including arms, hands, face. Relevant physical exam findings are noted in the Assessment and Plan.  Objective  Right Upper Arm: 0.7cm scaly pink papule R/o BCC  Objective  Right Elbow: Scaly pink plaque right elbow   Assessment & Plan  Neoplasm of uncertain behavior of skin Right Upper Arm  Skin / nail biopsy Type of biopsy: tangential   Informed consent: discussed and consent obtained   Timeout: patient name, date of birth, surgical site, and procedure verified   Patient was prepped and draped in usual sterile fashion: Area prepped with isopropyl alcohol. 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 Currently well-controlled.  No joint pain   Start clobetasol 0.05% foam apply twice daily as needed to affected areas for two weeks then apply twice daily on weekends only    Topical steroids (such as triamcinolone, fluocinolone, fluocinonide, mometasone, clobetasol, halobetasol, betamethasone, hydrocortisone) can cause thinning and lightening of the skin if they are used for too long in the same area. Your physician has selected the right strength medicine for your problem and area affected on the body. Please use your medication only as directed by your physician to prevent side effects.   Psoriasis is a chronic non-curable, but treatable genetic/hereditary disease that may have other systemic features affecting other organ systems such as joints (Psoriatic Arthritis). It is associated with an increased risk of inflammatory bowel disease, heart disease, non-alcoholic fatty liver disease, and depression.     Other Related Medications clobetasol (OLUX) 0.05 % topical foam ketoconazole (NIZORAL) 2 % cream  Return in about 1 year (around 07/05/2021) for TBSE.  Graciella Belton, RMA, am acting as scribe for Forest Gleason, MD .  Documentation: I have reviewed the above documentation for accuracy and completeness, and I agree with the above.  Forest Gleason, MD

## 2020-07-09 ENCOUNTER — Encounter: Payer: Self-pay | Admitting: Dermatology

## 2020-07-11 ENCOUNTER — Telehealth: Payer: Self-pay

## 2020-07-11 ENCOUNTER — Telehealth: Payer: Self-pay | Admitting: Emergency Medicine

## 2020-07-11 NOTE — Telephone Encounter (Signed)
Left msg for pt to call to schedule SU and FBSE, JS

## 2020-07-11 NOTE — Telephone Encounter (Signed)
Patient notified that Pacemaker reached ERI on 07/09/20. Will expect call from scheduler to see Dr [name] to discuss gen. change.

## 2020-07-11 NOTE — Telephone Encounter (Signed)
Called patient to schedule surgery needed to remove scc on right upper arm and 6 month tbse appointment. Spoke to patient's wife and scheduled appointments. She verbalized understanding and denied further questions.

## 2020-07-11 NOTE — Telephone Encounter (Signed)
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  Patient prefers excision.  MAs please call to schedule excision and also schedule for 67-monthfull body skin exam.  Thank you!

## 2020-07-11 NOTE — Telephone Encounter (Signed)
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  Patient prefers excision.  MAs please call to schedule excision and also schedule for 21-monthfull body skin exam.  Thank you!

## 2020-07-14 ENCOUNTER — Other Ambulatory Visit: Payer: Self-pay | Admitting: Dermatology

## 2020-07-14 DIAGNOSIS — L409 Psoriasis, unspecified: Secondary | ICD-10-CM

## 2020-07-16 ENCOUNTER — Ambulatory Visit (INDEPENDENT_AMBULATORY_CARE_PROVIDER_SITE_OTHER): Payer: Medicare Other

## 2020-07-16 DIAGNOSIS — I442 Atrioventricular block, complete: Secondary | ICD-10-CM

## 2020-07-16 LAB — CUP PACEART REMOTE DEVICE CHECK
Battery Remaining Longevity: 0 mo
Battery Voltage: 2.57 V
Brady Statistic AP VP Percent: 97 %
Brady Statistic AP VS Percent: 1 %
Brady Statistic AS VP Percent: 3.2 %
Brady Statistic AS VS Percent: 1 %
Brady Statistic RA Percent Paced: 97 %
Brady Statistic RV Percent Paced: 99 %
Date Time Interrogation Session: 20220221020008
Implantable Lead Implant Date: 20130716
Implantable Lead Implant Date: 20130716
Implantable Lead Location: 753859
Implantable Lead Location: 753860
Implantable Lead Model: 1948
Implantable Pulse Generator Implant Date: 20130716
Lead Channel Impedance Value: 300 Ohm
Lead Channel Impedance Value: 560 Ohm
Lead Channel Pacing Threshold Amplitude: 0.5 V
Lead Channel Pacing Threshold Amplitude: 0.5 V
Lead Channel Pacing Threshold Pulse Width: 0.5 ms
Lead Channel Pacing Threshold Pulse Width: 0.5 ms
Lead Channel Sensing Intrinsic Amplitude: 1.6 mV
Lead Channel Sensing Intrinsic Amplitude: 12 mV
Lead Channel Setting Pacing Amplitude: 0.75 V
Lead Channel Setting Pacing Amplitude: 1.5 V
Lead Channel Setting Pacing Pulse Width: 0.5 ms
Lead Channel Setting Sensing Sensitivity: 4 mV
Pulse Gen Model: 2210
Pulse Gen Serial Number: 7356500

## 2020-07-17 ENCOUNTER — Other Ambulatory Visit: Payer: Self-pay | Admitting: Adult Health

## 2020-07-17 ENCOUNTER — Telehealth: Payer: Self-pay

## 2020-07-17 NOTE — Telephone Encounter (Signed)
Patient wife called in about patient heart rate being 53 bpm and it has never been below 60 bpm before and is concerned

## 2020-07-17 NOTE — Telephone Encounter (Signed)
Spoke to patient wife and she expresses concern that the patients battery may not last and concerned its not working considering she saw his heart rate was 53 on a pulse ox. Explained that a PVC can cause the pulse ox to have a low reading and number jump around or if patient is out of SR. Explained that the battery has 3 months still after it has reached the ERI indicator. Has follow up with [name] on 3/2 to discuss gen change, patient aware. Advised to call with any further questions or concerns. Verbalized understanding.

## 2020-07-23 NOTE — Progress Notes (Signed)
Remote pacemaker transmission.   

## 2020-07-24 ENCOUNTER — Telehealth: Payer: Self-pay

## 2020-07-24 ENCOUNTER — Encounter: Payer: Self-pay | Admitting: *Deleted

## 2020-07-24 ENCOUNTER — Ambulatory Visit (INDEPENDENT_AMBULATORY_CARE_PROVIDER_SITE_OTHER): Payer: Medicare Other | Admitting: Dermatology

## 2020-07-24 ENCOUNTER — Other Ambulatory Visit: Payer: Self-pay

## 2020-07-24 ENCOUNTER — Encounter: Payer: Self-pay | Admitting: Dermatology

## 2020-07-24 DIAGNOSIS — Z85828 Personal history of other malignant neoplasm of skin: Secondary | ICD-10-CM

## 2020-07-24 DIAGNOSIS — C44622 Squamous cell carcinoma of skin of right upper limb, including shoulder: Secondary | ICD-10-CM | POA: Diagnosis not present

## 2020-07-24 HISTORY — DX: Personal history of other malignant neoplasm of skin: Z85.828

## 2020-07-24 MED ORDER — MUPIROCIN 2 % EX OINT: 1.0000 "application " | TOPICAL_OINTMENT | Freq: Every day | CUTANEOUS | 1 refills | Status: DC

## 2020-07-24 NOTE — Telephone Encounter (Signed)
Spoke with pt wife, she reports she already heard from Dr. Jackalyn Lombard nurse and pt is scheduled to see MD tomorrow.

## 2020-07-24 NOTE — Patient Instructions (Addendum)
Medication Instructions:  Stop Plavix  Your physician recommends that you continue on your current medications as directed. Please refer to the Current Medication list given to you today.  Labwork: CBC, BMP  Testing/Procedures: Your physician has recommended that you have a pacemaker battery change. A pacemaker is a small device that is placed under the skin of your chest or abdomen to help control abnormal heart rhythms. This device uses electrical pulses to prompt the heart to beat at a normal rate. Pacemakers are used to treat heart rhythms that are too slow. Wire (leads) are attached to the pacemaker that goes into the chambers of you heart.    Any Other Special Instructions Will Be Listed Below (If Applicable).  If you need a refill on your cardiac medications before your next appointment, please call your pharmacy.    [employer] (26th ed., pp. [phone]). [place], [place]: Elsevier."> Clinical Cardiac Pacing, Defibrillation and Resynchronization Therapy (5th ed., pp. 251-269). [place], [place]: Elsevier.">  Pacemaker Battery Change  A pacemaker battery usually lasts 5-15 years (6-7 years on average). A few times a year, you may be asked to visit your health care provider to have a full evaluation of your pacemaker. When the battery is low, your pacemaker will be completely replaced. Most often, this procedure is simpler than the first surgery because the wires (leads) that connect the pacemaker to the heart are already in place. There are many things that affect how long a pacemaker battery will last, including:  The age of the pacemaker.  The number of leads you have(1, 2, or 3).  The use or workload of the pacemaker. If the pacemaker is helping the heart more often, the battery will not last as long.  Power (voltage) settings. Tell a health care provider about:  Any allergies you have.  All medicines you are taking, including vitamins, herbs, eye drops, creams, and  over-the-counter medicines.  Any problems you or family members have had with anesthetic medicines.  Any blood disorders you have.  Any surgeries you have had, especially any surgeries you have had since your last pacemaker was placed.  Any medical conditions you have.  Whether you are pregnant or may be pregnant. What are the risks? Generally, this is a safe procedure. However, problems may occur, including:  Bleeding.  Infection.  Nerve damage.  Allergic reaction to medicines.  Damage to the leads that go to the heart. What happens before the procedure? Staying hydrated Follow instructions from your health care provider about hydration, which may include:  Up to 2 hours before the procedure - you may continue to drink clear liquids, such as water, clear fruit juice, black coffee, and plain tea. Eating and drinking restrictions Follow instructions from your health care provider about eating and drinking restrictions, which may include:  8 hours before the procedure - stop eating heavy meals or foods, such as meat, fried foods, or fatty foods.  6 hours before the procedure - stop eating light meals or foods, such as toast or cereal.  6 hours before the procedure - stop drinking milk or drinks that contain milk.  2 hours before the procedure - stop drinking clear liquids. Medicines Ask your health care provider about:  Changing or stopping your regular medicines. This is especially important if you are taking diabetes medicines or blood thinners.  Taking medicines such as aspirin and ibuprofen. These medicines can thin your blood. Do not take these medicines unless your health care provider tells you to take  them.  Taking over-the-counter medicines, vitamins, herbs, and supplements. General instructions  Ask your health care provider what steps will be taken to help prevent infection. These may include: ? Removing hair at the surgery site. ? Washing skin with a  germ-killing soap. ? Receiving antibiotic medicine.  Plan to have someone take you home from the hospital or clinic.  If you will be going home right after the procedure, plan to have someone with you for 24 hours. What happens during the procedure?  An IV will be inserted into one of your veins.  You will be given one or more of the following: ? A medicine to help you relax (sedative). ? A medicine to numb the area where the pacemaker is located (local anesthetic).  Your health care provider will make an incision to reopen the pocket holding the pacemaker.  The old pacemaker will be disconnected from the leads.  The leads will be tested.  If needed, the leads will be replaced. If the leads are functioning properly, the new pacemaker will be connected to the existing leads.  A heart monitor and a pacemaker programmer will be used to make sure that the newly implanted pacemaker is working properly.  The incision site will be closed with stitches (sutures), adhesive strips, or skin glue.  A bandage (dressing) will be placed over the pacemaker site. The procedure may vary among health care providers and hospitals. What happens after the procedure?  Your blood pressure, heart rate, breathing rate, and blood oxygen level will be monitored until you leave the hospital or clinic.  You may be given antibiotics.  Your health care provider will tell you when your pacemaker will need to be tested again, or when to return to the office for removal of the dressing and sutures.  If you were given a sedative during the procedure, it can affect you for several hours. Do not drive or operate machinery until your health care provider says that it is safe.  You will be given a pacemaker identification card. This card lists the implant date, device model, and manufacturer of your pacemaker. Summary  A pacemaker battery usually lasts 5-15 years (6-7 years on average).  When the battery is low,  your pacemaker will need to be replaced.  Most often, this procedure is simpler than the first surgery because the wires (leads) that connect the pacemaker to the heart are already in place.  Risks of this procedure include bleeding, infection, and allergic reactions to medicines. This information is not intended to replace advice given to you by your health care provider. Make sure you discuss any questions you have with your health care provider. Document Revised: 04/14/2019 Document Reviewed: 04/14/2019 Elsevier Patient Education  2021 [employer].

## 2020-07-24 NOTE — Telephone Encounter (Signed)
Pt wife called stating for the past week or two the patient heart rate been in the 50's. She would like to know if that is something she should be concerned about. She would like for the nurse to give her a call back.

## 2020-07-24 NOTE — Telephone Encounter (Signed)
Left message for spouse to return my call.

## 2020-07-24 NOTE — Telephone Encounter (Signed)
Patient and his spouse called this afternoon questioning the vitamin Dr. Laurence Ferrari recommended. She said he was told it was found in drug stores so I was confused if it was HelioCare or not? Thank you

## 2020-07-24 NOTE — Telephone Encounter (Signed)
I recommend Nicotinamide 512m twice per day to lower risk of non-melanoma skin cancer by approximately 25%. I have had trouble finding it locally so it is best to order online.   I also recommend taking Heliocare sun protection supplement daily in sunny weather for additional sun protection. For maximum protection on the sunniest days, you can take up to 2 capsules of regular Heliocare OR take 1 capsule of Heliocare Ultra. For prolonged exposure (such as a full day in the sun), you can repeat your dose of the supplement 4 hours after your first dose. Hel[facility]ocare [facility] be [facility] at [facility] at [email or web address]   Thank you!

## 2020-07-24 NOTE — Progress Notes (Signed)
Follow-Up Visit   Subjective  Cameron [name] is a 80 y.o. male who presents for the following: Procedure (Patient here today for excision of bx proven SCC at right upper arm. ).   The following portions of the chart were reviewed this encounter and updated as appropriate:   Tobacco  Allergies  Meds  Problems  Med Hx  Surg Hx  Fam Hx      Review of Systems:  No other skin or systemic complaints except as noted in HPI or Assessment and Plan.  Objective  Well appearing patient in no apparent distress; mood and affect are within normal limits.  A focused examination was performed including right arm. Relevant physical exam findings are noted in the Assessment and Plan.  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  Skin excision  Lesion length (cm):  1.3 Lesion width (cm):  1.3 Margin per side (cm):  0.5 Total excision diameter (cm):  2.3 Informed consent: discussed and consent obtained   Timeout: patient name, date of birth, surgical site, and procedure verified   Procedure prep:  Patient was prepped and draped in usual sterile fashion Prep type:  Chlorhexidine Anesthesia: the lesion was anesthetized in a standard fashion   Anesthesia comment:  13cc Anesthetic:  1% lidocaine w/ epinephrine 1-100,000 buffered w/ 8.4% NaHCO3 Instrument used: #15 blade   Hemostasis achieved with: suture and pressure   Hemostasis achieved with comment:  Short pulse electrocautery Outcome: patient tolerated procedure well with no complications   Post-procedure details: wound care instructions given   Additional details:  Mupirocin and a pressure dressing applied  Skin repair Complexity:  Intermediate Final length (cm):  6.2 Informed consent: discussed and consent obtained   Timeout: patient name, date of birth, surgical site, and procedure verified   Procedure prep:  Patient was  prepped and draped in usual sterile fashion Prep type:  Chlorhexidine Anesthesia: the lesion was anesthetized in a standard fashion   Anesthetic:  1% lidocaine w/ epinephrine 1-100,000 local infiltration Reason for type of repair: reduce tension to allow closure, reduce the risk of dehiscence, infection, and necrosis and enhance both functionality and cosmetic results   Undermining: edges undermined   Subcutaneous layers (deep stitches):  Suture size:  3-0 Suture type: Vicryl (polyglactin 910)   Stitches:  Buried vertical mattress Fine/surface layer approximation (top stitches):  Suture size:  4-0 Suture type: Prolene (polypropylene)   Suture removal (days):  7 Hemostasis achieved with: suture, pressure and electrodesiccation Outcome: patient tolerated procedure well with no complications   Post-procedure details: wound care instructions given   Additional details:  Mupirocin and a pressure dressing applied  Specimen 1 - Surgical pathology Differential Diagnosis: bx proven SQUAMOUS CELL CARCINOMA WITH SUPERFICIAL INFILTRATION  Check Margins: yes Healing bx site [id number] Tagged at inf edge  Ordered Medications: mupirocin ointment (BACTROBAN) 2 %  Return in about 1 week (around 07/31/2020) for Suture Removal.  Graciella Belton, RMA, am acting as scribe for Forest Gleason, MD .  Documentation: I have reviewed the above documentation for accuracy and completeness, and I agree with the above.  Forest Gleason, MD

## 2020-07-24 NOTE — Patient Instructions (Signed)
Wound Care Instructions  1. Cleanse wound gently with soap and water once a day then pat dry with clean gauze. Apply a thing coat of Petrolatum (petroleum jelly, "Vaseline") over the wound (unless you have an allergy to this). We recommend that you use a new, sterile tube of Vaseline. Do not pick or remove scabs. Do not remove the yellow or white "healing tissue" from the base of the wound.  2. Cover the wound with fresh, clean, nonstick gauze and secure with paper tape. You may use Band-Aids in place of gauze and tape if the would is small enough, but would recommend trimming much of the tape off as there is often too much. Sometimes Band-Aids can irritate the skin.  3. You should call the office for your biopsy report after 1 week if you have not already been contacted.  4. If you experience any problems, such as abnormal amounts of bleeding, swelling, significant bruising, significant pain, or evidence of infection, please call the office immediately.  5. FOR ADULT SURGERY PATIENTS: If you need something for pain relief you may take 1 extra strength Tylenol (acetaminophen) AND 2 Ibuprofen (271m each) together every 4 hours as needed for pain. (do not take these if you are allergic to them or if you have a reason you should not take them.) Typically, you may only need pain medication for 1 to 3 days.

## 2020-07-25 ENCOUNTER — Encounter: Payer: Self-pay | Admitting: Internal Medicine

## 2020-07-25 ENCOUNTER — Telehealth: Payer: Self-pay

## 2020-07-25 ENCOUNTER — Ambulatory Visit (INDEPENDENT_AMBULATORY_CARE_PROVIDER_SITE_OTHER): Payer: Medicare Other | Admitting: Internal Medicine

## 2020-07-25 VITALS — BP 132/64 | HR 56 | Ht 67.0 in | Wt 192.0 lb

## 2020-07-25 DIAGNOSIS — I442 Atrioventricular block, complete: Secondary | ICD-10-CM | POA: Diagnosis not present

## 2020-07-25 DIAGNOSIS — I2510 Atherosclerotic heart disease of native coronary artery without angina pectoris: Secondary | ICD-10-CM

## 2020-07-25 DIAGNOSIS — I10 Essential (primary) hypertension: Secondary | ICD-10-CM
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
## 2020-07-25 NOTE — Telephone Encounter (Signed)
Patient doing well following yesterday's surgery, JS

## 2020-07-25 NOTE — Telephone Encounter (Signed)
Spoke with patient and forwarded recommendations on nicotinamide and helio care via Mychart at his request, JS

## 2020-07-26 LAB — BASIC METABOLIC PANEL
BUN/Creatinine Ratio: 24 (ref 10–24)
BUN: 30 mg/dL — ABNORMAL HIGH (ref 8–27)
CO2: 23 mmol/L (ref 20–29)
Calcium: 10 mg/dL (ref 8.6–10.2)
Chloride: 105 mmol/L (ref 96–106)
Creatinine, Ser: 1.27 mg/dL (ref 0.76–1.27)
Glucose: 99 mg/dL (ref 65–99)
Potassium: 5.5 mmol/L — ABNORMAL HIGH (ref 3.5–5.2)
Sodium: 142 mmol/L (ref 134–144)
eGFR: 57 mL/min/{1.73_m2} — ABNORMAL LOW (ref >59)

## 2020-07-26 LAB — CBC WITH DIFFERENTIAL/PLATELET
Basophils Absolute: 0.1 10*3/uL (ref 0.0–0.2)
Basos: 1 %
EOS (ABSOLUTE): 0.3 10*3/uL (ref 0.0–0.4)
Eos: 3 %
Hematocrit: 42.3 % (ref 37.5–51.0)
Hemoglobin: 14.3 g/dL (ref 13.0–17.7)
Immature Grans (Abs): 0 10*3/uL (ref 0.0–0.1)
Immature Granulocytes: 0 %
Lymphocytes Absolute: 2.6 10*3/uL (ref 0.7–3.1)
Lymphs: 33 %
MCH: 30.5 pg (ref 26.6–33.0)
MCHC: 33.8 g/dL (ref 31.5–35.7)
MCV: 90 fL (ref 79–97)
Monocytes Absolute: 0.8 10*3/uL (ref 0.1–0.9)
Monocytes: 11 %
Neutrophils Absolute: 4.1 10*3/uL (ref 1.4–7.0)
Neutrophils: 52 %
Platelets: 195 10*3/uL (ref 150–450)
RBC: 4.69 x10E6/uL (ref 4.14–5.80)
RDW: 13 % (ref 11.6–15.4)
WBC: 7.9 10*3/uL (ref 3.4–10.8)

## 2020-07-27 ENCOUNTER — Telehealth: Payer: Self-pay | Admitting: Internal Medicine

## 2020-07-27 NOTE — Telephone Encounter (Signed)
Called and spoke to wife. Patient was scheduled for first available while in office. I reassured her that this was not urgent and it is okay to wait until the scheduled time. She was grateful for the call and verbalized understanding.

## 2020-07-27 NOTE — Telephone Encounter (Signed)
     Pt's wife calling if there's a chance to get an earlier schedule for pt's procedure

## 2020-07-31 ENCOUNTER — Other Ambulatory Visit: Payer: Self-pay

## 2020-07-31 ENCOUNTER — Encounter: Payer: Self-pay | Admitting: Dermatology

## 2020-07-31 ENCOUNTER — Ambulatory Visit (INDEPENDENT_AMBULATORY_CARE_PROVIDER_SITE_OTHER): Payer: Medicare Other | Admitting: Dermatology

## 2020-07-31 DIAGNOSIS — C44622 Squamous cell carcinoma of skin of right upper limb, including shoulder: Secondary | ICD-10-CM

## 2020-07-31 DIAGNOSIS — C4492 Squamous cell carcinoma of skin, unspecified: Secondary | ICD-10-CM

## 2020-07-31 DIAGNOSIS — Z4802 Encounter for removal of sutures: Secondary | ICD-10-CM

## 2020-07-31 MED ORDER — DOXYCYCLINE MONOHYDRATE 100 MG PO TABS: ORAL_TABLET | ORAL | 0 refills | Status: DC

## 2020-07-31 NOTE — Progress Notes (Signed)
   Follow-Up Visit   Subjective  Cameron [name] is a 80 y.o. male who presents for the following: Suture / Staple Removal (Suture removal of Biopsy proven SCC R upper arm excision 07/24/2020).    The following portions of the chart were reviewed this encounter and updated as appropriate:   Tobacco  Allergies  Meds  Problems  Med Hx  Surg Hx  Fam Hx      Review of Systems:  No other skin or systemic complaints except as noted in HPI or Assessment and Plan.  Objective  Well appearing patient in no apparent distress; mood and affect are within normal limits.  A focused examination was performed including R upper arm . Relevant physical exam findings are noted in the Assessment and Plan.  Objective  Right Upper Arm - Anterior: Edema and mild erythema at incision site. Incision site clean, dry and intact.   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  - Discussed pathology results showing SCC, margins clear - Patient advised to keep steri-strips dry until they fall off. - Scars remodel for a full year. - Once steri-strips fall off, patient can apply over-the-counter silicone scar cream each night to help with scar remodeling if desired. - Patient advised to call with any concerns or if they notice any new or changing lesions.   Start Doxycycline Monohydrate 100 mg take 1 tablet bid with food x 7 days   Ordered Medications: doxycycline (ADOXA) 100 MG tablet  Return in about 6 months (around 01/31/2021) for TBSE .  I, Marye Round, CMA, am acting as scribe for Forest Gleason, MD .  Documentation: I have reviewed the above documentation for accuracy and completeness, and I agree with the above.  Forest Gleason, MD

## 2020-07-31 NOTE — Patient Instructions (Signed)
Doxycycline should be taken with food to prevent nausea. Do not lay down for 30 minutes after taking. Be cautious with sun exposure and use good sun protection while on this medication. Pregnant women should not take this medication.

## 2020-08-03 ENCOUNTER — Other Ambulatory Visit: Payer: Self-pay | Admitting: *Deleted

## 2020-08-07 ENCOUNTER — Other Ambulatory Visit (HOSPITAL_COMMUNITY)
Admission: RE | Admit: 2020-08-07 | Discharge: 2020-08-07 | Disposition: A | Discharge status: Home / Self Care | Payer: Medicare Other | Source: Ambulatory Visit | Attending: Internal Medicine | Admitting: Internal Medicine

## 2020-08-07 DIAGNOSIS — Z01812 Encounter for preprocedural laboratory examination: Secondary | ICD-10-CM | POA: Insufficient documentation

## 2020-08-07 DIAGNOSIS — Z20822 Contact with and (suspected) exposure to covid-19: Secondary | ICD-10-CM | POA: Insufficient documentation

## 2020-08-07 LAB — SARS CORONAVIRUS 2 (TAT 6-24 HRS): SARS Coronavirus 2: NEGATIVE

## 2020-08-07 NOTE — Telephone Encounter (Signed)
mychart message sent by pt's wife stating that pt is going to be getting pacemaker replaced 3/17 and also stated that they have not heard anything about the new study and are wanting an update. Please advise.

## 2020-08-08 ENCOUNTER — Encounter: Payer: Self-pay | Admitting: Cardiovascular Disease

## 2020-08-08 ENCOUNTER — Ambulatory Visit (INDEPENDENT_AMBULATORY_CARE_PROVIDER_SITE_OTHER): Payer: Medicare Other | Admitting: Cardiovascular Disease

## 2020-08-08 ENCOUNTER — Other Ambulatory Visit: Payer: Self-pay

## 2020-08-08 VITALS — BP 130/60 | HR 55 | Ht 67.0 in | Wt 191.0 lb

## 2020-08-08 DIAGNOSIS — I2510 Atherosclerotic heart disease of native coronary artery without angina pectoris: Secondary | ICD-10-CM | POA: Diagnosis not present

## 2020-08-08 DIAGNOSIS — I10 Essential (primary) hypertension: Secondary | ICD-10-CM

## 2020-08-08 DIAGNOSIS — G4733 Obstructive sleep apnea (adult) (pediatric): Secondary | ICD-10-CM | POA: Diagnosis not present

## 2020-08-08 DIAGNOSIS — Z950 Presence of cardiac pacemaker: Secondary | ICD-10-CM | POA: Diagnosis not present

## 2020-08-08 DIAGNOSIS — E119 Type 2 diabetes mellitus without complications: Secondary | ICD-10-CM

## 2020-08-08 DIAGNOSIS — J849 Interstitial pulmonary disease, unspecified: Secondary | ICD-10-CM | POA: Diagnosis not present

## 2020-08-08 DIAGNOSIS — Z9989 Dependence on other enabling machines and devices: Secondary | ICD-10-CM

## 2020-08-08 DIAGNOSIS — E039 Hypothyroidism, unspecified: Secondary | ICD-10-CM

## 2020-08-08 NOTE — Pre-Procedure Instructions (Signed)
Attempted to call patient regarding procedure instructions for tomorrow.  Left a voice mail on the following items: Arrival time 1130 Nothing to eat or drink after midnight No meds AM of procedure Responsible person to drive you home and stay with you for 24 hrs Wash with special soap night before and morning of procedure

## 2020-08-08 NOTE — Progress Notes (Signed)
Cardiology Office Note    Date:  08/13/2020   ID:  Cameron [name], DOB 1940/08/30, MRN [id number]  PCP:  Cameron Crouch, MD  Cardiologist:  Cameron Majestic, MD  EP: Cameron [name]  7-monthfollow-up evaluation  History of Present Illness:  Cameron [name] a 80y.o. 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 [facility], Dr. TRadford Paxwith reference to obstructive sleep apnea, and Dr. ARayann Hemanfor his EP and pacemaker. He also has seen Dr. RLubertha Sayresbecause of increasing shortness of breath. 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 He was also found to have aortic atherosclerosis with coronary calcification and enlarged pulmonary arteries with a peak [name] pressure at 55 mm. He was referred to Dr. MAundra Dubinwho performed a diagnostic cardiac catheterization on August 11, 2017. He was found to have normal left and right heart filling pressures.There was mild pulmonary hypertension with suspicion for group 3 related to interstitial lung disease. He was found to have severe disease in his proximal circumflex, proximal OM1 proximal diagonal 3 and was felt that his dyspnea was the result of combination of parenchymal lung disease and coronary artery disease. I was asked to perform invention to this bifurcation stenosis of his circumflex and OM1 vessel. His procedurewas difficult but successful with an excellent result in a 2.5 x 15 mm Resolute stent was inserted into the OM 1 vessel at the ostium and a 3.0 x 18 mm Resolute DES stent was inserted from the proximal circumflex into the mid AV groove circumflex beyond the OM1 vessel.  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 That was the only time that I had seen the patient. Apparently, the patient was recently seen by Dr. ARayann Hemanwho recommended he see me for his general cardiology care. He presents for evaluation.   Since his intervention, he denies any anginal type symptoms. He had noticed a rare episode of dizziness. He has obstructive sleep apnea and has been on CPAP therapy for 2 months and was followed by Dr. TRadford Paxfor this.   He underwent carotid ultrasound imaging in June 2019 which showed minimal to moderate bilateral atherosclerotic plaque.  A head CT without contrast did not show any evidence of acute intracranial abnormality and showed mild atrophy and chronic small vessel white matter ischemic changes.  He has been evaluated by Dr. ARayann Hemanwho follows his pacemaker.  He was evaluated by Cameron Deforest [name] with several occasions and was last seen in January 2021.  That time, he he had mild shortness of breath symptoms only when he performs strenuous activity.  He did not have any anginal type chest pain.  He was having lower extremity mild edema and he was given a prescription for Lasix to take on an as-needed basis.  I last evaluated him in a telemedicine visit on July 05, 2019.  He has continued to be followed by Dr. JFulton Reekfor his primary care.  He is scheduled to undergo a breathing test with Dr. RChase [name]  He has noticed his blood pressure to be elevated as well as his heart rate in the upper 90s.  At that time, lisinopril was titrated to 20 mg  and Bystolic was increased. He denies any anginal symptoms.  He has continued to use CPAP.  A download was obtained from April 02, 2019 through June 30, 2019 and he is meeting compliance with 97% of usage days.  Average CPAP use is 7 hours and 6 minutes.  At his set pressure of 7 cm, AHI is excellent at 1.3.   He was seen by Cameron Deforest, [name] on September 05, 2019  his blood pressure was improved.  He had  presented to the emergency room in March 2021 with elevated potassium.   I last saw him in September 2021.  He was not having any anginal symptomatology.  Blood pressure continued to be elevated despite taking lisinopril 20 mg and Bystolic 10 mg and I recommended further titration of lisinopril to 30 mg.  He has continued to to be evaluated by Dr. Chase [name] for his interstitial lung disease.  He  has noticed further blood pressure elevation despite taking Bystolic 10 mg, lisinopril 20 mg.  He has a prescription for furosemide but rarely takes this.  He continues to be on DAPT with aspirin/Plavix.  He is on Esbriet for his interstitial lung disease.  He is diabetic on glimepiride in addition to Metformin.  He was recently started on a prednisone taper.  He continues to use CPAP with excellent compliance.    Since his last evaluation, he has been evaluated by Cameron [name] and is in need for pacemaker replacement as well as removal and replacement of his atrial lead.  He has continued to walk at least 1 mile 3 days/week.  He has continued to use CPAP and a download was obtained from February 14 through August 07, 2020 which continues to show excellent compliance with average use of 6 hours and 7 minutes per night.  At 7 cm water pressure AHI is excellent at 1.3/h.  He denies any anginal symptoms.  His blood pressure improved with his medication adjustments from his last office visit.  He presents for evaluation.   Past Medical History:  Diagnosis Date  . Appendicitis   . Atrial fibrillation ([place]) 12/12/2013  . BPH (benign prostatic hypertrophy)   . CAD (coronary artery disease) 12/2015   Cath by Dr Cameron [name] reveals distal and small vessel CAD.  Medical therapy advised.  . Chest pain 12/03/2015  . CHF (congestive heart failure) ([place])   . 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 Coronary artery disease   . Coronary artery disease involving native coronary artery of native heart with unstable angina pectoris  ([place]) 08/11/2017  . History of blood transfusion 1968   "probably; related to getting wounded in [employer]"  . History of kidney stones   . 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 Hyperglycemia 11/05/2013  . Hyperlipidemia 11/05/2013  . Hypertension   . Hypothyroidism   . Hypothyroidism, unspecified 11/05/2013  . Inflammatory arthritis 11/05/2013  . Onychomycosis 12/20/2015  . OSA (obstructive sleep apnea) 10/26/2017    AHI of 8.1/h overall and 6.2/h during REM sleep.  AHI was 20/h while supine.  Oxygen saturations dropped to 87%.  Now on CPAP at 7cm H2O  . OSA on CPAP   . 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 Rheumatoid arthritis ([place])    "hands" (08/11/2017)  . Type II diabetes mellitus ([place])     Past Surgical History:  Procedure Laterality Date  . BACK SURGERY    .  CARDIAC CATHETERIZATION N/A 12/28/2015   Procedure: Left Heart Cath and Coronary Angiography;  Surgeon: Belva Crome, MD;  Location: [place] [facility];  Service: Cardiovascular;  Laterality: N/A;  . CATARACT EXTRACTION W/ INTRAOCULAR LENS  IMPLANT, BILATERAL Bilateral   . CORONARY ANGIOPLASTY WITH STENT PLACEMENT  08/11/2017   "2 stents"  . CORONARY STENT INTERVENTION N/A 08/11/2017   Procedure: CORONARY STENT INTERVENTION;  Surgeon: Troy Sine, MD;  Location: [place] [facility];  Service: Cardiovascular;  Laterality: N/A;  . CYSTOSCOPY W/ STONE MANIPULATION    . INGUINAL HERNIA REPAIR Left   . INSERT / REPLACE / REMOVE PACEMAKER  12/09/2011   SJM Accent DR RF implanted by DR Allred for complete heart block and syncope  . JOINT REPLACEMENT    . LAPAROSCOPIC CHOLECYSTECTOMY    . LEAD REVISION/REPAIR N/A 08/09/2020   Procedure: LEAD REVISION/REPAIR;  Surgeon: [name], MD;  Location: [place] [facility];  Service: Cardiovascular;  Laterality: N/A;  . LITHOTRIPSY    . LUMBAR DISC SURGERY     "removed arthritis and  spurs"  . PERMANENT PACEMAKER INSERTION N/A 12/09/2011   Procedure: PERMANENT PACEMAKER INSERTION;  Surgeon: [name], MD;  Location: [name];  Service: Cardiovascular;  Laterality: N/A;  . PPM GENERATOR CHANGEOUT N/A 08/09/2020   Procedure: PPM GENERATOR CHANGEOUT;  Surgeon: [name], MD;  Location: [place] [facility];  Service: Cardiovascular;  Laterality: N/A;  . REPLACEMENT TOTAL KNEE Right   . RIGHT/LEFT HEART CATH AND CORONARY ANGIOGRAPHY N/A 08/11/2017   Procedure: RIGHT/LEFT HEART CATH AND CORONARY ANGIOGRAPHY;  Surgeon: Larey Dresser, MD;  Location: [place] [facility];  Service: Cardiovascular;  Laterality: N/A;  . TRANSURETHRAL RESECTION OF PROSTATE  2017/2018    Current Medications: Outpatient Medications Prior to Visit  Medication Sig Dispense Refill  . acetaminophen (TYLENOL) 500 MG tablet Take 500 mg by mouth every 6 (six) hours as needed for moderate pain.    Marland Kitchen aspirin 81 MG EC tablet Take 81 mg by mouth daily.    . betamethasone dipropionate (DIPROLENE) 0.05 % ointment Apply 1 application topically daily.    . clobetasol (OLUX) 0.05 % topical foam APPLY TOPICALLY 2 TIMES DAILY 100 g 1  . ESBRIET 267 MG TABS TAKE 3 TABLETS THREE TIMES A DAY WITH MEALS (MAINTENANCE DOSE, START AFTER COMPLETED STARTER DOSE) (Patient taking differently: Take 801 mg by mouth 3 (three) times daily with meals.) 270 tablet 11  . gabapentin (NEURONTIN) 300 MG capsule Take 300 mg by mouth at bedtime.    Marland Kitchen glimepiride (AMARYL) 2 MG tablet Take 2 mg by mouth every morning.    . leflunomide (ARAVA) 10 MG tablet Take 10 mg by mouth daily.    . metFORMIN (GLUCOPHAGE) 500 MG tablet Take 500 mg by mouth daily.    . mupirocin ointment (BACTROBAN) 2 % Apply 1 application topically daily. 22 g 1  . nebivolol (BYSTOLIC) 5 MG tablet Take 5 mg by mouth daily.    . Omega-3 Fatty Acids (OMEGA-3 FISH OIL PO) Take 1 capsule by mouth daily.    . pravastatin (PRAVACHOL) 20 MG tablet Take 1 tablet (20 mg  total) by mouth every evening. *NEEDS OFFICE VISIT FOR FURTHER REFILLS* (Patient taking differently: Take 20 mg by mouth every evening.) 90 tablet 0  . telmisartan (MICARDIS) 80 MG tablet Take 80 mg by mouth daily.    Marland Kitchen thyroid (ARMOUR) 90 MG tablet Take 90 mg by mouth every morning.     Marland Kitchen  trospium (SANCTURA) 20 MG tablet Take 20 mg by mouth 2 (two) times daily.    . TURMERIC PO Take 1 capsule by mouth daily.    Marland Kitchen doxycycline (ADOXA) 100 MG tablet Take 1 tablet twice a day with food x 7 days 14 tablet 0   Facility-Administered Medications Prior to Visit  Medication Dose Route Frequency Provider Last Rate Last Admin  . ondansetron (ZOFRAN) 4 mg in sodium chloride 0.9 % 50 mL IVPB  4 mg Intravenous Q6H PRN Ashok Pall, MD         Allergies:   Ace inhibitors and Celecoxib   Social History   Socioeconomic History  . Marital status: Married    Spouse name: Not on file  . Number of children: Not on file  . Years of education: Not on file  . Highest education level: Not on file  Occupational History  . Not on file  Tobacco Use  . Smoking status: Former Smoker    Packs/day: 1.00    Years: 5.00    Pack years: 5.00    Types: Cigarettes    Quit date: 07/10/1972    Years since quitting: 48.1  . Smokeless tobacco: Former Systems developer    Types: [place]inda date: Research officer, trade union  . Vaping Use: Never used  Substance and Sexual Activity  . Alcohol use: Yes    Alcohol/week: 1.0 standard drink    Types: 1 Glasses of wine per week  . Drug use: No  . Sexual activity: Not Currently  Other Topics Concern  . Not on file  Social History Narrative  . Not on file   Social Determinants of Health   Financial Resource Strain: Not on file  Food Insecurity: Not on file  Transportation Needs: Not on file  Physical Activity: Not on file  Stress: Not on file  Social Connections: Not on file     Family History:  The patient's family history includes Alzheimer's disease in his paternal grandmother  and sister; Arthritis in his maternal grandfather; Bone cancer in his father; Heart attack in his paternal uncle; Lung cancer in his paternal grandfather; Pancreatic cancer in his mother.   ROS General: Negative; No fevers, chills, or night sweats;  HEENT: Negative; No changes in vision or hearing, sinus congestion, difficulty swallowing Pulmonary: Negative; No cough, wheezing, shortness of breath, hemoptysis Cardiovascular: Negative; No chest pain, presyncope, syncope, palpitations GI: Negative; No nausea, vomiting, diarrhea, or abdominal pain GU: BPH followed by [facility] urology Musculoskeletal: Negative; no myalgias, joint pain, or weakness Hematologic/Oncology: Negative; no easy bruising, bleeding Endocrine: Negative; no heat/cold intolerance; no diabetes Neuro: Negative; no changes in balance, headaches Skin: Negative; No rashes or skin lesions Psychiatric: Negative; No behavioral problems, depression Sleep: OSA on CPAP, no breakthrough snoring.  No daytime sleepiness. Other comprehensive 14 point system review is negative.   PHYSICAL EXAM:   VS:  BP 130/60   Pulse (!) 55   Ht _0  (1.702 m)   Wt 191 lb (86.6 kg)   BMI 29.91 kg/m    Repeat blood pressure by me was 134/70    Wt Readings from Last 3 Encounters:  08/09/20 191 lb (86.6 kg)  08/08/20 191 lb (86.6 kg)  07/25/20 192 lb (87.1 kg)    General: Alert, oriented, no distress.  Skin: normal turgor, no rashes, warm and dry HEENT: Normocephalic, atraumatic. Pupils equal round and reactive to light; sclera anicteric; extraocular muscles intact;  Nose without nasal septal hypertrophy Mouth/Parynx benign;  Mallinpatti scale 3 Neck: No JVD, no carotid bruits; normal carotid upstroke Lungs: clear to ausculatation and percussion; no wheezing or rales Chest wall: without tenderness to palpitation Heart: PMI not displaced, RRR, s1 s2 normal, 1/6 systolic murmur, no diastolic murmur, no rubs, gallops, thrills, or  heaves Abdomen: soft, nontender; no hepatosplenomehaly, BS+; abdominal aorta nontender and not dilated by palpation. Back: no CVA tenderness Pulses 2+ Musculoskeletal: full range of motion, normal strength, no joint deformities Extremities: no clubbing cyanosis or edema, Homan's sign negative  Neurologic: grossly nonfocal; Cranial nerves grossly wnl Psychologic: Normal mood and affect   Studies/Labs Reviewed:   EKG:  EKG is ordered today. ECG (independently read by me): AV paced at 55  September 2021 ECG (independently read by me): AV paced rhythm at 61 bpm  Recent Labs: BMP Latest Ref Rng & Units 07/25/2020 08/02/2019 08/01/2019  Glucose 65 - 99 mg/dL 99 70 200(H)  BUN 8 - 27 mg/dL 30(H) 28(H) 26  Creatinine 0.76 - 1.27 mg/dL 1.27 1.26(H) 1.04  BUN/Creat Ratio 10 - 24 24 - 25(H)  Sodium 134 - 144 mmol/L 142 137 139  Potassium 3.5 - 5.2 mmol/L 5.5(H) 5.2(H) 6.3(HH)  Chloride 96 - 106 mmol/L 105 104 103  CO2 20 - 29 mmol/L _0 Calcium 8.6 - 10.2 mg/dL 10.0 9.4 10.1     Hepatic Function Latest Ref Rng & Units 06/29/2020 11/07/2019 12/09/2011  Total Protein 6.0 - 8.3 g/dL 7.4 7.3 7.1  Albumin 3.5 - 5.2 g/dL 4.3 4.4 3.5  AST 0 - 37 U/L _1 ALT 0 - 53 U/L _2 Alk Phosphatase 39 - 117 U/L 67 71 67  Total Bilirubin 0.2 - 1.2 mg/dL 0.4 0.4 0.7  Bilirubin, Direct 0.0 - 0.3 mg/dL 0.1 0.1 -    CBC Latest Ref Rng & Units 07/25/2020 08/02/2019 11/10/2018  WBC 3.4 - 10.8 x10E3/uL 7.9 9.2 8.2  Hemoglobin 13.0 - 17.7 g/dL 14.3 13.4 13.0  Hematocrit 37.5 - 51.0 % 42.3 40.7 39.7  Platelets 150 - 450 x10E3/uL 195 188 182   Lab Results  Component Value Date   MCV 90 07/25/2020   MCV 92.7 08/02/2019   MCV 94.3 11/10/2018   Lab Results  Component Value Date   TSH 0.174 (L) 09/13/2016   No results found for: HGBA1C   BNP No results found for: BNP  ProBNP    Component Value Date/Time   PROBNP 557 (H) 04/02/2017 1204     Lipid Panel  No results found for: CHOL, TRIG,  HDL, CHOLHDL, VLDL, LDLCALC, LDLDIRECT, LABVLDL   RADIOLOGY: DG Chest 2 View  Result Date: 08/09/2020 CLINICAL DATA:  Post pacemaker placement EXAM: CHEST - 2 VIEW COMPARISON:  None. FINDINGS: The heart size and mediastinal contours are mildly enlarged. Aortic knob calcifications are seen. A left-sided pacemaker seen with the lead tips in the right atrium and right ventricle. Both lungs are clear. The visualized skeletal structures are unremarkable. IMPRESSION: No active cardiopulmonary disease. Left-sided pacemaker lead tips the right atrium and right ventricle. Electronically Signed   By: Prudencio Pair M.D.   On: 08/09/2020 16:59   EP PPM/ICD IMPLANT  Result Date: 08/09/2020 SURGEON:  [name], MD   PREPROCEDURE DIAGNOSES:  1. Sick sinus syndrome.  2. Complete heart block  3. Atrial lead malfunction with lead noise  4. Elective Replacement Indicator Status   POSTPROCEDURE DIAGNOSES:  1. Sick sinus syndrome.  2. Complete heart block  3. Atrial lead malfunction  with lead noise  4. Elective Replacement Indicator Status   PROCEDURES:  1. Pacemaker pulse generator replacement with new RA lead placement.  2.  Left upper extremity venography  3. Skin pocket revision.   INTRODUCTION:  ELAD MACPHAIL is a 80 y.o. male with a history of sick sinus syndrome and complete heart block. The patient has done well since his pacemaker was implanted.  The patient has recently reached ERI battery status.  Has has recently been found to have very frequent noise on his atrial lead with inhibition of pacing.  The patient presents today for pacemaker system revision with new RA lead placement and pulse generator replacement.     DESCRIPTION OF THE PROCEDURE:  Informed written consent was obtained, and the patient was brought to the electrophysiology lab in the fasting state.  The patient's pacemaker was interrogated today and found to be at elective replacement indicator battery status.  The patient was adequately sedated  with intravenous Versed as outlined in the nursing report.  The patient's left chest was prepped and draped in the usual sterile fashion by the EP lab staff.  The skin overlying the existing pacemaker was infiltrated with lidocaine for local analgesia.  A 4-cm incision was made over the pacemaker pocket.  Using a combination of sharp and blunt dissection, the pacemaker was exposed and removed from the body.  The device was disconnected from the leads. There was no foreign matter or debris within the pocket.  The previously placed atrial lead was confirmed to be a SJM [id number] (serial number [id number]) lead implanted on 12/09/2011.   This lead was abandoned today, capped, and returned to the pocket.   The right ventricular lead was confirmed to be a [place] [id number] (serial number [id number]) lead implanted on the same date as the atrial lead (above). Both leads were examined visual and fluoroscopically and their integrity was confirmed to be intact.  Due to frequent noise with inhibition of pacing, the atrial lead was abandoned today.  As above, this lead was capped and returned to the pocket.      Left Upper Extremity Venography: A venogram of the left upper extremity was performed, which revealed a large left axillary vein, which emptied into a large left subclavian vein.  The left cephalic vein was small in size.  RA Lead Placement: The left axillary vein was therefore cannulated with micropuncture technique.  Care was made to not damage the RV lead. Through the left axillary vein, a Medtronic [id number] (serial number [id number]) right atrial lead was advanced with fluoroscopic visualization into the right atrial appendage.  Initial atrial lead P- waves measured 2.7 mV with impedance of 527 ohms and a threshold of 1 V at 0.5 msec.  Right ventricular lead R-waves could not be measured today due to no escape rhythm.  The RV lead impedance was 590 ohms with a threshold of 0.6 V at 0.5 msec.  The new atrial  lead was secured to the pectoralis fascia using #2-0 silk over the suture sleeve. Both leads were connected to an [place] (serial number [id number]) pacemaker.  The pocket was revised to accommodate this new device.  Electrocautery was required to assure hemostasis.  The pocket was irrigated with copious gentamicin solution. The pacemaker was then placed into the pocket.  The pocket was then closed in 2 layers with 2-0 Vicryl suture over the subcutaneous and subcuticular layers.  Steri-Strips and a sterile dressing were then applied.EBL<69m. There  were no early apparent complications. Permanent Pacemaker Indication: Documented non-reversible symptomatic bradycardia due to second degree and/or third degree atrioventricular block.   CONCLUSIONS:  1. Successful pacemaker system revision with a new right atrial lead placed due to lead noise.  The previously placed SJM [id number] lead was capped and a new Medtronic [id number] lead was placed.  2. Ppulse generator replacement for elective replacement indicator battery status with an Abbott Assurity MRI dual chamber pacemaker  3. No early apparent complications.   [name], MD 08/09/2020 2:53 PM  CUP PACEART REMOTE DEVICE CHECK  Result Date: 07/16/2020 Scheduled monthly remote reviewed. Normal device function. Battery at ERI (triggered 07/09/20, managed as alert). Generator replacement being scheduled/pending.  AF burden <1% (1664 episodes), all EGM's c/w noise and oversensing with longest <31mn. Known issue. Lead trends stable. Ap 97%. Next remote 31 days- JBox, RN/CVRS    Additional studies/ records that were reviewed today include:   ECHO 1/12021  MPRESSIONS  1. Left ventricular ejection fraction, by visual estimation, is 50 to  55%. The left ventricle has normal function. There is no left ventricular  hypertrophy.  2. Elevated left atrial pressure.  3. Left ventricular diastolic parameters are consistent with Grade II  diastolic  dysfunction (pseudonormalization).  4. The left ventricle has no regional wall motion abnormalities.  5. Global right ventricle has normal systolic function.The right  ventricular size is mildly enlarged.  6. Left atrial size was normal.  7. Right atrial size was normal.  8. Mild mitral annular calcification.  9. The mitral valve is normal in structure. Mild mitral valve  regurgitation. No evidence of mitral stenosis.  10. The tricuspid valve is normal in structure.  11. The aortic valve is tricuspid. Aortic valve regurgitation is trivial.  Mild aortic valve sclerosis without stenosis.  12. The pulmonic valve was normal in structure. Pulmonic valve  regurgitation is trivial.  13. Moderately elevated pulmonary artery systolic pressure.  14. A pacer wire is visualized.  15. The inferior vena cava is normal in size with greater than 50%  respiratory variability, suggesting right atrial pressure of 3 mmHg.  16. Normal LV systolic function; grade 2 diastolic dysfunction; trace AI;  mild MR; mild RVE; moderate pulmonary hypertension.   In comparison to the previous echocardiogram(s): 09/17/17 EF 55-60%. [name]  pressure 451mg.      ASSESSMENT:    1. CAD in native artery   2. Essential hypertension   3. OSA on CPAP   4. Pacemaker   5. ILD (interstitial lung disease) ([place]  6. Controlled type 2 diabetes mellitus without complication, without long-term current use of insulin ([place]  7. Hypothyroidism, unspecified type     PLAN:  1. CAD: He is status post PCI with DES stenting to his left circumflex and OM1 vessel on August 12, 2019.  He continues to be without recurrent anginal symptomatology on aspirin, nebivolol 5 mg daily, pravastatin and telmisartan.  2.  Essential hypertension: Blood pressure is now stable with telmisartan 80 mg, nebivolol at increased dose of 5 mg.  He is no longer on lisinopril.  He is followed by Dr. SpDoy Hutchingor primary care.  3.  Dyspnea: At cardiac  catheterization in 2019 he had normal left and right filling pressures with mild pulmonary hypertension.  On echo Doppler study in January 2021 he had  grade 2 diastolic dysfunction, trace AI, mild MR, mild RVE and moderate pulmonary hypertension with estimated RV systolic pressure of 4862.1m.   He has been  diagnosed with interstitial lung disease, followed by Dr. Chase [name] and is now on Esbriet 3 tablets 3 times a day.  4.  Obstructive sleep apnea: He continues to use CPAP therapy with compliance.  I obtained a new download in the office today, AHI is excellent at 1.3 on his 7 cm water pressure.  There is no mask leak.  5.  Pacemaker: ECG shows AV paced rhythm.  Remote history of complete heart block.  He will undergo a pacemaker change out as well as new atrial lead placement by Cameron [name] tomorrow on August 09, 2020.  6.  Hyperlipidemia: He continues to be on pravastatin 20 mg daily.  Target LDL less than 70  7.  BPH:  followed by [facility] urology  8.  Type 2 diabetes mellitus: He continues to be glimepiride 2 mg and Metformin  9.  Hypothyroidism: On [place] Thyroid.  Followed by Dr. Doy Hutching  I will see him in 1 year for reevaluation.   Medication Adjustments/Labs and Tests Ordered: Current medicines are reviewed at length with the patient today.  Concerns regarding medicines are outlined above.  Medication changes, Labs and Tests ordered today are listed in the Patient Instructions below. Patient Instructions  Medication Instructions:  Continue current medications  *If you need a refill on your cardiac medications before your next appointment, please call your pharmacy*   Lab Work: None Ordered   Testing/Procedures: None ordered   Follow-Up: At [facility], you and your health needs are our priority.  As part of our continuing mission to provide you with exceptional heart care, we have created designated Provider Care Teams.  These Care Teams include your primary Cardiologist  (physician) and Advanced Practice Providers (APPs -  Physician Assistants and Nurse Practitioners) who all work together to provide you with the care you need, when you need it.  We recommend signing up for the patient portal called "MyChart".  Sign up information is provided on this After Visit Summary.  MyChart is used to connect with patients for Virtual Visits (Telemedicine).  Patients are able to view lab/test results, encounter notes, upcoming appointments, etc.  Non-urgent messages can be sent to your provider as well.   To learn more about what you can do with MyChart, go to [email or web address].    Your next appointment:   1 year(s)  The format for your next appointment:   In Person  Provider:   You may see Cameron Majestic, MD or one of the following Advanced Practice Providers on your designated Care Team:    Cameron Deforest, [name]-C  Fabian Sharp, [name]-C or   Roby Lofts, [place]       Signed, Cameron Majestic, MD  08/13/2020 4:02 PM    [facility] [address], [place], [place], [place]  [address] Phone: [phone]

## 2020-08-08 NOTE — Patient Instructions (Signed)
Medication Instructions:  Continue current medications  *If you need a refill on your cardiac medications before your next appointment, please call your pharmacy*   Lab Work: None Ordered   Testing/Procedures: None ordered   Follow-Up: At [facility], you and your health needs are our priority.  As part of our continuing mission to provide you with exceptional heart care, we have created designated Provider Care Teams.  These Care Teams include your primary Cardiologist (physician) and Advanced Practice Providers (APPs -  Physician Assistants and Nurse Practitioners) who all work together to provide you with the care you need, when you need it.  We recommend signing up for the patient portal called "MyChart".  Sign up information is provided on this After Visit Summary.  MyChart is used to connect with patients for Virtual Visits (Telemedicine).  Patients are able to view lab/test results, encounter notes, upcoming appointments, etc.  Non-urgent messages can be sent to your provider as well.   To learn more about what you can do with MyChart, go to [email or web address].    Your next appointment:   1 year(s)  The format for your next appointment:   In Person  Provider:   You may see [name], MD or one of the following Advanced Practice Providers on your designated Care Team:    [name], PA-C  Fabian Sharp, PA-C or   Roby Lofts, [place]

## 2020-08-09 ENCOUNTER — Ambulatory Visit (HOSPITAL_COMMUNITY): Payer: Medicare Other

## 2020-08-09 ENCOUNTER — Other Ambulatory Visit: Payer: Self-pay

## 2020-08-09 ENCOUNTER — Ambulatory Visit (HOSPITAL_COMMUNITY)
Admission: RE | Admit: 2020-08-09 | Discharge: 2020-08-09 | Disposition: A | Discharge status: Home / Self Care | Payer: Medicare Other | Source: Ambulatory Visit | Attending: Internal Medicine | Admitting: Internal Medicine

## 2020-08-09 ENCOUNTER — Ambulatory Visit (HOSPITAL_COMMUNITY)
Admission: RE | Disposition: A | Discharge status: Home / Self Care | Payer: Self-pay | Source: Ambulatory Visit | Attending: Internal Medicine

## 2020-08-09 DIAGNOSIS — Z45010 Encounter for checking and testing of cardiac pacemaker pulse generator [battery]: Secondary | ICD-10-CM | POA: Diagnosis present

## 2020-08-09 DIAGNOSIS — I442 Atrioventricular block, complete: Secondary | ICD-10-CM | POA: Diagnosis not present

## 2020-08-09 DIAGNOSIS — T82110A Breakdown (mechanical) of cardiac electrode, initial encounter: Secondary | ICD-10-CM | POA: Insufficient documentation

## 2020-08-09 DIAGNOSIS — Y849 Medical procedure, unspecified as the cause of abnormal reaction of the patient, or of later complication, without mention of misadventure at the time of the procedure: Secondary | ICD-10-CM | POA: Insufficient documentation

## 2020-08-09 DIAGNOSIS — I495 Sick sinus syndrome: Secondary | ICD-10-CM | POA: Insufficient documentation

## 2020-08-09 DIAGNOSIS — Z950 Presence of cardiac pacemaker: Secondary | ICD-10-CM

## 2020-08-09 DIAGNOSIS — Z959 Presence of cardiac and vascular implant and graft, unspecified: Secondary | ICD-10-CM

## 2020-08-09 HISTORY — PX: PPM GENERATOR CHANGEOUT: EP1233

## 2020-08-09 HISTORY — PX: LEAD REVISION/REPAIR: EP1213

## 2020-08-09 LAB — GLUCOSE, CAPILLARY: Glucose-Capillary: 118 mg/dL — ABNORMAL HIGH (ref 70–99)

## 2020-08-09 SURGERY — PPM GENERATOR CHANGEOUT

## 2020-08-09 MED ORDER — MUPIROCIN 2 % EX OINT
TOPICAL_OINTMENT | CUTANEOUS | Status: AC
  Filled 2020-08-09: qty 22

## 2020-08-09 MED ORDER — MIDAZOLAM HCL 5 MG/5ML IJ SOLN
INTRAMUSCULAR | Status: DC | PRN
  Administered 2020-08-09 (×3): 1 mg via INTRAVENOUS

## 2020-08-09 MED ORDER — CEFAZOLIN SODIUM-DEXTROSE 2-4 GM/100ML-% IV SOLN
INTRAVENOUS | Status: AC
  Filled 2020-08-09: qty 100

## 2020-08-09 MED ORDER — LIDOCAINE HCL (PF) 1 % IJ SOLN
INTRAMUSCULAR | Status: AC
  Filled 2020-08-09: qty 60

## 2020-08-09 MED ORDER — SODIUM CHLORIDE 0.9 % IV SOLN
INTRAVENOUS | Status: AC
  Filled 2020-08-09: qty 2

## 2020-08-09 MED ORDER — HEPARIN (PORCINE) IN NACL 1000-0.9 UT/500ML-% IV SOLN
INTRAVENOUS | Status: AC
  Filled 2020-08-09: qty 500

## 2020-08-09 MED ORDER — HYDROCODONE-ACETAMINOPHEN 5-325 MG PO TABS: 1.0000 | ORAL_TABLET | ORAL | Status: DC | PRN

## 2020-08-09 MED ORDER — LIDOCAINE HCL (PF) 1 % IJ SOLN
INTRAMUSCULAR | Status: DC | PRN
  Administered 2020-08-09: 60 mL

## 2020-08-09 MED ORDER — HEPARIN (PORCINE) IN NACL 1000-0.9 UT/500ML-% IV SOLN
INTRAVENOUS | Status: DC | PRN
  Administered 2020-08-09: 500 mL

## 2020-08-09 MED ORDER — FENTANYL CITRATE (PF) 100 MCG/2ML IJ SOLN
INTRAMUSCULAR | Status: AC
  Filled 2020-08-09: qty 2

## 2020-08-09 MED ORDER — SODIUM CHLORIDE 0.9 % IV SOLN: 250.0000 mL | INTRAVENOUS | Status: DC | PRN

## 2020-08-09 MED ORDER — CEFAZOLIN SODIUM-DEXTROSE 2-4 GM/100ML-% IV SOLN
2.0000 g | INTRAVENOUS | Status: AC
  Administered 2020-08-09: 2 g via INTRAVENOUS

## 2020-08-09 MED ORDER — ACETAMINOPHEN 325 MG PO TABS: 325.0000 mg | ORAL_TABLET | ORAL | Status: DC | PRN

## 2020-08-09 MED ORDER — MIDAZOLAM HCL 5 MG/5ML IJ SOLN
INTRAMUSCULAR | Status: AC
  Filled 2020-08-09: qty 5

## 2020-08-09 MED ORDER — SODIUM CHLORIDE 0.9 % IV SOLN: INTRAVENOUS | Status: DC

## 2020-08-09 MED ORDER — SODIUM CHLORIDE 0.9% FLUSH
3.0000 mL | INTRAVENOUS | Status: DC | PRN
Start: 2020-08-09 — End: 2020-08-09

## 2020-08-09 MED ORDER — ONDANSETRON HCL 4 MG/2ML IJ SOLN: 4.0000 mg | Freq: Four times a day (QID) | INTRAMUSCULAR | Status: DC | PRN

## 2020-08-09 MED ORDER — SODIUM CHLORIDE 0.9 % IV SOLN
80.0000 mg | INTRAVENOUS | Status: AC
  Administered 2020-08-09: 80 mg

## 2020-08-09 MED ORDER — POVIDONE-IODINE 10 % EX SWAB
2.0000 "application " | Freq: Once | CUTANEOUS | Status: AC
  Administered 2020-08-09: 2 via TOPICAL

## 2020-08-09 MED ORDER — SODIUM CHLORIDE 0.9% FLUSH: 3.0000 mL | Freq: Two times a day (BID) | INTRAVENOUS | Status: DC

## 2020-08-09 MED ORDER — CHLORHEXIDINE GLUCONATE 4 % EX LIQD: 4.0000 "application " | Freq: Once | CUTANEOUS | Status: DC

## 2020-08-09 SURGICAL SUPPLY — 7 items
CABLE SURGICAL S-101-97-12 (CABLE) ×2 IMPLANT
KIT MICROPUNCTURE NIT STIFF (SHEATH) ×1 IMPLANT
LEAD CAPSURE NOVUS 5076-52CM (Lead) ×1 IMPLANT
PACEMAKER ASSURITY DR-RF (Pacemaker) ×1 IMPLANT
PAD PRO RADIOLUCENT 2001M-C (PAD) ×2 IMPLANT
SHEATH 7FR PRELUDE SNAP 13 (SHEATH) ×1 IMPLANT
TRAY PACEMAKER INSERTION (PACKS) ×2 IMPLANT

## 2020-08-09 NOTE — Interval H&P Note (Signed)
History and Physical Interval Note:  08/09/2020 1:14 PM  Cameron [name]  has presented today for surgery, with the diagnosis of eri.  The various methods of treatment have been discussed with the patient and family. After consideration of risks, benefits and other options for treatment, the patient has consented to  Procedure(s): PPM GENERATOR CHANGEOUT (N/A) LEAD REVISION/REPAIR (N/A) as a surgical intervention.  The patient's history has been reviewed, patient examined, no change in status, stable for surgery.  I have reviewed the patient's chart and labs.  Questions were answered to the patient's satisfaction.    Risks, benefits, and alternatives to PPM pulse generator replacement with atrial lead replacement were discussed in detail today.  The patient understands that risks include but are not limited to bleeding, infection, pneumothorax, perforation, tamponade, vascular damage, renal failure, MI, stroke, death,  damage to his existing leads, and lead dislodgement and wishes to proceed.      Cameron [name]

## 2020-08-09 NOTE — Discharge Instructions (Signed)
Pacemaker Battery Change, Care After This sheet gives you information about how to care for yourself after your procedure. Your health care provider may also give you more specific instructions. If you have problems or questions, contact your health care provider. What can I expect after the procedure? After the procedure, it is common to have these symptoms at the site where the pacemaker was inserted:  Mild pain or soreness.  Slight bruising.  Some swelling over the incisions.  A slight bump over the skin where the device was placed (if it was implanted in the upper chest area). Sometimes, it is possible to feel the device under the skin. This is normal. Follow these instructions at home: Incision care  Keep the incision clean and dry for 2-3 days after the procedure or as told by your health care provider. It takes several weeks for the incision site to completely heal.  Do not remove the bandage (dressing) on your chest until told to do so by your health care provider.  Leave stitches (sutures), skin glue, or adhesive strips in place. These skin closures may need to stay in place for 2 weeks or longer. If adhesive strip edges start to loosen and curl up, you may trim the loose edges. Do not remove adhesive strips completely unless your health care provider tells you to do that.  Do not take baths, swim, or use a hot tub for 7-10 days or until your health care provider approves. Ask your health care provider if you may take showers. You may only be allowed to take sponge baths.  Pat the incision area dry with a clean towel. Do not rub the area. This may cause bleeding.  Check your incision area every day for signs of infection. Check for: ? More redness, swelling, or pain. ? Fluid or blood. ? Warmth. ? Pus or a bad smell.  Avoid putting pressure on the area where the pacemaker was placed. Women may want to place a small pad over the incision site to protect it from their bra strap.    Medicines  Take over-the-counter and prescription medicines only as told by your health care provider.  If you were prescribed an antibiotic medicine, take it as told by your health care provider. Do not stop taking the antibiotic even if you start to feel better. Activity  For the first 2 weeks, or as long as told by your health care provider: ? Avoid lifting your left arm higher than your shoulder. ? Be gentle when you move your arms over your head. It is okay to raise your arm to comb your hair. ? Avoid exercise or activities that take a lot of effort.  Ask your health care provider when it is okay to: ? Return to your normal activities. ? Return to work or school. ? Resume sexual activity.  If you were given a medicine to help you relax (sedative) during the procedure, it can affect you for several hours. Do not drive or operate machinery until your health care provider says that it is safe. General instructions  Do not use any products that contain nicotine or tobacco, such as cigarettes, e-cigarettes, and chewing tobacco. These can delay incision healing after surgery. If you need help quitting, ask your health care provider.  Always let all health care providers, including dentists, know about your pacemaker before you have any medical procedures or tests.  You may be shown how to transfer data from your pacemaker through the phone to your  health care provider.  Wear a medical ID bracelet or necklace stating that you have a pacemaker, and carry a pacemaker ID card with you at all times.  Avoid close and prolonged exposure to electrical devices that have strong magnetic fields. These include: ? Airport Data processing manager. When at the airport, let officials know that you have a pacemaker. Carry your pacemaker ID card. ? Metal detectors. If you must pass through a metal detector, walk through it quickly. Do not stop under the detector or stand near it.  When using your mobile  phone, hold it to the ear opposite the pacemaker. Do not leave your mobile phone in a pocket over the pacemaker.  Your pacemaker battery will last for 5-15 years. Your health care provider will do routine checks to know when the battery is starting to run down. When this happens, the pacemaker will need to be replaced.  Keep all follow-up visits as told by your health care provider. This is important. Contact a health care provider if:  You have pain at the incision site that is not relieved by medicines.  You have any of these signs of infection: ? More redness, swelling, or pain around your incision. ? Fluid or blood coming from your incision. ? Warmth coming from your incision. ? Pus or a bad smell coming from your incision. ? A fever.  You feel brief, occasional palpitations, light-headedness, or any symptoms that you think might be related to your heart. Get help right away if:  You have chest pain that is different from the pain at the pacemaker site.  You develop a red streak that extends above or below the incision site.  You have shortness of breath.  You have palpitations or an irregular heartbeat.  You have light-headedness that does not go away quickly.  You faint or have dizzy spells.  Your pulse suddenly drops or increases rapidly and does not return to normal.  You gain weight and your legs and ankles swell. Summary  After the procedure, it is common to have pain, soreness, and some swelling or bruising where the pacemaker was inserted.  Keep your incision clean and dry. Follow instructions from your health care provider about how to take care of your incision.  Check your incision every day for signs of infection, such as more pain or swelling, pus or a bad smell, warmth, or leaking fluid or blood.  Carry a pacemaker ID card with you at all times. This information is not intended to replace advice given to you by your health care provider. Make sure you  discuss any questions you have with your health care provider. Document Revised: 04/14/2019 Document Reviewed: 04/14/2019 Elsevier Patient Education  2021 [place] Discharge Instructions for  Pacemaker/Defibrillator Patients  Tomorrow, 08/10/20, send in a device transmission  Activity No heavy lifting or vigorous activity with your left arm for 6 to 8 weeks.  Do not raise your left arm above your head for one week.  Gradually raise your affected arm as drawn below.              08/14/20                    08/15/20                    08/16/20                   08/17/20  __  NO DRIVING until cleared to at your wound check visit  WOUND CARE - Keep the wound area clean and dry.  Do not get this area wet , no showers until cleared to at your wound check visit . - Tomorrow, 08/10/20, remove the arm sling - Tomorrow, 08/10/20 remove the large outer plastic bandage.  Underneath the plastic bandage there are steri strips (paper tapes), DO NOT remove these. - The tape/steri-strips on your wound will fall off; do not pull them off.  No bandage is needed on the site.  DO  NOT apply any creams, oils, or ointments to the wound area. - If you notice any drainage or discharge from the wound, any swelling or bruising at the site, or you develop a fever > 101? F after you are discharged home, call the office at once.  Special Instructions - You are still able to use cellular telephones; use the ear opposite the side where you have your pacemaker/defibrillator.  Avoid carrying your cellular phone near your device. - When traveling through airports, show security personnel your identification card to avoid being screened in the metal detectors.  Ask the security personnel to use the hand wand. - Avoid arc welding equipment, MRI testing (magnetic resonance imaging), TENS units (transcutaneous nerve stimulators).  Call the office for questions about other devices. - Avoid electrical  appliances that are in poor condition or are not properly grounded. - Microwave ovens are safe to be near or to operate.

## 2020-08-09 NOTE — Progress Notes (Signed)
Dr [name] in and client advised to take b/p med when gets home and voiced understanding

## 2020-08-10 ENCOUNTER — Telehealth: Payer: Self-pay

## 2020-08-10 ENCOUNTER — Encounter (HOSPITAL_COMMUNITY): Payer: Self-pay | Admitting: Internal Medicine

## 2020-08-10 MED FILL — Fentanyl Citrate Preservative Free (PF) Inj 100 MCG/2ML: INTRAMUSCULAR | Qty: 2 | Status: AC

## 2020-08-10 NOTE — Telephone Encounter (Signed)
-----  Message from [place], [place] sent at 08/09/2020  3:11 PM EDT ----- Same day discharged SJM  Gen change and A lead revision  JA  Thanks renee

## 2020-08-10 NOTE — Telephone Encounter (Signed)
Pt returned phone call.  He reports minimal pain and overall doing well.  He has not yet removed outer dressing.  Encouraged pt to have outer dressing removed today.  Educated on S/S of infection to monitor for and report.  Reviewed activity limitations.

## 2020-08-10 NOTE — Telephone Encounter (Signed)
Attempted to reach pt for f/upost same day discharge.  No answer, left message on both home phone and pVM for pt wife Vickie with device clinic # to return call.    Follow-up after same day discharge: Implant date: 08/09/20 MD: [name], MD Device: SJM CRT-P Location: Left chest   Wound check visit: 08/21/20 90 day MD follow-up: 11/14/20  Remote Transmission received:Not yet, device not updated in [place].  Per [facility], it will cycle through tonight and update now that t has been updated in [employer].    Dressing removed: TBD

## 2020-08-13 ENCOUNTER — Encounter: Payer: Self-pay | Admitting: Cardiovascular Disease

## 2020-08-14 ENCOUNTER — Encounter (HOSPITAL_COMMUNITY): Payer: Self-pay | Admitting: Internal Medicine

## 2020-08-15 ENCOUNTER — Other Ambulatory Visit: Payer: Self-pay | Admitting: *Deleted

## 2020-08-15 NOTE — Telephone Encounter (Signed)
Please see patient comment.

## 2020-08-21 ENCOUNTER — Ambulatory Visit (INDEPENDENT_AMBULATORY_CARE_PROVIDER_SITE_OTHER): Payer: Medicare Other | Admitting: Emergency Medicine

## 2020-08-21 ENCOUNTER — Other Ambulatory Visit: Payer: Self-pay

## 2020-08-21 DIAGNOSIS — I442 Atrioventricular block, complete: Secondary | ICD-10-CM | POA: Diagnosis not present

## 2020-08-21 LAB — CUP PACEART INCLINIC DEVICE CHECK
Battery Remaining Longevity: 68 mo
Battery Voltage: 3.07 V
Brady Statistic RA Percent Paced: 99 %
Brady Statistic RV Percent Paced: 99.97 %
Date Time Interrogation Session: 20220329142245
Implantable Lead Implant Date: 20130716
Implantable Lead Implant Date: 20220317
Implantable Lead Location: 753859
Implantable Lead Location: 753860
Implantable Lead Model: 1948
Implantable Lead Model: 5076
Implantable Pulse Generator Implant Date: 20220317
Lead Channel Impedance Value: 387.5 Ohm
Lead Channel Impedance Value: 600 Ohm
Lead Channel Pacing Threshold Amplitude: 0.5 V
Lead Channel Pacing Threshold Amplitude: 0.5 V
Lead Channel Pacing Threshold Pulse Width: 0.5 ms
Lead Channel Pacing Threshold Pulse Width: 0.5 ms
Lead Channel Sensing Intrinsic Amplitude: 2.1 mV
Lead Channel Setting Pacing Amplitude: 0.75 V
Lead Channel Setting Pacing Amplitude: 3.5 V
Lead Channel Setting Pacing Pulse Width: 0.5 ms
Lead Channel Setting Sensing Sensitivity: 7 mV
Pulse Gen Model: 2272
Pulse Gen Serial Number: 3908614

## 2020-08-21 NOTE — Progress Notes (Signed)
Wound check appointment. Steri-strips removed. Wound without redness or edema. Incision edges approximated, wound well healed. Normal device function. Thresholds, sensing, and impedances consistent with implant measurements. Device programmed with RV lead at chronic settings due to mature lead and RA at 3.5V/auto capture programmed on for extra safety margin until 3 month visit. Histogram distribution appropriate for patient and level of activity. No mode switches or high ventricular rates noted. Patient educated about wound care, arm mobility, lifting restrictions. ROV with Dr [name] 11/14/20. Enrolled in remote folow-up and next remote 11/08/20.

## 2020-08-21 NOTE — Patient Instructions (Signed)
0

## 2020-09-20 ENCOUNTER — Telehealth: Payer: Self-pay | Admitting: Internal Medicine

## 2020-09-20 NOTE — Telephone Encounter (Signed)
PPM gen change 3/17 Wife and pt report small amt of clear red blood, when pressed down on, coming from the bottom part of incision site. They are currently in [place] and will not return until 5/2. Unable to send a pic of site via mychart. Aware I will send to device clinic to follow up with pt/wife and schedule appt to check wound site. They are agreeable to plan

## 2020-09-20 NOTE — Telephone Encounter (Signed)
Patient's wife called and mentioned that at the incision spot where the pacemaker was put it, it is blood still coming from that spot. Would like for [name] or nurse to give them a call to see if everything is ok.

## 2020-09-20 NOTE — Telephone Encounter (Signed)
Successful telephone call to patient to follow up on complaint of drainage from PPM site. Spoke with wife who is with patient on vacation in [place]. Will not return until 5/2. Wife states "tiny hole at bottom of incision draining blood". Noticed drainage yesterday on patients shirt. Cameron [name] is advised to take patient to local urgent care for evaluation as soon as possible. Denies fever, chills, redness, tender, or warmth at site. Advised wife to instruct patient to NOT swim or get in hot tub. Provided wife secure email address to send picture of wound for patient's chart. Will upload upon receipt. Wife provided device clinic number and will call back once patient has been seen by medical provider.

## 2020-09-21 NOTE — Telephone Encounter (Signed)
Incoming secure email from [employer], wife of patient Cameron [name]. [place], providing information regarding patients visit to medical clinic in [place] for PPM incisional drainage. Patient placed on doxycicline and encouraged to see provider when return [employer]. Patient scheduled for wound check 09/27/20 at 4:00 pm and notified.

## 2020-09-27 ENCOUNTER — Ambulatory Visit (INDEPENDENT_AMBULATORY_CARE_PROVIDER_SITE_OTHER): Payer: Medicare Other | Admitting: Emergency Medicine

## 2020-09-27 ENCOUNTER — Other Ambulatory Visit: Payer: Self-pay

## 2020-09-27 DIAGNOSIS — I442 Atrioventricular block, complete: Secondary | ICD-10-CM

## 2020-09-27 NOTE — Progress Notes (Signed)
Patient seen in device clinic today for wound recheck. Patient reports he has almost completed round of doxycycline. Denies any further drainage to site. Denies fever or chills. Dr. Lovena Le in to assess and reports no further treatment at this time. Patient has follow up with [name] 11/14/20. Patient aware of apt. Advised to call with further questions or concerns.

## 2020-09-27 NOTE — Patient Instructions (Signed)
Please call with any further questions or concerns.  [facility] [phone]

## 2020-10-01 ENCOUNTER — Encounter: Payer: Medicare Other | Admitting: *Deleted

## 2020-10-01 DIAGNOSIS — Z006 Encounter for examination for normal comparison and control in clinical research program: Secondary | ICD-10-CM

## 2020-10-01 DIAGNOSIS — J849 Interstitial pulmonary disease, unspecified: Secondary | ICD-10-CM

## 2020-10-01 NOTE — Research (Signed)
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 Describe the natural history of chronic fibrosing ILDs with progressive phenotype  . Assess quality of life from self-administered participant reported questionnaires for each disease group  . Describe participant interactions with the healthcare system, describe treatment practices across multiple institutions for each disease group  . Collect biological samples linked to well characterized chronic fibrosing ILDs with progressive phenotype to identify disease biomarkers  . Collect data and biological samples that will support future research studies.                                            Key Inclusion Criteria: Willing and able to provide informed consent  Age ? 30 years  Diagnosis of a non-IPF ILD of any duration, including, but not limited to Idiopathic Non-Specific Interstitial Pneumonia (INSIP), Unclassifiable Idiopathic Interstitial Pneumonias (IIPs), Interstitial Pneumonia with Autoimmune Features (IPAF), Autoimmune ILDs such as Rheumatoid Arthritis (RA-ILD) and Systemic Sclerosis (SSC-ILD), Chronic Hypersensitivity Pneumonitis (HP), Sarcoidosis or Exposure-related ILDs such as asbestosis.  Chronic fibrosing ILD defined by reticular abnormality with traction bronchiectasis with or without honeycombing confirmed by chest HRCT scan and/or lung biopsy.  Progressive phenotype as defined by fulfilling at least one of the criteria below of fibrotic  changes (progression set point) within the last 24 months regardless of treatment considered appropriate in individual ILDs:  . decline in FVC % predicted (% pred) based on >10% relative decline  . decline in FVC % pred based on ? 5 - <10% relative decline in FVC combined with worsening of respiratory symptoms as assessed by the site investigator  . decline in FVC % pred based on ? 5 - <10% relative decline in FVC combined with increasing extent of fibrotic changes on chest imaging (HRCT scan) as assessed by the site investigator  . decline in DLCO % pred based on ? 10% relative decline  . worsening of respiratory symptoms as well as increasing extent of fibrotic changes on chest imaging (HRCT scan) as assessed by the site investigator independent of FVC change.    Key Exclusion Criteria: Malignancy, treated or untreated, other than skin or early stage prostate cancer, within the past 5 years  Currently listed for lung transplantation at the time of enrollment  Currently enrolled in a clinical trial at the time of enrollment in this registry     Clinical Research Coordinator / Research RN note : This visit for Subject Cameron [name] with DOB: Jun 06, 1940 on 10/01/2020 for the above protocol is Visit/Encounter #2 and is for purpose of research.   The consent for this encounter is under Protocol Version Amendment 4 (Version Date: 04 February 2018), Consent [employer] Version 29 Apr 2018 Revised 29 Apr 2018 and is    currently IRB approved. Subject expressed continued interest and consent in continuing as a study subject. Subject confirmed that there was no change in contact information (e.g. address, telephone, email). Subject thanked for participation in research and  contribution to science.    During this visit on 10/01/20, The subject completed the blood work and questionnaires per the above referenced protocol. Please refer to the subject's paper source binder for further details.  Signed by  Lazaro Arms Clinical Research Coordinator [facility]  [place], [place] 2:39 PM 10/01/2020

## 2020-10-07 NOTE — Telephone Encounter (Signed)
MR please advise. Thanks  [name] would like for you to know that both hands for Cameron [name]== the knuckles are red and swelling with pain. We wanted to check with you to see what could be done or if it is a reaction to the medicine. Thanks Simone Curia [phone] cell

## 2020-10-11 ENCOUNTER — Telehealth: Payer: Self-pay | Admitting: Internal Medicine

## 2020-10-11 NOTE — Telephone Encounter (Signed)
Primary Pulmonologist:  Ramaswamy Last office visit and with whom: 06/29/2020  Ramaswamy What do we see them for (pulmonary problems): ILD Last OV assessment/plan:  Assessment:       ICD-10-CM   1. 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 steadiy progressive but stable since last visit May 2021 -There is some new research to suggest agent orange exposure is independently a risk factor for pulmonary fibrosis             -the government has not accepted this fact as ye  - You are in the ILD-pro registry study [I just confirmed]             - last visit Nov 2021  Plan -Do blood IgG for Covid -if there is no antibody response then will refer you to monoclonal antibody prophylaxis -Do liver function test today and again in 3 months  --Do spirometry and DLCO in 6 months  -Continue Esbriet per protocol [eat with food, space adequately between meals and apply sunscreen at all times]  -Continue daily exercise as before  -Email [employer] [email or web address] -who is a local support group leader and join the support group  Follow-up -Research team will contact you about the ILD-pro registry visit -Return to see [name] in a 30-minute slot in 6 months but after breathing test -Return sooner if needed       SIGNATURE    Dr. Brand Males, M.D., F.C.C.P,  Pulmonary and Critical Care Medicine Staff Physician, [place] Director - Interstitial Lung Disease  Program  Pulmonary [facility] [place], [place], [address]  Pager: [phone], If no answer or between  15:00h - 7:00h: call [phone]   [phone] Telephone: [phone]  2:07 PM 06/29/2020       Patient Instructions by Brand Males, MD at 06/29/2020 1:30 PM  Author: Brand Males, MD Author Type: Physician Filed: 06/29/2020 2:04 PM  Note Status: Addendum Cosign: Cosign Not Required Encounter Date: 06/29/2020  Editor: Brand Males, MD (Physician)      Prior Versions: 1. 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 so slowly and steadiy progressive but stable since last visit May 2021 -There is some new research to suggest agent orange exposure is independently a risk factor for pulmonary fibrosis             -the government has not accepted this fact as ye  - You are in the ILD-pro registry study [I just confirmed]             - last visit Nov 2021  Plan -Do blood IgG for Covid -if there is no antibody response then will refer you to monoclonal antibody prophylaxis -Do liver function test today and again in 3 months  --Do spirometry and DLCO in 6 months  -Continue Esbriet per protocol [eat with food, space adequately between meals and apply sunscreen at all times]  -Continue daily exercise as before  -  Email Marlane Mingle [email or web address] -who is a local support group leader and join the support group  Follow-up -Research team will contact you about the ILD-pro registry visit -Return to see 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 and steadiy progressive  but stable since last visit May 2021 -There is some new research to suggest agent orange exposure is independently a risk factor for pulmonary fibrosis             -the government has not accepted this fact as ye  - You are in the ILD-pro registry study [I just confirmed]             - last visit Nov 2021  Plan -Do blood IgG for Covid -if there is no antibody response then will refer you to monoclonal antibody prophylaxis -Do liver function test today and again in 3 months  --Do spirometry and DLCO in 6 months  -Continue Esbriet per protocol [eat with food, space adequately between meals and apply sunscreen at all times]  -Continue daily exercise as before  -Email [employer] [email or web address] -who is a local support group leader and join the support group  Follow-up -Research team will contact you about the ILD-pro registry visit -Return to see [name] in a 30-minute slot in 6 months but after breathing test -Return sooner if needed       Reason for call: Cough, congestion in head and chest for 3-4 days.  No mucous.  Tightness in chest.  Increased sob.  Last week hands started swelling, had blisters and painful.  Patient advised to contact rheumatologist regarding the joint pain and swelling.  He states he was told to stop the Omega-3 fatty acids (fish oil) and this helped control his RA.  Denies fever, chills or body aches.  Denies any sick contacts.  He is on the esbriet 801 mg 3 times per day.  He is on no oxygen and has been prescribed no inhalers/nebulizers.  [name], please advise.  Thank you.  (examples of things to ask: : When did symptoms start? Fever? Cough? Productive? Color to sputum? More sputum than usual? Wheezing? Have you needed increased oxygen? Are you taking your respiratory medications? What over the counter measures have you tried?)  Allergies  Allergen Reactions  . Ace Inhibitors Swelling    Tongue swelling, angioedema  . Celecoxib Rash     Skin rash     Immunization History  Administered Date(s) Administered  . Fluad Quad(high Dose 65+) 02/11/2020  . Influenza, High Dose Seasonal PF 03/26/2017  . Influenza-Unspecified 05/03/2015, 02/13/2016, 03/26/2016, 03/19/2017, 03/03/2018, 03/17/2019  . PFIZER(Purple Top)SARS-COV-2 Vaccination 06/11/2019, 07/04/2019, 07/18/2019, 03/01/2020  . Pneumococcal Polysaccharide-23 12/10/2011, 01/30/2018  . Pneumococcal-Unspecified 03/26/2016, 02/04/2018  . Tdap 01/17/2016, 02/04/2018

## 2020-10-12 MED ORDER — PREDNISONE 10 MG (21) PO TBPK: ORAL_TABLET | Freq: Every day | ORAL | 0 refills | Status: DC

## 2020-10-12 MED ORDER — CEPHALEXIN 500 MG PO CAPS: 500.0000 mg | ORAL_CAPSULE | Freq: Three times a day (TID) | ORAL | 0 refills | Status: DC

## 2020-10-12 NOTE — Telephone Encounter (Signed)
Needs to get covid tested. Home antigent test > if positive call us for Paxlovid. If negative, go get PCR tested  Regardless of covid he is at risk for ILD flare up  1. Monitor pulse ox - if dropping below 88% go to ER 2. STart Take prednisone 40 mg daily x 2 days, then 48m daily x 2 days, then 1345mdaily x 2 days, then 45m245maily x 2 days and stop  3. cephalexin 500m6mree times daily x  5 days  4. Rest, hydrate   Allergies  Allergen Reactions  . Ace Inhibitors Swelling    Tongue swelling, angioedema  . Celecoxib Rash    Skin rash      Current Outpatient Medications:  .  acetaminophen (TYLENOL) 500 MG tablet, Take 500 mg by mouth every 6 (six) hours as needed for moderate pain., Disp: , Rfl:  .  aspirin 81 MG EC tablet, Take 81 mg by mouth daily., Disp: , Rfl:  .  betamethasone dipropionate (DIPROLENE) 0.05 % ointment, Apply 1 application topically daily., Disp: , Rfl:  .  clobetasol (OLUX) 0.05 % topical foam, APPLY TOPICALLY 2 TIMES DAILY, Disp: 100 g, Rfl: 1 .  ESBRIET 267 MG TABS, TAKE 3 TABLETS THREE TIMES A DAY WITH MEALS (MAINTENANCE DOSE, START AFTER COMPLETED STARTER DOSE) (Patient taking differently: Take 801 mg by mouth 3 (three) times daily with meals.), Disp: 270 tablet, Rfl: 11 .  gabapentin (NEURONTIN) 300 MG capsule, Take 300 mg by mouth at bedtime., Disp: , Rfl:  .  glimepiride (AMARYL) 2 MG tablet, Take 2 mg by mouth every morning., Disp: , Rfl:  .  leflunomide (ARAVA) 10 MG tablet, Take 10 mg by mouth daily., Disp: , Rfl:  .  metFORMIN (GLUCOPHAGE) 500 MG tablet, Take 500 mg by mouth daily., Disp: , Rfl:  .  mupirocin ointment (BACTROBAN) 2 %, Apply 1 application topically daily., Disp: 22 g, Rfl: 1 .  nebivolol (BYSTOLIC) 5 MG tablet, Take 5 mg by mouth daily. (Patient not taking: Reported on 08/21/2020), Disp: , Rfl:  .  Omega-3 Fatty Acids (OMEGA-3 FISH OIL PO), Take 1 capsule by mouth daily., Disp: , Rfl:  .  pravastatin (PRAVACHOL) 20 MG tablet, Take 1  tablet (20 mg total) by mouth every evening. *NEEDS OFFICE VISIT FOR FURTHER REFILLS* (Patient taking differently: Take 20 mg by mouth every evening.), Disp: 90 tablet, Rfl: 0 .  telmisartan (MICARDIS) 80 MG tablet, Take 80 mg by mouth daily., Disp: , Rfl:  .  thyroid (ARMOUR) 90 MG tablet, Take 90 mg by mouth every morning. , Disp: , Rfl:  .  trospium (SANCTURA) 20 MG tablet, Take 20 mg by mouth 2 (two) times daily., Disp: , Rfl:  .  TURMERIC PO, Take 1 capsule by mouth daily., Disp: , Rfl:  No current facility-administered medications for this visit.  Facility-Administered Medications Ordered in Other Visits:  .  ondansetron (ZOFRAN) 4 mg in sodium chloride 0.9 % 50 mL IVPB, 4 mg, Intravenous, Q6H PRN, CabbAshok Pall

## 2020-10-12 NOTE — Telephone Encounter (Signed)
I called and spoke with patient regarding MR recs. Patient took a home test yesterday and it was negative and I informed patient that MR would like him to go and get a PCR test and he stated he will. I have sent in the ABX and pred taper to preferred pharmacy. Patient verbalized understanding and will call us and update [place] with the PCR test. Nothing further needed.

## 2020-10-12 NOTE — Telephone Encounter (Signed)
The joint pain does NOT sound like fro m esbriet but instead related to his RA. He needs to contact his rheumatologist  Thanks  MR   has a past medical history of Appendicitis, Atrial fibrillation ([place]) (12/12/2013), BPH (benign prostatic hypertrophy), CAD (coronary artery disease) (12/2015), Chest pain (12/03/2015), CHF (congestive heart failure) ([place]), Complete heart block ([place]), Coronary artery disease, Coronary artery disease involving native coronary artery of native heart with unstable angina pectoris ([place]) (08/11/2017), History of blood transfusion (1968), History of kidney stones, History of SCC (squamous cell carcinoma) of skin (07/24/2020), Hyperglycemia (11/05/2013), Hyperlipidemia (11/05/2013), Hypertension, Hypothyroidism, Hypothyroidism, unspecified (11/05/2013), 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     Current Outpatient Medications:  .  acetaminophen (TYLENOL) 500 MG tablet, Take 500 mg by mouth every 6 (six) hours as needed for moderate pain., Disp: , Rfl:  .  aspirin 81 MG EC tablet, Take 81 mg by mouth daily., Disp: , Rfl:  .  betamethasone dipropionate (DIPROLENE) 0.05 % ointment, Apply 1 application topically daily., Disp: , Rfl:  .  clobetasol (OLUX) 0.05 % topical foam, APPLY TOPICALLY 2 TIMES DAILY, Disp: 100 g, Rfl: 1 .  ESBRIET 267 MG TABS, TAKE 3 TABLETS THREE TIMES A DAY WITH MEALS (MAINTENANCE DOSE, START AFTER COMPLETED STARTER DOSE) (Patient taking differently: Take 801 mg by mouth 3 (three) times daily with meals.), Disp: 270 tablet, Rfl: 11 .  gabapentin (NEURONTIN) 300 MG capsule, Take 300 mg by mouth at bedtime., Disp: , Rfl:  .  glimepiride (AMARYL) 2 MG tablet, Take 2 mg by mouth every morning., Disp: , Rfl:  .  leflunomide (ARAVA) 10 MG  tablet, Take 10 mg by mouth daily., Disp: , Rfl:  .  metFORMIN (GLUCOPHAGE) 500 MG tablet, Take 500 mg by mouth daily., Disp: , Rfl:  .  mupirocin ointment (BACTROBAN) 2 %, Apply 1 application topically daily., Disp: 22 g, Rfl: 1 .  nebivolol (BYSTOLIC) 5 MG tablet, Take 5 mg by mouth daily. (Patient not taking: Reported on 08/21/2020), Disp: , Rfl:  .  Omega-3 Fatty Acids (OMEGA-3 FISH OIL PO), Take 1 capsule by mouth daily., Disp: , Rfl:  .  pravastatin (PRAVACHOL) 20 MG tablet, Take 1 tablet (20 mg total) by mouth every evening. *NEEDS OFFICE VISIT FOR FURTHER REFILLS* (Patient taking differently: Take 20 mg by mouth every evening.), Disp: 90 tablet, Rfl: 0 .  telmisartan (MICARDIS) 80 MG tablet, Take 80 mg by mouth daily., Disp: , Rfl:  .  thyroid (ARMOUR) 90 MG tablet, Take 90 mg by mouth every morning. , Disp: , Rfl:  .  trospium (SANCTURA) 20 MG tablet, Take 20 mg by mouth 2 (two) times daily., Disp: , Rfl:  .  TURMERIC PO, Take 1 capsule by mouth daily., Disp: , Rfl:  No current facility-administered medications for this visit.  Facility-Administered Medications Ordered in Other Visits:  .  ondansetron (ZOFRAN) 4 mg in sodium chloride 0.9 % 50 mL IVPB, 4 mg, Intravenous, Q6H PRN, Ashok Pall, MD

## 2020-10-15 ENCOUNTER — Telehealth: Payer: Self-pay | Admitting: Cardiovascular Disease

## 2020-10-15 DIAGNOSIS — U071 COVID-19: Secondary | ICD-10-CM

## 2020-10-15 NOTE — Telephone Encounter (Signed)
[name] is to see Dr Adonis Brook next week.   We have come in contact with a person that tested positive for covid last thurs. As of now he feel ok but still has a cough and has head cold. Was tested this am at [facility]. SARS-Cov 2 antigen was positive also tested for SARS-Covid2 By RT-PCR this is pending. O2 level at Dr Doy Hutching office was 95 and BP was little high 186/76 Just wanted you to know  Whats next? Thanks Simone Curia   [name], please advise. Thanks!

## 2020-10-15 NOTE — Telephone Encounter (Signed)
Spoke to the patient's wife and they are interested in referral to covid treatment. Gathered all needed information for referral. Verbalized understanding and agreement.

## 2020-10-15 NOTE — Telephone Encounter (Signed)
Called patient and spoke to wife and patient, she just wanted to make Dr.Kelly aware. She just wants to make sure he should not be doing anything different. I advised just to monitor for any symptoms and to monitor BP/HR. She will do this. She also mentioned that Dr.Allred said something about having the infusions but not sure if he qualified I advised I would route a message over to [facility] to see if he thought he would and if so we could help coordinate this.  She was thankful for call back.

## 2020-10-15 NOTE — Telephone Encounter (Signed)
His last renal function showed GFR 57. SO lower dose paxlovid indicated. This is  Paxlovid (nirmatelvir 150/ritonavir 100) - BID x 5 days - for GFR 30-60 (please note this is the lower dose. Full dose is nirmatelvir 300/ritonavir 100). The dosing is similr in concept to breo or symbicort    If Cameron [name] is on any of the following Strong CYP3A inhibtors - he should wtithold. Please check med list and ensure if he Cameron [name] is taking any of these:  alfuzosin, amiodarone, clozapine, colchicine, dihydroergotamine, dronedarone, ergotamine, flecainide, lovastatin, lurasidone, methylergonovine, midazolam [oral], pethidine, pimozide, propafenone, propoxyphene, quinidine, ranolazine, sildenafil simvastatin, triazolam). PLEASE CHECK MED LIST FOR THESE  If Cameron [name]  Is on any of these other strong CYP3A inducers apalutamide, carbamazepine, phenobarbital, phenytoin, rifampin, St John's wort) - PLS CHECK MED LIST. LMK  immediately and we should delay starting paxlovid by some days even if he stops these medication  Side effects - all < 5%  - skin rash (and veyr rare a conditon called TEN) - angiomedia  - myalgia - jaundice - high bP (1%) - loss of taste  - diarrhea  CrCl cannot be calculated (Patient's most recent lab result is older than the maximum 21 days allowed.).   Immunization History  Administered Date(s) Administered  . Fluad Quad(high Dose 65+) 02/11/2020  . Influenza, High Dose Seasonal PF 03/26/2017  . Influenza-Unspecified 05/03/2015, 02/13/2016, 03/26/2016, 03/19/2017, 03/03/2018, 03/17/2019  . PFIZER(Purple Top)SARS-COV-2 Vaccination 06/11/2019, 07/04/2019, 07/18/2019, 03/01/2020  . Pneumococcal Polysaccharide-23 12/10/2011, 01/30/2018  . Pneumococcal-Unspecified 03/26/2016, 02/04/2018  . Tdap 01/17/2016, 02/04/2018   Results for Cameron [name], Cameron "[name]" (MRN [id number]) as of 10/15/2020 17:06  Ref. Range 07/25/2020 15:43  eGFR Latest Ref Range: >59 mL/min/1.73 57  (L)  Results for Cameron [name], Cameron "[name]" (MRN [id number]) as of 10/15/2020 17:06  Ref. Range 07/25/2020 15:43  Creatinine Latest Ref Range: 0.76 - 1.27 mg/dL 1.27

## 2020-10-15 NOTE — Telephone Encounter (Signed)
Patient's wife states the patient tested positive for covid on the SARS-Covid 2 antigen test. She states the results are still pending for SARA-Covid 2 RT- PCR and they may not hear back until tomorrow. She states he was just in to see his PCP Dr. Doy Hutching and his oxygen was at 95% and BP was 186/76. She states he is not have covid symptoms and actually had a chest cold last week, but feels better.

## 2020-10-16 ENCOUNTER — Telehealth: Payer: Self-pay | Admitting: Adult Health

## 2020-10-16 ENCOUNTER — Telehealth: Payer: Self-pay | Admitting: Internal Medicine

## 2020-10-16 MED ORDER — PAXLOVID 20 X 150 MG & 10 X 100MG PO TBPK: 1.0000 | ORAL_TABLET | Freq: Two times a day (BID) | ORAL | 0 refills | Status: DC

## 2020-10-16 NOTE — Telephone Encounter (Signed)
Prescription sent for the lower dose paxlovid.  He should take that it.  It is for COVID  Let us see how he is doing with the antiviral and then we can decide if we need to send another antibiotic or not

## 2020-10-16 NOTE — Telephone Encounter (Signed)
Not clear what the confusion is. He has imparired renal function. So  You should go for the lower dosing. Is still bid x 5 days per [facility]   PAXLOVID     Paxlovid (nirmatelvir 150/ritonavir 100) - BID x 5 days - for GFR 30-60. -> SEND THIS DOSE . Please note the dosing difference  I got this from [facility]

## 2020-10-16 NOTE — Telephone Encounter (Signed)
Spoke with the pt and notified of response per MR  He verbalized understanding  Nothing further needed

## 2020-10-16 NOTE — Telephone Encounter (Signed)
Called pt's pharmacy and spoke with Kennyth Lose letting her know that the Rx should be the lower dose and for pt to take it bid for 5 days. What the total amount for quantity should be is 20tabs due to there being 2 meds involved. Kennyth Lose said she was going to fix the quantity on the Rx that was sent. Nothing further needed.

## 2020-10-16 NOTE — Telephone Encounter (Signed)
Pharmacy is calling stating that the PAXLOVID that was sent in was sent in wrong.  This should either be 20 tablets or 30 tablets.  She stated that this was sent in for 10 days. Please advise on what the dose is that you would like. Thanks  This is what you said you wanted below:  His last renal function showed GFR 57. SO lower dose paxlovid indicated. This is  Paxlovid (nirmatelvir 150/ritonavir 100) - BID x 5 days - for GFR 30-60 (please note this is the lower dose. Full dose is nirmatelvir 300/ritonavir 100). The dosing is similr in concept to breo or symbicort    If GORDIE CRUMBY is on any of the following Strong CYP3A inhibtors - he should wtithold. Please check med list and ensure if he LANGLEY INGALLS is taking any of these:  alfuzosin, amiodarone, clozapine, colchicine, dihydroergotamine, dronedarone, ergotamine, flecainide, lovastatin, lurasidone, methylergonovine, midazolam [oral], pethidine, pimozide, propafenone, propoxyphene, quinidine, ranolazine, sildenafil simvastatin, triazolam). PLEASE CHECK MED LIST FOR THESE  If [name]  Is on any of these other strong CYP3A inducers apalutamide, carbamazepine, phenobarbital, phenytoin, rifampin, St John's wort) - PLS CHECK MED LIST. LMK  immediately and we should delay starting paxlovid by some days even if he stops these medication  Side effects - all < 5%  - skin rash (and veyr rare a conditon called TEN) - angiomedia  - myalgia - jaundice - high bP (1%) - loss of taste  - diarrhea  CrCl cannot be calculated (Patient's most recent lab result is older than the maximum 21 days allowed.).

## 2020-10-16 NOTE — Telephone Encounter (Signed)
Called to discuss with patient about COVID-19 symptoms and the use of one of the available treatments for those with mild to moderate Covid symptoms and at a high risk of hospitalization.  Pt appears to qualify for outpatient treatment due to co-morbid conditions and/or a member of an at-risk group in accordance with the FDA Emergency Use Authorization.    Symptom onset: last week Vaccinated: yes Booster? yes Immunocompromised? no Qualifiers: age, CAD, diabetes, HTN, interstitial lung disease NIH Criteria: 1  Unable to reach pt - LMOM.    On chart review, it appears patient has no current symptoms.  Therapy is not indicated in asymptomatic patients.  Oral therapy is indicated for patients who have mild/moderate COVID are within 5 days of symptom onset, and IV therapy is indicated for patients who have mild/moderate COVID and are within 7 days of symptom onset. I asked him to call us back so we can get more details and decide if he is a candidate for treatment.     Scot Dock

## 2020-10-16 NOTE — Telephone Encounter (Signed)
Patient called in on 10/11/2020 and was prescribed abx and prednisone. He will complete prednisone on Friday and Keflex tomorrow.  He tested positive for covid on 10/15/2020. C/o prod cough with productive cough with green sputum, increased sob with exertion, head congestion and fatigue. Sx have been present for 1 week. Denied fever, chills or sweats.  He is requesting additional recommendations.  He does not have any inhalers or wear supplemental oxygen.  MR, please advise. Thanks

## 2020-10-17 NOTE — Telephone Encounter (Signed)
If he is currently asymptomatic he probably does not need the infusion therapy.  Would follow-up with his primary care provider

## 2020-10-18 NOTE — Telephone Encounter (Signed)
Called patient wife, LVM advising of message from MD. Advised to call back if questions.

## 2020-11-08 ENCOUNTER — Ambulatory Visit (INDEPENDENT_AMBULATORY_CARE_PROVIDER_SITE_OTHER): Payer: Medicare Other

## 2020-11-08 DIAGNOSIS — I442 Atrioventricular block, complete: Secondary | ICD-10-CM

## 2020-11-08 LAB — CUP PACEART REMOTE DEVICE CHECK
Date Time Interrogation Session: 20220616065334
Implantable Lead Implant Date: 20130716
Implantable Lead Implant Date: 20220317
Implantable Lead Location: 753859
Implantable Lead Location: 753860
Implantable Lead Model: 1948
Implantable Lead Model: 5076
Implantable Pulse Generator Implant Date: 20220317
Pulse Gen Model: 2272
Pulse Gen Serial Number: 3908614

## 2020-11-12 ENCOUNTER — Other Ambulatory Visit (HOSPITAL_BASED_OUTPATIENT_CLINIC_OR_DEPARTMENT_OTHER): Payer: Self-pay

## 2020-11-12 ENCOUNTER — Ambulatory Visit: Payer: Medicare Other

## 2020-11-14 ENCOUNTER — Encounter: Payer: Medicare Other | Admitting: Internal Medicine

## 2020-11-15 ENCOUNTER — Other Ambulatory Visit (HOSPITAL_BASED_OUTPATIENT_CLINIC_OR_DEPARTMENT_OTHER): Payer: Self-pay

## 2020-11-15 ENCOUNTER — Ambulatory Visit: Payer: Medicare Other | Attending: Internal Medicine

## 2020-11-15 DIAGNOSIS — Z23 Encounter for immunization: Secondary | ICD-10-CM

## 2020-11-15 MED ORDER — PFIZER-BIONT COVID-19 VAC-TRIS 30 MCG/0.3ML IM SUSP
INTRAMUSCULAR | 0 refills | Status: DC
  Filled 2020-11-15: qty 0.3, 1d supply, fill #0

## 2020-11-15 NOTE — Progress Notes (Signed)
   Covid-19 Vaccination Clinic  Name:  Cameron [name]    MRN: [id number] DOB: 12/15/1940  11/15/2020  [name] was observed post Covid-19 immunization for 15 minutes without incident. He was provided with Vaccine Information Sheet and instruction to access the V-Safe system.   [name] was instructed to call 911 with any severe reactions post vaccine: Difficulty breathing  Swelling of face and throat  A fast heartbeat  A bad rash all over body  Dizziness and weakness   Immunizations Administered     Name Date Dose VIS Date Route   PFIZER Comrnaty(Gray TOP) Covid-19 Vaccine 11/15/2020 12:40 PM 0.3 mL 05/03/2020 Intramuscular   Manufacturer: [place]   Lot: [id number]   [place]: [phone]

## 2020-11-29 NOTE — Progress Notes (Signed)
Remote pacemaker transmission.   

## 2020-12-05 ENCOUNTER — Telehealth: Payer: Self-pay

## 2020-12-05 NOTE — Telephone Encounter (Signed)
Returned patient's call regarding recent episode of AT/AF that began 12/03/20 at 0904. Assisted patient with sending manual transmission. AT/AF burden increased to 2.2%. Patient remains in AF with V rates controlled. No OAC. Patient compliant with ASA, plavix, nebivolol, telmisartan. C/O increased shortness of breath and lack of energy. Denies chest discomfort or dizziness. Patient educated on s/s of cardiac emergences and when to call 911. Will send to AF clinic.       Current EGM 12/05/20 1625

## 2020-12-05 NOTE — Telephone Encounter (Addendum)
[place] alert received for increased AF burden with current episode possible ongoing since 12/03/20 at 0904. V rate controlled with VP_0 . Unsuccessful telephone encounter to patient to assess for s/s and medication compliance. Hip[employer] compliant VM message left requesting call back to [phone].

## 2020-12-05 NOTE — Telephone Encounter (Signed)
Pt returning [place] phone call.

## 2020-12-06 NOTE — Telephone Encounter (Signed)
Appointment 7/15 in the Afib Clinic.

## 2020-12-07 ENCOUNTER — Other Ambulatory Visit: Payer: Self-pay

## 2020-12-07 ENCOUNTER — Ambulatory Visit (HOSPITAL_COMMUNITY)
Admission: RE | Admit: 2020-12-07 | Discharge: 2020-12-07 | Disposition: A | Discharge status: Home / Self Care | Payer: Medicare Other | Source: Ambulatory Visit | Attending: Physician Assistant | Admitting: Physician Assistant

## 2020-12-07 ENCOUNTER — Encounter (HOSPITAL_COMMUNITY): Payer: Self-pay | Admitting: Physician Assistant

## 2020-12-07 VITALS — BP 150/68 | HR 70 | Ht 67.0 in | Wt 193.4 lb

## 2020-12-07 DIAGNOSIS — Z8249 Family history of ischemic heart disease and other diseases of the circulatory system: Secondary | ICD-10-CM | POA: Insufficient documentation

## 2020-12-07 DIAGNOSIS — Z87891 Personal history of nicotine dependence: Secondary | ICD-10-CM | POA: Diagnosis not present

## 2020-12-07 DIAGNOSIS — Z6830 Body mass index (BMI) 30.0-30.9, adult: Secondary | ICD-10-CM | POA: Insufficient documentation

## 2020-12-07 DIAGNOSIS — G4733 Obstructive sleep apnea (adult) (pediatric): Secondary | ICD-10-CM | POA: Insufficient documentation

## 2020-12-07 DIAGNOSIS — D6869 Other thrombophilia: Secondary | ICD-10-CM | POA: Insufficient documentation

## 2020-12-07 DIAGNOSIS — Z713 Dietary counseling and surveillance: Secondary | ICD-10-CM | POA: Diagnosis not present

## 2020-12-07 DIAGNOSIS — E669 Obesity, unspecified: Secondary | ICD-10-CM | POA: Insufficient documentation

## 2020-12-07 DIAGNOSIS — I2510 Atherosclerotic heart disease of native coronary artery without angina pectoris: Secondary | ICD-10-CM | POA: Insufficient documentation

## 2020-12-07 DIAGNOSIS — I10 Essential (primary) hypertension: Secondary | ICD-10-CM | POA: Insufficient documentation

## 2020-12-07 DIAGNOSIS — I4819 Other persistent atrial fibrillation: Secondary | ICD-10-CM | POA: Insufficient documentation

## 2020-12-07 DIAGNOSIS — Z7901 Long term (current) use of anticoagulants: Secondary | ICD-10-CM | POA: Insufficient documentation

## 2020-12-07 MED ORDER — RIVAROXABAN 20 MG PO TABS: 20.0000 mg | ORAL_TABLET | Freq: Every day | ORAL | 3 refills | Status: DC

## 2020-12-07 NOTE — Progress Notes (Signed)
Primary Care Physician: [name], MD Primary Cardiologist: Dr Claiborne Billings Primary Electrophysiologist: Dr [name] Referring Physician: Device clinic   Cameron [name] is a 80 y.o. male with a history of CHB s/p PPM, HTN, CAD, OSA, DM, ILD, atrial fibrillation who presents for consultation in the [facility].  The patient was initially diagnosed with atrial fibrillation 12/03/20 with the device clinic receiving an alert for an ongoing episode. Patient has a CHADS2VASC score of 5. He does report symptoms of increased fatigue. Of note, he was diagnosed with COVID 09/2020 and was also on prednisone for a foot issue. He denies significant alcohol use and is compliant with his CPAP.  Today, he denies symptoms of palpitations, chest pain, shortness of breath, orthopnea, PND, lower extremity edema, dizziness, presyncope, syncope, snoring, daytime somnolence, bleeding, or neurologic sequela. The patient is tolerating medications without difficulties and is otherwise without complaint today.    Atrial Fibrillation Risk Factors:  he does have symptoms or diagnosis of sleep apnea. he is compliant with CPAP therapy. he does not have a history of rheumatic fever. he does not have a history of alcohol use. The patient does not have a history of early familial atrial fibrillation or other arrhythmias.  he has a BMI of Body mass index is 30.29 kg/m.Marland Kitchen Filed Weights   12/07/20 1117  Weight: 87.7 kg    Family History  Problem Relation Age of Onset   Pancreatic cancer Mother    Bone cancer Father    Alzheimer's disease Sister    Heart attack Paternal Uncle    Arthritis Maternal Grandfather    Alzheimer's disease Paternal Grandmother    Lung cancer Paternal Grandfather      Atrial Fibrillation Management history:  Previous antiarrhythmic drugs: none Previous cardioversions: none Previous ablations: none CHADS2VASC score: 5 Anticoagulation history: none   Past  Medical History:  Diagnosis Date   Appendicitis    Atrial fibrillation ([place]) 12/12/2013   BPH (benign prostatic hypertrophy)    CAD (coronary artery disease) 12/2015   Cath by Dr [name] reveals distal and small vessel CAD.  Medical therapy advised.   Chest pain 12/03/2015   CHF (congestive heart failure) (HCC)    Complete heart block ([place])    s/p PPM   Coronary artery disease    Coronary artery disease involving native coronary artery of native heart with unstable angina pectoris ([place]) 08/11/2017   History of blood transfusion 1968   "probably; related [place]   History of kidney stones    History of SCC (squamous cell carcinoma) of skin 07/24/2020   right upper arm/excision   Hyperglycemia 11/05/2013   Hyperlipidemia 11/05/2013   Hypertension    Hypothyroidism    Hypothyroidism, unspecified 11/05/2013   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  AHI was 20/h while supine.  Oxygen saturations dropped to 87%.  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   Rheumatoid arthritis ([place])    "hands" (08/11/2017)   Type II diabetes mellitus ([place])    Past Surgical History:  Procedure Laterality Date   BACK SURGERY     CARDIAC CATHETERIZATION N/A 12/28/2015   Procedure: Left Heart Cath and Coronary Angiography;  Surgeon: Belva Crome, MD;  Location: [place];  Service: Cardiovascular;  Laterality: N/A;   CATARACT EXTRACTION W/ INTRAOCULAR LENS  IMPLANT, BILATERAL Bilateral    CORONARY ANGIOPLASTY WITH STENT PLACEMENT  08/11/2017   "2 stents"   CORONARY STENT INTERVENTION N/A 08/11/2017   Procedure: CORONARY STENT INTERVENTION;  Surgeon: Troy Sine, MD;  Location: [facility];  Service: Cardiovascular;  Laterality: N/A;   CYSTOSCOPY W/ STONE MANIPULATION      INGUINAL HERNIA REPAIR Left    INSERT / REPLACE / REMOVE PACEMAKER  12/09/2011   SJM Accent DR RF implanted by DR Allred for complete heart block and syncope   JOINT REPLACEMENT     LAPAROSCOPIC CHOLECYSTECTOMY     LEAD REVISION/REPAIR N/A 08/09/2020   Procedure: LEAD REVISION/REPAIR;  Surgeon: [name], MD;  Location: [facility];  Service: Cardiovascular;  Laterality: N/A;   LITHOTRIPSY     LUMBAR [place]     "removed arthritis and spurs"   PERMANENT PACEMAKER INSERTION N/A 12/09/2011   Procedure: PERMANENT PACEMAKER INSERTION;  Surgeon: [name], MD;  Location: [facility] CATH LAB;  Service: Cardiovascular;  Laterality: N/A;   PPM GENERATOR CHANGEOUT N/A 08/09/2020   Procedure: PPM GENERATOR CHANGEOUT;  Surgeon: [name], MD;  Location: [facility] [facility];  Service: Cardiovascular;  Laterality: N/A;   REPLACEMENT TOTAL KNEE Right    RIGHT/LEFT HEART CATH AND CORONARY ANGIOGRAPHY N/A 08/11/2017   Procedure: RIGHT/LEFT HEART CATH AND CORONARY ANGIOGRAPHY;  Surgeon: Larey Dresser, MD;  Location: [facility];  Service: Cardiovascular;  Laterality: N/A;   TRANSURETHRAL RESECTION OF PROSTATE  2017/2018    Current Outpatient Medications  Medication Sig Dispense Refill   acetaminophen (TYLENOL) 500 MG tablet Take 500 mg by mouth every 6 (six) hours as needed for moderate pain.     aspirin 81 MG EC tablet Take 81 mg by mouth daily.     clobetasol (OLUX) 0.05 % topical foam APPLY TOPICALLY 2 TIMES DAILY 100 g 1   clopidogrel (PLAVIX) 75 MG tablet Take 75 mg by mouth every morning.     COVID-19 mRNA Vac-TriS, Pfizer, (PFIZER-BIONT COVID-19 VAC-TRIS) SUSP injection Inject into the muscle. 0.3 mL 0   ESBRIET 267 MG TABS TAKE 3 TABLETS THREE TIMES A DAY WITH MEALS (MAINTENANCE DOSE, START AFTER COMPLETED STARTER DOSE) 270 tablet 11   gabapentin (NEURONTIN) 300 MG capsule Take 300 mg by mouth at bedtime.     glimepiride (AMARYL) 2 MG tablet Take 2 mg by mouth every morning.      leflunomide (ARAVA) 10 MG tablet Take 10 mg by mouth daily.     metFORMIN (GLUCOPHAGE) 500 MG tablet Take 500 mg by mouth daily.     nebivolol (BYSTOLIC) 5 MG tablet Take 5 mg by mouth daily.     nitroGLYCERIN (NITROSTAT) 0.3 MG SL tablet Place under the tongue.     Omega-3 Fatty Acids (OMEGA-3 FISH OIL PO) Take 1 capsule by mouth daily.     pravastatin (PRAVACHOL) 20 MG tablet Take 1 tablet (20 mg total) by mouth every evening. *NEEDS OFFICE VISIT FOR FURTHER REFILLS* 90 tablet 0   telmisartan (MICARDIS) 80 MG tablet Take 80 mg by mouth daily.     thyroid (ARMOUR) 90 MG tablet Take 90 mg by mouth every morning.      trospium (SANCTURA) 20 MG tablet Take 20 mg by mouth 2 (two) times daily.     TURMERIC PO Take 1 capsule by mouth daily.     No current facility-administered  medications for this encounter.   Facility-Administered Medications Ordered in Other Encounters  Medication Dose Route Frequency Provider Last Rate Last Admin   ondansetron (ZOFRAN) 4 mg in sodium chloride 0.9 % 50 mL IVPB  4 mg Intravenous Q6H PRN Ashok Pall, MD        Allergies  Allergen Reactions   Ace Inhibitors Swelling    Tongue swelling, angioedema   Lisinopril     Other reaction(s): Lip swelling, O/E - lip swelling   Celecoxib Rash    Skin rash     Social History   Socioeconomic History   Marital status: Married    Spouse name: Not on file   Number of children: Not on file   Years of education: Not on file   Highest education level: Not on file  Occupational History   Not on file  Tobacco Use   Smoking status: Former    Packs/day: 1.00    Years: 5.00    Pack years: 5.00    Types: Cigarettes    Quit date: 07/10/1972    Years since quitting: 48.4   Smokeless tobacco: Former    Types: Chew    Quit date: 1975  Vaping Use   Vaping Use: Never used  Substance and Sexual Activity   Alcohol use: Yes    Alcohol/week: 1.0 standard drink    Types: 1 Glasses of wine per week   Drug use: No    Sexual activity: Not Currently  Other Topics Concern   Not on file  Social History Narrative   Not on file   Social Determinants of Health   Financial Resource Strain: Not on file  Food Insecurity: Not on file  Transportation Needs: Not on file  Physical Activity: Not on file  Stress: Not on file  Social Connections: Not on file  Intimate Partner Violence: Not on file     ROS- All systems are reviewed and negative except as per the HPI above.  Physical Exam: Vitals:   12/07/20 1117  BP: (!) 150/68  Pulse: 70  Weight: 87.7 kg  Height: _0  (1.702 m)    GEN- The patient is a well appearing obese elderly male, alert and oriented x 3 today.   Head- normocephalic, atraumatic Eyes-  Sclera clear, conjunctiva pink Ears- hearing intact Oropharynx- clear Neck- supple  Lungs- Clear to ausculation bilaterally, normal work of breathing Heart- Regular rate and rhythm, no murmurs, rubs or gallops  GI- soft, NT, ND, + BS Extremities- no clubbing, cyanosis, or edema MS- no significant deformity or atrophy Skin- no rash or lesion Psych- euthymic mood, full affect Neuro- strength and sensation are intact  Wt Readings from Last 3 Encounters:  12/07/20 87.7 kg  08/09/20 86.6 kg  08/08/20 86.6 kg    EKG today demonstrates  V pacing with underlying coarse afib vs flutter Vent. rate 70 BPM PR interval * ms QRS duration 164 ms QT/QTcB 426/460 ms  Echo 06/07/19 demonstrated   1. Left ventricular ejection fraction, by visual estimation, is 50 to  55%. The left ventricle has normal function. There is no left ventricular  hypertrophy.   2. Elevated left atrial pressure.   3. Left ventricular diastolic parameters are consistent with Grade II  diastolic dysfunction (pseudonormalization).   4. The left ventricle has no regional wall motion abnormalities.   5. Global right ventricle has normal systolic function.The right  ventricular size is mildly enlarged.   6. Left atrial size  was normal.   7. Right  atrial size was normal.   8. Mild mitral annular calcification.   9. The mitral valve is normal in structure. Mild mitral valve  regurgitation. No evidence of mitral stenosis.  10. The tricuspid valve is normal in structure.  11. The aortic valve is tricuspid. Aortic valve regurgitation is trivial.  Mild aortic valve sclerosis without stenosis.  12. The pulmonic valve was normal in structure. Pulmonic valve  regurgitation is trivial.  13. Moderately elevated pulmonary artery systolic pressure.  14. A pacer wire is visualized.  15. The inferior vena cava is normal in size with greater than 50%  respiratory variability, suggesting right atrial pressure of 3 mmHg.  16. Normal LV systolic function; grade 2 diastolic dysfunction; trace AI;  mild MR; mild RVE; moderate pulmonary hypertension.   In comparison to the previous echocardiogram(s): 09/17/17 EF 55-60%. PA  pressure 75mHg.   Epic records are reviewed at length today  CHA2DS2-VASc Score = 5  The patient's score is based upon: CHF History: No HTN History: Yes Diabetes History: Yes Stroke History: No Vascular Disease History: Yes Age Score: 2 Gender Score: 0     ASSESSMENT AND PLAN: 1. Persistent atrial fibrillation  The patient's CHA2DS2-VASc score is 5, indicating a 7.2% annual risk of stroke.   General education about afib provided and questions answered. We also discussed his stroke risk and the risks and benefits of anticoagulation. Will stop Plavix and start Xarelto 20 mg daily Recent labs reviewed.  Continue Bystolic 5 mg daily Would consider DCCV after 3 weeks of uninterrupted anticoagulation.   2. Secondary Hypercoagulable State (ICD10:  D68.69) The patient is at significant risk for stroke/thromboembolism based upon his CHA2DS2-VASc Score of 5.  Start Rivaroxaban (Xarelto).   3. Obesity Body mass index is 30.29 kg/m. Lifestyle modification was discussed at length including regular  exercise and weight reduction.  4. Obstructive sleep apnea The importance of adequate treatment of sleep apnea was discussed today in order to improve our ability to maintain sinus rhythm long term. Patient reports compliance with CPAP therapy.  Followed by Dr KClaiborne Billings  5. HTN Stable, no changes today.  6. CAD No anginal symptoms.   Follow up with Dr ARayann Hemanas scheduled.    [facility] Hospital18355 Chapel StreetGAdena Ettrick [phone]/15/2022 11:30 AM

## 2020-12-07 NOTE — Patient Instructions (Signed)
Stop plavix  Start Xarelto 5m once a day WITH supper

## 2020-12-20 ENCOUNTER — Telehealth: Payer: Self-pay | Admitting: Internal Medicine

## 2020-12-20 ENCOUNTER — Telehealth: Payer: Self-pay

## 2020-12-20 ENCOUNTER — Encounter: Payer: Medicare Other | Admitting: Internal Medicine

## 2020-12-20 NOTE — Telephone Encounter (Signed)
The patient's wife stated that her husband talked to the PCP this morning. They told him he needed to see his cardiologist as soon as possible, that his heart had become much weaker since his last study. Cameron [name] that he needed to see his general cardiologist for this, Dr. Claiborne Billings. Also that we could not see the result in epic and they need to have it faxed over to Dr. Evette Georges office for review. Will send to Dr. Evette Georges nurse to help with scheduling.

## 2020-12-20 NOTE — Telephone Encounter (Signed)
Patient's wife the patient had an echo 7/21 and they received a call back from them saying he needs an appointment as soon as possible with [name]. She states he had the echo done in [place].

## 2020-12-21 ENCOUNTER — Telehealth: Payer: Self-pay | Admitting: Cardiovascular Disease

## 2020-12-21 NOTE — Telephone Encounter (Signed)
Submitted a faxed Prior Authorization request to [employer] for Cameron [name]. Will update once we receive a response.  Phone# [phone] Fax# [phone]

## 2020-12-21 NOTE — Telephone Encounter (Signed)
This RN called pt's wife Cameron [name] (ok per DPR), spoke to both pt's wife and pt. They reports that the pt had an echocardiogram done at [facility] on 7/21, and the recommendation from that physician was for the pt to be seen by cardiology "urgently". On echo, it is reported pt's EF is over 55%.   The patient also reports he is having symptoms - he reports he has felt weak and fatigued, he also reports he has been short of breath, although he admits this has been going on for about 3 months. He reports the shortness of breath occurs when moving. The patient denies swelling and denies weight gain of 3 pounds in one day or 5 pounds in 1 week. He denies chest pain but does endorse "twitches" in chest that are occasional and do not last long. Pt reports he has afib and has been taking his Xarelto as prescribed. His vitals at this time are HR of 82, O2 of 96%, BP of 150/81.   This RN made pt an appointment with Coletta Memos on 8/9 at 8:15am. This RN advised pt his concerns about his echo would be sent to Dr. Claiborne Billings to review, pt aware Dr. Claiborne Billings out of the office until next week. Pt advised he should call 911 or be taken to the emergency room should be develop chest pain, worsened shortness of breath, or any other concerning symptoms. Pt verbalized understanding.

## 2020-12-21 NOTE — Telephone Encounter (Signed)
Pt's wife is f/u to see if the results from pt's Echo have been viewed from 2 weeks ago from [facility] in [place]. Please advise pt further

## 2020-12-21 NOTE — Telephone Encounter (Signed)
Please see updated encounter

## 2020-12-25 NOTE — Telephone Encounter (Signed)
Received notification from [employer] regarding a prior authorization for ESBRIET. Authorization has been APPROVED from 12/21/2020 to 12/21/2021.   Authorization # [id number]   CID: [id number] Phone # [phone]

## 2020-12-30 NOTE — Progress Notes (Signed)
Cardiology Office Note:    Date:  01/01/2021   ID:  Cameron [name], DOB 1940-10-02, MRN [id number]  PCP:  Cameron Crouch, MD   [facility] Providers Cardiologist:  Cameron Majestic, MD Electrophysiologist:  Cameron Grayer, MD  Sleep Medicine:  Cameron Him, MD      Referring MD: Cameron Crouch, MD   Follow-up for atrial fibrillation  History of Present Illness:    Cameron [name] is a 80 y.o. male with a hx of persistent atrial fibrillation on Xarelto, complete heart block status post PPM, coronary artery disease, hypertension, obstructive sleep apnea on CPAP, diabetes, interstitial lung disease, obesity, and COVID-19 infection 5/22.  CHA2DS2-VASc score 5.  Recent echocardiogram 7/22 showed 55% LVEF.  He was seen and evaluated by Cameron So, PA on 12/07/2020.  During that time he reported he felt well.  He denied symptoms of palpitations, chest pain, shortness of breath, PND, orthopnea, and bleeding issues.  At that time his Plavix was stopped and he was started on Xarelto 20 mg daily.  His Bystolic was continued.  DCCV after 3 weeks of uninterrupted anticoagulation was recommended.  He was seen in the [place] clinic 11/2120.  It was recommended that he follow-up with cardiology for his atrial fibrillation urgently.  He reported that he had ongoing shortness of breath with increased physical activity for the last 3 months.  He denied weight gain, reported compliance with his Xarelto, heart rate 82, O2 sat 96, BP 150/81.  He presents the clinic today for follow-up evaluation states he feels well.  He has been exercising 3 days/week doing aerobic activity with 1 mile walks light weights.  He denies shortness of breath and palpitations.  He reports that he does not feel his atrial fibrillation or palpitations.  He does note that he does not feel as well when his heart rate is elevated.  His heart rate today is 60 and he reports that he feels very well.  He denies bleeding issues and  reports compliance with his Xarelto.  We reviewed the importance of being evaluated if trauma or MVA occur.  He and his wife expressed understanding.  We reviewed his blood pressure which has been elevated in the 979G-921J systolic at home.  I will start diltiazem 120 mg daily.  We will give the salty 6 diet sheet have [name] maintain his physical activity and follow-up with Dr. Claiborne Billings in 4 months.  I have also recommended that he maintain blood pressure log.  Today he denies chest pain, shortness of breath, lower extremity edema, fatigue, palpitations, melena, hematuria, hemoptysis, diaphoresis, weakness, presyncope, syncope, orthopnea, and PND.     Past Medical History:  Diagnosis Date   Appendicitis    Atrial fibrillation ([place]) 12/12/2013   BPH (benign prostatic hypertrophy)    CAD (coronary artery disease) 12/2015   Cath by Dr [name] reveals distal and small vessel CAD.  Medical therapy advised.   Chest pain 12/03/2015   CHF (congestive heart failure) (HCC)    Complete heart block ([place])    s/p PPM   Coronary artery disease    Coronary artery disease involving native coronary artery of native heart with unstable angina pectoris ([place]) 08/11/2017   History of blood transfusion 1968   "probably; related to getting [place]   History of kidney stones    History of SCC (squamous cell carcinoma) of skin 07/24/2020   right upper arm/excision   Hyperglycemia 11/05/2013   Hyperlipidemia 11/05/2013  Hypertension    Hypothyroidism    Hypothyroidism, unspecified 11/05/2013   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  AHI was 20/h while supine.  Oxygen saturations dropped to 87%.  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   Rheumatoid arthritis ([place])    "hands" (08/11/2017)   Type II diabetes  mellitus ([place])     Past Surgical History:  Procedure Laterality Date   BACK SURGERY     CARDIAC CATHETERIZATION N/A 12/28/2015   Procedure: Left Heart Cath and Coronary Angiography;  Surgeon: Belva Crome, MD;  Location: [place] [facility];  Service: Cardiovascular;  Laterality: N/A;   CATARACT EXTRACTION W/ INTRAOCULAR LENS  IMPLANT, BILATERAL Bilateral    CORONARY ANGIOPLASTY WITH STENT PLACEMENT  08/11/2017   "2 stents"   CORONARY STENT INTERVENTION N/A 08/11/2017   Procedure: CORONARY STENT INTERVENTION;  Surgeon: Troy Sine, MD;  Location: [place] [facility];  Service: Cardiovascular;  Laterality: N/A;   CYSTOSCOPY W/ STONE MANIPULATION     INGUINAL HERNIA REPAIR Left    INSERT / REPLACE / REMOVE PACEMAKER  12/09/2011   SJM Accent DR RF implanted by DR Allred for complete heart block and syncope   JOINT REPLACEMENT     LAPAROSCOPIC CHOLECYSTECTOMY     LEAD REVISION/REPAIR N/A 08/09/2020   Procedure: LEAD REVISION/REPAIR;  Surgeon: Cameron Grayer, MD;  Location: [place] [facility];  Service: Cardiovascular;  Laterality: N/A;   LITHOTRIPSY     LUMBAR [place]     "removed arthritis and spurs"   PERMANENT PACEMAKER INSERTION N/A 12/09/2011   Procedure: PERMANENT PACEMAKER INSERTION;  Surgeon: Cameron Grayer, MD;  Location: [facility];  Service: Cardiovascular;  Laterality: N/A;   PPM GENERATOR CHANGEOUT N/A 08/09/2020   Procedure: PPM GENERATOR CHANGEOUT;  Surgeon: Cameron Grayer, MD;  Location: [place] [facility];  Service: Cardiovascular;  Laterality: N/A;   REPLACEMENT TOTAL KNEE Right    RIGHT/LEFT HEART CATH AND CORONARY ANGIOGRAPHY N/A 08/11/2017   Procedure: RIGHT/LEFT HEART CATH AND CORONARY ANGIOGRAPHY;  Surgeon: Larey Dresser, MD;  Location: [place] [facility];  Service: Cardiovascular;  Laterality: N/A;   TRANSURETHRAL RESECTION OF PROSTATE  2017/2018    Current Medications: Current Meds  Medication Sig   acetaminophen (TYLENOL) 500 MG tablet Take 500 mg by mouth  every 6 (six) hours as needed for moderate pain.   aspirin 81 MG EC tablet Take 81 mg by mouth daily.   COVID-19 mRNA Vac-TriS, Pfizer, (PFIZER-BIONT COVID-19 VAC-TRIS) SUSP injection Inject into the muscle.   ESBRIET 267 MG TABS TAKE 3 TABLETS THREE TIMES A DAY WITH MEALS (MAINTENANCE DOSE, START AFTER COMPLETED STARTER DOSE)   gabapentin (NEURONTIN) 300 MG capsule Take 300 mg by mouth at bedtime.   glimepiride (AMARYL) 2 MG tablet Take 2 mg by mouth every morning.   leflunomide (ARAVA) 10 MG tablet Take 10 mg by mouth daily.   metFORMIN (GLUCOPHAGE) 500 MG tablet Take 500 mg by mouth daily.   nebivolol (BYSTOLIC) 5 MG tablet Take 5 mg by mouth daily.   nitroGLYCERIN (NITROSTAT) 0.3 MG SL tablet Place under the tongue.   Omega-3 Fatty Acids (OMEGA-3 FISH OIL PO) Take 1 capsule by mouth daily.   pravastatin (PRAVACHOL) 20 MG tablet Take 1 tablet (20 mg total) by  mouth every evening. *NEEDS OFFICE VISIT FOR FURTHER REFILLS*   rivaroxaban (XARELTO) 20 MG TABS tablet Take 1 tablet (20 mg total) by mouth daily with supper.   telmisartan (MICARDIS) 80 MG tablet Take 80 mg by mouth daily.   thyroid (ARMOUR) 90 MG tablet Take 90 mg by mouth every morning.    trospium (SANCTURA) 20 MG tablet Take 20 mg by mouth 2 (two) times daily.   TURMERIC PO Take 1 capsule by mouth daily.     Allergies:   Ace inhibitors, Lisinopril, and Celecoxib   Social History   Socioeconomic History   Marital status: Married    Spouse name: Not on file   Number of children: Not on file   Years of education: Not on file   Highest education level: Not on file  Occupational History   Not on file  Tobacco Use   Smoking status: Former    Packs/day: 1.00    Years: 5.00    Pack years: 5.00    Types: Cigarettes    Quit date: 07/10/1972    Years since quitting: 48.5   Smokeless tobacco: Former    Types: Chew    Quit date: 1975  Vaping Use   Vaping Use: Never used  Substance and Sexual Activity   Alcohol use: Yes     Alcohol/week: 1.0 standard drink    Types: 1 Glasses of wine per week   Drug use: No   Sexual activity: Not Currently  Other Topics Concern   Not on file  Social History Narrative   Not on file   Social Determinants of Health   Financial Resource Strain: Not on file  Food Insecurity: Not on file  Transportation Needs: Not on file  Physical Activity: Not on file  Stress: Not on file  Social Connections: Not on file     Family History: The patient's family history includes Alzheimer's disease in his paternal grandmother and sister; Arthritis in his maternal grandfather; Bone cancer in his father; Heart attack in his paternal uncle; Lung cancer in his paternal grandfather; Pancreatic cancer in his mother.  ROS:   Please see the history of present illness.    All other systems reviewed and are negative.   Risk Assessment/Calculations:    CHA2DS2-VASc Score = 5   This indicates a 7.2% annual risk of stroke. The patient's score is based upon: CHF History: No HTN History: Yes Diabetes History: Yes Stroke History: No Vascular Disease History: Yes Age Score: 2 Gender Score: 0          Physical Exam:    VS:  BP (!) 142/70   Pulse 60   Ht _0  (1.702 m)   Wt 188 lb 6.4 oz (85.5 kg)   SpO2 95%   BMI 29.51 kg/m     Wt Readings from Last 3 Encounters:  01/01/21 188 lb 6.4 oz (85.5 kg)  12/07/20 193 lb 6.4 oz (87.7 kg)  08/09/20 191 lb (86.6 kg)     GEN:  Well nourished, well developed in no acute distress HEENT: Normal NECK: No JVD; No carotid bruits LYMPHATICS: No lymphadenopathy CARDIAC: Ventricular paced rhythm, no murmurs, rubs, gallops RESPIRATORY:  Clear to auscultation without rales, wheezing or rhonchi  ABDOMEN: Soft, non-tender, non-distended MUSCULOSKELETAL:  No edema; No deformity  SKIN: Warm and dry NEUROLOGIC:  Alert and oriented x 3 PSYCHIATRIC:  Normal affect    EKGs/Labs/Other Studies Reviewed:    The following studies were reviewed  today: EKG 12/07/2020 Ventricular paced  rhythm with underlying atrial flutter 70 bpm   Echo 12/13/2020  LVEF greater than 55%, left atrium normal size, moderately enlarged left atrium no significant valvular abnormalities.  Results available in Care Everywhere   EKG:  EKG is  ordered today.  The ekg ordered today demonstrates AV paced 60 bpm  Recent Labs: 06/29/2020: ALT 14 07/25/2020: BUN 30; Creatinine, Ser 1.27; Hemoglobin 14.3; Platelets 195; Potassium 5.5; Sodium 142  Recent Lipid Panel No results found for: CHOL, TRIG, HDL, CHOLHDL, VLDL, LDLCALC, LDLDIRECT  ASSESSMENT & PLAN    Persistent atrial fibrillation-EKG today shows AV paced rhythm 60 bpm.  Reports compliance with Xarelto.  Denies bleeding issues.  Reports 3 months of increased shortness of breath with increased physical activity. Continue Xarelto, Bystolic Heart healthy low-sodium diet-salty 6 given Increase physical activity as tolerated Avoid triggers caffeine, chocolate, EtOH, dehydration etc. Start diltiazem 120 mg daily Follow-up with [name] as scheduled  DCCV-procedure discussed/ reviewed  Essential hypertension-BP today 142/70.  Well-controlled at home. Continue Bystolic, telmisartan, Start diltiazem Heart healthy low-sodium diet-salty 6 given Increase physical activity as tolerated Maintain blood pressure log  Coronary artery disease-no chest pain today.  No recent episodes of arm neck back or chest discomfort.  Underwent cardiac catheterization 08/11/2017 which showed proximal circumflex, proximal OM1 proximal diagonal 3 severe disease.  He received PCI with DES x2 to his OM1 and circumflex vessel. Continue aspirin, Bystolic, telmisartan, omega-3 fatty acids, pravastatin, nitroglycerin Heart healthy low-sodium diet-salty 6 given Increase physical activity as tolerated  OSA-reports compliance with his CPAP.  Waking up well rested. Continue CPAP use  Type 2 diabetes-glucose 99 on 07/25/2020. Continue  metformin Heart healthy low-sodium carb modified diet Increase physical activity as tolerated Follows with PCP  Hypothyroidism-TSH 0.174 on 09/13/2016 Continue Armour Thyroid Follows with PCP  Disposition: Follow-up with [name] as scheduled follow-up with Dr. Claiborne Billings in 3-4 months.    Medication Adjustments/Labs and Tests Ordered: Current medicines are reviewed at length with the patient today.  Concerns regarding medicines are outlined above.  No orders of the defined types were placed in this encounter.  No orders of the defined types were placed in this encounter.   There are no Patient Instructions on file for this visit.   Signed, Deberah Pelton, NP  01/01/2021 8:41 AM      Notice: This dictation was prepared with Dragon dictation along with smaller phrase technology. Any transcriptional errors that result from this process are unintentional and may not be corrected upon review.  I spent 13 minutes examining this patient, reviewing medications, and using patient centered shared decision making involving her cardiac care.  Prior to her visit I spent greater than 20 minutes reviewing her past medical history,  medications, and prior cardiac tests.

## 2021-01-01 ENCOUNTER — Encounter (HOSPITAL_BASED_OUTPATIENT_CLINIC_OR_DEPARTMENT_OTHER): Payer: Self-pay | Admitting: General Practice

## 2021-01-01 ENCOUNTER — Ambulatory Visit (INDEPENDENT_AMBULATORY_CARE_PROVIDER_SITE_OTHER): Payer: Medicare Other | Admitting: General Practice

## 2021-01-01 ENCOUNTER — Other Ambulatory Visit: Payer: Self-pay

## 2021-01-01 VITALS — BP 142/70 | HR 60 | Ht 67.0 in | Wt 188.4 lb

## 2021-01-01 DIAGNOSIS — E119 Type 2 diabetes mellitus without complications: Secondary | ICD-10-CM

## 2021-01-01 DIAGNOSIS — I4819 Other persistent atrial fibrillation: Secondary | ICD-10-CM

## 2021-01-01 DIAGNOSIS — I10 Essential (primary) hypertension: Secondary | ICD-10-CM | POA: Diagnosis not present

## 2021-01-01 DIAGNOSIS — Z9989 Dependence on other enabling machines and devices: Secondary | ICD-10-CM

## 2021-01-01 DIAGNOSIS — G4733 Obstructive sleep apnea (adult) (pediatric): Secondary | ICD-10-CM

## 2021-01-01 DIAGNOSIS — E039 Hypothyroidism, unspecified: Secondary | ICD-10-CM

## 2021-01-01 DIAGNOSIS — I2510 Atherosclerotic heart disease of native coronary artery without angina pectoris: Secondary | ICD-10-CM

## 2021-01-01 MED ORDER — DILTIAZEM HCL ER COATED BEADS 120 MG PO CP24: 120.0000 mg | ORAL_CAPSULE | Freq: Every day | ORAL | 3 refills | Status: DC

## 2021-01-01 NOTE — Patient Instructions (Signed)
Medication Instructions:  START diltiazem (Cardizem CD) 120 mg once daily  *If you need a refill on your cardiac medications before your next appointment, please call your pharmacy*  Follow-Up: At [facility] you and your health needs are our priority.  As part of our continuing mission to provide you with exceptional heart care, we have created designated Provider Care Teams.  These Care Teams include your primary Cardiologist (physician) and Advanced Practice Providers (APPs -  Physician Assistants and Nurse Practitioners) who all work together to provide you with the care you need, when you need it.  We recommend signing up for the patient portal called "MyChart".  Sign up information is provided on this After Visit Summary.  MyChart is used to connect with patients for Virtual Visits (Telemedicine).  Patients are able to view lab/test results, encounter notes, upcoming appointments, etc.  Non-urgent messages can be sent to your provider as well.   To learn more about what you can do with MyChart, go to [email or web address].    Your next appointment:   Appointment tomorrow with [name] 4 months with Dr. Claiborne Billings

## 2021-01-02 ENCOUNTER — Ambulatory Visit (INDEPENDENT_AMBULATORY_CARE_PROVIDER_SITE_OTHER): Payer: Medicare Other | Admitting: Internal Medicine

## 2021-01-02 ENCOUNTER — Encounter: Payer: Self-pay | Admitting: Internal Medicine

## 2021-01-02 VITALS — BP 138/60 | HR 60 | Ht 67.0 in | Wt 189.0 lb

## 2021-01-02 DIAGNOSIS — I10 Essential (primary) hypertension: Secondary | ICD-10-CM

## 2021-01-02 DIAGNOSIS — I442 Atrioventricular block, complete: Secondary | ICD-10-CM | POA: Diagnosis not present

## 2021-01-02 DIAGNOSIS — R001 Bradycardia, unspecified: Secondary | ICD-10-CM | POA: Diagnosis not present

## 2021-01-02 DIAGNOSIS — I2510 Atherosclerotic heart disease of native coronary artery without angina pectoris: Secondary | ICD-10-CM | POA: Diagnosis not present

## 2021-01-02 NOTE — Progress Notes (Signed)
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 male who presents today for routine electrophysiology followup.  Since atrial lead revision, the patient reports doing reasonably well.  He did have afib in July and was started on xarelto.  He had an echo 12/13/20 (reviewed in epic under Dr Dierdre Highman notes) which revealed EF 55% but moderate to severe MR and TR.  Today, he denies symptoms of palpitations, chest pain, shortness of breath,  lower extremity edema, dizziness, presyncope, or syncope.  The patient is otherwise without complaint today.   Past Medical History:  Diagnosis Date   Appendicitis    Atrial fibrillation ([place]) 12/12/2013   BPH (benign prostatic hypertrophy)    CAD (coronary artery disease) 12/2015   Cath by Dr [name] reveals distal and small vessel CAD.  Medical therapy advised.   Chest pain 12/03/2015   CHF (congestive heart failure) (HCC)    Complete heart block ([place])    s/p PPM   Coronary artery disease    Coronary artery disease involving native coronary artery of native heart with unstable angina pectoris ([place]) 08/11/2017   History of blood transfusion 1968   "probably; related to getting [place]   History of kidney stones    History of SCC (squamous cell carcinoma) of skin 07/24/2020   right upper arm/excision   Hyperglycemia 11/05/2013   Hyperlipidemia 11/05/2013   Hypertension    Hypothyroidism    Hypothyroidism, unspecified 11/05/2013   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  AHI was 20/h while supine.  Oxygen saturations dropped to 87%.  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   Rheumatoid arthritis ([place])    "hands" (08/11/2017)   Type II diabetes mellitus ([place])    Past Surgical History:   Procedure Laterality Date   BACK SURGERY     CARDIAC CATHETERIZATION N/A 12/28/2015   Procedure: Left Heart Cath and Coronary Angiography;  Surgeon: Belva Crome, MD;  Location: [place] [facility];  Service: Cardiovascular;  Laterality: N/A;   CATARACT EXTRACTION W/ INTRAOCULAR LENS  IMPLANT, BILATERAL Bilateral    CORONARY ANGIOPLASTY WITH STENT PLACEMENT  08/11/2017   "2 stents"   CORONARY STENT INTERVENTION N/A 08/11/2017   Procedure: CORONARY STENT INTERVENTION;  Surgeon: Troy Sine, MD;  Location: [place] [facility];  Service: Cardiovascular;  Laterality: N/A;   CYSTOSCOPY W/ STONE MANIPULATION     INGUINAL HERNIA REPAIR Left    INSERT / REPLACE / REMOVE PACEMAKER  12/09/2011   SJM Accent DR RF implanted by DR Adrieana Fennelly for complete heart block and syncope   JOINT REPLACEMENT     LAPAROSCOPIC CHOLECYSTECTOMY     LEAD REVISION/REPAIR N/A 08/09/2020   Procedure: LEAD REVISION/REPAIR;  Surgeon: [name], MD;  Location: [place] [facility];  Service: Cardiovascular;  Laterality: N/A;   LITHOTRIPSY     LUMBAR [place]     "removed arthritis and spurs"   PERMANENT PACEMAKER INSERTION N/A 12/09/2011   Procedure: PERMANENT PACEMAKER INSERTION;  Surgeon: [name], MD;  Location: [facility] CATH LAB;  Service: Cardiovascular;  Laterality: N/A;   PPM GENERATOR CHANGEOUT N/A 08/09/2020   Procedure: PPM GENERATOR CHANGEOUT;  Surgeon: [name], MD;  Location:  [place] INVASIVE [facility] LAB;  Service: Cardiovascular;  Laterality: N/A;   REPLACEMENT TOTAL KNEE Right    RIGHT/LEFT HEART CATH AND CORONARY ANGIOGRAPHY N/A 08/11/2017   Procedure: RIGHT/LEFT HEART CATH AND CORONARY ANGIOGRAPHY;  Surgeon: Larey Dresser, MD;  Location: [place] [facility];  Service: Cardiovascular;  Laterality: N/A;   TRANSURETHRAL RESECTION OF PROSTATE  2017/2018    ROS- all systems are reviewed and negative except as per HPI above  Current Outpatient Medications  Medication Sig Dispense Refill   acetaminophen  (TYLENOL) 500 MG tablet Take 500 mg by mouth every 6 (six) hours as needed for moderate pain.     aspirin 81 MG EC tablet Take 81 mg by mouth daily.     COVID-19 mRNA Vac-TriS, Pfizer, (PFIZER-BIONT COVID-19 VAC-TRIS) SUSP injection Inject into the muscle. 0.3 mL 0   diltiazem (CARDIZEM CD) 120 MG 24 hr capsule Take 1 capsule (120 mg total) by mouth daily. 90 capsule 3   ESBRIET 267 MG TABS TAKE 3 TABLETS THREE TIMES A DAY WITH MEALS (MAINTENANCE DOSE, START AFTER COMPLETED STARTER DOSE) 270 tablet 11   gabapentin (NEURONTIN) 300 MG capsule Take 300 mg by mouth at bedtime.     glimepiride (AMARYL) 2 MG tablet Take 2 mg by mouth every morning.     leflunomide (ARAVA) 10 MG tablet Take 10 mg by mouth daily.     metFORMIN (GLUCOPHAGE) 500 MG tablet Take 500 mg by mouth daily.     nebivolol (BYSTOLIC) 5 MG tablet Take 5 mg by mouth daily.     nitroGLYCERIN (NITROSTAT) 0.3 MG SL tablet Place under the tongue.     Omega-3 Fatty Acids (OMEGA-3 FISH OIL PO) Take 1 capsule by mouth daily.     pravastatin (PRAVACHOL) 20 MG tablet Take 1 tablet (20 mg total) by mouth every evening. *NEEDS OFFICE VISIT FOR FURTHER REFILLS* 90 tablet 0   rivaroxaban (XARELTO) 20 MG TABS tablet Take 1 tablet (20 mg total) by mouth daily with supper. 30 tablet 3   telmisartan (MICARDIS) 80 MG tablet Take 80 mg by mouth daily.     thyroid (ARMOUR) 90 MG tablet Take 90 mg by mouth every morning.      trospium (SANCTURA) 20 MG tablet Take 20 mg by mouth 2 (two) times daily.     TURMERIC PO Take 1 capsule by mouth daily.     No current facility-administered medications for this visit.   Facility-Administered Medications Ordered in Other Visits  Medication Dose Route Frequency Provider Last Rate Last Admin   ondansetron (ZOFRAN) 4 mg in sodium chloride 0.9 % 50 mL IVPB  4 mg Intravenous Q6H PRN Ashok Pall, MD        Physical Exam: Vitals:   01/02/21 1617  BP: 138/60  Pulse: 60  SpO2: 97%  Weight: 189 lb (85.7 kg)   Height: _0  (1.702 m)    GEN- The patient is well appearing, alert and oriented x 3 today.   Head- normocephalic, atraumatic Eyes-  Sclera clear, conjunctiva pink Ears- hearing intact Oropharynx- clear Lungs- Clear to ausculation bilaterally, normal work of breathing Chest- pacemaker pocket is well healed Heart- Regular rate and rhythm, no murmurs, rubs or gallops, PMI not laterally displaced GI- soft, NT, ND, + BS Extremities- no clubbing, cyanosis, or edema  Pacemaker interrogation- reviewed in detail today,  See PACEART report  ekg tracing ordered today is personally reviewed and shows AV paced rhythm  Assessment and Plan:  1. Symptomatic complete heart block  Normal pacemaker function See Claudia Desanctis Art report No changes today he is device dependant today  2. Afib Recently diagnosed Afib burden 4.8% Chads2vasc score is 5.  He is now on xarelto He has converted back to sinus rhythm  3. CAD No ischemic symptoms Now off of plavix  4. HTN Continue mycardis 33m daily  5. 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   Return to see me in 3 months  JThompson GrayerMD, [facility]8/02/2021 4:47 PM

## 2021-01-02 NOTE — Patient Instructions (Addendum)
Medication Instructions:  Your physician recommends that you continue on your current medications as directed. Please refer to the Current Medication list given to you today.  Labwork: None ordered.  Testing/Procedures: Echo in 3 months then [name] few days later. Your physician has requested that you have an echocardiogram. Echocardiography is a painless test that uses sound waves to create images of your heart. It provides your doctor with information about the size and shape of your heart and how well your heart's chambers and valves are working. This procedure takes approximately one hour. There are no restrictions for this procedure.   Follow-Up: Your physician wants you to follow-up in: 3 months with [name], MD   Remote monitoring is used to monitor your Pacemaker from home. This monitoring reduces the number of office visits required to check your device to one time per year. It allows [place] to keep an eye on the functioning of your device to ensure it is working properly. You are scheduled for a device check from home on 02/07/21. You may send your transmission at any time that day. If you have a wireless device, the transmission will be sent automatically. After your physician reviews your transmission, you will receive a postcard with your next transmission date.  Any Other Special Instructions Will Be Listed Below (If Applicable).  If you need a refill on your cardiac medications before your next appointment, please call your pharmacy.

## 2021-01-09 ENCOUNTER — Ambulatory Visit (INDEPENDENT_AMBULATORY_CARE_PROVIDER_SITE_OTHER): Payer: Medicare Other | Admitting: Dermatology

## 2021-01-09 ENCOUNTER — Other Ambulatory Visit: Payer: Self-pay

## 2021-01-09 DIAGNOSIS — R21 Rash and other nonspecific skin eruption: Secondary | ICD-10-CM

## 2021-01-09 DIAGNOSIS — L219 Seborrheic dermatitis, unspecified: Secondary | ICD-10-CM | POA: Diagnosis not present

## 2021-01-09 DIAGNOSIS — Z1283 Encounter for screening for malignant neoplasm of skin: Secondary | ICD-10-CM | POA: Diagnosis not present

## 2021-01-09 DIAGNOSIS — L304 Erythema intertrigo: Secondary | ICD-10-CM

## 2021-01-09 DIAGNOSIS — L821 Other seborrheic keratosis: Secondary | ICD-10-CM

## 2021-01-09 DIAGNOSIS — L578 Other skin changes due to chronic exposure to nonionizing radiation: Secondary | ICD-10-CM

## 2021-01-09 DIAGNOSIS — L409 Psoriasis, unspecified: Secondary | ICD-10-CM

## 2021-01-09 DIAGNOSIS — D1800 Hemangioma unspecified site: Secondary | ICD-10-CM

## 2021-01-09 DIAGNOSIS — D229 Melanocytic nevi, unspecified: Secondary | ICD-10-CM

## 2021-01-09 DIAGNOSIS — L814 Other melanin hyperpigmentation: Secondary | ICD-10-CM

## 2021-01-09 MED ORDER — KETOCONAZOLE 2 % EX CREA: 1.0000 "application " | TOPICAL_CREAM | Freq: Two times a day (BID) | CUTANEOUS | 1 refills | Status: DC | PRN

## 2021-01-09 NOTE — Progress Notes (Signed)
Follow-Up Visit   Subjective  Cameron [name] is a 80 y.o. male who presents for the following: Follow-up (6 month tbse. Patient here today for full body exam. Patient reports some itchy spots behind ears and elbows. Patient was using ketoconazole but states cream is not helping. ).  Patient here for full body skin exam and skin cancer screening.   The following portions of the chart were reviewed this encounter and updated as appropriate:  Tobacco  Allergies  Meds  Problems  Med Hx  Surg Hx  Fam Hx      Objective  Well appearing patient in no apparent distress; mood and affect are within normal limits.  A full examination was performed including scalp, head, eyes, ears, nose, lips, neck, chest, axillae, abdomen, back, buttocks, bilateral upper extremities, bilateral lower extremities, hands, feet, fingers, toes, fingernails, and toenails. All findings within normal limits unless otherwise noted below.  elbows, scalp, eyebrows, gluteal crease Scaly pink plaques ears, scalp, elbows, and gluteal crease rare on arms  Scaly pink plaque on gluteal crease   Mid Forehead Scaly pink plaque eyebrows and postauricular  left buttock and left upper leg Large round slightly scaly pink plaques with cigarette paper-like texture  Assessment & Plan  Psoriasis elbows, scalp, eyebrows, gluteal crease  Psoriasis is a chronic non-curable, but treatable genetic/hereditary disease that may have other systemic features affecting other organ systems such as joints (Psoriatic Arthritis). It is associated with an increased risk of inflammatory bowel disease, heart disease, non-alcoholic fatty liver disease, and depression.    Chronic condition with duration over one year. Currently flared.   Bsa 3 - 4 %   No joint pain    D/c clobetasol 0.05% foam apply twice daily as needed to affected areas for two weeks then apply twice daily on weekends only    Start Vtama non steroid cream -  apply at  affected areas of elbow and scalp. Also safe for face and folds  3 x Samples given in office  Lot [id number] exp 02/2022  Start Wynzora cream-  apply to affected areas of elbow and scalp, not safe for face or folds   1 sample given in office lot SdAL exp 07/2021  Patient instructed to let us know which sample works better. If Vtama sample works will try to get covered by insurance. If wynzora works better will try to get covered over [employer].   If still not clear will send in Hydrocortisone 2.5 % cream to use twice daily at face for up to 2 weeks.   Will consider submitting for Xtrac or [place] in future if he fails these topicals too   Topical steroids (such as triamcinolone, fluocinolone, fluocinonide, mometasone, clobetasol, halobetasol, betamethasone, hydrocortisone) can cause thinning and lightening of the skin if they are used for too long in the same area. Your physician has selected the right strength medicine for your problem and area affected on the body. Please use your medication only as directed by your physician to prevent side effects.      Seborrheic dermatitis Mid Forehead  Seborrheic Dermatitis  -  is a chronic persistent rash characterized by pinkness and scaling most commonly of the mid face but also can occur on the scalp (dandruff), ears; mid chest and mid back. It tends to be exacerbated by stress and cooler weather.  People who have neurologic disease may experience new onset or exacerbation of existing seborrheic dermatitis.  The condition is not curable but treatable and can  be controlled.  Chronic, currently flared at forehead   Start ketoconazole 2% cream twice daily for 1 week then as needed with flares. OK to use every day long-term. Use first before hydrocortisone and clobetasol to prevent atrophy   Can continue with hydrocortisone 2.5% cream twice a day as needed to face up to one week and clobetasol foam twice a day as needed up to one-two weeks at ear as  needed. Avoid applying clobetasol to face, groin, and axilla. Use as directed. Risk of skin atrophy with long-term use reviewed.     Rash and other nonspecific skin eruption left buttock and left upper leg  Koh performed in office and positive for hyphae  Start ketoconazole 2 % cream at affected area daily until resolved at buttock   Can also try OTC terbinafine cream.   If not clearing, consider broad shave biopsy to rule out mycosis fungoides  Will recheck at follow up 2-3 months  Erythema intertrigo  Related Medications ketoconazole (NIZORAL) 2 % cream Apply 1 application topically 2 (two) times daily as needed (rash). Apply to inner thigh and buttocks crease twice daily until rash is cleared  Lentigines - Scattered tan macules - Due to sun exposure - Benign-appering, observe - Recommend daily broad spectrum sunscreen SPF 30+ to sun-exposed areas, reapply every 2 hours as needed. - Call for any changes  Seborrheic Keratoses - Stuck-on, waxy, tan-brown papules and/or plaques  - Benign-appearing - Discussed benign etiology and prognosis. - Observe - Call for any changes  Melanocytic Nevi - Tan-brown and/or pink-flesh-colored symmetric macules and papules - Benign appearing on exam today - Observation - Call clinic for new or changing moles - Recommend daily use of broad spectrum spf 30+ sunscreen to sun-exposed areas.   Hemangiomas - Red papules - Discussed benign nature - Observe - Call for any changes  Actinic Damage - Chronic condition, secondary to cumulative UV/sun exposure - diffuse scaly erythematous macules with underlying dyspigmentation - Recommend daily broad spectrum sunscreen SPF 30+ to sun-exposed areas, reapply every 2 hours as needed.  - Staying in the shade or wearing long sleeves, sun glasses (UVA+UVB protection) and wide brim hats (4-inch brim around the entire circumference of the hat) are also recommended for sun protection.  - Call for new  or changing lesions.  Skin cancer screening performed today.  Return for 2 - 3 month follow up psoriasis and rash at buttock. I, Ruthell Rummage, CMA, am acting as scribe for Forest Gleason, MD.  Documentation: I have reviewed the above documentation for accuracy and completeness, and I agree with the above.  Forest Gleason, MD

## 2021-01-09 NOTE — Patient Instructions (Addendum)
Use vtama cream 1 % apply to eyebrows and scalp affected areas of elbow and scalp safe for face and folds   Apply Wynzora cream apply to affected areas of elbow and scalp, not safe for face or folds  Topical steroids (such as triamcinolone, fluocinolone, fluocinonide, mometasone, clobetasol, halobetasol, betamethasone, hydrocortisone) can cause thinning and lightening of the skin if they are used for too long in the same area. Your physician has selected the right strength medicine for your problem and area affected on the body. Please use your medication only as directed by your physician to prevent side effects.   Melanoma ABCDEs  Melanoma is the most dangerous type of skin cancer, and is the leading cause of death from skin disease.  You are more likely to develop melanoma if you: Have light-colored skin, light-colored eyes, or red or blond hair Spend a lot of time in the sun Tan regularly, either outdoors or in a tanning bed Have had blistering sunburns, especially during childhood Have a close family member who has had a melanoma Have atypical moles or large birthmarks  Early detection of melanoma is key since treatment is typically straightforward and cure rates are extremely high if we catch it early.   The first sign of melanoma is often a change in a mole or a new dark spot.  The ABCDE system is a way of remembering the signs of melanoma.  A for asymmetry:  The two halves do not match. B for border:  The edges of the growth are irregular. C for color:  A mixture of colors are present instead of an even brown color. D for diameter:  Melanomas are usually (but not always) greater than 66m - the size of a pencil eraser. E for evolution:  The spot keeps changing in size, shape, and color.  Please check your skin once per month between visits. You can use a small mirror in front and a large mirror behind you to keep an eye on the back side or your body.   If you see any new or  changing lesions before your next follow-up, please call to schedule a visit.  Please continue daily skin protection including broad spectrum sunscreen SPF 30+ to sun-exposed areas, reapplying every 2 hours as needed when you're outdoors.   Staying in the shade or wearing long sleeves, sun glasses (UVA+UVB protection) and wide brim hats (4-inch brim around the entire circumference of the hat) are also recommended for sun protection.    Recommend taking Heliocare sun protection supplement daily in sunny weather for additional sun protection. For maximum protection on the sunniest days, you can take up to 2 capsules of regular Heliocare OR take 1 capsule of Heliocare Ultra. For prolonged exposure (such as a full day in the sun), you can repeat your dose of the supplement 4 hours after your first dose. Heliocare can be purchased at [facility] at [email or web address]    If you have any questions or concerns for your doctor, please call our main line at [phone] press option 4 to reach your doctor's medical assistant. If no one answers, please leave a voicemail as directed and we will return your call as soon as possible. Messages left after 4 pm will be answered the following business day.   You may also send [place] message via [facility] We typically respond to MyChart messages within 1-2 business days.  For prescription refills, please ask your pharmacy to contact our office. Our fax  number is [phone].  If you have an urgent issue when the clinic is closed that cannot wait until the next business day, you can page your doctor at the number below.    Please note that while we do our best to be available for urgent issues outside of office hours, we are not available 24/7.   If you have an urgent issue and are unable to reach [place], you may choose to seek medical care at your doctor's office, retail clinic, urgent care center, or emergency room.  If you have a medical emergency,  please immediately call 911 or go to the emergency department.  Pager Numbers  - Dr. Nehemiah Massed: [phone]  - Dr. Laurence Ferrari: [phone]  - Dr. Nicole Kindred: [phone]  In the event of inclement weather, please call our main line at [phone] for an update on the status of any delays or closures.  Dermatology Medication Tips: Please keep the boxes that topical medications come in in order to help keep track of the instructions about where and how to use these. Pharmacies typically print the medication instructions only on the boxes and not directly on the medication tubes.   If your medication is too expensive, please contact our office at [phone] option 4 or send [place] a message through [place].   We are unable to tell what your co-pay for medications will be in advance as this is different depending on your insurance coverage. However, we may be able to find a substitute medication at lower cost or fill out paperwork to get insurance to cover a needed medication.   If a prior authorization is required to get your medication covered by your insurance company, please allow [place] 1-2 business days to complete this process.  Drug prices often vary depending on where the prescription is filled and some pharmacies may offer cheaper prices.  The website www.goodrx.com contains coupons for medications through different pharmacies. The prices here do not account for what the cost may be with help from insurance (it may be cheaper with your insurance), but the website can give you the price if you did not use any insurance.  - You can print the associated coupon and take it with your prescription to the pharmacy.  - You may also stop by our office during regular business hours and pick up a GoodRx coupon card.  - If you need your prescription sent electronically to a different pharmacy, notify our office through [facility] or by phone at [phone] option 4.

## 2021-01-11 ENCOUNTER — Telehealth: Payer: Self-pay | Admitting: Internal Medicine

## 2021-01-11 NOTE — Telephone Encounter (Signed)
   Pt c/o swelling: STAT is pt has developed SOB within 24 hours  If swelling, where is the swelling located? Right leg, from the knee down  How much weight have you gained and in what time span? no  Have you gained 3 pounds in a day or 5 pounds in a week? no  Do you have a log of your daily weights (if so, list)? none  Are you currently taking a fluid pill? Yes  Are you currently SOB? No  Have you traveled recently? No  Pt's wife calling, she said 2 days ago pt right leg was swollen pretty bad. Today is it is better but they were told by [name] to call if pt is having swelling

## 2021-01-11 NOTE — Telephone Encounter (Signed)
Please contact Mr. Venditto and ask if he has had any lower extremity injuries, if he has had any fevers, or if he has any redness in his left lower extremity.  His symptoms do not appear to be related to fluid volume overload.  His symptoms may be related to a recent ankle sprain or lower extremity cellulitis.  I would recommend that he have his symptoms do not improve with elevating his lower extremity and compression that he follow-up with his PCP.  Thank you  Jossie Ng. Smith Mcnicholas NP-C    01/11/2021, 4:02 PM [facility] [address] Office [phone] Fax [phone]

## 2021-01-11 NOTE — Telephone Encounter (Signed)
Spoke to patient's wife.She stated husband has swelling in left lower leg for the past 2 days.No swelling in right leg.He has gained 3 lbs with the past 2 weeks.No sob.He just saw Coletta Memos NP 8/9.He is eating a low salt diet.She is concerned he has never had swelling before. Stated swelling is slightly better today.Advised I will send message to Coletta Memos NP for advice.

## 2021-01-11 NOTE — Telephone Encounter (Addendum)
Spoke to patient's wife.Jesse's advice given.She stated he has not injured right leg.No redness.He will wear compression sock and keep leg elevated.If not better over the weekend,she will call PCP Mon 8/22 for appointment.

## 2021-01-14 ENCOUNTER — Encounter: Payer: Self-pay | Admitting: Dermatology

## 2021-01-15 ENCOUNTER — Other Ambulatory Visit: Payer: Self-pay | Admitting: Physician Assistant

## 2021-01-15 ENCOUNTER — Telehealth: Payer: Self-pay | Admitting: Cardiology

## 2021-01-15 ENCOUNTER — Ambulatory Visit
Admission: RE | Admit: 2021-01-15 | Discharge: 2021-01-15 | Disposition: A | Discharge status: Home / Self Care | Payer: Medicare Other | Source: Ambulatory Visit | Attending: Physician Assistant | Admitting: Physician Assistant

## 2021-01-15 ENCOUNTER — Other Ambulatory Visit: Payer: Self-pay

## 2021-01-15 DIAGNOSIS — M7989 Other specified soft tissue disorders: Secondary | ICD-10-CM

## 2021-01-15 NOTE — Telephone Encounter (Signed)
Attempted to reach the patient. There was no answer nor voicemail available. The line kept ringing.  The patient had a venous doppler today (8/23). Results in epic.

## 2021-01-15 NOTE — Telephone Encounter (Signed)
Gen cards is Dr. Claiborne Billings

## 2021-01-15 NOTE — Telephone Encounter (Signed)
Patient's wife to call to say that the patient still has a bursing on his foot and his lead is still swelling. The ultrasound didn't show anything, but dont understand why he is still swollen. Please advise

## 2021-01-21 ENCOUNTER — Ambulatory Visit: Payer: Medicare Other | Admitting: Urology

## 2021-01-23 ENCOUNTER — Other Ambulatory Visit: Payer: Self-pay

## 2021-01-23 ENCOUNTER — Ambulatory Visit (INDEPENDENT_AMBULATORY_CARE_PROVIDER_SITE_OTHER): Payer: Medicare Other | Admitting: Urology

## 2021-01-23 ENCOUNTER — Encounter: Payer: Self-pay | Admitting: Urology

## 2021-01-23 VITALS — BP 167/78 | HR 91 | Ht 67.0 in | Wt 190.0 lb

## 2021-01-23 DIAGNOSIS — N3941 Urge incontinence: Secondary | ICD-10-CM | POA: Diagnosis not present

## 2021-01-23 DIAGNOSIS — R350 Frequency of micturition: Secondary | ICD-10-CM | POA: Diagnosis not present

## 2021-01-23 DIAGNOSIS — N401 Enlarged prostate with lower urinary tract symptoms: Secondary | ICD-10-CM | POA: Diagnosis not present

## 2021-01-23 LAB — BLADDER SCAN AMB NON-IMAGING: Scan Result: 0

## 2021-01-23 MED ORDER — TROSPIUM CHLORIDE ER 60 MG PO CP24: 60.0000 mg | ORAL_CAPSULE | Freq: Every day | ORAL | 2 refills | Status: DC

## 2021-01-23 NOTE — Progress Notes (Signed)
01/23/2021 10:57 AM   Cameron [name] Nov 12, 1940 [id number]  Referring provider: [name], MD [place] [facility] [place],  [place] [address]  Chief Complaint  Patient presents with   Benign Prostatic Hypertrophy    Urologic history: 1.  BPH with detrusor overactivity -Long history of storage related voiding symptoms who did well for several years on anticholinergic medication. -Worsening voiding symptoms and 2017 and a daily study showed bladder outlet obstruction with detrusor overactivity. -[facility]URP July 2018 at [facility] -Significant urge incontinence post TUR however at his last follow-up February 2019 this had significantly improved   2.  Recurrent balanitis with phimosis -Dorsal slit 08/29/2019  3.  Erectile dysfunction -PDE 5 inhibitors, VED ineffective -Not interested in pursuing intracavernosal injections or implant surgery  HPI: 80 y.o. male presents for annual follow-up.  Since last years visit has noted increased frequency, urgency with occasional episodes of urge incontinence Remains on trospium 20 mg twice daily Denies dysuria, gross hematuria No flank, abdominal or pelvic pain  PMH: Past Medical History:  Diagnosis Date   Appendicitis    Atrial fibrillation ([place]) 12/12/2013   BPH (benign prostatic hypertrophy)    CAD (coronary artery disease) 12/2015   Cath by Dr [name] reveals distal and small vessel CAD.  Medical therapy advised.   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  AHI was 20/h while supine.  Oxygen saturations dropped to 87%.  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   Rheumatoid arthritis ([place])    "hands" (08/11/2017)   Type II diabetes mellitus [facility])     Surgical History: Past Surgical History:  Procedure Laterality Date   BACK SURGERY     CARDIAC CATHETERIZATION N/A 12/28/2015   Procedure: Left Heart Cath and Coronary Angiography;  Surgeon: Belva Crome, MD;  Location: [place] [facility];  Service: Cardiovascular;  Laterality: N/A;   CATARACT EXTRACTION W/ INTRAOCULAR LENS  IMPLANT, BILATERAL Bilateral    CORONARY ANGIOPLASTY WITH STENT PLACEMENT  08/11/2017   "2 stents"   CORONARY STENT INTERVENTION N/A 08/11/2017   Procedure: CORONARY STENT INTERVENTION;  Surgeon: Troy Sine, MD;  Location: [place] [facility];  Service: Cardiovascular;  Laterality: N/A;   CYSTOSCOPY W/ STONE MANIPULATION     INGUINAL HERNIA REPAIR Left    INSERT / REPLACE / REMOVE PACEMAKER  12/09/2011   SJM Accent DR RF implanted by DR Allred for complete heart block and syncope   JOINT REPLACEMENT     LAPAROSCOPIC CHOLECYSTECTOMY     LEAD REVISION/REPAIR N/A 08/09/2020   Procedure: LEAD REVISION/REPAIR;  Surgeon: [name], MD;  Location: [place] [facility];  Service: Cardiovascular;  Laterality: N/A;   LITHOTRIPSY     LUMBAR [place]     "removed arthritis and spurs"   PERMANENT PACEMAKER INSERTION N/A 12/09/2011   Procedure: PERMANENT PACEMAKER INSERTION;  Surgeon: [name], MD;  Location: [facility] CATH LAB;  Service: Cardiovascular;  Laterality: N/A;   PPM GENERATOR CHANGEOUT N/A 08/09/2020   Procedure: PPM GENERATOR CHANGEOUT;  Surgeon:  [name], MD;  Location: [place] [facility];  Service: Cardiovascular;  Laterality: N/A;   REPLACEMENT TOTAL KNEE Right    RIGHT/LEFT HEART CATH AND CORONARY ANGIOGRAPHY N/A 08/11/2017   Procedure: RIGHT/LEFT HEART CATH AND CORONARY ANGIOGRAPHY;  Surgeon: Larey Dresser, MD;  Location: [place] [facility];  Service: Cardiovascular;  Laterality: N/A;   TRANSURETHRAL RESECTION OF PROSTATE  2017/2018    Home Medications:  Allergies as of 01/23/2021       Reactions   Ace Inhibitors Swelling   Tongue swelling, angioedema   Lisinopril    Other reaction(s): Lip swelling, O/E - lip swelling   Celecoxib Rash   Skin rash        Medication List        Accurate as of January 23, 2021 10:57 AM. If you have any questions, ask your nurse or doctor.          STOP taking these medications    ketoconazole 2 % cream Commonly known as: NIZORAL Stopped by: Abbie Sons, MD   Pfizer-BioNT COVID-19 Vac-TriS Susp injection Generic drug: COVID-19 mRNA Vac-TriS (Pfizer) Stopped by: Abbie Sons, MD       TAKE these medications    acetaminophen 500 MG tablet Commonly known as: TYLENOL Take 500 mg by mouth every 6 (six) hours as needed for moderate pain.   aspirin 81 MG EC tablet Take 81 mg by mouth daily.   diltiazem 120 MG 24 hr capsule Commonly known as: CARDIZEM CD Take 1 capsule (120 mg total) by mouth daily.   Esbriet 267 MG Tabs Generic drug: Pirfenidone TAKE 3 TABLETS THREE TIMES A DAY WITH MEALS (MAINTENANCE DOSE, START AFTER COMPLETED STARTER DOSE)   gabapentin 300 MG capsule Commonly known as: NEURONTIN Take 300 mg by mouth at bedtime.   glimepiride 2 MG tablet Commonly known as: AMARYL Take 2 mg by mouth every morning.   leflunomide 10 MG tablet Commonly known as: ARAVA Take 10 mg by mouth daily.   metFORMIN 500 MG tablet Commonly known as: GLUCOPHAGE Take 500 mg by mouth daily.   nebivolol 5 MG tablet Commonly known as: BYSTOLIC Take 5 mg by  mouth daily.   nitroGLYCERIN 0.3 MG SL tablet Commonly known as: NITROSTAT Place under the tongue.   OMEGA-3 FISH OIL PO Take 1 capsule by mouth daily.   pravastatin 20 MG tablet Commonly known as: PRAVACHOL Take 1 tablet (20 mg total) by mouth every evening. *NEEDS OFFICE VISIT FOR FURTHER REFILLS*   rivaroxaban 20 MG Tabs tablet Commonly known as: XARELTO Take 1 tablet (20 mg total) by mouth daily with supper.   telmisartan 80 MG tablet Commonly known as: MICARDIS Take 80 mg by mouth daily.   thyroid 90 MG tablet Commonly known as: ARMOUR Take 90 mg by mouth every morning.   trospium 20 MG tablet Commonly known as: SANCTURA Take 20 mg by mouth 2 (two) times daily.   TURMERIC PO Take 1 capsule by mouth daily.        Allergies:  Allergies  Allergen Reactions   Ace Inhibitors Swelling  Tongue swelling, angioedema   Lisinopril     Other reaction(s): Lip swelling, O/E - lip swelling   Celecoxib Rash    Skin rash     Family History: Family History  Problem Relation Age of Onset   Pancreatic cancer Mother    Bone cancer Father    Alzheimer's disease Sister    Heart attack Paternal Uncle    Arthritis Maternal Grandfather    Alzheimer's disease Paternal Grandmother    Lung cancer Paternal Grandfather     Social History:  reports that he quit smoking about 48 years ago. His smoking use included cigarettes. He has a 5.00 pack-year smoking history. He quit smokeless tobacco use about 47 years ago.  His smokeless tobacco use included chew. He reports current alcohol use of about 1.0 standard drink per week. He reports that he does not use drugs.   Physical Exam: BP (!) 167/78   Pulse 91   Ht _0  (1.702 m)   Wt 190 lb (86.2 kg)   BMI 29.76 kg/m   Constitutional:  Alert and oriented, No acute distress. HEENT: [place] AT, moist mucus membranes.  Trachea midline, no masses. Cardiovascular: No clubbing, cyanosis, or edema. Skin: No rashes, bruises or suspicious  lesions. Neurologic: Grossly intact, no focal deficits, moving all 4 extremities. Psychiatric: Normal mood and affect.   Assessment & Plan:    1.  BPH with detrusor overactivity Worsening storage related voiding symptoms Will change trospium to the extended release tablets 60 mg daily as he has had minimal side effects with this medication Continue annual follow-up Call back for persistent or worsening symptoms  2.  Urge incontinence As above   Abbie Sons, MD  [facility] [address] [place], [place] [address] [phone]

## 2021-01-25 LAB — URINALYSIS, COMPLETE
Bilirubin, UA: NEGATIVE
Ketones, UA: NEGATIVE
Leukocytes,UA: NEGATIVE
Nitrite, UA: NEGATIVE
Protein,UA: NEGATIVE
RBC, UA: NEGATIVE
Specific Gravity, UA: 1.015 (ref 1.005–1.030)
Urobilinogen, Ur: 0.2 mg/dL (ref 0.2–1.0)
pH, UA: 5.5 (ref 5.0–7.5)

## 2021-01-25 LAB — MICROSCOPIC EXAMINATION
Bacteria, UA: NONE SEEN
Epithelial Cells (non renal): NONE SEEN /hpf (ref 0–10)
RBC, Urine: NONE SEEN /hpf (ref 0–2)

## 2021-01-27 ENCOUNTER — Encounter: Payer: Self-pay | Admitting: Urology

## 2021-01-31 ENCOUNTER — Other Ambulatory Visit: Payer: Self-pay | Admitting: Physician Assistant

## 2021-01-31 ENCOUNTER — Other Ambulatory Visit (HOSPITAL_COMMUNITY): Payer: Self-pay | Admitting: Physician Assistant

## 2021-01-31 DIAGNOSIS — R1033 Periumbilical pain: Secondary | ICD-10-CM

## 2021-01-31 NOTE — Telephone Encounter (Signed)
Attempted to reach the patient. There was no answer nor voicemail available

## 2021-02-01 ENCOUNTER — Ambulatory Visit
Admission: RE | Admit: 2021-02-01 | Discharge: 2021-02-01 | Disposition: A | Discharge status: Home / Self Care | Payer: Medicare Other | Source: Ambulatory Visit | Attending: Physician Assistant | Admitting: Physician Assistant

## 2021-02-01 ENCOUNTER — Other Ambulatory Visit: Payer: Self-pay

## 2021-02-01 DIAGNOSIS — R1033 Periumbilical pain: Secondary | ICD-10-CM | POA: Diagnosis not present

## 2021-02-07 ENCOUNTER — Ambulatory Visit (INDEPENDENT_AMBULATORY_CARE_PROVIDER_SITE_OTHER): Payer: Medicare Other

## 2021-02-07 DIAGNOSIS — I442 Atrioventricular block, complete: Secondary | ICD-10-CM | POA: Diagnosis not present

## 2021-02-08 ENCOUNTER — Other Ambulatory Visit: Payer: Medicare Other

## 2021-02-08 ENCOUNTER — Telehealth: Payer: Self-pay | Admitting: Internal Medicine

## 2021-02-08 ENCOUNTER — Other Ambulatory Visit: Payer: Self-pay | Admitting: *Deleted

## 2021-02-08 ENCOUNTER — Other Ambulatory Visit: Payer: Self-pay | Admitting: Internal Medicine

## 2021-02-08 DIAGNOSIS — M359 Systemic involvement of connective tissue, unspecified: Secondary | ICD-10-CM

## 2021-02-08 DIAGNOSIS — J8489 Other specified interstitial pulmonary diseases: Secondary | ICD-10-CM

## 2021-02-08 DIAGNOSIS — Z79899 Other long term (current) drug therapy: Secondary | ICD-10-CM

## 2021-02-08 DIAGNOSIS — Z8739 Personal history of other diseases of the musculoskeletal system and connective tissue: Secondary | ICD-10-CM

## 2021-02-08 NOTE — Telephone Encounter (Signed)
Pt has been scheduled for a follow up with MR 04/09/21.

## 2021-02-08 NOTE — Progress Notes (Signed)
Orders placed for LFT per Dr. Alfonso Patten and patient going to get labs today.

## 2021-02-08 NOTE — Telephone Encounter (Signed)
IPF pateont . Not seen since earlier in year Last PFT was last year Last LFT was spring 2022 He was with his wife earlier today One esbriet    Plan  - check LFT 02/08/2021 - give appt for spirp/dlco  first avil  - first 30 min f with me ILD clinic

## 2021-02-09 LAB — CUP PACEART REMOTE DEVICE CHECK
Battery Remaining Longevity: 106 mo
Battery Remaining Percentage: 95 %
Battery Voltage: 3.01 V
Brady Statistic AP VP Percent: 98 %
Brady Statistic AP VS Percent: 1 %
Brady Statistic AS VP Percent: 1.9 %
Brady Statistic AS VS Percent: 1 %
Brady Statistic RA Percent Paced: 98 %
Brady Statistic RV Percent Paced: 99 %
Date Time Interrogation Session: 20220915020012
Implantable Lead Implant Date: 20130716
Implantable Lead Implant Date: 20220317
Implantable Lead Location: 753859
Implantable Lead Location: 753860
Implantable Lead Model: 1948
Implantable Lead Model: 5076
Implantable Pulse Generator Implant Date: 20220317
Lead Channel Impedance Value: 400 Ohm
Lead Channel Impedance Value: 640 Ohm
Lead Channel Pacing Threshold Amplitude: 0.5 V
Lead Channel Pacing Threshold Amplitude: 0.5 V
Lead Channel Pacing Threshold Pulse Width: 0.5 ms
Lead Channel Pacing Threshold Pulse Width: 0.5 ms
Lead Channel Sensing Intrinsic Amplitude: 1.6 mV
Lead Channel Sensing Intrinsic Amplitude: 11.7 mV
Lead Channel Setting Pacing Amplitude: 0.75 V
Lead Channel Setting Pacing Amplitude: 2 V
Lead Channel Setting Pacing Pulse Width: 0.5 ms
Lead Channel Setting Sensing Sensitivity: 7 mV
Pulse Gen Model: 2272
Pulse Gen Serial Number: 3908614

## 2021-02-12 LAB — HEPATIC FUNCTION PANEL

## 2021-02-14 NOTE — Progress Notes (Signed)
Remote pacemaker transmission.   

## 2021-04-04 ENCOUNTER — Encounter: Payer: Medicare Other | Admitting: Dermatology

## 2021-04-08 ENCOUNTER — Other Ambulatory Visit: Payer: Self-pay

## 2021-04-08 ENCOUNTER — Ambulatory Visit (HOSPITAL_COMMUNITY): Payer: Medicare Other | Attending: Internal Medicine

## 2021-04-08 DIAGNOSIS — R001 Bradycardia, unspecified: Secondary | ICD-10-CM | POA: Diagnosis present

## 2021-04-08 DIAGNOSIS — I10 Essential (primary) hypertension: Secondary | ICD-10-CM | POA: Insufficient documentation

## 2021-04-08 DIAGNOSIS — I2510 Atherosclerotic heart disease of native coronary artery without angina pectoris: Secondary | ICD-10-CM | POA: Insufficient documentation

## 2021-04-08 DIAGNOSIS — I442 Atrioventricular block, complete: Secondary | ICD-10-CM | POA: Insufficient documentation

## 2021-04-08 LAB — ECHOCARDIOGRAM COMPLETE
Area-P 1/2: 3.85 cm2
MV M vel: 4.64 m/s
MV Peak grad: 86.1 mmHg
MV VTI: 1.45 cm2
P 1/2 time: 594 msec
Radius: 0.7 cm
S' Lateral: 2.6 cm

## 2021-04-09 ENCOUNTER — Encounter: Payer: Self-pay | Admitting: Internal Medicine

## 2021-04-09 ENCOUNTER — Telehealth: Payer: Self-pay | Admitting: Internal Medicine

## 2021-04-09 ENCOUNTER — Ambulatory Visit (INDEPENDENT_AMBULATORY_CARE_PROVIDER_SITE_OTHER): Payer: Medicare Other | Admitting: Internal Medicine

## 2021-04-09 ENCOUNTER — Encounter: Payer: Medicare Other | Admitting: *Deleted

## 2021-04-09 VITALS — BP 138/68 | HR 70 | Ht 67.0 in | Wt 193.0 lb

## 2021-04-09 DIAGNOSIS — J849 Interstitial pulmonary disease, unspecified: Secondary | ICD-10-CM

## 2021-04-09 DIAGNOSIS — Z8739 Personal history of other diseases of the musculoskeletal system and connective tissue: Secondary | ICD-10-CM

## 2021-04-09 DIAGNOSIS — Z77098 Contact with and (suspected) exposure to other hazardous, chiefly nonmedicinal, chemicals: Secondary | ICD-10-CM | POA: Diagnosis not present

## 2021-04-09 DIAGNOSIS — Z006 Encounter for examination for normal comparison and control in clinical research program: Secondary | ICD-10-CM

## 2021-04-09 DIAGNOSIS — I2510 Atherosclerotic heart disease of native coronary artery without angina pectoris: Secondary | ICD-10-CM

## 2021-04-09 DIAGNOSIS — M359 Systemic involvement of connective tissue, unspecified: Secondary | ICD-10-CM

## 2021-04-09 DIAGNOSIS — J8489 Other specified interstitial pulmonary diseases: Secondary | ICD-10-CM | POA: Diagnosis not present

## 2021-04-09 NOTE — Telephone Encounter (Signed)
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  His pulmonary function test appears stable through the COVID in the last 6 months he is reporting progressive shortness of breath.  Noticed that his echo shows grade 2 diastolic dysfunction and also evidence of increased pulmonary artery pressures  Plan - Wondering if you could consider him for right heart catheterization.  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  [Last right heart cath was in 2019 and he had borderline elevated pressures]  Thanks  MR

## 2021-04-09 NOTE — Progress Notes (Signed)
PCP [name], MD  HPI   IOV 07/10/2017  Chief Complaint  Patient presents with   Advice Only    Referred by [facility] due to SOB.  PFT done 05/28/17.  Pt has been having issues with SOB x4 months especially with exertion and has some mild chest tightness. Denies any cough.    80 year old male referred by [name] for evaluation of shortness of breath after cardiac etiologies ruled out.  He tells me that he is a remote smoker.  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  He has a long-standing history of rheumatoid arthritis followed by Dr. Jefm Bryant in [place].  He is to be on methotrexate for many years and stopped taking it because of cardiac dysfunction [he personally is convinced that methotrexate because this].  He was then on leflunomide as of 2017 but is currently not on it.  His last rheumatoid factor and CCP antibodies were negative on my personal chart review of the outside records in 2016.    Now for the last 3 or 6 months he is got insidious onset of shortness of breath that is slowly progressive.  His dyspnea on exertion relieved by rest.  Class II-3 activities.  There I  s no associated cough or orthopnea proximal nocturnal dyspnea.  He did have some edema but this got cleared up but the dyspnea is continuing to get worse.  In the last few months he has had a cardiac echo that showed pulmonary hypertension.  Did have cardiac stress test that is normal.  Had pulmonary function test that showed isolated reduction in diffusion capacity and therefore he has been referred here.  Walking desaturation test on 07/10/2017 185 feet x 3 laps on ROOM AIR:  did NOT desaturate. Rest pulse ox was 100%, final pulse ox was 98%. HR response 60/min at rest to 121/min at peak exertion. Patient [name]  Did not Desaturate < 88% . [name] did not  Desaturated  </= 3% points. Gerda Diss Vollrath yes did get tachyardic   OV 08/04/2017  Chief Complaint  Patient presents with   Follow-up    ILD    Follow-up interstitial lung disease workup  After the last visit no interim problems.  He has some chronic pedal edema that he will talk to about with his primary care physician.  He did see Dr. Jefm Bryant rheumatologist in July 15, 2017.  I reviewed his notes.  It is not specifically indicate patient has nonspecific seronegative arthritis with a differential diagnosis of seronegative rheumatoid arthritis versus psoriatic.  Patient still seems to think the root of all his problems is the methotrexate he took for over 10 years.  At this point in time there is no decompensation.  As part of the ILD workup his pulmonary function test shows mild reduction in diffusion capacity.  Correlating with this is evidence of pulmonary hypertension on the echo and high-resolution CT chest enlarged pulmonary arteries.  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  His autoimmune panel is negative.  And so the vasculitis panel and hypersensitivity pneumonitis panel.    IMPRESSION: 1. Mild subpleural reticular densities in the posterolateral aspects of both lower lobes are suspicious for mild fibrotic interstitial lung disease such as  nonspecific interstitial pneumonitis. Early/mild usual interstitial pneumonitis is not excluded. 2. Aortic atherosclerosis (ICD10-170.0). Coronary artery calcification. 3. Enlarged pulmonary arteries, indicative of pulmonary arterial Hypertension.- > in echo  Nov 2018: Pulmonary arteries: Systolic pressure was moderately increased.   PA peak pressure: 55 mm Hg (S). 4. Left renal stone, partially imaged.     Electronically Signed   By: Lorin Picket M.D.   On: 07/22/2017 15:03  OV 11/03/2017  Chief Complaint  Patient presents with   Follow-up    Pt has SOB with  exertion, some dry cough.     Follow-up suspected interstitial lung disease in the setting of rheumatoid arthritis and long methotrexate intake  He presents with his wife.  At the time of last visit I was not fully convinced that he had interstitial lung disease.  His CT scan indicated presence of pulmonary hypertension and so did his echocardiogram.  Therefore I referred him back to [name] cardiology.  In the spring 2019 he did have a right heart catheterization and left heart catheterization that showed mild pulmonary hypertension but also coronary artery blockage.  He status post 2 stents.  After that his shortness of breath improved but he tells me overall his fatigue level has not improved.  In talking to him I find out that he exercises 5 times a week doing heart track 2 times a week and the other 3 days walking a mile each time and doing weight lifting.  It appears that he takes a 20-minute nap after these exercises and then feels reenergized.  His mother feels that he does not have the effort tolerance as his younger days but he denies having any symptoms of chest pain or shortness of breath or cough when he does these heavy exertion the weight lifting or walking a mile out doing heart track.  In fact in the walking desaturation test today he walked extremely fast and had no problems.  In terms of his possible interstitial lung disease he had pulmonary function test today and felt to show some improvement and on exam he does not have any crackles.  Also his wife tells me that for the last 2 months he is using CPAP for sleep apnea and this is also helping him.  Right Heart Pressures R[facility] Procedural Findings: Hemodynamics (mmHg) RA mean 2 RV 42/6 PA 42/8, mean 21 PCWP mean 7 LV 134/8 AO 147/52       OV 06/01/2019  Subjective:  Patient ID: [name], male , DOB: Jan 26, 1941 , age 8 y.o. , MRN: [id number] , ADDRESS: [place] [address]   06/01/2019 -   Chief  Complaint  Patient presents with   Follow-up    Pt states he has been doing okay since last visit but states he has been having a little more SOB x4 weeks now. Pt also has occ coughing with yellow phlegm.   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 -returns for follow-up.  I personally have not seen him since the summer 2019.  He says overall he has been stable.  In August 2020 he had a CT scan of the chest that confirmed the presence of interstitial lung disease in the setting of his rheumatoid arthritis.  He was stable.  His pulmonary function test was stable.  He says now in the last 2 months has had a decline in shortness of breath.  There is also some cough with sputum production but that has resolved.  It is definitely present with exertion but relieved by rest.  His walking desaturation test compared to 18 months ago is roughly the same except that he is very tachycardic.  He does have a pacemaker.  He has an appoint with [name] his electrophysiologist today.  His symptom scores are listed below.  SYMPTOM SCALE - ILD 06/01/2019   O2 use no  Shortness of Breath 0 -> 5 scale with 5 being worst (score 6 If unable to do)  At rest 0  Simple tasks - showers, clothes change, eating, shaving 0  Household (dishes, doing bed, laundry) 0  Shopping 0  Walking level at own pace 2  Walking keeping up with others of same age 76  Walking up Stairs 3  Walking up Hill 3  Total (40 - 48) Dyspnea Score 10  How bad is your cough? 1  How bad is your fatigue 2       ROS - per HPI     OV 08/01/2019 - telephine visit - identified with 2 person identifier. Telephone visit - limits, risks benefits explained  Subjective:  Patient ID: [name], male , DOB: 02-04-1941 , age 69 y.o. , MRN: [id number] , ADDRESS: [place] [address]   08/01/2019 -  Follow-up  interstitial lung disease in the  setting of rheumatoid arthritis and long methotrexate intake   HPI [name] 80 y.o. - similar dyspnea compared to JAn 2021.  No better nor worse.  After last visit he has seen cardiology x2.  It appears the final conclusion is that diastolic dysfunction might be contributing to his shortness of breath.  But overall not major changes in his cardiac care.  He tells me that overall he is stable.  He had spirometry and DLCO the DLCO itself appears to be reduced compared to the recent 1 but stable compared to older ones.  The FVC suggest decline.  Patient himself feels stable.  Overall some mixed picture.  Last high-resolution CT chest was October 2020.    IMPRESSION: 1. 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 There is no significant air trapping on expiratory phase imaging. Findings are not significantly changed compared to prior examinations and remain in an "indeterminate for UIP" pattern by ATS pulmonary fibrosis criteria, differential considerations including both UIP and NSIP.   2.  Coronary artery disease and aortic atherosclerosis.   3.  Left nephrolithiasis.     Electronically Signed   By: Eddie Candle M.D.   On: 01/12/2019 15:08    OV 10/17/2019  Subjective:  Patient ID: [name], male , DOB: 27-Jan-1941 , age 42 y.o. , MRN: [id number] , ADDRESS: [place] [address]   10/17/2019 -   Chief Complaint  Patient presents with   Follow-up    pt states sobwhen doing activities.   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 -presents for follow-up after having his spirometry and DLCO and high-resolution CT chest.  Overall he  feels stable compared to the last visit but he says definitely compared to 2 years ago his symptoms are worse.  Compared to 1 year ago  his symptoms are the same.  He is kind of leery of taking new medications.  His high-resolution CT scan of the chest indicates mild progression since 2019 February.  His pulmonary function tests also indicate progression compared to 2 years ago but fluctuant in more recent times.  He is reporting agent orange exposure and is wondering if his ILD could be related to that.  He reminded me that he is already on the ILD-pro registry study.  His next scheduled visit is in October 2021.   High-resolution CT chest May 2021 Lungs/Pleura: Peripheral and basilar predominant subpleural interlobular and intra lobular septal thickening and ground-glass. Findings persist on prone imaging and appear similar to 01/12/2019 but may be minimally progressive from 07/22/2017. 4 mm peripheral left lower lobe nodule (14/101), stable from 07/22/2017 and considered benign. Lungs are otherwise clear. No pleural fluid. Airway is unremarkable. Mild air trapping.   Upper Abdomen: Visualized portions of the liver and adrenal glands are unremarkable. Stones are seen in the kidneys bilaterally. Spleen and visualized portions of the pancreas, stomach and bowel are grossly unremarkable. Cholecystectomy. No upper abdominal adenopathy.   Musculoskeletal: Degenerative changes in the spine. No worrisome lytic or sclerotic lesions.   IMPRESSION: 1. Pulmonary parenchymal pattern of fibrosis appears grossly stable from 01/12/2019 but may be minimally progressive from 07/22/2017. 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 Usual interstitial pneumonitis is certainly not excluded. Findings are indeterminate for UIP per consensus guidelines: Diagnosis of Idiopathic Pulmonary Fibrosis: An Official ATS/ERS/JRS/ALAT Clinical Practice Guideline. Wanda, Iss 5, [id number], Jan 24 2017. 2. Bilateral renal stones. 3. Aortic atherosclerosis (ICD10-I70.0). Coronary artery calcification. 4. Enlarged pulmonic trunk, indicative of pulmonary arterial hypertension.     Electronically Signed   By: Lorin Picket M.D.   On: 10/10/2019 14:00  ROS - per HPI     OV 06/29/2020  Subjective:  Patient ID: [name], male , DOB: Nov 05, 1940 , age 60 y.o. , MRN: [id number] , ADDRESS: [address] [place] [address] PCP [name], MD Patient Care Team: [name], MD as PCP - General (Unknown Physician Specialty) [name], MD as PCP - Electrophysiology (Cardiology) Troy Sine, MD as PCP - Cardiology (Cardiology) Sueanne Margarita, MD as PCP - Sleep Medicine (Cardiology)  This Provider for this visit: Treatment Team:  Attending Provider: Brand Males, MD    06/29/2020 -   Chief Complaint  Patient presents with   Follow-up    SOB unchanged, slight nonproductive cough. Esbriet doing well.   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 Normal echo Jan 2021   HPI [name] 80 y.o. -returns for follow-up.  He presents with his wife.  Last seen in May 2021.  After that in June 2021 when he started pirfenidone for progressive ILD.  He tells me that he has been tolerating pirfenidone just fine.  Of note he has not had any liver function test since he started pirfenidone.  He is not having any GI side effects of skin side effects from the pirfenidone.  In the last 6 months his ILD stable according to his history.  His walking desaturation test and symptom scores are stable.  He is up-to-date with his  Covid vaccine.  He is on leflunomide.  He is on the ILD-pro registry  study with a last visit was in November 2021.  He needs a visit every 6 months.  He is willing to get a Covid IgG checked because he is immunosuppressed.  This is in response to humoral immunity to vaccine     OV 04/09/2021  Subjective:  Patient ID: [name], male , DOB: [birth year] , age 72 y.o. 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 Normal echo Jan 2021  HPI [name] 80 y.o. -returns for follow-up.  He tells me that in the last 6 months since his last visit he is having progressively more shortness of breath with exertion relieved by rest.  In fact his dyspnea score shows worsening to a total score of 9 compared to earlier in the year.  But there is no worsening cough.  He had pulmonary function test that shows continued stability since 2021 feb but definitely worse since 2019 and 2020.  In other words the stability correlates with him starting pirfenidone.  Explained to him that the pirfenidone is helping his ILD stability.  Nevertheless his symptoms are worse.  He had echocardiogram  yesterday that shows continued elevation in pulmonary artery pressures [2019 borderline elevation] and grade 2 diastolic dysfunction.  Of note he had his ILD-Pro registry research visit today. Last liver function test for drug-induced liver injury monitoring was in February 2022.  GFR at that time was 57.  In March 2022.  His wife has suspected ILD and she is also with him in this visit.  I saw her for a scheduled visit just earlier.   SYMPTOM SCALE - ILD 10/17/2019  06/29/2020  04/09/2021   O2 use ra ra ra  Shortness of Breath 0 -> 5 scale with 5 being worst (score 6 If unable to do)    At rest 0 0 0  Simple tasks - showers, clothes change, eating, shaving 1 1 0  Household (dishes, doing bed, laundry) 1 0 0  Shopping 0 0 1  Walking level at own pace 2 0 4  Walking up Stairs _0 Total (30-36) Dyspnea Score _1 How bad is your cough? x 1   How bad is your fatigue _2 How bad is nausea 0 0 0  How bad is vomiting?  0 0 0  How bad is diarrhea? 1 0 0  How bad is anxiety? _3 How bad is depression 1 1  1  Simple office walk 185 feet x  3 laps goal with forehead probe 11/03/2017  06/01/2019  06/29/2020  04/09/2021   O2 used Room air Room air  ra  Number laps completed _0 Comments about pace Fast very  99% and 66    Resting Pulse Ox/HR 100% and 70/min 99% and 66/min 97% and 71 100% and Pulse 77  Final Pulse Ox/HR 98% and 121/min 98% and 120 min 98% and 121/min 99% and HR 114  Desaturated </= 88% no     Desaturated <= 3% points no     Got Tachycardic >/= 90/min yes     Symptoms at end of test none Mild dyspea No dyspnea Dyspnea at end  Miscellaneous comments veryu fast Mod pace fast Fast pace    PFT Results Latest Ref Rng & Units 04/17/2020 10/13/2019 07/21/2019 01/17/2019 11/03/2017 05/28/2017  FVC-Pre L 3.05 3.08 2.92 3.41 3.59 3.29  FVC-Predicted Pre % 86 86 81 94 98 90  FVC-Post L - - - - - 3.28  FVC-Predicted Post % - - - - - 89  Pre FEV1/FVC % % 81 81 82  82 83 87  Post FEV1/FCV % % - - - - - 83  FEV1-Pre L 2.46 2.49 2.40 2.78 2.98 2.85  FEV1-Predicted Pre % 98 98 94 107 114 109  FEV1-Post L - - - - - 2.72  DLCO uncorrected ml/min/mmHg 19.38 19.58 20.08 21.58 20.97 20.35  DLCO UNC% % 87 87 89 96 74 71  DLCO corrected ml/min/mmHg 19.38 19.58 20.08 - - -  DLCO COR %Predicted % 87 87 89 - - -  DLVA Predicted % 105 101 112 104 89 92  TLC L - - - - - 5.47  TLC % Predicted % - - - - - 84  RV % Predicted % - - - - - 80     CT Chest data  ECHOCARDIOGRAM COMPLETE  Result Date: 04/08/2021    ECHOCARDIOGRAM REPORT   Patient Name:   [name] Date of Exam: 04/08/2021 Medical Rec #:  [id number]       Height:       67.0 in Accession #:    [id number]      Weight:       190.0 lb Date of Birth:  [birth year]       BSA:          1.979 m Patient Age:    69 years        BP:           134/62 mmHg Patient Gender: M               HR:           86 bpm. Exam Location:  [address] Procedure: 2D Echo, 3D Echo, Cardiac Doppler and Color Doppler Indications:    I05.9 Mitral Valve Disorder  History:        Patient has prior history of Echocardiogram examinations, most                 recent 06/07/2019. CHF, CAD, Pacemaker, Mitral Valve Disease,                 Arrythmias:Bradycardia and Atrial Fibrillation; Risk                 Factors:Hypertension, Diabetes, Dyslipidemia, Former Smoker,                 Family History of Coronary  Artery Disease and Sleep Apnea.                 Complete Heart Block.  Sonographer:    Deliah Boston RDCS Referring Phys: Vincent  1. Left ventricular ejection fraction, by estimation, is 60 to 65%. Left ventricular ejection fraction by 3D volume is 61 %. The left ventricle has normal function. The left ventricle has no regional wall motion abnormalities. There is mild left ventricular hypertrophy. Left ventricular diastolic parameters are consistent with Grade II diastolic dysfunction (pseudonormalization).  2. Right  ventricular systolic function is normal. The right ventricular size is normal. There is moderately elevated pulmonary artery systolic pressure. The estimated right ventricular systolic pressure is 78.6 mmHg.  3. Left atrial size was moderately dilated.  4. Right atrial size was moderately dilated.  5. The mitral valve is abnormal. Mild to moderate mitral valve regurgitation. Moderate mitral annular calcification.  6. The tricuspid valve is abnormal. Tricuspid valve regurgitation is moderate.  7. The aortic valve is tricuspid. Aortic valve regurgitation is trivial. Aortic valve sclerosis is present, with no evidence of aortic valve stenosis. Aortic regurgitation PHT measures 594 msec.  8. Cannot exclude a small PFO. Comparison(s): Changes from prior study are noted. 06/07/2019: LVEF 50-55%. FINDINGS  Left Ventricle: Left ventricular ejection fraction, by estimation, is 60 to 65%. Left ventricular ejection fraction by 3D volume is 61 %. The left ventricle has normal function. The left ventricle has no regional wall motion abnormalities. The left ventricular internal cavity size was normal in size. There is mild left ventricular hypertrophy. Left ventricular diastolic parameters are consistent with Grade II diastolic dysfunction (pseudonormalization). Indeterminate filling pressures. Right Ventricle: The right ventricular size is normal. No increase in right ventricular wall thickness. Right ventricular systolic function is normal. There is moderately elevated pulmonary artery systolic pressure. The tricuspid regurgitant velocity is 3.60 m/s, and with an assumed right atrial pressure of 3 mmHg, the estimated right ventricular systolic pressure is 76.7 mmHg. Left Atrium: Left atrial size was moderately dilated. Right Atrium: Right atrial size was moderately dilated. Pericardium: There is no evidence of pericardial effusion. Mitral Valve: The mitral valve is abnormal. There is mild thickening of the anterior and posterior  mitral valve leaflet(s). Moderate mitral annular calcification. Mild to moderate mitral valve regurgitation. MV peak gradient, 10.1 mmHg. The mean mitral valve gradient is 2.0 mmHg. Tricuspid Valve: The tricuspid valve is abnormal. Tricuspid valve regurgitation is moderate. Aortic Valve: The aortic valve is tricuspid. Aortic valve regurgitation is trivial. Aortic regurgitation PHT measures 594 msec. Aortic valve sclerosis is present, with no evidence of aortic valve stenosis. Pulmonic Valve: The pulmonic valve was not well visualized. Pulmonic valve regurgitation is mild. Aorta: The aortic root and ascending aorta are structurally normal, with no evidence of dilitation. IAS/Shunts: Cannot exclude a small PFO. Additional Comments: A device lead is visualized.  LEFT VENTRICLE PLAX 2D LVIDd:         4.40 cm         Diastology LVIDs:         2.60 cm         LV e' medial:    7.78 cm/s LV PW:         0.90 cm         LV E/e' medial:  16.3 LV IVS:        1.30 cm         LV e' lateral:   11.90 cm/s LVOT diam:  2.00 cm         LV E/e' lateral: 10.7 LV SV:         50 LV SV Index:   25 LVOT Area:     3.14 cm        3D Volume EF                                LV 3D EF:    Left                                             ventricul                                             ar                                             ejection                                             fraction                                             by 3D                                             volume is                                             61 %.                                 3D Volume EF:                                3D EF:        61 %                                LV EDV:       145 ml                                LV ESV:       57 ml                                LV SV:  88 ml RIGHT VENTRICLE RV S prime:     15.10 cm/s TAPSE (M-mode): 1.9 cm LEFT ATRIUM             Index        RIGHT ATRIUM           Index LA diam:         5.40 cm 2.73 cm/m   RA Area:     24.50 cm LA Vol (A2C):   83.2 ml 42.05 ml/m  RA Volume:   78.00 ml  39.42 ml/m LA Vol (A4C):   90.3 ml 45.64 ml/m LA Biplane Vol: 87.3 ml 44.12 ml/m  AORTIC VALVE LVOT Vmax:   83.63 cm/s LVOT Vmean:  55.300 cm/s LVOT VTI:    0.160 m AI PHT:      594 msec  AORTA Ao Root diam: 3.20 cm Ao Asc diam:  3.70 cm MITRAL VALVE                 TRICUSPID VALVE MV Area (PHT): 3.85 cm      TR Peak grad:   51.8 mmHg MV Area VTI:   1.45 cm      TR Vmax:        360.00 cm/s MV Peak grad:  10.1 mmHg MV Mean grad:  2.0 mmHg      SHUNTS MV Vmax:       1.59 m/s      Systemic VTI:  0.16 m MV Vmean:      67.4 cm/s     Systemic Diam: 2.00 cm MV Decel Time: 197 msec MR Peak grad:   86.1 mmHg MR Mean grad:   50.0 mmHg MR Vmax:        464.00 cm/s MR Vmean:       322.0 cm/s MR PISA:        3.08 cm MR PISA Radius: 0.70 cm MV E velocity: 127.00 cm/s MV A velocity: 56.35 cm/s MV E/A ratio:  2.25 Lyman Bishop MD Electronically signed by Lyman Bishop MD Signature Date/Time: 04/08/2021/2:12:09 PM    Final            has a past medical history of Appendicitis, Atrial fibrillation ([place]) (12/12/2013), BPH (benign prostatic hypertrophy), CAD (coronary artery disease) (12/2015), Chest pain (12/03/2015), CHF (congestive heart failure) ([place]), Complete heart block ([place]), Coronary artery disease, Coronary artery disease involving native coronary artery of native heart with unstable angina pectoris ([place]) (08/11/2017), History of blood transfusion (1968), History of kidney stones, History of SCC (squamous cell carcinoma) of skin (07/24/2020), Hyperglycemia (11/05/2013), Hyperlipidemia (11/05/2013), Hypertension, Hypothyroidism, Hypothyroidism, unspecified (11/05/2013), Inflammatory arthritis (11/05/2013), Onychomycosis (12/20/2015), OSA (obstructive sleep apnea) (10/26/2017), OSA on CPAP, 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   reports that he quit smoking about 48 years ago. His smoking use included cigarettes. He has a 5.00 pack-year smoking history. He quit smokeless tobacco use about 47 years ago.  His smokeless tobacco use included chew.  Past Surgical History:  Procedure Laterality Date   BACK SURGERY     CARDIAC CATHETERIZATION N/A 12/28/2015   Procedure: Left Heart Cath and Coronary Angiography;  Surgeon: Belva Crome, MD;  Location: [facility];  Service: Cardiovascular;  Laterality: N/A;   CATARACT EXTRACTION W/ INTRAOCULAR LENS  IMPLANT, BILATERAL Bilateral    CORONARY ANGIOPLASTY WITH STENT PLACEMENT  08/11/2017   "2 stents"   CORONARY STENT INTERVENTION N/A 08/11/2017   Procedure: CORONARY STENT  INTERVENTION;  Surgeon: Troy Sine, MD;  Location: [facility];  Service: Cardiovascular;  Laterality: N/A;   CYSTOSCOPY W/ STONE MANIPULATION     INGUINAL HERNIA REPAIR Left    INSERT / REPLACE / REMOVE PACEMAKER  12/09/2011   SJM Accent DR RF implanted by DR Allred for complete heart block and syncope   JOINT REPLACEMENT     LAPAROSCOPIC CHOLECYSTECTOMY     LEAD REVISION/REPAIR N/A 08/09/2020   Procedure: LEAD REVISION/REPAIR;  Surgeon: [name], MD;  Location: [facility];  Service: Cardiovascular;  Laterality: N/A;   LITHOTRIPSY     LUMBAR [place]     "removed arthritis and spurs"   PERMANENT PACEMAKER INSERTION N/A 12/09/2011   Procedure: PERMANENT PACEMAKER INSERTION;  Surgeon: [name], MD;  Location: [facility];  Service: Cardiovascular;  Laterality: N/A;   PPM GENERATOR CHANGEOUT N/A 08/09/2020   Procedure: PPM GENERATOR CHANGEOUT;  Surgeon: [name], MD;  Location: [facility];  Service: Cardiovascular;  Laterality: N/A;   REPLACEMENT TOTAL KNEE Right    RIGHT/LEFT HEART CATH AND CORONARY ANGIOGRAPHY N/A 08/11/2017   Procedure: RIGHT/LEFT HEART CATH AND CORONARY ANGIOGRAPHY;  Surgeon: Larey Dresser, MD;  Location: [facility];  Service: Cardiovascular;  Laterality: N/A;   TRANSURETHRAL RESECTION OF PROSTATE  2017/2018    Allergies  Allergen Reactions   Ace Inhibitors Swelling    Tongue swelling, angioedema   Lisinopril     Other reaction(s): Lip swelling, O/E - lip swelling   Celecoxib Rash    Skin rash     Immunization History  Administered Date(s) Administered   Fluad Quad(high Dose 65+) 02/11/2020   Influenza, High Dose Seasonal PF 03/26/2017   Influenza-Unspecified 05/03/2015, 02/13/2016, 03/26/2016, 03/19/2017, 03/03/2018, 03/17/2019, 04/09/2020   PFIZER Comirnaty(Gray Top)Covid-19 Tri-Sucrose Vaccine 06/11/2019, 07/02/2019, 07/04/2019, 07/18/2019, 03/01/2020, 11/15/2020   PFIZER(Purple Top)SARS-COV-2 Vaccination 06/11/2019, 07/04/2019, 07/18/2019, 03/01/2020   Pneumococcal Conjugate-13 02/11/2020   Pneumococcal Polysaccharide-23 12/10/2011, 01/30/2018   Pneumococcal-Unspecified 03/26/2016, 02/04/2018   Tdap 01/17/2016, 02/04/2018   Zoster Recombinat (Shingrix) 08/14/2020, 11/29/2020    Family History  Problem Relation Age of Onset   Pancreatic cancer Mother    Bone cancer Father    Alzheimer's disease Sister    Heart attack Paternal Uncle    Arthritis Maternal Grandfather    Alzheimer's disease Paternal Grandmother    Lung cancer Paternal Grandfather      Current Outpatient Medications:    acetaminophen (TYLENOL) 500 MG tablet, Take 500 mg by mouth every 6 (six) hours as needed for moderate pain., Disp: , Rfl:    aspirin 81 MG EC tablet, Take 81 mg by mouth daily., Disp: , Rfl:    CVS TRIPLE MAGNESIUM COMPLEX PO, Take by mouth., Disp: , Rfl:    diltiazem (TIAZAC) 120 MG 24 hr capsule, Take 120 mg by mouth., Disp: , Rfl:    ESBRIET 267 MG TABS, TAKE 3 TABLETS THREE TIMES A DAY WITH MEALS (MAINTENANCE DOSE, START AFTER COMPLETED STARTER DOSE), Disp: 270 tablet, Rfl: 11   gabapentin (NEURONTIN) 300 MG capsule, Take 300 mg by mouth at bedtime., Disp: , Rfl:    glimepiride  (AMARYL) 2 MG tablet, Take 2 mg by mouth every morning., Disp: , Rfl:    leflunomide (ARAVA) 10 MG tablet, Take 10 mg by mouth daily., Disp: , Rfl:    metFORMIN (GLUCOPHAGE) 500 MG tablet, Take 500 mg by mouth daily., Disp: , Rfl:    nebivolol (BYSTOLIC) 5 MG tablet,  Take 5 mg by mouth daily., Disp: , Rfl:    nitroGLYCERIN (NITROSTAT) 0.3 MG SL tablet, Place under the tongue., Disp: , Rfl:    Omega-3 Fatty Acids (OMEGA-3 FISH OIL PO), Take 1 capsule by mouth daily., Disp: , Rfl:    pravastatin (PRAVACHOL) 20 MG tablet, Take 1 tablet (20 mg total) by mouth every evening. *NEEDS OFFICE VISIT FOR FURTHER REFILLS*, Disp: 90 tablet, Rfl: 0   telmisartan (MICARDIS) 80 MG tablet, Take 80 mg by mouth daily., Disp: , Rfl:    thyroid (ARMOUR) 90 MG tablet, Take 90 mg by mouth every morning. , Disp: , Rfl:    Trospium Chloride 60 MG CP24, Take 1 capsule (60 mg total) by mouth daily. (Patient taking differently: Take 80 mg by mouth daily.), Disp: 30 capsule, Rfl: 2   XARELTO 20 MG TABS tablet, TAKE ONE TABLET BY MOUTH EVERY DAY WITH SUPPERDISCONTINUE CLOPIDROGE*, Disp: 30 tablet, Rfl: 3   diltiazem (CARDIZEM CD) 120 MG 24 hr capsule, Take 1 capsule (120 mg total) by mouth daily., Disp: 90 capsule, Rfl: 3   TURMERIC PO, Take 1 capsule by mouth daily. (Patient not taking: Reported on 04/09/2021), Disp: , Rfl:  No current facility-administered medications for this visit.  Facility-Administered Medications Ordered in Other Visits:    ondansetron (ZOFRAN) 4 mg in sodium chloride 0.9 % 50 mL IVPB, 4 mg, Intravenous, Q6H PRN, Ashok Pall, MD      Objective:   Vitals:   04/09/21 1114  BP: 138/68  Pulse: 70  SpO2: 98%  Weight: 193 lb (87.5 kg)  Height: _0  (1.702 m)    Estimated body mass index is 30.23 kg/m as calculated from the following:   Height as of this encounter: _1  (1.702 m).   Weight as of this encounter: 193 lb (87.5 kg).  _2 @  Filed Weights   04/09/21 1114  Weight:  193 lb (87.5 kg)     Physical Exam  General: No distress. Looks well Neuro: Alert and Oriented x 3. GCS 15. Speech normal Psych: Pleasant Resp:  Barrel Chest - no.  Wheeze - no, Crackles - yes, No overt respiratory distress CVS: Normal heart sounds. Murmurs - no Ext: Stigmata of Connective Tissue Disease - no HEENT: Normal upper airway. PEERL +. No post nasal drip        Assessment:       ICD-10-CM   1. Interstitial lung disease due to connective tissue disease ([place])  J84.89    M35.9     2. History of rheumatoid arthritis  Z87.39     3. H/O agent Orange exposure  Z77.098          Plan:     Patient Instructions  Interstitial lung disease due to connective tissue disease ([place]) History of rheumatoid arthritis and immunosuppressed on leflunomide Hx of Agent Orange Exposure Vaccine counseling   - Pulmonary Fibrosis based on PFT is every so slowly and steadiy progressive  sinc 2019 and 2020 but stable since last visit May 2021 and starting esbriet in June 2021 (based on PFT)  -Symptoms seem progressive and concerned it could be cardiac related from stiff heart muscle or pulmonary hypertension  Plan -I have sent a message to her cardiologist [name] to see if he will benefit from a right heart catheterization to evaluate for pulmonary hypertension -CBC, chemistry and liver function test today -Continue Esbriet per protocol [eat with food, space adequately between meals and apply sunscreen at all times] -Continue daily exercise  as before - Email Marlane Mingle [email or web address] -who is a local support group leader and join the support group  -Not sure if he did it last time if not you can do it this time -Do high-resolution CT chest supine and prone in 3 months  Follow-up --Return to see [name] in a 30-minute slot in3  months but after HRCT  -ILD symptom score and simple walking desaturation test at follow-up -Return sooner if needed    SIGNATURE    Dr.  Brand Males, M.D., F.C.C.P,  Pulmonary and Critical Care Medicine Staff Physician, [place] Director - Interstitial Lung Disease  Program  Pulmonary [place] [facility] [place], [place], [address]  Pager: [phone], If no answer or between  15:00h - 7:00h: call [phone] Telephone: [phone]  11:58 AM 04/09/2021

## 2021-04-09 NOTE — Addendum Note (Signed)
Addended by: Konrad Felix L on: 04/09/2021 12:21 PM   Modules accepted: Orders

## 2021-04-09 NOTE — Research (Signed)
Title: Chronic Fibrosing Interstitial Lung Disease with Progressive Phenotype Prospective Outcomes (ILD-PRO) Registry    Protocol #: IPF-PRO-SUB, Clinical Trials # [id number], Sponsor: [employer]   Protocol Version Amendment 4 dated 12Sep2019  and confirmed current on  Consent Version for today's visit date of  [facility] [place] IRB Approved Version 29 Apr 2018 Revised 29 Apr 2018   Objectives:  Describe current approaches to diagnosis and treatment of chronic fibrosing ILDs with progressive phenotype  Describe the natural history of chronic fibrosing ILDs with progressive phenotype  Assess quality of life from self-administered participant reported questionnaires for each disease group  Describe participant interactions with the healthcare system, describe treatment practices across multiple institutions for each disease group  Collect biological samples linked to well characterized chronic fibrosing ILDs with progressive phenotype to identify disease biomarkers  Collect data and biological samples that will support future research studies.                                            Key Inclusion Criteria: Willing and able to provide informed consent  Age ? 30 years  Diagnosis of a non-IPF ILD of any duration, including, but not limited to Idiopathic Non-Specific Interstitial Pneumonia (INSIP), Unclassifiable Idiopathic Interstitial Pneumonias (IIPs), Interstitial Pneumonia with Autoimmune Features (IPAF), Autoimmune ILDs such as Rheumatoid Arthritis (RA-ILD) and Systemic Sclerosis (SSC-ILD), Chronic Hypersensitivity Pneumonitis (HP), Sarcoidosis or Exposure-related ILDs such as asbestosis.  Chronic fibrosing ILD defined by reticular abnormality with traction bronchiectasis with or without honeycombing confirmed by chest HRCT scan and/or lung biopsy.  Progressive phenotype as defined by fulfilling at least one of the criteria below of fibrotic changes (progression set point)  within the last 24 months regardless of treatment considered appropriate in individual ILDs:  decline in FVC % predicted (% pred) based on >10% relative decline  decline in FVC % pred based on ? 5 - <10% relative decline in FVC combined with worsening of respiratory symptoms as assessed by the site investigator  decline in FVC % pred based on ? 5 - <10% relative decline in FVC combined with increasing extent of fibrotic changes on chest imaging (HRCT scan) as assessed by the site investigator  decline in DLCO % pred based on ? 10% relative decline  worsening of respiratory symptoms as well as increasing extent of fibrotic changes on chest imaging (HRCT scan) as assessed by the site investigator independent of FVC change.     Key Exclusion Criteria: Malignancy, treated or untreated, other than skin or early stage prostate cancer, within the past 5 years  Currently listed for lung transplantation at the time of enrollment  Currently enrolled in a clinical trial at the time of enrollment in this registry       Clinical Research Coordinator / Research RN note : This visit for Subject [id number] with DOB: 1940/06/28 on 04/09/2021 for the above protocol is Visit/Encounter 3 and is for purpose of research.    Subject expressed continued interest and consent in continuing as a study subject. Subject confirmed that there was no change in contact information (e.g. address, telephone, email). Subject thanked for participation in research and contribution to science.      During this visit on , the subject completed the blood work and questionnaires per the above referenced protocol. Please refer to the subject's paper source binder for further details.   Signed by Leda Gauze  Risk analyst [facility]  [place], [place]

## 2021-04-09 NOTE — Patient Instructions (Addendum)
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 to see if he will benefit from a right heart catheterization to evaluate for pulmonary hypertension -CBC, chemistry and liver function test today -Continue Esbriet per protocol [eat with food, space adequately between meals and apply sunscreen at all times] -Continue daily exercise as before - Email [employer] [email or web address] -who is a local support group leader and join the support group  -Not sure if he did it last time if not you can do it this time -Do high-resolution CT chest supine and prone in 3 months  Follow-up --Return to see [name] in a 30-minute slot in3  months but after HRCT  -ILD symptom score and simple walking desaturation test at follow-up -Return sooner if needed

## 2021-04-10 ENCOUNTER — Telehealth: Payer: Self-pay

## 2021-04-10 NOTE — Telephone Encounter (Signed)
[place] alert for AT/AF Ongoing event for AF/Flu since 11/6 Burden 9.7%, CVR.  Xarelto, Bystolic, and Diltiazem prescribed.  Attempted to contact patient to assess for symptoms and medication compliance. No answer, LMTCB on home #.

## 2021-04-10 NOTE — Telephone Encounter (Signed)
Returned phone call from patients wife [place] (okay to speak with per DPR.   States patient has complained of increased fatigue but no other complaints. Patient is compliant with medications on file. Has upcoming apt. With [name] on 04/12/21. Aware of apt. And stressed importance of attending. Verbalized understanding. Advised if any further symptoms arise to call to let us know.

## 2021-04-12 ENCOUNTER — Other Ambulatory Visit: Payer: Self-pay

## 2021-04-12 ENCOUNTER — Ambulatory Visit (INDEPENDENT_AMBULATORY_CARE_PROVIDER_SITE_OTHER): Payer: Medicare Other | Admitting: Internal Medicine

## 2021-04-12 ENCOUNTER — Encounter: Payer: Self-pay | Admitting: Internal Medicine

## 2021-04-12 VITALS — BP 130/72 | HR 70 | Ht 67.0 in | Wt 194.1 lb

## 2021-04-12 DIAGNOSIS — I2510 Atherosclerotic heart disease of native coronary artery without angina pectoris: Secondary | ICD-10-CM

## 2021-04-12 DIAGNOSIS — I071 Rheumatic tricuspid insufficiency: Secondary | ICD-10-CM

## 2021-04-12 DIAGNOSIS — I10 Essential (primary) hypertension: Secondary | ICD-10-CM | POA: Diagnosis not present

## 2021-04-12 DIAGNOSIS — I480 Paroxysmal atrial fibrillation: Secondary | ICD-10-CM | POA: Diagnosis not present

## 2021-04-12 DIAGNOSIS — I442 Atrioventricular block, complete: Secondary | ICD-10-CM

## 2021-04-12 DIAGNOSIS — I340 Nonrheumatic mitral (valve) insufficiency: Secondary | ICD-10-CM

## 2021-04-12 LAB — CUP PACEART INCLINIC DEVICE CHECK
Battery Remaining Longevity: 91 mo
Battery Voltage: 3.02 V
Brady Statistic RA Percent Paced: 86 %
Brady Statistic RV Percent Paced: 99.97 %
Date Time Interrogation Session: 20221118143357
Implantable Lead Implant Date: 20130716
Implantable Lead Implant Date: 20220317
Implantable Lead Location: 753859
Implantable Lead Location: 753860
Implantable Lead Model: 1948
Implantable Lead Model: 5076
Implantable Pulse Generator Implant Date: 20220317
Lead Channel Impedance Value: 350 Ohm
Lead Channel Impedance Value: 625 Ohm
Lead Channel Pacing Threshold Amplitude: 0.375 V
Lead Channel Pacing Threshold Pulse Width: 0.5 ms
Lead Channel Sensing Intrinsic Amplitude: 11.7 mV
Lead Channel Sensing Intrinsic Amplitude: 2.2 mV
Lead Channel Setting Pacing Amplitude: 0.625
Lead Channel Setting Pacing Amplitude: 2 V
Lead Channel Setting Pacing Pulse Width: 0.5 ms
Lead Channel Setting Sensing Sensitivity: 7 mV
Pulse Gen Model: 2272
Pulse Gen Serial Number: 3908614

## 2021-04-12 NOTE — Patient Instructions (Addendum)
Medication Instructions:  Your physician recommends that you continue on your current medications as directed. Please refer to the Current Medication list given to you today. *If you need a refill on your cardiac medications before your next appointment, please call your pharmacy*  Lab Work: None. If you have labs (blood work) drawn today and your tests are completely normal, you will receive your results only by: [place] (if you have MyChart) OR A paper copy in the mail If you have any lab test that is abnormal or we need to change your treatment, we will call you to review the results.  Testing/Procedures: None.  Follow-Up: At [facility], you and your health needs are our priority.  As part of our continuing mission to provide you with exceptional heart care, we have created designated Provider Care Teams.  These Care Teams include your primary Cardiologist (physician) and Advanced Practice Providers (APPs -  Physician Assistants and Nurse Practitioners) who all work together to provide you with the care you need, when you need it.  Your physician wants you to follow-up in: Next available with Dr. Evette Georges team 6 months with   one of the following Advanced Practice Providers on your designated Care Team:    Cameron [name], Cameron [name]   You will receive a reminder letter in the mail two months in advance. If you don't receive a letter, please call our office to schedule the follow-up appointment.  Remote monitoring is used to monitor your Pacemaker from home. This monitoring reduces the number of office visits required to check your device to one time per year. It allows [place] to keep an eye on the functioning of your device to ensure it is working properly. You are scheduled for a device check from home on 05-09-21. You may send your transmission at any time that day. If you have a wireless device, the transmission will be sent automatically. After your physician reviews your  transmission, you will receive a postcard with your next transmission date.  We recommend signing up for the patient portal called "MyChart".  Sign up information is provided on this After Visit Summary.  MyChart is used to connect with patients for Virtual Visits (Telemedicine).  Patients are able to view lab/test results, encounter notes, upcoming appointments, etc.  Non-urgent messages can be sent to your provider as well.   To learn more about what you can do with MyChart, go to [email or web address].    Any Other Special Instructions Will Be Listed Below (If Applicable).  Low-Sodium Eating Plan (2 grams a day) Sodium, which is an element that makes up salt, helps you maintain a healthy balance of fluids in your body. Too much sodium can increase your blood pressure and cause fluid and waste to be held in your body. Your health care provider or dietitian may recommend following this plan if you have high blood pressure (hypertension), kidney disease, liver disease, or heart failure. Eating less sodium can help lower your blood pressure, reduce swelling, and protect your heart, liver, and kidneys. What are tips for following this plan? Reading food labels The Nutrition Facts label lists the amount of sodium in one serving of the food. If you eat more than one serving, you must multiply the listed amount of sodium by the number of servings. Choose foods with less than 140 mg of sodium per serving. Avoid foods with 300 mg of sodium or more per serving. Shopping  Look for lower-sodium products, often labeled as "low-sodium" or "no  salt added." Always check the sodium content, even if foods are labeled as "unsalted" or "no salt added." Buy fresh foods. Avoid canned foods and pre-made or frozen meals. Avoid canned, cured, or processed meats. Buy breads that have less than 80 mg of sodium per slice. Cooking  Eat more home-cooked food and less restaurant, buffet, and fast food. Avoid adding  salt when cooking. Use salt-free seasonings or herbs instead of table salt or sea salt. Check with your health care provider or pharmacist before using salt substitutes. Cook with plant-based oils, such as canola, sunflower, or olive oil. Meal planning When eating at a restaurant, ask that your food be prepared with less salt or no salt, if possible. Avoid dishes labeled as brined, pickled, cured, smoked, or made with soy sauce, miso, or teriyaki sauce. Avoid foods that contain MSG (monosodium glutamate). MSG is sometimes added to [place] food, bouillon, and some canned foods. Make meals that can be grilled, baked, poached, roasted, or steamed. These are generally made with less sodium. General information Most people on this plan should limit their sodium intake to 1,500-2,000 mg (milligrams) of sodium each day. What foods should I eat? Fruits Fresh, frozen, or canned fruit. Fruit juice. Vegetables Fresh or frozen vegetables. "No salt added" canned vegetables. "No salt added" tomato sauce and paste. Low-sodium or reduced-sodium tomato and vegetable juice. Grains Low-sodium cereals, including oats, puffed wheat and rice, and shredded wheat. Low-sodium crackers. Unsalted rice. Unsalted pasta. Low-sodium bread. Whole-grain breads and whole-grain pasta. Meats and other proteins Fresh or frozen (no salt added) meat, poultry, seafood, and fish. Low-sodium canned tuna and salmon. Unsalted nuts. Dried peas, beans, and lentils without added salt. Unsalted canned beans. Eggs. Unsalted nut butters. Dairy Milk. Soy milk. Cheese that is naturally low in sodium, such as ricotta cheese, fresh mozzarella, or Swiss cheese. Low-sodium or reduced-sodium cheese. Cream cheese. Yogurt. Seasonings and condiments Fresh and dried herbs and spices. Salt-free seasonings. Low-sodium mustard and ketchup. Sodium-free salad dressing. Sodium-free light mayonnaise. Fresh or refrigerated horseradish. Lemon juice. Vinegar. Other  foods Homemade, reduced-sodium, or low-sodium soups. Unsalted popcorn and pretzels. Low-salt or salt-free chips. The items listed above may not be a complete list of foods and beverages you can eat. Contact a dietitian for more information. What foods should I avoid? Vegetables Sauerkraut, pickled vegetables, and relishes. Olives. [place] fries. Onion rings. Regular canned vegetables (not low-sodium or reduced-sodium). Regular canned tomato sauce and paste (not low-sodium or reduced-sodium). Regular tomato and vegetable juice (not low-sodium or reduced-sodium). Frozen vegetables in sauces. Grains Instant hot cereals. Bread stuffing, pancake, and biscuit mixes. Croutons. Seasoned rice or pasta mixes. Noodle soup cups. Boxed or frozen macaroni and cheese. Regular salted crackers. Self-rising flour. Meats and other proteins Meat or fish that is salted, canned, smoked, spiced, or pickled. Precooked or cured meat, such as sausages or meat loaves. Berniece Salines. Ham. Pepperoni. Hot dogs. Corned beef. Chipped beef. Salt pork. Jerky. Pickled herring. Anchovies and sardines. Regular canned tuna. Salted nuts. Dairy Processed cheese and cheese spreads. Hard cheeses. Cheese curds. Blue cheese. Feta cheese. String cheese. Regular cottage cheese. Buttermilk. Canned milk. Fats and oils Salted butter. Regular margarine. Ghee. Bacon fat. Seasonings and condiments Onion salt, garlic salt, seasoned salt, table salt, and sea salt. Canned and packaged gravies. Worcestershire sauce. Tartar sauce. Barbecue sauce. Teriyaki sauce. Soy sauce, including reduced-sodium. Steak sauce. Fish sauce. Oyster sauce. Cocktail sauce. Horseradish that you find on the shelf. Regular ketchup and mustard. Meat flavorings and tenderizers. Bouillon cubes. Hot  sauce. Pre-made or packaged marinades. Pre-made or packaged taco seasonings. Relishes. Regular salad dressings. Salsa. Other foods Salted popcorn and pretzels. Corn chips and puffs. Potato and  tortilla chips. Canned or dried soups. Pizza. Frozen entrees and pot pies. The items listed above may not be a complete list of foods and beverages you should avoid. Contact a dietitian for more information. Summary Eating less sodium can help lower your blood pressure, reduce swelling, and protect your heart, liver, and kidneys. Most people on this plan should limit their sodium intake to 1,500-2,000 mg (milligrams) of sodium each day. Canned, boxed, and frozen foods are high in sodium. Restaurant foods, fast foods, and pizza are also very high in sodium. You also get sodium by adding salt to food. Try to cook at home, eat more fresh fruits and vegetables, and eat less fast food and canned, processed, or prepared foods. This information is not intended to replace advice given to you by your health care provider. Make sure you discuss any questions you have with your health care provider. Document Revised: 06/17/2019 Document Reviewed: 04/13/2019 Elsevier Patient Education  2022 [employer].

## 2021-04-12 NOTE — Progress Notes (Signed)
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 male who presents today for routine electrophysiology followup.  Since last being seen in our clinic, the patient reports doing reasoanbly well. He is in atrial flutter.  This has been present for about 2 weeks.  He has associated fatigue and decreased exercise tolerance.  He also has noticed edema. Today, he denies symptoms of palpitations, chest pain, dizziness, presyncope, or syncope.  The patient is otherwise without complaint today.   Past Medical History:  Diagnosis Date   Appendicitis    Atrial fibrillation ([place]) 12/12/2013   BPH (benign prostatic hypertrophy)    CAD (coronary artery disease) 12/2015   Cath by Dr [name] reveals distal and small vessel CAD.  Medical therapy advised.   Chest pain 12/03/2015   CHF (congestive heart failure) (HCC)    Complete heart block ([place])    s/p PPM   Coronary artery disease    Coronary artery disease involving native coronary artery of native heart with unstable angina pectoris ([place]) 08/11/2017   History of blood transfusion 1968   "probably; related [place]   History of kidney stones    History of SCC (squamous cell carcinoma) of skin 07/24/2020   right upper arm/excision   Hyperglycemia 11/05/2013   Hyperlipidemia 11/05/2013   Hypertension    Hypothyroidism    Hypothyroidism, unspecified 11/05/2013   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  AHI was 20/h while supine.  Oxygen saturations dropped to 87%.  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   Rheumatoid arthritis ([place])    "hands" (08/11/2017)   Type II diabetes mellitus ([place])    Past Surgical History:  Procedure Laterality Date   BACK SURGERY     CARDIAC  CATHETERIZATION N/A 12/28/2015   Procedure: Left Heart Cath and Coronary Angiography;  Surgeon: Belva Crome, MD;  Location: [facility];  Service: Cardiovascular;  Laterality: N/A;   CATARACT EXTRACTION W/ INTRAOCULAR LENS  IMPLANT, BILATERAL Bilateral    CORONARY ANGIOPLASTY WITH STENT PLACEMENT  08/11/2017   "2 stents"   CORONARY STENT INTERVENTION N/A 08/11/2017   Procedure: CORONARY STENT INTERVENTION;  Surgeon: Troy Sine, MD;  Location: [facility];  Service: Cardiovascular;  Laterality: N/A;   CYSTOSCOPY W/ STONE MANIPULATION     INGUINAL HERNIA REPAIR Left    INSERT / REPLACE / REMOVE PACEMAKER  12/09/2011   SJM Accent DR RF implanted by DR Amaziah Ghosh for complete heart block and syncope   JOINT REPLACEMENT     LAPAROSCOPIC CHOLECYSTECTOMY     LEAD REVISION/REPAIR N/A 08/09/2020   Procedure: LEAD REVISION/REPAIR;  Surgeon: [name], MD;  Location: [facility];  Service: Cardiovascular;  Laterality: N/A;   LITHOTRIPSY     LUMBAR [place]     "removed arthritis and spurs"   PERMANENT PACEMAKER INSERTION N/A 12/09/2011   Procedure: PERMANENT PACEMAKER INSERTION;  Surgeon: [name], MD;  Location: [facility];  Service: Cardiovascular;  Laterality: N/A;   PPM GENERATOR CHANGEOUT N/A 08/09/2020   Procedure: PPM GENERATOR CHANGEOUT;  Surgeon: [name], MD;  Location: [facility];  Service: Cardiovascular;  Laterality: N/A;  REPLACEMENT TOTAL KNEE Right    RIGHT/LEFT HEART CATH AND CORONARY ANGIOGRAPHY N/A 08/11/2017   Procedure: RIGHT/LEFT HEART CATH AND CORONARY ANGIOGRAPHY;  Surgeon: Larey Dresser, MD;  Location: [facility];  Service: Cardiovascular;  Laterality: N/A;   TRANSURETHRAL RESECTION OF PROSTATE  2017/2018    ROS- all systems are reviewed and negative except as per HPI above  Meds reviewed   Physical Exam: Vitals:   04/12/21 1422  BP: 130/72  Pulse: 70  SpO2: 99%  Weight: 194 lb 1.6 oz (88 kg)  Height: 5' 7"  (1.702 m)    GEN- The patient is well appearing, alert and oriented x 3 today.   Head- normocephalic, atraumatic Eyes-  Sclera clear, conjunctiva pink Ears- hearing intact Oropharynx- clear Lungs- few basilar rales, normal work of breathing Chest- pacemaker pocket is well healed Heart- Regular rate and rhythm (V paced) GI- soft, NT, ND, + BS Extremities- no clubbing, cyanosis, +1 edema  Pacemaker interrogation- reviewed in detail today,  See PACEART report  ekg tracing ordered today is personally reviewed and shows atrial flutter, V paced  Echo 04/08/21- EF 60%, mild LVH, RV systolic pressure is 54 mm Hg, moderate biatrial enlargement, mild to moderate MR, moderate TR    Assessment and Plan:  1. Symptomatic complete heart block Normal pacemaker function See Pace Art report No changes today he is device dependant today  2. Persistent atrial fibrillation/ atrial flutter Chads2vasc score is 5.  He is on xarelto He has been in atrial flutter (Typical) since 03/31/21.  He reports compliance with xarelto without interruptin. Today, I was able to pace terminate his atrial flutter with NIPS using his device.  The atrial flutter CL was 240 msec.  With atrial pacing at 180 msec, his atrial flutter terminated.  He remained in sinus thereafter.  3. HTN Stable No change required today  4. CAD No ischemic symptoms  5. 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 OSA May benefit from optimization of his CPAP given RV pressures on echo  Risks, benefits and potential toxicities for medications prescribed and/or refilled reviewed with patient today.   Follow-up with Dr Claiborne Billings at next available Return to see EP in 6 months  [name] MD, [facility] 04/12/2021 2:35 PM

## 2021-04-21 NOTE — Telephone Encounter (Signed)
I will route to Dr Claiborne Billings who is his primary cardiologist.

## 2021-05-07 ENCOUNTER — Other Ambulatory Visit: Payer: Self-pay | Admitting: Urology

## 2021-05-09 ENCOUNTER — Other Ambulatory Visit: Payer: Self-pay

## 2021-05-09 ENCOUNTER — Ambulatory Visit (INDEPENDENT_AMBULATORY_CARE_PROVIDER_SITE_OTHER): Payer: Medicare Other | Admitting: Cardiovascular Disease

## 2021-05-09 ENCOUNTER — Ambulatory Visit (INDEPENDENT_AMBULATORY_CARE_PROVIDER_SITE_OTHER): Payer: Medicare Other

## 2021-05-09 ENCOUNTER — Encounter: Payer: Self-pay | Admitting: Cardiovascular Disease

## 2021-05-09 DIAGNOSIS — E039 Hypothyroidism, unspecified: Secondary | ICD-10-CM

## 2021-05-09 DIAGNOSIS — I10 Essential (primary) hypertension: Secondary | ICD-10-CM

## 2021-05-09 DIAGNOSIS — I2510 Atherosclerotic heart disease of native coronary artery without angina pectoris: Secondary | ICD-10-CM | POA: Diagnosis not present

## 2021-05-09 DIAGNOSIS — G4733 Obstructive sleep apnea (adult) (pediatric): Secondary | ICD-10-CM | POA: Diagnosis not present

## 2021-05-09 DIAGNOSIS — I442 Atrioventricular block, complete: Secondary | ICD-10-CM

## 2021-05-09 DIAGNOSIS — I272 Pulmonary hypertension, unspecified: Secondary | ICD-10-CM

## 2021-05-09 DIAGNOSIS — Z950 Presence of cardiac pacemaker: Secondary | ICD-10-CM

## 2021-05-09 DIAGNOSIS — E785 Hyperlipidemia, unspecified: Secondary | ICD-10-CM

## 2021-05-09 DIAGNOSIS — J849 Interstitial pulmonary disease, unspecified: Secondary | ICD-10-CM

## 2021-05-09 DIAGNOSIS — I480 Paroxysmal atrial fibrillation: Secondary | ICD-10-CM

## 2021-05-09 DIAGNOSIS — Z9989 Dependence on other enabling machines and devices: Secondary | ICD-10-CM

## 2021-05-09 LAB — CUP PACEART REMOTE DEVICE CHECK
Battery Remaining Longevity: 102 mo
Battery Remaining Percentage: 92 %
Battery Voltage: 3.02 V
Brady Statistic AP VP Percent: 98 %
Brady Statistic AP VS Percent: 1 %
Brady Statistic AS VP Percent: 2 %
Brady Statistic AS VS Percent: 1 %
Brady Statistic RA Percent Paced: 98 %
Brady Statistic RV Percent Paced: 99 %
Date Time Interrogation Session: 20221215020014
Implantable Lead Implant Date: 20130716
Implantable Lead Implant Date: 20220317
Implantable Lead Location: 753859
Implantable Lead Location: 753860
Implantable Lead Model: 1948
Implantable Lead Model: 5076
Implantable Pulse Generator Implant Date: 20220317
Lead Channel Impedance Value: 400 Ohm
Lead Channel Impedance Value: 580 Ohm
Lead Channel Pacing Threshold Amplitude: 0.5 V
Lead Channel Pacing Threshold Amplitude: 0.5 V
Lead Channel Pacing Threshold Pulse Width: 0.5 ms
Lead Channel Pacing Threshold Pulse Width: 0.5 ms
Lead Channel Sensing Intrinsic Amplitude: 1.9 mV
Lead Channel Sensing Intrinsic Amplitude: 11.7 mV
Lead Channel Setting Pacing Amplitude: 0.75 V
Lead Channel Setting Pacing Amplitude: 2 V
Lead Channel Setting Pacing Pulse Width: 0.5 ms
Lead Channel Setting Sensing Sensitivity: 7 mV
Pulse Gen Model: 2272
Pulse Gen Serial Number: 3908614

## 2021-05-09 MED ORDER — SODIUM CHLORIDE 0.9% FLUSH: 3.0000 mL | Freq: Two times a day (BID) | INTRAVENOUS | Status: DC

## 2021-05-09 NOTE — Progress Notes (Signed)
Cardiology Office Note    Date:  05/16/2021   ID:  Cameron [name], DOB 11-Mar-1941, MRN [id number]  PCP:  [name], MD  Cardiologist:  [name], MD  EP: [name]  56-monthfollow-up evaluation  History of Present Illness:  Cameron HASSELLis a 80y.o. 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  [facility], Dr. TRadford Paxwith reference to obstructive sleep apnea, and Dr. ARayann Hemanfor his EP and pacemaker.  He also has seen Dr. RLubertha Sayresbecause of increasing shortness of breath.  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  He was also found to have aortic atherosclerosis with coronary calcification and enlarged pulmonary arteries with a peak PA pressure at 55 mm.  He was referred to Dr. MAundra Dubinwho performed a diagnostic cardiac catheterization on August 11, 2017.  He was found to have normal left and right heart filling pressures.  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  He was found to have severe disease in his proximal circumflex, proximal OM1 proximal diagonal 3 and was felt that his dyspnea was the result of combination of parenchymal lung disease and coronary artery disease.  I was asked to perform invention to this bifurcation stenosis of his circumflex and OM1 vessel.  His procedure was difficult but successful with an excellent result in a 2.5 x 15 mm Resolute stent was inserted into the OM 1 vessel at the ostium and a 3.0 x 18 mm Resolute DES stent was inserted from the proximal circumflex into the mid AV groove circumflex beyond the OM1  vessel.  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  That was the only time that I had seen the patient.  Apparently, the patient was recently seen by Dr. ARayann Hemanwho recommended he see me for his general cardiology care.  He presents for evaluation.    Since his intervention, he denies any anginal type symptoms.  He had noticed a rare episode of dizziness.  He has obstructive sleep apnea and has been on CPAP therapy for 2 months and was followed by Dr. TRadford Paxfor this.    He underwent carotid ultrasound imaging in June 2019 which showed minimal to moderate bilateral atherosclerotic plaque.  A head CT without contrast did not show any evidence of acute intracranial abnormality and showed mild atrophy and chronic small vessel white matter ischemic changes.  He has been evaluated by Dr. ARayann Hemanwho follows his pacemaker.   He was evaluated by HAlmyra Deforest PA with several occasions and was last seen in January 2021.  That time, he he had mild shortness of breath symptoms only when he performs strenuous activity.  He did not have any anginal type chest pain.  He was having lower extremity mild edema and he was given a prescription for Lasix to take on an as-needed basis.  I evaluated him in a telemedicine visit on July 05, 2019.  He has continued to be followed by Dr. JFulton Reekfor his primary care.  He is scheduled to undergo a breathing test with Dr. [name]  He has noticed his blood pressure to be elevated  as well as his heart rate in the upper 90s.  At that time, lisinopril was titrated to 20 mg and Bystolic was increased. He denies any anginal symptoms.  He has continued to use CPAP.  A download was obtained from April 02, 2019 through June 30, 2019 and he is meeting compliance with 97% of usage days.  Average CPAP use is 7 hours and 6 minutes.  At his set pressure of 7 cm, AHI is excellent at 1.3.   He was seen by [name], PA on September 05, 2019  his blood pressure was improved.  He  had presented to the emergency room in March 2021 with elevated potassium.   I saw him in September 2021.  He was not having any anginal symptomatology.  Blood pressure continued to be elevated despite taking lisinopril 20 mg and Bystolic 10 mg and I recommended further titration of lisinopril to 30 mg.  He has continued to to be evaluated by [name] for his interstitial lung disease.  He  has noticed further blood pressure elevation despite taking Bystolic 10 mg, lisinopril 20 mg.  He has a prescription for furosemide but rarely takes this.  He continues to be on DAPT with aspirin/Plavix.  He is on Esbriet for his interstitial lung disease.  He is diabetic on glimepiride in addition to Metformin.  He was recently started on a prednisone taper.  He continues to use CPAP with excellent compliance.    I last saw him on  August 08 2020 and since his prior evaluation he was evaluated by [name] and is in need for pacemaker replacement as well as removal and replacement of his atrial lead.  He has continued to walk at least 1 mile 3 days/week.  He has continued to use CPAP and a download was obtained from February 14 through August 07, 2020 which continues to show excellent compliance with average use of 6 hours and 7 minutes per night.  At 7 cm water pressure AHI is excellent at 1.3/h.  He denies any anginal symptoms.  His blood pressure improved with his medication adjustments from his last office visit.    Since my last evaluation, he was seen by Coletta Memos, NP in August 2022 and he was started on diltiazem 120 mg daily for increased palpitations.  He was recently evaluated by [name] on April 12, 2021 and it was felt at that time that the patient was in atrial flutter for approximately 2 weeks duration.  During that visit he was able to be paced to terminate his atrial flutter with an NIPS and with atrial pacing at 180 ms his atrial flutter terminated.  He continues to see [name] for  his interstitial lung disease.  His pulmonary function studies have indicated progression compared to 2 years previously.  He was recently felt by [name] that he may benefit from repeat right heart catheterization to further evaluate his pulmonary hypertension.  He has continued to be on Esbriet.  Presently he denies any anginal symptoms.  His heart rate has been stable.  He has been using CPAP therapy.  He underwent a follow-up echo Doppler study April 08, 2021 which showed EF at 60 to 65% and calculated at 61% by 3D volume.  There was grade 2 diastolic dysfunction and mild LVH.  There was moderate elevation of pulmonary artery systolic pressure 78.2 mmHg.  There was moderate biatrial enlargement, mild to moderate mitral regurgitation and moderate mitral annular calcification.  There  was aortic sclerosis without stenosis.  A small PFO cannot be completely excluded.  He presents for evaluation.   Past Medical History:  Diagnosis Date   Appendicitis    Atrial fibrillation ([place]) 12/12/2013   BPH (benign prostatic hypertrophy)    CAD (coronary artery disease) 12/2015   Cath by Dr [name] reveals distal and small vessel CAD.  Medical therapy advised.   Chest pain 12/03/2015   CHF (congestive heart failure) (HCC)    Complete heart block ([place])    s/p PPM   Coronary artery disease    Coronary artery disease involving native coronary artery of native heart with unstable angina pectoris ([place]) 08/11/2017   History of blood transfusion 1968   "probably; related [place]   History of kidney stones    History of SCC (squamous cell carcinoma) of skin 07/24/2020   right upper arm/excision   Hyperglycemia 11/05/2013   Hyperlipidemia 11/05/2013   Hypertension    Hypothyroidism    Hypothyroidism, unspecified 11/05/2013   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  AHI was  20/h while supine.  Oxygen saturations dropped to 87%.  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   Rheumatoid arthritis ([place])    "hands" (08/11/2017)   Type II diabetes mellitus ([place])     Past Surgical History:  Procedure Laterality Date   BACK SURGERY     CARDIAC CATHETERIZATION N/A 12/28/2015   Procedure: Left Heart Cath and Coronary Angiography;  Surgeon: Belva Crome, MD;  Location: [facility];  Service: Cardiovascular;  Laterality: N/A;   CATARACT EXTRACTION W/ INTRAOCULAR LENS  IMPLANT, BILATERAL Bilateral    CORONARY ANGIOPLASTY WITH STENT PLACEMENT  08/11/2017   "2 stents"   CORONARY STENT INTERVENTION N/A 08/11/2017   Procedure: CORONARY STENT INTERVENTION;  Surgeon: Troy Sine, MD;  Location: [facility];  Service: Cardiovascular;  Laterality: N/A;   CYSTOSCOPY W/ STONE MANIPULATION     INGUINAL HERNIA REPAIR Left    INSERT / REPLACE / REMOVE PACEMAKER  12/09/2011   SJM Accent DR RF implanted by DR Allred for complete heart block and syncope   JOINT REPLACEMENT     LAPAROSCOPIC CHOLECYSTECTOMY     LEAD REVISION/REPAIR N/A 08/09/2020   Procedure: LEAD REVISION/REPAIR;  Surgeon: [name], MD;  Location: [facility];  Service: Cardiovascular;  Laterality: N/A;   LITHOTRIPSY     LUMBAR [place]     "removed arthritis and spurs"   PERMANENT PACEMAKER INSERTION N/A 12/09/2011   Procedure: PERMANENT PACEMAKER INSERTION;  Surgeon: [name], MD;  Location[facility];  Service: Cardiovascular;  Laterality: N/A;   PPM GENERATOR CHANGEOUT N/A 08/09/2020   Procedure: PPM GENERATOR CHANGEOUT;  Surgeon: [name], MD;  Location: [facility];  Service: Cardiovascular;  Laterality: N/A;   REPLACEMENT TOTAL KNEE Right    RIGHT/LEFT HEART CATH AND CORONARY ANGIOGRAPHY N/A 08/11/2017   Procedure: RIGHT/LEFT HEART CATH AND CORONARY ANGIOGRAPHY;  Surgeon: Larey Dresser, MD;  Location: [facility];  Service: Cardiovascular;  Laterality: N/A;   TRANSURETHRAL RESECTION OF PROSTATE  2017/2018    Current Medications: Outpatient Medications Prior to Visit  Medication Sig Dispense Refill   acetaminophen (TYLENOL) 500 MG tablet Take 500 mg  by mouth every 6 (six) hours as needed for moderate pain.     aspirin 81 MG EC tablet Take 81 mg by mouth daily.     [employer]S TRIPLE MAGNESIUM COMPLEX PO Take by mouth.     diltiazem (CARDIZEM CD) 120 MG 24 hr capsule Take 1 capsule (120 mg total) by mouth daily. 90 capsule 3   diltiazem (TIAZAC) 120 MG 24 hr capsule Take 120 mg by mouth.     ESBRIET 267 MG TABS TAKE 3 TABLETS THREE TIMES A DAY WITH MEALS (MAINTENANCE DOSE, START AFTER COMPLETED STARTER DOSE) 270 tablet 11   gabapentin (NEURONTIN) 300 MG capsule Take 300 mg by mouth at bedtime.     glimepiride (AMARYL) 2 MG tablet Take 2 mg by mouth every morning.     leflunomide (ARAVA) 10 MG tablet Take 10 mg by mouth daily.     metFORMIN (GLUCOPHAGE) 500 MG tablet Take 500 mg by mouth daily. 2 tablets daily     nebivolol (BYSTOLIC) 5 MG tablet Take 5 mg by mouth daily.     nitroGLYCERIN (NITROSTAT) 0.3 MG SL tablet Place under the tongue.     Omega-3 Fatty Acids (OMEGA-3 FISH OIL PO) Take 1 capsule by mouth daily.     pravastatin (PRAVACHOL) 20 MG tablet Take 1 tablet (20 mg total) by mouth every evening. *NEEDS OFFICE VISIT FOR FURTHER REFILLS* 90 tablet 0   telmisartan (MICARDIS) 80 MG tablet Take 80 mg by mouth daily.     thyroid (ARMOUR) 90 MG tablet Take 90 mg by mouth every morning.      Trospium Chloride 60 MG CP24 TAKE 1 CAPSULE BY MOUTH DAILY 30 capsule 2   TURMERIC PO Take 1 capsule by mouth daily.     XARELTO 20 MG TABS tablet TAKE ONE TABLET BY MOUTH EVERY DAY WITH SUPPERDISCONTINUE CLOPIDROGEL 30 tablet 3   Facility-Administered Medications Prior to Visit  Medication Dose Route Frequency Provider Last Rate Last Admin   ondansetron (ZOFRAN) 4 mg in  sodium chloride 0.9 % 50 mL IVPB  4 mg Intravenous Q6H PRN Ashok Pall, MD         Allergies:   Ace inhibitors, Lisinopril, and Celecoxib   Social History   Socioeconomic History   Marital status: Married    Spouse name: Not on file   Number of children: Not on file   Years of education: Not on file   Highest education level: Not on file  Occupational History   Not on file  Tobacco Use   Smoking status: Former    Packs/day: 1.00    Years: 5.00    Pack years: 5.00    Types: Cigarettes    Quit date: 07/10/1972    Years since quitting: 48.8   Smokeless tobacco: Former    Types: Chew    Quit date: 1975  Vaping Use   Vaping Use: Never used  Substance and Sexual Activity   Alcohol use: Yes    Alcohol/week: 1.0 standard drink    Types: 1 Glasses of wine per week   Drug use: No   Sexual activity: Not Currently  Other Topics Concern   Not on file  Social History Narrative   Not on file   Social Determinants of Health   Financial Resource Strain: Not on file  Food Insecurity: Not on file  Transportation Needs: Not on file  Physical Activity: Not on file  Stress: Not on file  Social Connections: Not on file  Family History:  The patient's family history includes Alzheimer's disease in his paternal grandmother and sister; Arthritis in his maternal grandfather; Bone cancer in his father; Heart attack in his paternal uncle; Lung cancer in his paternal grandfather; Pancreatic cancer in his mother.   ROS General: Negative; No fevers, chills, or night sweats;  HEENT: Negative; No changes in vision or hearing, sinus congestion, difficulty swallowing Pulmonary: Interstitial lung disease, reported exposure to agent orange Cardiovascular: See HPI GI: Negative; No nausea, vomiting, diarrhea, or abdominal pain GU: BPH followed by [facility] urology Musculoskeletal: Negative; no myalgias, joint pain, or weakness Hematologic/Oncology: Negative; no easy bruising,  bleeding Endocrine: Negative; no heat/cold intolerance; no diabetes Neuro: Negative; no changes in balance, headaches Skin: Negative; No rashes or skin lesions Psychiatric: Negative; No behavioral problems, depression Sleep: OSA on CPAP, no breakthrough snoring.  No daytime sleepiness. Other comprehensive 14 point system review is negative.   PHYSICAL EXAM:   VS:  BP (!) 138/58    Pulse 67    Ht 5' 7" (1.702 m)    Wt 193 lb 12.8 oz (87.9 kg)    SpO2 95%    BMI 30.35 kg/m    Repeat blood pressure by me was 128/70.    Wt Readings from Last 3 Encounters:  05/09/21 193 lb 12.8 oz (87.9 kg)  04/12/21 194 lb 1.6 oz (88 kg)  04/09/21 193 lb (87.5 kg)    General: Alert, oriented, no distress.  Skin: normal turgor, no rashes, warm and dry HEENT: Normocephalic, atraumatic. Pupils equal round and reactive to light; sclera anicteric; extraocular muscles intact;  Nose without nasal septal hypertrophy Mouth/Parynx benign; Mallinpatti scale 3 Neck: No JVD, no carotid bruits; normal carotid upstroke Lungs: clear to ausculatation and percussion; no wheezing or rales Chest wall: without tenderness to palpitation Heart: PMI not displaced, RRR, s1 s2 normal, 1/6 systolic murmur, no diastolic murmur, no rubs, gallops, thrills, or heaves Abdomen: soft, nontender; no hepatosplenomehaly, BS+; abdominal aorta nontender and not dilated by palpation. Back: no CVA tenderness Pulses 2+ Musculoskeletal: full range of motion, normal strength, no joint deformities Extremities: no clubbing cyanosis or edema, Homan's sign negative  Neurologic: grossly nonfocal; Cranial nerves grossly wnl Psychologic: Normal mood and affect   Studies/Labs Reviewed:   May 09, 2021 ECG (independently read by me): AV paced at 67  August 08, 2020  ECG (independently read by me): AV paced at 55  September 2021 ECG (independently read by me): AV paced rhythm at 61 bpm  Recent Labs: BMP Latest Ref Rng & Units 07/25/2020  08/02/2019 08/01/2019  Glucose 65 - 99 mg/dL 99 70 200(H)  BUN 8 - 27 mg/dL 30(H) 28(H) 26  Creatinine 0.76 - 1.27 mg/dL 1.27 1.26(H) 1.04  BUN/Creat Ratio 10 - 24 24 - 25(H)  Sodium 134 - 144 mmol/L 142 137 139  Potassium 3.5 - 5.2 mmol/L 5.5(H) 5.2(H) 6.3(HH)  Chloride 96 - 106 mmol/L 105 104 103  CO2 20 - 29 mmol/L _0 Calcium 8.6 - 10.2 mg/dL 10.0 9.4 10.1     Hepatic Function Latest Ref Rng & Units 02/08/2021 06/29/2020 11/07/2019  Total Protein - CANCELED 7.4 7.3  Albumin 3.5 - 5.2 g/dL - 4.3 4.4  AST 0 - 37 U/L - 17 19  ALT 0 - 53 U/L - 14 17  Alk Phosphatase 39 - 117 U/L - 67 71  Total Bilirubin 0.2 - 1.2 mg/dL - 0.4 0.4  Bilirubin, Direct 0.0 - 0.3 mg/dL - 0.1 0.1  CBC Latest Ref Rng & Units 07/25/2020 08/02/2019 11/10/2018  WBC 3.4 - 10.8 x10E3/uL 7.9 9.2 8.2  Hemoglobin 13.0 - 17.7 g/dL 14.3 13.4 13.0  Hematocrit 37.5 - 51.0 % 42.3 40.7 39.7  Platelets 150 - 450 x10E3/uL 195 188 182   Lab Results  Component Value Date   MCV 90 07/25/2020   MCV 92.7 08/02/2019   MCV 94.3 11/10/2018   Lab Results  Component Value Date   TSH 0.174 (L) 09/13/2016   No results found for: HGBA1C   BNP No results found for: BNP  ProBNP    Component Value Date/Time   PROBNP 557 (H) 04/02/2017 1204     Lipid Panel  No results found for: CHOL, TRIG, HDL, CHOLHDL, VLDL, LDLCALC, LDLDIRECT, LABVLDL   RADIOLOGY: CUP PACEART REMOTE DEVICE CHECK  Result Date: 05/09/2021 Scheduled remote reviewed. Normal device function.  Next remote 91 days. LR     Additional studies/ records that were reviewed today include:   ECHO 1/12021  MPRESSIONS   1. Left ventricular ejection fraction, by visual estimation, is 50 to  55%. The left ventricle has normal function. There is no left ventricular  hypertrophy.   2. Elevated left atrial pressure.   3. Left ventricular diastolic parameters are consistent with Grade II  diastolic dysfunction (pseudonormalization).   4. The left ventricle  has no regional wall motion abnormalities.   5. Global right ventricle has normal systolic function.The right  ventricular size is mildly enlarged.   6. Left atrial size was normal.   7. Right atrial size was normal.   8. Mild mitral annular calcification.   9. The mitral valve is normal in structure. Mild mitral valve  regurgitation. No evidence of mitral stenosis.  10. The tricuspid valve is normal in structure.  11. The aortic valve is tricuspid. Aortic valve regurgitation is trivial.  Mild aortic valve sclerosis without stenosis.  12. The pulmonic valve was normal in structure. Pulmonic valve  regurgitation is trivial.  13. Moderately elevated pulmonary artery systolic pressure.  14. A pacer wire is visualized.  15. The inferior vena cava is normal in size with greater than 50%  respiratory variability, suggesting right atrial pressure of 3 mmHg.  16. Normal LV systolic function; grade 2 diastolic dysfunction; trace AI;  mild MR; mild RVE; moderate pulmonary hypertension.   In comparison to the previous echocardiogram(s): 09/17/17 EF 55-60%. PA  pressure 45mHg.     ECHO: 04/08/2021 IMPRESSIONS   1. Left ventricular ejection fraction, by estimation, is 60 to 65%. Left  ventricular ejection fraction by 3D volume is 61 %. The left ventricle has  normal function. The left ventricle has no regional wall motion  abnormalities. There is mild left  ventricular hypertrophy. Left ventricular diastolic parameters are  consistent with Grade II diastolic dysfunction (pseudonormalization).   2. Right ventricular systolic function is normal. The right ventricular  size is normal. There is moderately elevated pulmonary artery systolic  pressure. The estimated right ventricular systolic pressure is 502.4mmHg.   3. Left atrial size was moderately dilated.   4. Right atrial size was moderately dilated.   5. The mitral valve is abnormal. Mild to moderate mitral valve  regurgitation. Moderate  mitral annular calcification.   6. The tricuspid valve is abnormal. Tricuspid valve regurgitation is  moderate.   7. The aortic valve is tricuspid. Aortic valve regurgitation is trivial.  Aortic valve sclerosis is present, with no evidence of aortic valve  stenosis. Aortic regurgitation PHT measures 594  msec.   8. Cannot exclude a small PFO.   Comparison(s): Changes from prior study are noted. 06/07/2019: LVEF 50-55%.   ASSESSMENT:    1. Coronary artery disease involving native coronary artery of native heart without angina pectoris   2. PAF (paroxysmal atrial fibrillation) ([place])   3. Essential hypertension   4. OSA on CPAP   5. Hyperlipidemia with target LDL less than 70   6. CHB (complete heart block) (HCC)   7. Pacemaker   8. ILD (interstitial lung disease) ([place])   9. Hypothyroidism, unspecified type     PLAN:  1. CAD: He is status post PCI with DES stenting to his left circumflex and OM1 vessel on August 12, 2019.  He continues to be asymptomatic without recurrent anginal symptomatology.  He is on diltiazem 120 mg, nebivolol 5 mg daily, aspirin in addition to telmisartan.  He continues to be without recurrent anginal symptomatology on aspirin, nebivolol 5 mg daily, pravastatin and telmisartan.  2.  Essential hypertension: Blood pressure today is controlled on telmisartan 80 mg, diltiazem 120 mg, and nebivolol 5 mg daily.  3.  Dyspnea: At cardiac catheterization in 2019 he had normal left and right filling pressures with mild pulmonary hypertension.  On echo Doppler study in January 2021 he had  grade 2 diastolic dysfunction, trace AI, mild MR, mild RVE and moderate pulmonary hypertension with estimated RV systolic pressure of 15.4 mm.  He has interstitial lung disease followed by [name].  I reviewed his most recent echo Doppler study from April 08, 2021 which shows normal systolic function with EF at 60 to 65% and grade 2 diastolic dysfunction.  Estimated pulmonary systolic  pressure is now further increased at 54.8 mmHg.  There is evidence for moderate biatrial enlargement, mild to moderate mitral regurgitation with mitral annular calcification and aortic valve sclerosis.  Per request of [name], the patient will be scheduled for a right heart catheterization after the New Year's and tentatively this was set up today to be done on May 29, 2021.  The patient is aware of the risk benefits of the procedure.  4.  Obstructive sleep apnea: He continues to use CPAP therapy with compliance.  Recent download showed AHI of 1.3.  No significant mask leak.  5.  Pacemaker: ECG shows AV paced rhythm.  Remote history of complete heart block.  He underwent pacemaker change out as well as new atrial lead placement by [name].  When most recently seen by [name] on April 12, 2021 he was in atrial flutter and he was successfully atrially paced back to sinus rhythm.  ECG today demonstrates AV paced rhythm.  6.  Hyperlipidemia: He continues to be on pravastatin 20 mg daily.  Target LDL less than 70.    7.  BPH:  followed by [facility] urology  8.  Type 2 diabetes mellitus: Stable on glimepiride 2 mg and metformin 500 mg twice a day  9.  Hypothyroidism: On Armour Thyroid 90 mg daily, followed by Dr. Doy Hutching.     Patient will be scheduled for outpatient right heart catheterization in May 29, 2021.  Medication Adjustments/Labs and Tests Ordered: Current medicines are reviewed at length with the patient today.  Concerns regarding medicines are outlined above.  Medication changes, Labs and Tests ordered today are listed in the Patient Instructions below. Patient Instructions  Medication Instructions:  Your physician recommends that you continue on your current medications as directed. Please refer to the Current Medication list given to you  today.   *If you need a refill on your cardiac medications before your next appointment, please call your pharmacy*   Lab  Work: Your physician recommends that you return for lab work in: 1 week prior to heart cath procedure  If you have labs (blood work) drawn today and your tests are completely normal, you will receive your results only by: [place] (if you have MyChart) OR A paper copy in the mail If you have any lab test that is abnormal or we need to change your treatment, we will call you to review the results.   Testing/Procedures: See below   Follow-Up: At [facility], you and your health needs are our priority.  As part of our continuing mission to provide you with exceptional heart care, we have created designated Provider Care Teams.  These Care Teams include your primary Cardiologist (physician) and Advanced Practice Providers (APPs -  Physician Assistants and Nurse Practitioners) who all work together to provide you with the care you need, when you need it.  We recommend signing up for the patient portal called "MyChart".  Sign up information is provided on this After Visit Summary.  MyChart is used to connect with patients for Virtual Visits (Telemedicine).  Patients are able to view lab/test results, encounter notes, upcoming appointments, etc.  Non-urgent messages can be sent to your provider as well.   To learn more about what you can do with MyChart, go to [email or web address].    Your next appointment:   3 month(s)  The format for your next appointment:   In Person  Provider:   [name], MD :1}    Other Instructions  [place] [address] Dept: [phone] Loc: [place]  05/09/2021  You are scheduled for a Cardiac Catheterization on Wednesday, January 4 with Dr. [name].  1. Please arrive at the [facility] (Main Entrance A) at [facility]: [address] [place], [place] [address] at 5:30 AM (This time is two hours  before your procedure to ensure your preparation). Free valet parking service is available.   Special note: Every effort is made to have your procedure done on time. Please understand that emergencies sometimes delay scheduled procedures.  2. Diet: Do not eat solid foods after midnight.  The patient may have clear liquids until 5am upon the day of the procedure.  3. Labs: You will need to have blood drawn 1 week prior to procedure    4. Medication instructions in preparation for your procedure:  Stop taking Xarelto (Rivaroxaban) on Monday, January 2.   Do not take Diabetes Med Glucophage (Metformin) on the day of the procedure and HOLD 48 HOURS AFTER THE PROCEDURE.  On the morning of your procedure, take your Aspirin and any morning medicines NOT listed above.  You may use sips of water.  5. Plan for one night stay--bring personal belongings. 6. Bring a current list of your medications and current insurance cards. 7. You MUST have a responsible person to drive you home. 8. Someone MUST be with you the first 24 hours after you arrive home or your discharge will be delayed. 9. Please wear clothes that are easy to get on and off and wear slip-on shoes.  Thank you for allowing [place] to care for you!   -- [facility]     Signed, [name], MD  05/16/2021 10:12  [facility] [address], [address], [place], [place]  [address] Phone: [phone]

## 2021-05-09 NOTE — Patient Instructions (Addendum)
Medication Instructions:  Your physician recommends that you continue on your current medications as directed. Please refer to the Current Medication list given to you today.   *If you need a refill on your cardiac medications before your next appointment, please call your pharmacy*   Lab Work: Your physician recommends that you return for lab work in: 1 week prior to heart cath procedure  If you have labs (blood work) drawn today and your tests are completely normal, you will receive your results only by: [place] (if you have MyChart) OR A paper copy in the mail If you have any lab test that is abnormal or we need to change your treatment, we will call you to review the results.   Testing/Procedures: See below   Follow-Up: At [facility], you and your health needs are our priority.  As part of our continuing mission to provide you with exceptional heart care, we have created designated Provider Care Teams.  These Care Teams include your primary Cardiologist (physician) and Advanced Practice Providers (APPs -  Physician Assistants and Nurse Practitioners) who all work together to provide you with the care you need, when you need it.  We recommend signing up for the patient portal called "MyChart".  Sign up information is provided on this After Visit Summary.  MyChart is used to connect with patients for Virtual Visits (Telemedicine).  Patients are able to view lab/test results, encounter notes, upcoming appointments, etc.  Non-urgent messages can be sent to your provider as well.   To learn more about what you can do with MyChart, go to [email or web address].    Your next appointment:   3 month(s)  The format for your next appointment:   In Person  Provider:   [name], MD :1}    Other Instructions  [place] [address] Dept: [phone] Loc:  [place]  05/09/2021  You are scheduled for a Cardiac Catheterization on Wednesday, January 4 with Dr. [name].  1. Please arrive at the [facility] (Main Entrance A) at [facility]: [address] [place],  [address] at 5:30 AM (This time is two hours before your procedure to ensure your preparation). Free valet parking service is available.   Special note: Every effort is made to have your procedure done on time. Please understand that emergencies sometimes delay scheduled procedures.  2. Diet: Do not eat solid foods after midnight.  The patient may have clear liquids until 5am upon the day of the procedure.  3. Labs: You will need to have blood drawn 1 week prior to procedure    4. Medication instructions in preparation for your procedure:  Stop taking Xarelto (Rivaroxaban) on Monday, January 2.   Do not take Diabetes Med Glucophage (Metformin) on the day of the procedure and HOLD 48 HOURS AFTER THE PROCEDURE.  On the morning of your procedure, take your Aspirin and any morning medicines NOT listed above.  You may use sips of water.  5. Plan for one night stay--bring personal belongings. 6. Bring a current list of your medications and current insurance cards. 7. You MUST have a responsible person to drive you home. 8. Someone MUST be with you the first 24 hours after you arrive home or your discharge will be delayed. 9. Please wear clothes that are easy to get on and off and wear slip-on shoes.  Thank you for allowing  [place] to care for you!   -- [facility]

## 2021-05-09 NOTE — H&P (View-Only) (Signed)
Cardiology Office Note    Date:  05/16/2021   ID:  Cameron [name], DOB 11-Mar-1941, MRN [id number]  PCP:  [name], MD  Cardiologist:  [name], MD  EP: [name]  56-monthfollow-up evaluation  History of Present Illness:  Cameron HASSELLis a 80y.o. 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  [facility], Dr. TRadford Paxwith reference to obstructive sleep apnea, and Dr. ARayann Hemanfor his EP and pacemaker.  He also has seen Dr. RLubertha Sayresbecause of increasing shortness of breath.  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  He was also found to have aortic atherosclerosis with coronary calcification and enlarged pulmonary arteries with a peak PA pressure at 55 mm.  He was referred to Dr. MAundra Dubinwho performed a diagnostic cardiac catheterization on August 11, 2017.  He was found to have normal left and right heart filling pressures.  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  He was found to have severe disease in his proximal circumflex, proximal OM1 proximal diagonal 3 and was felt that his dyspnea was the result of combination of parenchymal lung disease and coronary artery disease.  I was asked to perform invention to this bifurcation stenosis of his circumflex and OM1 vessel.  His procedure was difficult but successful with an excellent result in a 2.5 x 15 mm Resolute stent was inserted into the OM 1 vessel at the ostium and a 3.0 x 18 mm Resolute DES stent was inserted from the proximal circumflex into the mid AV groove circumflex beyond the OM1  vessel.  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  That was the only time that I had seen the patient.  Apparently, the patient was recently seen by Dr. ARayann Hemanwho recommended he see me for his general cardiology care.  He presents for evaluation.    Since his intervention, he denies any anginal type symptoms.  He had noticed a rare episode of dizziness.  He has obstructive sleep apnea and has been on CPAP therapy for 2 months and was followed by Dr. TRadford Paxfor this.    He underwent carotid ultrasound imaging in June 2019 which showed minimal to moderate bilateral atherosclerotic plaque.  A head CT without contrast did not show any evidence of acute intracranial abnormality and showed mild atrophy and chronic small vessel white matter ischemic changes.  He has been evaluated by Dr. ARayann Hemanwho follows his pacemaker.   He was evaluated by HAlmyra Deforest PA with several occasions and was last seen in January 2021.  That time, he he had mild shortness of breath symptoms only when he performs strenuous activity.  He did not have any anginal type chest pain.  He was having lower extremity mild edema and he was given a prescription for Lasix to take on an as-needed basis.  I evaluated him in a telemedicine visit on July 05, 2019.  He has continued to be followed by Dr. JFulton Reekfor his primary care.  He is scheduled to undergo a breathing test with Dr. [name]  He has noticed his blood pressure to be elevated  as well as his heart rate in the upper 90s.  At that time, lisinopril was titrated to 20 mg and Bystolic was increased. He denies any anginal symptoms.  He has continued to use CPAP.  A download was obtained from April 02, 2019 through June 30, 2019 and he is meeting compliance with 97% of usage days.  Average CPAP use is 7 hours and 6 minutes.  At his set pressure of 7 cm, AHI is excellent at 1.3.   He was seen by [name], PA on September 05, 2019  his blood pressure was improved.  He  had presented to the emergency room in March 2021 with elevated potassium.   I saw him in September 2021.  He was not having any anginal symptomatology.  Blood pressure continued to be elevated despite taking lisinopril 20 mg and Bystolic 10 mg and I recommended further titration of lisinopril to 30 mg.  He has continued to to be evaluated by [name] for his interstitial lung disease.  He  has noticed further blood pressure elevation despite taking Bystolic 10 mg, lisinopril 20 mg.  He has a prescription for furosemide but rarely takes this.  He continues to be on DAPT with aspirin/Plavix.  He is on Esbriet for his interstitial lung disease.  He is diabetic on glimepiride in addition to Metformin.  He was recently started on a prednisone taper.  He continues to use CPAP with excellent compliance.    I last saw him on  August 08 2020 and since his prior evaluation he was evaluated by [name] and is in need for pacemaker replacement as well as removal and replacement of his atrial lead.  He has continued to walk at least 1 mile 3 days/week.  He has continued to use CPAP and a download was obtained from February 14 through August 07, 2020 which continues to show excellent compliance with average use of 6 hours and 7 minutes per night.  At 7 cm water pressure AHI is excellent at 1.3/h.  He denies any anginal symptoms.  His blood pressure improved with his medication adjustments from his last office visit.    Since my last evaluation, he was seen by Coletta Memos, NP in August 2022 and he was started on diltiazem 120 mg daily for increased palpitations.  He was recently evaluated by [name] on April 12, 2021 and it was felt at that time that the patient was in atrial flutter for approximately 2 weeks duration.  During that visit he was able to be paced to terminate his atrial flutter with an NIPS and with atrial pacing at 180 ms his atrial flutter terminated.  He continues to see [name] for  his interstitial lung disease.  His pulmonary function studies have indicated progression compared to 2 years previously.  He was recently felt by [name] that he may benefit from repeat right heart catheterization to further evaluate his pulmonary hypertension.  He has continued to be on Esbriet.  Presently he denies any anginal symptoms.  His heart rate has been stable.  He has been using CPAP therapy.  He underwent a follow-up echo Doppler study April 08, 2021 which showed EF at 60 to 65% and calculated at 61% by 3D volume.  There was grade 2 diastolic dysfunction and mild LVH.  There was moderate elevation of pulmonary artery systolic pressure 78.2 mmHg.  There was moderate biatrial enlargement, mild to moderate mitral regurgitation and moderate mitral annular calcification.  There  was aortic sclerosis without stenosis.  A small PFO cannot be completely excluded.  He presents for evaluation.   Past Medical History:  Diagnosis Date   Appendicitis    Atrial fibrillation ([place]) 12/12/2013   BPH (benign prostatic hypertrophy)    CAD (coronary artery disease) 12/2015   Cath by Dr [name] reveals distal and small vessel CAD.  Medical therapy advised.   Chest pain 12/03/2015   CHF (congestive heart failure) (HCC)    Complete heart block ([place])    s/p PPM   Coronary artery disease    Coronary artery disease involving native coronary artery of native heart with unstable angina pectoris ([place]) 08/11/2017   History of blood transfusion 1968   "probably; related [place]   History of kidney stones    History of SCC (squamous cell carcinoma) of skin 07/24/2020   right upper arm/excision   Hyperglycemia 11/05/2013   Hyperlipidemia 11/05/2013   Hypertension    Hypothyroidism    Hypothyroidism, unspecified 11/05/2013   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  AHI was  20/h while supine.  Oxygen saturations dropped to 87%.  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   Rheumatoid arthritis ([place])    "hands" (08/11/2017)   Type II diabetes mellitus ([place])     Past Surgical History:  Procedure Laterality Date   BACK SURGERY     CARDIAC CATHETERIZATION N/A 12/28/2015   Procedure: Left Heart Cath and Coronary Angiography;  Surgeon: Belva Crome, MD;  Location: [facility];  Service: Cardiovascular;  Laterality: N/A;   CATARACT EXTRACTION W/ INTRAOCULAR LENS  IMPLANT, BILATERAL Bilateral    CORONARY ANGIOPLASTY WITH STENT PLACEMENT  08/11/2017   "2 stents"   CORONARY STENT INTERVENTION N/A 08/11/2017   Procedure: CORONARY STENT INTERVENTION;  Surgeon: Troy Sine, MD;  Location: [facility];  Service: Cardiovascular;  Laterality: N/A;   CYSTOSCOPY W/ STONE MANIPULATION     INGUINAL HERNIA REPAIR Left    INSERT / REPLACE / REMOVE PACEMAKER  12/09/2011   SJM Accent DR RF implanted by DR Allred for complete heart block and syncope   JOINT REPLACEMENT     LAPAROSCOPIC CHOLECYSTECTOMY     LEAD REVISION/REPAIR N/A 08/09/2020   Procedure: LEAD REVISION/REPAIR;  Surgeon: [name], MD;  Location: [facility];  Service: Cardiovascular;  Laterality: N/A;   LITHOTRIPSY     LUMBAR [place]     "removed arthritis and spurs"   PERMANENT PACEMAKER INSERTION N/A 12/09/2011   Procedure: PERMANENT PACEMAKER INSERTION;  Surgeon: [name], MD;  Location: [facility];  Service: Cardiovascular;  Laterality: N/A;   PPM GENERATOR CHANGEOUT N/A 08/09/2020   Procedure: PPM GENERATOR CHANGEOUT;  Surgeon: [name], MD;  Location: [place] [facility];  Service: Cardiovascular;  Laterality: N/A;   REPLACEMENT TOTAL KNEE Right    RIGHT/LEFT HEART CATH AND CORONARY ANGIOGRAPHY N/A 08/11/2017   Procedure: RIGHT/LEFT HEART CATH AND CORONARY ANGIOGRAPHY;  Surgeon: Larey Dresser, MD;  Location: [facility];  Service: Cardiovascular;  Laterality: N/A;   TRANSURETHRAL RESECTION OF PROSTATE  2017/2018    Current Medications: Outpatient Medications Prior to Visit  Medication Sig Dispense Refill   acetaminophen (TYLENOL) 500 MG tablet Take 500 mg  by mouth every 6 (six) hours as needed for moderate pain.     aspirin 81 MG EC tablet Take 81 mg by mouth daily.     CVS TRIPLE MAGNESIUM COMPLEX PO Take by mouth.     diltiazem (CARDIZEM CD) 120 MG 24 hr capsule Take 1 capsule (120 mg total) by mouth daily. 90 capsule 3   diltiazem (TIAZAC) 120 MG 24 hr capsule Take 120 mg by mouth.     ESBRIET 267 MG TABS TAKE 3 TABLETS THREE TIMES A DAY WITH MEALS (MAINTENANCE DOSE, START AFTER COMPLETED STARTER DOSE) 270 tablet 11   gabapentin (NEURONTIN) 300 MG capsule Take 300 mg by mouth at bedtime.     glimepiride (AMARYL) 2 MG tablet Take 2 mg by mouth every morning.     leflunomide (ARAVA) 10 MG tablet Take 10 mg by mouth daily.     metFORMIN (GLUCOPHAGE) 500 MG tablet Take 500 mg by mouth daily. 2 tablets daily     nebivolol (BYSTOLIC) 5 MG tablet Take 5 mg by mouth daily.     nitroGLYCERIN (NITROSTAT) 0.3 MG SL tablet Place under the tongue.     Omega-3 Fatty Acids (OMEGA-3 FISH OIL PO) Take 1 capsule by mouth daily.     pravastatin (PRAVACHOL) 20 MG tablet Take 1 tablet (20 mg total) by mouth every evening. *NEEDS OFFICE VISIT FOR FURTHER REFILLS* 90 tablet 0   telmisartan (MICARDIS) 80 MG tablet Take 80 mg by mouth daily.     thyroid (ARMOUR) 90 MG tablet Take 90 mg by mouth every morning.      Trospium Chloride 60 MG CP24 TAKE 1 CAPSULE BY MOUTH DAILY 30 capsule 2   TURMERIC PO Take 1 capsule by mouth daily.     XARELTO 20 MG TABS tablet TAKE ONE TABLET BY MOUTH EVERY DAY WITH SUPPERDISCONTINUE CLOPIDROGEL 30 tablet 3   Facility-Administered Medications Prior to Visit  Medication Dose Route Frequency Provider Last Rate Last Admin   ondansetron (ZOFRAN) 4 mg in  sodium chloride 0.9 % 50 mL IVPB  4 mg Intravenous Q6H PRN Ashok Pall, MD         Allergies:   Ace inhibitors, Lisinopril, and Celecoxib   Social History   Socioeconomic History   Marital status: Married    Spouse name: Not on file   Number of children: Not on file   Years of education: Not on file   Highest education level: Not on file  Occupational History   Not on file  Tobacco Use   Smoking status: Former    Packs/day: 1.00    Years: 5.00    Pack years: 5.00    Types: Cigarettes    Quit date: 07/10/1972    Years since quitting: 48.8   Smokeless tobacco: Former    Types: Chew    Quit date: 1975  Vaping Use   Vaping Use: Never used  Substance and Sexual Activity   Alcohol use: Yes    Alcohol/week: 1.0 standard drink    Types: 1 Glasses of wine per week   Drug use: No   Sexual activity: Not Currently  Other Topics Concern   Not on file  Social History Narrative   Not on file   Social Determinants of Health   Financial Resource Strain: Not on file  Food Insecurity: Not on file  Transportation Needs: Not on file  Physical Activity: Not on file  Stress: Not on file  Social Connections: Not on file  Family History:  The patient's family history includes Alzheimer's disease in his paternal grandmother and sister; Arthritis in his maternal grandfather; Bone cancer in his father; Heart attack in his paternal uncle; Lung cancer in his paternal grandfather; Pancreatic cancer in his mother.   ROS General: Negative; No fevers, chills, or night sweats;  HEENT: Negative; No changes in vision or hearing, sinus congestion, difficulty swallowing Pulmonary: Interstitial lung disease, reported exposure to agent orange Cardiovascular: See HPI GI: Negative; No nausea, vomiting, diarrhea, or abdominal pain GU: BPH followed by [facility] urology Musculoskeletal: Negative; no myalgias, joint pain, or weakness Hematologic/Oncology: Negative; no easy bruising,  bleeding Endocrine: Negative; no heat/cold intolerance; no diabetes Neuro: Negative; no changes in balance, headaches Skin: Negative; No rashes or skin lesions Psychiatric: Negative; No behavioral problems, depression Sleep: OSA on CPAP, no breakthrough snoring.  No daytime sleepiness. Other comprehensive 14 point system review is negative.   PHYSICAL EXAM:   VS:  BP (!) 138/58    Pulse 67    Ht 5' 7" (1.702 m)    Wt 193 lb 12.8 oz (87.9 kg)    SpO2 95%    BMI 30.35 kg/m    Repeat blood pressure by me was 128/70.    Wt Readings from Last 3 Encounters:  05/09/21 193 lb 12.8 oz (87.9 kg)  04/12/21 194 lb 1.6 oz (88 kg)  04/09/21 193 lb (87.5 kg)    General: Alert, oriented, no distress.  Skin: normal turgor, no rashes, warm and dry HEENT: Normocephalic, atraumatic. Pupils equal round and reactive to light; sclera anicteric; extraocular muscles intact;  Nose without nasal septal hypertrophy Mouth/Parynx benign; Mallinpatti scale 3 Neck: No JVD, no carotid bruits; normal carotid upstroke Lungs: clear to ausculatation and percussion; no wheezing or rales Chest wall: without tenderness to palpitation Heart: PMI not displaced, RRR, s1 s2 normal, 1/6 systolic murmur, no diastolic murmur, no rubs, gallops, thrills, or heaves Abdomen: soft, nontender; no hepatosplenomehaly, BS+; abdominal aorta nontender and not dilated by palpation. Back: no CVA tenderness Pulses 2+ Musculoskeletal: full range of motion, normal strength, no joint deformities Extremities: no clubbing cyanosis or edema, Homan's sign negative  Neurologic: grossly nonfocal; Cranial nerves grossly wnl Psychologic: Normal mood and affect   Studies/Labs Reviewed:   May 09, 2021 ECG (independently read by me): AV paced at 67  August 08, 2020  ECG (independently read by me): AV paced at 55  September 2021 ECG (independently read by me): AV paced rhythm at 61 bpm  Recent Labs: BMP Latest Ref Rng & Units 07/25/2020  08/02/2019 08/01/2019  Glucose 65 - 99 mg/dL 99 70 200(H)  BUN 8 - 27 mg/dL 30(H) 28(H) 26  Creatinine 0.76 - 1.27 mg/dL 1.27 1.26(H) 1.04  BUN/Creat Ratio 10 - 24 24 - 25(H)  Sodium 134 - 144 mmol/L 142 137 139  Potassium 3.5 - 5.2 mmol/L 5.5(H) 5.2(H) 6.3(HH)  Chloride 96 - 106 mmol/L 105 104 103  CO2 20 - 29 mmol/L _0 Calcium 8.6 - 10.2 mg/dL 10.0 9.4 10.1     Hepatic Function Latest Ref Rng & Units 02/08/2021 06/29/2020 11/07/2019  Total Protein - CANCELED 7.4 7.3  Albumin 3.5 - 5.2 g/dL - 4.3 4.4  AST 0 - 37 U/L - 17 19  ALT 0 - 53 U/L - 14 17  Alk Phosphatase 39 - 117 U/L - 67 71  Total Bilirubin 0.2 - 1.2 mg/dL - 0.4 0.4  Bilirubin, Direct 0.0 - 0.3 mg/dL - 0.1 0.1  CBC Latest Ref Rng & Units 07/25/2020 08/02/2019 11/10/2018  WBC 3.4 - 10.8 x10E3/uL 7.9 9.2 8.2  Hemoglobin 13.0 - 17.7 g/dL 14.3 13.4 13.0  Hematocrit 37.5 - 51.0 % 42.3 40.7 39.7  Platelets 150 - 450 x10E3/uL 195 188 182   Lab Results  Component Value Date   MCV 90 07/25/2020   MCV 92.7 08/02/2019   MCV 94.3 11/10/2018   Lab Results  Component Value Date   TSH 0.174 (L) 09/13/2016   No results found for: HGBA1C   BNP No results found for: BNP  ProBNP    Component Value Date/Time   PROBNP 557 (H) 04/02/2017 1204     Lipid Panel  No results found for: CHOL, TRIG, HDL, CHOLHDL, VLDL, LDLCALC, LDLDIRECT, LABVLDL   RADIOLOGY: CUP PACEART REMOTE DEVICE CHECK  Result Date: 05/09/2021 Scheduled remote reviewed. Normal device function.  Next remote 91 days. LR     Additional studies/ records that were reviewed today include:   ECHO 1/12021  MPRESSIONS   1. Left ventricular ejection fraction, by visual estimation, is 50 to  55%. The left ventricle has normal function. There is no left ventricular  hypertrophy.   2. Elevated left atrial pressure.   3. Left ventricular diastolic parameters are consistent with Grade II  diastolic dysfunction (pseudonormalization).   4. The left ventricle  has no regional wall motion abnormalities.   5. Global right ventricle has normal systolic function.The right  ventricular size is mildly enlarged.   6. Left atrial size was normal.   7. Right atrial size was normal.   8. Mild mitral annular calcification.   9. The mitral valve is normal in structure. Mild mitral valve  regurgitation. No evidence of mitral stenosis.  10. The tricuspid valve is normal in structure.  11. The aortic valve is tricuspid. Aortic valve regurgitation is trivial.  Mild aortic valve sclerosis without stenosis.  12. The pulmonic valve was normal in structure. Pulmonic valve  regurgitation is trivial.  13. Moderately elevated pulmonary artery systolic pressure.  14. A pacer wire is visualized.  15. The inferior vena cava is normal in size with greater than 50%  respiratory variability, suggesting right atrial pressure of 3 mmHg.  16. Normal LV systolic function; grade 2 diastolic dysfunction; trace AI;  mild MR; mild RVE; moderate pulmonary hypertension.   In comparison to the previous echocardiogram(s): 09/17/17 EF 55-60%. PA  pressure 45mHg.     ECHO: 04/08/2021 IMPRESSIONS   1. Left ventricular ejection fraction, by estimation, is 60 to 65%. Left  ventricular ejection fraction by 3D volume is 61 %. The left ventricle has  normal function. The left ventricle has no regional wall motion  abnormalities. There is mild left  ventricular hypertrophy. Left ventricular diastolic parameters are  consistent with Grade II diastolic dysfunction (pseudonormalization).   2. Right ventricular systolic function is normal. The right ventricular  size is normal. There is moderately elevated pulmonary artery systolic  pressure. The estimated right ventricular systolic pressure is 502.4mmHg.   3. Left atrial size was moderately dilated.   4. Right atrial size was moderately dilated.   5. The mitral valve is abnormal. Mild to moderate mitral valve  regurgitation. Moderate  mitral annular calcification.   6. The tricuspid valve is abnormal. Tricuspid valve regurgitation is  moderate.   7. The aortic valve is tricuspid. Aortic valve regurgitation is trivial.  Aortic valve sclerosis is present, with no evidence of aortic valve  stenosis. Aortic regurgitation PHT measures 594  msec.   8. Cannot exclude a small PFO.   Comparison(s): Changes from prior study are noted. 06/07/2019: LVEF 50-55%.   ASSESSMENT:    1. Coronary artery disease involving native coronary artery of native heart without angina pectoris   2. PAF (paroxysmal atrial fibrillation) ([place])   3. Essential hypertension   4. OSA on CPAP   5. Hyperlipidemia with target LDL less than 70   6. CHB (complete heart block) (HCC)   7. Pacemaker   8. ILD (interstitial lung disease) ([place])   9. Hypothyroidism, unspecified type     PLAN:  1. CAD: He is status post PCI with DES stenting to his left circumflex and OM1 vessel on August 12, 2019.  He continues to be asymptomatic without recurrent anginal symptomatology.  He is on diltiazem 120 mg, nebivolol 5 mg daily, aspirin in addition to telmisartan.  He continues to be without recurrent anginal symptomatology on aspirin, nebivolol 5 mg daily, pravastatin and telmisartan.  2.  Essential hypertension: Blood pressure today is controlled on telmisartan 80 mg, diltiazem 120 mg, and nebivolol 5 mg daily.  3.  Dyspnea: At cardiac catheterization in 2019 he had normal left and right filling pressures with mild pulmonary hypertension.  On echo Doppler study in January 2021 he had  grade 2 diastolic dysfunction, trace AI, mild MR, mild RVE and moderate pulmonary hypertension with estimated RV systolic pressure of 97.6 mm.  He has interstitial lung disease followed by [name].  I reviewed his most recent echo Doppler study from April 08, 2021 which shows normal systolic function with EF at 60 to 65% and grade 2 diastolic dysfunction.  Estimated pulmonary systolic  pressure is now further increased at 54.8 mmHg.  There is evidence for moderate biatrial enlargement, mild to moderate mitral regurgitation with mitral annular calcification and aortic valve sclerosis.  Per request of [name], the patient will be scheduled for a right heart catheterization after the New Year's and tentatively this was set up today to be done on May 29, 2021.  The patient is aware of the risk benefits of the procedure.  4.  Obstructive sleep apnea: He continues to use CPAP therapy with compliance.  Recent download showed AHI of 1.3.  No significant mask leak.  5.  Pacemaker: ECG shows AV paced rhythm.  Remote history of complete heart block.  He underwent pacemaker change out as well as new atrial lead placement by [name].  When most recently seen by [name] on April 12, 2021 he was in atrial flutter and he was successfully atrially paced back to sinus rhythm.  ECG today demonstrates AV paced rhythm.  6.  Hyperlipidemia: He continues to be on pravastatin 20 mg daily.  Target LDL less than 70.    7.  BPH:  followed by [facility] urology  8.  Type 2 diabetes mellitus: Stable on glimepiride 2 mg and metformin 500 mg twice a day  9.  Hypothyroidism: On Armour Thyroid 90 mg daily, followed by Dr. Doy Hutching.     Patient will be scheduled for outpatient right heart catheterization in May 29, 2021.  Medication Adjustments/Labs and Tests Ordered: Current medicines are reviewed at length with the patient today.  Concerns regarding medicines are outlined above.  Medication changes, Labs and Tests ordered today are listed in the Patient Instructions below. Patient Instructions  Medication Instructions:  Your physician recommends that you continue on your current medications as directed. Please refer to the Current Medication list given to you  today.   *If you need a refill on your cardiac medications before your next appointment, please call your pharmacy*   Lab  Work: Your physician recommends that you return for lab work in: 1 week prior to heart cath procedure  If you have labs (blood work) drawn today and your tests are completely normal, you will receive your results only by: [place] (if you have MyChart) OR A paper copy in the mail If you have any lab test that is abnormal or we need to change your treatment, we will call you to review the results.   Testing/Procedures: See below   Follow-Up: At [facility], you and your health needs are our priority.  As part of our continuing mission to provide you with exceptional heart care, we have created designated Provider Care Teams.  These Care Teams include your primary Cardiologist (physician) and Advanced Practice Providers (APPs -  Physician Assistants and Nurse Practitioners) who all work together to provide you with the care you need, when you need it.  We recommend signing up for the patient portal called "MyChart".  Sign up information is provided on this After Visit Summary.  MyChart is used to connect with patients for Virtual Visits (Telemedicine).  Patients are able to view lab/test results, encounter notes, upcoming appointments, etc.  Non-urgent messages can be sent to your provider as well.   To learn more about what you can do with MyChart, go to [email or web address].    Your next appointment:   3 month(s)  The format for your next appointment:   In Person  Provider:   [name], MD :1}    Other Instructions  [facility] [place] [address] Dept: [phone] Loc: [place]  05/09/2021  You are scheduled for a Cardiac Catheterization on Wednesday, January 4 with Dr. [name].  1. Please arrive at the [facility] (Main Entrance A) at [facility]: [address] [place], [place] [address] at 5:30 AM (This time is two hours  before your procedure to ensure your preparation). Free valet parking service is available.   Special note: Every effort is made to have your procedure done on time. Please understand that emergencies sometimes delay scheduled procedures.  2. Diet: Do not eat solid foods after midnight.  The patient may have clear liquids until 5am upon the day of the procedure.  3. Labs: You will need to have blood drawn 1 week prior to procedure    4. Medication instructions in preparation for your procedure:  Stop taking Xarelto (Rivaroxaban) on Monday, January 2.   Do not take Diabetes Med Glucophage (Metformin) on the day of the procedure and HOLD 48 HOURS AFTER THE PROCEDURE.  On the morning of your procedure, take your Aspirin and any morning medicines NOT listed above.  You may use sips of water.  5. Plan for one night stay--bring personal belongings. 6. Bring a current list of your medications and current insurance cards. 7. You MUST have a responsible person to drive you home. 8. Someone MUST be with you the first 24 hours after you arrive home or your discharge will be delayed. 9. Please wear clothes that are easy to get on and off and wear slip-on shoes.  Thank you for allowing [place] to care for you!   -- [facility]     Signed, [name], MD  05/16/2021 10:12  [facility] [address], [address], [place], [place]  [address] Phone: [phone]

## 2021-05-09 NOTE — Telephone Encounter (Signed)
Thanks a Doctor, general practice. Closing note

## 2021-05-09 NOTE — Telephone Encounter (Signed)
MR I just saw Mr. Vitali in office today; per your request will tentatively set up for R heart cath on May 29, 2021. Will hold xarelto for the procedure. TK

## 2021-05-16 ENCOUNTER — Encounter: Payer: Self-pay | Admitting: Cardiovascular Disease

## 2021-05-21 NOTE — Progress Notes (Signed)
Remote pacemaker transmission.

## 2021-05-24 LAB — CBC
Hematocrit: 37.5 % (ref 37.5–51.0)
Hemoglobin: 12.4 g/dL — ABNORMAL LOW (ref 13.0–17.7)
MCH: 30.3 pg (ref 26.6–33.0)
MCHC: 33.1 g/dL (ref 31.5–35.7)
MCV: 92 fL (ref 79–97)
Platelets: 185 10*3/uL (ref 150–450)
RBC: 4.09 x10E6/uL — ABNORMAL LOW (ref 4.14–5.80)
RDW: 12.7 % (ref 11.6–15.4)
WBC: 7.4 10*3/uL (ref 3.4–10.8)

## 2021-05-24 LAB — BASIC METABOLIC PANEL
BUN/Creatinine Ratio: 22 (ref 10–24)
BUN: 25 mg/dL (ref 8–27)
CO2: 26 mmol/L (ref 20–29)
Calcium: 9.6 mg/dL (ref 8.6–10.2)
Chloride: 105 mmol/L (ref 96–106)
Creatinine, Ser: 1.15 mg/dL (ref 0.76–1.27)
Glucose: 155 mg/dL — ABNORMAL HIGH (ref 70–99)
Potassium: 4.9 mmol/L (ref 3.5–5.2)
Sodium: 141 mmol/L (ref 134–144)
eGFR: 64 mL/min/{1.73_m2} (ref >59)

## 2021-05-28 ENCOUNTER — Telehealth: Payer: Self-pay | Admitting: *Deleted

## 2021-05-28 NOTE — Telephone Encounter (Addendum)
Cardiac catheterization scheduled at [facility] for: Wednesday May 29, 2021 8:30 AM [facility] Main Entrance A [facility]) at: 6:30 AM   Diet-no solid food after midnight prior to cath, clear liquids until 5 AM day of procedure.  Medication instructions for procedure: -Hold:  Xarelto-none 05/27/21 until post procedure  Metformin/Glimepiride-AM of procedure -Except hold medications usual morning medications can be taken pre-cath with sips of water.    Confirmed patient has responsible adult to drive home post procedure and be with patient first 24 hours after arriving home.  [facility] does allow one visitor to accompany you and wait in the hospital waiting room while you are there for your procedure. You and your visitor will be asked to wear a mask once you enter the hospital.   Patient reports does not currently have any new symptoms concerning for COVID-19 and no household members with COVID-19 like illness. *   Reviewed procedure/mask/visitor instructions with patient's wife [name]).  *Patient's wife reports starting about 4 days ago, she developed symptoms of head congestion/cold, did COVID test at home that was negative, contacted her doctor, was prescribed antibiotic, and symptoms are improving. Patient's wife reports patient has had a cough -she wasn't sure if this was new cough.             I spoke with patient and he reports cough is not new and he feels related to his lung disease.            Patient reports he has not had cough this morning is afebrile, denies any new symptoms concerning for COVID-19.              Pt reports he has COVID test at home he will do this morning and I will call him back to get results.

## 2021-05-28 NOTE — Telephone Encounter (Addendum)
See previous note about cough. I called patient to get results of COVID test. Patient reports results of COVID test done at home this morning is negative.

## 2021-05-29 ENCOUNTER — Encounter (HOSPITAL_COMMUNITY): Payer: Self-pay | Admitting: Cardiovascular Disease

## 2021-05-29 ENCOUNTER — Other Ambulatory Visit: Payer: Self-pay

## 2021-05-29 ENCOUNTER — Ambulatory Visit (HOSPITAL_COMMUNITY)
Admission: RE | Admit: 2021-05-29 | Discharge: 2021-05-29 | Disposition: A | Discharge status: Home / Self Care | Payer: Medicare Other | Attending: Cardiovascular Disease | Admitting: Cardiovascular Disease

## 2021-05-29 ENCOUNTER — Encounter (HOSPITAL_COMMUNITY)
Admission: RE | Disposition: A | Discharge status: Home / Self Care | Payer: Self-pay | Source: Home / Self Care | Attending: Cardiovascular Disease

## 2021-05-29 ENCOUNTER — Telehealth: Payer: Self-pay | Admitting: Internal Medicine

## 2021-05-29 DIAGNOSIS — I110 Hypertensive heart disease with heart failure: Secondary | ICD-10-CM | POA: Diagnosis not present

## 2021-05-29 DIAGNOSIS — Z955 Presence of coronary angioplasty implant and graft: Secondary | ICD-10-CM | POA: Diagnosis not present

## 2021-05-29 DIAGNOSIS — I2510 Atherosclerotic heart disease of native coronary artery without angina pectoris: Secondary | ICD-10-CM | POA: Diagnosis not present

## 2021-05-29 DIAGNOSIS — I083 Combined rheumatic disorders of mitral, aortic and tricuspid valves: Secondary | ICD-10-CM | POA: Insufficient documentation

## 2021-05-29 DIAGNOSIS — N400 Enlarged prostate without lower urinary tract symptoms: Secondary | ICD-10-CM | POA: Diagnosis not present

## 2021-05-29 DIAGNOSIS — G4733 Obstructive sleep apnea (adult) (pediatric): Secondary | ICD-10-CM | POA: Insufficient documentation

## 2021-05-29 DIAGNOSIS — I272 Pulmonary hypertension, unspecified: Secondary | ICD-10-CM

## 2021-05-29 DIAGNOSIS — J849 Interstitial pulmonary disease, unspecified: Secondary | ICD-10-CM | POA: Insufficient documentation

## 2021-05-29 DIAGNOSIS — M069 Rheumatoid arthritis, unspecified: Secondary | ICD-10-CM | POA: Diagnosis not present

## 2021-05-29 DIAGNOSIS — I480 Paroxysmal atrial fibrillation: Secondary | ICD-10-CM | POA: Insufficient documentation

## 2021-05-29 DIAGNOSIS — I442 Atrioventricular block, complete: Secondary | ICD-10-CM | POA: Insufficient documentation

## 2021-05-29 DIAGNOSIS — Z79890 Hormone replacement therapy (postmenopausal): Secondary | ICD-10-CM | POA: Insufficient documentation

## 2021-05-29 DIAGNOSIS — Z7984 Long term (current) use of oral hypoglycemic drugs: Secondary | ICD-10-CM | POA: Diagnosis not present

## 2021-05-29 DIAGNOSIS — E785 Hyperlipidemia, unspecified: Secondary | ICD-10-CM | POA: Diagnosis not present

## 2021-05-29 DIAGNOSIS — Z79899 Other long term (current) drug therapy: Secondary | ICD-10-CM | POA: Diagnosis not present

## 2021-05-29 DIAGNOSIS — E039 Hypothyroidism, unspecified: Secondary | ICD-10-CM | POA: Insufficient documentation

## 2021-05-29 DIAGNOSIS — E119 Type 2 diabetes mellitus without complications: Secondary | ICD-10-CM | POA: Diagnosis not present

## 2021-05-29 DIAGNOSIS — Z950 Presence of cardiac pacemaker: Secondary | ICD-10-CM | POA: Diagnosis not present

## 2021-05-29 DIAGNOSIS — I2729 Other secondary pulmonary hypertension: Secondary | ICD-10-CM | POA: Diagnosis present

## 2021-05-29 DIAGNOSIS — I509 Heart failure, unspecified: Secondary | ICD-10-CM | POA: Diagnosis not present

## 2021-05-29 HISTORY — PX: RIGHT HEART CATH: CATH118263

## 2021-05-29 LAB — POCT I-STAT EG7
Acid-base deficit: 1 mmol/L (ref 0.0–2.0)
Acid-base deficit: 1 mmol/L (ref 0.0–2.0)
Bicarbonate: 24.7 mmol/L (ref 20.0–28.0)
Bicarbonate: 25 mmol/L (ref 20.0–28.0)
Calcium, Ion: 1.21 mmol/L (ref 1.15–1.40)
Calcium, Ion: 1.28 mmol/L (ref 1.15–1.40)
HCT: 32 % — ABNORMAL LOW (ref 39.0–52.0)
HCT: 33 % — ABNORMAL LOW (ref 39.0–52.0)
Hemoglobin: 10.9 g/dL — ABNORMAL LOW (ref 13.0–17.0)
Hemoglobin: 11.2 g/dL — ABNORMAL LOW (ref 13.0–17.0)
O2 Saturation: 63 %
O2 Saturation: 66 %
Potassium: 3.6 mmol/L (ref 3.5–5.1)
Potassium: 3.8 mmol/L (ref 3.5–5.1)
Sodium: 141 mmol/L (ref 135–145)
Sodium: 142 mmol/L (ref 135–145)
TCO2: 26 mmol/L (ref 22–32)
TCO2: 26 mmol/L (ref 22–32)
pCO2, Ven: 47.2 mmHg (ref 44.0–60.0)
pCO2, Ven: 47.3 mmHg (ref 44.0–60.0)
pH, Ven: 7.327 (ref 7.250–7.430)
pH, Ven: 7.331 (ref 7.250–7.430)
pO2, Ven: 35 mmHg (ref 32.0–45.0)
pO2, Ven: 37 mmHg (ref 32.0–45.0)

## 2021-05-29 LAB — GLUCOSE, CAPILLARY
Glucose-Capillary: 108 mg/dL — ABNORMAL HIGH (ref 70–99)
Glucose-Capillary: 103 mg/dL — ABNORMAL HIGH (ref 70–99)

## 2021-05-29 SURGERY — RIGHT HEART CATH

## 2021-05-29 MED ORDER — FENTANYL CITRATE (PF) 100 MCG/2ML IJ SOLN
INTRAMUSCULAR | Status: DC | PRN
  Administered 2021-05-29: 25 ug via INTRAVENOUS

## 2021-05-29 MED ORDER — SODIUM CHLORIDE 0.9 % IV SOLN: INTRAVENOUS | Status: DC

## 2021-05-29 MED ORDER — ACETAMINOPHEN 325 MG PO TABS: 650.0000 mg | ORAL_TABLET | ORAL | Status: DC | PRN

## 2021-05-29 MED ORDER — SODIUM CHLORIDE 0.9 % IV SOLN: 250.0000 mL | INTRAVENOUS | Status: DC | PRN

## 2021-05-29 MED ORDER — SODIUM CHLORIDE 0.9% FLUSH: 3.0000 mL | INTRAVENOUS | Status: DC | PRN

## 2021-05-29 MED ORDER — SODIUM CHLORIDE 0.9% FLUSH: 3.0000 mL | Freq: Two times a day (BID) | INTRAVENOUS | Status: DC

## 2021-05-29 MED ORDER — HEPARIN (PORCINE) IN NACL 1000-0.9 UT/500ML-% IV SOLN
INTRAVENOUS | Status: DC | PRN
  Administered 2021-05-29: 500 mL

## 2021-05-29 MED ORDER — LABETALOL HCL 5 MG/ML IV SOLN: 10.0000 mg | INTRAVENOUS | Status: DC | PRN

## 2021-05-29 MED ORDER — FENTANYL CITRATE (PF) 100 MCG/2ML IJ SOLN
INTRAMUSCULAR | Status: AC
  Filled 2021-05-29: qty 2

## 2021-05-29 MED ORDER — HYDRALAZINE HCL 20 MG/ML IJ SOLN: 10.0000 mg | INTRAMUSCULAR | Status: DC | PRN

## 2021-05-29 MED ORDER — ONDANSETRON HCL 4 MG/2ML IJ SOLN: 4.0000 mg | Freq: Four times a day (QID) | INTRAMUSCULAR | Status: DC | PRN

## 2021-05-29 MED ORDER — LIDOCAINE HCL (PF) 1 % IJ SOLN
INTRAMUSCULAR | Status: AC
  Filled 2021-05-29: qty 30

## 2021-05-29 MED ORDER — LIDOCAINE HCL (PF) 1 % IJ SOLN
INTRAMUSCULAR | Status: DC | PRN
  Administered 2021-05-29: 2 mL

## 2021-05-29 MED ORDER — MIDAZOLAM HCL 2 MG/2ML IJ SOLN
INTRAMUSCULAR | Status: AC
  Filled 2021-05-29: qty 2

## 2021-05-29 MED ORDER — MIDAZOLAM HCL 2 MG/2ML IJ SOLN
INTRAMUSCULAR | Status: DC | PRN
  Administered 2021-05-29: 1 mg via INTRAVENOUS

## 2021-05-29 MED ORDER — ASPIRIN 81 MG PO CHEW
81.0000 mg | CHEWABLE_TABLET | ORAL | Status: AC
  Administered 2021-05-29: 81 mg via ORAL
  Filled 2021-05-29: qty 1

## 2021-05-29 SURGICAL SUPPLY — 5 items
CATH BALLN WEDGE 5F 110CM (CATHETERS) ×1 IMPLANT
PACK CARDIAC CATHETERIZATION (CUSTOM PROCEDURE TRAY) ×1 IMPLANT
PROTECTION STATION PRESSURIZED (MISCELLANEOUS) ×2
SHEATH GLIDE SLENDER 4/5FR (SHEATH) ×1 IMPLANT
STATION PROTECTION PRESSURIZED (MISCELLANEOUS) IMPLANT

## 2021-05-29 NOTE — Telephone Encounter (Signed)
Cath 1/4

## 2021-05-29 NOTE — Discharge Instructions (Signed)
Brachial Site Care:  1.) Leave dressing on for 24 hours (remove at 9 am on 1/5) 2.) You may shower after removing dressing. 3.) No soaking in baths, hot tubs, pools, etc for one week. 4.) No lifting anything heavier than 10 pounds for one week with right arm.

## 2021-05-29 NOTE — Interval H&P Note (Signed)
History and Physical Interval Note:  05/29/2021 8:21 AM  Cameron [name]  has presented today for surgery, with the diagnosis of pulmonary HTN.  The various methods of treatment have been discussed with the patient and family. After consideration of risks, benefits and other options for treatment, the patient has consented to  Procedure(s): RIGHT HEART CATH (N/A) as a surgical intervention.  The patient's history has been reviewed, patient examined, no change in status, stable for surgery.  I have reviewed the patient's chart and labs.  Questions were answered to the patient's satisfaction.     [name]

## 2021-05-30 ENCOUNTER — Telehealth: Payer: Self-pay

## 2021-05-30 NOTE — Telephone Encounter (Signed)
Patient's spouse called regarding Vtama and Wynzora samples. He has had improvement but not clear yet. She wanted to know could they have more samples? Wanted to discuss with you and make sure you were okay with this before doing so.

## 2021-05-30 NOTE — Telephone Encounter (Signed)
Called and spoke with [name]. She was calling to setup an appt for the patient since he completed his right heart cath yesterday. I was able to get him scheduled for 06/13/21 at 3pm (30 min slot) with MR. She verbalized understanding.   Nothing further needed at time of call.

## 2021-05-31 ENCOUNTER — Other Ambulatory Visit (HOSPITAL_COMMUNITY): Payer: Self-pay | Admitting: *Deleted

## 2021-05-31 MED ORDER — RIVAROXABAN 20 MG PO TABS: 20.0000 mg | ORAL_TABLET | Freq: Every day | ORAL | 2 refills | Status: DC

## 2021-06-03 ENCOUNTER — Other Ambulatory Visit (HOSPITAL_COMMUNITY): Payer: Self-pay | Admitting: *Deleted

## 2021-06-03 ENCOUNTER — Other Ambulatory Visit: Payer: Self-pay

## 2021-06-03 MED ORDER — RIVAROXABAN 20 MG PO TABS: 20.0000 mg | ORAL_TABLET | Freq: Every day | ORAL | 2 refills | Status: DC

## 2021-06-03 MED ORDER — KETOCONAZOLE 2 % EX CREA: 1.0000 "application " | TOPICAL_CREAM | Freq: Every day | CUTANEOUS | 1 refills | Status: DC | PRN

## 2021-06-03 MED ORDER — NITROGLYCERIN 0.4 MG SL SUBL: 0.4000 mg | SUBLINGUAL_TABLET | SUBLINGUAL | 2 refills | Status: DC | PRN

## 2021-06-05 NOTE — Telephone Encounter (Signed)
Left voicemail to return my call. aw

## 2021-06-05 NOTE — Telephone Encounter (Signed)
Yes, please give some samples if we have any. Please also submit for Vtama. If Vtama not approved, will submit for Enstilar. Thank you!

## 2021-06-06 ENCOUNTER — Other Ambulatory Visit: Payer: Self-pay

## 2021-06-06 MED ORDER — VTAMA 1 % EX CREA
1.0000 | TOPICAL_CREAM | Freq: Every day | CUTANEOUS | 0 refills | Status: DC
Start: 2021-06-06 — End: 2022-04-01

## 2021-06-06 NOTE — Telephone Encounter (Signed)
Patient advised and will come today to pick up samples. aw

## 2021-06-06 NOTE — Progress Notes (Signed)
Vtama prescription to [facility] to see what coverage will be for patient. aw

## 2021-06-10 ENCOUNTER — Telehealth: Payer: Self-pay | Admitting: Internal Medicine

## 2021-06-10 ENCOUNTER — Other Ambulatory Visit: Payer: Self-pay | Admitting: Internal Medicine

## 2021-06-10 DIAGNOSIS — M359 Systemic involvement of connective tissue, unspecified: Secondary | ICD-10-CM

## 2021-06-10 DIAGNOSIS — J8489 Other specified interstitial pulmonary diseases: Secondary | ICD-10-CM

## 2021-06-10 MED ORDER — PIRFENIDONE 267 MG PO TABS: ORAL_TABLET | ORAL | 11 refills | Status: DC

## 2021-06-10 NOTE — Telephone Encounter (Signed)
Called and spoke with patient. He stated that he is completely out of his Esbriet. He had requested a refill about a week ago and [facility] advised him that they had sent a request to our office. They did not send a request until this morning after he called them back.   I advised him that I would send in the refills for him and place note for them to fill it ASAP since he was out. He verbalized understanding.   Nothing further needed at time of call.

## 2021-06-11 ENCOUNTER — Telehealth: Payer: Self-pay | Admitting: Internal Medicine

## 2021-06-11 NOTE — Telephone Encounter (Signed)
Spoke with patient's wife - advised that rx for Esbriet was sent to [facility] yesterday, 06/10/21. She states it usually takes 2-3 business days to get to his home.  Reiterated that if patient is off of therapy > 2 weeks then he must re-titrate. Patient has been off of therapy for 2 days only per wife. She verbalized understanding.  Knox Saliva, PharmD, MPH, BCPS Clinical Pharmacist (Rheumatology and Pulmonology)

## 2021-06-11 NOTE — Telephone Encounter (Signed)
Devki   [name] tells me on video when I was doing video with wife that [facility] dropped ball on esbriet. He thinks it is squared awaw now. Told him if off drug >- 2 weeks then restart fresh titration. Please followup  Thanks  MR

## 2021-06-13 ENCOUNTER — Ambulatory Visit (INDEPENDENT_AMBULATORY_CARE_PROVIDER_SITE_OTHER): Payer: Medicare Other | Admitting: Internal Medicine

## 2021-06-13 ENCOUNTER — Encounter: Payer: Self-pay | Admitting: Internal Medicine

## 2021-06-13 ENCOUNTER — Encounter: Payer: Self-pay | Admitting: Dermatology

## 2021-06-13 ENCOUNTER — Other Ambulatory Visit: Payer: Self-pay

## 2021-06-13 ENCOUNTER — Ambulatory Visit: Payer: Medicare Other | Admitting: Dermatology

## 2021-06-13 ENCOUNTER — Ambulatory Visit (INDEPENDENT_AMBULATORY_CARE_PROVIDER_SITE_OTHER): Payer: Medicare Other | Admitting: Dermatology

## 2021-06-13 VITALS — BP 118/64 | HR 71 | Temp 97.4°F | Ht 67.0 in | Wt 189.2 lb

## 2021-06-13 DIAGNOSIS — R0609 Other forms of dyspnea: Secondary | ICD-10-CM | POA: Diagnosis not present

## 2021-06-13 DIAGNOSIS — L409 Psoriasis, unspecified: Secondary | ICD-10-CM | POA: Diagnosis not present

## 2021-06-13 DIAGNOSIS — Z77098 Contact with and (suspected) exposure to other hazardous, chiefly nonmedicinal, chemicals: Secondary | ICD-10-CM | POA: Diagnosis not present

## 2021-06-13 DIAGNOSIS — Z8739 Personal history of other diseases of the musculoskeletal system and connective tissue: Secondary | ICD-10-CM

## 2021-06-13 DIAGNOSIS — J8489 Other specified interstitial pulmonary diseases: Secondary | ICD-10-CM

## 2021-06-13 DIAGNOSIS — I5189 Other ill-defined heart diseases: Secondary | ICD-10-CM

## 2021-06-13 DIAGNOSIS — R21 Rash and other nonspecific skin eruption: Secondary | ICD-10-CM | POA: Diagnosis not present

## 2021-06-13 DIAGNOSIS — M359 Systemic involvement of connective tissue, unspecified: Secondary | ICD-10-CM | POA: Diagnosis not present

## 2021-06-13 DIAGNOSIS — I2729 Other secondary pulmonary hypertension: Secondary | ICD-10-CM

## 2021-06-13 LAB — BASIC METABOLIC PANEL
BUN: 26 mg/dL — ABNORMAL HIGH (ref 6–23)
CO2: 30 mEq/L (ref 19–32)
Calcium: 9.8 mg/dL (ref 8.4–10.5)
Chloride: 106 mEq/L (ref 96–112)
Creatinine, Ser: 1.61 mg/dL — ABNORMAL HIGH (ref 0.40–1.50)
GFR: 39.94 mL/min — ABNORMAL LOW (ref >60.00)
Glucose, Bld: 151 mg/dL — ABNORMAL HIGH (ref 70–99)
Potassium: 4.3 mEq/L (ref 3.5–5.1)
Sodium: 143 mEq/L (ref 135–145)

## 2021-06-13 LAB — HEPATIC FUNCTION PANEL
ALT: 13 U/L (ref 0–53)
AST: 15 U/L (ref 0–37)
Albumin: 4.1 g/dL (ref 3.5–5.2)
Alkaline Phosphatase: 76 U/L (ref 39–117)
Bilirubin, Direct: 0.1 mg/dL (ref 0.0–0.3)
Total Bilirubin: 0.5 mg/dL (ref 0.2–1.2)
Total Protein: 7 g/dL (ref 6.0–8.3)

## 2021-06-13 MED ORDER — FUROSEMIDE 20 MG PO TABS: 20.0000 mg | ORAL_TABLET | Freq: Every day | ORAL | 3 refills | Status: DC

## 2021-06-13 MED ORDER — TERBINAFINE HCL 1 % EX CREA: TOPICAL_CREAM | CUTANEOUS | 2 refills | Status: DC

## 2021-06-13 MED ORDER — POTASSIUM CHLORIDE CRYS ER 10 MEQ PO TBCR: 10.0000 meq | EXTENDED_RELEASE_TABLET | Freq: Every day | ORAL | 3 refills | Status: DC

## 2021-06-13 MED ORDER — CLOBETASOL PROPIONATE 0.05 % EX FOAM: CUTANEOUS | 2 refills | Status: DC

## 2021-06-13 NOTE — Patient Instructions (Addendum)
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 Hx of Agent Orange Exposure   - Pulmonary Fibrosis based on PFT and walk test is every so slowly and steadiy progressive  sinc 2019 and 2020 but stable since  visit May 2021 and starting esbriet in June 2021 through Nov 2022  Plan - check LFT 06/13/2021  - -Continue Esbriet per protocol [eat with food, space adequately between meals and apply sunscreen at all times] -Continue daily exercise as before - do sprimetyr and dlco in 4-6 months - change HRCT in march 20232 to 4-6 months   Shortness of breath Grade 2 diastolic dysfunction Pulmonary venous hypertension  -  you have increased pulmonary pressures because of stifff heart muscle and valve leak  Plan - check bmet 06/13/2021 - no role for tyvaso as yet  - start lasix 40m per day with KCL 179m per day - please follow with Dr KeClaiborne Billings Follow-up --15 min video vwith with APP in 4 weeks to see response to lasix  - 30 min face to face with Dr RaChase Callern 4-6 months but after PFT and CT  - ild symptm question and simple walk test at followup

## 2021-06-13 NOTE — Progress Notes (Signed)
PCP [name], MD  HPI   IOV 07/10/2017  Chief Complaint  Patient presents with   Advice Only    Referred by [facility] due to SOB.  PFT done 05/28/17.  Pt has been having issues with SOB x4 months especially with exertion and has some mild chest tightness. Denies any cough.    81 year old male referred by [name] for evaluation of shortness of breath after cardiac etiologies ruled out.  He tells me that he is a remote smoker.  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  He has a long-standing history of rheumatoid arthritis followed by Dr. Jefm Bryant in [place].  He is to be on methotrexate for many years and stopped taking it because of cardiac dysfunction [he personally is convinced that methotrexate because this].  He was then on leflunomide as of 2017 but is currently not on it.  His last rheumatoid factor and CCP antibodies were negative on my personal chart review of the outside records in 2016.    Now for the last 3 or 6 months he is got insidious onset of shortness of breath that is slowly progressive.  His dyspnea on exertion relieved by rest.  Class II-3 activities.  There I  s no associated cough or orthopnea proximal nocturnal dyspnea.  He did have some edema but this got cleared up but the dyspnea is continuing to get worse.  In the last few months he has had a cardiac echo that showed pulmonary hypertension.  Did have cardiac stress test that is normal.  Had pulmonary function test that showed isolated reduction in diffusion capacity and therefore he has been referred here.  Walking desaturation test on 07/10/2017 185 feet x 3 laps on ROOM AIR:  did NOT desaturate. Rest pulse ox was 100%, final pulse ox was 98%. HR response 60/min at rest to 121/min at peak exertion. Patient Cameron [name]  Did not Desaturate < 88% . Cameron [name] did not  Desaturated </=  3% points. Gerda Diss Hendry yes did get tachyardic   OV 08/04/2017  Chief Complaint  Patient presents with   Follow-up    ILD    Follow-up interstitial lung disease workup  After the last visit no interim problems.  He has some chronic pedal edema that he will talk to about with his primary care physician.  He did see Dr. Jefm Bryant rheumatologist in July 15, 2017.  I reviewed his notes.  It is not specifically indicate patient has nonspecific seronegative arthritis with a differential diagnosis of seronegative rheumatoid arthritis versus psoriatic.  Patient still seems to think the root of all his problems is the methotrexate he took for over 10 years.  At this point in time there is no decompensation.  As part of the ILD workup his pulmonary function test shows mild reduction in diffusion capacity.  Correlating with this is evidence of pulmonary hypertension on the echo and high-resolution CT chest enlarged pulmonary arteries.  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  His autoimmune panel is negative.  And so the vasculitis panel and hypersensitivity pneumonitis panel.    IMPRESSION: 1. Mild subpleural reticular densities in the posterolateral aspects of both lower lobes are suspicious for mild fibrotic interstitial lung disease such as nonspecific interstitial  pneumonitis. Early/mild usual interstitial pneumonitis is not excluded. 2. Aortic atherosclerosis (ICD10-170.0). Coronary artery calcification. 3. Enlarged pulmonary arteries, indicative of pulmonary arterial Hypertension.- > in echo  Nov 2018: Pulmonary arteries: Systolic pressure was moderately increased.   PA peak pressure: 55 mm Hg (S). 4. Left renal stone, partially imaged.     Electronically Signed   By: Lorin Picket M.D.   On: 07/22/2017 15:03  OV 11/03/2017  Chief Complaint  Patient presents with   Follow-up    Pt has SOB with  exertion, some dry cough.     Follow-up suspected interstitial lung disease in the setting of rheumatoid arthritis and long methotrexate intake  He presents with his wife.  At the time of last visit I was not fully convinced that he had interstitial lung disease.  His CT scan indicated presence of pulmonary hypertension and so did his echocardiogram.  Therefore I referred him back to [name] cardiology.  In the spring 2019 he did have a right heart catheterization and left heart catheterization that showed mild pulmonary hypertension but also coronary artery blockage.  He status post 2 stents.  After that his shortness of breath improved but he tells me overall his fatigue level has not improved.  In talking to him I find out that he exercises 5 times a week doing heart track 2 times a week and the other 3 days walking a mile each time and doing weight lifting.  It appears that he takes a 20-minute nap after these exercises and then feels reenergized.  His mother feels that he does not have the effort tolerance as his younger days but he denies having any symptoms of chest pain or shortness of breath or cough when he does these heavy exertion the weight lifting or walking a mile out doing heart track.  In fact in the walking desaturation test today he walked extremely fast and had no problems.  In terms of his possible interstitial lung disease he had pulmonary function test today and felt to show some improvement and on exam he does not have any crackles.  Also his wife tells me that for the last 2 months he is using CPAP for sleep apnea and this is also helping him.  Right Heart Pressures RHC Procedural Findings: Hemodynamics (mmHg) RA mean 2 RV 42/6 PA 42/8, mean 21 PCWP mean 7 LV 134/8 AO 147/52       OV 06/01/2019  Subjective:  [name], male , DOB: 06/29/1940 , age 83 y.o. , MRN: [id number] , ADDRESS: [place] [address]   06/01/2019 -   Chief  Complaint  Patient presents with   Follow-up    Pt states he has been doing okay since last visit but states he has been having a little more SOB x4 weeks now. Pt also has occ coughing with yellow phlegm.   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 -returns for follow-up.  I personally have not seen him since the summer 2019.  He says overall he has been stable.  In August 2020 he had a CT scan of the chest that confirmed the presence of interstitial lung disease in the setting of his rheumatoid arthritis.  He was stable.  His pulmonary function test was stable.  He says now in the last 2 months has had a decline in shortness of breath.  There is  also some cough with sputum production but that has resolved.  It is definitely present with exertion but relieved by rest.  His walking desaturation test compared to 18 months ago is roughly the same except that he is very tachycardic.  He does have a pacemaker.  He has an appoint with [name] his electrophysiologist today.  His symptom scores are listed below.  SYMPTOM SCALE - ILD 06/01/2019   O2 use no  Shortness of Breath 0 -> 5 scale with 5 being worst (score 6 If unable to do)  At rest 0  Simple tasks - showers, clothes change, eating, shaving 0  Household (dishes, doing bed, laundry) 0  Shopping 0  Walking level at own pace 2  Walking keeping up with others of same age 18  Walking up Stairs 3  Walking up [place] 3  Total (40 - 48) Dyspnea Score 10  How bad is your cough? 1  How bad is your fatigue 2       ROS - per HPI     OV 08/01/2019 - telephine visit - identified with 2 person identifier. Telephone visit - limits, risks benefits explained  Subjective:  [name], male , DOB: 07-27-1940 , age 16 y.o. , MRN: [id number] , ADDRESS: [place] [address]   08/01/2019 -  Follow-up  interstitial lung disease in the  setting of rheumatoid arthritis and long methotrexate intake   HPI Cameron [name] 81 y.o. - similar dyspnea compared to JAn 2021.  No better nor worse.  After last visit he has seen cardiology x2.  It appears the final conclusion is that diastolic dysfunction might be contributing to his shortness of breath.  But overall not major changes in his cardiac care.  He tells me that overall he is stable.  He had spirometry and DLCO the DLCO itself appears to be reduced compared to the recent 1 but stable compared to older ones.  The FVC suggest decline.  Patient himself feels stable.  Overall some mixed picture.  Last high-resolution CT chest was October 2020.    IMPRESSION: 1. 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 There is no significant air trapping on expiratory phase imaging. Findings are not significantly changed compared to prior examinations and remain in an "indeterminate for UIP" pattern by ATS pulmonary fibrosis criteria, differential considerations including both UIP and NSIP.   2.  Coronary artery disease and aortic atherosclerosis.   3.  Left nephrolithiasis.     Electronically Signed   By: Eddie Candle M.D.   On: 01/12/2019 15:08    OV 10/17/2019  Subjective:  [name], male , DOB: 1940-11-13 , age 89 y.o. , MRN: [id number] , ADDRESS: [place] [address]   10/17/2019 -   Chief Complaint  Patient presents with   Follow-up    pt states sobwhen doing activities.   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 -presents for follow-up after having his spirometry and DLCO and high-resolution CT chest.  Overall he  feels stable compared to the last visit but he says definitely compared to 2 years ago his symptoms are worse.  Compared to 1 year ago his symptoms  are the same.  He is kind of leery of taking new medications.  His high-resolution CT scan of the chest indicates mild progression since 2019 February.  His pulmonary function tests also indicate progression compared to 2 years ago but fluctuant in more recent times.  He is reporting agent orange exposure and is wondering if his ILD could be related to that.  He reminded me that he is already on the ILD-pro registry study.  His next scheduled visit is in October 2021.   High-resolution CT chest May 2021 Lungs/Pleura: Peripheral and basilar predominant subpleural interlobular and intra lobular septal thickening and ground-glass. Findings persist on prone imaging and appear similar to 01/12/2019 but may be minimally progressive from 07/22/2017. 4 mm peripheral left lower lobe nodule (14/101), stable from 07/22/2017 and considered benign. Lungs are otherwise clear. No pleural fluid. Airway is unremarkable. Mild air trapping.   Upper Abdomen: Visualized portions of the liver and adrenal glands are unremarkable. Stones are seen in the kidneys bilaterally. Spleen and visualized portions of the pancreas, stomach and bowel are grossly unremarkable. Cholecystectomy. No upper abdominal adenopathy.   Musculoskeletal: Degenerative changes in the spine. No worrisome lytic or sclerotic lesions.   IMPRESSION: 1. Pulmonary parenchymal pattern of fibrosis appears grossly stable from 01/12/2019 but may be minimally progressive from 07/22/2017. 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 Usual interstitial pneumonitis is certainly not excluded. Findings are indeterminate for UIP per consensus guidelines: Diagnosis of Idiopathic Pulmonary Fibrosis: An Official ATS/ERS/JRS/ALAT Clinical Practice Guideline. [place], Iss 5, [id number], Jan 24 2017. 2. Bilateral renal stones. 3. Aortic atherosclerosis (ICD10-I70.0). Coronary artery calcification. 4. Enlarged pulmonic trunk, indicative of pulmonary arterial hypertension.     Electronically Signed   By: Lorin Picket M.D.   On: 10/10/2019 14:00  ROS - per HPI     OV 06/29/2020  Subjective:  [name], male , DOB: Oct 20, 1940 , age 24 y.o. , MRN: [id number] , AD[address]ESS: [address] [place] [address] PCP [name], MD Patient Care Team: [name], MD as PCP - General (Unknown Physician Specialty) [name], MD as PCP - Electrophysiology (Cardiology) Troy Sine, MD as PCP - Cardiology (Cardiology) Sueanne Margarita, MD as PCP - Sleep Medicine (Cardiology)  This Provider for this visit: Treatment Team:  Attending Provider: Brand Males, MD    06/29/2020 -   Chief Complaint  Patient presents with   Follow-up    SOB unchanged, slight nonproductive cough. Esbriet doing well.   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 Normal echo Jan 2021   HPI Cameron [name] 81 y.o. -returns for follow-up.  He presents with his wife.  Last seen in May 2021.  After that in June 2021 when he started pirfenidone for progressive ILD.  He tells me that he has been tolerating pirfenidone just fine.  Of note he has not had any liver function test since he started pirfenidone.  He is not having any GI side effects of skin side effects from the pirfenidone.  In the last 6 months his ILD stable according to his history.  His walking desaturation test and symptom scores are stable.  He is up-to-date with his Covid vaccine.  He is on leflunomide.  He is on the ILD-pro registry  study with a last visit was in November 2021.  He needs a visit every 6 months.  He is willing to get a Covid IgG checked because he is immunosuppressed.  This is in response to humoral immunity to vaccine     OV 04/09/2021  Subjective:  [name], male , DOB: 1940-08-16 , age 60 y.o. 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 Normal echo Jan 2021  HPI Cameron [name] 81 y.o. -returns for follow-up.  He tells me that in the last 6 months since his last visit he is having progressively more shortness of breath with exertion relieved by rest.  In fact his dyspnea score shows worsening to a total score of 9 compared to earlier in the year.  But there is no worsening cough.  He had pulmonary function test that shows continued stability since 2021 feb but definitely worse since 2019 and 2020.  In other words the stability correlates with him starting pirfenidone.  Explained to him that the pirfenidone is helping his ILD stability.  Nevertheless his symptoms are worse.  He had echocardiogram  yesterday that shows continued elevation in pulmonary artery pressures [2019 borderline elevation] and grade 2 diastolic dysfunction.  Of note he had his ILD-Pro registry research visit today. Last liver function test for drug-induced liver injury monitoring was in February 2022.  GFR at that time was 57.  In March 2022.  His wife has suspected ILD and she is also with him in this visit.  I saw her for a scheduled visit just earlier.      OV 06/13/2021  Subjective:  [name], male , DOB: 12-Aug-1940 , age 30 y.o. , MRN: [id number] , ADDRESS: [place] [id number] PCP [name], MD Patient Care Team: [name], MD as PCP - General (Unknown Physician Specialty) [name], MD as PCP - Electrophysiology (Cardiology) Troy Sine, MD as PCP - Cardiology (Cardiology) Sueanne Margarita, MD as PCP - Sleep Medicine (Cardiology)  This Provider for this visit: Treatment Team:  Attending Provider: Brand Males, MD    06/13/2021 -   Chief Complaint  Patient presents with   Follow-up    Pt is following up after recent heart cath.    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  PVR 2.6 WU. PW mean pressure is 17 mmHg, with V wave 24 c/w his echo documented mild - moderate mitral regurgitation.   HPI JOHNROBERT FOTI 81 y.o. -returns for follow-up to discuss the implications of the right heart cath results.  He had right heart  cath on 05/29/2021.  Around this time he also had some mild anemia that is documented below.  But his recent creatinine is normal.  His right heart cath shows worsening of mean arterial pressure compared to a few years ago but his PVR is low and his wedge is high.  He also has moderate mitral regurgitation and had a high V wave.  Compared to last visit he stable but over time he said worsening dyspnea.  He is here with his wife.  I told them both that this looks like pulmonary venous hypertension.  He does not have edema.  We discussed the possibility of doing Lasix therapy.  I told him he does not meet official criteria for inhaled treprostinil and WHO group 3 pulmonary hypertension.  For inhaled treprostinil he would need a wedge less than 15 and a PVR greater than 3.  He does not have that.  He is agreed to do Lasix at this point.  He will have to follow-up with Dr. [name] for this.  In terms of his interstitial lung disease he has a surveillance CT scan coming up in March 2023 [last CT was then May 2021].  I think we will postpone this and just see him in 4 to 6 months for ILD follow-up given recent stability and pulmonary function test.  What he needs is follow-up for his shortness of breath and to see how the Lasix is performing.  For this we will make a nurse practitioner visit and also encouraged him to go and see Dr. Claiborne Billings.    SYMPTOM SCALE - ILD 10/17/2019  06/29/2020  04/09/2021   O2 use ra ra ra  Shortness of Breath 0 -> 5 scale with 5 being worst (score 6 If unable to do)    At rest 0 0 0  Simple tasks - showers, clothes change, eating, shaving 1 1 0  Household (dishes, doing bed, laundry) 1 0 0  Shopping 0 0 1  Walking level at own pace 2 0 4  Walking up Stairs _0 Total (30-36) Dyspnea Score _1 How bad is your cough? x 1   How bad is your fatigue _2 How bad is nausea 0 0 0  How bad is vomiting?  0 0 0  How bad is diarrhea? 1 0 0  How bad is anxiety? _3 How bad  is depression _4 Simple office walk 185 feet x  3 laps goal with forehead probe 11/03/2017  06/01/2019  06/29/2020  04/09/2021   O2 used Room air Room air  ra  Number laps completed _5 Comments about pace Fast very  99% and 66  Resting Pulse Ox/HR 100% and 70/min 99% and 66/min 97% and 71 100% and Pulse 77  Final Pulse Ox/HR 98% and 121/min 98% and 120 min 98% and 121/min 99% and HR 114  Desaturated </= 88% no     Desaturated <= 3% points no     Got Tachycardic >/= 90/min yes     Symptoms at end of test none Mild dyspea No dyspnea Dyspnea at end  Miscellaneous comments veryu fast Mod pace fast Fast pace   CT Chest data  No results found.    PFT  PFT Results Latest Ref Rng & Units 04/17/2020 10/13/2019 07/21/2019 01/17/2019 11/03/2017 05/28/2017  FVC-Pre L 3.05 3.08 2.92 3.41 3.59 3.29  FVC-Predicted Pre % 86 86 81 94 98 90  FVC-Post L - - - - - 3.28  FVC-Predicted Post % - - - - - 89  Pre FEV1/FVC % % 81 81 82 82 83 87  Post FEV1/FCV % % - - - - - 83  FEV1-Pre L 2.46 2.49 2.40 2.78 2.98 2.85  FEV1-Predicted Pre % 98 98 94 107 114 109  FEV1-Post L - - - - - 2.72  DLCO uncorrected ml/min/mmHg 19.38 19.58 20.08 21.58 20.97 20.35  DLCO UNC% % 87 87 89 96 74 71  DLCO corrected ml/min/mmHg 19.38 19.58 20.08 - - -  DLCO COR %Predicted % 87 87 89 - - -  DLVA Predicted % 105 101 112 104 89 92  TLC L - - - - - 5.47  TLC % Predicted % - - - - - 84  RV % Predicted % - - - - - 80    Latest Reference Range & Units 12/26/15 08:28 09/10/16 05:17 09/11/16 05:29 09/13/16 05:00 09/15/16 04:03 04/02/17 12:04 08/11/17 11:18 08/12/17 03:41 11/10/18 08:10 08/02/19 18:36 07/25/20 15:43 05/23/21 11:51 05/29/21 08:48  Hemoglobin 13.0 - 17.0 g/dL 13.0 - 17.0 g/dL 15.6 12.6 (L) 12.3 (L) 11.7 (L) 11.4 (L) 13.7 13.4 11.5 (L) 13.0 13.4 14.3 12.4 (L) 11.2 (L) 10.9 (L)  (L): Data is abnormally low  Latest Reference Range & Units 12/09/11 10:46 12/10/11 05:20 12/26/15 08:28 09/09/16 15:57  09/10/16 05:17 09/11/16 05:29 09/12/16 04:36 09/13/16 05:00 09/14/16 07:00 09/15/16 04:03 04/02/17 12:04 05/15/17 07:43 08/11/17 11:18 08/12/17 03:41 03/22/18 10:12 04/02/18 10:02 07/13/19 11:58 07/21/19 15:32 08/01/19 09:30 08/02/19 18:36 07/25/20 15:43 05/23/21 11:51  Creatinine 0.76 - 1.27 mg/dL 1.25 0.94 1.10 3.60 (H) 2.92 (H) 1.99 (H) 1.48 (H) 1.17 1.44 (H) 1.22 1.09 1.00 1.03 0.99 0.91 0.94 1.23 1.25 1.04 1.26 (H) 1.27 1.15  (H): Data is abnormally high   has a past medical history of Appendicitis, Atrial fibrillation ([place]) (12/12/2013), BPH (benign prostatic hypertrophy), CAD (coronary artery disease) (12/2015), Chest pain (12/03/2015), CHF (congestive heart failure) (H[facility]), Complete heart block ([place]), Coronary artery disease, Coronary artery disease involving native coronary artery of native heart with unstable angina pectoris ([place]) (08/11/2017), History of blood transfusion (1968), History of kidney stones, History of SCC (squamous cell carcinoma) of skin (07/24/2020), Hyperglycemia (11/05/2013), Hyperlipidemia (11/05/2013), Hypertension, Hypothyroidism, Hypothyroidism, unspecified (11/05/2013), Inflammatory arthritis (11/05/2013), Onychomycosis (12/20/2015), OSA (obstructive sleep apnea) (10/26/2017), OSA on CPAP, 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   reports that he quit smoking about 48 years ago. His smoking use included cigarettes. He has a 5.00 pack-year smoking history. He quit smokeless tobacco use about 48 years ago.  His smokeless tobacco use included chew.  Past Surgical History:  Procedure Laterality  Date   BACK SURGERY     CARDIAC CATHETERIZATION N/A 12/28/2015   Procedure: Left Heart Cath and Coronary Angiography;  Surgeon: Belva Crome, MD;  Location: [facility];  Service: Cardiovascular;  Laterality: N/A;   CATARACT EXTRACTION W/ INTRAOCULAR LENS  IMPLANT, BILATERAL  Bilateral    CORONARY ANGIOPLASTY WITH STENT PLACEMENT  08/11/2017   "2 stents"   CORONARY STENT INTERVENTION N/A 08/11/2017   Procedure: CORONARY STENT INTERVENTION;  Surgeon: Troy Sine, MD;  Location: [facility];  Service: Cardiovascular;  Laterality: N/A;   CYSTOSCOPY W/ STONE MANIPULATION     INGUINAL HERNIA REPAIR Left    INSERT / REPLACE / REMOVE PACEMAKER  12/09/2011   SJM Acc[employer] DR RF implanted by DR Allred for complete heart block and syncope   JOINT REPLACEMENT     LAPAROSCOPIC CHOLECYSTECTOMY     LEAD REVISION/REPAIR N/A 08/09/2020   Procedure: LEAD REVISION/REPAIR;  Surgeon: [name], MD;  Location: [facility];  Service: Cardiovascular;  Laterality: N/A;   LITHOTRIPSY     LUMBAR [place]     "removed arthritis and spurs"   PERMANENT PACEMAKER INSERTION N/A 12/09/2011   Procedure: PERMANENT PACEMAKER INSERTION;  Surgeon: [name], MD;  Location[facility];  Service: Cardiovascular;  Laterality: N/A;   PPM GENERATOR CHANGEOUT N/A 08/09/2020   Procedure: PPM GENERATOR CHANGEOUT;  Surgeon: [name], MD;  Location: [facility];  Service: Cardiovascular;  Laterality: N/A;   REPLACEMENT TOTAL KNEE Right    RIGHT HEART CATH N/A 05/29/2021   Procedure: RIGHT HEART CATH;  Surgeon: Troy Sine, MD;  Location: [facility];  Service: Cardiovascular;  Laterality: N/A;   RIGHT/LEFT HEART CATH AND CORONARY ANGIOGRAPHY N/A 08/11/2017   Procedure: RIGHT/LEFT HEART CATH AND CORONARY ANGIOGRAPHY;  Surgeon: Larey Dresser, MD;  Location: [facility];  Service: Cardiovascular;  Laterality: N/A;   TRANSURETHRAL RESECTION OF PROSTATE  2017/2018    Allergies  Allergen Reactions   Ace Inhibitors Swelling    Tongue swelling, angioedema   Lisinopril     Other reaction(s): Lip swelling, O/E - lip swelling   Celecoxib Rash    Skin rash     Immunization History  Administered Date(s) Administered   Fluad Quad(high Dose 65+) 02/11/2020    Influenza, High Dose Seasonal PF 03/26/2017   Influenza-Unspecified 05/03/2015, 02/13/2016, 03/26/2016, 03/19/2017, 03/03/2018, 03/17/2019, 04/09/2020   PFIZER Comirnaty(Gray Top)Covid-19 Tri-Sucrose Vaccine 06/11/2019, 07/02/2019, 07/04/2019, 07/18/2019, 03/01/2020, 11/15/2020   PFIZER(Purple Top)SARS-COV-2 Vaccination 06/11/2019, 07/04/2019, 07/18/2019, 03/01/2020   Pneumococcal Conjugate-13 02/11/2020   Pneumococcal Polysaccharide-23 12/10/2011, 01/30/2018   Pneumococcal-Unspecified 03/26/2016, 02/04/2018   Tdap 01/17/2016, 02/04/2018   Zoster Recombinat (Shingrix) 08/14/2020, 11/29/2020    Family History  Problem Relation Age of Onset   Pancreatic cancer Mother    Bone cancer Father    Alzheimer's disease Sister    Heart attack Paternal Uncle    Arthritis Maternal Grandfather    Alzheimer's disease Paternal Grandmother    Lung cancer Paternal Grandfather      Current Outpatient Medications:    acetaminophen (TYLENOL) 500 MG tablet, Take 500 mg by mouth every 6 (six) hours as needed for moderate pain., Disp: , Rfl:    aspirin 81 MG EC tablet, Take 81 mg by mouth daily., Disp: , Rfl:    Cinnamon 500 MG TABS, Take 500 mg by mouth daily., Disp: , Rfl:    clobetasol (OLUX) 0.05 % topical foam, Apply twice daily to  affected areas on scalp, ears and elbow. Avoid applying to face, groin, and axilla., Disp: 50 g, Rfl: 2   diltiazem (CARDIZEM CD) 120 MG 24 hr capsule, Take 120 mg by mouth daily., Disp: , Rfl:    gabapentin (NEURONTIN) 300 MG capsule, Take 300 mg by mouth at bedtime., Disp: , Rfl:    glimepiride (AMARYL) 2 MG tablet, Take 2 mg by mouth every morning., Disp: , Rfl:    JARDIANCE 10 MG TABS tablet, Take 10 mg by mouth daily., Disp: , Rfl:    ketoconazole (NIZORAL) 2 % cream, Apply 1 application topically daily as needed for irritation., Disp: 15 g, Rfl: 1   leflunomide (ARAVA) 10 MG tablet, Take 10 mg by mouth daily., Disp: , Rfl:    nebivolol (BYSTOLIC) 5 MG tablet, Take  5 mg by mouth daily., Disp: , Rfl:    nitroGLYCERIN (NITROSTAT) 0.4 MG SL tablet, Place 1 tablet (0.4 mg total) under the tongue every 5 (five) minutes as needed for chest pain., Disp: 75 tablet, Rfl: 2   Omega-3 Fatty Acids (OMEGA-3 FISH OIL PO), Take 1 capsule by mouth daily., Disp: , Rfl:    Pirfenidone (ESBRIET) 267 MG TABS, TAKE 3 TABLETS THREE TIMES A DAY WITH MEALS (MAINTENANCE DOSE, START AFTER COMPLETED STARTER DOSE), Disp: 270 tablet, Rfl: 11   pravastatin (PRAVACHOL) 20 MG tablet, Take 1 tablet (20 mg total) by mouth every evening. *NEEDS OFFICE VISIT FOR FURTHER REFILLS*, Disp: 90 tablet, Rfl: 0   rivaroxaban (XARELTO) 20 MG TABS tablet, Take 1 tablet (20 mg total) by mouth daily with supper., Disp: 90 tablet, Rfl: 2   Tapinarof (VTAMA) 1 % CREA, Apply 1 application topically daily., Disp: 60 g, Rfl: 0   telmisartan (MICARDIS) 80 MG tablet, Take 80 mg by mouth daily., Disp: , Rfl:    terbinafine (LAMISIL) 1 % cream, Apply once daily to buttocks and left thigh rash until clear, Disp: 42 g, Rfl: 2   thyroid (ARMOUR) 90 MG tablet, Take 90 mg by mouth every morning. , Disp: , Rfl:    Trospium Chloride 60 MG CP24, TAKE 1 CAPSULE BY MOUTH DAILY, Disp: 30 capsule, Rfl: 2   TURMERIC PO, Take 1 capsule by mouth daily., Disp: , Rfl:   Current Facility-Administered Medications:    sodium chloride flush (NS) 0.9 % injection 3 mL, 3 mL, Intravenous, Q12H, Troy Sine, MD  Facility-Administered Medications Ordered in Other Visits:    ondansetron (ZOFRAN) 4 mg in sodium chloride 0.9 % 50 mL IVPB, 4 mg, Intravenous, Q6H PRN, Ashok Pall, MD      Objective:   Vitals:   06/13/21 1459  BP: 118/64  Pulse: 71  Temp: (!) 97.4 F (36.3 C)  TempSrc: Oral  SpO2: 98%  Weight: 189 lb 3.2 oz (85.8 kg)  Height: _0  (1.702 m)    Estimated body mass index is 29.63 kg/m as calculated from the following:   Height as of this encounter: _1  (1.702 m).   Weight as of this encounter: 189 lb  3.2 oz (85.8 kg).  _2 @  Filed Weights   06/13/21 1459  Weight: 189 lb 3.2 oz (85.8 kg)     Physical Exam  General: No distress. Looks well Neuro: Alert and Oriented x 3. GCS 15. Speech normal Psych: Pleasant Resp:  Barrel Chest - no.  Wheeze - no, Crackles - yes at lung base, No overt respiratory distress CVS: Normal heart sounds. Murmurs - no Ext: Stigmata of Connective Tissue Disease -  no HEENT: Normal upper airway. PEERL +. No post nasal drip        Assessment:       ICD-10-CM   1. Interstitial lung disease due to connective tissue disease ([place])  J84.89    M35.9     2. History of rheumatoid arthritis  Z87.39     3. H/O agent Orange exposure  Z77.098     4. Dyspnea on exertion  R06.09     5. Other secondary pulmonary hypertension (HCC)  I27.29     6. Grade II diastolic dysfunction  X64.68          Plan:     Patient Instructions  Interstitial lung disease due to connective tissue disease ([place]) History of rheumatoid arthritis and immunosuppressed on leflunomide Hx of Agent Orange Exposure   - Pulmonary Fibrosis based on PFT and walk test is every so slowly and steadiy progressive  sinc 2019 and 2020 but stable since  visit May 2021 and starting esbriet in June 2021 through Nov 2022  Plan - check LFT 06/13/2021  - -Continue Esbriet per protocol [eat with food, space adequately between meals and apply sunscreen at all times] -Continue daily exercise as before - do sprimetyr and dlco in 4-6 months - change HRCT in march 20232 to 4-6 months   Shortness of breath Grade 2 diastolic dysfunction Pulmonary venous hypertension  -  you have increased pulmonary pressures because of stifff heart muscle and valve leak  Plan - check bmet 06/13/2021 - no role for tyvaso as yet  - start lasix 42m per day with KCL 162m per day - please follow with Dr KeClaiborne Billings Follow-up --15 min video vwith with APP in 4 weeks to see response to lasix  - 30 min face  to face with Dr RaChase Callern 4-6 months but after PFT and CT  - ild symptm question and simple walk test at followup    SIGNATURE    Dr. MuBrand MalesM.D., F.C.C.P,  Pulmonary and Critical Care Medicine Staff Physician, CoMarsingirector - Interstitial Lung Disease  Program  Pulmonary [id number]: [phone] no answer or between  15:00h - 7:00h: call [phone] Telephone: [phone]  3:30 PM 06/13/2021

## 2021-06-13 NOTE — Patient Instructions (Addendum)
Continue Vtama as directed to all areas with psoriasis.   Resume Clobetasol foam twice to affected areas on scalp, behind ears, elbow. Avoid applying to face, groin, and axilla. Use as directed. Long-term use can cause thinning of the skin.  Start Terbinafine cream every day to affected areas on buttocks and left thigh until clear.  Stop Wynzora cream. Stop Ketoconazole cream.  Topical steroids (such as triamcinolone, fluocinolone, fluocinonide, mometasone, clobetasol, halobetasol, betamethasone, hydrocortisone) can cause thinning and lightening of the skin if they are used for too long in the same area. Your physician has selected the right strength medicine for your problem and area affected on the body. Please use your medication only as directed by your physician to prevent side effects.     Gentle Skin Care Guide  1. Bathe no more than once a day.  2. Avoid bathing in hot water  3. Use a mild soap like Dove, Vanicream, Cetaphil, CeraVe. Can use Lever 2000 or Cetaphil antibacterial soap  4. Use soap only where you need it. On most days, use it under your arms, between your legs, and on your feet. Let the water rinse other areas unless visibly dirty.  5. When you get out of the bath/shower, use a towel to gently blot your skin dry, don't rub it.  6. While your skin is still a little damp, apply a moisturizing cream such as Vanicream, CeraVe, Cetaphil, Eucerin, Sarna lotion or plain Vaseline Jelly. For hands apply Neutrogena [place] Hand Cream or Excipial Hand Cream.  7. Reapply moisturizer any time you start to itch or feel dry.  8. Sometimes using free and clear laundry detergents can be helpful. Fabric softener sheets should be avoided. Downy Free & Gentle liquid, or any liquid fabric softener that is free of dyes and perfumes, it acceptable to use  9. If your doctor has given you prescription creams you may apply moisturizers over them    If You Need Anything After Your  Visit  If you have any questions or concerns for your doctor, please call our main line at [phone] and press option 4 to reach your doctor's medical assistant. If no one answers, please leave a voicemail as directed and we will return your call as soon as possible. Messages left after 4 pm will be answered the following business day.   You may also send [place] a message via [place]. We typically respond to MyChart messages within 1-2 business days.  For prescription refills, please ask your pharmacy to contact our office. Our fax number is [phone].  If you have an urgent issue when the clinic is closed that cannot wait until the next business day, you can page your doctor at the number below.    Please note that while we do our best to be available for urgent issues outside of office hours, we are not available 24/7.   If you have an urgent issue and are unable to reach [place], you may choose to seek medical care at your doctor's office, retail clinic, urgent care center, or emergency room.  If you have a medical emergency, please immediately call 911 or go to the emergency department.  Pager Numbers  - Dr. Nehemiah Massed: [phone]  - Dr. Laurence Ferrari: [phone]  - Dr. Nicole Kindred: [phone]  In the event of inclement weather, please call our main line at [phone] for an update on the status of any delays or closures.  Dermatology Medication Tips: Please keep the boxes that topical medications come in in  order to help keep track of the instructions about where and how to use these. Pharmacies typically print the medication instructions only on the boxes and not directly on the medication tubes.   If your medication is too expensive, please contact our office at [phone] option 4 or send [place] a message through [place].   We are unable to tell what your co-pay for medications will be in advance as this is different depending on your insurance coverage. However, we may be able to find a  substitute medication at lower cost or fill out paperwork to get insurance to cover a needed medication.   If a prior authorization is required to get your medication covered by your insurance company, please allow [place] 1-2 business days to complete this process.  Drug prices often vary depending on where the prescription is filled and some pharmacies may offer cheaper prices.  The website www.goodrx.com contains coupons for medications through different pharmacies. The prices here do not account for what the cost may be with help from insurance (it may be cheaper with your insurance), but the website can give you the price if you did not use any insurance.  - You can print the associated coupon and take it with your prescription to the pharmacy.  - You may also stop by our office during regular business hours and pick up a GoodRx coupon card.  - If you need your prescription sent electronically to a different pharmacy, notify our office through [facility] or by phone at [phone] option 4.     Si Usted Necesita Algo Despus de Su Visita  Tambin puede enviarnos un mensaje a travs de Pharmacist, community. Por lo general respondemos a los mensajes de MyChart en el transcurso de 1 a 2 das hbiles.  Para renovar recetas, por favor pida a su farmacia que se ponga en contacto con nuestra oficina. Harland Dingwall de fax es [place] [phone].  Si tiene un asunto urgente cuando la clnica est cerrada y que no puede esperar hasta el siguiente da hbil, puede llamar/localizar a su doctor(a) al nmero que aparece a continuacin.   Por favor, tenga en cuenta que aunque hacemos todo lo posible para estar disponibles para asuntos urgentes fuera del horario de [place], no estamos disponibles las 24 horas del da, los 7 das de la [place].   Si tiene un problema urgente y no puede comunicarse con nosotros, puede optar por buscar atencin mdica  en el consultorio de su doctor(a), en una clnica privada, en un  centro de atencin urgente o en una sala de emergencias.  Si tiene Engineering geologist, por favor llame inmediatamente al 911 o vaya a la sala de emergencias.  Nmeros de bper  - Dr. Nehemiah Massed: [phone]  - Dra. Moye: [phone]  - Dra. Nicole Kindred: [phone]  En caso de inclemencias del [place], por favor llame a Johnsie Kindred principal al [phone] para una actualizacin sobre el [place] de cualquier retraso o cierre.  Consejos para la medicacin en dermatologa: Por favor, guarde las cajas en las que vienen los medicamentos de uso tpico para ayudarle a seguir las instrucciones sobre dnde y cmo usarlos. Las farmacias generalmente imprimen las instrucciones del medicamento slo en las cajas y no directamente en los tubos del [place].   Si su medicamento es muy caro, por favor, pngase en contacto con Zigmund Daniel llamando al [phone] y presione la opcin 4 o envenos un mensaje a travs de Pharmacist, community.   No podemos decirle cul ser su  copago por los medicamentos por adelantado ya que esto es diferente dependiendo de la cobertura de su seguro. Sin embargo, es posible que podamos encontrar un medicamento sustituto a Electrical engineer un formulario para que el seguro cubra el medicamento que se considera necesario.   Si se requiere una autorizacin previa para que su compaa de seguros [place] su medicamento, por favor permtanos de 1 a 2 das hbiles para completar este proceso.  Los precios de los medicamentos varan con frecuencia dependiendo del Environmental consultant de dnde se surte la receta y alguna farmacias pueden ofrecer precios ms baratos.  El sitio web www.goodrx.com tiene cupones para medicamentos de Airline pilot. Los precios aqu no tienen en cuenta lo que podra costar con la ayuda del seguro (puede ser ms barato con su seguro), pero el sitio web puede darle el precio si no utiliz Research scientist (physical sciences).  - Puede imprimir el cupn correspondiente y llevarlo con su receta  a la farmacia.  - Tambin puede pasar por nuestra oficina durante el horario de atencin regular y Charity fundraiser una tarjeta de cupones de GoodRx.  - Si necesita que su receta se enve electrnicamente a una farmacia diferente, informe a nuestra oficina a travs de MyChart de [place] o por telfono llamando al [phone] y presione la opcin 4.

## 2021-06-13 NOTE — Progress Notes (Signed)
° °  Follow-Up Visit   Subjective  Cameron [name] is a 81 y.o. male who presents for the following: Psoriasis (5 month recheck. Elbows, scalp, eyebrows, gluteal crease. Patient reports forehead and right ear are improving but psoriasis is now at left ear. Uses Vtama at ears. Uses Wynzora on elbows. ) and Rash (Recheck buttocks. Is using Ketoconazole 2% cream as directed. Jaquita Rector was performed in office and positive for hyphae).  The following portions of the chart were reviewed this encounter and updated as appropriate:  Tobacco   Allergies   Meds   Problems   Med Hx   Surg Hx   Fam Hx       Review of Systems: No other skin or systemic complaints except as noted in HPI or Assessment and Plan.   Objective  Well appearing patient in no apparent distress; mood and affect are within normal limits.  A focused examination was performed including face, scalp, arms, buttocks. Relevant physical exam findings are noted in the Assessment and Plan.  elbows, scalp, eyebrows, gluteal crease, B/L ears, postauricular Well-demarcated erythematous papules/plaques with silvery scale, guttate pink scaly papules.   Buttock left,  post thigh erythematous scaly somewhat cigarette paper-like patches   Assessment & Plan  Psoriasis elbows, scalp, eyebrows, gluteal crease, B/L ears, postauricular  Chronic condition with duration or expected duration over one year. Condition is bothersome to patient. Not currently at goal.  Psoriasis is a chronic non-curable, but treatable genetic/hereditary disease that may have other systemic features affecting other organ systems such as joints (Psoriatic Arthritis). It is associated with an increased risk of inflammatory bowel disease, heart disease, non-alcoholic fatty liver disease, and depression.    Continue Vtama to all areas with psoriasis.   Resume Clobetasol foam BID to affected areas on scalp, ears, elbow. Avoid applying to face, groin, and axilla. Use as directed.  Long-term use can cause thinning of the skin.  Topical steroids (such as triamcinolone, fluocinolone, fluocinonide, mometasone, clobetasol, halobetasol, betamethasone, hydrocortisone) can cause thinning and lightening of the skin if they are used for too long in the same area. Your physician has selected the right strength medicine for your problem and area affected on the body. Please use your medication only as directed by your physician to prevent side effects.    clobetasol (OLUX) 0.05 % topical foam - elbows, scalp, eyebrows, gluteal crease, B/L ears, postauricular Apply twice daily to affected areas on scalp, ears and elbow. Avoid applying to face, groin, and axilla.  Rash Buttock left,  post thigh  Previously with positive KOH. Not cleared despite ketoconazole cream.  D/c ketoconazole cream. Start Terbinafine cream every day to affected areas on buttocks and left thigh.   If not improving at next visit consider broad shave biopsy to r/o mycosis fungoides.  terbinafine (LAMISIL) 1 % cream - Buttock left,  post thigh Apply once daily to buttocks and left thigh rash until clear   Return in about 6 months (around 12/11/2021) for TBSE, Rash Follow Up.  I, Cameron [name], CMA, am acting as scribe for Cameron Gleason, MD.  Documentation: I have reviewed the above documentation for accuracy and completeness, and I agree with the above.  Cameron Gleason, MD

## 2021-06-24 ENCOUNTER — Encounter: Payer: Self-pay | Admitting: Dermatology

## 2021-06-25 DIAGNOSIS — E118 Type 2 diabetes mellitus with unspecified complications: Secondary | ICD-10-CM | POA: Insufficient documentation

## 2021-06-26 ENCOUNTER — Telehealth: Payer: Self-pay | Admitting: Internal Medicine

## 2021-06-26 DIAGNOSIS — J849 Interstitial pulmonary disease, unspecified: Secondary | ICD-10-CM

## 2021-06-26 NOTE — Telephone Encounter (Signed)
Called ad spoke with wife and she states that at the last office visit patient was placed on 40m of lasix and now pt is have issues controlling his bladder and the wife wants to know what Dr RChase Callerrecommends.   Dr RChase Callerplease advise

## 2021-06-27 NOTE — Telephone Encounter (Signed)
Hold lasix for now Ultimately needs to sort this out with Dr Claiborne Billings His creat went up in Jan 2023 Needs to recheck BMET next week    Latest Reference Range & Units 12/09/11 10:46 12/10/11 05:20 12/26/15 08:28 09/09/16 15:57 09/10/16 05:17 09/11/16 05:29 09/12/16 04:36 09/13/16 05:00 09/14/16 07:00 09/15/16 04:03 04/02/17 12:04 05/15/17 07:43 08/11/17 11:18 08/12/17 03:41 03/22/18 10:12 04/02/18 10:02 07/13/19 11:58 07/21/19 15:32 08/01/19 09:30 08/02/19 18:36 07/25/20 15:43 05/23/21 11:51 06/13/21 15:35  Creatinine 0.40 - 1.50 mg/dL 1.25 0.94 1.10 3.60 (H) 2.92 (H) 1.99 (H) 1.48 (H) 1.17 1.44 (H) 1.22 1.09 1.00 1.03 0.99 0.91 0.94 1.23 1.25 1.04 1.26 (H) 1.27 1.15 1.61 (H)  (H): Data is abnormally high

## 2021-06-28 NOTE — Telephone Encounter (Signed)
Lm for patient.

## 2021-06-28 NOTE — Telephone Encounter (Signed)
Both Patient and patient's spouse, Loletha Carrow are aware of results and voiced their understanding. They will come by next week for labs.  Order placed. Nothing further needed at this time.

## 2021-07-01 ENCOUNTER — Other Ambulatory Visit: Payer: Medicare Other

## 2021-07-01 DIAGNOSIS — J849 Interstitial pulmonary disease, unspecified: Secondary | ICD-10-CM

## 2021-07-01 LAB — BASIC METABOLIC PANEL
BUN: 27 mg/dL — ABNORMAL HIGH (ref 6–23)
CO2: 26 mEq/L (ref 19–32)
Calcium: 9.5 mg/dL (ref 8.4–10.5)
Chloride: 103 mEq/L (ref 96–112)
Creatinine, Ser: 1.07 mg/dL (ref 0.40–1.50)
GFR: 65.2 mL/min (ref >60.00)
Glucose, Bld: 161 mg/dL — ABNORMAL HIGH (ref 70–99)
Potassium: 4.2 mEq/L (ref 3.5–5.1)
Sodium: 135 mEq/L (ref 135–145)

## 2021-07-04 ENCOUNTER — Other Ambulatory Visit: Payer: Self-pay | Admitting: Urology

## 2021-07-10 ENCOUNTER — Telehealth: Payer: Self-pay | Admitting: Internal Medicine

## 2021-07-10 NOTE — Telephone Encounter (Signed)
I called the patient and wanted to verify if he was coming in person or if he was doing a mychart visit. Waiting on a call back.

## 2021-07-11 ENCOUNTER — Other Ambulatory Visit: Payer: Self-pay

## 2021-07-11 ENCOUNTER — Telehealth (INDEPENDENT_AMBULATORY_CARE_PROVIDER_SITE_OTHER): Payer: Medicare Other | Admitting: Nurse Practitioner

## 2021-07-11 ENCOUNTER — Ambulatory Visit: Payer: Medicare Other | Admitting: Internal Medicine

## 2021-07-11 ENCOUNTER — Encounter: Payer: Self-pay | Admitting: Nurse Practitioner

## 2021-07-11 DIAGNOSIS — T50905A Adverse effect of unspecified drugs, medicaments and biological substances, initial encounter: Secondary | ICD-10-CM

## 2021-07-11 DIAGNOSIS — M0600 Rheumatoid arthritis without rheumatoid factor, unspecified site: Secondary | ICD-10-CM

## 2021-07-11 DIAGNOSIS — J849 Interstitial pulmonary disease, unspecified: Secondary | ICD-10-CM

## 2021-07-11 DIAGNOSIS — I272 Pulmonary hypertension, unspecified: Secondary | ICD-10-CM | POA: Diagnosis not present

## 2021-07-11 HISTORY — DX: Adverse effect of unspecified drugs, medicaments and biological substances, initial encounter: T50.905A

## 2021-07-11 NOTE — Assessment & Plan Note (Signed)
Followed by rheumatology. On Arava.  °

## 2021-07-11 NOTE — Assessment & Plan Note (Signed)
Mild on recent right heart cath. Started on lasix 20 mg daily; however, pt stopped taking due to bladder problems. Was instructed by PCP that this was ok. He is to follow up with Dr. Claiborne Billings in March. Advised that if he develops any new lower extremity swelling, orthopnea, PND, or changes in his breathing, he needs to notify us or Dr. Claiborne Billings and likely will need to resume therapy.

## 2021-07-11 NOTE — Patient Instructions (Addendum)
-  Continue Esbriet 3 tablets Three times a day with meals. Wear sunscreen when out in the sun -Stop potassium supplement if not taking lasix  Follow up with Dr. Claiborne Billings to discuss next steps for pulmonary hypertension treatment since unable to tolerate lasix. Notify if you develop worsening SOB, leg swelling, orthopnea, or PND.   Notify your PCP about your rash after starting Jardiance. Seek emergency care if facial/tongue swelling develop or changes in your breathing.   PFTs scheduled for 5/9 with follow up with [name] after.   Someone will contact you from our office to schedule your HRCT before this.   Follow up as scheduled with [name]. If symptoms do not improve or worsen, please contact office for sooner follow up or seek emergency care.

## 2021-07-11 NOTE — Assessment & Plan Note (Addendum)
Breathing stable. Tolerating Esbriet well with recent stable LFTs. Plans for repeat HRCT and PFTs in May.   Patient Instructions  -Continue Esbriet 3 tablets Three times a day with meals. Wear sunscreen when out in the sun -Stop potassium supplement if not taking lasix  Follow up with Dr. Claiborne Billings to discuss next steps for pulmonary hypertension treatment since unable to tolerate lasix. Notify if you develop worsening SOB, leg swelling, orthopnea, or PND.   Notify your PCP about your rash after starting Jardiance. Seek emergency care if facial/tongue swelling develop or changes in your breathing.   PFTs scheduled for 5/9 with follow up with [name] after.   Someone will contact you from our office to schedule your HRCT before this.   Follow up as scheduled with [name]. If symptoms do not improve or worsen, please contact office for sooner follow up or seek emergency care.

## 2021-07-11 NOTE — Progress Notes (Signed)
[name], male     DOB: [birth year], 81 y.o.      MRN: [id number]  Chief Complaint  Patient presents with   Follow-up    Patient feels like breathing is good, no concerns    Virtual Visit via Video Note  I connected with Cameron [name] on 07/11/21 at  9:30 AM EST by a video enabled telemedicine application and verified that I am speaking with the correct person using two identifiers.  Location: Patient: Home Provider: Office   I discussed the limitations of evaluation and management by telemedicine and the availability of in person appointments. The patient expressed understanding and agreed to proceed.  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  He is a patient of Dr. Golden Pop and last seen in office 06/13/2021.  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  06/13/2021: OV with [name]. Breathing stable since previous visit but progressive DOE over time. Recent right heart cath with evidence of mild pulmonary HTN with elevated RV 54/12; PA pressure 55/18; mean 31 mmHg; mild to moderate MR. Initiated on lasix therapy 20 mg daily and advised to follow up with Dr. Claiborne Billings. LFTs stable on Esbriet. Plans for HRCT in 4-6 months and repeat PFTs.   07/11/2021: Today - follow up Patient presents today with wife via virtual visit for follow up after being started on lasix for mild pulmonary HTN. He states he started the medication but has since stopped due to some bladder issues. He reports his breathing has been stable and overall, he feels pretty well. He denies any lower extremity swelling, palpitations, orthopnea, PND, cough or wheezing. He is doing well on Esbriet. He  has developed a rash over the last 3-4 days after being transitioned from metformin to [place]. He denies any facial or tongue swelling, difficulties breathing or swallowing, or N/V.   Allergies  Allergen Reactions   Ace Inhibitors Swelling    Tongue swelling, angioedema   Lisinopril     Other reaction(s): Lip swelling, O/E - lip swelling   Celecoxib Rash    Skin rash    Immunization History  Administered Date(s) Administered   Fluad Quad(high Dose 65+) 02/11/2020   Influenza Inj Mdck Quad Pf 03/25/2021   Influenza, High Dose Seasonal PF 03/26/2017   Influenza-Unspecified 05/03/2015, 02/13/2016, 03/26/2016, 03/19/2017, 03/03/2018, 03/17/2019, 04/09/2020, 04/18/2021   PFIZER Comirnaty(Gray Top)Covid-19 Tri-Sucrose Vaccine 06/11/2019, 07/02/2019, 07/04/2019, 07/18/2019, 03/01/2020, 11/15/2020   PFIZER(Purple Top)SARS-COV-2 Vaccination 06/11/2019, 07/04/2019, 07/18/2019, 03/01/2020   Pneumococcal Conjugate-13 02/11/2020   Pneumococcal Polysaccharide-23 12/10/2011, 01/30/2018   Pneumococcal-Unspecified 03/26/2016, 02/04/2018   Tdap 01/17/2016, 02/04/2018   Zoster Recombinat (Shingrix) 08/14/2020, 11/29/2020   Past Medical History:  Diagnosis Date   Appendicitis    Atrial fibrillation ([place]) 12/12/2013   BPH (benign prostatic hypertrophy)    CAD (coronary artery disease) 12/2015   Cath by Dr [name] reveals distal and small vessel CAD.  Medical therapy advised.   Chest pain 12/03/2015   CHF (congestive heart failure) (HCC)    Complete heart block ([place])    s/p PPM   Coronary artery disease    Coronary artery disease involving native coronary artery of native heart with unstable angina pectoris ([place]) 08/11/2017   History of blood transfusion 1968   "probably; related to getting  [place]   History of kidney stones    History of SCC (squamous cell carcinoma) of skin 07/24/2020   right upper arm/excision   Hyperglycemia 11/05/2013   Hyperlipidemia 11/05/2013   Hypertension     Hypothyroidism    Hypothyroidism, unspecified 11/05/2013   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  AHI was 20/h while supine.  Oxygen saturations dropped to 87%.  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   Rheumatoid arthritis ([place])    "hands" (08/11/2017)   Type II diabetes mellitus ([place])     Tobacco History: Social History   Tobacco Use  Smoking Status Former   Packs/day: 1.00   Years: 5.00   Pack years: 5.00   Types: Cigarettes   Quit date: 07/10/1972   Years since quitting: 49.0  Smokeless Tobacco Former   Types: Chew   Quit date: 1975   Counseling given: Not Answered   Outpatient Medications Prior to Visit  Medication Sig Dispense Refill   acetaminophen (TYLENOL) 500 MG tablet Take 500 mg by mouth every 6 (six) hours as needed for moderate pain.     aspirin 81 MG EC tablet Take 81 mg by mouth daily.     Cinnamon 500 MG TABS Take 500 mg by mouth daily.     clobetasol (OLUX) 0.05 % topical foam Apply twice daily to affected areas on scalp, ears and elbow. Avoid applying to face, groin, and axilla. 50 g 2   diltiazem (CARDIZEM CD) 120 MG 24 hr capsule Take 120 mg by mouth daily.     furosemide (LASIX) 20 MG tablet Take 1 tablet (20 mg total) by mouth daily. 30 tablet 3   gabapentin (NEURONTIN) 300 MG capsule Take 300 mg by mouth at bedtime.     glimepiride (AMARYL) 2 MG tablet Take 2 mg by mouth every morning.     JARDIANCE 10 MG TABS tablet Take 10 mg by mouth daily.     ketoconazole (NIZORAL) 2 % cream Apply 1 application topically daily as needed for irritation. 15 g 1   leflunomide (ARAVA) 10 MG tablet Take 10 mg by mouth daily.     nebivolol (BYSTOLIC) 5 MG tablet Take 5 mg by mouth daily.     nitroGLYCERIN (NITROSTAT) 0.4 MG SL tablet Place 1 tablet (0.4 mg total) under  the tongue every 5 (five) minutes as needed for chest pain. 75 tablet 2   Omega-3 Fatty Acids (OMEGA-3 FISH OIL PO) Take 1 capsule by mouth daily.     Pirfenidone (ESBRIET) 267 MG TABS TAKE 3 TABLETS THREE TIMES A DAY WITH MEALS (MAINTENANCE DOSE, START AFTER COMPLETED STARTER DOSE) 270 tablet 11   potassium chloride (KLOR-CON M) 10 MEQ tablet Take 1 tablet (10 mEq total) by mouth daily. 30 tablet 3   pravastatin (PRAVACHOL) 20 MG tablet Take 1 tablet (20 mg total) by mouth every evening. *NEEDS OFFICE VISIT FOR FURTHER REFILLS* 90 tablet 0   rivaroxaban (XARELTO) 20 MG TABS tablet Take 1 tablet (20 mg total) by mouth daily with supper. 90 tablet 2   Tapinarof (VTAMA) 1 % CREA Apply 1 application topically daily. 60 g 0   telmisartan (MICARDIS) 80 MG tablet Take 80 mg by mouth daily.     terbinafine (LAMISIL) 1 % cream Apply  once daily to buttocks and left thigh rash until clear 42 g 2   thyroid (ARMOUR) 90 MG tablet Take 90 mg by mouth every morning.      Trospium Chloride 60 MG CP24 TAKE 1 CAPSULE BY MOUTH DAILY 30 capsule 2   TURMERIC PO Take 1 capsule by mouth daily.     Facility-Administered Medications Prior to Visit  Medication Dose Route Frequency Provider Last Rate Last Admin   ondansetron (ZOFRAN) 4 mg in sodium chloride 0.9 % 50 mL IVPB  4 mg Intravenous Q6H PRN Ashok Pall, MD       sodium chloride flush (NS) 0.9 % injection 3 mL  3 mL Intravenous Q12H Troy Sine, MD         Review of Systems:   Constitutional: No weight loss or gain, night sweats, fevers, chills, fatigue, or lassitude. HEENT: No headaches, difficulty swallowing, tooth/dental problems, or sore throat. No sneezing, itching, ear ache, nasal congestion, or post nasal drip CV:  No chest pain, orthopnea, PND, swelling in lower extremities, anasarca, dizziness, palpitations, syncope Resp: +shortness of breath with exertion (baseline). No excess mucus or change in color of mucus. No productive or non-productive.  No hemoptysis. No wheezing.  No chest wall deformity GI:  No heartburn, indigestion, abdominal pain, nausea, vomiting, diarrhea, change in bowel habits, loss of appetite, bloody stools.  GU: No dysuria, change in color of urine, urgency or frequency.  No flank pain, no hematuria  Skin: +rash. No lesions, ulcerations MSK:  No joint pain or swelling.  No decreased range of motion.  No back pain. Neuro: No dizziness or lightheadedness.  Psych: No depression or anxiety. Mood stable.   Observations/Objective: Patient is well-developed, well-nourished in no acute distress. A&Ox3. Resting comfortably at home. Unlabored, regular breathing. Speech is clear and coherent with logical content. Unable to visualize rash on visit.   05/29/2021 right heart cath: mild pulmonary hypertension with elevated RV 54/12; PA pressure 55/18; mean 31 mmHg. PVR 2.6 WU. PW mean pressure is 17 mmHg. Mild to moderate MR.   04/08/2021 echocardiogram: EF 60-65%. Mild LVH. GIIDD. RV normal. PASP moderately elevated 54.8 mmHg. LA and RA both moderately dilated. Mild to mod MR. Moderate TR. AR trivial. Unable to exclude small PFO.   10/09/2021 HRCT: atherosclerosis. Enlarged pulmonic trunk. Some mild lower esophageal wall thickening, likely GERD. Peripheral and basilar predominant subpleural interlobular and intralobular septal thickening and ground-glass, similar vs minimally progressive when compared to previous. Indeterminate for UIP pattern; fibrotic NSIP favored. Stable 4 mm LUL nodule. Mild air trapping.   08/09/2020 CXR 2 view: pacemaker in place. Lungs are clear without any acute process.   Assessment and Plan: Pulmonary hypertension, unspecified ([place]) Mild on recent right heart cath. Started on lasix 20 mg daily; however, pt stopped taking due to bladder problems. Was instructed by PCP that this was ok. He is to follow up with Dr. Claiborne Billings in March. Advised that if he develops any new lower extremity swelling, orthopnea, PND, or  changes in his breathing, he needs to notify us or Dr. Claiborne Billings and likely will need to resume therapy.   ILD (interstitial lung disease) (HCC) Breathing stable. Tolerating Esbriet well with recent stable LFTs. Plans for repeat HRCT and PFTs in May.   Patient Instructions  -Continue Esbriet 3 tablets Three times a day with meals. Wear sunscreen when out in the sun -Stop potassium supplement if not taking lasix  Follow up with Dr. Claiborne Billings to discuss next steps for pulmonary hypertension  treatment since unable to tolerate lasix. Notify if you develop worsening SOB, leg swelling, orthopnea, or PND.   Notify your PCP about your rash after starting Jardiance. Seek emergency care if facial/tongue swelling develop or changes in your breathing.   PFTs scheduled for 5/9 with follow up with [name] after.   Someone will contact you from our office to schedule your HRCT before this.   Follow up as scheduled with [name]. If symptoms do not improve or worsen, please contact office for sooner follow up or seek emergency care.    Rheumatoid arthritis ([place]) Followed by rheumatology. On [place].   Drug reaction New rash after starting Jaridance. No evidence of anaphylaxis. Advised to follow up with PCP today and notify of this. If changes in breathing or facial/tongue swelling develop, seek emergency care.     I discussed the assessment and treatment plan with the patient. The patient was provided an opportunity to ask questions and all were answered. The patient agreed with the plan and demonstrated an understanding of the instructions.   The patient was advised to call back or seek an in-person evaluation if the symptoms worsen or if the condition fails to improve as anticipated.  I provided 25 minutes of non-face-to-face time during this encounter.   Clayton Bibles, NP

## 2021-07-11 NOTE — Assessment & Plan Note (Deleted)
Followed by rheumatology. On [place].

## 2021-07-11 NOTE — Assessment & Plan Note (Signed)
New rash after starting Jaridance. No evidence of anaphylaxis. Advised to follow up with PCP today and notify of this. If changes in breathing or facial/tongue swelling develop, seek emergency care.

## 2021-07-12 NOTE — Telephone Encounter (Signed)
Did not get a call back. Closing encounter.

## 2021-07-30 ENCOUNTER — Telehealth: Payer: Self-pay | Admitting: Internal Medicine

## 2021-07-30 NOTE — Telephone Encounter (Signed)
?  Given the fever 100.9 and also shaking -there is some kind of infection.  This could be a respiratory virus especially if the respiratory symptoms worsening.  Very hard to address from the office.  He should consider going to an urgent care.  Or he can wait to be seen by one of the nurse practitioners 07/31/2021. ? ?

## 2021-07-30 NOTE — Telephone Encounter (Signed)
Patient is calling back for update on below message.  ?He wanted to make [name] aware that he has now developed chills and his temp is 100.9. ? ?He is aware that we will contact him with recommendations.  ? ? ?

## 2021-07-30 NOTE — Telephone Encounter (Signed)
Primary Pulmonologist: Ramaswamy ?Last office visit and with whom: 07/11/2021 Marland Kitchen NP ?What do we see them for (pulmonary problems): Pulmonary HTN, ILD ?Last OV assessment/plan:  ?Assessment and Plan: ?Pulmonary hypertension, unspecified ([place]) ?Mild on recent right heart cath. Started on lasix 20 mg daily; however, pt stopped taking due to bladder problems. Was instructed by PCP that this was ok. He is to follow up with Dr. Claiborne Billings in March. Advised that if he develops any new lower extremity swelling, orthopnea, PND, or changes in his breathing, he needs to notify us or Dr. Claiborne Billings and likely will need to resume therapy.  ?  ?ILD (interstitial lung disease) ([place]) ?Breathing stable. Tolerating Esbriet well with recent stable LFTs. Plans for repeat HRCT and PFTs in May.  ?  ?Patient Instructions  ?-Continue Esbriet 3 tablets Three times a day with meals. Wear sunscreen when out in the sun ?-Stop potassium supplement if not taking lasix ?  ?Follow up with Dr. Claiborne Billings to discuss next steps for pulmonary hypertension treatment since unable to tolerate lasix. Notify if you develop worsening SOB, leg swelling, orthopnea, or PND.  ?  ?Notify your PCP about your rash after starting Jardiance. Seek emergency care if facial/tongue swelling develop or changes in your breathing.  ?  ?PFTs scheduled for 5/9 with follow up with [name] after.  ?  ?Someone will contact you from our office to schedule your HRCT before this.  ?  ?Follow up as scheduled with [name]. If symptoms do not improve or worsen, please contact office for sooner follow up or seek emergency care. ?  ?  ?  ?Rheumatoid arthritis ([place]) ?Followed by rheumatology. On [place].  ?  ?Drug reaction ?New rash after starting Jaridance. No evidence of anaphylaxis. Advised to follow up with PCP today and notify of this. If changes in breathing or facial/tongue swelling develop, seek emergency care.  ?  ?  ?I discussed the assessment and treatment plan with the  patient. The patient was provided an opportunity to ask questions and all were answered. The patient agreed with the plan and demonstrated an understanding of the instructions. ?  ?The patient was advised to call back or seek an in-person evaluation if the symptoms worsen or if the condition fails to improve as anticipated. ?  ?I provided 25 minutes of non-face-to-face time during this encounter. ?  ?  ?Clayton Bibles, NP  ? ? Assessment & Plan Note by Clayton Bibles, NP at 07/11/2021 10:30 AM ? ?Author: Clayton Bibles, NP Author Type: Nurse Practitioner Filed: 07/11/2021 10:32 AM  ?Note Status: Written Cosign: Cosign Not Required Encounter Date: 07/11/2021  ?Problem: Pulmonary hypertension, unspecified ([place])  ?Editor: Clayton Bibles, NP (Nurse Practitioner)      ?       ?Mild on recent right heart cath. Started on lasix 20 mg daily; however, pt stopped taking due to bladder problems. Was instructed by PCP that this was ok. He is to follow up with Dr. Claiborne Billings in March. Advised that if he develops any new lower extremity swelling, orthopnea, PND, or changes in his breathing, he needs to notify us or Dr. Claiborne Billings and likely will need to resume therapy.  ?  ?  ? ? ?Instructions ? ?-Continue Esbriet 3 tablets Three times a day with meals. Wear sunscreen when out in the sun ?-Stop potassium supplement if not taking lasix ?  ?Follow up with Dr. Claiborne Billings to discuss next steps for pulmonary hypertension treatment since unable to  tolerate lasix. Notify if you develop worsening SOB, leg swelling, orthopnea, or PND.  ?  ?Notify your PCP about your rash after starting Jardiance. Seek emergency care if facial/tongue swelling develop or changes in your breathing.  ?  ?PFTs scheduled for 5/9 with follow up with [name] after.  ?  ?Someone will contact you from our office to schedule your HRCT before this.  ?  ?Follow up as scheduled with [name]. If symptoms do not improve or worsen, please contact office for sooner  follow up or seek emergency care. ?   ?  ? ?  ? ?Was appointment offered to patient (explain)?  No, positive for Covid ? ? ?Reason for call: Tested positive for Covid on 07/29/21.  Went on a cruise, wife tested positive on Saturday.  Having since congestion and drainage on 3/3.  Denies any fever, chills or body aches.  He does have chest congestion and voice has been raspy for 2 days.  He has a mild sore throat.  Sob with exertion that is mild, does not have to stop.  Oxygen level is 97% and HR is 73 while sitting.  Does not wear any oxygen and does not use any inhalers.  He says he does not feel bad, he just sounds bad.  He is on Esbriet, pravastatin and Xarelto.  Advised him I would get a message to [name] and once we hear back from him we will give him a call back with his recommendations.  He verbalized understanding.   ? ?[name], please advise.  Thank you. ? ?(examples of things to ask: : When did symptoms start? Fever? Cough? Productive? Color to sputum? More sputum than usual? Wheezing? Have you needed increased oxygen? Are you taking your respiratory medications? What over the counter measures have you tried?) ? ?Allergies  ?Allergen Reactions  ? Ace Inhibitors Swelling  ?  Tongue swelling, angioedema  ? Lisinopril   ?  Other reaction(s): Lip swelling, O/E - lip swelling  ? Celecoxib Rash  ?  Skin rash ?  ? ? ?Immunization History  ?Administered Date(s) Administered  ? Fluad Quad(high Dose 65+) 02/11/2020  ? Influenza Inj Mdck Quad Pf 03/25/2021  ? Influenza, High Dose Seasonal PF 03/26/2017  ? Influenza-Unspecified 05/03/2015, 02/13/2016, 03/26/2016, 03/19/2017, 03/03/2018, 03/17/2019, 04/09/2020, 04/18/2021  ? PFIZER Comirnaty(Gray Top)Covid-19 Tri-Sucrose Vaccine 06/11/2019, 07/02/2019, 07/04/2019, 07/18/2019, 03/01/2020, 11/15/2020  ? PFIZER(Purple Top)SARS-COV-2 Vaccination 06/11/2019, 07/04/2019, 07/18/2019, 03/01/2020  ? Pneumococcal Conjugate-13 02/11/2020  ? Pneumococcal  Polysaccharide-23 12/10/2011, 01/30/2018  ? Pneumococcal-Unspecified 03/26/2016, 02/04/2018  ? Tdap 01/17/2016, 02/04/2018  ? Zoster Recombinat (Shingrix) 08/14/2020, 11/29/2020  ?  ?

## 2021-07-30 NOTE — Telephone Encounter (Signed)
Spoke with the pt and notified of response per MR  ?Pt verbalized understanding  ?OV with Dr Shearon Stalls tomorrow at 11:30 am ?Advised to seek emergent care sooner if needed ?

## 2021-07-31 ENCOUNTER — Telehealth: Payer: TRICARE For Life (TFL) | Admitting: Physician Assistant

## 2021-07-31 ENCOUNTER — Telehealth (INDEPENDENT_AMBULATORY_CARE_PROVIDER_SITE_OTHER): Payer: Medicare Other | Admitting: Internal Medicine

## 2021-07-31 ENCOUNTER — Encounter: Payer: Self-pay | Admitting: Internal Medicine

## 2021-07-31 ENCOUNTER — Telehealth: Payer: Self-pay | Admitting: Internal Medicine

## 2021-07-31 DIAGNOSIS — U071 COVID-19: Secondary | ICD-10-CM

## 2021-07-31 DIAGNOSIS — M069 Rheumatoid arthritis, unspecified: Secondary | ICD-10-CM | POA: Diagnosis not present

## 2021-07-31 DIAGNOSIS — J849 Interstitial pulmonary disease, unspecified: Secondary | ICD-10-CM | POA: Diagnosis not present

## 2021-07-31 MED ORDER — MOLNUPIRAVIR EUA 200MG CAPSULE: 4.0000 | ORAL_CAPSULE | Freq: Two times a day (BID) | ORAL | 0 refills | Status: AC

## 2021-07-31 NOTE — Telephone Encounter (Signed)
Spoke to patient.  ?He stated that he tested positive for covid on 07/29/2021. He is scheduled for acute visit today at 11:30. ?I have spoken to Dr. Shearon Stalls via epic secure message. she recommended mychart visit.  ?Appt changed to mychart visit.  ?Patient is aware and voiced his understanding.  ?Nothing further needed.  ? ?

## 2021-07-31 NOTE — Patient Instructions (Addendum)
Follow up with [name] as scheduled. ? ?Take the anti-viral medicine for the next 5 days ? ?

## 2021-07-31 NOTE — Progress Notes (Signed)
? ?      ?Cameron [name]    [id number]    03-Oct-1940 ? ?Primary Care Physician:Sparks, Leonie Douglas, MD ?Date of Appointment: 07/31/2021 ?Established Patient Visit ? ?I connected with Cameron [name] on 07/31/2021 by  video enabled telemedicine application and verified that I am speaking with the correct person using two identifiers. Patient is at home, Physician is in office.  ?  ?I discussed the limitations of evaluation and management by telemedicine. The patient expressed understanding and agreed to proceed. ? ? ?Chief complaint:   ?Chief Complaint  ?Patient presents with  ? Follow-up  ?  Cough 2 to 3 days nothing coming up  ? ? ? ?HPI: ?Cameron [name] is a 81 y.o. 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 ?He has rheumatoid arthritis and is on leuflonomide ? ?Interval Updates: ?He is covid positive and here for a sick visit. ?Symptoms started about 3 days ago. He is having cough, worsening shortness of breath, low grade fevers and shaking.  ? ?He has been vaccinated against covid x 3.  ?96 % and HR is 62 at home.  ? ?Appetite is ok.  ? ?Symptoms feel worse than his previous covid infection. He had taken anti-viral medication then as well.  ? ? ?I have reviewed the patient's family social and past medical history and updated as appropriate.  ? ?Past Medical History:  ?Diagnosis Date  ? Appendicitis   ? Atrial fibrillation ([place]) 12/12/2013  ? BPH (benign prostatic hypertrophy)   ? CAD (coronary artery disease) 12/2015  ? Cath by Dr [name] reveals distal and small vessel CAD.  Medical therapy advised.  ? Chest pain 12/03/2015  ? CHF (congestive heart failure) ([place])   ? 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 s/p PPM  ? Coronary artery disease   ? Coronary artery disease involving native coronary artery of native heart with unstable angina pectoris ([place]) 08/11/2017  ? History of blood transfusion 1968  ? "probably; related [place]  ? History of kidney stones   ? 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 right upper arm/excision  ? Hyperglycemia 11/05/2013  ? Hyperlipidemia 11/05/2013  ? Hypertension   ? Hypothyroidism   ? Hypothyroidism, unspecified 11/05/2013  ? 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 Onychomycosis 12/20/2015  ? OSA (obstructive sleep apnea) 10/26/2017  ?  AHI of 8.1/h overall and 6.2/h during REM sleep.  AHI was 20/h while supine.  Oxygen saturations dropped to 87%.  Now on CPAP at 7cm H2O  ? OSA on CPAP   ? Pacemaker-St.Jude 03/10/2012  ? 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 Rheumatoid arthritis ([place])   ? "hands" (08/11/2017)  ? Type II diabetes mellitus ([place])   ? ? ?Past Surgical History:  ?Procedure Laterality Date  ? BACK SURGERY    ? CARDIAC CATHETERIZATION N/A 12/28/2015  ? Procedure: Left Heart Cath and Coronary Angiography;  Surgeon: Belva Crome, MD;  Location: [facility];  Service: Cardiovascular;  Laterality: N/A;  ? CATARACT EXTRACTION W/ INTRAOCULAR LENS  IMPLANT, BILATERAL Bilateral   ? CORONARY ANGIOPLASTY WITH STENT PLACEMENT  08/11/2017  ? "2 stents"  ? CORONARY STENT INTERVENTION N/A 08/11/2017  ? Procedure: CORONARY STENT INTERVENTION;  Surgeon: Troy Sine, MD;  Location: [facility];  Service: Cardiovascular;  Laterality: N/A;  ? CYSTOSCOPY W/ STONE MANIPULATION    ? INGUINAL HERNIA REPAIR Left   ? INSERT /  REPLACE / REMOVE PACEMAKER  12/09/2011  ? SJM Accent DR RF implanted by DR Allred for complete heart block and syncope  ? JOINT REPLACEMENT    ? LAPAROSCOPIC CHOLECYSTECTOMY    ? LEAD REVISION/REPAIR N/A 08/09/2020  ? Procedure: LEAD REVISION/REPAIR;  Surgeon: [name], MD;  Location: [facility];  Service: Cardiovascular;  Laterality: N/A;  ? LITHOTRIPSY    ? LUMBAR DISC SURGERY    ? "removed arthritis and spurs"  ? PERMANENT PACEMAKER INSERTION N/A 12/09/2011  ? Procedure: PERMANENT PACEMAKER INSERTION;  Surgeon: [name], MD;  Location: [facility];  Service: Cardiovascular;  Laterality: N/A;  ? PPM  GENERATOR CHANGEOUT N/A 08/09/2020  ? Procedure: PPM GENERATOR CHANGEOUT;  Surgeon: [name], MD;  Location: [facility];  Service: Cardiovascular;  Laterality: N/A;  ? REPLACEMENT TOTAL KNEE Right   ? RIGHT HEART CATH N/A 05/29/2021  ? Procedure: RIGHT HEART CATH;  Surgeon: Troy Sine, MD;  Location: [facility];  Service: Cardiovascular;  Laterality: N/A;  ? RIGHT/LEFT HEART CATH AND CORONARY ANGIOGRAPHY N/A 08/11/2017  ? Procedure: RIGHT/LEFT HEART CATH AND CORONARY ANGIOGRAPHY;  Surgeon: Larey Dresser, MD;  Location: [facility];  Service: Cardiovascular;  Laterality: N/A;  ? TRANSURETHRAL RESECTION OF PROSTATE  2017/2018  ? ? ?Family History  ?Problem Relation Age of Onset  ? Pancreatic cancer Mother   ? Bone cancer Father   ? Alzheimer's disease Sister   ? Heart attack Paternal Uncle   ? Arthritis Maternal Grandfather   ? Alzheimer's disease Paternal Grandmother   ? Lung cancer Paternal Grandfather   ? ? ?Social History  ? ?Occupational History  ? Not on file  ?Tobacco Use  ? Smoking status: Former  ?  Packs/day: 1.00  ?  Years: 5.00  ?  Pack years: 5.00  ?  Types: Cigarettes  ?  Quit date: 07/10/1972  ?  Years since quitting: 49.0  ? Smokeless tobacco: Former  ?  Types: Chew  ?  Quit date: 30  ?Vaping Use  ? Vaping Use: Never used  ?Substance and Sexual Activity  ? Alcohol use: Yes  ?  Alcohol/week: 1.0 standard drink  ?  Types: 1 Glasses of wine per week  ? Drug use: No  ? Sexual activity: Not Currently  ? ? ? ?Physical Exam: ?There were no vitals taken for this visit. ?HR 67, O2 saturation 96% per patient home vitals ? ?Gen:      No acute distress ?Lungs:    No increased respiratory effort, able to speak in full sentences ? ?Data Reviewed: ?Imaging: ?I have personally reviewed the CT Chest in May 2021 which shows pulmonary fibrosis.  ? ?PFTs: ? ?PFT Results Latest Ref Rng & Units 04/17/2020 10/13/2019 07/21/2019 01/17/2019 11/03/2017 05/28/2017  ?FVC-Pre L 3.05 3.08 2.92 3.41 3.59  3.29  ?FVC-Predicted Pre % 86 86 81 94 98 90  ?FVC-Post L - - - - - 3.28  ?FVC-Predicted Post % - - - - - 89  ?Pre FEV1/FVC % % 81 81 82 82 83 87  ?Post FEV1/FCV % % - - - - - 83  ?FEV1-Pre L 2.46 2.49 2.40 2.78 2.98 2.85  ?FEV1-Predicted Pre % 98 98 94 107 114 109  ?FEV1-Post L - - - - - 2.72  ?DLCO uncorrected ml/min/mmHg 19.38 19.58 20.08 21.58 20.97 20.35  ?DLCO UNC% % 87 87 89 96 74 71  ?DLCO corrected ml/min/mmHg 19.38 19.58 20.08 - - -  ?  DLCO COR %Predicted % 87 87 89 - - -  ?DLVA Predicted % 105 101 112 104 89 92  ?TLC L - - - - - 5.47  ?TLC % Predicted % - - - - - 84  ?RV % Predicted % - - - - - 80  ? ?I have personally reviewed the patient's PFTs and normal pulmonary function.  ? ?Labs: ? ?Immunization status: ?Immunization History  ?Administered Date(s) Administered  ? Fluad Quad(high Dose 65+) 02/11/2020  ? Influenza Inj Mdck Quad Pf 03/25/2021  ? Influenza, High Dose Seasonal PF 03/26/2017  ? Influenza-Unspecified 05/03/2015, 02/13/2016, 03/26/2016, 03/19/2017, 03/03/2018, 03/17/2019, 04/09/2020, 04/18/2021  ? PFIZER Comirnaty(Gray Top)Covid-19 Tri-Sucrose Vaccine 06/11/2019, 07/02/2019, 07/04/2019, 07/18/2019, 03/01/2020, 11/15/2020  ? PFIZER(Purple Top)SARS-COV-2 Vaccination 06/11/2019, 07/04/2019, 07/18/2019, 03/01/2020  ? Pneumococcal Conjugate-13 02/11/2020  ? Pneumococcal Polysaccharide-23 12/10/2011, 01/30/2018  ? Pneumococcal-Unspecified 03/26/2016, 02/04/2018  ? Tdap 01/17/2016, 02/04/2018  ? Zoster Recombinat (Shingrix) 08/14/2020, 11/29/2020  ? ? ?External Records Personally Reviewed: pulmonary office visits.  ? ?Assessment:  ?Acute covid 19 infection at risk for cliical worsening ?Rheumatoid Arthritis on Leuflunomide ?ILD on esbriet ?Secondary pulmnary hypertension ? ?Plan/Recommendations: ?Will prescribe molnupiravir since paxlovid interacts with his xarelto. ? ?Continue esbriet, xarelto, leflunomide.  ? ?Supportive care with acetaminophen for fever reducer.  ? ?Return to Care: ?With Dr.  [name] as scheduled in May.  ? ? ?Lenice Llamas, MD ?Pulmonary and Critical Care Medicine ?[facility] ?[phone] ? ? ? ? ? ?

## 2021-07-31 NOTE — Progress Notes (Signed)
Based on the information that you have shared in the e-Visit Questionnaire, we recommend that you convert this visit to a video visit in order for the provider to better assess what is going on.  The provider will be able to give you a more accurate diagnosis and treatment plan if we can more freely discuss your symptoms and with the addition of a virtual examination.  ? ?You are high risk and would benefit from anti-viral treatments. However, we do require a video visit to prescribe these. You can schedule a video visit using the instructions below. ? ?If you convert to a video visit, we will bill your insurance (similar to an office visit) and you will not be charged for this e-Visit. You will be able to stay at home and speak with the first available [facility] advanced practice provider. The link to do a video visit is in the drop down Menu tab of your Welcome screen in [place]. Select "Virtual Urgent Care" video visit, not your Primary Care Provider. You may also schedule video visits by going to [email or web address]. Here you would select the same "Virtual Urgent Care". ? ?I provided 5 minutes of non face-to-face time during this encounter for chart review and documentation.  ? ? ? ?

## 2021-08-02 ENCOUNTER — Telehealth: Payer: Self-pay | Admitting: Internal Medicine

## 2021-08-02 NOTE — Telephone Encounter (Signed)
Called the pt and there was no answer and no option to leave msg, WCB.  ?

## 2021-08-05 NOTE — Telephone Encounter (Signed)
Called and spoke with patient's wife per DPR who states that they are feeling better and are able to eat and sleep better. She has a cough and he is still having some congestion. She states that overall they are doing better and that its just going to take time. Will route to [name] as Juluis Rainier. Nothing further needed at this time.  ?

## 2021-08-06 NOTE — Telephone Encounter (Signed)
Glad he is better. We are here for any assist at any time ?

## 2021-08-08 ENCOUNTER — Ambulatory Visit (INDEPENDENT_AMBULATORY_CARE_PROVIDER_SITE_OTHER): Payer: Medicare Other

## 2021-08-08 DIAGNOSIS — I442 Atrioventricular block, complete: Secondary | ICD-10-CM | POA: Diagnosis not present

## 2021-08-08 LAB — CUP PACEART REMOTE DEVICE CHECK
Battery Remaining Longevity: 100 mo
Battery Remaining Percentage: 89 %
Battery Voltage: 3.01 V
Brady Statistic AP VP Percent: 98 %
Brady Statistic AP VS Percent: 1 %
Brady Statistic AS VP Percent: 1.8 %
Brady Statistic AS VS Percent: 1 %
Brady Statistic RA Percent Paced: 97 %
Brady Statistic RV Percent Paced: 99 %
Date Time Interrogation Session: 20230316020014
Implantable Lead Implant Date: 20130716
Implantable Lead Implant Date: 20220317
Implantable Lead Location: 753859
Implantable Lead Location: 753860
Implantable Lead Model: 1948
Implantable Lead Model: 5076
Implantable Pulse Generator Implant Date: 20220317
Lead Channel Impedance Value: 410 Ohm
Lead Channel Impedance Value: 680 Ohm
Lead Channel Pacing Threshold Amplitude: 0.375 V
Lead Channel Pacing Threshold Amplitude: 0.5 V
Lead Channel Pacing Threshold Pulse Width: 0.5 ms
Lead Channel Pacing Threshold Pulse Width: 0.5 ms
Lead Channel Sensing Intrinsic Amplitude: 11.7 mV
Lead Channel Sensing Intrinsic Amplitude: 2.4 mV
Lead Channel Setting Pacing Amplitude: 0.625
Lead Channel Setting Pacing Amplitude: 2 V
Lead Channel Setting Pacing Pulse Width: 0.5 ms
Lead Channel Setting Sensing Sensitivity: 7 mV
Pulse Gen Model: 2272
Pulse Gen Serial Number: 3908614

## 2021-08-09 ENCOUNTER — Other Ambulatory Visit (HOSPITAL_COMMUNITY): Payer: TRICARE For Life (TFL)

## 2021-08-12 ENCOUNTER — Telehealth: Payer: Self-pay

## 2021-08-12 NOTE — Telephone Encounter (Signed)
Pt calls triage line and states that he is having an increase in urinary frequency and urgency. Questioned patient whether or not he is taking Trospium, he states he is not. Advised pt that urinary symptoms likely worsening as he has not been taking medication. Advised pt to resume Trospium today and call back if no improvement.  ?

## 2021-08-14 ENCOUNTER — Other Ambulatory Visit: Payer: Self-pay

## 2021-08-14 ENCOUNTER — Ambulatory Visit: Payer: Medicare Other | Admitting: Cardiovascular Disease

## 2021-08-16 NOTE — Progress Notes (Signed)
Remote pacemaker transmission.  ? ?

## 2021-09-24 ENCOUNTER — Ambulatory Visit
Admission: RE | Admit: 2021-09-24 | Discharge: 2021-09-24 | Disposition: A | Discharge status: Home / Self Care | Payer: Medicare Other | Source: Ambulatory Visit | Attending: Internal Medicine | Admitting: Internal Medicine

## 2021-09-24 ENCOUNTER — Other Ambulatory Visit: Payer: Self-pay

## 2021-09-24 DIAGNOSIS — M359 Systemic involvement of connective tissue, unspecified: Secondary | ICD-10-CM | POA: Insufficient documentation

## 2021-09-24 DIAGNOSIS — J8489 Other specified interstitial pulmonary diseases: Secondary | ICD-10-CM | POA: Insufficient documentation

## 2021-09-26 DIAGNOSIS — N1832 Chronic kidney disease, stage 3b: Secondary | ICD-10-CM | POA: Insufficient documentation

## 2021-09-27 ENCOUNTER — Other Ambulatory Visit: Payer: Self-pay | Admitting: Internal Medicine

## 2021-09-27 DIAGNOSIS — R1310 Dysphagia, unspecified: Secondary | ICD-10-CM

## 2021-09-27 DIAGNOSIS — N1832 Chronic kidney disease, stage 3b: Secondary | ICD-10-CM

## 2021-10-01 ENCOUNTER — Encounter: Payer: Self-pay | Admitting: Internal Medicine

## 2021-10-01 ENCOUNTER — Telehealth: Payer: Self-pay | Admitting: Internal Medicine

## 2021-10-01 ENCOUNTER — Ambulatory Visit (INDEPENDENT_AMBULATORY_CARE_PROVIDER_SITE_OTHER): Payer: Medicare Other | Admitting: Internal Medicine

## 2021-10-01 ENCOUNTER — Encounter: Payer: Medicare Other | Admitting: *Deleted

## 2021-10-01 VITALS — BP 108/64 | HR 67 | Temp 97.5°F | Ht 67.0 in | Wt 186.2 lb

## 2021-10-01 DIAGNOSIS — M0600 Rheumatoid arthritis without rheumatoid factor, unspecified site: Secondary | ICD-10-CM

## 2021-10-01 DIAGNOSIS — M359 Systemic involvement of connective tissue, unspecified: Secondary | ICD-10-CM

## 2021-10-01 DIAGNOSIS — T50905A Adverse effect of unspecified drugs, medicaments and biological substances, initial encounter: Secondary | ICD-10-CM | POA: Diagnosis not present

## 2021-10-01 DIAGNOSIS — J849 Interstitial pulmonary disease, unspecified: Secondary | ICD-10-CM

## 2021-10-01 DIAGNOSIS — J8489 Other specified interstitial pulmonary diseases: Secondary | ICD-10-CM

## 2021-10-01 DIAGNOSIS — Z8739 Personal history of other diseases of the musculoskeletal system and connective tissue: Secondary | ICD-10-CM

## 2021-10-01 DIAGNOSIS — Z77098 Contact with and (suspected) exposure to other hazardous, chiefly nonmedicinal, chemicals: Secondary | ICD-10-CM

## 2021-10-01 DIAGNOSIS — Z006 Encounter for examination for normal comparison and control in clinical research program: Secondary | ICD-10-CM

## 2021-10-01 DIAGNOSIS — U071 COVID-19: Secondary | ICD-10-CM | POA: Diagnosis not present

## 2021-10-01 LAB — BASIC METABOLIC PANEL
BUN: 44 mg/dL — ABNORMAL HIGH (ref 6–23)
CO2: 30 mEq/L (ref 19–32)
Calcium: 10.5 mg/dL (ref 8.4–10.5)
Chloride: 101 mEq/L (ref 96–112)
Creatinine, Ser: 1.82 mg/dL — ABNORMAL HIGH (ref 0.40–1.50)
GFR: 34.41 mL/min — ABNORMAL LOW (ref >60.00)
Glucose, Bld: 98 mg/dL (ref 70–99)
Potassium: 5.5 mEq/L — ABNORMAL HIGH (ref 3.5–5.1)
Sodium: 137 mEq/L (ref 135–145)

## 2021-10-01 LAB — HEPATIC FUNCTION PANEL
ALT: 17 U/L (ref 0–53)
AST: 16 U/L (ref 0–37)
Albumin: 4.4 g/dL (ref 3.5–5.2)
Alkaline Phosphatase: 66 U/L (ref 39–117)
Bilirubin, Direct: 0.1 mg/dL (ref 0.0–0.3)
Total Bilirubin: 0.3 mg/dL (ref 0.2–1.2)
Total Protein: 7.8 g/dL (ref 6.0–8.3)

## 2021-10-01 NOTE — Research (Signed)
Title: Chronic Fibrosing Interstitial Lung Disease with Progressive Phenotype Prospective Outcomes (ILD-PRO) Registry    Protocol #: IPF-PRO-SUB, Clinical Trials # [id number], Sponsor: [employer]   Protocol Version Amendment 4 dated 12Sep2019  and confirmed current on  Consent Version for today's visit date of  Is Cameron [name] IRB Approved Version 29 Apr 2018 Revised 29 Apr 2018   Objectives:  Describe current approaches to diagnosis and treatment of chronic fibrosing ILDs with progressive phenotype  Describe the natural history of chronic fibrosing ILDs with progressive phenotype  Assess quality of life from self-administered participant reported questionnaires for each disease group  Describe participant interactions with the healthcare system, describe treatment practices across multiple institutions for each disease group  Collect biological samples linked to well characterized chronic fibrosing ILDs with progressive phenotype to identify disease biomarkers  Collect data and biological samples that will support future research studies.                                            Key Inclusion Criteria: Willing and able to provide informed consent  Age ? 30 years  Diagnosis of a non-IPF ILD of any duration, including, but not limited to Idiopathic Non-Specific Interstitial Pneumonia (INSIP), Unclassifiable Idiopathic Interstitial Pneumonias (IIPs), Interstitial Pneumonia with Autoimmune Features (IPAF), Autoimmune ILDs such as Rheumatoid Arthritis (RA-ILD) and Systemic Sclerosis (SSC-ILD), Chronic Hypersensitivity Pneumonitis (HP), Sarcoidosis or Exposure-related ILDs such as asbestosis.  Chronic fibrosing ILD defined by reticular abnormality with traction bronchiectasis with or without honeycombing confirmed by chest HRCT scan and/or lung biopsy.  Progressive phenotype as defined by fulfilling at least one of the criteria below of fibrotic changes (progression set point)  within the last 24 months regardless of treatment considered appropriate in individual ILDs:  decline in FVC % predicted (% pred) based on >10% relative decline  decline in FVC % pred based on ? 5 - <10% relative decline in FVC combined with worsening of respiratory symptoms as assessed by the site investigator  decline in FVC % pred based on ? 5 - <10% relative decline in FVC combined with increasing extent of fibrotic changes on chest imaging (HRCT scan) as assessed by the site investigator  decline in DLCO % pred based on ? 10% relative decline  worsening of respiratory symptoms as well as increasing extent of fibrotic changes on chest imaging (HRCT scan) as assessed by the site investigator independent of FVC change.     Key Exclusion Criteria: Malignancy, treated or untreated, other than skin or early stage prostate cancer, within the past 5 years  Currently listed for lung transplantation at the time of enrollment  Currently enrolled in a clinical trial at the time of enrollment in this registry       Clinical Research Coordinator / Research RN note : This visit for Cameron [name] Subject [id number] with DOB: 03/02/1941 on 10/01/2021 for the above protocol is Visit/Encounter 4 and is for purpose of research.    Subject expressed continued interest and consent in continuing as a study subject. Subject confirmed that there was no change in contact information (e.g. address, telephone, email). Subject thanked for participation in research and contribution to science.      During this visit on 10/01/2021 , the subject completed the blood work and questionnaires per the above referenced protocol. Please refer to the subject's paper source binder for further details.  Signed by [place] Assistant [facility]  [place], [place] 02:10 PM 10/01/2021

## 2021-10-01 NOTE — Patient Instructions (Addendum)
Interstitial lung disease due to connective tissue disease ([place]) ?History of rheumatoid arthritis and immunosuppressed on leflunomide ?Hx of Agent Orange Exposure ? ? ?- Pulmonary Fibrosis based on PFT and walk test is every so slowly and steadiy progressive  sinc 2019 and 2020 but stable since  visit May 2021 and starting esbriet in June 2021 through May 2023 ? ?- Hx of covid March 2023 - did not affect your fibrosis ? ?- Fatigue can be post covid or due to esbriet in setting of reduced kidney function ? ?- Diarrhea could be due to esbriet in setting of reduced kidney function ? ?Plan ?- check LFT 10/01/2021 ? - -Continue Esbriet  ?- [eat with food, space adequately between meals and apply sunscreen at all times ?- reduce dose from 3 pills three times daily  to 2 pills three times daily  ?-Continue daily exercise as before ?- meet MArilyn for ILD-PRO registry visit 10/01/2021 ? ? ? ?Shortness of breath ?Grade 2 diastolic dysfunction ?Pulmonary venous hypertension ? ?- lasix trial in Jan 2023 did not help symptoms ? ?Plan ? - per Dr Claiborne Billings ? ?Hyperkalemia 09/30/21 - 6.12mq ? ?- at PCP office. Off KCL tablets x 1 week ? ?Plan ? - check BMET 10/01/2021 STAT  ? ? ?Followup ?- call [facility] 4 weeks to let u[place]ore[place]now if diarrhea and fatigue are better with lower dose of esbriet ?- 6 mnths do spirometry and dlco ? - retunr in 6 months - see Dr [name]- 30 min visit but after spirometry ? ?

## 2021-10-01 NOTE — Patient Instructions (Signed)
Performed Spirometry and DLCO Today.  ?

## 2021-10-01 NOTE — Progress Notes (Signed)
Performed Spirometry and DLCO Today.  ?

## 2021-10-01 NOTE — Telephone Encounter (Signed)
? ?His potassium is 5.5 and improved compared to 6.1 mg percent yesterday  ? ?However but his creatinine is 1.8 mg percent and getting worse.  Yesterday with PCP creatinine was 1.6 mg percent.  And 9 days ago it was 1.5 mg percent and in February it was 1.07 mg percent. ? ? ?I am wondering if the diarrhea i which I suspect might be from the Esbriet is making him dehydrated ? ?Plan ?- He needs to talk to his PCP because his kidney functions getting worse fast ?- Hydrate adequately ?-Stop Esbriet for the next 2 weeks -if there is the cause of the diarrhea then stopping this can control diarrhea and help  potential dehydration ?-Check chemistry panel with us in 2 weeks before we make any decisions on restarting Esbriet ? ? ?LABS  ? ? ?PULMONARY ?No results for input(s): PHART, PCO2ART, PO2ART, HCO3, TCO2, O2SAT in the last 168 hours. ? ?Invalid input(s): PCO2, PO2 ? ?CBC ?No results for input(s): HGB, HCT, WBC, PLT in the last 168 hours. ? ?COAGULATION ?No results for input(s): INR in the last 168 hours. ? ?CARDIAC ? No results for input(s): TROPONINI in the last 168 hours. ?No results for input(s): PROBNP in the last 168 hours. ? ?Creatinine  ?Glomerular Filtration Rate (eGFR), MDRD Estimate  ?Calcium  ?AST   ?ALT   ?Alk Phos (alkaline Phosphatase)  ?Albumin  ?Bilirubin, Total  ?Protein, Total  ?A/G Ratio  ?Phosphorus  ?Component 09/30/2021 09/26/2021 09/23/2021 09/23/2021 09/19/2021 07/08/2021 06/18/2021 05/30/2021 05/30/2021 05/30/2021 05/30/2021 02/05/2021 11/19/2020 11/19/2020 11/19/2020 11/19/2020 08/30/2020 08/01/2020 05/03/2020 02/07/2020 02/07/2020 02/07/2020 02/07/2020 11/04/2019 08/09/2019 08/09/2019 08/09/2019 08/09/2019 08/02/2019 06/21/2019 04/27/2019 02/08/2019 02/08/2019 02/08/2019 02/08/2019 10/19/2018   ?                                       ?Glucose 148 High   ? 220 High   ? -- 150 High   ? 139 High   ? -- 142 High   ? -- -- -- -- 150 High   ? -- -- -- -- 139 High   ? 131 High   ? 106 -- -- -- --  171 High   ? -- -- -- -- 152 High   ? 201 High   ? 149 High   ? -- -- -- -- 93 Load older lab results  ?Sodium 137 141 -- 135 Low   ? 139 -- 140 -- -- -- -- 140 -- -- -- -- 139 140 141 -- -- -- -- 140 -- -- -- -- 141 139 139 -- -- -- -- 140 Load older lab results  ?Potassium 6.1 High Panic   ? 5.8 High   ? -- 6.1 High Panic    ? 6.4 High Panic   ? -- 4.7 -- -- -- -- 4.9 -- -- -- -- 4.5 5.5 High   ? 4.8 -- -- -- -- 4.9 -- -- -- -- 5.1 4.7 4.8 -- -- -- -- 4.6 Load older lab results  ?Chloride 101 104 -- 101 105 -- 106 -- -- -- -- 105 -- -- -- -- 106 106 106 -- -- -- -- 105 -- -- -- -- 106 103 104 -- -- -- -- 107 Load older lab results  ?Carbon Dioxide (CO2) 30.9 32.1 High   ? -- 28.3 31.2 -- 27.8 -- -- -- -- 27.3 -- -- -- -- 29.1 28.1 33.0 High   ? -- -- -- --   29.3 -- -- -- -- 30.1 27.1 32.3 High   ? -- -- -- -- 26.7 Load older lab results  ?Urea Nitrogen (BUN) 31 High   ? 31 High   ? -- 30 High   ? 28 High   ? -- 26 High   ? -- -- -- -- 33 High   ? -- -- -- -- 26 High   ? 30 High   ? 21 -- -- -- -- 26 High   ? -- -- -- -- 29 High   ? 30 High   ? 25 -- -- -- -- 28 High   ? Load older lab results  ?Creatinine 1.6 High   ? 1.5 High   ? -- 1.5 High   ?                                   ? ? ?CHEMISTRY ?Recent Labs  ?Lab 10/01/21 ?1151  ?NA 137  ?K 5.5*  ?CL 101  ?CO2 30  ?GLUCOSE 98  ?BUN 44*  ?CREATININE 1.82*  ?CALCIUM 10.5  ? ?Estimated Creatinine Clearance: 33.1 mL/min (A) (by C-G formula based on SCr of 1.82 mg/dL (H)). ? ? ?LIVER ?Recent Labs  ?Lab 10/01/21 ?1151  ?AST 16  ?ALT 17  ?ALKPHOS 66  ?BILITOT 0.3  ?PROT 7.8  ?ALBUMIN 4.4  ? ? ? ?INFECTIOUS ?No results for input(s): LATICACIDVEN, PROCALCITON in the last 168 hours. ? ? ?ENDOCRINE ?CBG (last 3)  ?No results for input(s): GLUCAP in the last 72 hours. ? ? ? ? ? ? ?IMAGING x48h  - image(s) personally visualized  -   highlighted in bold ?No results found. ? ?

## 2021-10-01 NOTE — Progress Notes (Signed)
? ? ?PCP [name], MD ? ?HPI ? ? ?IOV 07/10/2017 ? ?Chief Complaint  ?Patient presents with  ? Advice Only  ?  Referred by [facility] due to SOB.  PFT done 05/28/17.  Pt has been having issues with SOB x4 months especially with exertion and has some mild chest tightness. Denies any cough.  ? ? ?81 year old male referred by [name] for evaluation of shortness of breath after cardiac etiologies ruled out.  He tells me that he is a remote smoker.  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  He has a long-standing history of rheumatoid arthritis followed by Dr. Jefm Bryant in [place].  He is to be on methotrexate for many years and stopped taking it because of cardiac dysfunction [he personally is convinced that methotrexate because this].  He was then on leflunomide as of 2017 but is currently not on it.  His last rheumatoid factor and CCP antibodies were negative on my personal chart review of the outside records in 2016.   ? ?Now for the last 3 or 6 months he is got insidious onset of shortness of breath that is slowly progressive.  His dyspnea on exertion relieved by rest.  Class II-3 activities.  There I ? ?s no associated cough or orthopnea proximal nocturnal dyspnea.  He did have some edema but this got cleared up but the dyspnea is continuing to get worse.  In the last few months he has had a cardiac echo that showed pulmonary hypertension.  Did have cardiac stress test that is normal.  Had pulmonary function test that showed isolated reduction in diffusion capacity and therefore he has been referred here. ? ?Walking desaturation test on 07/10/2017 185 feet x 3 laps on ROOM AIR:  did NOT desaturate. Rest pulse ox was 100%, final pulse ox was 98%. HR response 60/min at rest to 121/min at peak exertion. Patient Cameron [name]  Did not Desaturate < 88% . Cameron [name] did not  Desaturated </=  3% points. Gerda Diss Obst yes did get tachyardic ? ? ?OV 08/04/2017 ? ?Chief Complaint  ?Patient presents with  ? Follow-up  ?  ILD  ? ? ?Follow-up interstitial lung disease workup ? ?After the last visit no interim problems.  He has some chronic pedal edema that he will talk to about with his primary care physician.  He did see Dr. Jefm Bryant rheumatologist in July 15, 2017.  I reviewed his notes.  It is not specifically indicate patient has nonspecific seronegative arthritis with a differential diagnosis of seronegative rheumatoid arthritis versus psoriatic.  Patient still seems to think the root of all his problems is the methotrexate he took for over 10 years.  At this point in time there is no decompensation.  As part of the ILD workup his pulmonary function test shows mild reduction in diffusion capacity.  Correlating with this is evidence of pulmonary hypertension on the echo and high-resolution CT chest enlarged pulmonary arteries.  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  His autoimmune panel is negative.  And so the vasculitis panel and hypersensitivity pneumonitis panel. ? ? ? ?IMPRESSION: ?1. 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 ?Early/mild usual interstitial pneumonitis is not excluded. ?2. Aortic atherosclerosis (ICD10-170.0). Coronary artery ?calcification. ?3. 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 ?  PA peak pressure: 55 mm Hg (S). ?4. Left renal stone, partially imaged. ?  ?  ?Electronically Signed ?  By: Lorin Picket M.D. ?  On: 07/22/2017 15:03 ? ?OV 11/03/2017 ? ?Chief Complaint  ?Patient presents with  ? Follow-up  ?  Pt has SOB with  exertion, some dry cough.   ? ? ?Follow-up suspected interstitial lung disease in the setting of rheumatoid arthritis and long methotrexate intake ? ?He presents with his wife.  At the time of last visit I was not fully convinced that he had interstitial lung disease.  His CT scan indicated presence of pulmonary hypertension and so did his echocardiogram.  Therefore I referred him back to [name] cardiology.  In the spring 2019 he did have a right heart catheterization and left heart catheterization that showed mild pulmonary hypertension but also coronary artery blockage.  He status post 2 stents.  After that his shortness of breath improved but he tells me overall his fatigue level has not improved.  In talking to him I find out that he exercises 5 times a week doing heart track 2 times a week and the other 3 days walking a mile each time and doing weight lifting.  It appears that he takes a 20-minute nap after these exercises and then feels reenergized.  His mother feels that he does not have the effort tolerance as his younger days but he denies having any symptoms of chest pain or shortness of breath or cough when he does these heavy exertion the weight lifting or walking a mile out doing heart track.  In fact in the walking desaturation test today he walked extremely fast and had no problems. ? ?In terms of his possible interstitial lung disease he had pulmonary function test today and felt to show some improvement and on exam he does not have any crackles.  Also his wife tells me that for the last 2 months he is using CPAP for sleep apnea and this is also helping him. ? ?Right Heart Pressures [facility] Procedural Findings: ?Hemodynamics (mmHg) ?RA mean 2 ?RV 42/6 ?PA 42/8, mean 21 ?PCWP mean 7 ?LV 134/8 ?AO 147/52  ? ? ? ? ? ?OV 06/01/2019 ? ?Subjective:  ?[name], male , DOB: 1940/10/29 , age 81 y.o. , MRN: [id number] , ADDRESS: [phone] ?[place] [address] ? ? ?06/01/2019 -   ?Chief  Complaint  ?Patient presents with  ? Follow-up  ?  Pt states he has been doing okay since last visit but states he has been having a little more SOB x4 weeks now. Pt also has occ coughing with yellow phlegm.  ? 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 ?HPI ?Cameron [name] 81 y.o. -returns for follow-up.  I personally have not seen him since the summer 2019.  He says overall he has been stable.  In August 2020 he had a CT scan of the chest that confirmed the presence of interstitial lung disease in the setting of his rheumatoid arthritis.  He was stable.  His pulmonary function test was stable.  He says now in the last 2 months has had a decline in shortness of breath.  There is  also some cough with sputum production but that has resolved.  It is definitely present with exertion but relieved by rest.  His walking desaturation test compared to 18 months ago is roughly the same except that he is very tachycardic.  He does have a pacemaker.  He has an appoint with [name] his electrophysiologist today.  His symptom scores are listed below. ? ?SYMPTOM SCALE - ILD 06/01/2019 ?  ?O2 use no  ?Shortness of Breath 0 -> 5 scale with 5 being worst (score 6 If unable to do)  ?At rest 0  ?Simple tasks - showers, clothes change, eating, shaving 0  ?Household (dishes, doing bed, laundry) 0  ?Shopping 0  ?Walking level at own pace 2  ?Walking keeping up with others of same age 48  ?Walking up Stairs 3  ?Walking up [place] 3  ?Total (40 - 48) Dyspnea Score 10  ?How bad is your cough? 1  ?How bad is your fatigue 2  ? ? ? ? ? ?ROS ?- per HPI ? ? ? ? ?OV 08/01/2019 - telephine visit - identified with 2 person identifier. Telephone visit - limits, risks benefits explained ? ?Subjective:  ?[name], male , DOB: 07/10/1940 , age 45 y.o. , MRN: [id number] , ADDRESS: [phone] ?[place] [address] ? ? ?08/01/2019 -  Follow-up  interstitial lung disease in the  setting of rheumatoid arthritis and long methotrexate intake ? ? ?HPI ?Cameron [name] 81 y.o. - similar dyspnea compared to JAn 2021.  No better nor worse.  After last visit he has seen cardiology x2.  It appea

## 2021-10-02 NOTE — Telephone Encounter (Signed)
Called and spoke with patient to let him know of message from [name]. Patient expressed understanding. Labs have been ordered for 2 weeks from now. Patient told to stop Esbriet and follow up with PCP about Kidney function. Labs have been forwarded. Nothing further needed at this time. ?

## 2021-10-08 ENCOUNTER — Other Ambulatory Visit (HOSPITAL_BASED_OUTPATIENT_CLINIC_OR_DEPARTMENT_OTHER): Payer: Self-pay | Admitting: General Practice

## 2021-10-09 ENCOUNTER — Ambulatory Visit
Admission: RE | Admit: 2021-10-09 | Discharge: 2021-10-09 | Disposition: A | Discharge status: Home / Self Care | Payer: Medicare Other | Source: Ambulatory Visit | Attending: Internal Medicine | Admitting: Internal Medicine

## 2021-10-09 DIAGNOSIS — R1310 Dysphagia, unspecified: Secondary | ICD-10-CM | POA: Insufficient documentation

## 2021-10-09 DIAGNOSIS — N1832 Chronic kidney disease, stage 3b: Secondary | ICD-10-CM

## 2021-10-16 ENCOUNTER — Other Ambulatory Visit (INDEPENDENT_AMBULATORY_CARE_PROVIDER_SITE_OTHER): Payer: Medicare Other

## 2021-10-16 DIAGNOSIS — J849 Interstitial pulmonary disease, unspecified: Secondary | ICD-10-CM

## 2021-10-16 LAB — BASIC METABOLIC PANEL
BUN: 49 mg/dL — ABNORMAL HIGH (ref 6–23)
CO2: 26 mEq/L (ref 19–32)
Calcium: 10 mg/dL (ref 8.4–10.5)
Chloride: 102 mEq/L (ref 96–112)
Creatinine, Ser: 1.84 mg/dL — ABNORMAL HIGH (ref 0.40–1.50)
GFR: 33.95 mL/min — ABNORMAL LOW (ref >60.00)
Glucose, Bld: 159 mg/dL — ABNORMAL HIGH (ref 70–99)
Potassium: 4.8 mEq/L (ref 3.5–5.1)
Sodium: 136 mEq/L (ref 135–145)

## 2021-10-16 LAB — HEPATIC FUNCTION PANEL
ALT: 18 U/L (ref 0–53)
AST: 16 U/L (ref 0–37)
Albumin: 4.2 g/dL (ref 3.5–5.2)
Alkaline Phosphatase: 68 U/L (ref 39–117)
Bilirubin, Direct: 0.2 mg/dL (ref 0.0–0.3)
Total Bilirubin: 0.8 mg/dL (ref 0.2–1.2)
Total Protein: 7.4 g/dL (ref 6.0–8.3)

## 2021-10-22 LAB — PULMONARY FUNCTION TEST
DL/VA % pred: 92 %
DL/VA: 3.62 ml/min/mmHg/L
DLCO cor % pred: 84 %
DLCO cor: 18.63 ml/min/mmHg
DLCO unc % pred: 85 %
DLCO unc: 18.84 ml/min/mmHg
FEF 25-75 Pre: 3.22 L/sec
FEF2575-%Pred-Pre: 194 %
FEV1-%Pred-Pre: 112 %
FEV1-Pre: 2.75 L
FEV1FVC-%Pred-Pre: 118 %
FEV6-%Pred-Pre: 101 %
FEV6-Pre: 3.27 L
FEV6FVC-%Pred-Pre: 108 %
FVC-%Pred-Pre: 93 %
FVC-Pre: 3.27 L
Pre FEV1/FVC ratio: 84 %
Pre FEV6/FVC Ratio: 100 %

## 2021-11-01 ENCOUNTER — Telehealth: Payer: Self-pay | Admitting: Internal Medicine

## 2021-11-01 DIAGNOSIS — J8489 Other specified interstitial pulmonary diseases: Secondary | ICD-10-CM

## 2021-11-01 NOTE — Telephone Encounter (Signed)
Patient was holding Esbriet per note on 10/01/21 due to worseining kidney function. Routing to [name] prior to sending rx to [facility]     Latest Ref Rng & Units 10/16/2021   10:32 AM 10/01/2021   11:51 AM 07/01/2021   10:57 AM  CMP  Glucose 70 - 99 mg/dL 159  98  161   BUN 6 - 23 mg/dL 49  44  27   Creatinine 0.40 - 1.50 mg/dL 1.84  1.82  1.07   Sodium 135 - 145 mEq/L 136  137  135   Potassium 3.5 - 5.1 mEq/L 4.8  5.5  4.2   Chloride 96 - 112 mEq/L 102  101  103   CO2 19 - 32 mEq/L _0 Calcium 8.4 - 10.5 mg/dL 10.0  10.5  9.5   Total Protein 6.0 - 8.3 g/dL 7.4  7.8    Total Bilirubin 0.2 - 1.2 mg/dL 0.8  0.3    Alkaline Phos 39 - 117 U/L 68  66    AST 0 - 37 U/L 16  16    ALT 0 - 53 U/L 18  17       Virdie Penning, PharmD, MPH, BCPS, CPP Clinical Pharmacist (Rheumatology and Pulmonology)

## 2021-11-01 NOTE — Telephone Encounter (Signed)
   He has seen nephrologist and been diagnosed with stage IIIb chronic kidney disease.  GFR is 34.  Appears that pirfenidone did not cause the kidney disease.  Nevertheless GFR is kind of borderline low  Plan -Can restart pirfenidone but just to submaximal dosing of 2 little pills 3 times daily OR 839m twice daily and monitor for side effects.  - but need to closely monitor -   - see app inf ew weeks of starting   - also stop fish oil  - renal dose gabapentin   Estimated Creatinine Clearance: 32.7 mL/min (A) (by C-G formula based on SCr of 1.84 mg/dL (H)).

## 2021-11-07 ENCOUNTER — Ambulatory Visit (INDEPENDENT_AMBULATORY_CARE_PROVIDER_SITE_OTHER): Payer: Medicare Other

## 2021-11-07 DIAGNOSIS — I442 Atrioventricular block, complete: Secondary | ICD-10-CM

## 2021-11-07 LAB — CUP PACEART REMOTE DEVICE CHECK
Battery Remaining Longevity: 97 mo
Battery Remaining Percentage: 86 %
Battery Voltage: 3.01 V
Brady Statistic AP VP Percent: 99 %
Brady Statistic AP VS Percent: 1 %
Brady Statistic AS VP Percent: 1.3 %
Brady Statistic AS VS Percent: 1 %
Brady Statistic RA Percent Paced: 98 %
Brady Statistic RV Percent Paced: 99 %
Date Time Interrogation Session: 20230615020014
Implantable Lead Implant Date: 20130716
Implantable Lead Implant Date: 20220317
Implantable Lead Location: 753859
Implantable Lead Location: 753860
Implantable Lead Model: 1948
Implantable Lead Model: 5076
Implantable Pulse Generator Implant Date: 20220317
Lead Channel Impedance Value: 440 Ohm
Lead Channel Impedance Value: 640 Ohm
Lead Channel Pacing Threshold Amplitude: 0.375 V
Lead Channel Pacing Threshold Amplitude: 0.5 V
Lead Channel Pacing Threshold Pulse Width: 0.5 ms
Lead Channel Pacing Threshold Pulse Width: 0.5 ms
Lead Channel Sensing Intrinsic Amplitude: 11.7 mV
Lead Channel Sensing Intrinsic Amplitude: 2.2 mV
Lead Channel Setting Pacing Amplitude: 0.625
Lead Channel Setting Pacing Amplitude: 2 V
Lead Channel Setting Pacing Pulse Width: 0.5 ms
Lead Channel Setting Sensing Sensitivity: 7 mV
Pulse Gen Model: 2272
Pulse Gen Serial Number: 3908614

## 2021-11-08 MED ORDER — PIRFENIDONE 267 MG PO TABS: 534.0000 mg | ORAL_TABLET | Freq: Three times a day (TID) | ORAL | 5 refills | Status: DC

## 2021-11-08 NOTE — Telephone Encounter (Signed)
Spoke with patient's wife, Cameron [name]. She states that he restarted Esbriet 836m (3 tabs) three times daily about 2 weeks ago. Has tolerated without issue. He has been advised to reduce dose to 2 tabs (5337m three times daily. Reviewed that medication is not studied in CKD but lower dose is safer at this point. Refill sent to [facility] for lower dose of pirfenidone  His wife states that he has restarted fish oil. He only takes 1 capsule once daily. She states he restarted due to joint pain that is ameliorated by fish oil  She states he is only taking gabapentin 30034mnce daily. Appropriate based on renal function.  CMP on 11/05/21 showed stable LFTs. Creatinine at 1.5.  Routing to scheduling team for pt to be seen by APP for f/u appt  DevKnox SalivaharmD, MPH, BCPS, CPP Clinical Pharmacist (Rheumatology and Pulmonology)

## 2021-11-11 NOTE — Telephone Encounter (Signed)
Patient is scheduled with [name] on 11/27/2021 at 3pm.

## 2021-11-11 NOTE — Telephone Encounter (Signed)
Left voicemail for patient to call back to schedule office visit to discuss [place].

## 2021-11-20 ENCOUNTER — Ambulatory Visit: Payer: Medicare Other | Admitting: Cardiovascular Disease

## 2021-11-27 ENCOUNTER — Ambulatory Visit (INDEPENDENT_AMBULATORY_CARE_PROVIDER_SITE_OTHER): Payer: Medicare Other | Admitting: Internal Medicine

## 2021-11-27 ENCOUNTER — Encounter: Payer: Self-pay | Admitting: Internal Medicine

## 2021-11-27 VITALS — BP 118/60 | HR 62 | Temp 98.6°F | Ht 67.0 in | Wt 183.8 lb

## 2021-11-27 DIAGNOSIS — J8489 Other specified interstitial pulmonary diseases: Secondary | ICD-10-CM

## 2021-11-27 DIAGNOSIS — M0600 Rheumatoid arthritis without rheumatoid factor, unspecified site: Secondary | ICD-10-CM

## 2021-11-27 DIAGNOSIS — M359 Systemic involvement of connective tissue, unspecified: Secondary | ICD-10-CM

## 2021-11-27 DIAGNOSIS — Z79899 Other long term (current) drug therapy: Secondary | ICD-10-CM

## 2021-11-27 LAB — BASIC METABOLIC PANEL
BUN: 19 mg/dL (ref 6–23)
CO2: 29 mEq/L (ref 19–32)
Calcium: 9.3 mg/dL (ref 8.4–10.5)
Chloride: 105 mEq/L (ref 96–112)
Creatinine, Ser: 1.31 mg/dL (ref 0.40–1.50)
GFR: 50.99 mL/min — ABNORMAL LOW (ref >60.00)
Glucose, Bld: 141 mg/dL — ABNORMAL HIGH (ref 70–99)
Potassium: 3.7 mEq/L (ref 3.5–5.1)
Sodium: 141 mEq/L (ref 135–145)

## 2021-11-27 LAB — HEPATIC FUNCTION PANEL
ALT: 19 U/L (ref 0–53)
AST: 21 U/L (ref 0–37)
Albumin: 4 g/dL (ref 3.5–5.2)
Alkaline Phosphatase: 66 U/L (ref 39–117)
Bilirubin, Direct: 0.1 mg/dL (ref 0.0–0.3)
Total Bilirubin: 0.3 mg/dL (ref 0.2–1.2)
Total Protein: 6.4 g/dL (ref 6.0–8.3)

## 2021-11-27 NOTE — Progress Notes (Signed)
PCP Cameron Crouch, MD  HPI   IOV 07/10/2017  Chief Complaint  Patient presents with   Advice Only    Referred by [facility] due to SOB.  PFT done 05/28/17.  Pt has been having issues with SOB x4 months especially with exertion and has some mild chest tightness. Denies any cough.    81 year old male referred by Dr. Rayann [name] for evaluation of shortness of breath after cardiac etiologies ruled out.  He tells me that he is a remote smoker.  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  He has a long-standing history of rheumatoid arthritis followed by Dr. Jefm [name] in [place].  He is to be on methotrexate for many years and stopped taking it because of cardiac dysfunction [he personally is convinced that methotrexate because this].  He was then on leflunomide as of 2017 but is currently not on it.  His last rheumatoid factor and CCP antibodies were negative on my personal chart review of the outside records in 2016.    Now for the last 3 or 6 months he is got insidious onset of shortness of breath that is slowly progressive.  His dyspnea on exertion relieved by rest.  Class II-3 activities.  There I  s no associated cough or orthopnea proximal nocturnal dyspnea.  He did have some edema but this got cleared up but the dyspnea is continuing to get worse.  In the last few months he has had a cardiac echo that showed pulmonary hypertension.  Did have cardiac stress test that is normal.  Had pulmonary function test that showed isolated reduction in diffusion capacity and therefore he has been referred here.  Walking desaturation test on 07/10/2017 185 feet x 3 laps on ROOM AIR:  did NOT desaturate. Rest pulse ox was 100%, final pulse ox was 98%. HR response 60/min at rest to 121/min at peak exertion. Patient Cameron [name]  Did not Desaturate < 88% . Cameron [name] did not  Desaturated </=  3% points. Cameron [name] yes did get tachyardic   OV 08/04/2017  Chief Complaint  Patient presents with   Follow-up    ILD    Follow-up interstitial lung disease workup  After the last visit no interim problems.  He has some chronic pedal edema that he will talk to about with his primary care physician.  He did see Dr. Jefm [name] rheumatologist in July 15, 2017.  I reviewed his notes.  It is not specifically indicate patient has nonspecific seronegative arthritis with a differential diagnosis of seronegative rheumatoid arthritis versus psoriatic.  Patient still seems to think the root of all his problems is the methotrexate he took for over 10 years.  At this point in time there is no decompensation.  As part of the ILD workup his pulmonary function test shows mild reduction in diffusion capacity.  Correlating with this is evidence of pulmonary hypertension on the echo and high-resolution CT chest enlarged pulmonary arteries.  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  His autoimmune panel is negative.  And so the vasculitis panel and hypersensitivity pneumonitis panel.    IMPRESSION: 1. Mild subpleural reticular densities in the posterolateral aspects of both lower lobes are suspicious for mild fibrotic interstitial lung disease such as nonspecific interstitial  pneumonitis. Early/mild usual interstitial pneumonitis is not excluded. 2. Aortic atherosclerosis (ICD10-170.0). Coronary artery calcification. 3. Enlarged pulmonary arteries, indicative of pulmonary arterial Hypertension.- > in echo  Nov 2018: Pulmonary arteries: Systolic pressure was moderately increased.   PA peak pressure: 55 mm Hg (S). 4. Left renal stone, partially imaged.     Electronically Signed   By: Cameron [name] M.D.   On: 07/22/2017 15:03  OV 11/03/2017  Chief Complaint  Patient presents with   Follow-up    Pt has SOB with  exertion, some dry cough.     Follow-up suspected interstitial lung disease in the setting of rheumatoid arthritis and long methotrexate intake  He presents with his wife.  At the time of last visit I was not fully convinced that he had interstitial lung disease.  His CT scan indicated presence of pulmonary hypertension and so did his echocardiogram.  Therefore I referred him back to Dr. Rayann [name] cardiology.  In the spring 2019 he did have a right heart catheterization and left heart catheterization that showed mild pulmonary hypertension but also coronary artery blockage.  He status post 2 stents.  After that his shortness of breath improved but he tells me overall his fatigue level has not improved.  In talking to him I find out that he exercises 5 times a week doing heart track 2 times a week and the other 3 days walking a mile each time and doing weight lifting.  It appears that he takes a 20-minute nap after these exercises and then feels reenergized.  His mother feels that he does not have the effort tolerance as his younger days but he denies having any symptoms of chest pain or shortness of breath or cough when he does these heavy exertion the weight lifting or walking a mile out doing heart track.  In fact in the walking desaturation test today he walked extremely fast and had no problems.  In terms of his possible interstitial lung disease he had pulmonary function test today and felt to show some improvement and on exam he does not have any crackles.  Also his wife tells me that for the last 2 months he is using CPAP for sleep apnea and this is also helping him.  Right Heart Pressures RHC Procedural Findings: Hemodynamics (mmHg) RA mean 2 RV 42/6 PA 42/8, mean 21 PCWP mean 7 LV 134/8 AO 147/52       OV 06/01/2019  Subjective:  [name], male , DOB: [birth year] , age 81 y.o. , MRN: [id number] , ADDRESS: [place] [id number]   06/01/2019 -   Chief  Complaint  Patient presents with   Follow-up    Pt states he has been doing okay since last visit but states he has been having a little more SOB x4 weeks now. Pt also has occ coughing with yellow phlegm.   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 -returns for follow-up.  I personally have not seen him since the summer 2019.  He says overall he has been stable.  In August 2020 he had a CT scan of the chest that confirmed the presence of interstitial lung disease in the setting of his rheumatoid arthritis.  He was stable.  His pulmonary function test was stable.  He says now in the last 2 months has had a decline in shortness of breath.  There is  also some cough with sputum production but that has resolved.  It is definitely present with exertion but relieved by rest.  His walking desaturation test compared to 18 months ago is roughly the same except that he is very tachycardic.  He does have a pacemaker.  He has an appoint with Dr. Rayann [name] his electrophysiologist today.  His symptom scores are listed below.  SYMPTOM SCALE - ILD 06/01/2019   O2 use no  Shortness of Breath 0 -> 5 scale with 5 being worst (score 6 If unable to do)  At rest 0  Simple tasks - showers, clothes change, eating, shaving 0  Household (dishes, doing bed, laundry) 0  Shopping 0  Walking level at own pace 2  Walking keeping up with others of same age 64  Walking up Stairs 3  Walking up [place] 3  Total (40 - 48) Dyspnea Score 10  How bad is your cough? 1  How bad is your fatigue 2       ROS - per HPI     OV 08/01/2019 - telephine visit - identified with 2 person identifier. Telephone visit - limits, risks benefits explained  Subjective:  [name], male , DOB: [birth year] , age 84 y.o. , MRN: [id number] , ADDRESS: [place] [id number]   08/01/2019 -  Follow-up  interstitial lung disease in the  setting of rheumatoid arthritis and long methotrexate intake   HPI Cameron [name] 81 y.o. - similar dyspnea compared to JAn 2021.  No better nor worse.  After last visit he has seen cardiology x2.  It appears the final conclusion is that diastolic dysfunction might be contributing to his shortness of breath.  But overall not major changes in his cardiac care.  He tells me that overall he is stable.  He had spirometry and DLCO the DLCO itself appears to be reduced compared to the recent 1 but stable compared to older ones.  The FVC suggest decline.  Patient himself feels stable.  Overall some mixed picture.  Last high-resolution CT chest was October 2020.    IMPRESSION: 1. 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 There is no significant air trapping on expiratory phase imaging. Findings are not significantly changed compared to prior examinations and remain in an "indeterminate for UIP" pattern by ATS pulmonary fibrosis criteria, differential considerations including both UIP and NSIP.   2.  Coronary artery disease and aortic atherosclerosis.   3.  Left nephrolithiasis.     Electronically Signed   By: Eddie Candle M.D.   On: 01/12/2019 15:08    OV 10/17/2019  Subjective:  [name], male , DOB: 1940/12/27 , age 36 y.o. , MRN: [id number] , ADDRESS: [place] [address]   10/17/2019 -   Chief Complaint  Patient presents with   Follow-up    pt states sobwhen doing activities.   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 -presents for follow-up after having his spirometry and DLCO and high-resolution CT chest.  Overall he  feels stable compared to the last visit but he says definitely compared to 2 years ago his symptoms are worse.  Compared to 1 year ago his symptoms  are the same.  He is kind of leery of taking new medications.  His high-resolution CT scan of the chest indicates mild progression since 2019 February.  His pulmonary function tests also indicate progression compared to 2 years ago but fluctuant in more recent times.  He is reporting agent orange exposure and is wondering if his ILD could be related to that.  He reminded me that he is already on the ILD-pro registry study.  His next scheduled visit is in October 2021.   High-resolution CT chest May 2021 Lungs/Pleura: Peripheral and basilar predominant subpleural interlobular and intra lobular septal thickening and ground-glass. Findings persist on prone imaging and appear similar to 01/12/2019 but may be minimally progressive from 07/22/2017. 4 mm peripheral left lower lobe nodule (14/101), stable from 07/22/2017 and considered benign. Lungs are otherwise clear. No pleural fluid. Airway is unremarkable. Mild air trapping.   Upper Abdomen: Visualized portions of the liver and adrenal glands are unremarkable. Stones are seen in the kidneys bilaterally. Spleen and visualized portions of the pancreas, stomach and bowel are grossly unremarkable. Cholecystectomy. No upper abdominal adenopathy.   Musculoskeletal: Degenerative changes in the spine. No worrisome lytic or sclerotic lesions.   IMPRESSION: 1. Pulmonary parenchymal pattern of fibrosis appears grossly stable from 01/12/2019 but may be minimally progressive from 07/22/2017. 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 Usual interstitial pneumonitis is certainly not excluded. Findings are indeterminate for UIP per consensus guidelines: Diagnosis of Idiopathic Pulmonary Fibrosis: An Official ATS/ERS/JRS/ALAT Clinical Practice Guideline. [place], Iss 5, [id number], Jan 24 2017. 2. Bilateral renal stones. 3. Aortic atherosclerosis (ICD10-I70.0). Coronary artery calcification. 4. Enlarged pulmonic trunk, indicative of pulmonary arterial hypertension.     Electronically Signed   By: Cameron [name] M.D.   On: 10/10/2019 14:00  ROS - per HPI     OV 06/29/2020  Subjective:  [name], male , DOB: Sep 14, 1940 , age 68 y.o. , MRN: [id number] , [address]: [address] [place] [address] PCP Cameron Crouch, MD Patient Care Team: Cameron Crouch, MD as PCP - General (Unknown Physician Specialty) [name], MD as PCP - Electrophysiology (Cardiology) Troy Sine, MD as PCP - Cardiology (Cardiology) Sueanne Margarita, MD as PCP - Sleep Medicine (Cardiology)  This Provider for this visit: Treatment Team:  Attending Provider: Brand Males, MD    06/29/2020 -   Chief Complaint  Patient presents with   Follow-up    SOB unchanged, slight nonproductive cough. Esbriet doing well.   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 Normal echo Jan 2021   HPI Cameron [name] 81 y.o. -returns for follow-up.  He presents with his wife.  Last seen in May 2021.  After that in June 2021 when he started pirfenidone for progressive ILD.  He tells me that he has been tolerating pirfenidone just fine.  Of note he has not had any liver function test since he started pirfenidone.  He is not having any GI side effects of skin side effects from the pirfenidone.  In the last 6 months his ILD stable according to his history.  His walking desaturation test and symptom scores are stable.  He is up-to-date with his Covid vaccine.  He is on leflunomide.  He is on the ILD-pro registry  study with a last visit was in November 2021.  He needs a visit every 6 months.  He is willing to get a Covid IgG checked because he is immunosuppressed.  This is in response to humoral immunity to vaccine     OV 04/09/2021  Subjective:  [name], male , DOB: 01-16-1941 , age 81 y.o. 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 Normal echo Jan 2021  HPI Cameron [name] 81 y.o. -returns for follow-up.  He tells me that in the last 6 months since his last visit he is having progressively more shortness of breath with exertion relieved by rest.  In fact his dyspnea score shows worsening to a total score of 9 compared to earlier in the year.  But there is no worsening cough.  He had pulmonary function test that shows continued stability since 2021 feb but definitely worse since 2019 and 2020.  In other words the stability correlates with him starting pirfenidone.  Explained to him that the pirfenidone is helping his ILD stability.  Nevertheless his symptoms are worse.  He had echocardiogram  yesterday that shows continued elevation in pulmonary artery pressures [2019 borderline elevation] and grade 2 diastolic dysfunction.  Of note he had his ILD-Pro registry research visit today. Last liver function test for drug-induced liver injury monitoring was in February 2022.  GFR at that time was 57.  In March 2022.  His wife has suspected ILD and she is also with him in this visit.  I saw her for a scheduled visit just earlier.      OV 06/13/2021  Subjective:  [name], male , DOB: 04/11/1941 , age 53 y.o. , MRN: [id number] , ADDRESS: [place] [id number] PCP Cameron Crouch, MD Patient Care Team: Cameron Crouch, MD as PCP - General (Unknown Physician Specialty) [name], MD as PCP - Electrophysiology (Cardiology) Troy Sine, MD as PCP - Cardiology (Cardiology) Sueanne Margarita, MD as PCP - Sleep Medicine (Cardiology)  This Provider for this visit: Treatment Team:  Attending Provider: Brand Males, MD    06/13/2021 -   Chief Complaint  Patient presents with   Follow-up    Pt is following up after recent heart cath.    HPI KYRI DAI 81 y.o. -returns for follow-up to discuss the implications of the right heart cath results.  He had right heart cath on 05/29/2021.  Around this time he also  had some mild anemia that is documented below.  But his recent creatinine is normal.  His right heart cath shows worsening of mean arterial pressure compared to a few years ago but his PVR is low and his wedge is high.  He also has moderate mitral regurgitation and had a high V wave.  Compared to last visit he stable but over time he said worsening dyspnea.  He is here with his wife.  I told them both that this looks like pulmonary venous hypertension.  He does not have edema.  We discussed the possibility of doing Lasix therapy.  I told him he does not meet official criteria for inhaled treprostinil and WHO group 3 pulmonary  hypertension.  For inhaled treprostinil he would need a wedge less than 15 and a PVR greater than 3.  He does not have that.  He is agreed to do Lasix at this point.  He will have to follow-up with Dr. [name] for this.  In terms of his interstitial lung disease he has a surveillance CT scan coming up in March 2023 [last CT was then May 2021].  I think we will postpone this and just see him in 4 to 6 months for ILD follow-up given recent stability and pulmonary function test.  What he needs is follow-up for his shortness of breath and to see how the Lasix is performing.  For this we will make a nurse practitioner visit and also encouraged him to go and see Dr. Claiborne Billings.  CT Chest data  No results found.   07/11/2021: Today - follow up Patient presents today with wife via virtual visit for follow up after being started on lasix for mild pulmonary HTN. He states he started the medication but has since stopped due to some bladder issues. He reports his breathing has been stable and overall, he feels pretty well. He denies any lower extremity swelling, palpitations, orthopnea, PND, cough or wheezing. He is doing well on Esbriet. He has developed a rash over the last 3-4 days after being transitioned from metformin to [place]. He denies any facial or tongue swelling, difficulties breathing or swallowing, or N/V.     OV 10/01/2021  Subjective:  [name], male , DOB: 09/29/1940 , age 58 y.o. , MRN: [id number] , ADDRESS: [place] [id number] PCP Cameron Crouch, MD Patient Care Team: Cameron Crouch, MD as PCP - General (Unknown Physician Specialty) [name], MD as PCP - Electrophysiology (Cardiology) Troy Sine, MD as PCP - Cardiology (Cardiology) Sueanne Margarita, MD as PCP - Sleep Medicine (Cardiology)  This Provider for this visit: Treatment Team:  Attending Provider: Brand Males, MD    10/01/2021 -   Chief Complaint  Patient  presents with   Follow-up    PFT performed today.  Pt states he has been doing good since last visit and denies any complaints.    HPI ESWIN WORRELL 81 y.o. -returns for routine follow-up.  After I saw him in January 2023 we gave him a trial of Lasix because of diastolic dysfunction and pulmonary venous hypertension but this did not help.  He denied acute video visit in March 2023 because of COVID-19.  He took antiviral.  Currently doing well except for the fact that he has had increased fatigue compared to baseline [the symptom score].  The fatigue is significant especially by the afternoon.  Also for the last few weeks he has had diarrhea  that is moderate in intensity.  There is no changes in his medications.  Over time his creatinine has gotten worse.  He does have chronic kidney disease and is seen his primary care.  He also tells me that he was taking potassium and recently has had hyperkalemia.  This was getting worse as of yesterday with a potassium of 6.1 mEq.  This was at outside facility and I was able to review the lab results.  His primary care is intently watching this.  He is off his diuretics and potassium supplementation.  I have instructed him to have repeat potassium today.  In terms of his ILD it is stable.  Pulmonary function tests are stabilized.  His CT scan of the chest was reviewed and this shows a stable ILD in the last 2 years.  Unclear to me if he is still on leflunomide or not. He is supposed have a research registry follow-up visit today.  Creatinine profile shows a slow worsening over time.          OV 11/27/2021  Subjective:  [name], male , DOB: 1940/12/30 , age 38 y.o. , MRN: [id number] , ADDRESS: [place] [id number] PCP Cameron Crouch, MD Patient Care Team: Cameron Crouch, MD as PCP - General (Unknown Physician Specialty) [name], MD as PCP - Electrophysiology (Cardiology) Troy Sine, MD as PCP -  Cardiology (Cardiology) Sueanne Margarita, MD as PCP - Sleep Medicine (Cardiology)  This Provider for this visit: Treatment Team:  Attending Provider: Brand Males, MD    11/27/2021 -   Chief Complaint  Patient presents with   Follow-up    Pt to discuss Esbriet. Pt still doing good since LOV.    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 Pn med list May 2023  = Started pirfenidone June 2021  - ILD-Pro registry stdy  - last visit 04/09/2021  - Last HRCT May 2021 -. May 2023 without change (indeterminate)  Abnormal Echo - Gr 2 ddx Nov 2022   - ef 60%  - Gr 2 ddx  - RVSP 54  - Mitral and Tricuspid Mod regurg   RHC 05/29/21 - Mild pulmonary hypertension in this patient with interstitial lung disease with elevated RV 54/12; PA pressure 55/18; mean 31 mmHg.  PVR 2.6 WU. PW mean pressure is 17 mmHg, with V wave 24 c/w his echo documented mild - moderate mitral regurgitation.   CKD -  -07/13/2019: Creatinine 1.2 mg percent -05/23/2021 creatinine 1.15 mg percent  - 06/13/21 creat 1.52m%, GFR 39 - 09/30/21 creat 1.648mFR 42 - 10/16/21 - creat 1.8436m- GFR 34  HPI JamCaryl [name] 48o. -returns for follow-up.  At the last visit in May 2023 because of rising creatinine we reduce his pirfenidone dose particularly in the setting of fatigue.  It seems lowering the pirfenidone dose does not really help the fatigue but it does seem to overall make him feel better.  He is stable.  His dyspnea is better.  His diarrhea is very minimal to nonexistent.  He had pulmonary function test and this shows continued stability since being on pirfenidone.  He had ILD-Pro registry visit in the spring 2023.  His next visit is due sometime around October 2023.  We discussed his rising creatinine.  I advised him to stay on the lower dose of the pirfenidone.  This  would be 2 pills 3 times daily.  He did indicate and his  wife also indicated that he is having difficult time controlling his hyperkalemia.  Review of his medication shows that he is on ARB s [telmisartan].  Discussed this is a potential contributory etiology for his hyperkalemia.  He is willing to get his creatinine and his potassium checked today in the office.  But his primary care is the one who is managing that.   Noted he is on fish oil but he says this actually helps him with his arthritis.   SYMPTOM SCALE - ILD 10/17/2019 Started pirfenidone June 2021 06/29/2020  04/09/2021  10/01/2021 Esbriet lower dose since may 2023  O2 use ra ra ra ra  Shortness of Breath 0 -> 5 scale with 5 being worst (score 6 If unable to do)     At rest 0 0 0 0  Simple tasks - showers, clothes change, eating, shaving 1 1 0 0  Household (dishes, doing bed, laundry) 1 0 0 0  Shopping 0 0 1 0  Walking level at own pace 2 0 4 0  Walking up Stairs _0 Total (30-36) Dyspnea Score _1 How bad is your cough? x 1  0  How bad is your fatigue _2 How bad is nausea 0 0 0 0  How bad is vomiting?  0 0 0 0  How bad is diarrhea? 1 0 0 0.5  How bad is anxiety? _3 How bad is depression _4 Simple office walk 185 feet x  3 laps goal with forehead probe 11/03/2017  06/01/2019  06/29/2020  04/09/2021  10/01/2021   O2 used Room air Room air  ra ra  Number laps completed _5 Comments about pace Fast very  99% and 66     Resting Pulse Ox/HR 100% and 70/min 99% and 66/min 97% and 71 100% and Pulse 77 100% and  HR 67  Final Pulse Ox/HR 98% and 121/min 98% and 120 min 98% and 121/min 99% and HR 114 97% and HR 112  Desaturated </= 88% no    no  Desaturated <= 3% points no    Yes 3 pnts  Got Tachycardic >/= 90/min yes    yes  Symptoms at end of test none Mild dyspea No dyspnea Dyspnea at end Noe copmpaint  Miscellaneous comments veryu fast Mod pace fast Fast pace moderate   CT Chest data  No results found.    PFT     Latest Ref Rng &  Units 10/01/2021   10:03 AM 04/17/2020    9:34 AM 10/13/2019    3:53 PM 07/21/2019    3:53 PM 01/17/2019   12:39 PM 11/03/2017    2:10 PM 05/28/2017    1:28 PM  PFT Results  FVC-Pre L 3.27  3.05  3.08  2.92  3.41  3.59  3.29   FVC-Predicted Pre % 93  86  86  81  94  98  90   FVC-Post L       3.28   FVC-Predicted Post %       89   Pre FEV1/FVC % % 84  81  81  82  82  83  87   Post FEV1/FCV % %       83   FEV1-Pre L 2.75  2.46  2.49  2.40  2.78  2.98  2.85   FEV1-Predicted Pre % 112  98  98  94  107  114  109   FEV1-Post L       2.72   DLCO uncorrected ml/min/mmHg 18.84  19.38  19.58  20.08  21.58  20.97  20.35   DLCO UNC% % 85  87  87  89  96  74  71   DLCO corrected ml/min/mmHg 18.63  19.38  19.58  20.08      DLCO COR %Predicted % 84  87  87  89      DLVA Predicted % 92  105  101  112  104  89  92   TLC L       5.47   TLC % Predicted %       84   RV % Predicted %       80        has a past medical history of Appendicitis, Atrial fibrillation ([place]) (12/12/2013), BPH (benign prostatic hypertrophy), CAD (coronary artery disease) (12/2015), Chest pain (12/03/2015), CHF (congestive heart failure) ([place]), Complete heart block ([place]), Coronary artery disease, Coronary artery disease involving native coronary artery of native heart with unstable angina pectoris ([place]) (08/11/2017), History of blood transfusion (1968), History of kidney stones, 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   reports that he quit smoking about 49 years ago. His smoking use included cigarettes. He has a 5.00 pack-year  smoking history. He quit smokeless tobacco use about 48 years ago.  His smokeless tobacco use included chew.  Past Surgical History:  Procedure Laterality Date   BACK SURGERY     CARDIAC CATHETERIZATION N/A 12/28/2015   Procedure: Left Heart Cath and Coronary Angiography;  Surgeon: Belva Crome, MD;  Location: [facility];  Service: Cardiovascular;  Laterality: N/A;   CATARACT EXTRACTION W/ INTRAOCULAR LENS  IMPLANT, BILATERAL Bilateral    CORONARY ANGIOPLASTY WITH STENT PLACEMENT  08/11/2017   "2 stents"   CORONARY STENT INTERVENTION N/A 08/11/2017   Procedure: CORONARY STENT INTERVENTION;  Surgeon: Troy Sine, MD;  Location: [facility];  Service: Cardiovascular;  Laterality: N/A;   CYSTOSCOPY W/ STONE MANIPULATION     INGUINAL HERNIA REPAIR Left    INSERT / REPLACE / REMOVE PACEMAKER  12/09/2011   SJM Accent DR RF implanted by DR Allred for complete heart block and syncope   JOINT REPLACEMENT     LAPAROSCOPIC CHOLECYSTECTOMY     LEAD REVISION/REPAIR N/A 08/09/2020   Procedure: LEAD REVISION/REPAIR;  Surgeon: [name], MD;  Location: [facility];  Service: Cardiovascular;  Laterality: N/A;   LITHOTRIPSY     LUMBAR [place]     "removed arthritis and spurs"   PERMANENT PACEMAKER INSERTION N/A 12/09/2011   Procedure: PERMANENT PACEMAKER INSERTION;  Surgeon: [name], MD;  [facility];  Service: Cardiovascular;  Laterality: N/A;   PPM GENERATOR CHANGEOUT N/A 08/09/2020   Procedure: PPM GENERATOR CHANGEOUT;  Surgeon: [name], MD;  Location: [facility];  Service: Cardiovascular;  Laterality: N/A;   REPLACEMENT TOTAL KNEE Right    RIGHT HEART CATH N/A 05/29/2021   Procedure: RIGHT HEART CATH;  Surgeon:  Troy Sine, MD;  Location: [facility];  Service: Cardiovascular;  Laterality: N/A;   RIGHT/LEFT HEART CATH AND CORONARY ANGIOGRAPHY N/A 08/11/2017   Procedure: RIGHT/LEFT HEART CATH AND CORONARY ANGIOGRAPHY;  Surgeon:  Larey Dresser, MD;  Location: [facility];  Service: Cardiovascular;  Laterality: N/A;   TRANSURETHRAL RESECTION OF PROSTATE  2017/2018    Allergies  Allergen Reactions   Ace Inhibitors Swelling    Tongue swelling, angioedema   Lisinopril     Other reaction(s): Lip swelling, O/E - lip swelling   Celecoxib Rash    Skin rash     Immunization History  Administered Date(s) Administered   Fluad Quad(high Dose 65+) 02/11/2020   Influenza Inj Mdck Quad Pf 03/25/2021   Influenza, High Dose Seasonal PF 03/26/2017   Influenza-Unspecified 05/03/2015, 02/13/2016, 03/26/2016, 03/19/2017, 03/03/2018, 03/17/2019, 04/09/2020, 04/18/2021   PFIZER Comirnaty(Gray Top)Covid-19 Tri-Sucrose Vaccine 06/11/2019, 07/02/2019, 07/04/2019, 07/18/2019, 03/01/2020, 11/15/2020   PFIZER(Purple Top)SARS-COV-2 Vaccination 06/11/2019, 07/04/2019, 07/18/2019, 03/01/2020   Pneumococcal Conjugate-13 02/11/2020   Pneumococcal Polysaccharide-23 12/10/2011, 01/30/2018   Pneumococcal-Unspecified 03/26/2016, 02/04/2018   Tdap 01/17/2016, 02/04/2018   Zoster Recombinat (Shingrix) 08/14/2020, 11/29/2020    Family History  Problem Relation Age of Onset   Pancreatic cancer Mother    Bone cancer Father    Alzheimer's disease Sister    Heart attack Paternal Uncle    Arthritis Maternal Grandfather    Alzheimer's disease Paternal Grandmother    Lung cancer Paternal Grandfather      Current Outpatient Medications:    acetaminophen (TYLENOL) 500 MG tablet, Take 500 mg by mouth every 6 (six) hours as needed for moderate pain., Disp: , Rfl:    aspirin 81 MG EC tablet, Take 81 mg by mouth daily., Disp: , Rfl:    clobetasol (OLUX) 0.05 % topical foam, Apply twice daily to affected areas on scalp, ears and elbow. Avoid applying to face, groin, and axilla., Disp: 50 g, Rfl: 2   diltiazem (CARDIZEM CD) 120 MG 24 hr capsule, TAKE 1 CAPSULE BY MOUTH EVERY DAY, Disp: 90 capsule, Rfl: 0   gabapentin (NEURONTIN) 300 MG  capsule, Take 300 mg by mouth at bedtime., Disp: , Rfl:    glimepiride (AMARYL) 2 MG tablet, Take 2 mg by mouth every morning., Disp: , Rfl:    JARDIANCE 10 MG TABS tablet, Take 10 mg by mouth daily., Disp: , Rfl:    nebivolol (BYSTOLIC) 5 MG tablet, Take 5 mg by mouth daily., Disp: , Rfl:    nitroGLYCERIN (NITROSTAT) 0.4 MG SL tablet, Place 1 tablet (0.4 mg total) under the tongue every 5 (five) minutes as needed for chest pain., Disp: 75 tablet, Rfl: 2   Omega-3 Fatty Acids (OMEGA-3 FISH OIL PO), Take 1 capsule by mouth daily., Disp: , Rfl:    Pirfenidone (ESBRIET) 267 MG TABS, Take 2 tablets (534 mg total) by mouth 3 (three) times daily with meals. **NOTE LOW DOSE due to kidney function**, Disp: 180 tablet, Rfl: 5   pravastatin (PRAVACHOL) 20 MG tablet, Take 1 tablet (20 mg total) by mouth every evening. *NEEDS OFFICE VISIT FOR FURTHER REFILLS*, Disp: 90 tablet, Rfl: 0   rivaroxaban (XARELTO) 20 MG TABS tablet, Take 1 tablet (20 mg total) by mouth daily with supper., Disp: 90 tablet, Rfl: 2   Tapinarof (VTAMA) 1 % CREA, Apply 1 application topically daily., Disp: 60 g, Rfl: 0   telmisartan (MICARDIS) 80 MG tablet, Take 80 mg by mouth daily., Disp: , Rfl:    terbinafine (  LAMISIL) 1 % cream, Apply once daily to buttocks and left thigh rash until clear, Disp: 42 g, Rfl: 2   thyroid (ARMOUR) 90 MG tablet, Take 90 mg by mouth every morning. , Disp: , Rfl:    Trospium Chloride 60 MG CP24, TAKE 1 CAPSULE BY MOUTH DAILY, Disp: 30 capsule, Rfl: 2   TURMERIC PO, Take 1 capsule by mouth daily., Disp: , Rfl:   Current Facility-Administered Medications:    sodium chloride flush (NS) 0.9 % injection 3 mL, 3 mL, Intravenous, Q12H, Troy Sine, MD  Facility-Administered Medications Ordered in Other Visits:    ondansetron (ZOFRAN) 4 mg in sodium chloride 0.9 % 50 mL IVPB, 4 mg, Intravenous, Q6H PRN, Ashok Pall, MD      Objective:   Vitals:   11/27/21 1510  BP: 118/60  Pulse: 62  Temp: 98.6 F  (37 C)  TempSrc: Oral  SpO2: 96%  Weight: 183 lb 12.8 oz (83.4 kg)  Height: _0  (1.702 m)    Estimated body mass index is 28.79 kg/m as calculated from the following:   Height as of this encounter: _1  (1.702 m).   Weight as of this encounter: 183 lb 12.8 oz (83.4 kg).  _2 @  Filed Weights   11/27/21 1510  Weight: 183 lb 12.8 oz (83.4 kg)     Physical Exam General: No distress. Looks well Neuro: Alert and Oriented x 3. GCS 15. Speech normal Psych: Pleasant Resp:  Barrel Chest - no.  Wheeze - no, Crackles - YES BASE, No overt respiratory distress CVS: Normal heart sounds. Murmurs - no Ext: Stigmata of Connective Tissue Disease - no HEENT: Normal upper airway. PEERL +. No post nasal drip        Assessment:       ICD-10-CM   1. Interstitial lung disease due to connective tissue disease ([place])  J84.89    M35.9     2. 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 test is every so slowly and steadiy progressive  sinc 2019 and 2020 but stable since  visit May 2021 and starting esbriet in June 2021 through July 2023  - Hx of covid March 2023 - did not affect your fibrosis  Plan - check LFT 11/27/2021  - -Continue Esbriet  - [eat with food, space adequately between meals and apply sunscreen at all times - continue lower dose  2 pills three times daily  -Continue daily exercise as before - continue ILD-PRO registry participation as before (last visit April 2023 with them)    Shortness of breath Grade 2 diastolic dysfunction Pulmonary venous hypertension  -curerntly stabkle  Plan  - per Dr  Claiborne Billings  Hyperkalemia 09/30/21 - 6.26mq  - undrestood still onging issues. 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 Brand Males, M.D., F.C.C.P,  Pulmonary and Critical Care Medicine Staff Physician, [facility] Director - Interstitial Lung Disease  Program  Pulmonary [place] at [place], [place], [address]  Pager: [phone], If no answer or between  15:00h - 7:00h: call [phone] Telephone: [phone]  3:43 PM 11/27/2021

## 2021-11-27 NOTE — Patient Instructions (Addendum)
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 walk test is every so slowly and steadiy progressive  sinc 2019 and 2020 but stable since  visit May 2021 and starting esbriet in June 2021 through July 2023  - Hx of covid March 2023 - did not affect your fibrosis  Plan - check LFT 11/27/2021  - -Continue Esbriet  - [eat with food, space adequately between meals and apply sunscreen at all times - continue lower dose  2 pills three times daily  -Continue daily exercise as before - continue ILD-PRO registry participation as before (last visit April 2023 with them)    Shortness of breath Grade 2 diastolic dysfunction Pulmonary venous hypertension  -curerntly stabkle  Plan  - per Dr Claiborne Billings  Hyperkalemia 09/30/21 - 6.60mq  - undrestood still onging issues. Micardis and CKD probably playing a role  Plan  - check BMET  11/27/2021 - talk toPCP Sparks, JLeonie Douglas MD if Micardis coould be playing a role   Followup - - retunr in 3 months - see Dr [name]- 30 min  - symptom score and walk test at followup

## 2021-11-27 NOTE — Addendum Note (Signed)
Addended by: Irine Seal B on: 11/27/2021 03:47 PM   Modules accepted: Orders

## 2021-12-04 DIAGNOSIS — L4050 Arthropathic psoriasis, unspecified: Secondary | ICD-10-CM | POA: Insufficient documentation

## 2021-12-04 DIAGNOSIS — M1120 Other chondrocalcinosis, unspecified site: Secondary | ICD-10-CM | POA: Insufficient documentation

## 2021-12-12 ENCOUNTER — Ambulatory Visit: Payer: Medicare Other | Admitting: Cardiovascular Disease

## 2022-01-02 ENCOUNTER — Ambulatory Visit (INDEPENDENT_AMBULATORY_CARE_PROVIDER_SITE_OTHER): Payer: Medicare Other | Admitting: Dermatology

## 2022-01-02 ENCOUNTER — Encounter: Payer: Self-pay | Admitting: Dermatology

## 2022-01-02 ENCOUNTER — Other Ambulatory Visit: Payer: Self-pay

## 2022-01-02 DIAGNOSIS — L409 Psoriasis, unspecified: Secondary | ICD-10-CM

## 2022-01-02 DIAGNOSIS — L821 Other seborrheic keratosis: Secondary | ICD-10-CM

## 2022-01-02 DIAGNOSIS — R21 Rash and other nonspecific skin eruption: Secondary | ICD-10-CM | POA: Diagnosis not present

## 2022-01-02 DIAGNOSIS — Z85828 Personal history of other malignant neoplasm of skin: Secondary | ICD-10-CM | POA: Diagnosis not present

## 2022-01-02 DIAGNOSIS — B078 Other viral warts: Secondary | ICD-10-CM | POA: Diagnosis not present

## 2022-01-02 DIAGNOSIS — Z1283 Encounter for screening for malignant neoplasm of skin: Secondary | ICD-10-CM | POA: Diagnosis not present

## 2022-01-02 DIAGNOSIS — D229 Melanocytic nevi, unspecified: Secondary | ICD-10-CM

## 2022-01-02 DIAGNOSIS — L814 Other melanin hyperpigmentation: Secondary | ICD-10-CM

## 2022-01-02 DIAGNOSIS — D2371 Other benign neoplasm of skin of right lower limb, including hip: Secondary | ICD-10-CM

## 2022-01-02 DIAGNOSIS — L578 Other skin changes due to chronic exposure to nonionizing radiation: Secondary | ICD-10-CM

## 2022-01-02 DIAGNOSIS — D1800 Hemangioma unspecified site: Secondary | ICD-10-CM

## 2022-01-02 MED ORDER — TERBINAFINE HCL 1 % EX CREA: TOPICAL_CREAM | CUTANEOUS | 2 refills | Status: DC

## 2022-01-02 NOTE — Progress Notes (Signed)
Follow-Up Visit   Subjective  Cameron [name] is a 81 y.o. male who presents for the following: Annual Exam (Skin cancer screening.  HxSCC right upper arm) and Rash (7 month recheck. Buttock and thigh. Has been using Lamisil cream. Reports rash has almost resolved ).   The following portions of the chart were reviewed this encounter and updated as appropriate:  Tobacco  Allergies  Meds  Problems  Med Hx  Surg Hx  Fam Hx      Review of Systems: No other skin or systemic complaints except as noted in HPI or Assessment and Plan.   Objective  Well appearing patient in no apparent distress; mood and affect are within normal limits.  A full examination was performed including scalp, head, eyes, ears, nose, lips, neck, chest, axillae, abdomen, back, buttocks, bilateral upper extremities, bilateral lower extremities, hands, feet, fingers, toes, fingernails, and toenails. All findings within normal limits unless otherwise noted below.  Right Upper Arm x1 Verrucous papules -- Discussed viral etiology and contagion.   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 07/24/2020 - No evidence of recurrence today - No lymphadenopathy - Recommend regular full body skin exams - Recommend daily broad spectrum sunscreen SPF 30+ to sun-exposed areas, reapply every 2 hours as needed.  - Call if any new or changing lesions are noted between office visits  Lentigines - Scattered tan macules - Due to sun exposure - Benign-appearing, observe - Recommend daily broad spectrum sunscreen SPF 30+ to sun-exposed areas, reapply every 2 hours as needed. - Call for any changes  Seborrheic Keratoses - Stuck-on, waxy, tan-brown papules and/or plaques  - Benign-appearing - Discussed benign etiology and prognosis. - Observe - Call for any changes  Melanocytic  [place]i - Tan-brown and/or pink-flesh-colored symmetric macules and papules - Benign appearing on exam today - Observation - Call clinic for new or changing moles - Recommend daily use of broad spectrum spf 30+ sunscreen to sun-exposed areas.   Hemangiomas - Red papules - Discussed benign nature - Observe - Call for any changes  Actinic Damage - Chronic condition, secondary to cumulative UV/sun exposure - diffuse scaly erythematous macules with underlying dyspigmentation - Recommend daily broad spectrum sunscreen SPF 30+ to sun-exposed areas, reapply every 2 hours as needed.  - Staying in the shade or wearing long sleeves, sun glasses (UVA+UVB protection) and wide brim hats (4-inch brim around the entire circumference of the hat) are also recommended for sun protection.  - Call for new or changing lesions.  Skin cancer screening performed today.  Dermatofibroma. Right ankle.  - Firm pink/brown papulenodule with dimple sign - Benign appearing - Call for any changes    Other viral warts Right Upper Arm x1  Discussed viral etiology and risk of spread.  Discussed multiple treatments may be required to clear warts.  Discussed possible post-treatment dyspigmentation and risk of recurrence.  Destruction of lesion - Right Upper Arm x1  Destruction method: cryotherapy   Informed consent: discussed and consent obtained   Lesion destroyed using liquid nitrogen: Yes   Outcome: patient tolerated procedure well with no complications   Post-procedure details: wound care instructions given   Additional details:  Prior to procedure, discussed risks of blister formation, small wound, skin dyspigmentation, or rare scar following cryotherapy. Recommend Vaseline ointment to treated areas while healing.  Rash left buttock, left thigh  Ddx tinea vs mycosis fungoides  Pt thinks improved on terbinafine cream but stopped using it  Resume Terbinafine cream twice daily until RTC  If rash  persistent, plan broad shave biopsy     Related Medications terbinafine (LAMISIL) 1 % cream Apply twice daily to buttocks and left thigh rash until clear  Psoriasis Right Elbow - Posterior  Chronic condition with duration or expected duration over one year. Currently well-controlled.  Psoriasis is a chronic non-curable, but treatable genetic/hereditary disease that may have other systemic features affecting other organ systems such as joints (Psoriatic Arthritis). It is associated with an increased risk of inflammatory bowel disease, heart disease, non-alcoholic fatty liver disease, and depression.    Continue Clobetasol foam to affected areas twice daily up to 2 weeks as needed for psoriasis flares. Avoid applying to face, groin, and axilla. Use as directed. Long-term use can cause thinning of the skin.   Continue Vtama cream once daily for psoriasis   Related Medications clobetasol (OLUX) 0.05 % topical foam Apply twice daily to affected areas on scalp, ears and elbow. Avoid applying to face, groin, and axilla.   Return for Rash Follow Up 4-8 weeks, TBSE 1 year.  I, [name], CMA, am acting as scribe for Forest Gleason, MD.   Documentation: I have reviewed the above documentation for accuracy and completeness, and I agree with the above.  Forest Gleason, MD

## 2022-01-02 NOTE — Patient Instructions (Addendum)
Cryotherapy Aftercare  Wash gently with soap and water everyday.   Apply Vaseline daily until healed.   Recommend daily broad spectrum sunscreen SPF 30+ to sun-exposed areas, reapply every 2 hours as needed. Call for new or changing lesions.  Staying in the shade or wearing long sleeves, sun glasses (UVA+UVB protection) and wide brim hats (4-inch brim around the entire circumference of the hat) are also recommended for sun protection.   Recommend taking Heliocare sun protection supplement daily in sunny weather for additional sun protection. For maximum protection on the sunniest days, you can take up to 2 capsules of regular Heliocare OR take 1 capsule of Heliocare Ultra. For prolonged exposure (such as a full day in the sun), you can repeat your dose of the supplement 4 hours after your first dose. Heliocare can be purchased at [employer], at some [facility] or at [email or web address].    Psoriasis Use Clobetasol foam to affected areas twice daily up to 2 weeks as needed for worse psoriasis flares.   Continue Vtama cream once daily for psoriasis   Rash on Buttock/Thigh Resume Terbinafine cream twice daily until you return to office.     Melanoma ABCDEs  Melanoma is the most dangerous type of skin cancer, and is the leading cause of death from skin disease.  You are more likely to develop melanoma if you: Have light-colored skin, light-colored eyes, or red or blond hair Spend a lot of time in the sun Tan regularly, either outdoors or in a tanning bed Have had blistering sunburns, especially during childhood Have a close family member who has had a melanoma Have atypical moles or large birthmarks  Early detection of melanoma is key since treatment is typically straightforward and cure rates are extremely high if we catch it early.   The first sign of melanoma is often a change in a mole or a new dark spot.  The ABCDE system is a way of remembering the signs of melanoma.  A for  asymmetry:  The two halves do not match. B for border:  The edges of the growth are irregular. C for color:  A mixture of colors are present instead of an even brown color. D for diameter:  Melanomas are usually (but not always) greater than 73m - the size of a pencil eraser. E for evolution:  The spot keeps changing in size, shape, and color.  Please check your skin once per month between visits. You can use a small mirror in front and a large mirror behind you to keep an eye on the back side or your body.   If you see any new or changing lesions before your next follow-up, please call to schedule a visit.  Please continue daily skin protection including broad spectrum sunscreen SPF 30+ to sun-exposed areas, reapplying every 2 hours as needed when you're outdoors.   Staying in the shade or wearing long sleeves, sun glasses (UVA+UVB protection) and wide brim hats (4-inch brim around the entire circumference of the hat) are also recommended for sun protection.     Due to recent changes in healthcare laws, you may see results of your pathology and/or laboratory studies on MyChart before the doctors have had a chance to review them. We understand that in some cases there may be results that are confusing or concerning to you. Please understand that not all results are received at the same time and often the doctors may need to interpret multiple results in order to provide you  with the best plan of care or course of treatment. Therefore, we ask that you please give [place] 2 business days to thoroughly review all your results before contacting the office for clarification. Should we see a critical lab result, you will be contacted sooner.   If You Need Anything After Your Visit  If you have any questions or concerns for your doctor, please call our main line at [phone] and press option 4 to reach your doctor's medical assistant. If no one answers, please leave a voicemail as directed and we will  return your call as soon as possible. Messages left after 4 pm will be answered the following business day.   You may also send [place] a message via [place]. We typically respond to MyChart messages within 1-2 business days.  For prescription refills, please ask your pharmacy to contact our office. Our fax number is [phone].  If you have an urgent issue when the clinic is closed that cannot wait until the next business day, you can page your doctor at the number below.    Please note that while we do our best to be available for urgent issues outside of office hours, we are not available 24/7.   If you have an urgent issue and are unable to reach [place], you may choose to seek medical care at your doctor's office, retail clinic, urgent care center, or emergency room.  If you have a medical emergency, please immediately call 911 or go to the emergency department.  Pager Numbers  - Dr. Nehemiah Massed: [phone]  - Dr. Laurence Ferrari: [phone]  - Dr. Nicole Kindred: [phone]  In the event of inclement weather, please call our main line at [phone] for an update on the status of any delays or closures.  Dermatology Medication Tips: Please keep the boxes that topical medications come in in order to help keep track of the instructions about where and how to use these. Pharmacies typically print the medication instructions only on the boxes and not directly on the medication tubes.   If your medication is too expensive, please contact our office at [phone] option 4 or send [place] a message through [place].   We are unable to tell what your co-pay for medications will be in advance as this is different depending on your insurance coverage. However, we may be able to find a substitute medication at lower cost or fill out paperwork to get insurance to cover a needed medication.   If a prior authorization is required to get your medication covered by your insurance company, please allow [place] 1-2 business  days to complete this process.  Drug prices often vary depending on where the prescription is filled and some pharmacies may offer cheaper prices.  The website www.goodrx.com contains coupons for medications through different pharmacies. The prices here do not account for what the cost may be with help from insurance (it may be cheaper with your insurance), but the website can give you the price if you did not use any insurance.  - You can print the associated coupon and take it with your prescription to the pharmacy.  - You may also stop by our office during regular business hours and pick up a GoodRx coupon card.  - If you need your prescription sent electronically to a different pharmacy, notify our office through [facility] or by phone at [phone] option 4.     Si Usted Necesita Algo Despus de Su Visita  Tambin puede enviarnos un mensaje a Lawerance Cruel  de MyChart. Por lo general respondemos a los mensajes de MyChart en el transcurso de 1 a 2 das hbiles.  Para renovar recetas, por favor pida a su farmacia que se ponga en contacto con nuestra oficina. Harland Dingwall de fax es [place] [phone].  Si tiene un asunto urgente cuando la clnica est cerrada y que no puede esperar hasta el siguiente da hbil, puede llamar/localizar a su doctor(a) al nmero que aparece a continuacin.   Por favor, tenga en cuenta que aunque hacemos todo lo posible para estar disponibles para asuntos urgentes fuera del horario de [place], no estamos disponibles las 24 horas del da, los 7 das de la [place].   Si tiene un problema urgente y no puede comunicarse con nosotros, puede optar por buscar atencin mdica  en el consultorio de su doctor(a), en una clnica privada, en un centro de atencin urgente o en una sala de emergencias.  Si tiene Engineering geologist, por favor llame inmediatamente al 911 o vaya a la sala de emergencias.  Nmeros de bper  - Dr. Nehemiah Massed: [phone]  - Dra. Moye:  [phone]  - Dra. Nicole Kindred: [phone]  En caso de inclemencias del [place], por favor llame a Johnsie Kindred principal al [phone] para una actualizacin sobre el [place] de cualquier retraso o cierre.  Consejos para la medicacin en dermatologa: Por favor, guarde las cajas en las que vienen los medicamentos de uso tpico para ayudarle a seguir las instrucciones sobre dnde y cmo usarlos. Las farmacias generalmente imprimen las instrucciones del medicamento slo en las cajas y no directamente en los tubos del [place].   Si su medicamento es muy caro, por favor, pngase en contacto con Zigmund Daniel llamando al [phone] y presione la opcin 4 o envenos un mensaje a travs de Pharmacist, community.   No podemos decirle cul ser su copago por los medicamentos por adelantado ya que esto es diferente dependiendo de la cobertura de su seguro. Sin embargo, es posible que podamos encontrar un medicamento sustituto a Electrical engineer un formulario para que el seguro cubra el medicamento que se considera necesario.   Si se requiere una autorizacin previa para que su compaa de seguros [place] su medicamento, por favor permtanos de 1 a 2 das hbiles para completar este proceso.  Los precios de los medicamentos varan con frecuencia dependiendo del Environmental consultant de dnde se surte la receta y alguna farmacias pueden ofrecer precios ms baratos.  El sitio web www.goodrx.com tiene cupones para medicamentos de Airline pilot. Los precios aqu no tienen en cuenta lo que podra costar con la ayuda del seguro (puede ser ms barato con su seguro), pero el sitio web puede darle el precio si no utiliz Research scientist (physical sciences).  - Puede imprimir el cupn correspondiente y llevarlo con su receta a la farmacia.  - Tambin puede pasar por nuestra oficina durante el horario de atencin regular y Charity fundraiser una tarjeta de cupones de GoodRx.  - Si necesita que su receta se enve electrnicamente a una farmacia diferente,  informe a nuestra oficina a travs de MyChart de [place] o por telfono llamando al [phone] y presione la opcin 4.

## 2022-01-02 NOTE — Progress Notes (Unsigned)
error

## 2022-01-10 ENCOUNTER — Other Ambulatory Visit: Payer: Self-pay | Admitting: Cardiovascular Disease

## 2022-01-10 ENCOUNTER — Ambulatory Visit: Payer: Medicare Other | Admitting: Physician Assistant

## 2022-01-10 NOTE — Progress Notes (Deleted)
Cardiology Office Note:    Date:  01/10/2022   ID:  Cameron [name], DOB 22-Oct-1940, MRN [id number]  PCP:  [name], MD   [facility] Cardiologist:  [name], MD Electrophysiologist:  [name], MD  Sleep Medicine:  Fransico Him, MD { Click to update primary MD,subspecialty MD or APP then REFRESH:1}    Referring MD: [name], MD   No chief complaint on file. ***  History of Present Illness:    Cameron [name] is a 81 y.o. male with a hx of atrial fibrillation on xarelto, CAD, hypothyroidism, CHB s/p PPM, OSA on CPAP, DM, hypertension, ILD, pulmonary hypertension, and RA.  During work-up for ILD, he was found to have aortic atherosclerosis and coronary calcifications with enlarged pulmonary arteries.  He was referred to Dr. Aundra Dubin who performed a diagnostic cardiac catheterization March 2019.  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  He had severe disease in his proximal circumflex and OM1.  Dr. Claiborne Billings was able to successfully stent his bifurcation disease.  He completed 12 months of DAPT.    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  Due to progression of his ILD, he underwent repeat right heart catheterization on 05/29/21 which showed mild pulmonary hypertension with PA pressure 55/18, PW mean pressure 17 mmHg. Mild to moderate MR was also noted. He was recently seen by pulmonology 11/27/21 with PFTs showing some improvement.   Of note, labs on 09/30/21 showed hyperkalemia with K 6.1. Pt was on micardis. Has since resolved  He presents for cardiology follow up.      CAD s/p DES to LCX-OM1 Hyperlipidemia with LDL goal < 70 ASA, bystolic, statin Who follows lipids?    Atrial fibrillation Chronic anticoagulation SSS s/p PPM Maintained on bystolic and cardizem OAC with xarelto   Hypertension  Telmisartan, bystolic, cardizem       Past Medical  History:  Diagnosis Date   Appendicitis    Atrial fibrillation ([place]) 12/12/2013   BPH (benign prostatic hypertrophy)    CAD (coronary artery disease) 12/2015   Cath by Dr [name] reveals distal and small vessel CAD.  Medical therapy advised.   Chest pain 12/03/2015   CHF (congestive heart failure) (HCC)    Complete heart block ([place])    s/p PPM   Coronary artery disease    Coronary artery disease involving native coronary artery of native heart with unstable angina pectoris ([place]) 08/11/2017   History of blood transfusion 1968   "probably; related [place]   History of kidney stones    History of SCC (squamous cell carcinoma) of skin 07/24/2020   right upper arm/excision   Hyperglycemia 11/05/2013   Hyperlipidemia 11/05/2013   Hypertension    Hypothyroidism    Hypothyroidism, unspecified 11/05/2013   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  AHI was 20/h while supine.  Oxygen saturations dropped to 87%.  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   Rheumatoid arthritis ([place])    "hands" (08/11/2017)   Type II diabetes mellitus ([place])     Past Surgical History:  Procedure Laterality Date   BACK SURGERY     CARDIAC CATHETERIZATION N/A 12/28/2015   Procedure:  Left Heart Cath and Coronary Angiography;  Surgeon: Belva Crome, MD;  Location: [facility];  Service: Cardiovascular;  Laterality: N/A;   CATARACT EXTRACTION W/ INTRAOCULAR LENS  IMPLANT, BILATERAL Bilateral    CORONARY ANGIOPLASTY WITH STENT PLACEMENT  08/11/2017   "2 stents"   CORONARY STENT INTERVENTION N/A 08/11/2017   Procedure: CORONARY STENT INTERVENTION;  Surgeon: Troy Sine, MD;  Location: [facility];  Service: Cardiovascular;  Laterality: N/A;   CYSTOSCOPY W/ STONE MANIPULATION     INGUINAL  HERNIA REPAIR Left    INSERT / REPLACE / REMOVE PACEMAKER  12/09/2011   SJM Accent DR RF implanted by DR Allred for complete heart block and syncope   JOINT REPLACEMENT     LAPAROSCOPIC CHOLECYSTECTOMY     LEAD REVISION/REPAIR N/A 08/09/2020   Procedure: LEAD REVISION/REPAIR;  Surgeon: [name], MD;  Location: [facility];  Service: Cardiovascular;  Laterality: N/A;   LITHOTRIPSY     LUMBAR [place]     "removed arthritis and spurs"   PERMANENT PACEMAKER INSERTION N/A 12/09/2011   Procedure: PERMANENT PACEMAKER INSERTION;  Surgeon: [name], MD;  Location[facility];  Service: Cardiovascular;  Laterality: N/A;   PPM GENERATOR CHANGEOUT N/A 08/09/2020   Procedure: PPM GENERATOR CHANGEOUT;  Surgeon: [name], MD;  Location: [place] [facility];  Service: Cardiovascular;  Laterality: N/A;   REPLACEMENT TOTAL KNEE Right    RIGHT HEART CATH N/A 05/29/2021   Procedure: RIGHT HEART CATH;  Surgeon: Troy Sine, MD;  Location: [facility];  Service: Cardiovascular;  Laterality: N/A;   RIGHT/LEFT HEART CATH AND CORONARY ANGIOGRAPHY N/A 08/11/2017   Procedure: RIGHT/LEFT HEART CATH AND CORONARY ANGIOGRAPHY;  Surgeon: Larey Dresser, MD;  Location: [facility];  Service: Cardiovascular;  Laterality: N/A;   TRANSURETHRAL RESECTION OF PROSTATE  2017/2018    Current Medications: No outpatient medications have been marked as taking for the 01/10/22 encounter (Appointment) with Ledora Bottcher, [place].   Current Facility-Administered Medications for the 01/10/22 encounter (Appointment) with Ledora Bottcher, PA  Medication   sodium chloride flush (NS) 0.9 % injection 3 mL     Allergies:   Ace inhibitors, Lisinopril, and Celecoxib   Social History   Socioeconomic History   Marital status: Married    Spouse name: Not on file   Number of children: Not on file   Years of education: Not on file   Highest education level: Not on file  Occupational History    Not on file  Tobacco Use   Smoking status: Former    Packs/day: 1.00    Years: 5.00    Total pack years: 5.00    Types: Cigarettes    Quit date: 07/10/1972    Years since quitting: 49.5   Smokeless tobacco: Former    Types: Chew    Quit date: 1975  Vaping Use   Vaping Use: Never used  Substance and Sexual Activity   Alcohol use: Yes    Alcohol/week: 1.0 standard drink of alcohol    Types: 1 Glasses of wine per week   Drug use: No   Sexual activity: Not Currently  Other Topics Concern   Not on file  Social History Narrative   Not on file   Social Determinants of Health   Financial Resource Strain: Not on file  Food Insecurity: Not on file  Transportation Needs: Not on file  Physical Activity: Not on file  Stress: Not  on file  Social Connections: Not on file     Family History: The patient's ***family history includes Alzheimer's disease in his paternal grandmother and sister; Arthritis in his maternal grandfather; Bone cancer in his father; Heart attack in his paternal uncle; Lung cancer in his paternal grandfather; Pancreatic cancer in his mother.  ROS:   Please see the history of present illness.    *** All other systems reviewed and are negative.  EKGs/Labs/Other Studies Reviewed:    The following studies were reviewed today:  Cameron 11/27/21: RA: A-wave 11, V wave 11; mean 9 RV: 54/12 PA: 55/18; mean 31 PW: A-wave 18, V wave 24; mean 17  Oxygen saturation in the aorta 94%, and pulmonary artery 65%.  By the Fick method, cardiac output 5.4 L/min and cardiac index 2.7 L/min/m.  PVR: 2.6 WU   Coronary stent intervention 08/11/17 Difficult but successful bifurcation stenting of a dominant left circumflex vessel with ultimate insertion of a 2.515 mm  Resolute DES stent into the OM1 one-vessel from its ostium and a 3.018 mm Resolute DES stent inserted extending from the proximal circumflex into the mid AV groove circumflex beyond the OM1 vessel.  The 80% circumflex  marginal stenosis was reduced to 0% in the 90% AV groove circumflex stenosis was reduced to 0%.   RECOMMENDATION: DAPT therapy for minimum of 1 year.  Medical therapy for concomitant CAD as noted in Dr. Claris Gladden report.    EKG:  EKG is *** ordered today.  The ekg ordered today demonstrates ***  Recent Labs: 05/23/2021: Platelets 185 05/29/2021: Hemoglobin 10.9; Hemoglobin 11.2 11/27/2021: ALT 19; BUN 19; Creatinine, Ser 1.31; Potassium 3.7; Sodium 141  Recent Lipid Panel No results found for: "CHOL", "TRIG", "HDL", "CHOLHDL", "VLDL", "LDLCALC", "LDLDIRECT"   Risk Assessment/Calculations:   {Does this patient have ATRIAL FIBRILLATION?[phone]}  No BP recorded.  {Refresh Note OR Click here to enter BP  :1}***         Physical Exam:    VS:  There were no vitals taken for this visit.    Wt Readings from Last 3 Encounters:  11/27/21 183 lb 12.8 oz (83.4 kg)  10/01/21 186 lb 3.2 oz (84.5 kg)  06/13/21 189 lb 3.2 oz (85.8 kg)     GEN: *** Well nourished, well developed in no acute distress HEENT: Normal NECK: No JVD; No carotid bruits LYMPHATICS: No lymphadenopathy CARDIAC: ***RRR, no murmurs, rubs, gallops RESPIRATORY:  Clear to auscultation without rales, wheezing or rhonchi  ABDOMEN: Soft, non-tender, non-distended MUSCULOSKELETAL:  No edema; No deformity  SKIN: Warm and dry NEUROLOGIC:  Alert and oriented x 3 PSYCHIATRIC:  Normal affect   ASSESSMENT:    No diagnosis found. PLAN:    In order of problems listed above:  ***      {Are you ordering a CV Procedure (e.g. stress test, cath, DCCV, TEE, etc)?   Press F2        :[id number]}    Medication Adjustments/Labs and Tests Ordered: Current medicines are reviewed at length with the patient today.  Concerns regarding medicines are outlined above.  No orders of the defined types were placed in this encounter.  No orders of the defined types were placed in this encounter.   There are no Patient Instructions on  file for this visit.   Signed, Ledora Bottcher, [place]  01/10/2022 12:09 PM    [place]

## 2022-01-13 ENCOUNTER — Encounter: Payer: Self-pay | Admitting: Urology

## 2022-01-13 ENCOUNTER — Ambulatory Visit (INDEPENDENT_AMBULATORY_CARE_PROVIDER_SITE_OTHER): Payer: Medicare Other | Admitting: Urology

## 2022-01-13 VITALS — BP 136/75 | HR 82 | Ht 67.0 in | Wt 173.0 lb

## 2022-01-13 DIAGNOSIS — N3941 Urge incontinence: Secondary | ICD-10-CM

## 2022-01-13 DIAGNOSIS — N401 Enlarged prostate with lower urinary tract symptoms: Secondary | ICD-10-CM | POA: Diagnosis not present

## 2022-01-13 DIAGNOSIS — R350 Frequency of micturition: Secondary | ICD-10-CM

## 2022-01-13 LAB — MICROSCOPIC EXAMINATION

## 2022-01-13 LAB — BLADDER SCAN AMB NON-IMAGING: Scan Result: 0

## 2022-01-13 LAB — URINALYSIS, COMPLETE
Bilirubin, UA: NEGATIVE
Ketones, UA: NEGATIVE
Leukocytes,UA: NEGATIVE
Nitrite, UA: NEGATIVE
Protein,UA: NEGATIVE
Specific Gravity, UA: 1.01 (ref 1.005–1.030)
Urobilinogen, Ur: 0.2 mg/dL (ref 0.2–1.0)
pH, UA: 5 (ref 5.0–7.5)

## 2022-01-13 NOTE — Progress Notes (Signed)
01/13/2022 1:36 PM   Cameron [name] 1940-09-17 [id number]  Referring provider: [name], MD [facility] [place],  [place] [address]  Chief Complaint  Patient presents with   Benign Prostatic Hypertrophy    Urologic history: 1.  BPH with detrusor overactivity -Long history of storage related voiding symptoms who did well for several years on anticholinergic medication. -Worsening voiding symptoms and 2017 and a daily study showed bladder outlet obstruction with detrusor overactivity. -TURP July 2018 at [facility] -Significant urge incontinence post TUR however at his last follow-up February 2019 this had significantly improved -Trospium 60 mg daily   2.  Recurrent balanitis with phimosis -Dorsal slit 08/29/2019  3.  Erectile dysfunction -PDE 5 inhibitors, VED ineffective -Not interested in pursuing intracavernosal injections or implant surgery  HPI: 81 y.o. male presents for annual follow-up.  Doing well since last visit No bothersome LUTS; IPSS 6/35 (scanned in media) Rare episodes urge incontinence Denies dysuria, gross hematuria Denies flank, abdominal or pelvic pain   PMH: Past Medical History:  Diagnosis Date   Appendicitis    Atrial fibrillation ([place]) 12/12/2013   BPH (benign prostatic hypertrophy)    CAD (coronary artery disease) 12/2015   Cath by Dr [name] reveals distal and small vessel CAD.  Medical therapy advised.   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  AHI was 20/h while supine.  Oxygen saturations dropped to 87%.  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   Rheumatoid arthritis ([place])    "hands" (08/11/2017)   Type II diabetes mellitus [facility])     Surgical History: Past Surgical History:  Procedure Laterality Date   BACK SURGERY     CARDIAC CATHETERIZATION N/A 12/28/2015   Procedure: Left Heart Cath and Coronary Angiography;  Surgeon: Belva Crome, MD;  Location: [place] [facility];  Service: Cardiovascular;  Laterality: N/A;   CATARACT EXTRACTION W/ INTRAOCULAR LENS  IMPLANT, BILATERAL Bilateral    CORONARY ANGIOPLASTY WITH STENT PLACEMENT  08/11/2017   "2 stents"   CORONARY STENT INTERVENTION N/A 08/11/2017   Procedure: CORONARY STENT INTERVENTION;  Surgeon: Troy Sine, MD;  Location: [place] [facility];  Service: Cardiovascular;  Laterality: N/A;   CYSTOSCOPY W/ STONE MANIPULATION     INGUINAL HERNIA REPAIR Left    INSERT / REPLACE / REMOVE PACEMAKER  12/09/2011   SJM Accent DR RF implanted by DR Allred for complete heart block and syncope   JOINT REPLACEMENT     LAPAROSCOPIC CHOLECYSTECTOMY     LEAD REVISION/REPAIR N/A 08/09/2020   Procedure: LEAD REVISION/REPAIR;  Surgeon: [name], MD;  Location: [place] [facility] LAB;  Service: Cardiovascular;  Laterality: N/A;   LITHOTRIPSY     LUMBAR [place]     "removed arthritis and spurs"   PERMANENT PACEMAKER INSERTION N/A 12/09/2011   Procedure: PERMANENT PACEMAKER INSERTION;  Surgeon: [name], MD;  Location: [facility] CATH LAB;  Service: Cardiovascular;  Laterality: N/A;   PPM GENERATOR CHANGEOUT N/A 08/09/2020   Procedure: PPM GENERATOR CHANGEOUT;  Surgeon: [name],  MD;  Location: [place] [facility];  Service: Cardiovascular;  Laterality: N/A;   REPLACEMENT TOTAL KNEE Right    RIGHT HEART CATH N/A 05/29/2021   Procedure: RIGHT HEART CATH;  Surgeon: Troy Sine, MD;  Location: [place] [facility] LAB;  Service: Cardiovascular;  Laterality: N/A;   RIGHT/LEFT HEART CATH AND CORONARY ANGIOGRAPHY N/A 08/11/2017   Procedure: RIGHT/LEFT HEART CATH AND CORONARY ANGIOGRAPHY;  Surgeon: Larey Dresser, MD;  Location: [place] [facility] LAB;  Service: Cardiovascular;  Laterality: N/A;   TRANSURETHRAL RESECTION OF PROSTATE  2017/2018    Home Medications:  Allergies as of 01/13/2022       Reactions   Ace Inhibitors Swelling   Tongue swelling, angioedema   Lisinopril    Other reaction(s): Lip swelling, O/E - lip swelling   Celecoxib Rash   Skin rash        Medication List        Accurate as of January 13, 2022  1:36 PM. If you have any questions, ask your nurse or doctor.          acetaminophen 500 MG tablet Commonly known as: TYLENOL Take 500 mg by mouth every 6 (six) hours as needed for moderate pain.   aspirin EC 81 MG tablet Take 81 mg by mouth daily.   clobetasol 0.05 % topical foam Commonly known as: OLUX Apply twice daily to affected areas on scalp, ears and elbow. Avoid applying to face, groin, and axilla.   diltiazem 120 MG 24 hr capsule Commonly known as: CARDIZEM CD Take 1 capsule (120 mg total) by mouth daily. Please schedule appointment for further refills   gabapentin 300 MG capsule Commonly known as: NEURONTIN Take 300 mg by mouth at bedtime.   glimepiride 2 MG tablet Commonly known as: AMARYL Take 2 mg by mouth every morning.   Jardiance 10 MG Tabs tablet Generic drug: empagliflozin Take 10 mg by mouth daily.   nebivolol 5 MG tablet Commonly known as: BYSTOLIC Take 5 mg by mouth daily.   nitroGLYCERIN 0.4 MG SL tablet Commonly known as: NITROSTAT Place 1 tablet (0.4 mg total) under the tongue every 5 (five) minutes as  needed for chest pain.   OMEGA-3 FISH OIL PO Take 1 capsule by mouth daily.   Pirfenidone 267 MG Tabs Commonly known as: Esbriet Take 2 tablets (534 mg total) by mouth 3 (three) times daily with meals. **NOTE LOW DOSE due to kidney function**   pravastatin 20 MG tablet Commonly known as: PRAVACHOL Take 1 tablet (20 mg total) by mouth every evening. *NEEDS OFFICE VISIT FOR FURTHER REFILLS*   rivaroxaban 20 MG Tabs tablet Commonly known as: Xarelto Take 1 tablet (20 mg total) by mouth daily with supper.   telmisartan 80 MG tablet Commonly known as: MICARDIS Take 80 mg by mouth daily.   terbinafine 1 % cream Commonly known as: LAMISIL Apply twice daily to buttocks and left thigh rash until clear   thyroid 90 MG tablet Commonly known as: ARMOUR Take 90 mg by mouth every morning.   Trospium Chloride 60 MG Cp24 TAKE  1 CAPSULE BY MOUTH DAILY   TURMERIC PO Take 1 capsule by mouth daily.   Vtama 1 % Crea Generic drug: Tapinarof Apply 1 application topically daily.        Allergies:  Allergies  Allergen Reactions   Ace Inhibitors Swelling    Tongue swelling, angioedema   Lisinopril     Other reaction(s): Lip swelling, O/E - lip swelling   Celecoxib Rash    Skin rash     Family History: Family History  Problem Relation Age of Onset   Pancreatic cancer Mother    Bone cancer Father    Alzheimer's disease Sister    Heart attack Paternal Uncle    Arthritis Maternal Grandfather    Alzheimer's disease Paternal Grandmother    Lung cancer Paternal Grandfather     Social History:  reports that he quit smoking about 49 years ago. His smoking use included cigarettes. He has a 5.00 pack-year smoking history. He quit smokeless tobacco use about 48 years ago.  His smokeless tobacco use included chew. He reports current alcohol use of about 1.0 standard drink of alcohol per week. He reports that he does not use drugs.   Physical Exam: BP 136/75   Pulse 82   Ht _0   (1.702 m)   Wt 173 lb (78.5 kg)   BMI 27.10 kg/m   Constitutional:  Alert and oriented, No acute distress. HEENT: [place] AT, moist mucus membranes.  Trachea midline, no masses. Cardiovascular: No clubbing, cyanosis, or edema. Skin: No rashes, bruises or suspicious lesions. Neurologic: Grossly intact, no focal deficits, moving all 4 extremities. Psychiatric: Normal mood and affect.   Assessment & Plan:    1.  BPH with detrusor overactivity Stable Continue trospium  2.  Urge incontinence Rare episodes   Abbie Sons, MD  [facility] [address], [place], [place] [address] [phone]

## 2022-01-15 ENCOUNTER — Encounter: Payer: Self-pay | Admitting: Dermatology

## 2022-01-17 ENCOUNTER — Encounter: Payer: Self-pay | Admitting: Urology

## 2022-01-20 ENCOUNTER — Telehealth: Payer: Self-pay | Admitting: Urology

## 2022-01-20 ENCOUNTER — Other Ambulatory Visit: Payer: Self-pay | Admitting: Physician Assistant

## 2022-01-20 DIAGNOSIS — R7989 Other specified abnormal findings of blood chemistry: Secondary | ICD-10-CM

## 2022-01-20 DIAGNOSIS — R3129 Other microscopic hematuria: Secondary | ICD-10-CM

## 2022-01-20 NOTE — Telephone Encounter (Signed)
Patient needs order for CT scan

## 2022-01-21 ENCOUNTER — Ambulatory Visit
Admission: RE | Admit: 2022-01-21 | Discharge: 2022-01-21 | Disposition: A | Discharge status: Home / Self Care | Payer: Medicare Other | Source: Ambulatory Visit | Attending: Physician Assistant | Admitting: Physician Assistant

## 2022-01-21 DIAGNOSIS — R7989 Other specified abnormal findings of blood chemistry: Secondary | ICD-10-CM | POA: Diagnosis present

## 2022-01-23 ENCOUNTER — Ambulatory Visit: Payer: Medicare Other | Admitting: Urology

## 2022-01-24 ENCOUNTER — Ambulatory Visit
Admission: RE | Admit: 2022-01-24 | Discharge: 2022-01-24 | Disposition: A | Discharge status: Home / Self Care | Payer: Medicare Other | Source: Ambulatory Visit | Attending: Urology | Admitting: Urology

## 2022-01-24 DIAGNOSIS — R3129 Other microscopic hematuria: Secondary | ICD-10-CM | POA: Insufficient documentation

## 2022-01-24 MED ORDER — IOHEXOL 300 MG/ML  SOLN
100.0000 mL | Freq: Once | INTRAMUSCULAR | Status: AC | PRN
  Administered 2022-01-24: 100 mL via INTRAVENOUS

## 2022-02-06 ENCOUNTER — Ambulatory Visit: Payer: Medicare Other

## 2022-02-07 ENCOUNTER — Other Ambulatory Visit (HOSPITAL_COMMUNITY): Payer: Self-pay | Admitting: Physician Assistant

## 2022-02-07 DIAGNOSIS — I4891 Unspecified atrial fibrillation: Secondary | ICD-10-CM

## 2022-02-07 NOTE — Telephone Encounter (Signed)
Prescription refill request for Xarelto received.  Indication: Afib  Last office visit: 05/09/21 Claiborne Billings)  Weight: 78.5kg Age: 81 Scr: 1.2 (01/15/22) CrCl: 53.51m/min  Appropriate dose and refill sent to requested pharmacy.

## 2022-02-09 NOTE — Progress Notes (Unsigned)
Cardiology Office Note:    Date:  02/11/2022   ID:  Cameron [name], DOB July 05, 1940, MRN [id number]  PCP:  Cameron Crouch, MD   [facility] Cardiologist:  [name], MD Cardiology APP:  Ledora Bottcher, [place]  Electrophysiologist:  Cameron Grayer, MD  Sleep Medicine:  Fransico Him, MD {.  Referring MD: Cameron Crouch, MD   Chief Complaint  Patient presents with   Follow-up    CAD    History of Present Illness:    Cameron [name] is a 81 y.o. male with a hx of atrial fibrillation on xarelto, CAD, hypothyroidism, CHB s/p PPM, OSA on CPAP, DM, hypertension, ILD, and RA.  During work-up for ILD, he was found to have aortic atherosclerosis and coronary calcifications with enlarged pulmonary arteries.  He was referred to Dr. Aundra [name] who performed a diagnostic cardiac catheterization March 2019.  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  He had severe disease in his proximal circumflex and OM1.  Dr. Claiborne [name] was able to successfully stent his bifurcation disease.  He completed 12 months of DAPT.    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  Due to progression of his ILD, he underwent repeat right heart catheterization on 05/29/21 which showed mild pulmonary hypertension with PA pressure 55/18, PW mean pressure 17 mmHg. Mild to moderate MR was also noted. He was recently seen by pulmonology 11/27/21 with PFTs showing some improvement.   Of note, labs on 09/30/21 showed hyperkalemia with K 6.1. Pt was on micardis. Has since resolved after discontinuing potassium supplement. Recent labs in July and August showed K WNL still on micardis.   He presents for follow up. He and his wife are exercising with [employer] - walking, weights, and balance exercises. Overall, no cardiac complaints. They just got [place] from a [place] that included a lot of walking. Needs medication refills.   Past  Medical History:  Diagnosis Date   Appendicitis    Atrial fibrillation ([place]) 12/12/2013   BPH (benign prostatic hypertrophy)    CAD (coronary artery disease) 12/2015   Cath by Dr Cameron [name] reveals distal and small vessel CAD.  Medical therapy advised.   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   Coronary artery disease    Coronary artery disease involving native coronary artery of native heart with unstable angina pectoris ([place]) 08/11/2017   History of blood transfusion 1968   "probably; related to getting [place]   History of kidney stones    History of SCC (squamous cell carcinoma) of skin 07/24/2020   right upper arm/excision   Hyperglycemia 11/05/2013   Hyperlipidemia 11/05/2013   Hypertension    Hypothyroidism    Hypothyroidism, unspecified 11/05/2013   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  AHI was 20/h while supine.  Oxygen saturations dropped to 87%.  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   Rheumatoid arthritis ([place])    "hands" (08/11/2017)   Type II diabetes mellitus ([place])     Past Surgical History:  Procedure Laterality Date   BACK SURGERY     CARDIAC CATHETERIZATION N/A  12/28/2015   Procedure: Left Heart Cath and Coronary Angiography;  Surgeon: Cameron Crome, MD;  Location: [place] [facility];  Service: Cardiovascular;  Laterality: N/A;   CATARACT EXTRACTION W/ INTRAOCULAR LENS  IMPLANT, BILATERAL Bilateral    CORONARY ANGIOPLASTY WITH STENT PLACEMENT  08/11/2017   "2 stents"   CORONARY STENT INTERVENTION N/A 08/11/2017   Procedure: CORONARY STENT INTERVENTION;  Surgeon: Cameron Sine, MD;  Location: [place] [facility];  Service: Cardiovascular;  Laterality: N/A;   CYSTOSCOPY W/ STONE MANIPULATION      INGUINAL HERNIA REPAIR Left    INSERT / REPLACE / REMOVE PACEMAKER  12/09/2011   SJM Accent DR RF implanted by DR Allred for complete heart block and syncope   JOINT REPLACEMENT     LAPAROSCOPIC CHOLECYSTECTOMY     LEAD REVISION/REPAIR N/A 08/09/2020   Procedure: LEAD REVISION/REPAIR;  Surgeon: Cameron Grayer, MD;  Location: [place] [facility];  Service: Cardiovascular;  Laterality: N/A;   LITHOTRIPSY     LUMBAR [place]     "removed arthritis and spurs"   PERMANENT PACEMAKER INSERTION N/A 12/09/2011   Procedure: PERMANENT PACEMAKER INSERTION;  Surgeon: Cameron Grayer, MD;  Location: [facility] CATH LAB;  Service: Cardiovascular;  Laterality: N/A;   PPM GENERATOR CHANGEOUT N/A 08/09/2020   Procedure: PPM GENERATOR CHANGEOUT;  Surgeon: Cameron Grayer, MD;  Location: [place] [facility] LAB;  Service: Cardiovascular;  Laterality: N/A;   REPLACEMENT TOTAL KNEE Right    RIGHT HEART CATH N/A 05/29/2021   Procedure: RIGHT HEART CATH;  Surgeon: Cameron Sine, MD;  Location: [place] [facility];  Service: Cardiovascular;  Laterality: N/A;   RIGHT/LEFT HEART CATH AND CORONARY ANGIOGRAPHY N/A 08/11/2017   Procedure: RIGHT/LEFT HEART CATH AND CORONARY ANGIOGRAPHY;  Surgeon: Cameron Dresser, MD;  Location: [place] [facility] LAB;  Service: Cardiovascular;  Laterality: N/A;   TRANSURETHRAL RESECTION OF PROSTATE  2017/2018    Current Medications: Current Meds  Medication Sig   acetaminophen (TYLENOL) 500 MG tablet Take 500 mg by mouth every 6 (six) hours as needed for moderate pain.   allopurinol (ZYLOPRIM) 100 MG tablet Take 200 mg by mouth daily.   clobetasol (OLUX) 0.05 % topical foam Apply twice daily to affected areas on scalp, ears and elbow. Avoid applying to face, groin, and axilla.   colchicine 0.6 MG tablet Take 0.6 mg by mouth 2 (two) times daily.   gabapentin (NEURONTIN) 300 MG capsule Take 300 mg by mouth at bedtime.   glimepiride (AMARYL) 2 MG tablet Take 2 mg by mouth every morning.   JARDIANCE 10 MG TABS  tablet Take 10 mg by mouth daily.   leflunomide (ARAVA) 10 MG tablet Take 10 mg by mouth daily.   nitroGLYCERIN (NITROSTAT) 0.4 MG SL tablet Place 1 tablet (0.4 mg total) under the tongue every 5 (five) minutes as needed for chest pain.   Omega-3 Fatty Acids (OMEGA-3 FISH OIL PO) Take 1 capsule by mouth daily.   pantoprazole (PROTONIX) 40 MG tablet Take 40 mg by mouth daily.   Pirfenidone (ESBRIET) 267 MG TABS Take 2 tablets (534 mg total) by mouth 3 (three) times daily with meals. **NOTE LOW DOSE due to kidney function**   sodium polystyrene (KAYEXALATE) powder SMARTSIG:15 Ounce(s) By Mouth Daily   Tapinarof (VTAMA) 1 % CREA Apply 1 application topically daily.   terbinafine (LAMISIL) 1 % cream Apply twice daily to buttocks and left thigh rash until clear   thyroid (ARMOUR) 90 MG tablet Take 90 mg by  mouth every morning.    Trospium Chloride 60 MG CP24 TAKE 1 CAPSULE BY MOUTH DAILY   TURMERIC PO Take 1 capsule by mouth daily.   [DISCONTINUED] aspirin 81 MG EC tablet Take 81 mg by mouth daily.   [DISCONTINUED] diltiazem (CARDIZEM CD) 120 MG 24 hr capsule Take 1 capsule (120 mg total) by mouth daily. Please schedule appointment for further refills   [DISCONTINUED] nebivolol (BYSTOLIC) 5 MG tablet Take 5 mg by mouth daily.   [DISCONTINUED] pravastatin (PRAVACHOL) 20 MG tablet Take 1 tablet (20 mg total) by mouth every evening. *NEEDS OFFICE VISIT FOR FURTHER REFILLS*   [DISCONTINUED] rivaroxaban (XARELTO) 20 MG TABS tablet TAKE 1 TABLET DAILY WITH SUPPER   [DISCONTINUED] telmisartan (MICARDIS) 80 MG tablet Take 80 mg by mouth daily.   Current Facility-Administered Medications for the 02/11/22 encounter (Office Visit) with Ledora Bottcher, PA  Medication   sodium chloride flush (NS) 0.9 % injection 3 mL     Allergies:   Ace inhibitors, Lisinopril, and Celecoxib   Social History   Socioeconomic History   Marital status: Married    Spouse name: Not on file   Number of children: Not on  file   Years of education: Not on file   Highest education level: Not on file  Occupational History   Not on file  Tobacco Use   Smoking status: Former    Packs/day: 1.00    Years: 5.00    Total pack years: 5.00    Types: Cigarettes    Quit date: 07/10/1972    Years since quitting: 49.6   Smokeless tobacco: Former    Types: Chew    Quit date: 1975  Vaping Use   Vaping Use: Never used  Substance and Sexual Activity   Alcohol use: Yes    Alcohol/week: 1.0 standard drink of alcohol    Types: 1 Glasses of wine per week   Drug use: No   Sexual activity: Not Currently  Other Topics Concern   Not on file  Social History Narrative   Not on file   Social Determinants of Health   Financial Resource Strain: Not on file  Food Insecurity: Not on file  Transportation Needs: Not on file  Physical Activity: Not on file  Stress: Not on file  Social Connections: Not on file     Family History: The patient's family history includes Alzheimer's disease in his paternal grandmother and sister; Arthritis in his maternal grandfather; Bone cancer in his father; Heart attack in his paternal uncle; Lung cancer in his paternal grandfather; Pancreatic cancer in his mother.  ROS:   Please see the history of present illness.     All other systems reviewed and are negative.  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  PVR 2.6 WU.    PW mean pressure is 17 mmHg, with V wave 24 c/w his echo documented mild - moderate mitral regurgitation.    The patient will resume his home medications commencing tomorrow including resumption of Xarelto.    EKG:  EKG is  ordered today.  The ekg ordered today demonstrates A-V pacing HR 60  Recent Labs: 05/23/2021: Platelets 185 05/29/2021: Hemoglobin 10.9; Hemoglobin  11.2 11/27/2021: ALT 19; BUN 19; Creatinine, Ser 1.31; Potassium 3.7; Sodium 141  Recent Lipid Panel No results found for: "CHOL", "TRIG", "HDL", "CHOLHDL", "  VLDL", "LDLCALC", "LDLDIRECT"   Risk Assessment/Calculations:    CHA2DS2-VASc Score = 5   This indicates a 7.2% annual risk of stroke. The patient's score is based upon: CHF History: 0 HTN History: 1 Diabetes History: 1 Stroke History: 0 Vascular Disease History: 1 Age Score: 2 Gender Score: 0      Physical Exam:    VS:  BP 128/74   Pulse 69   Ht 5' 7" (1.702 m)   Wt 171 lb 3.2 oz (77.7 kg)   SpO2 97%   BMI 26.81 kg/m     Wt Readings from Last 3 Encounters:  02/11/22 171 lb 3.2 oz (77.7 kg)  02/10/22 173 lb (78.5 kg)  01/13/22 173 lb (78.5 kg)     GEN:  Well nourished, well developed in no acute distress HEENT: Normal NECK: No JVD; No carotid bruits LYMPHATICS: No lymphadenopathy CARDIAC: RRR, no murmurs, rubs, gallops RESPIRATORY:  Clear to auscultation without rales, wheezing or rhonchi  ABDOMEN: Soft, non-tender, non-distended MUSCULOSKELETAL:  No edema; No deformity  SKIN: Warm and dry NEUROLOGIC:  Alert and oriented x 3 PSYCHIATRIC:  Normal affect   ASSESSMENT:    1. Coronary artery disease involving native coronary artery of native heart without angina pectoris   2. Hyperlipidemia with target LDL less than 70   3. Atrial fibrillation, unspecified type ([place])   4. Chronic anticoagulation   5. SSS (sick sinus syndrome) (HCC)   6. Pacemaker   7. Primary hypertension    PLAN:    In order of problems listed above:  CAD s/p DES to LCX-OM1 Hyperlipidemia with LDL goal < 70 ASA, bystolic, statin PCP follows lipids LDL 75 in April 2023 - discussed that we don't want this to get higher.  Doing well on jardiance   Atrial fibrillation Chronic anticoagulation SSS s/p PPM Maintained on bystolic and cardizem OAC with xarelto Hematuria, seen by urology, felt due to kidney stones   Hypertension   Telmisartan, bystolic, cardizem   OSA Compliant on CPAP     Refill cardiac medications. Follow up with Dr. Claiborne [name] in 1 year.    Medication Adjustments/Labs and Tests Ordered: Current medicines are reviewed at length with the patient today.  Concerns regarding medicines are outlined above.  Orders Placed This Encounter  Procedures   EKG 12-Lead   Meds ordered this encounter  Medications   diltiazem (CARDIZEM CD) 120 MG 24 hr capsule    Sig: Take 1 capsule (120 mg total) by mouth daily. Please schedule appointment for further refills    Dispense:  30 capsule    Refill:  1    FOR NEXT REFILL   aspirin EC 81 MG tablet    Sig: Take 1 tablet (81 mg total) by mouth daily.    Dispense:  30 tablet    Refill:  11   nebivolol (BYSTOLIC) 5 MG tablet    Sig: Take 1 tablet (5 mg total) by mouth daily.    Dispense:  90 tablet    Refill:  3   pravastatin (PRAVACHOL) 20 MG tablet    Sig: Take 1 tablet (20 mg total) by mouth every evening.    Dispense:  90 tablet    Refill:  3   rivaroxaban (XARELTO) 20 MG TABS tablet    Sig: TAKE 1 TABLET DAILY WITH SUPPER    Dispense:  90 tablet    Refill:  3    90 day supply   telmisartan (MICARDIS) 80 MG tablet    Sig: Take  1 tablet (80 mg total) by mouth daily.    Dispense:  90 tablet    Refill:  3    Patient Instructions  Medication Instructions:  Your physician recommends that you continue on your current medications as directed. Please refer to the Current Medication list given to you today.  *If you need a refill on your cardiac medications before your next appointment, please call your pharmacy*  LDL Goal is Less than 70    Lab Work: NONE ordered at this time of appointment   If you have labs (blood work) drawn today and your tests are completely normal, you will receive your results only by: [place] (if you have MyChart) OR A paper copy in the mail If you have any lab test that is abnormal or we need to change your  treatment, we will call you to review the results.   Testing/Procedures: NONE ordered at this time of appointment     Follow-Up: At [facility], you and your health needs are our priority.  As part of our continuing mission to provide you with exceptional heart care, we have created designated Provider Care Teams.  These Care Teams include your primary Cardiologist (physician) and Advanced Practice Providers (APPs -  Physician Assistants and Nurse Practitioners) who all work together to provide you with the care you need, when you need it.  We recommend signing up for the patient portal called "MyChart".  Sign up information is provided on this After Visit Summary.  MyChart is used to connect with patients for Virtual Visits (Telemedicine).  Patients are able to view lab/test results, encounter notes, upcoming appointments, etc.  Non-urgent messages can be sent to your provider as well.   To learn more about what you can do with MyChart, go to [email or web address].    Your next appointment:   1 year(s)  The format for your next appointment:   In Person  Provider:   [name], MD      Signed, Tami Lin T[place]an [place], [place]  02/11/2022 11:12 AM    [place]

## 2022-02-10 ENCOUNTER — Other Ambulatory Visit: Payer: Self-pay | Admitting: Internal Medicine

## 2022-02-10 ENCOUNTER — Encounter: Payer: Self-pay | Admitting: Urology

## 2022-02-10 ENCOUNTER — Ambulatory Visit (INDEPENDENT_AMBULATORY_CARE_PROVIDER_SITE_OTHER): Payer: Medicare Other | Admitting: Urology

## 2022-02-10 VITALS — BP 125/73 | HR 89 | Ht 67.0 in | Wt 173.0 lb

## 2022-02-10 DIAGNOSIS — R3129 Other microscopic hematuria: Secondary | ICD-10-CM

## 2022-02-10 DIAGNOSIS — N200 Calculus of kidney: Secondary | ICD-10-CM

## 2022-02-11 ENCOUNTER — Encounter: Payer: Self-pay | Admitting: Physician Assistant

## 2022-02-11 ENCOUNTER — Ambulatory Visit: Payer: Medicare Other | Attending: Cardiovascular Disease | Admitting: Physician Assistant

## 2022-02-11 VITALS — BP 128/74 | HR 69 | Ht 67.0 in | Wt 171.2 lb

## 2022-02-11 DIAGNOSIS — Z7901 Long term (current) use of anticoagulants: Secondary | ICD-10-CM | POA: Diagnosis not present

## 2022-02-11 DIAGNOSIS — I2510 Atherosclerotic heart disease of native coronary artery without angina pectoris: Secondary | ICD-10-CM | POA: Diagnosis not present

## 2022-02-11 DIAGNOSIS — I4891 Unspecified atrial fibrillation: Secondary | ICD-10-CM | POA: Diagnosis not present

## 2022-02-11 DIAGNOSIS — Z950 Presence of cardiac pacemaker: Secondary | ICD-10-CM | POA: Diagnosis present

## 2022-02-11 DIAGNOSIS — I495 Sick sinus syndrome: Secondary | ICD-10-CM | POA: Insufficient documentation

## 2022-02-11 DIAGNOSIS — I10 Essential (primary) hypertension: Secondary | ICD-10-CM | POA: Insufficient documentation

## 2022-02-11 DIAGNOSIS — E785 Hyperlipidemia, unspecified: Secondary | ICD-10-CM | POA: Insufficient documentation

## 2022-02-11 LAB — CUP PACEART REMOTE DEVICE CHECK
Battery Remaining Longevity: 94 mo
Battery Remaining Percentage: 83 %
Battery Voltage: 3.01 V
Brady Statistic AP VP Percent: 97 %
Brady Statistic AP VS Percent: 1 %
Brady Statistic AS VP Percent: 3 %
Brady Statistic AS VS Percent: 1 %
Brady Statistic RA Percent Paced: 96 %
Brady Statistic RV Percent Paced: 99 %
Date Time Interrogation Session: 20230918010038
Implantable Lead Implant Date: 20130716
Implantable Lead Implant Date: 20220317
Implantable Lead Location: 753859
Implantable Lead Location: 753860
Implantable Lead Model: 1948
Implantable Lead Model: 5076
Implantable Pulse Generator Implant Date: 20220317
Lead Channel Impedance Value: 410 Ohm
Lead Channel Impedance Value: 600 Ohm
Lead Channel Pacing Threshold Amplitude: 0.25 V
Lead Channel Pacing Threshold Amplitude: 0.5 V
Lead Channel Pacing Threshold Pulse Width: 0.5 ms
Lead Channel Pacing Threshold Pulse Width: 0.5 ms
Lead Channel Sensing Intrinsic Amplitude: 12 mV
Lead Channel Sensing Intrinsic Amplitude: 2.9 mV
Lead Channel Setting Pacing Amplitude: 0.5 V
Lead Channel Setting Pacing Amplitude: 2 V
Lead Channel Setting Pacing Pulse Width: 0.5 ms
Lead Channel Setting Sensing Sensitivity: 7 mV
Pulse Gen Model: 2272
Pulse Gen Serial Number: 3908614

## 2022-02-11 LAB — MICROSCOPIC EXAMINATION
Bacteria, UA: NONE SEEN
RBC, Urine: NONE SEEN /hpf (ref 0–2)

## 2022-02-11 LAB — URINALYSIS, COMPLETE
Bilirubin, UA: NEGATIVE
Ketones, UA: NEGATIVE
Leukocytes,UA: NEGATIVE
Nitrite, UA: NEGATIVE
Protein,UA: NEGATIVE
RBC, UA: NEGATIVE
Specific Gravity, UA: 1.01 (ref 1.005–1.030)
Urobilinogen, Ur: 0.2 mg/dL (ref 0.2–1.0)
pH, UA: 5 (ref 5.0–7.5)

## 2022-02-11 MED ORDER — RIVAROXABAN 20 MG PO TABS: ORAL_TABLET | ORAL | 3 refills | Status: DC

## 2022-02-11 MED ORDER — TELMISARTAN 80 MG PO TABS: 80.0000 mg | ORAL_TABLET | Freq: Every day | ORAL | 3 refills | Status: DC

## 2022-02-11 MED ORDER — PRAVASTATIN SODIUM 20 MG PO TABS: 20.0000 mg | ORAL_TABLET | Freq: Every evening | ORAL | 3 refills | Status: DC

## 2022-02-11 MED ORDER — DILTIAZEM HCL ER COATED BEADS 120 MG PO CP24: 120.0000 mg | ORAL_CAPSULE | Freq: Every day | ORAL | 1 refills | Status: DC

## 2022-02-11 MED ORDER — NEBIVOLOL HCL 5 MG PO TABS: 5.0000 mg | ORAL_TABLET | Freq: Every day | ORAL | 3 refills | Status: DC

## 2022-02-11 MED ORDER — ASPIRIN 81 MG PO TBEC: 81.0000 mg | DELAYED_RELEASE_TABLET | Freq: Every day | ORAL | 11 refills | Status: DC

## 2022-02-11 NOTE — Patient Instructions (Signed)
Medication Instructions:  Your physician recommends that you continue on your current medications as directed. Please refer to the Current Medication list given to you today.  *If you need a refill on your cardiac medications before your next appointment, please call your pharmacy*  LDL Goal is Less than 70    Lab Work: NONE ordered at this time of appointment   If you have labs (blood work) drawn today and your tests are completely normal, you will receive your results only by: [place] (if you have MyChart) OR A paper copy in the mail If you have any lab test that is abnormal or we need to change your treatment, we will call you to review the results.   Testing/Procedures: NONE ordered at this time of appointment     Follow-Up: At [facility], you and your health needs are our priority.  As part of our continuing mission to provide you with exceptional heart care, we have created designated Provider Care Teams.  These Care Teams include your primary Cardiologist (physician) and Advanced Practice Providers (APPs -  Physician Assistants and Nurse Practitioners) who all work together to provide you with the care you need, when you need it.  We recommend signing up for the patient portal called "MyChart".  Sign up information is provided on this After Visit Summary.  MyChart is used to connect with patients for Virtual Visits (Telemedicine).  Patients are able to view lab/test results, encounter notes, upcoming appointments, etc.  Non-urgent messages can be sent to your provider as well.   To learn more about what you can do with MyChart, go to [email or web address].    Your next appointment:   1 year(s)  The format for your next appointment:   In Person  Provider:   [name], MD

## 2022-02-12 ENCOUNTER — Encounter: Payer: Self-pay | Admitting: Urology

## 2022-02-12 ENCOUNTER — Telehealth: Payer: Self-pay

## 2022-02-12 NOTE — Progress Notes (Unsigned)
Electrophysiology Office Note Date: 02/19/2022  ID:  Cameron [name], DOB 07-14-1940, MRN [id number]  PCP: [name], MD Primary Cardiologist: [name], MD Electrophysiologist: [name], MD -> Dr. Myles Gip  CC: Pacemaker follow-up  Cameron [name] is a 81 y.o. male seen today for [name], MD for routine electrophysiology followup. Since last being seen in our clinic the patient reports doing very well.  he denies chest pain, palpitations, dyspnea, PND, orthopnea, nausea, vomiting, dizziness, syncope, edema, weight gain, or early satiety.   Device History: StMudlogger PPM implanted 2013 , gen change A lead 2022 for CHB  Past Medical History:  Diagnosis Date   Appendicitis    Atrial fibrillation ([place]) 12/12/2013   BPH (benign prostatic hypertrophy)    CAD (coronary artery disease) 12/2015   Cath by Dr [name] reveals distal and small vessel CAD.  Medical therapy advised.   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   Coronary artery disease    Coronary artery disease involving native coronary artery of native heart with unstable angina pectoris ([place]) 08/11/2017   History of blood transfusion 1968   "probably; related to getting [place]   History of kidney stones    History of SCC (squamous cell carcinoma) of skin 07/24/2020   right upper arm/excision   Hyperglycemia 11/05/2013   Hyperlipidemia 11/05/2013   Hypertension    Hypothyroidism    Hypothyroidism, unspecified 11/05/2013   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  AHI was 20/h while supine.  Oxygen saturations dropped to 87%.  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   Rheumatoid arthritis ([place])    "hands" (08/11/2017)   Type II  diabetes mellitus ([place])    Past Surgical History:  Procedure Laterality Date   BACK SURGERY     CARDIAC CATHETERIZATION N/A 12/28/2015   Procedure: Left Heart Cath and Coronary Angiography;  Surgeon: Belva Crome, MD;  Location: [facility];  Service: Cardiovascular;  Laterality: N/A;   CATARACT EXTRACTION W/ INTRAOCULAR LENS  IMPLANT, BILATERAL Bilateral    CORONARY ANGIOPLASTY WITH STENT PLACEMENT  08/11/2017   "2 stents"   CORONARY STENT INTERVENTION N/A 08/11/2017   Procedure: CORONARY STENT INTERVENTION;  Surgeon: Troy Sine, MD;  Location: [facility];  Service: Cardiovascular;  Laterality: N/A;   CYSTOSCOPY W/ STONE MANIPULATION     INGUINAL HERNIA REPAIR Left    INSERT / REPLACE / REMOVE PACEMAKER  12/09/2011   SJM Accent DR RF implanted by DR Allred for complete heart block and syncope   JOINT REPLACEMENT     LAPAROSCOPIC CHOLECYSTECTOMY     LEAD REVISION/REPAIR N/A 08/09/2020   Procedure: LEAD REVISION/REPAIR;  Surgeon: [name], MD;  Location: [place] [facility];  Service: Cardiovascular;  Laterality: N/A;   LITHOTRIPSY     LUMBAR [place]     "removed arthritis and spurs"   PERMANENT PACEMAKER INSERTION N/A 12/09/2011   Procedure: PERMANENT PACEMAKER INSERTION;  Surgeon: [name], MD;  Location: [facility];  Service: Cardiovascular;  Laterality: N/A;   PPM GENERATOR CHANGEOUT N/A 08/09/2020   Procedure: PPM GENERATOR CHANGEOUT;  Surgeon: [name], MD;  Location: [place] [facility];  Service: Cardiovascular;  Laterality: N/A;   REPLACEMENT TOTAL KNEE Right    RIGHT HEART CATH N/A 05/29/2021   Procedure: RIGHT HEART CATH;  Surgeon: Troy Sine, MD;  Location: [place] [facility];  Service: Cardiovascular;  Laterality: N/A;   RIGHT/LEFT HEART CATH AND CORONARY ANGIOGRAPHY N/A 08/11/2017   Procedure: RIGHT/LEFT HEART CATH AND CORONARY ANGIOGRAPHY;  Surgeon: Larey Dresser, MD;  Location: [place] [facility];  Service: Cardiovascular;   Laterality: N/A;   TRANSURETHRAL RESECTION OF PROSTATE  2017/2018    Current Outpatient Medications  Medication Sig Dispense Refill   acetaminophen (TYLENOL) 500 MG tablet Take 500 mg by mouth every 6 (six) hours as needed for moderate pain.     allopurinol (ZYLOPRIM) 100 MG tablet Take 200 mg by mouth daily.     aspirin EC 81 MG tablet Take 1 tablet (81 mg total) by mouth daily. 30 tablet 11   clobetasol (OLUX) 0.05 % topical foam Apply twice daily to affected areas on scalp, ears and elbow. Avoid applying to face, groin, and axilla. 50 g 2   colchicine 0.6 MG tablet Take 0.6 mg by mouth 2 (two) times daily.     diltiazem (CARDIZEM CD) 120 MG 24 hr capsule Take 1 capsule (120 mg total) by mouth daily. Please schedule appointment for further refills 30 capsule 1   gabapentin (NEURONTIN) 300 MG capsule Take 300 mg by mouth at bedtime.     glimepiride (AMARYL) 2 MG tablet Take 2 mg by mouth every morning.     JARDIANCE 10 MG TABS tablet Take 10 mg by mouth daily.     leflunomide (ARAVA) 10 MG tablet Take 10 mg by mouth daily.     nebivolol (BYSTOLIC) 5 MG tablet Take 1 tablet (5 mg total) by mouth daily. 90 tablet 3   nitroGLYCERIN (NITROSTAT) 0.4 MG SL tablet Place 1 tablet (0.4 mg total) under the tongue every 5 (five) minutes as needed for chest pain. 75 tablet 2   Omega-3 Fatty Acids (OMEGA-3 FISH OIL PO) Take 1 capsule by mouth daily.     pantoprazole (PROTONIX) 40 MG tablet Take 40 mg by mouth daily.     Pirfenidone (ESBRIET) 267 MG TABS Take 2 tablets (534 mg total) by mouth 3 (three) times daily with meals. **NOTE LOW DOSE due to kidney function** 180 tablet 5   pravastatin (PRAVACHOL) 20 MG tablet Take 1 tablet (20 mg total) by mouth every evening. 90 tablet 3   rivaroxaban (XARELTO) 20 MG TABS tablet TAKE 1 TABLET DAILY WITH SUPPER 90 tablet 3   sodium polystyrene (KAYEXALATE) powder SMARTSIG:15 Ounce(s) By Mouth Daily     Tapinarof (VTAMA) 1 % CREA Apply 1 application topically  daily. 60 g 0   telmisartan (MICARDIS) 80 MG tablet Take 1 tablet (80 mg total) by mouth daily. 90 tablet 3   terbinafine (LAMISIL) 1 % cream Apply twice daily to buttocks and left thigh rash until clear 42 g 2   thyroid (ARMOUR) 90 MG tablet Take 90 mg by mouth every morning.      triamcinolone ointment (KENALOG) 0.1 % Apply twice daily to arms up to 2 weeks as needed. Avoid applying to face, groin, and axilla. 80 g 2   Trospium Chloride 60 MG CP24 TAKE 1 CAPSULE BY MOUTH DAILY 30 capsule 2   TURMERIC PO Take 1 capsule by mouth daily.     Current Facility-Administered Medications  Medication Dose Route Frequency Provider  Last Rate Last Admin   sodium chloride flush (NS) 0.9 % injection 3 mL  3 mL Intravenous Q12H Troy Sine, MD       Facility-Administered Medications Ordered in Other Visits  Medication Dose Route Frequency Provider Last Rate Last Admin   ondansetron (ZOFRAN) 4 mg in sodium chloride 0.9 % 50 mL IVPB  4 mg Intravenous Q6H PRN Ashok Pall, MD        Allergies:   Ace inhibitors, Lisinopril, and Celecoxib   Social History: Social History   Socioeconomic History   Marital status: Married    Spouse name: Not on file   Number of children: Not on file   Years of education: Not on file   Highest education level: Not on file  Occupational History   Not on file  Tobacco Use   Smoking status: Former    Packs/day: 1.00    Years: 5.00    Total pack years: 5.00    Types: Cigarettes    Quit date: 07/10/1972    Years since quitting: 49.6   Smokeless tobacco: Former    Types: Chew    Quit date: 1975  Vaping Use   Vaping Use: Never used  Substance and Sexual Activity   Alcohol use: Yes    Alcohol/week: 1.0 standard drink of alcohol    Types: 1 Glasses of wine per week   Drug use: No   Sexual activity: Not Currently  Other Topics Concern   Not on file  Social History Narrative   Not on file   Social Determinants of Health   Financial Resource Strain: Not on  file  Food Insecurity: Not on file  Transportation Needs: Not on file  Physical Activity: Not on file  Stress: Not on file  Social Connections: Not on file  Intimate Partner Violence: Not on file    Family History: Family History  Problem Relation Age of Onset   Pancreatic cancer Mother    Bone cancer Father    Alzheimer's disease Sister    Heart attack Paternal Uncle    Arthritis Maternal Grandfather    Alzheimer's disease Paternal Grandmother    Lung cancer Paternal Grandfather      Review of Systems: All other systems reviewed and are otherwise negative except as noted above.  Physical Exam: Vitals:   02/19/22 1207  BP: 118/68  Pulse: 71  SpO2: 97%  Weight: 173 lb (78.5 kg)  Height: _0  (1.702 m)     GEN- The patient is well appearing, alert and oriented x 3 today.   HEENT: normocephalic, atraumatic; sclera clear, conjunctiva pink; hearing intact; oropharynx clear; neck supple, no JVP Lymph- no cervical lymphadenopathy Lungs- Clear to ausculation bilaterally, normal work of breathing.  No wheezes, rales, rhonchi Heart- Regular  rate and rhythm, no murmurs, rubs or gallops, PMI not laterally displaced GI- soft, non-tender, non-distended, bowel sounds present, no hepatosplenomegaly Extremities- no clubbing or cyanosis. No peripheral edema; DP/PT/radial pulses 2+ bilaterally MS- no significant deformity or atrophy Skin- warm and dry, no rash or lesion; PPM pocket well healed Psych- euthymic mood, full affect Neuro- strength and sensation are intact  PPM Interrogation-  reviewed in detail today,  See PACEART report.  EKG:  EKG is not ordered today. Personal review of ekg ordered  02/11/2022  shows AV dual pacing at 60 bpm   Recent Labs: 05/23/2021: Platelets 185 05/29/2021: Hemoglobin 10.9; Hemoglobin 11.2 11/27/2021: ALT 19; BUN 19; Creatinine, Ser 1.31; Potassium 3.7; Sodium 141   Wt  Readings from Last 3 Encounters:  02/19/22 173 lb (78.5 kg)  02/11/22 171 lb  3.2 oz (77.7 kg)  02/10/22 173 lb (78.5 kg)     Other studies Reviewed: Additional studies/ records that were reviewed today include: Previous EP office notes, Previous remote checks, Most recent labwork.   Assessment and Plan:  1. CHB s/p St. Jude PPM  Normal PPM function See Pace Art report No changes today  2. Persistent Atrial Fibrillation  NSR by device. Burden <1% by device.  Continue Xarelto for CHA2DS2VASC of at least 5   3. HTN Stable on current regimen   4. CAD No s/s ischemia   5. 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  PVR 2.6 WU. PW mean pressure is 17 mmHg, with V wave 24 c/w his echo documented mild - moderate mitral regurgitation.   Current medicines are reviewed at length with the patient today.    Labs/ tests ordered today include:  Orders Placed This Encounter  Procedures   CUP PACEART INCLINIC DEVICE CHECK   Disposition:   Follow up with Dr. Myles Gip in 12 months    Signed, Shirley Friar, PA-C  02/19/2022 12:16 PM  [place] [address] [phone] (office) [phone] (fax)

## 2022-02-12 NOTE — Progress Notes (Signed)
02/12/22  CC:  Chief Complaint  Patient presents with   Cysto    HPI: Noted on recent office visit to have microhematuria at 3-10 RBC.  CTU performed 01/24/2022 showed bilateral, nonobstructing renal calculi.  Largest left upper pole measuring 9 mm.  Blood pressure 125/73, pulse 89, height 5' 7" (1.702 m), weight 173 lb (78.5 kg). NED. A&Ox3.     Cystoscopy Procedure Note  Patient identification was confirmed, informed consent was obtained, and patient was prepped using Betadine solution.  Lidocaine jelly was administered per urethral meatus.     Pre-Procedure: - Inspection reveals a normal caliber urethral meatus.  Procedure: The flexible cystoscope was introduced without difficulty - No urethral strictures/lesions are present. 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

## 2022-02-12 NOTE — Telephone Encounter (Signed)
Received faxed PA form from [place] regarding Pirfenidone. Completed form and faxed back with supporting documentation. Will await response.  Fax# [phone] Phone# [phone]

## 2022-02-14 ENCOUNTER — Other Ambulatory Visit (HOSPITAL_COMMUNITY): Payer: Self-pay

## 2022-02-14 NOTE — Telephone Encounter (Signed)
Received notification from [employer] regarding a prior authorization for PIRFENIDONE. Authorization has been APPROVED from 02/12/22 to 02/11/22.   Per test claim, copay for 30 days supply is $10  Patient can continue to fill through  [place], PharmD, MPH, BCPS, CPP Clinical Pharmacist (Rheumatology and Pulmonology)

## 2022-02-18 ENCOUNTER — Encounter: Payer: Self-pay | Admitting: Dermatology

## 2022-02-18 ENCOUNTER — Ambulatory Visit (INDEPENDENT_AMBULATORY_CARE_PROVIDER_SITE_OTHER): Payer: Medicare Other | Admitting: Dermatology

## 2022-02-18 DIAGNOSIS — R21 Rash and other nonspecific skin eruption: Secondary | ICD-10-CM

## 2022-02-18 DIAGNOSIS — B354 Tinea corporis: Secondary | ICD-10-CM | POA: Diagnosis not present

## 2022-02-18 MED ORDER — TRIAMCINOLONE ACETONIDE 0.1 % EX OINT: TOPICAL_OINTMENT | CUTANEOUS | 2 refills | Status: DC

## 2022-02-18 NOTE — Patient Instructions (Signed)
Use OTC hydrocortisone cream twice daily 1-2 weeks for areas on face as needed.   Start Triamcinolone 0.1% ointment twice daily to arms up to 2 weeks as needed. Avoid applying to face, groin, and axilla. Use as directed. Long-term use can cause thinning of the skin.  Topical steroids (such as triamcinolone, fluocinolone, fluocinonide, mometasone, clobetasol, halobetasol, betamethasone, hydrocortisone) can cause thinning and lightening of the skin if they are used for too long in the same area. Your physician has selected the right strength medicine for your problem and area affected on the body. Please use your medication only as directed by your physician to prevent side effects.    Recommend daily broad spectrum sunscreen SPF 30+ to sun-exposed areas, reapply every 2 hours as needed. Call for new or changing lesions.  Staying in the shade or wearing long sleeves, sun glasses (UVA+UVB protection) and wide brim hats (4-inch brim around the entire circumference of the hat) are also recommended for sun protection.    Due to recent changes in healthcare laws, you may see results of your pathology and/or laboratory studies on MyChart before the doctors have had a chance to review them. We understand that in some cases there may be results that are confusing or concerning to you. Please understand that not all results are received at the same time and often the doctors may need to interpret multiple results in order to provide you with the best plan of care or course of treatment. Therefore, we ask that you please give [place] 2 business days to thoroughly review all your results before contacting the office for clarification. Should we see a critical lab result, you will be contacted sooner.   If You Need Anything After Your Visit  If you have any questions or concerns for your doctor, please call our main line at [phone] and press option 4 to reach your doctor's medical assistant. If no one answers,  please leave a voicemail as directed and we will return your call as soon as possible. Messages left after 4 pm will be answered the following business day.   You may also send [place] a message via [place]. We typically respond to MyChart messages within 1-2 business days.  For prescription refills, please ask your pharmacy to contact our office. Our fax number is [phone].  If you have an urgent issue when the clinic is closed that cannot wait until the next business day, you can page your doctor at the number below.    Please note that while we do our best to be available for urgent issues outside of office hours, we are not available 24/7.   If you have an urgent issue and are unable to reach [place], you may choose to seek medical care at your doctor's office, retail clinic, urgent care center, or emergency room.  If you have a medical emergency, please immediately call 911 or go to the emergency department.  Pager Numbers  - Dr. Nehemiah Massed: [phone]  - Dr. Laurence Ferrari: [phone]  - Dr. Nicole Kindred: [phone]  In the event of inclement weather, please call our main line at [phone] for an update on the status of any delays or closures.  Dermatology Medication Tips: Please keep the boxes that topical medications come in in order to help keep track of the instructions about where and how to use these. Pharmacies typically print the medication instructions only on the boxes and not directly on the medication tubes.   If your medication is too expensive, please  contact our office at [phone] option 4 or send [place] a message through [place].   We are unable to tell what your co-pay for medications will be in advance as this is different depending on your insurance coverage. However, we may be able to find a substitute medication at lower cost or fill out paperwork to get insurance to cover a needed medication.   If a prior authorization is required to get your medication covered by your  insurance company, please allow [place] 1-2 business days to complete this process.  Drug prices often vary depending on where the prescription is filled and some pharmacies may offer cheaper prices.  The website www.goodrx.com contains coupons for medications through different pharmacies. The prices here do not account for what the cost may be with help from insurance (it may be cheaper with your insurance), but the website can give you the price if you did not use any insurance.  - You can print the associated coupon and take it with your prescription to the pharmacy.  - You may also stop by our office during regular business hours and pick up a GoodRx coupon card.  - If you need your prescription sent electronically to a different pharmacy, notify our office through [facility] or by phone at [phone] option 4.     Si Usted Necesita Algo Despus de Su Visita  Tambin puede enviarnos un mensaje a travs de Pharmacist, community. Por lo general respondemos a los mensajes de MyChart en el transcurso de 1 a 2 das hbiles.  Para renovar recetas, por favor pida a su farmacia que se ponga en contacto con nuestra oficina. Harland Dingwall de fax es [place] [phone].  Si tiene un asunto urgente cuando la clnica est cerrada y que no puede esperar hasta el siguiente da hbil, puede llamar/localizar a su doctor(a) al nmero que aparece a continuacin.   Por favor, tenga en cuenta que aunque hacemos todo lo posible para estar disponibles para asuntos urgentes fuera del horario de [place], no estamos disponibles las 24 horas del da, los 7 das de la [place].   Si tiene un problema urgente y no puede comunicarse con nosotros, puede optar por buscar atencin mdica  en el consultorio de su doctor(a), en una clnica privada, en un centro de atencin urgente o en una sala de emergencias.  Si tiene Engineering geologist, por favor llame inmediatamente al 911 o vaya a la sala de emergencias.  Nmeros de  bper  - Dr. Nehemiah Massed: [phone]  - Dra. Moye: [phone]  - Dra. Nicole Kindred: [phone]  En caso de inclemencias del [place], por favor llame a Johnsie Kindred principal al [phone] para una actualizacin sobre el [place] de cualquier retraso o cierre.  Consejos para la medicacin en dermatologa: Por favor, guarde las cajas en las que vienen los medicamentos de uso tpico para ayudarle a seguir las instrucciones sobre dnde y cmo usarlos. Las farmacias generalmente imprimen las instrucciones del medicamento slo en las cajas y no directamente en los tubos del [place].   Si su medicamento es muy caro, por favor, pngase en contacto con Zigmund Daniel llamando al [phone] y presione la opcin 4 o envenos un mensaje a travs de Pharmacist, community.   No podemos decirle cul ser su copago por los medicamentos por adelantado ya que esto es diferente dependiendo de la cobertura de su seguro. Sin embargo, es posible que podamos encontrar un medicamento sustituto a Electrical engineer un formulario para que el seguro [place]  medicamento que se considera necesario.   Si se requiere una autorizacin previa para que su compaa de seguros [place] su medicamento, por favor permtanos de 1 a 2 das hbiles para completar este proceso.  Los precios de los medicamentos varan con frecuencia dependiendo del Environmental consultant de dnde se surte la receta y alguna farmacias pueden ofrecer precios ms baratos.  El sitio web www.goodrx.com tiene cupones para medicamentos de Airline pilot. Los precios aqu no tienen en cuenta lo que podra costar con la ayuda del seguro (puede ser ms barato con su seguro), pero el sitio web puede darle el precio si no utiliz Research scientist (physical sciences).  - Puede imprimir el cupn correspondiente y llevarlo con su receta a la farmacia.  - Tambin puede pasar por nuestra oficina durante el horario de atencin regular y Charity fundraiser una tarjeta de cupones de GoodRx.  - Si necesita que su receta se  enve electrnicamente a una farmacia diferente, informe a nuestra oficina a travs de MyChart de [place] o por telfono llamando al [phone] y presione la opcin 4.

## 2022-02-18 NOTE — Progress Notes (Signed)
   Follow-Up Visit   Subjective  Cameron [name] is a 81 y.o. male who presents for the following: Rash (Left buttock, left thigh. 6 week recheck. Has been using Terbinafine cream as directed. States rash is much better).  The following portions of the chart were reviewed this encounter and updated as appropriate:  Tobacco  Allergies  Meds  Problems  Med Hx  Surg Hx  Fam Hx      Review of Systems: No other skin or systemic complaints except as noted in HPI or Assessment and Plan.   Objective  Well appearing patient in no apparent distress; mood and affect are within normal limits.  A focused examination was performed including buttocks. Relevant physical exam findings are noted in the Assessment and Plan.  forearms Scaly pink papules  buttocks, posterior thighs Normal appearing skin today   Assessment & Plan  Rash and other nonspecific skin eruption forearms  Ddx includes photoallergic, allergic contact dermatitis, hypersensitivity. Improving. No change in medications. Minimal sun exposure/only in cloudy weather.  Use OTC hydrocortisone cream twice daily 1-2 weeks for areas on face as needed.   Start Triamcinolone 0.1% ointment twice daily to arms up to 2 weeks as needed. Avoid applying to face, groin, and axilla. Use as directed. Long-term use can cause thinning of the skin.  Topical steroids (such as triamcinolone, fluocinolone, fluocinonide, mometasone, clobetasol, halobetasol, betamethasone, hydrocortisone) can cause thinning and lightening of the skin if they are used for too long in the same area. Your physician has selected the right strength medicine for your problem and area affected on the body. Please use your medication only as directed by your physician to prevent side effects.   If not clear or if recurs, call for re-evaluation.   RTC PRN flares.  triamcinolone ointment (KENALOG) 0.1 % - forearms Apply twice daily to arms up to 2 weeks as needed. Avoid  applying to face, groin, and axilla.  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 Currently well-controlled.   Resume Terbinafine cream twice daily as needed for flares.   Clear today, no evidence of mycosis fungoides   Return for TBSE in 11 months.  I, [name], CMA, am acting as scribe for Forest Gleason, MD.  Documentation: I have reviewed the above documentation for accuracy and completeness, and I agree with the above.  Forest Gleason, MD

## 2022-02-19 ENCOUNTER — Encounter: Payer: Self-pay | Admitting: Student

## 2022-02-19 ENCOUNTER — Ambulatory Visit: Payer: Medicare Other | Attending: Student | Admitting: Student

## 2022-02-19 VITALS — BP 118/68 | HR 71 | Ht 67.0 in | Wt 173.0 lb

## 2022-02-19 DIAGNOSIS — I272 Pulmonary hypertension, unspecified: Secondary | ICD-10-CM

## 2022-02-19 DIAGNOSIS — I442 Atrioventricular block, complete: Secondary | ICD-10-CM

## 2022-02-19 DIAGNOSIS — I495 Sick sinus syndrome: Secondary | ICD-10-CM

## 2022-02-19 DIAGNOSIS — I2510 Atherosclerotic heart disease of native coronary artery without angina pectoris: Secondary | ICD-10-CM | POA: Diagnosis present

## 2022-02-19 DIAGNOSIS — I480 Paroxysmal atrial fibrillation: Secondary | ICD-10-CM

## 2022-02-19 LAB — CUP PACEART INCLINIC DEVICE CHECK
Battery Remaining Longevity: 86 mo
Battery Voltage: 3.01 V
Brady Statistic RA Percent Paced: 96 %
Brady Statistic RV Percent Paced: 99.63 %
Date Time Interrogation Session: 20230927124456
Implantable Lead Implant Date: 20130716
Implantable Lead Implant Date: 20220317
Implantable Lead Location: 753859
Implantable Lead Location: 753860
Implantable Lead Model: 1948
Implantable Lead Model: 5076
Implantable Pulse Generator Implant Date: 20220317
Lead Channel Impedance Value: 400 Ohm
Lead Channel Impedance Value: 587.5 Ohm
Lead Channel Pacing Threshold Amplitude: 0.25 V
Lead Channel Pacing Threshold Amplitude: 0.5 V
Lead Channel Pacing Threshold Amplitude: 0.5 V
Lead Channel Pacing Threshold Pulse Width: 0.5 ms
Lead Channel Pacing Threshold Pulse Width: 0.5 ms
Lead Channel Pacing Threshold Pulse Width: 0.5 ms
Lead Channel Sensing Intrinsic Amplitude: 1.9 mV
Lead Channel Sensing Intrinsic Amplitude: 12 mV
Lead Channel Setting Pacing Amplitude: 0.5 V
Lead Channel Setting Pacing Amplitude: 2 V
Lead Channel Setting Pacing Pulse Width: 0.5 ms
Lead Channel Setting Sensing Sensitivity: 7 mV
Pulse Gen Model: 2272
Pulse Gen Serial Number: 3908614

## 2022-03-04 ENCOUNTER — Ambulatory Visit: Admission: RE | Admit: 2022-03-04 | Payer: Medicare Other | Source: Home / Self Care

## 2022-03-04 ENCOUNTER — Encounter: Admission: RE | Payer: Self-pay | Source: Home / Self Care

## 2022-03-04 SURGERY — COLONOSCOPY WITH PROPOFOL
Anesthesia: General

## 2022-03-11 ENCOUNTER — Ambulatory Visit: Payer: Medicare Other | Admitting: Internal Medicine

## 2022-03-19 ENCOUNTER — Encounter: Payer: Medicare Other | Admitting: Internal Medicine

## 2022-03-19 DIAGNOSIS — Z006 Encounter for examination for normal comparison and control in clinical research program: Secondary | ICD-10-CM

## 2022-03-19 DIAGNOSIS — J849 Interstitial pulmonary disease, unspecified: Secondary | ICD-10-CM

## 2022-03-19 NOTE — Research (Signed)
Title: Chronic Fibrosing Interstitial Lung Disease with Progressive Phenotype Prospective Outcomes (ILD-PRO) Registry    Protocol #: IPF-PRO-SUB, Clinical Trials # [id number], Sponsor: [employer]   Protocol Version Amendment 4 dated 12Sep2019  and confirmed current on  Consent Version for today's visit date of  Is [place] [facility] Approved Version 29 Apr 2018 Revised 29 Apr 2018   Objectives:  Describe current approaches to diagnosis and treatment of chronic fibrosing ILDs with progressive phenotype  Describe the natural history of chronic fibrosing ILDs with progressive phenotype  Assess quality of life from self-administered participant reported questionnaires for each disease group  Describe participant interactions with the healthcare system, describe treatment practices across multiple institutions for each disease group  Collect biological samples linked to well characterized chronic fibrosing ILDs with progressive phenotype to identify disease biomarkers  Collect data and biological samples that will support future research studies.                                            Key Inclusion Criteria: Willing and able to provide informed consent  Age ? 30 years  Diagnosis of a non-IPF ILD of any duration, including, but not limited to Idiopathic Non-Specific Interstitial Pneumonia (INSIP), Unclassifiable Idiopathic Interstitial Pneumonias (IIPs), Interstitial Pneumonia with Autoimmune Features (IPAF), Autoimmune ILDs such as Rheumatoid Arthritis (RA-ILD) and Systemic Sclerosis (SSC-ILD), Chronic Hypersensitivity Pneumonitis (HP), Sarcoidosis or Exposure-related ILDs such as asbestosis.  Chronic fibrosing ILD defined by reticular abnormality with traction bronchiectasis with or without honeycombing confirmed by chest HRCT scan and/or lung biopsy.  Progressive phenotype as defined by fulfilling at least one of the criteria below of fibrotic changes (progression set point)  within the last 24 months regardless of treatment considered appropriate in individual ILDs:  decline in FVC % predicted (% pred) based on >10% relative decline  decline in FVC % pred based on ? 5 - <10% relative decline in FVC combined with worsening of respiratory symptoms as assessed by the site investigator  decline in FVC % pred based on ? 5 - <10% relative decline in FVC combined with increasing extent of fibrotic changes on chest imaging (HRCT scan) as assessed by the site investigator  decline in DLCO % pred based on ? 10% relative decline  worsening of respiratory symptoms as well as increasing extent of fibrotic changes on chest imaging (HRCT scan) as assessed by the site investigator independent of FVC change.     Key Exclusion Criteria: Malignancy, treated or untreated, other than skin or early stage prostate cancer, within the past 5 years  Currently listed for lung transplantation at the time of enrollment  Currently enrolled in a clinical trial at the time of enrollment in this registry       Clinical Research Coordinator / Research RN note : This visit for Cameron [name] Subject [id number] with DOB: [id number] on 25Oct2023 for the above protocol is Visit/Encounter 5 and is for purpose of research.    Subject expressed continued interest and consent in continuing as a study subject. Subject confirmed that there was no change in contact information (e.g. address, telephone, email). Subject thanked for participation in research and contribution to science.     During this visit on 25Oct2023 , the subject completed the blood work and questionnaires per the above referenced protocol. Please refer to the subject's paper source binder for further details.  Signed by Hollace Kinnier Research Assistant [facility]  [place], [place] 2:42pm [phone]

## 2022-03-30 ENCOUNTER — Encounter: Payer: Self-pay | Admitting: Cardiovascular Disease

## 2022-03-31 ENCOUNTER — Telehealth: Payer: Self-pay | Admitting: Cardiovascular Disease

## 2022-03-31 NOTE — Telephone Encounter (Signed)
Patient's wife is calling back in regards to swelling.

## 2022-03-31 NOTE — Telephone Encounter (Signed)
Patient's wife states she is returning a call regarding swelling.

## 2022-03-31 NOTE — Telephone Encounter (Signed)
Called and spoke with pt/wife. Pt's weight now is 180, pt states that he is up 8# today. Denies any SOB/DOE, pt did well at exercise class today. Just does not know why he is swelling. Pt does not take a water pill. Scheduled open appt tomorrow. Pt will arrive early fasting to discuss weight gain. Pt will come in fasting just in case lab work is needed.

## 2022-03-31 NOTE — Telephone Encounter (Signed)
Pt c/o swelling: STAT is pt has developed SOB within 24 hours  How much weight have you gained and in what time span? Has not gained weight   If swelling, where is the swelling located? Right leg swelling and a little in left leg   Are you currently taking a fluid pill? Yes  Are you currently SOB? No   Do you have a log of your daily weights (if so, list)? No  Have you gained 3 pounds in a day or 5 pounds in a week? No   Have you traveled recently? Yes, went on a fishing trip for a week.

## 2022-04-01 ENCOUNTER — Ambulatory Visit: Payer: Medicare Other | Attending: Cardiovascular Disease | Admitting: Cardiovascular Disease

## 2022-04-01 ENCOUNTER — Encounter: Payer: Self-pay | Admitting: Cardiovascular Disease

## 2022-04-01 ENCOUNTER — Other Ambulatory Visit: Payer: Self-pay | Admitting: Dermatology

## 2022-04-01 VITALS — BP 156/77 | HR 67 | Ht 67.0 in | Wt 181.6 lb

## 2022-04-01 DIAGNOSIS — M7989 Other specified soft tissue disorders: Secondary | ICD-10-CM | POA: Insufficient documentation

## 2022-04-01 DIAGNOSIS — I480 Paroxysmal atrial fibrillation: Secondary | ICD-10-CM | POA: Insufficient documentation

## 2022-04-01 DIAGNOSIS — Z950 Presence of cardiac pacemaker: Secondary | ICD-10-CM | POA: Diagnosis present

## 2022-04-01 DIAGNOSIS — E039 Hypothyroidism, unspecified: Secondary | ICD-10-CM | POA: Diagnosis present

## 2022-04-01 DIAGNOSIS — E785 Hyperlipidemia, unspecified: Secondary | ICD-10-CM | POA: Insufficient documentation

## 2022-04-01 DIAGNOSIS — I2510 Atherosclerotic heart disease of native coronary artery without angina pectoris: Secondary | ICD-10-CM | POA: Insufficient documentation

## 2022-04-01 DIAGNOSIS — J849 Interstitial pulmonary disease, unspecified: Secondary | ICD-10-CM | POA: Diagnosis present

## 2022-04-01 DIAGNOSIS — G4733 Obstructive sleep apnea (adult) (pediatric): Secondary | ICD-10-CM | POA: Diagnosis not present

## 2022-04-01 DIAGNOSIS — I10 Essential (primary) hypertension: Secondary | ICD-10-CM | POA: Insufficient documentation

## 2022-04-01 MED ORDER — HYDROCHLOROTHIAZIDE 25 MG PO TABS: 12.5000 mg | ORAL_TABLET | Freq: Every day | ORAL | 3 refills | Status: DC | PRN

## 2022-04-01 NOTE — Progress Notes (Signed)
Cardiology Office Note    Date:  04/08/2022   ID:  Cameron [name], DOB 12-Sep-1940, MRN [id number]  PCP:  [name], MD  Cardiologist:  [name], MD  EP: [name]  11 month follow-up evaluation  History of Present Illness:  Cameron [name] is a 81 y.o. 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  He has been evaluated at [facility] from the advanced heart failure team, Dr. Radford Pax with reference to obstructive sleep apnea, and [name] for his EP and pacemaker.  He also has seen Dr. Lubertha Sayres because of increasing shortness of breath.  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  He was also found to have aortic atherosclerosis with coronary calcification and enlarged pulmonary arteries with a peak PA pressure at 55 mm.  He was referred to Dr. Aundra Dubin who performed a diagnostic cardiac catheterization on August 11, 2017.  He was found to have normal left and right heart filling pressures.  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  He was found to have severe disease in his proximal circumflex, proximal OM1 proximal diagonal 3 and was felt that his dyspnea was the result of combination of parenchymal lung disease and coronary artery disease.  I was asked to perform invention to this bifurcation stenosis of his circumflex and OM1 vessel.  His procedure was difficult but successful with an excellent result in a 2.5 x 15 mm Resolute stent was inserted into the OM 1 vessel at the ostium and a 3.0 x 18 mm Resolute DES stent was inserted from the proximal circumflex into the mid AV groove circumflex beyond the  OM1 vessel.  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  That was the only time that I had seen the patient.  Apparently, the patient was recently seen by [name] who recommended he see me for his general cardiology care.  He presents for evaluation.    Since his intervention, he denies any anginal type symptoms.  He had noticed a rare episode of dizziness.  He has obstructive sleep apnea and has been on CPAP therapy for 2 months and was followed by Dr. Radford Pax for this.    He underwent carotid ultrasound imaging in June 2019 which showed minimal to moderate bilateral atherosclerotic plaque.  A head CT without contrast did not show any evidence of acute intracranial abnormality and showed mild atrophy and chronic small vessel white matter ischemic changes.  He has been evaluated by [name] who follows his pacemaker.   He was evaluated by [name], PA with several occasions and was last seen in January 2021.  That time, he he had mild shortness of breath symptoms only when he performs strenuous activity.  He did not have any anginal type chest pain.  He was having lower extremity mild edema and he was given a prescription for Lasix to take on an as-needed basis.  I evaluated him in a telemedicine visit on July 05, 2019.  He has continued to be followed by Cameron [name] for his primary care.  He is scheduled to undergo a breathing test with [name].  He has noticed his blood pressure  to be elevated as well as his heart rate in the upper 90s.  At that time, lisinopril was titrated to 20 mg and Bystolic was increased. He denies any anginal symptoms.  He has continued to use CPAP.  A download was obtained from April 02, 2019 through June 30, 2019 and he is meeting compliance with 97% of usage days.  Average CPAP use is 7 hours and 6 minutes.  At his set pressure of 7 cm, AHI is excellent at 1.3.   He was seen by [name], PA on September 05, 2019  his blood pressure was improved.   He had presented to the emergency room in March 2021 with elevated potassium.   I saw him in September 2021.  He was not having any anginal symptomatology.  Blood pressure continued to be elevated despite taking lisinopril 20 mg and Bystolic 10 mg and I recommended further titration of lisinopril to 30 mg.  He has continued to to be evaluated by [name] for his interstitial lung disease.  He  has noticed further blood pressure elevation despite taking Bystolic 10 mg, lisinopril 20 mg.  He has a prescription for furosemide but rarely takes this.  He continues to be on DAPT with aspirin/Plavix.  He is on Esbriet for his interstitial lung disease.  He is diabetic on glimepiride in addition to Metformin.  He was recently started on a prednisone taper.  He continues to use CPAP with excellent compliance.    I saw him on  August 08 2020 and since his prior evaluation he was evaluated by [name] and is in need for pacemaker replacement as well as removal and replacement of his atrial lead.  He has continued to walk at least 1 mile 3 days/week.  He has continued to use CPAP and a download was obtained from February 14 through August 07, 2020 which continues to show excellent compliance with average use of 6 hours and 7 minutes per night.  At 7 cm water pressure AHI is excellent at 1.3/h.  He denies any anginal symptoms.  His blood pressure improved with his medication adjustments from his last office visit.    Since my last evaluation, he was seen by Coletta Memos, NP in August 2022 and he was started on diltiazem 120 mg daily for increased palpitations.  He was recently evaluated by [name] on April 12, 2021 and it was felt at that time that the patient was in atrial flutter for approximately 2 weeks duration.  During that visit he was able to be paced to terminate his atrial flutter with an NIPS and with atrial pacing at 180 ms his atrial flutter terminated.  He continues to see [name] for his  interstitial lung disease.  His pulmonary function studies have indicated progression compared to 2 years previously.  He was recently felt by [name] that he may benefit from repeat right heart catheterization to further evaluate his pulmonary hypertension.  He has continued to be on Esbriet.    I last saw him on May 09, 2021 at which time he denied any anginal symptoms.  His heart rate has been stable.  He has been using CPAP therapy.  He underwent a follow-up echo Doppler study April 08, 2021 which showed EF at 60 to 65% and calculated at 61% by 3D volume.  There was grade 2 diastolic dysfunction and mild LVH.  There was moderate elevation of pulmonary artery systolic pressure 94.4 mmHg.  There was moderate  biatrial enlargement, mild to moderate mitral regurgitation and moderate mitral annular calcification.  There was aortic sclerosis without stenosis.  A small PFO cannot be completely excluded.  He presents for evaluation.  I performed his right heart catheterization on May 29, 2021 which revealed mild pulmonary hypertension in this patient with interstitial lung disease with elevated RV at 54/12, PA pressure 55/18, mean PA pressure 31 mmHg, and PVR 2.6 WU.  His PW mean pressure was 17 mm with a V wave of 24 consistent with his documented mild to moderate mitral regurgitation.  Since I last saw him, he has been evaluated by EP and saw Barrington Ellison, PA-C for [name].  He had normal pacemaker function.  He was maintaining sinus rhythm with atrial fibrillation burden less than 1% by device and he was maintained on Xarelto.  Presently, he feels well but admits to occasional leg swelling.  He denies chest pain or shortness of breath.  He has been exercising at least 3 days/week.  He continues to be on Esbriet for his interstitial lung disease.  He is on telmisartan 80 mg, nebivolol 10 mg daily, and diltiazem 120 mg daily for hypertension.  He is unaware of recurrent AF.  He continues  to be on glimepiride for diabetes mellitus.  He is on Armour Thyroid 90 mg for hypothyroidism.  He continues to take pravastatin 20 mg for hyperlipidemia.  He presents for evaluation.   Past Medical History:  Diagnosis Date   Appendicitis    Atrial fibrillation ([place]) 12/12/2013   BPH (benign prostatic hypertrophy)    CAD (coronary artery disease) 12/2015   Cath by Dr [name] reveals distal and small vessel CAD.  Medical therapy advised.   Chest pain 12/03/2015   CHF (congestive heart failure) (HCC)    Complete heart block ([place])    s/p PPM   Coronary artery disease    Coronary artery disease involving native coronary artery of native heart with unstable angina pectoris ([place]) 08/11/2017   History of blood transfusion 1968   "probably; related [place]   History of kidney stones    History of SCC (squamous cell carcinoma) of skin 07/24/2020   right upper arm/excision   Hyperglycemia 11/05/2013   Hyperlipidemia 11/05/2013   Hypertension    Hypothyroidism    Hypothyroidism, unspecified 11/05/2013   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  AHI was 20/h while supine.  Oxygen saturations dropped to 87%.  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   Rheumatoid arthritis ([place])    "hands" (08/11/2017)   Type II diabetes mellitus ([place])     Past Surgical History:  Procedure Laterality Date   BACK SURGERY     CARDIAC CATHETERIZATION N/A 12/28/2015   Procedure: Left Heart Cath and Coronary Angiography;  Surgeon: Belva Crome, MD;  Location: [place] [facility];  Service: Cardiovascular;  Laterality: N/A;   CATARACT EXTRACTION W/ INTRAOCULAR LENS  IMPLANT, BILATERAL Bilateral    CORONARY ANGIOPLASTY WITH STENT PLACEMENT  08/11/2017   "2 stents"   CORONARY STENT INTERVENTION N/A 08/11/2017    Procedure: CORONARY STENT INTERVENTION;  Surgeon: Troy Sine, MD;  Location: [place] [facility];  Service: Cardiovascular;  Laterality: N/A;   CYSTOSCOPY W/ STONE MANIPULATION  INGUINAL HERNIA REPAIR Left    INSERT / REPLACE / REMOVE PACEMAKER  12/09/2011   SJM Accent DR RF implanted by DR Allred for complete heart block and syncope   JOINT REPLACEMENT     LAPAROSCOPIC CHOLECYSTECTOMY     LEAD REVISION/REPAIR N/A 08/09/2020   Procedure: LEAD REVISION/REPAIR;  Surgeon: [name], MD;  Location: [place] [facility];  Service: Cardiovascular;  Laterality: N/A;   LITHOTRIPSY     LUMBAR [place]     "removed arthritis and spurs"   PERMANENT PACEMAKER INSERTION N/A 12/09/2011   Procedure: PERMANENT PACEMAKER INSERTION;  Surgeon: [name], MD;  Location: [facility] CATH LAB;  Service: Cardiovascular;  Laterality: N/A;   PPM GENERATOR CHANGEOUT N/A 08/09/2020   Procedure: PPM GENERATOR CHANGEOUT;  Surgeon: [name], MD;  Location: [place] [facility] LAB;  Service: Cardiovascular;  Laterality: N/A;   REPLACEMENT TOTAL KNEE Right    RIGHT HEART CATH N/A 05/29/2021   Procedure: RIGHT HEART CATH;  Surgeon: Troy Sine, MD;  Location: [place] [facility] LAB;  Service: Cardiovascular;  Laterality: N/A;   RIGHT/LEFT HEART CATH AND CORONARY ANGIOGRAPHY N/A 08/11/2017   Procedure: RIGHT/LEFT HEART CATH AND CORONARY ANGIOGRAPHY;  Surgeon: Larey Dresser, MD;  Location: [place] [facility];  Service: Cardiovascular;  Laterality: N/A;   TRANSURETHRAL RESECTION OF PROSTATE  2017/2018    Current Medications: Outpatient Medications Prior to Visit  Medication Sig Dispense Refill   acetaminophen (TYLENOL) 500 MG tablet Take 500 mg by mouth every 6 (six) hours as needed for moderate pain.     aspirin 81 MG EC tablet Take 81 mg by mouth daily.     CVS TRIPLE MAGNESIUM COMPLEX PO Take by mouth.     diltiazem (CARDIZEM CD) 120 MG 24 hr capsule Take 1 capsule (120 mg total) by mouth daily. 90 capsule 3    diltiazem (TIAZAC) 120 MG 24 hr capsule Take 120 mg by mouth.     ESBRIET 267 MG TABS TAKE 3 TABLETS THREE TIMES A DAY WITH MEALS (MAINTENANCE DOSE, START AFTER COMPLETED STARTER DOSE) 270 tablet 11   gabapentin (NEURONTIN) 300 MG capsule Take 300 mg by mouth at bedtime.     glimepiride (AMARYL) 2 MG tablet Take 2 mg by mouth every morning.     leflunomide (ARAVA) 10 MG tablet Take 10 mg by mouth daily.     metFORMIN (GLUCOPHAGE) 500 MG tablet Take 500 mg by mouth daily. 2 tablets daily     nebivolol (BYSTOLIC) 5 MG tablet Take 5 mg by mouth daily.     nitroGLYCERIN (NITROSTAT) 0.3 MG SL tablet Place under the tongue.     Omega-3 Fatty Acids (OMEGA-3 FISH OIL PO) Take 1 capsule by mouth daily.     pravastatin (PRAVACHOL) 20 MG tablet Take 1 tablet (20 mg total) by mouth every evening. *NEEDS OFFICE VISIT FOR FURTHER REFILLS* 90 tablet 0   telmisartan (MICARDIS) 80 MG tablet Take 80 mg by mouth daily.     thyroid (ARMOUR) 90 MG tablet Take 90 mg by mouth every morning.      Trospium Chloride 60 MG CP24 TAKE 1 CAPSULE BY MOUTH DAILY 30 capsule 2   TURMERIC PO Take 1 capsule by mouth daily.     XARELTO 20 MG TABS tablet TAKE ONE TABLET BY MOUTH EVERY DAY WITH SUPPERDISCONTINUE CLOPIDROGEL 30 tablet 3   Facility-Administered Medications Prior to Visit  Medication Dose Route Frequency Provider Last Rate Last Admin   ondansetron (ZOFRAN) 4 mg  in sodium chloride 0.9 % 50 mL IVPB  4 mg Intravenous Q6H PRN Ashok Pall, MD         Allergies:   Ace inhibitors, Lisinopril, and Celecoxib   Social History   Socioeconomic History   Marital status: Married    Spouse name: Not on file   Number of children: Not on file   Years of education: Not on file   Highest education level: Not on file  Occupational History   Not on file  Tobacco Use   Smoking status: Former    Packs/day: 1.00    Years: 5.00    Total pack years: 5.00    Types: Cigarettes    Quit date: 07/10/1972    Years since quitting:  49.7   Smokeless tobacco: Former    Types: Chew    Quit date: 1975  Vaping Use   Vaping Use: Never used  Substance and Sexual Activity   Alcohol use: Yes    Alcohol/week: 1.0 standard drink of alcohol    Types: 1 Glasses of wine per week   Drug use: No   Sexual activity: Not Currently  Other Topics Concern   Not on file  Social History Narrative   Not on file   Social Determinants of Health   Financial Resource Strain: Not on file  Food Insecurity: Not on file  Transportation Needs: Not on file  Physical Activity: Not on file  Stress: Not on file  Social Connections: Not on file     Family History:  The patient's family history includes Alzheimer's disease in his paternal grandmother and sister; Arthritis in his maternal grandfather; Bone cancer in his father; Heart attack in his paternal uncle; Lung cancer in his paternal grandfather; Pancreatic cancer in his mother.   ROS General: Negative; No fevers, chills, or night sweats;  HEENT: Negative; No changes in vision or hearing, sinus congestion, difficulty swallowing Pulmonary: Interstitial lung disease, reported exposure to agent orange Cardiovascular: See HPI GI: Negative; No nausea, vomiting, diarrhea, or abdominal pain GU: BPH followed by [facility] urology Musculoskeletal: Negative; no myalgias, joint pain, or weakness Hematologic/Oncology: Negative; no easy bruising, bleeding Endocrine: Negative; no heat/cold intolerance; no diabetes Neuro: Negative; no changes in balance, headaches Skin: Negative; No rashes or skin lesions Psychiatric: Negative; No behavioral problems, depression Sleep: OSA on CPAP, no breakthrough snoring.  No daytime sleepiness. Other comprehensive 14 point system review is negative.   PHYSICAL EXAM:   VS:  BP (!) 156/77 (BP Location: Left Arm, Patient Position: Sitting)   Pulse 67   Ht _0  (1.702 m)   Wt 181 lb 9.6 oz (82.4 kg)   SpO2 98%   BMI 28.44 kg/m    Repeat blood pressure by  me was initially 130/78 and on repeat 124/74    Wt Readings from Last 3 Encounters:  04/01/22 181 lb 9.6 oz (82.4 kg)  02/19/22 173 lb (78.5 kg)  02/11/22 171 lb 3.2 oz (77.7 kg)     General: Alert, oriented, no distress.  Skin: normal turgor, no rashes, warm and dry HEENT: Normocephalic, atraumatic. Pupils equal round and reactive to light; sclera anicteric; extraocular muscles intact; Nose without nasal septal hypertrophy Mouth/Parynx benign; Mallinpatti scale 3 Neck: No JVD, no carotid bruits; normal carotid upstroke Lungs: clear to ausculatation and percussion; no wheezing or rales Chest wall: without tenderness to palpitation Heart: PMI not displaced, RRR, s1 s2 normal, 1/6 systolic murmur, no diastolic murmur, no rubs, gallops, thrills, or heaves Abdomen: soft, nontender; no  hepatosplenomehaly, BS+; abdominal aorta nontender and not dilated by palpation. Back: no CVA tenderness Pulses 2+ Musculoskeletal: full range of motion, normal strength, no joint deformities Extremities: Mild leg swelling right greater than left; no clubbing cyanosis , Homan's sign negative  Neurologic: grossly nonfocal; Cranial nerves grossly wnl Psychologic: Normal mood and affect   Studies/Labs Reviewed:   April 01, 2022 ECG (independently read by me): AV paced at 67  May 09, 2021 ECG (independently read by me): AV paced at 67  August 08, 2020  ECG (independently read by me): AV paced at 55  September 2021 ECG (independently read by me): AV paced rhythm at 61 bpm  Recent Labs:    Latest Ref Rng & Units 11/27/2021    3:51 PM 10/16/2021   10:32 AM 10/01/2021   11:51 AM  BMP  Glucose 70 - 99 mg/dL 141  159  98   BUN 6 - 23 mg/dL 19  49  44   Creatinine 0.40 - 1.50 mg/dL 1.31  1.84  1.82   Sodium 135 - 145 mEq/L 141  136  137   Potassium 3.5 - 5.1 mEq/L 3.7  4.8  5.5   Chloride 96 - 112 mEq/L 105  102  101   CO2 19 - 32 mEq/L _0 Calcium 8.4 - 10.5 mg/dL 9.3  10.0  10.5          Latest Ref Rng & Units 11/27/2021    3:51 PM 10/16/2021   10:32 AM 10/01/2021   11:51 AM  Hepatic Function  Total Protein 6.0 - 8.3 g/dL 6.4  7.4  7.8   Albumin 3.5 - 5.2 g/dL 4.0  4.2  4.4   AST 0 - 37 U/L _1 ALT 0 - 53 U/L _2 Alk Phosphatase 39 - 117 U/L 66  68  66   Total Bilirubin 0.2 - 1.2 mg/dL 0.3  0.8  0.3   Bilirubin, Direct 0.0 - 0.3 mg/dL 0.1  0.2  0.1        Latest Ref Rng & Units 05/29/2021    8:48 AM 05/23/2021   11:51 AM 07/25/2020    3:43 PM  CBC  WBC 3.4 - 10.8 x10E3/uL  7.4  7.9   Hemoglobin 13.0 - 17.0 g/dL 13.0 - 17.0 g/dL 10.9    11.2  12.4  14.3   Hematocrit 39.0 - 52.0 % 39.0 - 52.0 % 32.0    33.0  37.5  42.3   Platelets 150 - 450 x10E3/uL  185  195    Lab Results  Component Value Date   MCV 92 05/23/2021   MCV 90 07/25/2020   MCV 92.7 08/02/2019   Lab Results  Component Value Date   TSH 0.174 (L) 09/13/2016   No results found for: "HGBA1C"   BNP No results found for: "BNP"  ProBNP    Component Value Date/Time   PROBNP 557 (H) 04/02/2017 1204     Lipid Panel  No results found for: "CHOL", "TRIG", "HDL", "CHOLHDL", "VLDL", "LDLCALC", "LDLDIRECT", "LABVLDL"   RADIOLOGY: No results found.    Additional studies/ records that were reviewed today include:   ECHO 1/12021  MPRESSIONS   1. Left ventricular ejection fraction, by visual estimation, is 50 to  55%. The left ventricle has normal function. There is no left ventricular  hypertrophy.   2. Elevated left atrial pressure.   3. Left ventricular diastolic  parameters are consistent with Grade II  diastolic dysfunction (pseudonormalization).   4. The left ventricle has no regional wall motion abnormalities.   5. Global right ventricle has normal systolic function.The right  ventricular size is mildly enlarged.   6. Left atrial size was normal.   7. Right atrial size was normal.   8. Mild mitral annular calcification.   9. The mitral valve is normal in  structure. Mild mitral valve  regurgitation. No evidence of mitral stenosis.  10. The tricuspid valve is normal in structure.  11. The aortic valve is tricuspid. Aortic valve regurgitation is trivial.  Mild aortic valve sclerosis without stenosis.  12. The pulmonic valve was normal in structure. Pulmonic valve  regurgitation is trivial.  13. Moderately elevated pulmonary artery systolic pressure.  14. A pacer wire is visualized.  15. The inferior vena cava is normal in size with greater than 50%  respiratory variability, suggesting right atrial pressure of 3 mmHg.  16. Normal LV systolic function; grade 2 diastolic dysfunction; trace AI;  mild MR; mild RVE; moderate pulmonary hypertension.   In comparison to the previous echocardiogram(s): 09/17/17 EF 55-60%. PA  pressure 18mHg.     ECHO: 04/08/2021 IMPRESSIONS   1. Left ventricular ejection fraction, by estimation, is 60 to 65%. Left  ventricular ejection fraction by 3D volume is 61 %. The left ventricle has  normal function. The left ventricle has no regional wall motion  abnormalities. There is mild left  ventricular hypertrophy. Left ventricular diastolic parameters are  consistent with Grade II diastolic dysfunction (pseudonormalization).   2. Right ventricular systolic function is normal. The right ventricular  size is normal. There is moderately elevated pulmonary artery systolic  pressure. The estimated right ventricular systolic pressure is 589.3mmHg.   3. Left atrial size was moderately dilated.   4. Right atrial size was moderately dilated.   5. The mitral valve is abnormal. Mild to moderate mitral valve  regurgitation. Moderate mitral annular calcification.   6. The tricuspid valve is abnormal. Tricuspid valve regurgitation is  moderate.   7. The aortic valve is tricuspid. Aortic valve regurgitation is trivial.  Aortic valve sclerosis is present, with no evidence of aortic valve  stenosis. Aortic regurgitation PHT  measures 594 msec.   8. Cannot exclude a small PFO.   Comparison(s): Changes from prior study are noted. 06/07/2019: LVEF 50-55%.    R HEART CATH: 05/29/2021 Mild pulmonary hypertension in this patient with interstitial lung disease with elevated RV 54/12; PA pressure 55/18; mean 31 mmHg.  PVR 2.6 WU.    PW mean pressure is 17 mmHg, with V wave 24 c/w his echo documented mild - moderate mitral regurgitation.    The patient will resume his home medications commencing tomorrow including resumption of Xarelto.    ASSESSMENT:    1. Essential hypertension   2. Coronary artery disease involving native coronary artery of native heart without angina pectoris   3. PAF (paroxysmal atrial fibrillation) ([place]   4. OSA on CPAP   5. Hyperlipidemia with target LDL less than 70   6. Pacemaker   7. ILD (interstitial lung disease) ([place]   8. Hypothyroidism, unspecified type   9. Leg swelling     PLAN:  1. CAD: He is status post PCI with DES stenting to his left circumflex and OM1 vessel on August 12, 2019.  He continues to be asymptomatic without recurrent anginal symptomatology.  Presently he continues to be on diltiazem 120 mg daily, nebivolol 10 mg  in addition to telmisartan 80 mg.  2.  Essential hypertension: Blood pressure today remains stable on current therapy as noted above with repeat blood pressure by me at 130/78 and again at 124/74.  3.  Dyspnea: At cardiac catheterization in 2019 he had normal left and right filling pressures with mild pulmonary hypertension.  On echo Doppler study in January 2021 he had  grade 2 diastolic dysfunction, trace AI, mild MR, mild RVE and moderate pulmonary hypertension with estimated RV systolic pressure of 90.1 mm.  He has interstitial lung disease followed by [name].  I reviewed his most recent echo Doppler study from April 08, 2021 which shows normal systolic function with EF at 60 to 65% and grade 2 diastolic dysfunction.  Estimated pulmonary  systolic pressure is now further increased at 54.8 mmHg.  There is evidence for moderate biatrial enlargement, mild to moderate mitral regurgitation with mitral annular calcification and aortic valve sclerosis.  He underwent successful right heart catheterization performed by me on May 29, 2021 as noted above.  He had mild pulmonary hypertension PA pressure 55/18, mean 31.  PVR 2.6 [place] units.  PW mean pressure was 17 with V wave up to 24 consistent with his mild to moderate mitral regurgitation.  4.  Leg swelling right greater than left: I will give a prescription for HCTZ 12.5 mg to take as a as needed basis.  If swelling persists support stockings 20 to 30 mm will be recommended.  5.  Obstructive sleep apnea: He continues to use CPAP therapy and denies any awareness of breakthrough snoring or residual daytime sleepiness.  Prior download showed an AHI of 1.3 without significant mask leak.    5.  Pacemaker: ECG shows AV paced rhythm.  Remote history of complete heart block.  He underwent pacemaker change out as well as new atrial lead placement by [name].  When most recently seen by [name] on April 12, 2021 he was in atrial flutter and he was successfully atrially paced back to sinus rhythm.  ECG today demonstrates AV paced rhythm.  I reviewed his last evaluation by Barrington Ellison.  Patient was maintaining sinus rhythm with AF burden less than 1% by device.  7.  Hyperlipidemia: He continues to be on pravastatin 20 mg daily.  Target LDL less than 70.    8.  BPH:  followed by [facility]  9.  Type 2 diabetes mellitus: Stable on glimepiride 2 mg and metformin 500 mg twice a day  10.  Hypothyroidism: On Armour Thyroid 90 mg daily, followed by Dr. Doy Hutching.       Medication Adjustments/Labs and Tests Ordered: Current medicines are reviewed at length with the patient today.  Concerns regarding medicines are outlined above.  Medication changes, Labs and Tests ordered today are  listed in the Patient Instructions below. Patient Instructions  Medication Instructions:  START HCTZ 12.5 mg daily as needed  *If you need a refill on your cardiac medications before your next appointment, please call your pharmacy*  Follow-Up: At [facility], you and your health needs are our priority.  As part of our continuing mission to provide you with exceptional heart care, we have created designated Provider Care Teams.  These Care Teams include your primary Cardiologist (physician) and Advanced Practice Providers (APPs -  Physician Assistants and Nurse Practitioners) who all work together to provide you with the care you need, when you need it.  We recommend signing up for the patient portal called "MyChart".  Sign up information is provided on this After Visit Summary.  MyChart is used to connect with patients for Virtual Visits (Telemedicine).  Patients are able to view lab/test results, encounter notes, upcoming appointments, etc.  Non-urgent messages can be sent to your provider as well.   To learn more about what you can do with MyChart, go to [email or web address].    Your next appointment:   12 month(s)  The format for your next appointment:   In Person  Provider:   [name], MD             Signed, [name], MD  04/08/2022 1:39 PM    [facility] [address], [place], [place], [place]  [address] Phone: [phone]

## 2022-04-01 NOTE — Patient Instructions (Signed)
Medication Instructions:  START HCTZ 12.5 mg daily as needed  *If you need a refill on your cardiac medications before your next appointment, please call your pharmacy*  Follow-Up: At [facility], you and your health needs are our priority.  As part of our continuing mission to provide you with exceptional heart care, we have created designated Provider Care Teams.  These Care Teams include your primary Cardiologist (physician) and Advanced Practice Providers (APPs -  Physician Assistants and Nurse Practitioners) who all work together to provide you with the care you need, when you need it.  We recommend signing up for the patient portal called "MyChart".  Sign up information is provided on this After Visit Summary.  MyChart is used to connect with patients for Virtual Visits (Telemedicine).  Patients are able to view lab/test results, encounter notes, upcoming appointments, etc.  Non-urgent messages can be sent to your provider as well.   To learn more about what you can do with MyChart, go to [email or web address].    Your next appointment:   12 month(s)  The format for your next appointment:   In Person  Provider:   [name], MD

## 2022-04-08 ENCOUNTER — Encounter: Payer: Self-pay | Admitting: Cardiovascular Disease

## 2022-05-06 ENCOUNTER — Ambulatory Visit (INDEPENDENT_AMBULATORY_CARE_PROVIDER_SITE_OTHER): Payer: Medicare Other | Admitting: Internal Medicine

## 2022-05-06 ENCOUNTER — Encounter: Payer: Self-pay | Admitting: Internal Medicine

## 2022-05-06 VITALS — BP 120/68 | HR 87 | Temp 97.8°F | Ht 67.0 in | Wt 173.6 lb

## 2022-05-06 DIAGNOSIS — R432 Parageusia: Secondary | ICD-10-CM

## 2022-05-06 DIAGNOSIS — J849 Interstitial pulmonary disease, unspecified: Secondary | ICD-10-CM | POA: Diagnosis not present

## 2022-05-06 DIAGNOSIS — Z79899 Other long term (current) drug therapy: Secondary | ICD-10-CM

## 2022-05-06 DIAGNOSIS — M0600 Rheumatoid arthritis without rheumatoid factor, unspecified site: Secondary | ICD-10-CM

## 2022-05-06 DIAGNOSIS — Z77098 Contact with and (suspected) exposure to other hazardous, chiefly nonmedicinal, chemicals: Secondary | ICD-10-CM | POA: Diagnosis not present

## 2022-05-06 DIAGNOSIS — I2510 Atherosclerotic heart disease of native coronary artery without angina pectoris: Secondary | ICD-10-CM | POA: Diagnosis not present

## 2022-05-06 DIAGNOSIS — R634 Abnormal weight loss: Secondary | ICD-10-CM

## 2022-05-06 NOTE — Patient Instructions (Addendum)
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 REcently stable.  Hx of covid March 2023 - did not affect your fibrosis  Plan - check LFT ,bmet 05/06/2022 or anytimex next 2-3 weeks - take esbriet holiday for 2 weeks and see if weight/taste improve - restart esbriet after 2 weeks  - 1 pill three times daily x 1 week and then 2 pills three times daily - [eat with food, space adequately between meals and apply sunscreen at all times -Continue daily exercise as before - continue ILD-PRO registry participation as before (last visit 2 months ago) - spirometry and dlco in 3 months   Dysgeusia Unintentioal weight loss  -Could be because of pirfenidone but still currently technically overweight but approaching normal weight.  Plan - Pirfenidone holiday for 2 weeks and then reassess  Shortness of breath Grade 2 diastolic dysfunction Pulmonary venous hypertension  -curerntly stabkle  Plan  - per Dr Claiborne Billings   Followup - - retunr in 3 months - see Dr [name] - 30 min but after PFT  - symptom score and walk test at followup

## 2022-05-06 NOTE — Progress Notes (Signed)
PCP [name], MD  HPI   IOV 07/10/2017  Chief Complaint  Patient presents with   Advice Only    Referred by [facility] due to SOB.  PFT done 05/28/17.  Pt has been having issues with SOB x4 months especially with exertion and has some mild chest tightness. Denies any cough.    81 year old male referred by [name] for evaluation of shortness of breath after cardiac etiologies ruled out.  He tells me that he is a remote smoker.  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  He has a long-standing history of rheumatoid arthritis followed by Dr. Jefm Bryant in [place].  He is to be on methotrexate for many years and stopped taking it because of cardiac dysfunction [he personally is convinced that methotrexate because this].  He was then on leflunomide as of 2017 but is currently not on it.  His last rheumatoid factor and CCP antibodies were negative on my personal chart review of the outside records in 2016.    Now for the last 3 or 6 months he is got insidious onset of shortness of breath that is slowly progressive.  His dyspnea on exertion relieved by rest.  Class II-3 activities.  There I  s no associated cough or orthopnea proximal nocturnal dyspnea.  He did have some edema but this got cleared up but the dyspnea is continuing to get worse.  In the last few months he has had a cardiac echo that showed pulmonary hypertension.  Did have cardiac stress test that is normal.  Had pulmonary function test that showed isolated reduction in diffusion capacity and therefore he has been referred here.  Walking desaturation test on 07/10/2017 185 feet x 3 laps on ROOM AIR:  did NOT desaturate. Rest pulse ox was 100%, final pulse ox was 98%. HR response 60/min at rest to 121/min at peak exertion. Patient Cameron [name]  Did not Desaturate < 88% . Cameron [name] did not  Desaturated  </= 3% points. Gerda Diss Acocella yes did get tachyardic   OV 08/04/2017  Chief Complaint  Patient presents with   Follow-up    ILD    Follow-up interstitial lung disease workup  After the last visit no interim problems.  He has some chronic pedal edema that he will talk to about with his primary care physician.  He did see Dr. Jefm Bryant rheumatologist in July 15, 2017.  I reviewed his notes.  It is not specifically indicate patient has nonspecific seronegative arthritis with a differential diagnosis of seronegative rheumatoid arthritis versus psoriatic.  Patient still seems to think the root of all his problems is the methotrexate he took for over 10 years.  At this point in time there is no decompensation.  As part of the ILD workup his pulmonary function test shows mild reduction in diffusion capacity.  Correlating with this is evidence of pulmonary hypertension on the echo and high-resolution CT chest enlarged pulmonary arteries.  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  His autoimmune panel is negative.  And so the vasculitis panel and hypersensitivity pneumonitis panel.    IMPRESSION: 1. Mild subpleural reticular densities in the posterolateral aspects of both lower lobes are suspicious for mild fibrotic interstitial lung disease such as  nonspecific interstitial pneumonitis. Early/mild usual interstitial pneumonitis is not excluded. 2. Aortic atherosclerosis (ICD10-170.0). Coronary artery calcification. 3. Enlarged pulmonary arteries, indicative of pulmonary arterial Hypertension.- > in echo  Nov 2018: Pulmonary arteries: Systolic pressure was moderately increased.   PA peak pressure: 55 mm Hg (S). 4. Left renal stone, partially imaged.     Electronically Signed   By: Lorin Picket M.D.   On: 07/22/2017 15:03  OV 11/03/2017  Chief Complaint  Patient presents with   Follow-up    Pt has SOB with  exertion, some dry cough.     Follow-up suspected interstitial lung disease in the setting of rheumatoid arthritis and long methotrexate intake  He presents with his wife.  At the time of last visit I was not fully convinced that he had interstitial lung disease.  His CT scan indicated presence of pulmonary hypertension and so did his echocardiogram.  Therefore I referred him back to [name] cardiology.  In the spring 2019 he did have a right heart catheterization and left heart catheterization that showed mild pulmonary hypertension but also coronary artery blockage.  He status post 2 stents.  After that his shortness of breath improved but he tells me overall his fatigue level has not improved.  In talking to him I find out that he exercises 5 times a week doing heart track 2 times a week and the other 3 days walking a mile each time and doing weight lifting.  It appears that he takes a 20-minute nap after these exercises and then feels reenergized.  His mother feels that he does not have the effort tolerance as his younger days but he denies having any symptoms of chest pain or shortness of breath or cough when he does these heavy exertion the weight lifting or walking a mile out doing heart track.  In fact in the walking desaturation test today he walked extremely fast and had no problems.  In terms of his possible interstitial lung disease he had pulmonary function test today and felt to show some improvement and on exam he does not have any crackles.  Also his wife tells me that for the last 2 months he is using CPAP for sleep apnea and this is also helping him.  Right Heart Pressures RHC Procedural Findings: Hemodynamics (mmHg) RA mean 2 RV 42/6 PA 42/8, mean 21 PCWP mean 7 LV 134/8 AO 147/52       OV 06/01/2019  Subjective:  [name], male , DOB: [birth year] , age 81 y.o. , MRN: [id number] , ADDRESS: [place] [id number]   06/01/2019 -   Chief  Complaint  Patient presents with   Follow-up    Pt states he has been doing okay since last visit but states he has been having a little more SOB x4 weeks now. Pt also has occ coughing with yellow phlegm.   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 -returns for follow-up.  I personally have not seen him since the summer 2019.  He says overall he has been stable.  In August 2020 he had a CT scan of the chest that confirmed the presence of interstitial lung disease in the setting of his rheumatoid arthritis.  He was stable.  His pulmonary function test was stable.  He says now in the last 2 months has had a decline in shortness of breath.  There is also some cough with sputum production but that has resolved.  It is definitely present with exertion but relieved by rest.  His walking desaturation test compared to 18 months ago is roughly the same except that he is very tachycardic.  He does have a pacemaker.  He has an appoint with [name] his electrophysiologist today.  His symptom scores are listed below.     ROS - per HPI     OV 08/01/2019 - telephine visit - identified with 2 person identifier. Telephone visit - limits, risks benefits explained  Subjective:  [name], male , DOB: 1941-02-04 , age 60 y.o. 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 - similar dyspnea compared to JAn 2021.  No better nor worse.  After last visit he has seen cardiology x2.  It appears the final conclusion is that diastolic dysfunction might be contributing to his shortness of breath.  But overall not major changes in his cardiac care.  He tells me that overall he is stable.  He had spirometry and DLCO the DLCO itself appears to be  reduced compared to the recent 1 but stable compared to older ones.  The FVC suggest decline.  Patient himself feels stable.  Overall some mixed picture.  Last high-resolution CT chest was October 2020.    IMPRESSION: 1. 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 There is no significant air trapping on expiratory phase imaging. Findings are not significantly changed compared to prior examinations and remain in an "indeterminate for UIP" pattern by ATS pulmonary fibrosis criteria, differential considerations including both UIP and NSIP.   2.  Coronary artery disease and aortic atherosclerosis.   3.  Left nephrolithiasis.     Electronically Signed   By: Eddie Candle M.D.   On: 01/12/2019 15:08    OV 10/17/2019  Subjective:  [name], male , DOB: Nov 20, 1940 , age 81 y.o. , MRN: [id number] , ADDRESS: [place]  [id number]   10/17/2019 -   Chief Complaint  Patient presents with   Follow-up    pt states sobwhen doing activities.   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 -presents for follow-up after having his spirometry and DLCO and high-resolution CT chest.  Overall he feels stable compared to the last visit but he says definitely compared to 2 years ago his symptoms are worse.  Compared to 1 year ago his symptoms are the same.  He is kind of leery of taking new medications.  His high-resolution CT scan of the chest indicates mild progression since 2019 February.  His pulmonary function tests also indicate progression compared to 2 years ago but fluctuant in more recent times.  He is reporting agent orange exposure and is  wondering if his ILD could be related to that.  He reminded me that he is already on the ILD-pro registry study.  His next scheduled visit is in October 2021.   High-resolution CT chest May 2021 Lungs/Pleura: Peripheral and basilar predominant subpleural interlobular and intra  lobular septal thickening and ground-glass. Findings persist on prone imaging and appear similar to 01/12/2019 but may be minimally progressive from 07/22/2017. 4 mm peripheral left lower lobe nodule (14/101), stable from 07/22/2017 and considered benign. Lungs are otherwise clear. No pleural fluid. Airway is unremarkable. Mild air trapping.   Upper Abdomen: Visualized portions of the liver and adrenal glands are unremarkable. Stones are seen in the kidneys bilaterally. Spleen and visualized portions of the pancreas, stomach and bowel are grossly unremarkable. Cholecystectomy. No upper abdominal adenopathy.   Musculoskeletal: Degenerative changes in the spine. No worrisome lytic or sclerotic lesions.   IMPRESSION: 1. Pulmonary parenchymal pattern of fibrosis appears grossly stable from 01/12/2019 but may be minimally progressive from 07/22/2017. 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 Usual interstitial pneumonitis is certainly not excluded. Findings are indeterminate for UIP per consensus guidelines: Diagnosis of Idiopathic Pulmonary Fibrosis: An Official ATS/ERS/JRS/ALAT Clinical Practice Guideline. [place], Iss 5, [id number], Jan 24 2017. 2. Bilateral renal stones. 3. Aortic atherosclerosis (ICD10-I70.0). Coronary artery calcification. 4. Enlarged pulmonic trunk, indicative of pulmonary arterial hypertension.     Electronically Signed   By: Lorin Picket M.D.   On: 10/10/2019 14:00  ROS - per HPI     OV 06/29/2020  Subjective:  [name], male , DOB: May 08, 1941 , age 17 y.o. , MRN: [id number] , ADDRESS: [address] [place] [address] PCP [name], MD Patient Care Team: [name], MD as PCP - General (Unknown Physician Specialty) [name], MD as PCP - Electrophysiology (Cardiology) Troy Sine, MD as PCP - Cardiology (Cardiology) Sueanne Margarita, MD as PCP - Sleep Medicine (Cardiology)  This Provider for this visit: Treatment Team:  Attending Provider: Brand Males, MD    06/29/2020 -   Chief Complaint  Patient presents with   Follow-up    SOB unchanged, slight nonproductive cough. Esbriet doing well.   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 Normal echo Jan 2021   HPI Cameron [name] 81 y.o. -returns for follow-up.  He presents with his wife.  Last seen in May 2021.  After that in June 2021 when he started pirfenidone for progressive ILD.  He tells me that he has been tolerating pirfenidone just fine.  Of note he has not had any liver function test since he started pirfenidone.  He is not having any GI side effects of skin side effects from the pirfenidone.  In the last 6 months his ILD stable according to his history.  His walking desaturation test and symptom scores are stable.  He is up-to-date with his Covid vaccine.  He is on leflunomide.  He is on the ILD-pro registry study with a last visit was in November 2021.  He needs a visit every 6 months.  He is willing to get a Covid IgG checked because he is immunosuppressed.  This is in response to humoral immunity to vaccine     OV 04/09/2021  Subjective:  [name], male , DOB: [birth year] , age 53 y.o. 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 Normal echo Jan 2021  HPI Cameron [name] 81 y.o. -returns for follow-up.  He tells me that in the last 6 months since his last visit he is having progressively more shortness of breath with exertion relieved by rest.  In fact his dyspnea score shows worsening to a total score of 9 compared to earlier in the year.  But there is no worsening cough.  He had pulmonary function test that shows continued stability since 2021 feb but definitely worse since 2019 and 2020.  In other words the stability correlates with him starting pirfenidone.  Explained to him that the pirfenidone is helping his ILD stability.  Nevertheless his symptoms are worse.  He had echocardiogram yesterday that shows continued elevation in pulmonary artery pressures [2019 borderline elevation] and grade 2 diastolic dysfunction.  Of note he had his ILD-Pro registry research visit today. Last liver function test for drug-induced liver injury monitoring was in February 2022.  GFR at that time was 57.  In March 2022.  His wife has suspected ILD and she is also with him in this visit.  I saw her for a scheduled visit just earlier.      OV  06/13/2021  Subjective:  [name], male , DOB: June 15, 1940 , age 2 y.o. , MRN: [id number] , ADDRESS: [place] [address] PCP [name], MD Patient Care Team: [name], MD as PCP - General (Unknown Physician Specialty) [name], MD as PCP - Electrophysiology (Cardiology) Troy Sine, MD as PCP - Cardiology (Cardiology) Sueanne Margarita, MD as PCP - Sleep Medicine (Cardiology)  This Provider for this visit: Treatment Team:  Attending Provider: Brand Males, MD    06/13/2021 -   Chief Complaint  Patient presents with   Follow-up    Pt is following up after recent heart cath.    HPI Cameron [name] 81 y.o. -returns for follow-up to discuss the implications of the right heart cath results.  He had right heart cath on 05/29/2021.  Around this time he also had some mild anemia that is documented below.  But his recent creatinine is normal.  His right heart cath shows worsening of mean arterial pressure compared to a few years ago but his PVR is low and his wedge is high.  He also has moderate mitral regurgitation and had a high V wave.  Compared to last visit he stable but over time he said worsening dyspnea.  He is here with his wife.  I told them both that this looks like pulmonary venous hypertension.  He does not have edema.  We discussed the possibility of doing Lasix  therapy.  I told him he does not meet official criteria for inhaled treprostinil and WHO group 3 pulmonary hypertension.  For inhaled treprostinil he would need a wedge less than 15 and a PVR greater than 3.  He does not have that.  He is agreed to do Lasix at this point.  He will have to follow-up with Dr. [name] for this.  In terms of his interstitial lung disease he has a surveillance CT scan coming up in March 2023 [last CT was then May 2021].  I think we will postpone this and just see him in 4 to 6 months for ILD follow-up given recent stability and  pulmonary function test.  What he needs is follow-up for his shortness of breath and to see how the Lasix is performing.  For this we will make a nurse practitioner visit and also encouraged him to go and see Dr. Claiborne Billings.  CT Chest data  No results found.   07/11/2021: Today - follow up Patient presents today with wife via virtual visit for follow up after being started on lasix for mild pulmonary HTN. He states he started the medication but has since stopped due to some bladder issues. He reports his breathing has been stable and overall, he feels pretty well. He denies any lower extremity swelling, palpitations, orthopnea, PND, cough or wheezing. He is doing well on Esbriet. He has developed a rash over the last 3-4 days after being transitioned from metformin to [place]. He denies any facial or tongue swelling, difficulties breathing or swallowing, or N/V.     OV 10/01/2021  Subjective:  [name], male , DOB: 1941/04/07 , age 82 y.o. , MRN: [id number] , ADDRESS: [place] [address] PCP [name], MD Patient Care Team: [name], MD as PCP - General (Unknown Physician Specialty) [name], MD as PCP - Electrophysiology (Cardiology) Troy Sine, MD as PCP - Cardiology (Cardiology) Sueanne Margarita, MD as PCP - Sleep Medicine (Cardiology)  This Provider for this visit: Treatment Team:  Attending Provider: Brand Males, MD    10/01/2021 -   Chief Complaint  Patient presents with   Follow-up    PFT performed today.  Pt states he has been doing good since last visit and denies any complaints.    HPI Cameron [name] 81 y.o. -returns for routine follow-up.  After I saw him in January 2023 we gave him a trial of Lasix because of diastolic dysfunction and pulmonary venous hypertension but this did not help.  He denied acute video visit in March 2023 because of COVID-19.  He took antiviral.  Currently doing well except for  the fact that he has had increased fatigue compared to baseline [the symptom score].  The fatigue is significant especially by the afternoon.  Also for the last few weeks he has had diarrhea that is moderate in intensity.  There is no changes in his medications.  Over time his creatinine has gotten worse.  He does have chronic kidney disease and is seen his primary care.  He also tells me that he was taking potassium and recently has had hyperkalemia.  This was getting worse as of yesterday with a potassium of 6.1 mEq.  This was at outside facility and I was able to review the lab results.  His primary care is intently watching this.  He is off his diuretics and potassium supplementation.  I have instructed him to have  repeat potassium today.  In terms of his ILD it is stable.  Pulmonary function tests are stabilized.  His CT scan of the chest was reviewed and this shows a stable ILD in the last 2 years.  Unclear to me if he is still on leflunomide or not. He is supposed have a research registry follow-up visit today.  Creatinine profile shows a slow worsening over time.          OV 11/27/2021  Subjective:  [name], male , DOB: 1940-09-23 , age 54 y.o. , MRN: [id number] , ADDRESS: [place] [address] PCP [name], MD Patient Care Team: [name], MD as PCP - General (Unknown Physician Specialty) [name], MD as PCP - Electrophysiology (Cardiology) Troy Sine, MD as PCP - Cardiology (Cardiology) Sueanne Margarita, MD as PCP - Sleep Medicine (Cardiology)  This Provider for this visit: Treatment Team:  Attending Provider: Brand Males, MD    11/27/2021 -   Chief Complaint  Patient presents with   Follow-up    Pt to discuss Esbriet. Pt still doing good since LOV.    HPI Cameron [name] 81 y.o. -returns for follow-up.  At the last visit in May 2023 because of rising creatinine we reduce his pirfenidone dose  particularly in the setting of fatigue.  It seems lowering the pirfenidone dose does not really help the fatigue but it does seem to overall make him feel better.  He is stable.  His dyspnea is better.  His diarrhea is very minimal to nonexistent.  He had pulmonary function test and this shows continued stability since being on pirfenidone.  He had ILD-Pro registry visit in the spring 2023.  His next visit is due sometime around October 2023.  We discussed his rising creatinine.  I advised him to stay on the lower dose of the pirfenidone.  This would be 2 pills 3 times daily.  He did indicate and his wife also indicated that he is having difficult time controlling his hyperkalemia.  Review of his medication shows that he is on ARB s [telmisartan].  Discussed this is a potential contributory etiology for his hyperkalemia.  He is willing to get his creatinine and his potassium checked today in the office.  But his primary care is the one who is managing that.   Noted he is on fish oil but he says this actually helps him with his arthritis.     OV 05/06/2022  Subjective:  [name], male , DOB: 1940-10-21 , age 19 y.o. , MRN: [id number] , ADDRESS: [place] [phone] PCP [name], MD Patient Care Team: [name], MD as PCP - General (Unknown Physician Specialty) [name], MD as PCP - Electrophysiology (Cardiology) Troy Sine, MD as PCP - Cardiology (Cardiology) Sueanne Margarita, MD as PCP - Sleep Medicine (Cardiology) Dillon, Tami Lin, [place] as Physician Assistant (Cardiology)  This Provider for this visit: Treatment Team:  Attending Provider: Brand Males, MD    05/06/2022 -   Chief Complaint  Patient presents with   Follow-up    Pt states he has been doing okay since last visit and denies any complaints.    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 Pn med list May 2023  = Started pirfenidone June 2021  - ILD-Pro registry  stdy  - last visit 04/09/2021  - Last HRCT May 2021 -. May 2023 without change (indeterminate)  Abnormal Echo - Gr 2 ddx Nov 2022   - ef 60%  - Gr 2 ddx  - RVSP 54  - Mitral and Tricuspid Mod regurg   RHC 05/29/21 - Mild pulmonary hypertension in this patient with interstitial lung disease with elevated RV 54/12; PA pressure 55/18; mean 31 mmHg.  PVR 2.6 WU. PW mean pressure is 17 mmHg, with V wave 24 c/w his echo documented mild - moderate mitral regurgitation.   CKD -  -07/13/2019: Creatinine 1.2 mg percent -05/23/2021 creatinine 1.15 mg percent  - 06/13/21 creat 1.71m%, GFR 39 - 09/30/21 creat 1.657mFR 42 - 10/16/21 - creat 1.8467m- GFR 34 -11/27/2021: Creatinine 1.31 mg percent\\   Mild COVID early 2023.  HPI Cameron [name] 57o. -returns for follow-up.  Presents with his wife.  He feels he is doing stable.  Symptom score shows stability but to me he obviously looks like he has lost weight.  He then told me that ever since COVID earlier in the year he is having dysgeusia and low appetite and therefore he is losing weight.  Review of the records indicate he is lost 20 pounds in the last 1 year.  I did indicate to him that concern of COVID causing his dysgeusia is pirfenidone could be causing his dysgeusia and low appetite and weight loss.  He is willing to give a short holiday and reassess.  He has not had pulmonary function test recently but he feels stable.  He is due for monitoring safety labs with his pirfenidone high risk medication.  He continues on immunosuppression.      SYMPTOM SCALE - ILD 10/17/2019 Started pirfenidone June 2021 06/29/2020  04/09/2021 193# 10/01/2021 Esbriet lower dose since may 2023  186# 05/06/2022 Esbiret lowe rdose due to CKD  173#  O2 use _0   Shortness of Breath 0 -> 5 scale with 5 being worst (score 6 If unable to do)      At rest  0 0 0 0 1  Simple tasks - showers, clothes change, eating, shaving 1 1 0 0 0  Household (dishes, doing bed, laundry) 1 0 0 0 0  Shopping 0 0 1 0 1  Walking level at own pace 2 0 4 0 2  Walking up Stairs _1 Total (30-36) Dyspnea Score _2 How bad is your cough? x 1  0 2  How bad is your fatigue _3 How bad is nausea 0 0 0 0 0  How bad is vomiting?  0 0 0 0 0  How bad is diarrhea? 1 0 0 0.5 0  How bad is anxiety? _4 How bad is depression _5 Simple office walk 185 feet x  3 laps goal with forehead probe 11/03/2017  06/01/2019  06/29/2020  04/09/2021  10/01/2021   O2 used Room air Room air  ra ra  Number laps completed _6 Comments about pace Fast very  99% and 66     Resting Pulse Ox/HR 100% and 70/min 99% and 66/min 97% and 71 100% and Pulse 77 100% and  HR 67  Final Pulse Ox/HR 98% and 121/min 98% and 120 min 98% and  121/min 99% and HR 114 97% and HR 112  Desaturated </= 88% no    no  Desaturated <= 3% points no    Yes 3 pnts  Got Tachycardic >/= 90/min yes    yes  Symptoms at end of test none Mild dyspea No dyspnea Dyspnea at end Noe copmpaint  Miscellaneous comments veryu fast Mod pace fast Fast pace moderate   CT Chest data  No results found.   PFT     Latest Ref Rng & Units 10/01/2021   10:03 AM 04/17/2020    9:34 AM 10/13/2019    3:53 PM 07/21/2019    3:53 PM 01/17/2019   12:39 PM 11/03/2017    2:10 PM 05/28/2017    1:28 PM  PFT Results  FVC-Pre L 3.27  3.05  3.08  2.92  3.41  3.59  3.29   FVC-Predicted Pre % 93  86  86  81  94  98  90   FVC-Post L       3.28   FVC-Predicted Post %       89   Pre FEV1/FVC % % 84  81  81  82  82  83  87   Post FEV1/FCV % %       83   FEV1-Pre L 2.75  2.46  2.49  2.40  2.78  2.98  2.85   FEV1-Predicted Pre % 112  98  98  94  107  114  109   FEV1-Post L       2.72   DLCO uncorrected ml/min/mmHg 18.84  19.38  19.58  20.08  21.58  20.97  20.35   DLCO UNC% % 85  87  87  89  96  74  71    DLCO corrected ml/min/mmHg 18.63  19.38  19.58  20.08      DLCO COR %Predicted % 84  87  87  89      DLVA Predicted % 92  105  101  112  104  89  92   TLC L       5.47   TLC % Predicted %       84   RV % Predicted %       80        has a past medical history of Appendicitis, Atrial fibrillation (HCC) (12/12/2013), BPH (benign prostatic hypertrophy), CAD (coronary artery disease) (12/2015), Chest pain (12/03/2015), CHF (congestive heart failure) (HCC), Complete heart block (HCC), Coronary artery disease, Coronary artery disease involving native coronary artery of native heart with unstable angina pectoris ([place]) (08/11/2017), History of blood transfusion (1968), History of kidney stones, History of SCC (squamous cell carcinoma) of skin (07/24/2020), Hyperglycemia (11/05/2013), Hyperlipidemia (11/05/2013), Hypertension, Hypothyroidism, Hypothyroidism, unspecified (11/05/2013), 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   reports that he quit smoking about 49 years ago. His smoking use included cigarettes. He has a 5.00 pack-year smoking history. He quit smokeless tobacco use about 48 years ago.  His smokeless tobacco use included chew.  Past Surgical History:  Procedure Laterality Date   BACK SURGERY     CARDIAC CATHETERIZATION N/A 12/28/2015   Procedure: Left Heart Cath and Coronary Angiography;  Surgeon: Belva Crome, MD;  Location: [facility];  Service: Cardiovascular;  Laterality: N/A;   CATARACT EXTRACTION W/ INTRAOCULAR LENS  IMPLANT, BILATERAL  Bilateral    CORONARY ANGIOPLASTY WITH STENT PLACEMENT  08/11/2017   "2 stents"   CORONARY STENT INTERVENTION N/A 08/11/2017   Procedure: CORONARY STENT INTERVENTION;  Surgeon: Troy Sine, MD;  Location: [facility];  Service: Cardiovascular;  Laterality: N/A;   CYSTOSCOPY W/ STONE MANIPULATION     INGUINAL HERNIA REPAIR Left    INSERT / REPLACE / REMOVE PACEMAKER  12/09/2011   SJM Accent DR RF implanted by DR Allred for complete heart block and syncope   JOINT REPLACEMENT     LAPAROSCOPIC CHOLECYSTECTOMY     LEAD REVISION/REPAIR N/A 08/09/2020   Procedure: LEAD REVISION/REPAIR;  Surgeon: [name], MD;  Location: [facility];  Service: Cardiovascular;  Laterality: N/A;   LITHOTRIPSY     LUMBAR [place]     "removed arthritis and spurs"   PERMANENT PACEMAKER INSERTION N/A 12/09/2011   Procedure: PERMANENT PACEMAKER INSERTION;  Surgeon: [name], MD;  Location: [facility];  Service: Cardiovascular;  Laterality: N/A;   PPM GENERATOR CHANGEOUT N/A 08/09/2020   Procedure: PPM GENERATOR CHANGEOUT;  Surgeon: [name], MD;  Location: [facility];  Service: Cardiovascular;  Laterality: N/A;   REPLACEMENT TOTAL KNEE Right    RIGHT HEART CATH N/A 05/29/2021   Procedure: RIGHT HEART CATH;  Surgeon: Troy Sine, MD;  Location: [facility];  Service: Cardiovascular;  Laterality: N/A;   RIGHT/LEFT HEART CATH AND CORONARY ANGIOGRAPHY N/A 08/11/2017   Procedure: RIGHT/LEFT HEART CATH AND CORONARY ANGIOGRAPHY;  Surgeon: Larey Dresser, MD;  Location: [facility];  Service: Cardiovascular;  Laterality: N/A;   TRANSURETHRAL RESECTION OF PROSTATE  2017/2018    Allergies  Allergen Reactions   Ace Inhibitors Swelling    Tongue swelling, angioedema   Lisinopril     Other reaction(s): Lip swelling, O/E - lip swelling   Celecoxib Rash    Skin rash     Immunization History  Administered Date(s) Administered   Fluad Quad(high Dose 65+) 02/11/2020   Influenza Inj Mdck Quad Pf 03/25/2021   Influenza, High Dose Seasonal PF 03/26/2017, 02/11/2022   Influenza-Unspecified 05/03/2015, 02/13/2016, 03/26/2016, 03/19/2017, 03/03/2018, 03/17/2019, 04/09/2020, 04/18/2021    PFIZER Comirnaty(Gray Top)Covid-19 Tri-Sucrose Vaccine 06/11/2019, 07/02/2019, 07/04/2019, 07/18/2019, 03/01/2020, 11/15/2020   PFIZER(Purple Top)SARS-COV-2 Vaccination 06/11/2019, 07/04/2019, 07/18/2019, 03/01/2020   Pneumococcal Conjugate-13 02/11/2020   Pneumococcal Polysaccharide-23 12/10/2011, 01/30/2018   Pneumococcal-Unspecified 03/26/2016, 02/04/2018   Tdap 01/17/2016, 02/04/2018   Zoster Recombinat (Shingrix) 08/14/2020, 11/29/2020    Family History  Problem Relation Age of Onset   Pancreatic cancer Mother    Bone cancer Father    Alzheimer's disease Sister    Heart attack Paternal Uncle    Arthritis Maternal Grandfather    Alzheimer's disease Paternal Grandmother    Lung cancer Paternal Grandfather      Current Outpatient Medications:    acetaminophen (TYLENOL) 500 MG tablet, Take 500 mg by mouth every 6 (six) hours as needed for moderate pain., Disp: , Rfl:    allopurinol (ZYLOPRIM) 100 MG tablet, Take 200 mg by mouth daily., Disp: , Rfl:    aspirin EC 81 MG tablet, Take 1 tablet (81 mg total) by mouth daily., Disp: 30 tablet, Rfl: 11   diltiazem (CARDIZEM CD) 120 MG 24 hr capsule, Take 1 capsule (120 mg total) by mouth daily. Please schedule appointment for further refills, Disp: 30 capsule, Rfl: 1   docusate sodium (COLACE) 100 MG capsule, Take 100 mg by mouth 2 (two) times daily.,  Disp: , Rfl:    gabapentin (NEURONTIN) 300 MG capsule, Take 300 mg by mouth at bedtime., Disp: , Rfl:    glimepiride (AMARYL) 2 MG tablet, Take 2 mg by mouth every morning., Disp: , Rfl:    hydrochlorothiazide (HYDRODIURIL) 25 MG tablet, Take 0.5 tablets (12.5 mg total) by mouth daily as needed., Disp: 45 tablet, Rfl: 3   leflunomide (ARAVA) 10 MG tablet, Take 10 mg by mouth daily., Disp: , Rfl:    nebivolol (BYSTOLIC) 10 MG tablet, Take 10 mg by mouth daily., Disp: , Rfl:    nitroGLYCERIN (NITROSTAT) 0.4 MG SL tablet, Place 1 tablet (0.4 mg total) under the tongue every 5 (five) minutes as  needed for chest pain., Disp: 75 tablet, Rfl: 2   Omega-3 Fatty Acids (OMEGA-3 FISH OIL PO), Take 1 capsule by mouth daily., Disp: , Rfl:    pantoprazole (PROTONIX) 40 MG tablet, Take 40 mg by mouth daily., Disp: , Rfl:    Pirfenidone (ESBRIET) 267 MG TABS, Take 2 tablets (534 mg total) by mouth 3 (three) times daily with meals. **NOTE LOW DOSE due to kidney function**, Disp: 180 tablet, Rfl: 5   pravastatin (PRAVACHOL) 20 MG tablet, Take 1 tablet (20 mg total) by mouth every evening., Disp: 90 tablet, Rfl: 3   rivaroxaban (XARELTO) 20 MG TABS tablet, TAKE 1 TABLET DAILY WITH SUPPER, Disp: 90 tablet, Rfl: 3   telmisartan (MICARDIS) 80 MG tablet, Take 1 tablet (80 mg total) by mouth daily., Disp: 90 tablet, Rfl: 3   terbinafine (LAMISIL) 1 % cream, Apply twice daily to buttocks and left thigh rash until clear, Disp: 42 g, Rfl: 2   thyroid (ARMOUR) 90 MG tablet, Take 90 mg by mouth every morning. , Disp: , Rfl:    traZODone (DESYREL) 50 MG tablet, Take 1 tablet by mouth at bedtime., Disp: , Rfl:    triamcinolone ointment (KENALOG) 0.1 %, Apply twice daily to arms up to 2 weeks as needed. Avoid applying to face, groin, and axilla., Disp: 80 g, Rfl: 2   Trospium Chloride 60 MG CP24, TAKE 1 CAPSULE BY MOUTH DAILY, Disp: 30 capsule, Rfl: 2   VTAMA 1 % CREA, APPLY 1 APPLICATION TOPICALLY DAILY, Disp: 60 g, Rfl: 5  Current Facility-Administered Medications:    sodium chloride flush (NS) 0.9 % injection 3 mL, 3 mL, Intravenous, Q12H, Troy Sine, MD  Facility-Administered Medications Ordered in Other Visits:    ondansetron (ZOFRAN) 4 mg in sodium chloride 0.9 % 50 mL IVPB, 4 mg, Intravenous, Q6H PRN, Ashok Pall, MD      Objective:   Vitals:   05/06/22 1613  BP: 120/68  Pulse: 87  Temp: 97.8 F (36.6 C)  TempSrc: Oral  SpO2: 95%  Weight: 173 lb 9.6 oz (78.7 kg)  Height: _0  (1.702 m)    Estimated body mass index is 27.19 kg/m as calculated from the following:   Height as of this  encounter: _1  (1.702 m).   Weight as of this encounter: 173 lb 9.6 oz (78.7 kg).  _2 @  Filed Weights   05/06/22 1613  Weight: 173 lb 9.6 oz (78.7 kg)     Physical Exam   General: No distress. Loks well but lost weight Neuro: Alert and Oriented x 3. GCS 15. Speech normal Psych: Pleasant Resp:  Barrel Chest - no.  Wheeze - no, Crackles - yes base, No overt respiratory distress CVS: Normal heart sounds. Murmurs - no Ext: Stigmata of Connective Tissue Disease -  RA HEENT: Normal upper airway. PEERL +. No post nasal drip        Assessment:       ICD-10-CM   1. ILD (interstitial lung disease) (HCC)  O06.9 Basic metabolic panel    Hepatic function panel    Pulmonary function test    2. 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 Encounter for drug therapy  Z79.899     4. H/O agent Orange exposure  Z77.098     5. Dysgeusia  R43.2     6. Unintentional weight loss  R63.4          Plan:     Patient Instructions  Interstitial lung disease due to connective tissue disease ([place]) History of rheumatoid arthritis and immunosuppressed on leflunomide Hx of Agent Orange Exposure   - Pulmonary Fibrosis  overall progressive. REcently stable.  Hx of covid March 2023 - did not affect your fibrosis  Plan - check LFT ,bmet 05/06/2022 or anytimex next 2-3 weeks - take esbriet holiday for 2 weeks and see if weight/taste improve - restart esbriet after 2 weeks  - 1 pill three times daily x 1 week and then 2 pills three times daily - [eat with food, space adequately between meals and apply sunscreen at all times -Continue daily exercise as before - continue ILD-PRO registry participation as before (last visit 2 months ago) - spirometry and dlco in 3 months   Dysgeusia Unintentioal weight loss  -Could be because of pirfenidone but still currently technically overweight but approaching normal  weight.  Plan - Pirfenidone holiday for 2 weeks and then reassess  Shortness of breath Grade 2 diastolic dysfunction Pulmonary venous hypertension  -curerntly stabkle  Plan  - per Dr Claiborne Billings   Followup - - retunr in 3 months - see Dr [name] - 30 min but after PFT  - symptom score and walk test at followup  High complex medical condition requiring high risk prescription with intensive therapeutic monitoring requirement   SIGNATURE    Dr. Brand Males, M.D., F.C.C.P,  Pulmonary and Critical Care Medicine Staff Physician, [facility] Director - Interstitial Lung Disease  Program  Pulmonary [place] [facility] [place], [place], [address]  Pager: [phone], If no answer or between  15:00h - 7:00h: call [phone] Telephone: [phone]  4:53 PM 05/06/2022

## 2022-05-08 ENCOUNTER — Ambulatory Visit (INDEPENDENT_AMBULATORY_CARE_PROVIDER_SITE_OTHER): Payer: Medicare Other

## 2022-05-08 DIAGNOSIS — I495 Sick sinus syndrome: Secondary | ICD-10-CM

## 2022-05-08 LAB — CUP PACEART REMOTE DEVICE CHECK
Battery Remaining Longevity: 89 mo
Battery Remaining Percentage: 80 %
Battery Voltage: 3.01 V
Brady Statistic AP VP Percent: 94 %
Brady Statistic AP VS Percent: 1 %
Brady Statistic AS VP Percent: 5.9 %
Brady Statistic AS VS Percent: 1 %
Brady Statistic RA Percent Paced: 93 %
Brady Statistic RV Percent Paced: 99 %
Date Time Interrogation Session: 20231214020012
Implantable Lead Connection Status: 753985
Implantable Lead Connection Status: 753985
Implantable Lead Implant Date: 20130716
Implantable Lead Implant Date: 20220317
Implantable Lead Location: 753859
Implantable Lead Location: 753860
Implantable Lead Model: 1948
Implantable Lead Model: 5076
Implantable Pulse Generator Implant Date: 20220317
Lead Channel Impedance Value: 390 Ohm
Lead Channel Impedance Value: 540 Ohm
Lead Channel Pacing Threshold Amplitude: 0.375 V
Lead Channel Pacing Threshold Amplitude: 0.5 V
Lead Channel Pacing Threshold Pulse Width: 0.5 ms
Lead Channel Pacing Threshold Pulse Width: 0.5 ms
Lead Channel Sensing Intrinsic Amplitude: 12 mV
Lead Channel Sensing Intrinsic Amplitude: 2.1 mV
Lead Channel Setting Pacing Amplitude: 0.625
Lead Channel Setting Pacing Amplitude: 2 V
Lead Channel Setting Pacing Pulse Width: 0.5 ms
Lead Channel Setting Sensing Sensitivity: 7 mV
Pulse Gen Model: 2272
Pulse Gen Serial Number: 3908614

## 2022-05-21 ENCOUNTER — Other Ambulatory Visit (INDEPENDENT_AMBULATORY_CARE_PROVIDER_SITE_OTHER): Payer: Medicare Other

## 2022-05-21 DIAGNOSIS — J849 Interstitial pulmonary disease, unspecified: Secondary | ICD-10-CM | POA: Diagnosis not present

## 2022-05-21 LAB — HEPATIC FUNCTION PANEL
ALT: 11 U/L (ref 0–53)
AST: 14 U/L (ref 0–37)
Albumin: 3.6 g/dL (ref 3.5–5.2)
Alkaline Phosphatase: 59 U/L (ref 39–117)
Bilirubin, Direct: 0.1 mg/dL (ref 0.0–0.3)
Total Bilirubin: 0.4 mg/dL (ref 0.2–1.2)
Total Protein: 6.4 g/dL (ref 6.0–8.3)

## 2022-05-21 LAB — BASIC METABOLIC PANEL
BUN: 49 mg/dL — ABNORMAL HIGH (ref 6–23)
CO2: 27 mEq/L (ref 19–32)
Calcium: 9.3 mg/dL (ref 8.4–10.5)
Chloride: 106 mEq/L (ref 96–112)
Creatinine, Ser: 1.41 mg/dL (ref 0.40–1.50)
GFR: 46.53 mL/min — ABNORMAL LOW (ref >60.00)
Glucose, Bld: 121 mg/dL — ABNORMAL HIGH (ref 70–99)
Potassium: 4.6 mEq/L (ref 3.5–5.1)
Sodium: 139 mEq/L (ref 135–145)

## 2022-05-23 ENCOUNTER — Other Ambulatory Visit: Payer: Self-pay

## 2022-05-23 ENCOUNTER — Inpatient Hospital Stay (HOSPITAL_COMMUNITY)
Admission: EM | Admit: 2022-05-23 | Discharge: 2022-05-30 | DRG: 378 | Disposition: A | Discharge status: Home / Self Care | Payer: Medicare Other | Attending: Internal Medicine | Admitting: Internal Medicine

## 2022-05-23 ENCOUNTER — Telehealth: Payer: Self-pay | Admitting: Cardiovascular Disease

## 2022-05-23 ENCOUNTER — Encounter (HOSPITAL_COMMUNITY): Payer: Self-pay | Admitting: Internal Medicine

## 2022-05-23 DIAGNOSIS — N1832 Chronic kidney disease, stage 3b: Secondary | ICD-10-CM | POA: Diagnosis present

## 2022-05-23 DIAGNOSIS — E1122 Type 2 diabetes mellitus with diabetic chronic kidney disease: Secondary | ICD-10-CM | POA: Diagnosis present

## 2022-05-23 DIAGNOSIS — Z9989 Dependence on other enabling machines and devices: Secondary | ICD-10-CM

## 2022-05-23 DIAGNOSIS — J849 Interstitial pulmonary disease, unspecified: Secondary | ICD-10-CM | POA: Diagnosis present

## 2022-05-23 DIAGNOSIS — I2510 Atherosclerotic heart disease of native coronary artery without angina pectoris: Secondary | ICD-10-CM | POA: Diagnosis present

## 2022-05-23 DIAGNOSIS — I4891 Unspecified atrial fibrillation: Secondary | ICD-10-CM | POA: Diagnosis not present

## 2022-05-23 DIAGNOSIS — M0600 Rheumatoid arthritis without rheumatoid factor, unspecified site: Secondary | ICD-10-CM

## 2022-05-23 DIAGNOSIS — M069 Rheumatoid arthritis, unspecified: Secondary | ICD-10-CM | POA: Diagnosis present

## 2022-05-23 DIAGNOSIS — D500 Iron deficiency anemia secondary to blood loss (chronic): Secondary | ICD-10-CM | POA: Insufficient documentation

## 2022-05-23 DIAGNOSIS — R509 Fever, unspecified: Secondary | ICD-10-CM | POA: Diagnosis not present

## 2022-05-23 DIAGNOSIS — Z801 Family history of malignant neoplasm of trachea, bronchus and lung: Secondary | ICD-10-CM

## 2022-05-23 DIAGNOSIS — I442 Atrioventricular block, complete: Secondary | ICD-10-CM | POA: Diagnosis present

## 2022-05-23 DIAGNOSIS — Z87891 Personal history of nicotine dependence: Secondary | ICD-10-CM

## 2022-05-23 DIAGNOSIS — Z807 Family history of other malignant neoplasms of lymphoid, hematopoietic and related tissues: Secondary | ICD-10-CM

## 2022-05-23 DIAGNOSIS — E785 Hyperlipidemia, unspecified: Secondary | ICD-10-CM | POA: Diagnosis present

## 2022-05-23 DIAGNOSIS — J449 Chronic obstructive pulmonary disease, unspecified: Secondary | ICD-10-CM | POA: Diagnosis present

## 2022-05-23 DIAGNOSIS — Z9049 Acquired absence of other specified parts of digestive tract: Secondary | ICD-10-CM

## 2022-05-23 DIAGNOSIS — Z96651 Presence of right artificial knee joint: Secondary | ICD-10-CM | POA: Diagnosis present

## 2022-05-23 DIAGNOSIS — J069 Acute upper respiratory infection, unspecified: Secondary | ICD-10-CM | POA: Diagnosis present

## 2022-05-23 DIAGNOSIS — I10 Essential (primary) hypertension: Secondary | ICD-10-CM

## 2022-05-23 DIAGNOSIS — E1165 Type 2 diabetes mellitus with hyperglycemia: Secondary | ICD-10-CM | POA: Diagnosis present

## 2022-05-23 DIAGNOSIS — I129 Hypertensive chronic kidney disease with stage 1 through stage 4 chronic kidney disease, or unspecified chronic kidney disease: Secondary | ICD-10-CM | POA: Diagnosis present

## 2022-05-23 DIAGNOSIS — Z955 Presence of coronary angioplasty implant and graft: Secondary | ICD-10-CM

## 2022-05-23 DIAGNOSIS — Z8249 Family history of ischemic heart disease and other diseases of the circulatory system: Secondary | ICD-10-CM

## 2022-05-23 DIAGNOSIS — Z7984 Long term (current) use of oral hypoglycemic drugs: Secondary | ICD-10-CM

## 2022-05-23 DIAGNOSIS — N401 Enlarged prostate with lower urinary tract symptoms: Secondary | ICD-10-CM | POA: Diagnosis not present

## 2022-05-23 DIAGNOSIS — E039 Hypothyroidism, unspecified: Secondary | ICD-10-CM | POA: Diagnosis present

## 2022-05-23 DIAGNOSIS — K922 Gastrointestinal hemorrhage, unspecified: Secondary | ICD-10-CM | POA: Diagnosis not present

## 2022-05-23 DIAGNOSIS — K315 Obstruction of duodenum: Secondary | ICD-10-CM | POA: Diagnosis present

## 2022-05-23 DIAGNOSIS — Z950 Presence of cardiac pacemaker: Secondary | ICD-10-CM

## 2022-05-23 DIAGNOSIS — K5909 Other constipation: Secondary | ICD-10-CM | POA: Diagnosis present

## 2022-05-23 DIAGNOSIS — Z1152 Encounter for screening for COVID-19: Secondary | ICD-10-CM

## 2022-05-23 DIAGNOSIS — D62 Acute posthemorrhagic anemia: Secondary | ICD-10-CM | POA: Diagnosis not present

## 2022-05-23 DIAGNOSIS — Z7901 Long term (current) use of anticoagulants: Secondary | ICD-10-CM

## 2022-05-23 DIAGNOSIS — Z8261 Family history of arthritis: Secondary | ICD-10-CM

## 2022-05-23 DIAGNOSIS — D649 Anemia, unspecified: Secondary | ICD-10-CM | POA: Insufficient documentation

## 2022-05-23 DIAGNOSIS — Z888 Allergy status to other drugs, medicaments and biological substances status: Secondary | ICD-10-CM

## 2022-05-23 DIAGNOSIS — R1310 Dysphagia, unspecified: Secondary | ICD-10-CM

## 2022-05-23 DIAGNOSIS — Z7982 Long term (current) use of aspirin: Secondary | ICD-10-CM

## 2022-05-23 DIAGNOSIS — Z87442 Personal history of urinary calculi: Secondary | ICD-10-CM

## 2022-05-23 DIAGNOSIS — Z86010 Personal history of colonic polyps: Secondary | ICD-10-CM

## 2022-05-23 DIAGNOSIS — Z9079 Acquired absence of other genital organ(s): Secondary | ICD-10-CM

## 2022-05-23 DIAGNOSIS — K648 Other hemorrhoids: Secondary | ICD-10-CM | POA: Diagnosis present

## 2022-05-23 DIAGNOSIS — Z961 Presence of intraocular lens: Secondary | ICD-10-CM | POA: Diagnosis present

## 2022-05-23 DIAGNOSIS — Z79899 Other long term (current) drug therapy: Secondary | ICD-10-CM

## 2022-05-23 DIAGNOSIS — R195 Other fecal abnormalities: Secondary | ICD-10-CM

## 2022-05-23 DIAGNOSIS — R350 Frequency of micturition: Secondary | ICD-10-CM

## 2022-05-23 DIAGNOSIS — Z79890 Hormone replacement therapy (postmenopausal): Secondary | ICD-10-CM

## 2022-05-23 DIAGNOSIS — K5731 Diverticulosis of large intestine without perforation or abscess with bleeding: Principal | ICD-10-CM | POA: Diagnosis present

## 2022-05-23 DIAGNOSIS — D539 Nutritional anemia, unspecified: Secondary | ICD-10-CM | POA: Diagnosis present

## 2022-05-23 DIAGNOSIS — K222 Esophageal obstruction: Secondary | ICD-10-CM | POA: Diagnosis present

## 2022-05-23 DIAGNOSIS — R531 Weakness: Secondary | ICD-10-CM

## 2022-05-23 DIAGNOSIS — L4050 Arthropathic psoriasis, unspecified: Secondary | ICD-10-CM | POA: Diagnosis present

## 2022-05-23 DIAGNOSIS — G4733 Obstructive sleep apnea (adult) (pediatric): Secondary | ICD-10-CM | POA: Diagnosis present

## 2022-05-23 DIAGNOSIS — Z800 Family history of malignant neoplasm of digestive organs: Secondary | ICD-10-CM

## 2022-05-23 LAB — URINALYSIS, ROUTINE W REFLEX MICROSCOPIC
Bacteria, UA: NONE SEEN
Bilirubin Urine: NEGATIVE
Glucose, UA: >=500 mg/dL — AB
Hgb urine dipstick: NEGATIVE
Ketones, ur: NEGATIVE mg/dL
Leukocytes,Ua: NEGATIVE
Nitrite: NEGATIVE
Protein, ur: NEGATIVE mg/dL
Specific Gravity, Urine: 1.012 (ref 1.005–1.030)
pH: 5 (ref 5.0–8.0)

## 2022-05-23 LAB — IRON AND TIBC
Iron: 41 ug/dL — ABNORMAL LOW (ref 45–182)
Saturation Ratios: 11 % — ABNORMAL LOW (ref 17.9–39.5)
TIBC: 363 ug/dL (ref 250–450)
UIBC: 322 ug/dL

## 2022-05-23 LAB — RETICULOCYTES
Immature Retic Fract: 28.2 % — ABNORMAL HIGH (ref 2.3–15.9)
RBC.: 2.3 MIL/uL — ABNORMAL LOW (ref 4.22–5.81)
Retic Count, Absolute: 171.3 10*3/uL (ref 19.0–186.0)
Retic Ct Pct: 7.5 % — ABNORMAL HIGH (ref 0.4–3.1)

## 2022-05-23 LAB — TROPONIN I (HIGH SENSITIVITY)
Troponin I (High Sensitivity): 9 ng/L (ref <18)
Troponin I (High Sensitivity): 11 ng/L (ref <18)

## 2022-05-23 LAB — BASIC METABOLIC PANEL
Anion gap: 10 (ref 5–15)
BUN: 48 mg/dL — ABNORMAL HIGH (ref 8–23)
CO2: 20 mmol/L — ABNORMAL LOW (ref 22–32)
Calcium: 8.7 mg/dL — ABNORMAL LOW (ref 8.9–10.3)
Chloride: 109 mmol/L (ref 98–111)
Creatinine, Ser: 1.22 mg/dL (ref 0.61–1.24)
GFR, Estimated: 59 mL/min — ABNORMAL LOW (ref >60)
Glucose, Bld: 138 mg/dL — ABNORMAL HIGH (ref 70–99)
Potassium: 4.6 mmol/L (ref 3.5–5.1)
Sodium: 139 mmol/L (ref 135–145)

## 2022-05-23 LAB — ABO/RH: ABO/RH(D): A POS

## 2022-05-23 LAB — CBC
HCT: 19.9 % — ABNORMAL LOW (ref 39.0–52.0)
Hemoglobin: 6 g/dL — CL (ref 13.0–17.0)
MCH: 31.7 pg (ref 26.0–34.0)
MCHC: 30.2 g/dL (ref 30.0–36.0)
MCV: 105.3 fL — ABNORMAL HIGH (ref 80.0–100.0)
Platelets: 172 10*3/uL (ref 150–400)
RBC: 1.89 MIL/uL — ABNORMAL LOW (ref 4.22–5.81)
RDW: 16.4 % — ABNORMAL HIGH (ref 11.5–15.5)
WBC: 8.7 10*3/uL (ref 4.0–10.5)
nRBC: 0 % (ref 0.0–0.2)

## 2022-05-23 LAB — FERRITIN: Ferritin: 51 ng/mL (ref 24–336)

## 2022-05-23 LAB — RESP PANEL BY RT-PCR (RSV, FLU A&B, COVID)  RVPGX2
Influenza A by PCR: NEGATIVE
Influenza B by PCR: NEGATIVE
Resp Syncytial Virus by PCR: NEGATIVE
SARS Coronavirus 2 by RT PCR: NEGATIVE

## 2022-05-23 LAB — CBG MONITORING, ED
Glucose-Capillary: 142 mg/dL — ABNORMAL HIGH (ref 70–99)
Glucose-Capillary: 136 mg/dL — ABNORMAL HIGH (ref 70–99)

## 2022-05-23 LAB — POC OCCULT BLOOD, ED: Fecal Occult Bld: POSITIVE — AB

## 2022-05-23 LAB — GLUCOSE, CAPILLARY
Glucose-Capillary: 134 mg/dL — ABNORMAL HIGH (ref 70–99)
Glucose-Capillary: 125 mg/dL — ABNORMAL HIGH (ref 70–99)

## 2022-05-23 LAB — PREPARE RBC (CROSSMATCH)

## 2022-05-23 LAB — FOLATE: Folate: 19.1 ng/mL (ref >5.9)

## 2022-05-23 LAB — VITAMIN B12: Vitamin B-12: 260 pg/mL (ref 180–914)

## 2022-05-23 MED ORDER — GABAPENTIN 300 MG PO CAPS
300.0000 mg | ORAL_CAPSULE | Freq: Every day | ORAL | Status: DC
  Administered 2022-05-23 – 2022-05-29 (×7): 300 mg via ORAL
  Filled 2022-05-23 (×7): qty 1

## 2022-05-23 MED ORDER — PIRFENIDONE 267 MG PO TABS
267.0000 mg | ORAL_TABLET | Freq: Three times a day (TID) | ORAL | Status: DC
  Administered 2022-05-28 – 2022-05-30 (×7): 267 mg via ORAL
  Filled 2022-05-23 (×7): qty 1

## 2022-05-23 MED ORDER — SODIUM CHLORIDE 0.9% IV SOLUTION: Freq: Once | INTRAVENOUS | Status: DC

## 2022-05-23 MED ORDER — LEFLUNOMIDE 10 MG PO TABS
10.0000 mg | ORAL_TABLET | Freq: Every day | ORAL | Status: DC
  Administered 2022-05-24 – 2022-05-30 (×7): 10 mg via ORAL
  Filled 2022-05-23 (×7): qty 1

## 2022-05-23 MED ORDER — INSULIN ASPART 100 UNIT/ML IJ SOLN
0.0000 [IU] | Freq: Three times a day (TID) | INTRAMUSCULAR | Status: DC
  Administered 2022-05-23 – 2022-05-24 (×2): 1 [IU] via SUBCUTANEOUS
  Administered 2022-05-26: 2 [IU] via SUBCUTANEOUS
  Administered 2022-05-26 (×2): 1 [IU] via SUBCUTANEOUS
  Administered 2022-05-27 – 2022-05-28 (×2): 2 [IU] via SUBCUTANEOUS
  Administered 2022-05-28: 1 [IU] via SUBCUTANEOUS
  Administered 2022-05-28: 2 [IU] via SUBCUTANEOUS
  Administered 2022-05-29: 1 [IU] via SUBCUTANEOUS
  Administered 2022-05-29: 2 [IU] via SUBCUTANEOUS
  Administered 2022-05-30: 1 [IU] via SUBCUTANEOUS
  Administered 2022-05-30: 3 [IU] via SUBCUTANEOUS

## 2022-05-23 MED ORDER — NEBIVOLOL HCL 10 MG PO TABS
10.0000 mg | ORAL_TABLET | Freq: Every day | ORAL | Status: DC
  Administered 2022-05-24 – 2022-05-30 (×7): 10 mg via ORAL
  Filled 2022-05-23 (×7): qty 1

## 2022-05-23 MED ORDER — ACETAMINOPHEN 650 MG RE SUPP: 650.0000 mg | Freq: Four times a day (QID) | RECTAL | Status: DC | PRN

## 2022-05-23 MED ORDER — IRBESARTAN 300 MG PO TABS
300.0000 mg | ORAL_TABLET | Freq: Every day | ORAL | Status: DC
  Administered 2022-05-24 – 2022-05-26 (×3): 300 mg via ORAL
  Filled 2022-05-23 (×3): qty 1

## 2022-05-23 MED ORDER — PRAVASTATIN SODIUM 10 MG PO TABS
20.0000 mg | ORAL_TABLET | Freq: Every evening | ORAL | Status: DC
  Administered 2022-05-23 – 2022-05-29 (×7): 20 mg via ORAL
  Filled 2022-05-23 (×7): qty 2

## 2022-05-23 MED ORDER — SODIUM CHLORIDE 0.9% FLUSH
3.0000 mL | Freq: Two times a day (BID) | INTRAVENOUS | Status: DC
  Administered 2022-05-23 – 2022-05-30 (×12): 3 mL via INTRAVENOUS

## 2022-05-23 MED ORDER — PANTOPRAZOLE SODIUM 40 MG IV SOLR
40.0000 mg | Freq: Two times a day (BID) | INTRAVENOUS | Status: DC
  Administered 2022-05-23 – 2022-05-27 (×8): 40 mg via INTRAVENOUS
  Filled 2022-05-23 (×8): qty 10

## 2022-05-23 MED ORDER — THYROID 60 MG PO TABS
90.0000 mg | ORAL_TABLET | ORAL | Status: DC
  Administered 2022-05-24 – 2022-05-30 (×7): 90 mg via ORAL
  Filled 2022-05-23 (×8): qty 1

## 2022-05-23 MED ORDER — ACETAMINOPHEN 325 MG PO TABS
650.0000 mg | ORAL_TABLET | Freq: Four times a day (QID) | ORAL | Status: DC | PRN
  Administered 2022-05-25 – 2022-05-26 (×2): 650 mg via ORAL
  Filled 2022-05-23 (×2): qty 2

## 2022-05-23 MED ORDER — DILTIAZEM HCL ER COATED BEADS 120 MG PO CP24
120.0000 mg | ORAL_CAPSULE | Freq: Every day | ORAL | Status: DC
  Administered 2022-05-24 – 2022-05-30 (×7): 120 mg via ORAL
  Filled 2022-05-23 (×7): qty 1

## 2022-05-23 MED ORDER — PANTOPRAZOLE 80MG IVPB - SIMPLE MED
80.0000 mg | Freq: Once | INTRAVENOUS | Status: AC
  Administered 2022-05-23: 80 mg via INTRAVENOUS
  Filled 2022-05-23 (×2): qty 100

## 2022-05-23 NOTE — ED Notes (Signed)
ED TO INPATIENT HANDOFF REPORT  ED Nurse Name and Phone #: Nichlos Kunzler RN [phone] Name/Age/Gender Cameron [name] 81 y.o. male Room/Bed: 011C/011C  Code Status   Code Status: Full Code  Home/SNF/Other Home Patient oriented to: self, place, time, and situation Is this baseline? Yes   Triage Complete: Triage complete  Chief Complaint GI bleed [K92.2]  Triage Note Pt. Stated, Donnald Garre had some whoozingness and weakness during the past week. I've also had some chest tightness. EMS stated, he has had some tarry stool for the last couple of days. IV left posterior hand.   Allergies Allergies  Allergen Reactions   Ace Inhibitors Swelling    Tongue swelling, angioedema   Lisinopril     Other reaction(s): Lip swelling, O/E - lip swelling   Celecoxib Rash    Skin rash     Level of Care/Admitting Diagnosis ED Disposition     ED Disposition  Admit   Condition  --   Comment  Hospital Area: [facility] [[id number]]  Level of Care: Telemetry Medical [104]  May place patient in observation at [facility] or [facility] if equivalent level of care is available:: No  Covid Evaluation: Symptomatic Person Under Investigation (PUI) or recent exposure (last 10 days) *Testing Required*  Diagnosis: GI bleed [793903]  Admitting Physician: Marcelyn Bruins [[id number]]  Attending Physician: Marcelyn Bruins [[id number]]          B Medical/Surgery History Past Medical History:  Diagnosis Date   Appendicitis    Atrial fibrillation ([place]) 12/12/2013   BPH (benign prostatic hypertrophy)    CAD (coronary artery disease) 12/2015   Cath by Dr [name] reveals distal and small vessel CAD.  Medical therapy advised.   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  AHI was 20/h while supine.  Oxygen saturations dropped to 87%.  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   Rheumatoid arthritis ([place])    "hands" (08/11/2017)   Type II diabetes mellitus ([place])    Past Surgical History:  Procedure Laterality Date   BACK SURGERY     CARDIAC CATHETERIZATION N/A 12/28/2015   Procedure: Left Heart Cath and Coronary Angiography;  Surgeon: Belva Crome, MD;  Location: [facility];  Service: Cardiovascular;  Laterality: N/A;   CATARACT EXTRACTION W/ INTRAOCULAR LENS  IMPLANT, BILATERAL Bilateral    CORONARY ANGIOPLASTY WITH STENT PLACEMENT  08/11/2017   "2 stents"   CORONARY STENT INTERVENTION N/A 08/11/2017   Procedure: CORONARY STENT INTERVENTION;  Surgeon: Troy Sine, MD;  Location: [facility];  Service: Cardiovascular;  Laterality: N/A;   CYSTOSCOPY W/ STONE MANIPULATION     INGUINAL HERNIA REPAIR Left    INSERT / REPLACE / REMOVE PACEMAKER  12/09/2011   SJM Accent DR RF implanted by DR Allred for complete heart block and syncope   JOINT REPLACEMENT     LAPAROSCOPIC CHOLECYSTECTOMY     LEAD REVISION/REPAIR N/A 08/09/2020   Procedure: LEAD REVISION/REPAIR;  Surgeon: [name], MD;  Location: [facility];  Service: Cardiovascular;  Laterality: N/A;   LITHOTRIPSY     LUMBAR [place]     "removed arthritis  and spurs"   PERMANENT PACEMAKER INSERTION N/A 12/09/2011   Procedure: PERMANENT PACEMAKER INSERTION;  Surgeon: [name], MD;  Location: [facility] CATH LAB;  Service: Cardiovascular;  Laterality: N/A;   PPM GENERATOR CHANGEOUT N/A 08/09/2020   Procedure: PPM GENERATOR CHANGEOUT;  Surgeon: [name], MD;  Location: [facility];  Service: Cardiovascular;  Laterality: N/A;   REPLACEMENT TOTAL KNEE Right    RIGHT HEART CATH N/A 05/29/2021   Procedure: RIGHT HEART CATH;  Surgeon: Troy Sine, MD;  Location: [facility];  Service: Cardiovascular;  Laterality: N/A;   RIGHT/LEFT HEART CATH AND CORONARY ANGIOGRAPHY N/A 08/11/2017   Procedure: RIGHT/LEFT HEART CATH AND CORONARY ANGIOGRAPHY;  Surgeon: Larey Dresser, MD;  Location: [facility];  Service: Cardiovascular;  Laterality: N/A;   TRANSURETHRAL RESECTION OF PROSTATE  2017/2018     A IV Location/Drains/Wounds Patient Lines/Drains/Airways Status     Active Line/Drains/Airways     Name Placement date Placement time Site Days   Peripheral IV 01/24/22 20 G Left Antecubital 01/24/22  1342  Antecubital  119   Peripheral IV 05/23/22 20 G Left;Posterior;Distal Forearm 05/23/22  1405  Forearm  less than 1   Peripheral IV 05/23/22 20 G Left;Posterior Hand 05/23/22  1248  Hand  less than 1   Incision 12/09/11 Chest Left 12/09/11  1845  -- 3818   Wound 12/09/11 [place] [facility] Left 12/09/11  --  Eye  3818            Intake/Output Last 24 hours  Intake/Output Summary (Last 24 hours) at 05/23/2022 1744 Last data filed at 05/23/2022 1733 Gross per 24 hour  Intake --  Output 300 ml  Net -300 ml    Labs/Imaging Results for orders placed or performed during the hospital encounter of 05/23/22 (from the past 48 hour(s))  POC occult blood, ED     Status: Abnormal   Collection Time: 05/23/22 11:25 AM  Result Value Ref Range   Fecal Occult Bld POSITIVE (A) NEGATIVE  CBG monitoring, ED     Status: Abnormal   Collection Time:  05/23/22 11:46 AM  Result Value Ref Range   Glucose-Capillary 142 (H) 70 - 99 mg/dL    Comment: Glucose reference range applies only to samples taken after fasting for at least 8 hours.  Type and screen     Status: None (Preliminary result)   Collection Time: 05/23/22 12:12 PM  Result Value Ref Range   ABO/RH(D) A POS    Antibody Screen NEG    Sample Expiration 05/26/2022,2359    Unit Number [id number]    Blood Component Type RED CELLS,LR    Unit division 00    Status of Unit ALLOCATED    Transfusion Status OK TO TRANSFUSE    Crossmatch Result Compatible    Unit Number [id number]    Blood Component Type RED CELLS,LR    Unit  division 00    Status of Unit ISSUED    Transfusion Status OK TO TRANSFUSE    Crossmatch Result      Compatible Performed at [facility], [place] [address] [place], [place] [address]   Basic metabolic panel     Status: Abnormal   Collection Time: 05/23/22 12:16 PM  Result Value Ref Range   Sodium 139 135 - 145 mmol/L   Potassium 4.6 3.5 - 5.1 mmol/L   Chloride 109 98 - 111 mmol/L   CO2 20 (L) 22 - 32 mmol/L   Glucose, Bld 138 (H) 70 - 99 mg/dL    Comment: Glucose reference range applies only to samples taken after fasting for at least 8 hours.   BUN 48 (H) 8 - 23 mg/dL   Creatinine, Ser 1.22 0.61 - 1.24 mg/dL   Calcium 8.7 (L) 8.9 - 10.3 mg/dL   GFR, Estimated 59 (L) >60 mL/min    Comment: (NOTE) Calculated using the CKD-EPI Creatinine Equation (2021)    Anion gap 10 5 - 15    Comment: Performed at [facility] [address] [place], [place] [address]  CBC     Status: Abnormal   Collection Time: 05/23/22 12:16 PM  Result Value Ref Range   WBC 8.7 4.0 - 10.5 K/uL   RBC 1.89 (L) 4.22 - 5.81 MIL/uL   Hemoglobin 6.0 (LL) 13.0 - 17.0 g/dL    Comment: REPEATED TO VERIFY THIS CRITICAL RESULT HAS VERIFIED AND BEEN CALLED TO A [place] RN BY MELISSA BOOSTEDT ON 12 29 2023 AT 1229, AND HAS BEEN READ BACK.     HCT 19.9 (L) 39.0 - 52.0 %    MCV 105.3 (H) 80.0 - 100.0 fL   MCH 31.7 26.0 - 34.0 pg   MCHC 30.2 30.0 - 36.0 g/dL   RDW 16.4 (H) 11.5 - 15.5 %   Platelets 172 150 - 400 K/uL   nRBC 0.0 0.0 - 0.2 %    Comment: Performed at [facility] [address] [place], [place] [address]  Troponin I (High Sensitivity)     Status: None   Collection Time: 05/23/22 12:16 PM  Result Value Ref Range   Troponin I (High Sensitivity) 9 <18 ng/L    Comment: (NOTE) Elevated high sensitivity troponin I (hsTnI) values and significant  changes across serial measurements may suggest ACS but many other  chronic and acute conditions are known to elevate hsTnI results.  Refer to the "Links" section for chest pain algorithms and additional  guidance. Performed at [facility], [place] [address], [place], [place] [address]   Prepare RBC (crossmatch)     Status: None   Collection Time: 05/23/22 12:40 PM  Result Value Ref Range   Order Confirmation      ORDER PROCESSED BY BLOOD BANK Performed at [facility], [place] [address] [place], [place] [address]   ABO/Rh     Status: None   Collection Time: 05/23/22  1:20 PM  Result Value Ref Range   ABO/RH(D)      A POS Performed at [facility] [address], [place],  [address]   Folate     Status: None   Collection Time: 05/23/22  2:05 PM  Result Value Ref Range   Folate 19.1 >5.9 ng/mL    Comment: Performed at [facility], [place] [address] [place], [place] [address]  Troponin I (High Sensitivity)  Status: None   Collection Time: 05/23/22  2:06 PM  Result Value Ref Range   Troponin I (High Sensitivity) 11 <18 ng/L    Comment: (NOTE) Elevated high sensitivity troponin I (hsTnI) values and significant  changes across serial measurements may suggest ACS but many other  chronic and acute conditions are known to elevate hsTnI results.  Refer to the "Links" section for chest pain algorithms and additional  guidance. Performed at [facility], [place] [address] [place], [place] [address]   Vitamin B12     Status: None   Collection Time: 05/23/22  2:06 PM  Result Value Ref Range   Vitamin B-12 260 180 - 914 pg/mL    Comment: (NOTE) This assay is not validated for testing neonatal or myeloproliferative syndrome specimens for Vitamin B12 levels. Performed at [facility], [place] [address], [place], [place] [address]   Iron and TIBC     Status: Abnormal   Collection Time: 05/23/22  2:06 PM  Result Value Ref Range   Iron 41 (L) 45 - 182 ug/dL   TIBC 363 250 - 450 ug/dL   Saturation Ratios 11 (L) 17.9 - 39.5 %   UIBC 322 ug/dL    Comment: Performed at [facility], [place] [address], [place], [place] [address]  Ferritin     Status: None   Collection Time: 05/23/22  2:06 PM  Result Value Ref Range   Ferritin 51 24 - 336 ng/mL    Comment: Performed at [facility] [address], [place], [place] [address]  Reticulocytes     Status: Abnormal   Collection Time: 05/23/22  2:06 PM  Result Value Ref Range   Retic Ct Pct 7.5 (H) 0.4 - 3.1 %   RBC. 2.30 (L) 4.22 - 5.81 MIL/uL   Retic Count, Absolute 171.3 19.0 - 186.0 K/uL   Immature Retic Fract 28.2 (H) 2.3 - 15.9 %    Comment: Performed at [facility] [address], [place],  [address]  Resp panel by RT-PCR (RSV, Flu A&B, Covid) Anterior Nasal Swab     Status: None   Collection Time: 05/23/22  2:46 PM   Specimen: Anterior Nasal Swab  Result Value Ref Range   SARS Coronavirus 2 by RT PCR NEGATIVE NEGATIVE    Comment: (NOTE) SARS-CoV-2 target nucleic acids are NOT DETECTED.  The SARS-CoV-2 RNA is generally detectable in upper respiratory specimens during the acute phase of infection. The lowest concentration of SARS-CoV-2 viral copies this assay can detect is 138 copies/mL. A negative result does not preclude SARS-Cov-2 infection and should not be used as the sole basis for treatment or other patient management decisions. A negative result may  occur with  improper specimen collection/handling, submission of specimen other than nasopharyngeal swab, presence of viral mutation(s) within the areas targeted by this assay, and inadequate number of viral copies(<138 copies/mL). A negative result must be combined with clinical observations, patient history, and epidemiological information. The expected result is Negative.  Fact Sheet for Patients:  EntrepreneurPulse.com.au  Fact Sheet for Healthcare Providers:  IncredibleEmployment.be  This test is no t yet approved or cleared by the [place] FDA and  has been authorized for detection and/or diagnosis of SARS-CoV-2 by FDA under an Emergency Use Authorization (EUA). This EUA will remain  in effect (meaning this test can be used) for the duration of the COVID-19 declaration under Section 564(b)(1) of the Act, 21 U.S.C.section 360bbb-3(b)(1), unless  the authorization is terminated  or revoked sooner.       Influenza A by PCR NEGATIVE NEGATIVE   Influenza B by PCR NEGATIVE NEGATIVE    Comment: (NOTE) The Xpert Xpress SARS-CoV-2/FLU/RSV plus assay is intended as an aid in the diagnosis of influenza from Nasopharyngeal swab specimens and should not be used as a sole basis for treatment. Nasal washings and aspirates are unacceptable for Xpert Xpress SARS-CoV-2/FLU/RSV testing.  Fact Sheet for Patients: EntrepreneurPulse.com.au  Fact Sheet for Healthcare Providers: IncredibleEmployment.be  This test is not yet approved or cleared by the [place] FDA and has been authorized for detection and/or diagnosis of SARS-CoV-2 by FDA under an Emergency Use Authorization (EUA). This EUA will remain in effect (meaning this test can be used) for the duration of the COVID-19 declaration under Section 564(b)(1) of the Act, 21 U.S.C. section 360bbb-3(b)(1), unless the authorization is terminated or revoked.      Resp Syncytial Virus by PCR NEGATIVE NEGATIVE    Comment: (NOTE) Fact Sheet for Patients: EntrepreneurPulse.com.au  Fact Sheet for Healthcare Providers: IncredibleEmployment.be  This test is not yet approved or cleared by the [place] FDA and has been authorized for detection and/or diagnosis of SARS-CoV-2 by FDA under an Emergency Use Authorization (EUA). This EUA will remain in effect (meaning this test can be used) for the duration of the COVID-19 declaration under Section 564(b)(1) of the Act, 21 U.S.C. section 360bbb-3(b)(1), unless the authorization is terminated or revoked.  Performed at [facility], [place] [address], [place], [place] [address]   CBG monitoring, ED     Status: Abnormal   Collection Time: 05/23/22  5:36 PM  Result Value Ref Range   Glucose-Capillary 136 (H) 70 - 99 mg/dL    Comment: Glucose reference range applies only to samples taken after fasting for at least 8 hours.   No results found.  Pending Labs Unresulted Labs (From admission, onward)     Start     Ordered   05/24/22 0500  Comprehensive metabolic panel  Tomorrow morning,   R        05/23/22 1304   05/23/22 1800  CBC  Now then every 12 hours,   R      05/23/22 1312   05/23/22 1043  Urinalysis, Routine w reflex microscopic Urine, Clean Catch  Once,   URGENT        05/23/22 1042            Vitals/Pain Today's Vitals   05/23/22 1645 05/23/22 1700 05/23/22 1715 05/23/22 1738  BP:  (!) 128/49  (!) 128/51  Pulse: 68 64 60 67  Resp:    17  Temp:    98.1 F (36.7 C)  TempSrc:    Oral  SpO2: 100% 100% 98%   Weight:      Height:      PainSc:        Isolation Precautions No active isolations  Medications Medications  0.9 %  sodium chloride infusion (Manually program via Guardrails IV Fluids) (0 mLs Intravenous Hold 05/23/22 1454)  leflunomide (ARAVA) tablet 10 mg (has no administration in time range)  diltiazem (CARDIZEM CD) 24 hr  capsule 120 mg (has no administration in time range)  nebivolol (BYSTOLIC) tablet 10 mg (has no administration in time range)  irbesartan (AVAPRO) tablet 300 mg (has no administration in time range)  pravastatin (PRAVACHOL) tablet 20 mg (has no administration in time range)  thyroid (ARMOUR) tablet 90 mg (has no administration  in time range)  sodium chloride flush (NS) 0.9 % injection 3 mL (3 mLs Intravenous Given 05/23/22 1448)  acetaminophen (TYLENOL) tablet 650 mg (has no administration in time range)    Or  acetaminophen (TYLENOL) suppository 650 mg (has no administration in time range)  pantoprazole (PROTONIX) injection 40 mg (has no administration in time range)  gabapentin (NEURONTIN) capsule 300 mg (has no administration in time range)  Pirfenidone TABS 267 mg (has no administration in time range)  insulin aspart (novoLOG) injection 0-9 Units (1 Units Subcutaneous Given 05/23/22 1740)  pantoprazole (PROTONIX) 80 mg /NS 100 mL IVPB (0 mg Intravenous Stopped 05/23/22 1445)    Mobility walks Low fall risk   Focused Assessments Cardiac Assessment Handoff:    Lab Results  Component Value Date   CKTOTAL 45 07/30/2017   CKMB 2.4 07/30/2017   TROPONINI < 0.02 12/09/2011   Lab Results  Component Value Date   DDIMER 2.27 (H) 04/02/2017   Does the Patient currently have chest pain? No   , Pulmonary Assessment Handoff:  Lung sounds:   O2 Device: Room Air      R Recommendations: See Admitting Provider Note  Report given to:   Additional Notes:

## 2022-05-23 NOTE — ED Notes (Signed)
Admitting provider at bedside at this time to evaluate patient and update him and wife on plan of care.

## 2022-05-23 NOTE — Telephone Encounter (Signed)
Pt spouse states that pt hasn't been feeling well. She states he has loss of appetite, weak, pale, lower pain pain ("not sure if related to kidneys or not") and a little tightness in his chest. She states pt symptoms started two weeks ago. Please advise.

## 2022-05-23 NOTE — ED Notes (Signed)
Blood consent signed by patient electronically and this RN signed as witness. All risks and benefits of receiving blood transfusion verbalized to patient and patient's wife at bedside. Both verbalized understanding and had no other questions.

## 2022-05-23 NOTE — ED Provider Triage Note (Signed)
Emergency Medicine Provider Triage Evaluation Note  Cameron [name] , a 81 y.o. male  was evaluated in triage.  Pt complains of dizzy. For the past few days pt report lightheadedness/dizzy and not feeling well.  Did note some dark stool a few days ago but none since.  Report mild chest discomfort earlier today.  Is on xarelto.  Denies abd pain, fever, cold sxs  Review of Systems  Positive: As above Negative: As above  Physical Exam  BP (!) 146/54 (BP Location: Right Arm)   Pulse 66   Temp 98.2 F (36.8 C) (Oral)   Resp 16   Ht _0  (1.702 m)   Wt 78 kg   SpO2 97%   BMI 26.94 kg/m  Gen:   Awake, no distress   Resp:  Normal effort  MSK:   Moves extremities without difficulty  Other:    Medical Decision Making  Medically screening exam initiated at 10:42 AM.  Appropriate orders placed.  [name] was informed that the remainder of the evaluation will be completed by another provider, this initial triage assessment does not replace that evaluation, and the importance of remaining in the ED until their evaluation is complete.     Domenic Moras, PA-C 05/23/22 1044

## 2022-05-23 NOTE — Telephone Encounter (Signed)
Patient's wife is returning call. She states they are at [facility] now.

## 2022-05-23 NOTE — Telephone Encounter (Signed)
LMTCB.

## 2022-05-23 NOTE — Progress Notes (Signed)
Pt declined CPAP for tonight.

## 2022-05-23 NOTE — H&P (View-Only) (Signed)
[facility] Consult: 1:02 PM 05/23/2022  LOS: 0 days    Referring Provider: Dr Trilby Drummer  Primary Care Physician:  [name], MD Primary Gastroenterologist:  Dr. Loistine Simas at [facility] in [place].   Scheduled for upcoming colonoscopy 06/10/22 w Dr Andrey Farmer    Reason for Consultation:  Hgb 6, melenic stools.    HPI: DAELON DUNIVAN is a 81 y.o. male.  PMH a fib.  Chronic xarelto.  Heart block, sp pacemaker.  CAD, sp stenting 2019.  OSA, on CPAP.  COPD.  Hypothyroidism.  Psoriatic arthritis.  NIDDM. Sp cholecystectomy.  Elevated LFTs August 2023 with normal T. bili, alk phos 118, AST/ALT 73/94.  On recheck in September, October and mid December these had normalized.  Abdominal ultrasound 12/2021: Diminished echo in upper right kidney, possibly complex renal cyst.  Renal protocol CT recommended.  Gallbladder surgically absent.  CBD 4.2 mm.  Liver unremarkable.  PV Dopplers normal.  01/2022 renal protocol CT showed bilateral renal stones likely accounting for microhematuria.  No obstructing stones in the ureters or bladder.  No worrisome renal or bladder lesions.  Advanced atherosclerotic calcification involving aorta and branch vessels  02/2019 barium esophagram.  For dysphagia to liquids, solids.  Demonstrated mild, smooth distal esophageal stricture proximal to the EG junction which restricted passage of barium tablet. 09/2021 Barium esophagram dysphagia: Moderate dysmotility.  No stricture, reflux, HH.  13 mm tablet passed wo incidence.    NO PRIOR EGDS Adenomatous polyps at 2005 colonoscopy.   2013 colonoscopy: diverticulosis Surveilance colonoscopy placed on hold 2019 due to need for post coronary stent Plavix.   05/2019 Cologuard: negative.   Has colonoscopy scheduled for 06/10/22.  > 2 months  of diminished taste sensation and diminished po due to this.  6 to 8 # wt loss.  >/= 2 weeks fatigue.  Then developed dizziness, weakness but no change in stable dyspnea on exertion.  No palpitations, irregular heart rate, angina.  No nausea, vomiting, abdominal/epigastric pain.  Compliant with Protonix 40 mg po daily.  Last dose Xarelto was yesterday morning 12/28.  The weakness, dizziness became profound the last couple of days.  2 days ago after a bowel movement, he saw some very dark but not bloody remnant of stool.  The stool in the commode did not look abnormal to him.  Last night he had a large bowel movement but did not really scrutinized its appearance but at a quick glance it did not look bloody or melenic.  Presented late this morning to the ED for evaluation. BPs 146/54.  HR 66.  Sat 97$ on RA.  Afebrile Hgb 6, 12.6 a couple of weeks ago, ~12 one year ago.  MCV 105, 97 two weeks ago.  Platelets, WBCs normal.   BUN/creatinine 48/1.2, 15/1.0 six weeks ago.   FOBT +.     Fm Hx bone cancer, skin cancer dad died age 16.  Multiple myeloma pancreatic cancer in mom who died in her early 25s. Lives with his wife Dasan Hardman Pence  Past Medical History:  Diagnosis Date   Appendicitis    Atrial fibrillation ([place]) 12/12/2013   BPH (benign prostatic hypertrophy)    CAD (coronary artery disease) 12/2015   Cath by Dr [name] reveals distal and small vessel CAD.  Medical therapy advised.   Chest pain 12/03/2015   CHF (congestive heart failure) (HCC)    Complete heart block ([place])    s/p PPM   Coronary artery disease    Coronary artery disease involving native coronary artery of native heart with unstable angina pectoris ([place]) 08/11/2017   D-dimer, elevated 04/03/2017   Drug reaction 07/11/2021   History of blood transfusion 1968   "probably; related [place]   History of kidney stones    History of SCC (squamous cell carcinoma) of skin  07/24/2020   right upper arm/excision   Hyperglycemia 11/05/2013   Hyperlipidemia 11/05/2013   Hypertension    Hypothyroidism    Hypothyroidism, unspecified 11/05/2013   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  AHI was 20/h while supine.  Oxygen saturations dropped to 87%.  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   Rheumatoid arthritis ([place])    "hands" (08/11/2017)   Type II diabetes mellitus ([place])     Past Surgical History:  Procedure Laterality Date   BACK SURGERY     CARDIAC CATHETERIZATION N/A 12/28/2015   Procedure: Left Heart Cath and Coronary Angiography;  Surgeon: Belva Crome, MD;  Location: [facility];  Service: Cardiovascular;  Laterality: N/A;   CATARACT EXTRACTION W/ INTRAOCULAR LENS  IMPLANT, BILATERAL Bilateral    CORONARY ANGIOPLASTY WITH STENT PLACEMENT  08/11/2017   "2 stents"   CORONARY STENT INTERVENTION N/A 08/11/2017   Procedure: CORONARY STENT INTERVENTION;  Surgeon: Troy Sine, MD;  Location: [facility];  Service: Cardiovascular;  Laterality: N/A;   CYSTOSCOPY W/ STONE MANIPULATION     INGUINAL HERNIA REPAIR Left    INSERT / REPLACE / REMOVE PACEMAKER  12/09/2011   SJM Accent DR RF implanted by DR Allred for complete heart block and syncope   JOINT REPLACEMENT     LAPAROSCOPIC CHOLECYSTECTOMY     LEAD REVISION/REPAIR N/A 08/09/2020   Procedure: LEAD REVISION/REPAIR;  Surgeon: [name], MD;  Location: [facility];  Service: Cardiovascular;  Laterality: N/A;   LITHOTRIPSY     LUMBAR [place]     "removed arthritis and spurs"   PERMANENT PACEMAKER INSERTION N/A 12/09/2011   Procedure: PERMANENT PACEMAKER INSERTION;  Surgeon: [name], MD;  Location: [facility];  Service: Cardiovascular;  Laterality: N/A;   PPM GENERATOR  CHANGEOUT N/A 08/09/2020   Procedure: PPM GENERATOR CHANGEOUT;  Surgeon: [name], MD;  Location: [facility];  Service: Cardiovascular;  Laterality: N/A;   REPLACEMENT TOTAL KNEE Right    RIGHT HEART CATH N/A 05/29/2021   Procedure: RIGHT HEART CATH;  Surgeon: Troy Sine, MD;  Location: [facility];  Service: Cardiovascular;  Laterality: N/A;   RIGHT/LEFT HEART CATH AND CORONARY ANGIOGRAPHY N/A 08/11/2017   Procedure: RIGHT/LEFT HEART CATH AND CORONARY ANGIOGRAPHY;  Surgeon: Larey Dresser, MD;  Location: [facility];  Service: Cardiovascular;  Laterality: N/A;   TRANSURETHRAL RESECTION OF PROSTATE  2017/2018    Prior  to Admission medications   Medication Sig Start Date End Date Taking? Authorizing Provider  acetaminophen (TYLENOL) 500 MG tablet Take 500 mg by mouth every 6 (six) hours as needed for moderate pain.    [provider]  allopurinol (ZYLOPRIM) 100 MG tablet Take 200 mg by mouth daily. 11/05/21   [provider]  aspirin EC 81 MG tablet Take 1 tablet (81 mg total) by mouth daily. 02/11/22   Duke, Tami Lin, PA  diltiazem (CARDIZEM CD) 120 MG 24 hr capsule Take 1 capsule (120 mg total) by mouth daily. Please schedule appointment for further refills 02/11/22   Duke, Tami Lin, PA  docusate sodium (COLACE) 100 MG capsule Take 100 mg by mouth 2 (two) times daily.    [provider]  gabapentin (NEURONTIN) 300 MG capsule Take 300 mg by mouth at bedtime. 07/25/19   [provider]  glimepiride (AMARYL) 2 MG tablet Take 2 mg by mouth every morning. 07/25/19   [provider]  hydrochlorothiazide (HYDRODIURIL) 25 MG tablet Take 0.5 tablets (12.5 mg total) by mouth daily as needed. 04/01/22 03/27/23  Troy Sine, MD  leflunomide (ARAVA) 10 MG tablet Take 10 mg by mouth daily. 12/26/21   [provider]  nebivolol (BYSTOLIC) 10 MG tablet Take 10 mg by mouth daily.    [provider]  nitroGLYCERIN  (NITROSTAT) 0.4 MG SL tablet Place 1 tablet (0.4 mg total) under the tongue every 5 (five) minutes as needed for chest pain. 06/03/21   Troy Sine, MD  Omega-3 Fatty Acids (OMEGA-3 FISH OIL PO) Take 1 capsule by mouth daily.    [provider]  pantoprazole (PROTONIX) 40 MG tablet Take 40 mg by mouth daily. 01/14/22   [provider]  Pirfenidone (ESBRIET) 267 MG TABS Take 2 tablets (534 mg total) by mouth 3 (three) times daily with meals. **NOTE LOW DOSE due to kidney function** 11/08/21   Knox Saliva S, RPH-CPP  pravastatin (PRAVACHOL) 20 MG tablet Take 1 tablet (20 mg total) by mouth every evening. 02/11/22   Duke, Tami Lin, PA  rivaroxaban (XARELTO) 20 MG TABS tablet TAKE 1 TABLET DAILY WITH SUPPER 02/11/22   Duke, Tami Lin, PA  telmisartan (MICARDIS) 80 MG tablet Take 1 tablet (80 mg total) by mouth daily. 02/11/22   Duke, Tami Lin, PA  terbinafine (LAMISIL) 1 % cream Apply twice daily to buttocks and left thigh rash until clear 01/02/22   Moye, [place], MD  thyroid (ARMOUR) 90 MG tablet Take 90 mg by mouth every morning.     [provider]  traZODone (DESYREL) 50 MG tablet Take 1 tablet by mouth at bedtime. 02/21/22 02/21/23  [provider]  triamcinolone ointment (KENALOG) 0.1 % Apply twice daily to arms up to 2 weeks as needed. Avoid applying to face, groin, and axilla. 02/18/22   Moye, [place], MD  Trospium Chloride 60 MG CP24 TAKE 1 CAPSULE BY MOUTH DAILY 07/04/21   Stoioff, Ronda Fairly, MD  VTAMA 1 % CREA APPLY 1 APPLICATION TOPICALLY DAILY 04/01/22   Moye, [place], MD    Scheduled Meds:  sodium chloride   Intravenous Once   sodium chloride flush  3 mL Intravenous Q12H   Infusions:  pantoprazole (PROTONIX) IV     PRN Meds:    Allergies as of 05/23/2022 - Review Complete 05/23/2022  Allergen Reaction Noted   Ace inhibitors Swelling 02/29/2020   Lisinopril  08/14/2020   Celecoxib Rash 11/29/2014    Family History  Problem  Relation Age of Onset   Pancreatic cancer Mother    Bone cancer Father    Alzheimer's disease Sister    Heart attack Paternal Uncle    Arthritis Maternal Grandfather    Alzheimer's disease Paternal Grandmother    Lung cancer Paternal Grandfather     Social History   Socioeconomic History   Marital status: Married    Spouse name: Not on file   Number of children: Not on file   Years of education: Not on file   Highest education level: Not on file  Occupational History   Not on file  Tobacco Use   Smoking status: Former    Packs/day: 1.00    Years: 5.00    Total pack years: 5.00    Types: Cigarettes    Quit date: 07/10/1972    Years since quitting: 49.9   Smokeless tobacco: Former    Types: Chew    Quit date: 1975  Vaping Use   Vaping Use: Never used  Substance and Sexual Activity   Alcohol use: Yes    Alcohol/week: 1.0 standard drink of alcohol    Types: 1 Glasses of wine per week   Drug use: No   Sexual activity: Not Currently  Other Topics Concern   Not on file  Social History Narrative   Not on file   Social Determinants of Health   Financial Resource Strain: Not on file  Food Insecurity: Not on file  Transportation Needs: Not on file  Physical Activity: Not on file  Stress: Not on file  Social Connections: Not on file  Intimate Partner Violence: Not on file    REVIEW OF SYSTEMS: Constitutional: Fatigue, weakness as per HPI. ENT:  + dry mouth.  No nose bleeds Pulm: Stable DOE CV:  No palpitations, no LE edema.  No angina GU:  No hematuria, no frequency GI: See HPI. Heme: Denies unusual or excessive bleeding or bruising. Transfusions: None. Neuro: Dizziness but no syncope.  No seizures.  No headaches, no peripheral tingling or numbness Derm:  No itching, no rash or sores.  Endocrine:  No sweats or chills.  No polyuria or dysuria Immunization: Reviewed.  Up-to-date on multiple vaccinations. Travel: Not queried.   PHYSICAL EXAM: Vital signs in  last 24 hours: Vitals:   05/23/22 1022  BP: (!) 146/54  Pulse: 66  Resp: 16  Temp: 98.2 F (36.8 C)  SpO2: 97%   Wt Readings from Last 3 Encounters:  05/23/22 78 kg  05/06/22 78.7 kg  04/01/22 82.4 kg    General: Alert, pale.  Does not appear ill.  Resting on stretcher, comfortable. Head: No facial asymmetry or swelling.  No signs of head trauma. Eyes: Conjunctiva pale.  EOMI Ears: No obvious hearing deficit Nose: No congestion or discharge Mouth: Oral mucosa moist, pink, clear.  Good dentition.  Tongue midline. Neck: No JVD, no masses, no thyromegaly Lungs: Dry crackles in the bases bilaterally.  No labored breathing.  No cough. Heart: RRR with heart rate on monitor in the 60s.  No MRG.  S1, S2 present Abdomen: Not distended.  Soft without tenderness.  Active bowel sounds.  No HSM, masses, bruits, hernias..   Rectal: Deferred, not repeated.  Per ED physician Dr. Ashok Cordia, stool very dark/almost black.  It tested FOBT positive. Musc/Skeltl: No joint redness, swelling or gross deformity. Extremities: No CCE. Neurologic: Alert.  Oriented x 3.  Good historian.  Moves all 4 limbs without weakness or tremor. Skin: Pallor.  No obvious  telangiectasia.  No suspicious lesions, rashes, sores. Nodes: No cervical adenopathy. Psych: Calm, pleasant, cooperative.  Pleasant affect.  Intake/Output from previous day: No intake/output data recorded. Intake/Output this shift: No intake/output data recorded.  LAB RESULTS: Recent Labs    05/23/22 1216  WBC 8.7  HGB 6.0*  HCT 19.9*  PLT 172   BMET Lab Results  Component Value Date   NA 139 05/23/2022   NA 139 05/21/2022   NA 141 11/27/2021   K 4.6 05/23/2022   K 4.6 05/21/2022   K 3.7 11/27/2021   CL 109 05/23/2022   CL 106 05/21/2022   CL 105 11/27/2021   CO2 20 (L) 05/23/2022   CO2 27 05/21/2022   CO2 29 11/27/2021   GLUCOSE 138 (H) 05/23/2022   GLUCOSE 121 (H) 05/21/2022   GLUCOSE 141 (H) 11/27/2021   BUN 48 (H) 05/23/2022    BUN 49 (H) 05/21/2022   BUN 19 11/27/2021   CREATININE 1.22 05/23/2022   CREATININE 1.41 05/21/2022   CREATININE 1.31 11/27/2021   CALCIUM 8.7 (L) 05/23/2022   CALCIUM 9.3 05/21/2022   CALCIUM 9.3 11/27/2021   LFT Recent Labs    05/21/22 1208  PROT 6.4  ALBUMIN 3.6  AST 14  ALT 11  ALKPHOS 59  BILITOT 0.4  BILIDIR 0.1   PT/INR Lab Results  Component Value Date   INR 1.0 11/10/2018   INR 1.07 08/11/2017   INR 1.1 12/27/2015   Hepatitis Panel No results for input(s): "HEPBSAG", "HCVAB", "HEPAIGM", "HEPBIGM" in the last 72 hours. C-Diff No components found for: "CDIFF" Lipase  No results found for: "LIPASE"  Drugs of Abuse     Component Value Date/Time   LABOPIA NEGATIVE 12/09/2011 1046   COCAINSCRNUR NEGATIVE 12/09/2011 1046   LABBENZ NEGATIVE 12/09/2011 1046   AMPHETMU NEGATIVE 12/09/2011 [place] 12/09/2011 1046   LABBARB NEGATIVE 12/09/2011 1046     RADIOLOGY STUDIES: No results found.    IMPRESSION:     Macrocytic anemia.  Hgb down from 12.6 to 6 over 2 weeks    FOBT +.  Melenic stools.  On Protonix 40 mg po daily.      Chronic xarelto, low dose ASA for A fib.  Last dose was 12/28 (yesterday morning).    Azotemia, normal creatinine.  Suspect this is due to upper GI bleed associated protein load.    Adenomatous colon polyp(s) 2005.  No polyps 2013.  Cologuard negative 2021.  Set for colonoscopy 06/10/21    [place] (diminished taste sensation) for 2 months.      Dysphagia hx.  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         CAD.  Stented 07/2017.  No longer on Plavix.    DM 2 not on insulin.  A1c 8.1 in August 2023  PLAN:     EGD, tmrw.  Okay for clear liquids.  May benefit from dilation of esophagus at that time.      Agree w transfusing 2 PRBCs.    Patient received loading dose of 80 mg IV Protonix and is scheduled for Protonix 40 mg iv bid.       Ordered clear liquid diet.   NPO after midnightOrdered anemia panel.     Cameron [name]  05/23/2022, 1:02 PM Phone [phone]

## 2022-05-23 NOTE — Telephone Encounter (Signed)
Spoke with patient who is in the ED at [facility]. He stated that for the past 1.5 weeks, he has felt woozy, weak, and with chest tightness. He never took NTG. Reviewed how to use NTG. He wanted to exercise this AM, but decided to go to the ED.

## 2022-05-23 NOTE — H&P (Signed)
History and Physical   Cameron [name] HDQ:[id number] DOB: 1940-06-03 DOA: 05/23/2022  PCP: [name], MD   Patient coming from: Home  Chief Complaint: Weakness, lightheaded  HPI: Cameron [name] is a 81 y.o. male with medical history significant of psoriatic arthritis, rheumatoid arthritis, OSA, hypothyroidism, hyperlipidemia, CAD status post DES, atrial fibrillation, CKD 3B, complete heart block status post pacemaker, ILD, hypertension, BPH status post TURP, diabetes presenting with weakness and lightheadedness.  Patient reports 2 weeks of generalized weakness.  Has also been experiencing lightheadedness on standing for the past week or so.  He reports dark/black stools for the past 5 to 7 days.  He is also been experiencing some intermittent chest tightness and shortness of breath.  He has not noticed any significant increase in chest pain or shortness of breath with exertion.  He denies fevers, chills, shortness of breath, abdominal pain, constipation, diarrhea, nausea, vomiting.  ED Course: Signs in the ED for blood pressure in the 119 systolic.  Lab workup included BMP with bicarb 20, BUN 40, creatinine stable at 1.22, glucose 121.  CBC with hemoglobin of 6.0 down from 11 at baseline.  FOBT positive.  Troponin pending.  Respiratory panel flu COVID RSV pending.  Patient received IV PPI and 2 units of PRBCs have been ordered in the ED.  GI consulted and will see the patient.  Review of Systems: As per HPI otherwise all other systems reviewed and are negative.  Past Medical History:  Diagnosis Date   Appendicitis    Atrial fibrillation ([place]) 12/12/2013   BPH (benign prostatic hypertrophy)    CAD (coronary artery disease) 12/2015   Cath by Dr [name] reveals distal and small vessel CAD.  Medical therapy advised.   Chest pain 12/03/2015   CHF (congestive heart failure) (HCC)    Complete heart block ([place])    s/p PPM   Coronary artery disease    Coronary artery disease involving  native coronary artery of native heart with unstable angina pectoris ([place]) 08/11/2017   D-dimer, elevated 04/03/2017   Drug reaction 07/11/2021   History of blood transfusion 1968   "probably; related [place]   History of kidney stones    History of SCC (squamous cell carcinoma) of skin 07/24/2020   right upper arm/excision   Hyperglycemia 11/05/2013   Hyperlipidemia 11/05/2013   Hypertension    Hypothyroidism    Hypothyroidism, unspecified 11/05/2013   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  AHI was 20/h while supine.  Oxygen saturations dropped to 87%.  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   Rheumatoid arthritis ([place])    "hands" (08/11/2017)   Type II diabetes mellitus ([place])     Past Surgical History:  Procedure Laterality Date   BACK SURGERY     CARDIAC CATHETERIZATION N/A 12/28/2015   Procedure: Left Heart Cath and Coronary Angiography;  Surgeon: Belva Crome, MD;  Location: [place] [facility];  Service: Cardiovascular;  Laterality: N/A;   CATARACT EXTRACTION W/ INTRAOCULAR LENS  IMPLANT, BILATERAL Bilateral    CORONARY ANGIOPLASTY WITH STENT PLACEMENT  08/11/2017   "2 stents"   CORONARY STENT INTERVENTION N/A 08/11/2017   Procedure: CORONARY STENT INTERVENTION;  Surgeon: Troy Sine, MD;  Location: [place] [facility];  Service: Cardiovascular;  Laterality: N/A;   CYSTOSCOPY W/ STONE MANIPULATION     INGUINAL HERNIA REPAIR Left    INSERT / REPLACE / REMOVE PACEMAKER  12/09/2011   SJM Accent DR RF implanted by DR Allred for complete heart block and syncope   JOINT REPLACEMENT     LAPAROSCOPIC CHOLECYSTECTOMY     LEAD REVISION/REPAIR N/A 08/09/2020   Procedure: LEAD REVISION/REPAIR;  Surgeon: [name], MD;  Location: [place] [place] [facility];  Service:  Cardiovascular;  Laterality: N/A;   LITHOTRIPSY     LUMBAR [place]     "removed arthritis and spurs"   PERMANENT PACEMAKER INSERTION N/A 12/09/2011   Procedure: PERMANENT PACEMAKER INSERTION;  Surgeon: [name], MD;  Location[facility];  Service: Cardiovascular;  Laterality: N/A;   PPM GENERATOR CHANGEOUT N/A 08/09/2020   Procedure: PPM GENERATOR CHANGEOUT;  Surgeon: [name], MD;  Location: [place] [facility];  Service: Cardiovascular;  Laterality: N/A;   REPLACEMENT TOTAL KNEE Right    RIGHT HEART CATH N/A 05/29/2021   Procedure: RIGHT HEART CATH;  Surgeon: Troy Sine, MD;  Location: [place] [facility];  Service: Cardiovascular;  Laterality: N/A;   RIGHT/LEFT HEART CATH AND CORONARY ANGIOGRAPHY N/A 08/11/2017   Procedure: RIGHT/LEFT HEART CATH AND CORONARY ANGIOGRAPHY;  Surgeon: Larey Dresser, MD;  Location: [place];  Service: Cardiovascular;  Laterality: N/A;   TRANSURETHRAL RESECTION OF PROSTATE  2017/2018    Social History  reports that he quit smoking about 49 years ago. His smoking use included cigarettes. He has a 5.00 pack-year smoking history. He quit smokeless tobacco use about 49 years ago.  His smokeless tobacco use included chew. He reports current alcohol use of about 1.0 standard drink of alcohol per week. He reports that he does not use drugs.  Allergies  Allergen Reactions   Ace Inhibitors Swelling    Tongue swelling, angioedema   Lisinopril     Other reaction(s): Lip swelling, O/E - lip swelling   Celecoxib Rash    Skin rash     Family History  Problem Relation Age of Onset   Pancreatic cancer Mother    Bone cancer Father    Alzheimer's disease Sister    Heart attack Paternal Uncle    Arthritis Maternal Grandfather    Alzheimer's disease Paternal Grandmother    Lung cancer Paternal Grandfather   Reviewed on admission  Prior to Admission medications   Medication Sig Start Date End Date Taking? Authorizing Provider   acetaminophen (TYLENOL) 500 MG tablet Take 500 mg by mouth every 6 (six) hours as needed for moderate pain.    [provider]  allopurinol (ZYLOPRIM) 100 MG tablet Take 200 mg by mouth daily. 11/05/21   [provider]  aspirin EC 81 MG tablet Take 1 tablet (81 mg total) by mouth daily. 02/11/22   Duke, Tami Lin, PA  diltiazem (CARDIZEM CD) 120 MG 24 hr capsule Take 1 capsule (120 mg total) by mouth daily. Please schedule appointment for further refills 02/11/22   Duke, Tami Lin, PA  docusate sodium (COLACE) 100 MG capsule Take 100 mg by mouth 2 (two) times daily.    [provider]  gabapentin (NEURONTIN) 300 MG capsule Take 300 mg by mouth at bedtime. 07/25/19   [provider]  glimepiride (AMARYL) 2 MG tablet Take 2 mg by mouth every morning. 07/25/19   [provider]  hydrochlorothiazide (HYDRODIURIL) 25 MG tablet Take 0.5 tablets (12.5 mg total) by  mouth daily as needed. 04/01/22 03/27/23  Troy Sine, MD  leflunomide (ARAVA) 10 MG tablet Take 10 mg by mouth daily. 12/26/21   [provider]  nebivolol (BYSTOLIC) 10 MG tablet Take 10 mg by mouth daily.    [provider]  nitroGLYCERIN (NITROSTAT) 0.4 MG SL tablet Place 1 tablet (0.4 mg total) under the tongue every 5 (five) minutes as needed for chest pain. 06/03/21   Troy Sine, MD  Omega-3 Fatty Acids (OMEGA-3 FISH OIL PO) Take 1 capsule by mouth daily.    [provider]  pantoprazole (PROTONIX) 40 MG tablet Take 40 mg by mouth daily. 01/14/22   [provider]  Pirfenidone (ESBRIET) 267 MG TABS Take 2 tablets (534 mg total) by mouth 3 (three) times daily with meals. **NOTE LOW DOSE due to kidney function** 11/08/21   Knox Saliva S, RPH-CPP  pravastatin (PRAVACHOL) 20 MG tablet Take 1 tablet (20 mg total) by mouth every evening. 02/11/22   Duke, Tami Lin, PA  rivaroxaban (XARELTO) 20 MG TABS tablet TAKE 1 TABLET DAILY WITH SUPPER 02/11/22   Duke,  Tami Lin, PA  telmisartan (MICARDIS) 80 MG tablet Take 1 tablet (80 mg total) by mouth daily. 02/11/22   Duke, Tami Lin, PA  terbinafine (LAMISIL) 1 % cream Apply twice daily to buttocks and left thigh rash until clear 01/02/22   Moye, [place], MD  thyroid (ARMOUR) 90 MG tablet Take 90 mg by mouth every morning.     [provider]  traZODone (DESYREL) 50 MG tablet Take 1 tablet by mouth at bedtime. 02/21/22 02/21/23  [provider]  triamcinolone ointment (KENALOG) 0.1 % Apply twice daily to arms up to 2 weeks as needed. Avoid applying to face, groin, and axilla. 02/18/22   Moye, [place], MD  Trospium Chloride 60 MG CP24 TAKE 1 CAPSULE BY MOUTH DAILY 07/04/21   Stoioff, Ronda Fairly, MD  VTAMA 1 % CREA APPLY 1 APPLICATION TOPICALLY DAILY 04/01/22   Moye, [place], MD    Physical Exam: Vitals:   05/23/22 1022 05/23/22 1026  BP: (!) 146/54   Pulse: 66   Resp: 16   Temp: 98.2 F (36.8 C)   TempSrc: Oral   SpO2: 97%   Weight:  78 kg  Height:  _0  (1.702 m)    Physical Exam Constitutional:      General: He is not in acute distress.    Appearance: Normal appearance.  HENT:     Head: Normocephalic and atraumatic.     Mouth/Throat:     Mouth: Mucous membranes are moist.     Pharynx: Oropharynx is clear.  Eyes:     Extraocular Movements: Extraocular movements intact.     Pupils: Pupils are equal, round, and reactive to light.     Comments: Pale conjunctiva  Cardiovascular:     Rate and Rhythm: Normal rate and regular rhythm.     Pulses: Normal pulses.     Heart sounds: Normal heart sounds.  Pulmonary:     Effort: Pulmonary effort is normal. No respiratory distress.     Breath sounds: Normal breath sounds.  Abdominal:     General: Bowel sounds are normal. There is no distension.     Palpations: Abdomen is soft.     Tenderness: There is no abdominal tenderness.  Musculoskeletal:        General: No swelling or deformity.  Skin:    General: Skin is warm and  dry.     Coloration:  Skin is pale.  Neurological:     General: No focal deficit present.     Mental Status: Mental status is at baseline.    Labs on Admission: I have personally reviewed following labs and imaging studies  CBC: Recent Labs  Lab 05/23/22 1216  WBC 8.7  HGB 6.0*  HCT 19.9*  MCV 105.3*  PLT 774    Basic Metabolic Panel: Recent Labs  Lab 05/21/22 1208 05/23/22 1216  NA 139 139  K 4.6 4.6  CL 106 109  CO2 27 20*  GLUCOSE 121* 138*  BUN 49* 48*  CREATININE 1.41 1.22  CALCIUM 9.3 8.7*    GFR: Estimated Creatinine Clearance: 43.6 mL/min (by C-G formula based on SCr of 1.22 mg/dL).  Liver Function Tests: Recent Labs  Lab 05/21/22 1208  AST 14  ALT 11  ALKPHOS 59  BILITOT 0.4  PROT 6.4  ALBUMIN 3.6    Urine analysis:    Component Value Date/Time   COLORURINE YELLOW (A) 09/14/2016 0128   APPEARANCEUR Clear 02/10/2022 1459   LABSPEC 1.014 09/14/2016 0128   LABSPEC 1.019 12/09/2011 1046   PHURINE 5.0 09/14/2016 0128   GLUCOSEU 3+ (A) 02/10/2022 1459   GLUCOSEU Negative 12/09/2011 1046   HGBUR MODERATE (A) 09/14/2016 0128   BILIRUBINUR Negative 02/10/2022 1459   BILIRUBINUR Negative 12/09/2011 1046   KETONESUR 5 (A) 09/14/2016 0128   PROTEINUR Negative 02/10/2022 1459   PROTEINUR NEGATIVE 09/14/2016 0128   NITRITE Negative 02/10/2022 1459   NITRITE NEGATIVE 09/14/2016 0128   LEUKOCYTESUR Negative 02/10/2022 1459   LEUKOCYTESUR Negative 12/09/2011 1046    Radiological Exams on Admission: No results found.  EKG: Independently reviewed.  Paced rhythm at 64 bpm. ?PAC.  Assessment/Plan Principal Problem:   GI bleed Active Problems:   Complete heart block (HCC)   Hypertension   Atrial fibrillation (HCC)   Hyperlipidemia, unspecified   Hypothyroidism   Hypothyroidism, unspecified   Rheumatoid arthritis ([place])   Coronary artery disease   OSA (obstructive sleep apnea)   ILD (interstitial lung disease) ([place])   Benign prostatic  hyperplasia with urinary frequency   GI bleed > Patient presenting with weakness and lightheadedness.  Also noting recent black/dark stools.  Has had some intermittent chest tightness as well. > Hemoglobin noted to be 6.0 and ED BUN elevated 48.  General presentation suspicious for upper GI bleed. > GI consulted and will see the patient. - Monitor on telemetry - Continue with 2 units PRBCs as ordered in the ED - Appreciate GI recommendations - N.p.o. for now-continue with IV PPI - Trend hemoglobin - Hold Xarelto and aspirin  Psoriatic arthritis Rheumatoid arthritis - Continue home leflunomide  OSA - Continue home CPAP  Hypothyroidism - Continue home Armour Thyroid  Hyperlipidemia CAD > Status post DES - Continue home nebivolol, telmisartan, pravastatin - Holding home aspirin and Xarelto as above  Atrial fibrillation - Holding home Xarelto as above - Continue home nebivolol and diltiazem   Hypertension - Continue home diltiazem, nebivolol, telmisartan  CKD 3B > Creatinine near baseline at 1.22 in the ED. - Continue to trend renal function and electrolytes  Complete heart block - Status post pacemaker  ILD > Follows with pulmonology.  On [place], recent holiday for 2 weeks but restarted yesterday -Continue home meds.  BPH > History of TURP - Continue home trospium  Diabetes - SSI  DVT prophylaxis: SCDs Code Status:   Full, confirmed with patient Family Communication:  Updated at bedside Disposition Plan:   Patient is  from:  Home  Anticipated DC to:  Home  Anticipated DC date:  1 to 4 days  Anticipated DC barriers: None  Consults called:  Gastroenterology Admission status:  Observation, telemetry  Severity of Illness: The appropriate patient status for this patient is OBSERVATION. Observation status is judged to be reasonable and necessary in order to provide the required intensity of service to ensure the patient's safety. The patient's presenting  symptoms, physical exam findings, and initial radiographic and laboratory data in the context of their medical condition is felt to place them at decreased risk for further clinical deterioration. Furthermore, it is anticipated that the patient will be medically stable for discharge from the hospital within 2 midnights of admission.    Marcelyn Bruins MD [facility]  How to contact the [facility] Attending or Consulting provider [place] or covering provider during after hours [place], for this patient?   Check the care team in [facility] and look for a) attending/consulting [facility] provider listed and b) the [facility] team listed Log into [email or web address] and use [place]'s universal password to access. If you do not have the password, please contact the hospital operator. Locate the [facility] provider you are looking for under [facility] and page to a number that you can be directly reached. If you still have difficulty reaching the provider, please page the [facility] (Director on Call) for the Hospitalists listed on amion for assistance.  05/23/2022, 1:04 PM

## 2022-05-23 NOTE — ED Provider Notes (Signed)
[facility] EMERGENCY DEPARTMENT Provider Note   CSN: [id number] Arrival date & time: 05/23/22  1016     History  Chief Complaint  Patient presents with   Dizziness   Weakness   Chest Pain    Cameron [name] is a 81 y.o. male.  Pt with c/o generally feeling weak in the past couple weeks. Symptoms acute onset, moderate, persistent. Feels faint/lightheaded when stands. Recent dark/black stools. Pt notes some chest tightness in past day, but otherwise no recent exertional chest pain or discomfort. Denies cough or uri symptoms. No fever or chills. No gu c/o. No recent change in meds. No room spinning, imbalance or vertigo. No problems w balance or gait. No change in speech or vision. No focal or unilateral numbness or weakness.   The history is provided by the patient, medical records, a significant other and the EMS personnel.  Dizziness Associated symptoms: chest pain and weakness   Associated symptoms: no headaches, no palpitations, no shortness of breath and no vomiting   Weakness Associated symptoms: chest pain and dizziness   Associated symptoms: no abdominal pain, no dysuria, no fever, no headaches, no shortness of breath and no vomiting   Chest Pain Associated symptoms: dizziness and weakness   Associated symptoms: no abdominal pain, no back pain, no fever, no headache, no numbness, no palpitations, no shortness of breath and no vomiting        Home Medications Prior to Admission medications   Medication Sig Start Date End Date Taking? Authorizing Provider  acetaminophen (TYLENOL) 500 MG tablet Take 500 mg by mouth every 6 (six) hours as needed for moderate pain.    [provider]  allopurinol (ZYLOPRIM) 100 MG tablet Take 200 mg by mouth daily. 11/05/21   [provider]  aspirin EC 81 MG tablet Take 1 tablet (81 mg total) by mouth daily. 02/11/22   Duke, Tami Lin, PA  diltiazem (CARDIZEM CD) 120 MG 24 hr capsule Take 1 capsule  (120 mg total) by mouth daily. Please schedule appointment for further refills 02/11/22   Duke, Tami Lin, PA  docusate sodium (COLACE) 100 MG capsule Take 100 mg by mouth 2 (two) times daily.    [provider]  gabapentin (NEURONTIN) 300 MG capsule Take 300 mg by mouth at bedtime. 07/25/19   [provider]  glimepiride (AMARYL) 2 MG tablet Take 2 mg by mouth every morning. 07/25/19   [provider]  hydrochlorothiazide (HYDRODIURIL) 25 MG tablet Take 0.5 tablets (12.5 mg total) by mouth daily as needed. 04/01/22 03/27/23  Troy Sine, MD  leflunomide (ARAVA) 10 MG tablet Take 10 mg by mouth daily. 12/26/21   [provider]  nebivolol (BYSTOLIC) 10 MG tablet Take 10 mg by mouth daily.    [provider]  nitroGLYCERIN (NITROSTAT) 0.4 MG SL tablet Place 1 tablet (0.4 mg total) under the tongue every 5 (five) minutes as needed for chest pain. 06/03/21   Troy Sine, MD  Omega-3 Fatty Acids (OMEGA-3 FISH OIL PO) Take 1 capsule by mouth daily.    [provider]  pantoprazole (PROTONIX) 40 MG tablet Take 40 mg by mouth daily. 01/14/22   [provider]  Pirfenidone (ESBRIET) 267 MG TABS Take 2 tablets (534 mg total) by mouth 3 (three) times daily with meals. **NOTE LOW DOSE due to kidney function** 11/08/21   Knox Saliva S, RPH-CPP  pravastatin (PRAVACHOL) 20 MG tablet Take 1 tablet (20 mg total) by mouth every  evening. 02/11/22   Duke, Tami Lin, PA  rivaroxaban (XARELTO) 20 MG TABS tablet TAKE 1 TABLET DAILY WITH SUPPER 02/11/22   Duke, Tami Lin, PA  telmisartan (MICARDIS) 80 MG tablet Take 1 tablet (80 mg total) by mouth daily. 02/11/22   Duke, Tami Lin, PA  terbinafine (LAMISIL) 1 % cream Apply twice daily to buttocks and left thigh rash until clear 01/02/22   Moye, [place], MD  thyroid (ARMOUR) 90 MG tablet Take 90 mg by mouth every morning.     [provider]  traZODone (DESYREL) 50 MG tablet Take 1 tablet by  mouth at bedtime. 02/21/22 02/21/23  [provider]  triamcinolone ointment (KENALOG) 0.1 % Apply twice daily to arms up to 2 weeks as needed. Avoid applying to face, groin, and axilla. 02/18/22   Moye, [place], MD  Trospium Chloride 60 MG CP24 TAKE 1 CAPSULE BY MOUTH DAILY 07/04/21   Stoioff, Ronda Fairly, MD  VTAMA 1 % CREA APPLY 1 APPLICATION TOPICALLY DAILY 04/01/22   Moye, [place], MD      Allergies    Ace inhibitors, Lisinopril, and Celecoxib    Review of Systems   Review of Systems  Constitutional:  Positive for appetite change. Negative for chills and fever.  HENT:  Negative for sore throat.   Eyes:  Negative for redness.  Respiratory:  Negative for shortness of breath.   Cardiovascular:  Positive for chest pain. Negative for palpitations and leg swelling.  Gastrointestinal:  Negative for abdominal pain and vomiting.  Genitourinary:  Negative for dysuria and flank pain.  Musculoskeletal:  Negative for back pain and neck pain.  Skin:  Negative for rash.  Neurological:  Positive for dizziness, weakness and light-headedness. Negative for speech difficulty, numbness and headaches.  Hematological:  Does not bruise/bleed easily.  Psychiatric/Behavioral:  Negative for confusion.     Physical Exam Updated Vital Signs BP (!) 146/54 (BP Location: Right Arm)   Pulse 66   Temp 98.2 F (36.8 C) (Oral)   Resp 16   Ht 1.702 m (_0 )   Wt 78 kg   SpO2 97%   BMI 26.94 kg/m  Physical Exam Vitals and nursing note reviewed.  Constitutional:      Appearance: Normal appearance. He is well-developed.  HENT:     Head: Atraumatic.     Nose: Nose normal.     Mouth/Throat:     Mouth: Mucous membranes are moist.     Pharynx: Oropharynx is clear.  Eyes:     General: No scleral icterus.    Pupils: Pupils are equal, round, and reactive to light.  Neck:     Trachea: No tracheal deviation.  Cardiovascular:     Rate and Rhythm: Normal rate and regular rhythm.     Pulses: Normal pulses.      Heart sounds: Normal heart sounds. No murmur heard.    No friction rub. No gallop.  Pulmonary:     Effort: Pulmonary effort is normal. No accessory muscle usage or respiratory distress.     Breath sounds: Normal breath sounds.  Abdominal:     General: Bowel sounds are normal. There is no distension.     Palpations: Abdomen is soft.     Tenderness: There is no abdominal tenderness. There is no guarding.  Genitourinary:    Comments: No cva tenderness. Very dark stool, almost black - sent for hemoccult. Musculoskeletal:        General: No swelling.     Cervical back: Normal range  of motion and neck supple. No rigidity.  Skin:    General: Skin is warm and dry.     Findings: No rash.  Neurological:     Mental Status: He is alert.     Comments: Alert, speech clear. Motor/sens grossly intact bil.   Psychiatric:        Mood and Affect: Mood normal.     ED Results / Procedures / Treatments   Labs (all labs ordered are listed, but only abnormal results are displayed) Results for orders placed or performed during the hospital encounter of 05/23/22  CBC  Result Value Ref Range   WBC 8.7 4.0 - 10.5 K/uL   RBC 1.89 (L) 4.22 - 5.81 MIL/uL   Hemoglobin 6.0 (LL) 13.0 - 17.0 g/dL   HCT 19.9 (L) 39.0 - 52.0 %   MCV 105.3 (H) 80.0 - 100.0 fL   MCH 31.7 26.0 - 34.0 pg   MCHC 30.2 30.0 - 36.0 g/dL   RDW 16.4 (H) 11.5 - 15.5 %   Platelets 172 150 - 400 K/uL   nRBC 0.0 0.0 - 0.2 %  CBG monitoring, ED  Result Value Ref Range   Glucose-Capillary 142 (H) 70 - 99 mg/dL  POC occult blood, ED  Result Value Ref Range   Fecal Occult Bld POSITIVE (A) NEGATIVE  Type and screen  Result Value Ref Range   ABO/RH(D) PENDING    Antibody Screen PENDING    Sample Expiration      05/26/2022,2359 Performed at [facility], [address] [place], [place] [address]    CUP PACEART REMOTE DEVICE CHECK  Result Date: 05/08/2022 Scheduled remote reviewed. Normal device function.  Next remote 91  days. 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  Procedures Procedures    Medications Ordered in ED Medications  pantoprazole (PROTONIX) 80 mg /NS 100 mL IVPB (has no administration in time range)  0.9 %  sodium chloride infusion (Manually program via Guardrails IV Fluids) (has no administration in time range)    ED Course/ Medical Decision Making/ A&P                           Medical Decision Making Problems Addressed: Acute GI bleeding: acute illness or injury with systemic symptoms that poses a threat to life or bodily functions Anticoagulant long-term use: chronic illness or injury that poses a threat to life or bodily functions Atrial fibrillation, unspecified type ([place]): chronic illness or injury that poses a threat to life or bodily functions Generalized weakness: acute illness or injury with systemic symptoms that poses a threat to life or bodily functions Symptomatic anemia: acute illness or injury with systemic symptoms that poses a threat to life or bodily functions  Amount and/or Complexity of Data Reviewed Independent Historian: EMS    Details: Ems/fam, hx External Data Reviewed: notes. Labs: ordered. Decision-making details documented in ED Course. Discussion of management or test interpretation with external provider(s): Gi/medicine  Risk Prescription drug management. Decision regarding hospitalization.   Iv ns. Continuous pulse ox and cardiac monitoring. Labs ordered/sent.   Reviewed nursing notes  and prior charts for additional history. External reports reviewed. Additional history from:  Cardiac  monitor: sinus rhythm, rate 68.  Labs reviewed/interpreted by me - stool heme pos.   Additional labs reviewed/interpreted by me - hgb 6, very low.    Given anticoag therapy, black stools, hgb 6, symptomatic, will admit, consult gi and consult medicine.   Emergent prbc transfusion, 2 units. Protonix iv.   CRITICAL CARE RE: acute gi bleeding w symptomatic/severe anemia requiring emergent prbc transfusion Performed by: Mirna Mires Total critical care time: 45 minutes Critical care time was exclusive of separately billable procedures and treating other patients. Critical care was necessary to treat or prevent imminent or life-threatening deterioration. Critical care was time spent personally by me on the following activities: development of treatment plan with patient and/or surrogate as well as nursing, discussions with consultants, evaluation of patient's response to treatment, examination of patient, obtaining history from patient or surrogate, ordering and performing treatments and interventions, ordering and review of laboratory studies, ordering and review of radiographic studies, pulse oximetry and re-evaluation of patient's condition.          Final Clinical Impression(s) / ED Diagnoses Final diagnoses:  Acute GI bleeding  Symptomatic anemia  Generalized weakness  Atrial fibrillation, unspecified type ([place])  Anticoagulant long-term use    Rx / DC Orders ED Discharge Orders     None         Lajean Saver, MD 05/23/22 1243

## 2022-05-23 NOTE — ED Triage Notes (Addendum)
Pt. Stated, Cameron [name] had some whoozingness and weakness during the past week. I've also had some chest tightness. EMS stated, he has had some tarry stool for the last couple of days. IV left posterior hand.

## 2022-05-23 NOTE — Consult Note (Addendum)
[facility] Consult: 1:02 PM 05/23/2022  LOS: 0 days    Referring Provider: Dr Trilby Drummer  Primary Care Physician:  [name], MD Primary Gastroenterologist:  Dr. Loistine Simas at [facility] in [place].   Scheduled for upcoming colonoscopy 06/10/22 w Dr Andrey Farmer    Reason for Consultation:  Hgb 6, melenic stools.    HPI: Cameron [name] is a 81 y.o. male.  PMH a fib.  Chronic xarelto.  Heart block, sp pacemaker.  CAD, sp stenting 2019.  OSA, on CPAP.  COPD.  Hypothyroidism.  Psoriatic arthritis.  NIDDM. Sp cholecystectomy.  Elevated LFTs August 2023 with normal T. bili, alk phos 118, AST/ALT 73/94.  On recheck in September, October and mid December these had normalized.  Abdominal ultrasound 12/2021: Diminished echo in upper right kidney, possibly complex renal cyst.  Renal protocol CT recommended.  Gallbladder surgically absent.  CBD 4.2 mm.  Liver unremarkable.  PV Dopplers normal.  01/2022 renal protocol CT showed bilateral renal stones likely accounting for microhematuria.  No obstructing stones in the ureters or bladder.  No worrisome renal or bladder lesions.  Advanced atherosclerotic calcification involving aorta and branch vessels  02/2019 barium esophagram.  For dysphagia to liquids, solids.  Demonstrated mild, smooth distal esophageal stricture proximal to the EG junction which restricted passage of barium tablet. 09/2021 Barium esophagram dysphagia: Moderate dysmotility.  No stricture, reflux, HH.  13 mm tablet passed wo incidence.    NO PRIOR EGDS Adenomatous polyps at 2005 colonoscopy.   2013 colonoscopy: diverticulosis Surveilance colonoscopy placed on hold 2019 due to need for post coronary stent Plavix.   05/2019 Cologuard: negative.   Has colonoscopy scheduled for 06/10/22.  > 2 months  of diminished taste sensation and diminished po due to this.  6 to 8 # wt loss.  >/= 2 weeks fatigue.  Then developed dizziness, weakness but no change in stable dyspnea on exertion.  No palpitations, irregular heart rate, angina.  No nausea, vomiting, abdominal/epigastric pain.  Compliant with Protonix 40 mg po daily.  Last dose Xarelto was yesterday morning 12/28.  The weakness, dizziness became profound the last couple of days.  2 days ago after a bowel movement, he saw some very dark but not bloody remnant of stool.  The stool in the commode did not look abnormal to him.  Last night he had a large bowel movement but did not really scrutinized its appearance but at a quick glance it did not look bloody or melenic.  Presented late this morning to the ED for evaluation. BPs 146/54.  HR 66.  Sat 97$ on RA.  Afebrile Hgb 6, 12.6 a couple of weeks ago, ~12 one year ago.  MCV 105, 97 two weeks ago.  Platelets, WBCs normal.   BUN/creatinine 48/1.2, 15/1.0 six weeks ago.   FOBT +.     Fm Hx bone cancer, skin cancer dad died age 16.  Multiple myeloma pancreatic cancer in mom who died in her early 25s. Lives with his wife Cameron [name]  Past Medical History:  Diagnosis Date   Appendicitis    Atrial fibrillation ([place]) 12/12/2013   BPH (benign prostatic hypertrophy)    CAD (coronary artery disease) 12/2015   Cath by Dr [name] reveals distal and small vessel CAD.  Medical therapy advised.   Chest pain 12/03/2015   CHF (congestive heart failure) (HCC)    Complete heart block ([place])    s/p PPM   Coronary artery disease    Coronary artery disease involving native coronary artery of native heart with unstable angina pectoris ([place]) 08/11/2017   D-dimer, elevated 04/03/2017   Drug reaction 07/11/2021   History of blood transfusion 1968   "probably; related [place]   History of kidney stones    History of SCC (squamous cell carcinoma) of skin  07/24/2020   right upper arm/excision   Hyperglycemia 11/05/2013   Hyperlipidemia 11/05/2013   Hypertension    Hypothyroidism    Hypothyroidism, unspecified 11/05/2013   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  AHI was 20/h while supine.  Oxygen saturations dropped to 87%.  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   Rheumatoid arthritis ([place])    "hands" (08/11/2017)   Type II diabetes mellitus ([place])     Past Surgical History:  Procedure Laterality Date   BACK SURGERY     CARDIAC CATHETERIZATION N/A 12/28/2015   Procedure: Left Heart Cath and Coronary Angiography;  Surgeon: Belva Crome, MD;  Location: [facility];  Service: Cardiovascular;  Laterality: N/A;   CATARACT EXTRACTION W/ INTRAOCULAR LENS  IMPLANT, BILATERAL Bilateral    CORONARY ANGIOPLASTY WITH STENT PLACEMENT  08/11/2017   "2 stents"   CORONARY STENT INTERVENTION N/A 08/11/2017   Procedure: CORONARY STENT INTERVENTION;  Surgeon: Troy Sine, MD;  Location: [facility];  Service: Cardiovascular;  Laterality: N/A;   CYSTOSCOPY W/ STONE MANIPULATION     INGUINAL HERNIA REPAIR Left    INSERT / REPLACE / REMOVE PACEMAKER  12/09/2011   SJM Accent DR RF implanted by DR Allred for complete heart block and syncope   JOINT REPLACEMENT     LAPAROSCOPIC CHOLECYSTECTOMY     LEAD REVISION/REPAIR N/A 08/09/2020   Procedure: LEAD REVISION/REPAIR;  Surgeon: [name], MD;  Location: [facility];  Service: Cardiovascular;  Laterality: N/A;   LITHOTRIPSY     LUMBAR [place]     "removed arthritis and spurs"   PERMANENT PACEMAKER INSERTION N/A 12/09/2011   Procedure: PERMANENT PACEMAKER INSERTION;  Surgeon: [name], MD;  Location: [facility];  Service: Cardiovascular;  Laterality: N/A;   PPM GENERATOR  CHANGEOUT N/A 08/09/2020   Procedure: PPM GENERATOR CHANGEOUT;  Surgeon: [name], MD;  Location: [facility];  Service: Cardiovascular;  Laterality: N/A;   REPLACEMENT TOTAL KNEE Right    RIGHT HEART CATH N/A 05/29/2021   Procedure: RIGHT HEART CATH;  Surgeon: Troy Sine, MD;  Location: [facility];  Service: Cardiovascular;  Laterality: N/A;   RIGHT/LEFT HEART CATH AND CORONARY ANGIOGRAPHY N/A 08/11/2017   Procedure: RIGHT/LEFT HEART CATH AND CORONARY ANGIOGRAPHY;  Surgeon: Larey Dresser, MD;  Location: [facility];  Service: Cardiovascular;  Laterality: N/A;   TRANSURETHRAL RESECTION OF PROSTATE  2017/2018    Prior  to Admission medications   Medication Sig Start Date End Date Taking? Authorizing Provider  acetaminophen (TYLENOL) 500 MG tablet Take 500 mg by mouth every 6 (six) hours as needed for moderate pain.    [provider]  allopurinol (ZYLOPRIM) 100 MG tablet Take 200 mg by mouth daily. 11/05/21   [provider]  aspirin EC 81 MG tablet Take 1 tablet (81 mg total) by mouth daily. 02/11/22   [facility], Tami Lin, PA  diltiazem (CARDIZEM CD) 120 MG 24 hr capsule Take 1 capsule (120 mg total) by mouth daily. Please schedule appointment for further refills 02/11/22   [facility], Tami Lin, PA  docusate sodium (COLACE) 100 MG capsule Take 100 mg by mouth 2 (two) times daily.    [provider]  gabapentin (NEURONTIN) 300 MG capsule Take 300 mg by mouth at bedtime. 07/25/19   [provider]  glimepiride (AMARYL) 2 MG tablet Take 2 mg by mouth every morning. 07/25/19   [provider]  hydrochlorothiazide (HYDRODIURIL) 25 MG tablet Take 0.5 tablets (12.5 mg total) by mouth daily as needed. 04/01/22 03/27/23  Troy Sine, MD  leflunomide (ARAVA) 10 MG tablet Take 10 mg by mouth daily. 12/26/21   [provider]  nebivolol (BYSTOLIC) 10 MG tablet Take 10 mg by mouth daily.    [provider]  nitroGLYCERIN  (NITROSTAT) 0.4 MG SL tablet Place 1 tablet (0.4 mg total) under the tongue every 5 (five) minutes as needed for chest pain. 06/03/21   Troy Sine, MD  Omega-3 Fatty Acids (OMEGA-3 FISH OIL PO) Take 1 capsule by mouth daily.    [provider]  pantoprazole (PROTONIX) 40 MG tablet Take 40 mg by mouth daily. 01/14/22   [provider]  Pirfenidone (ESBRIET) 267 MG TABS Take 2 tablets (534 mg total) by mouth 3 (three) times daily with meals. **NOTE LOW DOSE due to kidney function** 11/08/21   Knox Saliva S, RPH-CPP  pravastatin (PRAVACHOL) 20 MG tablet Take 1 tablet (20 mg total) by mouth every evening. 02/11/22   [facility], Tami Lin, PA  rivaroxaban (XARELTO) 20 MG TABS tablet TAKE 1 TABLET DAILY WITH SUPPER 02/11/22   [facility], Tami Lin, PA  telmisartan (MICARDIS) 80 MG tablet Take 1 tablet (80 mg total) by mouth daily. 02/11/22   [facility], Tami Lin, PA  terbinafine (LAMISIL) 1 % cream Apply twice daily to buttocks and left thigh rash until clear 01/02/22   Moye, [place], MD  thyroid (ARMOUR) 90 MG tablet Take 90 mg by mouth every morning.     [provider]  traZODone (DESYREL) 50 MG tablet Take 1 tablet by mouth at bedtime. 02/21/22 02/21/23  [provider]  triamcinolone ointment (KENALOG) 0.1 % Apply twice daily to arms up to 2 weeks as needed. Avoid applying to face, groin, and axilla. 02/18/22   Moye, [place], MD  Trospium Chloride 60 MG CP24 TAKE 1 CAPSULE BY MOUTH DAILY 07/04/21   Stoioff, Ronda Fairly, MD  VTAMA 1 % CREA APPLY 1 APPLICATION TOPICALLY DAILY 04/01/22   Moye, [place], MD    Scheduled Meds:  sodium chloride   Intravenous Once   sodium chloride flush  3 mL Intravenous Q12H   Infusions:  pantoprazole (PROTONIX) IV     PRN Meds:    Allergies as of 05/23/2022 - Review Complete 05/23/2022  Allergen Reaction Noted   Ace inhibitors Swelling 02/29/2020   Lisinopril  08/14/2020   Celecoxib Rash 11/29/2014    Family History  Problem  Relation Age of Onset   Pancreatic cancer Mother    Bone cancer Father    Alzheimer's disease Sister    Heart attack Paternal Uncle    Arthritis Maternal Grandfather    Alzheimer's disease Paternal Grandmother    Lung cancer Paternal Grandfather     Social History   Socioeconomic History   Marital status: Married    Spouse name: Not on file   Number of children: Not on file   Years of education: Not on file   Highest education level: Not on file  Occupational History   Not on file  Tobacco Use   Smoking status: Former    Packs/day: 1.00    Years: 5.00    Total pack years: 5.00    Types: Cigarettes    Quit date: 07/10/1972    Years since quitting: 49.9   Smokeless tobacco: Former    Types: Chew    Quit date: 1975  Vaping Use   Vaping Use: Never used  Substance and Sexual Activity   Alcohol use: Yes    Alcohol/week: 1.0 standard drink of alcohol    Types: 1 Glasses of wine per week   Drug use: No   Sexual activity: Not Currently  Other Topics Concern   Not on file  Social History Narrative   Not on file   Social Determinants of Health   Financial Resource Strain: Not on file  Food Insecurity: Not on file  Transportation Needs: Not on file  Physical Activity: Not on file  Stress: Not on file  Social Connections: Not on file  Intimate Partner Violence: Not on file    REVIEW OF SYSTEMS: Constitutional: Fatigue, weakness as per HPI. ENT:  + dry mouth.  No nose bleeds Pulm: Stable DOE CV:  No palpitations, no LE edema.  No angina GU:  No hematuria, no frequency GI: See HPI. Heme: Denies unusual or excessive bleeding or bruising. Transfusions: None. Neuro: Dizziness but no syncope.  No seizures.  No headaches, no peripheral tingling or numbness Derm:  No itching, no rash or sores.  Endocrine:  No sweats or chills.  No polyuria or dysuria Immunization: Reviewed.  Up-to-date on multiple vaccinations. Travel: Not queried.   PHYSICAL EXAM: Vital signs in  last 24 hours: Vitals:   05/23/22 1022  BP: (!) 146/54  Pulse: 66  Resp: 16  Temp: 98.2 F (36.8 C)  SpO2: 97%   Wt Readings from Last 3 Encounters:  05/23/22 78 kg  05/06/22 78.7 kg  04/01/22 82.4 kg    General: Alert, pale.  Does not appear ill.  Resting on stretcher, comfortable. Head: No facial asymmetry or swelling.  No signs of head trauma. Eyes: Conjunctiva pale.  EOMI Ears: No obvious hearing deficit Nose: No congestion or discharge Mouth: Oral mucosa moist, pink, clear.  Good dentition.  Tongue midline. Neck: No JVD, no masses, no thyromegaly Lungs: Dry crackles in the bases bilaterally.  No labored breathing.  No cough. Heart: RRR with heart rate on monitor in the 60s.  No MRG.  S1, S2 present Abdomen: Not distended.  Soft without tenderness.  Active bowel sounds.  No HSM, masses, bruits, hernias..   Rectal: Deferred, not repeated.  Per ED physician Dr. Ashok Cordia, stool very dark/almost black.  It tested FOBT positive. Musc/Skeltl: No joint redness, swelling or gross deformity. Extremities: No CCE. Neurologic: Alert.  Oriented x 3.  Good historian.  Moves all 4 limbs without weakness or tremor. Skin: Pallor.  No obvious  telangiectasia.  No suspicious lesions, rashes, sores. Nodes: No cervical adenopathy. Psych: Calm, pleasant, cooperative.  Pleasant affect.  Intake/Output from previous day: No intake/output data recorded. Intake/Output this shift: No intake/output data recorded.  LAB RESULTS: Recent Labs    05/23/22 1216  WBC 8.7  HGB 6.0*  HCT 19.9*  PLT 172   BMET Lab Results  Component Value Date   NA 139 05/23/2022   NA 139 05/21/2022   NA 141 11/27/2021   K 4.6 05/23/2022   K 4.6 05/21/2022   K 3.7 11/27/2021   CL 109 05/23/2022   CL 106 05/21/2022   CL 105 11/27/2021   CO2 20 (L) 05/23/2022   CO2 27 05/21/2022   CO2 29 11/27/2021   GLUCOSE 138 (H) 05/23/2022   GLUCOSE 121 (H) 05/21/2022   GLUCOSE 141 (H) 11/27/2021   BUN 48 (H) 05/23/2022    BUN 49 (H) 05/21/2022   BUN 19 11/27/2021   CREATININE 1.22 05/23/2022   CREATININE 1.41 05/21/2022   CREATININE 1.31 11/27/2021   CALCIUM 8.7 (L) 05/23/2022   CALCIUM 9.3 05/21/2022   CALCIUM 9.3 11/27/2021   LFT Recent Labs    05/21/22 1208  PROT 6.4  ALBUMIN 3.6  AST 14  ALT 11  ALKPHOS 59  BILITOT 0.4  BILIDIR 0.1   PT/INR Lab Results  Component Value Date   INR 1.0 11/10/2018   INR 1.07 08/11/2017   INR 1.1 12/27/2015   Hepatitis Panel No results for input(s): "HEPBSAG", "HCVAB", "HEPAIGM", "HEPBIGM" in the last 72 hours. C-Diff No components found for: "CDIFF" Lipase  No results found for: "LIPASE"  Drugs of Abuse     Component Value Date/Time   LABOPIA NEGATIVE 12/09/2011 1046   COCAINSCRNUR NEGATIVE 12/09/2011 1046   LABBENZ NEGATIVE 12/09/2011 1046   AMPHETMU NEGATIVE 12/09/2011 [place] 12/09/2011 1046   LABBARB NEGATIVE 12/09/2011 1046     RADIOLOGY STUDIES: No results found.    IMPRESSION:     Macrocytic anemia.  Hgb down from 12.6 to 6 over 2 weeks    FOBT +.  Melenic stools.  On Protonix 40 mg po daily.      Chronic xarelto, low dose ASA for A fib.  Last dose was 12/28 (yesterday morning).    Azotemia, normal creatinine.  Suspect this is due to upper GI bleed associated protein load.    Adenomatous colon polyp(s) 2005.  No polyps 2013.  Cologuard negative 2021.  Set for colonoscopy 06/10/21    [place] (diminished taste sensation) for 2 months.      Dysphagia hx.  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         CAD.  Stented 07/2017.  No longer on Plavix.    DM 2 not on insulin.  A1c 8.1 in August 2023  PLAN:     EGD, tmrw.  Okay for clear liquids.  May benefit from dilation of esophagus at that time.      Agree w transfusing 2 PRBCs.    Patient received loading dose of 80 mg IV Protonix and is scheduled for Protonix 40 mg iv bid.       Ordered clear liquid diet.   NPO after midnightOrdered anemia panel.     Azucena Freed  05/23/2022, 1:02 PM Phone [phone]

## 2022-05-24 ENCOUNTER — Observation Stay (HOSPITAL_BASED_OUTPATIENT_CLINIC_OR_DEPARTMENT_OTHER): Payer: Medicare Other | Admitting: Certified Registered"

## 2022-05-24 ENCOUNTER — Encounter (HOSPITAL_COMMUNITY)
Admission: EM | Disposition: A | Discharge status: Home / Self Care | Payer: Self-pay | Source: Home / Self Care | Attending: Family Medicine

## 2022-05-24 ENCOUNTER — Observation Stay (HOSPITAL_COMMUNITY): Payer: Medicare Other | Admitting: Certified Registered"

## 2022-05-24 ENCOUNTER — Encounter (HOSPITAL_COMMUNITY): Payer: Self-pay | Admitting: Internal Medicine

## 2022-05-24 DIAGNOSIS — K222 Esophageal obstruction: Secondary | ICD-10-CM | POA: Diagnosis present

## 2022-05-24 DIAGNOSIS — G4733 Obstructive sleep apnea (adult) (pediatric): Secondary | ICD-10-CM | POA: Diagnosis present

## 2022-05-24 DIAGNOSIS — K315 Obstruction of duodenum: Secondary | ICD-10-CM | POA: Diagnosis present

## 2022-05-24 DIAGNOSIS — I2510 Atherosclerotic heart disease of native coronary artery without angina pectoris: Secondary | ICD-10-CM | POA: Diagnosis present

## 2022-05-24 DIAGNOSIS — K922 Gastrointestinal hemorrhage, unspecified: Secondary | ICD-10-CM | POA: Diagnosis present

## 2022-05-24 DIAGNOSIS — N1832 Chronic kidney disease, stage 3b: Secondary | ICD-10-CM | POA: Diagnosis present

## 2022-05-24 DIAGNOSIS — D62 Acute posthemorrhagic anemia: Secondary | ICD-10-CM | POA: Diagnosis not present

## 2022-05-24 DIAGNOSIS — Z87891 Personal history of nicotine dependence: Secondary | ICD-10-CM

## 2022-05-24 DIAGNOSIS — I509 Heart failure, unspecified: Secondary | ICD-10-CM

## 2022-05-24 DIAGNOSIS — R1310 Dysphagia, unspecified: Secondary | ICD-10-CM | POA: Diagnosis not present

## 2022-05-24 DIAGNOSIS — I129 Hypertensive chronic kidney disease with stage 1 through stage 4 chronic kidney disease, or unspecified chronic kidney disease: Secondary | ICD-10-CM | POA: Diagnosis present

## 2022-05-24 DIAGNOSIS — E785 Hyperlipidemia, unspecified: Secondary | ICD-10-CM | POA: Diagnosis present

## 2022-05-24 DIAGNOSIS — K921 Melena: Secondary | ICD-10-CM | POA: Diagnosis not present

## 2022-05-24 DIAGNOSIS — N401 Enlarged prostate with lower urinary tract symptoms: Secondary | ICD-10-CM | POA: Diagnosis not present

## 2022-05-24 DIAGNOSIS — D509 Iron deficiency anemia, unspecified: Secondary | ICD-10-CM | POA: Diagnosis not present

## 2022-05-24 DIAGNOSIS — J069 Acute upper respiratory infection, unspecified: Secondary | ICD-10-CM | POA: Diagnosis present

## 2022-05-24 DIAGNOSIS — Z7901 Long term (current) use of anticoagulants: Secondary | ICD-10-CM | POA: Diagnosis not present

## 2022-05-24 DIAGNOSIS — I4891 Unspecified atrial fibrillation: Secondary | ICD-10-CM | POA: Diagnosis not present

## 2022-05-24 DIAGNOSIS — M069 Rheumatoid arthritis, unspecified: Secondary | ICD-10-CM | POA: Diagnosis present

## 2022-05-24 DIAGNOSIS — K5909 Other constipation: Secondary | ICD-10-CM | POA: Diagnosis present

## 2022-05-24 DIAGNOSIS — J449 Chronic obstructive pulmonary disease, unspecified: Secondary | ICD-10-CM | POA: Diagnosis present

## 2022-05-24 DIAGNOSIS — R195 Other fecal abnormalities: Secondary | ICD-10-CM | POA: Diagnosis not present

## 2022-05-24 DIAGNOSIS — J849 Interstitial pulmonary disease, unspecified: Secondary | ICD-10-CM | POA: Diagnosis present

## 2022-05-24 DIAGNOSIS — Z1152 Encounter for screening for COVID-19: Secondary | ICD-10-CM | POA: Diagnosis not present

## 2022-05-24 DIAGNOSIS — I442 Atrioventricular block, complete: Secondary | ICD-10-CM | POA: Diagnosis not present

## 2022-05-24 DIAGNOSIS — I25119 Atherosclerotic heart disease of native coronary artery with unspecified angina pectoris: Secondary | ICD-10-CM | POA: Diagnosis not present

## 2022-05-24 DIAGNOSIS — K648 Other hemorrhoids: Secondary | ICD-10-CM | POA: Diagnosis present

## 2022-05-24 DIAGNOSIS — D539 Nutritional anemia, unspecified: Secondary | ICD-10-CM | POA: Diagnosis present

## 2022-05-24 DIAGNOSIS — L4050 Arthropathic psoriasis, unspecified: Secondary | ICD-10-CM | POA: Diagnosis present

## 2022-05-24 DIAGNOSIS — E1122 Type 2 diabetes mellitus with diabetic chronic kidney disease: Secondary | ICD-10-CM | POA: Diagnosis present

## 2022-05-24 DIAGNOSIS — K5731 Diverticulosis of large intestine without perforation or abscess with bleeding: Secondary | ICD-10-CM | POA: Diagnosis present

## 2022-05-24 DIAGNOSIS — E039 Hypothyroidism, unspecified: Secondary | ICD-10-CM | POA: Diagnosis present

## 2022-05-24 DIAGNOSIS — R509 Fever, unspecified: Secondary | ICD-10-CM | POA: Diagnosis not present

## 2022-05-24 DIAGNOSIS — D649 Anemia, unspecified: Secondary | ICD-10-CM | POA: Diagnosis present

## 2022-05-24 DIAGNOSIS — I110 Hypertensive heart disease with heart failure: Secondary | ICD-10-CM

## 2022-05-24 DIAGNOSIS — D500 Iron deficiency anemia secondary to blood loss (chronic): Secondary | ICD-10-CM | POA: Diagnosis not present

## 2022-05-24 HISTORY — PX: ESOPHAGOGASTRODUODENOSCOPY (EGD) WITH PROPOFOL: SHX5813

## 2022-05-24 HISTORY — PX: BALLOON DILATION: SHX5330

## 2022-05-24 LAB — CBC
HCT: 22.8 % — ABNORMAL LOW (ref 39.0–52.0)
HCT: 23 % — ABNORMAL LOW (ref 39.0–52.0)
HCT: 25.6 % — ABNORMAL LOW (ref 39.0–52.0)
Hemoglobin: 7.4 g/dL — ABNORMAL LOW (ref 13.0–17.0)
Hemoglobin: 7.9 g/dL — ABNORMAL LOW (ref 13.0–17.0)
Hemoglobin: 8.5 g/dL — ABNORMAL LOW (ref 13.0–17.0)
MCH: 30.5 pg (ref 26.0–34.0)
MCH: 31.3 pg (ref 26.0–34.0)
MCH: 31.3 pg (ref 26.0–34.0)
MCHC: 32.5 g/dL (ref 30.0–36.0)
MCHC: 33.2 g/dL (ref 30.0–36.0)
MCHC: 34.3 g/dL (ref 30.0–36.0)
MCV: 91.3 fL (ref 80.0–100.0)
MCV: 93.8 fL (ref 80.0–100.0)
MCV: 94.1 fL (ref 80.0–100.0)
Platelets: 134 10*3/uL — ABNORMAL LOW (ref 150–400)
Platelets: 166 10*3/uL (ref 150–400)
Platelets: 179 10*3/uL (ref 150–400)
RBC: 2.43 MIL/uL — ABNORMAL LOW (ref 4.22–5.81)
RBC: 2.52 MIL/uL — ABNORMAL LOW (ref 4.22–5.81)
RBC: 2.72 MIL/uL — ABNORMAL LOW (ref 4.22–5.81)
RDW: 18.9 % — ABNORMAL HIGH (ref 11.5–15.5)
RDW: 19.2 % — ABNORMAL HIGH (ref 11.5–15.5)
RDW: 19.9 % — ABNORMAL HIGH (ref 11.5–15.5)
WBC: 8 10*3/uL (ref 4.0–10.5)
WBC: 8.2 10*3/uL (ref 4.0–10.5)
WBC: 8.2 10*3/uL (ref 4.0–10.5)
nRBC: 0 % (ref 0.0–0.2)
nRBC: 0.2 % (ref 0.0–0.2)
nRBC: 0.4 % — ABNORMAL HIGH (ref 0.0–0.2)

## 2022-05-24 LAB — COMPREHENSIVE METABOLIC PANEL
ALT: 11 U/L (ref 0–44)
AST: 15 U/L (ref 15–41)
Albumin: 2.6 g/dL — ABNORMAL LOW (ref 3.5–5.0)
Alkaline Phosphatase: 45 U/L (ref 38–126)
Anion gap: 9 (ref 5–15)
BUN: 33 mg/dL — ABNORMAL HIGH (ref 8–23)
CO2: 23 mmol/L (ref 22–32)
Calcium: 8.7 mg/dL — ABNORMAL LOW (ref 8.9–10.3)
Chloride: 108 mmol/L (ref 98–111)
Creatinine, Ser: 1.09 mg/dL (ref 0.61–1.24)
GFR, Estimated: >60 mL/min (ref >60)
Glucose, Bld: 92 mg/dL (ref 70–99)
Potassium: 4 mmol/L (ref 3.5–5.1)
Sodium: 140 mmol/L (ref 135–145)
Total Bilirubin: 0.7 mg/dL (ref 0.3–1.2)
Total Protein: 5 g/dL — ABNORMAL LOW (ref 6.5–8.1)

## 2022-05-24 LAB — GLUCOSE, CAPILLARY
Glucose-Capillary: 94 mg/dL (ref 70–99)
Glucose-Capillary: 133 mg/dL — ABNORMAL HIGH (ref 70–99)
Glucose-Capillary: 66 mg/dL — ABNORMAL LOW (ref 70–99)
Glucose-Capillary: 90 mg/dL (ref 70–99)
Glucose-Capillary: 85 mg/dL (ref 70–99)

## 2022-05-24 SURGERY — ESOPHAGOGASTRODUODENOSCOPY (EGD) WITH PROPOFOL
Anesthesia: Monitor Anesthesia Care

## 2022-05-24 MED ORDER — PEG-KCL-NACL-NASULF-NA ASC-C 100 G PO SOLR: 1.0000 | Freq: Once | ORAL | Status: DC

## 2022-05-24 MED ORDER — PROPOFOL 500 MG/50ML IV EMUL
INTRAVENOUS | Status: DC | PRN
  Administered 2022-05-24: 75 ug/kg/min via INTRAVENOUS

## 2022-05-24 MED ORDER — PEG-KCL-NACL-NASULF-NA ASC-C 100 G PO SOLR
0.5000 | Freq: Once | ORAL | Status: AC
  Administered 2022-05-24: 100 g via ORAL
  Filled 2022-05-24: qty 1

## 2022-05-24 MED ORDER — LIDOCAINE 2% (20 MG/ML) 5 ML SYRINGE
INTRAMUSCULAR | Status: DC | PRN
  Administered 2022-05-24: 60 mg via INTRAVENOUS

## 2022-05-24 MED ORDER — PHENYLEPHRINE 80 MCG/ML (10ML) SYRINGE FOR IV PUSH (FOR BLOOD PRESSURE SUPPORT)
PREFILLED_SYRINGE | INTRAVENOUS | Status: DC | PRN
  Administered 2022-05-24: 80 ug via INTRAVENOUS

## 2022-05-24 MED ORDER — PEG-KCL-NACL-NASULF-NA ASC-C 100 G PO SOLR
0.5000 | Freq: Once | ORAL | Status: AC
  Administered 2022-05-25: 100 g via ORAL

## 2022-05-24 MED ORDER — PROPOFOL 10 MG/ML IV BOLUS
INTRAVENOUS | Status: DC | PRN
  Administered 2022-05-24: 10 mg via INTRAVENOUS

## 2022-05-24 SURGICAL SUPPLY — 15 items

## 2022-05-24 NOTE — Hospital Course (Signed)
Cameron [name] is a 81 y.o. male with a history of psoriatic arthritis, rheumatoid arthritis, OSA, hypothyroidism, hyperlipidemia, CAD, atrial fibrillation, CKD stage IIIb, complete heart block s/p PPM, ILD, hypertension, BPH s/p TURP and diabetes. Patient presented secondary to weakness and lightheadedness with a history of black/dark stools and found to have focal occult positive stool and anemia requiring blood transfusion. [facility] consulted and performed upper endoscopy, without identification of bleeding source.

## 2022-05-24 NOTE — Op Note (Signed)
[facility] Patient [name] Procedure Date : 05/24/2022 MRN: [id number] Attending MD: Georgian Co , , [id number] Date of Birth: [birth year] CSN: [id number] Age: 81 Admit Type: Inpatient Procedure:                Upper GI endoscopy Indications:              Dysphagia, Melena Providers:                Adline Mango" Carmin Richmond, RN,                            Cletis Athens, Technician Referring MD:             Hospitalist team Medicines:                Monitored Anesthesia Care Complications:            No immediate complications. Estimated Blood Loss:     Estimated blood loss was minimal. Procedure:                Pre-Anesthesia Assessment:                           - Prior to the procedure, a History and Physical                            was performed, and patient medications and                            allergies were reviewed. The patient's tolerance of                            previous anesthesia was also reviewed. The risks                            and benefits of the procedure and the sedation                            options and risks were discussed with the patient.                            All questions were answered, and informed consent                            was obtained. Prior Anticoagulants: The patient has                            taken no anticoagulant or antiplatelet agents. ASA                            Grade Assessment: III - A patient with severe                            systemic disease. After reviewing the risks and  benefits, the patient was deemed in satisfactory                            condition to undergo the procedure.                           After obtaining informed consent, the endoscope was                            passed under direct vision. Throughout the                            procedure, the patient's blood pressure, pulse, and                             oxygen saturations were monitored continuously. The                            [id number]-[id number] ([id number] Olympus endoscope was introduced                            through the mouth, and advanced to the second part                            of duodenum. The upper GI endoscopy was                            accomplished without difficulty. The patient                            tolerated the procedure well. Scope In: Scope Out: Findings:      One benign-appearing, intrinsic moderate (circumferential scarring or       stenosis; an endoscope may pass) stenosis was found at the       gastroesophageal junction. This stenosis measured less than one cm (in       length). The stenosis was traversed. A TTS dilator was passed through       the scope. Dilation with a 16-17-18 mm balloon dilator was performed to       18 mm.      The entire examined stomach was normal.      An acquired benign-appearing, intrinsic stenosis was found in the       duodenal bulb and was traversed. Impression:               - Benign-appearing esophageal stenosis. Dilated.                           - Normal stomach.                           - Acquired duodenal stenosis.                           - No specimens collected. Recommendation:           - Return patient to hospital ward for ongoing care.                           -  Perform a colonoscopy tomorrow.                           - The findings and recommendations were discussed                            with the patient. Procedure Code(s):        --- Professional ---                           [phone], Esophagogastroduodenoscopy, flexible,                            transoral; with transendoscopic balloon dilation of                            esophagus (less than 30 mm diameter) Diagnosis Code(s):        --- Professional ---                           K22.2, Esophageal obstruction                           K31.5, Obstruction of duodenum                            R13.10, Dysphagia, unspecified                           K92.1, Melena (includes Hematochezia) CPT copyright 2022 American Medical Association. All rights reserved. The codes documented in this report are preliminary and upon coder review may  be revised to meet current compliance requirements. Dr Georgian Co "Lyndee Leo" Lorenso Courier,  05/24/2022 10:18:55 AM Number of Addenda: 0

## 2022-05-24 NOTE — Anesthesia Preprocedure Evaluation (Signed)
Anesthesia Evaluation  Patient identified by MRN, date of birth, ID band Patient awake    Reviewed: Allergy & Precautions, NPO status , Patient's Chart, lab work & pertinent test results  Airway Mallampati: II  TM Distance: >3 FB Neck ROM: Full    Dental   Pulmonary sleep apnea , former smoker   Pulmonary exam normal        Cardiovascular hypertension, Pt. on medications and Pt. on home beta blockers + CAD and +CHF  Normal cardiovascular exam+ dysrhythmias (CHB) + pacemaker      Neuro/Psych negative neurological ROS     GI/Hepatic negative GI ROS, Neg liver ROS,,,GI bleed    Endo/Other  diabetesHypothyroidism    Renal/GU Renal InsufficiencyRenal disease     Musculoskeletal  (+) Arthritis ,    Abdominal   Peds  Hematology  (+) Blood dyscrasia, anemia   Anesthesia Other Findings   Reproductive/Obstetrics                             Anesthesia Physical Anesthesia Plan  ASA: 4  Anesthesia Plan: MAC   Post-op Pain Management:    Induction:   PONV Risk Score and Plan: 1 and Propofol infusion  Airway Management Planned: Natural Airway and Nasal Cannula  Additional Equipment:   Intra-op Plan:   Post-operative Plan:   Informed Consent: I have reviewed the patients History and Physical, chart, labs and discussed the procedure including the risks, benefits and alternatives for the proposed anesthesia with the patient or authorized representative who has indicated his/her understanding and acceptance.       Plan Discussed with:   Anesthesia Plan Comments:        Anesthesia Quick Evaluation

## 2022-05-24 NOTE — Anesthesia Postprocedure Evaluation (Signed)
Anesthesia Post Note  Patient: [name]  Procedure(s) Performed: ESOPHAGOGASTRODUODENOSCOPY (EGD) WITH PROPOFOL BALLOON DILATION     Patient location during evaluation: PACU Anesthesia Type: MAC Level of consciousness: awake and alert Pain management: pain level controlled Vital Signs Assessment: post-procedure vital signs reviewed and stable Respiratory status: spontaneous breathing, nonlabored ventilation, respiratory function stable and patient connected to nasal cannula oxygen Cardiovascular status: stable and blood pressure returned to baseline Postop Assessment: no apparent nausea or vomiting Anesthetic complications: no  No notable events documented.  Last Vitals:  Vitals:   05/24/22 1021 05/24/22 1228  BP: (!) 137/57 (!) 134/46  Pulse: 67   Resp: 20   Temp:    SpO2: 99%     Last Pain:  Vitals:   05/24/22 1021  TempSrc:   PainSc: 0-No pain                 Tiajuana Amass

## 2022-05-24 NOTE — Progress Notes (Signed)
PROGRESS NOTE    Cameron [name]  TDD:[id number] DOB: [birth year] DOA: 05/23/2022 PCP: [name], MD   Brief Narrative: Cameron [name] is a 81 y.o. male with a history of psoriatic arthritis, rheumatoid arthritis, OSA, hypothyroidism, hyperlipidemia, CAD, atrial fibrillation, CKD stage IIIb, complete heart block s/p PPM, ILD, hypertension, BPH s/p TURP and diabetes. Patient presented secondary to weakness and lightheadedness with a history of black/dark stools and found to have focal occult positive stool and anemia requiring blood transfusion. [facility] consulted and performed upper endoscopy, without identification of bleeding source.   Assessment and Plan:  Melena Black/dark stools. Fecal occult test positive in the ED. Associated anemia. [facility] consulted and performed upper endoscopy on 12/30 which was significant for benign appearing esophageal stenosis, normal stomach and acquired duodenal stenosis. -GI recommendations: plan for colonoscopy on 12/31  Acute blood loss anemia Baseline hemoglobin is about 11-12. Hemoglobin of 6.0 on admission requiring transfusion of 2 units of PRBC. Post-transfusion hemoglobin of 7.9 with downward drift to 7.4 this morning. Secondary to GI bleed. -H&H this afternoon and CBC tomorrow morning -Transfuse for hemoglobin <7  Psoriatic arthritis Rheumatoid arthritis -Continue home leflunomide  OSA -Continue CPAP  Hypothyroidism -Continue thyroid 90 mg daily  Hyperlipidemia -Continue pravastatin  Esophageal stenosis Benign appearing. Dilated by GI on 12/30.  CAD History of PCI with DES in 2019. Patient is on aspirin and Xarelto as an outpatient. Aspirin and Xarelto held.  Atrial fibrillation, unspecified Patient is on diltiazem, nebivolol and Xarelto as an outpatient -Continue diltiazem and nebivolol  Primary hypertension Patient is on diltiazem, nebivolol, hydrochlorothiazide and telmisartan as an outpatient. -Continue  diltiazem, nebivolol and telmisartan (irbesartan while inpatient)  CKD stage IIIb Recent baseline creatinine is about 1.3. Creatinine of 1.22 on admission and down to 1.09.  Complete heart block S/p PPM  ILD Noted. Patient follows with pulmonology -Continue Pirfenidone  BPH Noted. History of TURP.  Diabetes mellitus, type 2 Slightly uncontrolled with hyperglycemia for age with hemoglobin A1C of 8.1%. patient is on Jardiance and glimepiride for management as an outpatient. Started on SSI inpatient. -Continue SSI    DVT prophylaxis: SCDs Code Status:   Code Status: Full Code Family Communication: Wife, daughter, son-in-law at bedside Disposition Plan: Discharge home likely in 1-2 days if bleeding stops, hemoglobin stabilizes and pending GI recommendations   Consultants:  [facility]  Procedures:  12/30: Upper endoscopy  Antimicrobials: None    Subjective: Patient reports no bowel movement since admission. No issues overnight.  Objective: BP (!) 127/52 (BP Location: Right Arm)   Pulse 63   Temp 98.6 F (37 C) (Oral)   Resp 18   Ht _0  (1.702 m)   Wt 78 kg   SpO2 99%   BMI 26.94 kg/m   Examination:  General exam: Appears calm and comfortable Respiratory system: Clear to auscultation. Respiratory effort normal. Cardiovascular system: S1 & S2 heard, RRR. No murmurs, rubs, gallops or clicks. Gastrointestinal system: Abdomen is nondistended, soft and nontender. Normal bowel sounds heard. Central nervous system: Alert and oriented. No focal neurological deficits. Musculoskeletal: No edema. No calf tenderness Skin: No cyanosis. No rashes Psychiatry: Judgement and insight appear normal. Mood & affect appropriate.    Data Reviewed: I have personally reviewed following labs and imaging studies  CBC Lab Results  Component Value Date   WBC 8.0 05/24/2022   RBC 2.43 (L) 05/24/2022   HGB 7.4 (L) 05/24/2022   HCT 22.8 (L) 05/24/2022   MCV 93.8  05/24/2022   MCH 30.5 05/24/2022   PLT 166 05/24/2022   MCHC 32.5 05/24/2022   RDW 19.2 (H) 05/24/2022   LYMPHSABS 2.6 07/25/2020   MONOABS 0.7 11/10/2018   EOSABS 0.3 07/25/2020   BASOSABS 0.1 27/10/2374     Last metabolic panel Lab Results  Component Value Date   NA 140 05/24/2022   K 4.0 05/24/2022   CL 108 05/24/2022   CO2 23 05/24/2022   BUN 33 (H) 05/24/2022   CREATININE 1.09 05/24/2022   GLUCOSE 92 05/24/2022   GFRNONAA >60 05/24/2022   GFRAA >60 08/02/2019   CALCIUM 8.7 (L) 05/24/2022   PHOS 3.0 12/09/2011   PROT 5.0 (L) 05/24/2022   ALBUMIN 2.6 (L) 05/24/2022   BILITOT 0.7 05/24/2022   ALKPHOS 45 05/24/2022   AST 15 05/24/2022   ALT 11 05/24/2022   ANIONGAP 9 05/24/2022    GFR: Estimated Creatinine Clearance: 48.9 mL/min (by C-G formula based on SCr of 1.09 mg/dL).  Recent Results (from the past 240 hour(s))  Resp panel by RT-PCR (RSV, Flu A&B, Covid) Anterior Nasal Swab     Status: None   Collection Time: 05/23/22  2:46 PM   Specimen: Anterior Nasal Swab  Result Value Ref Range Status   SARS Coronavirus 2 by RT PCR NEGATIVE NEGATIVE Final    Comment: (NOTE) SARS-CoV-2 target nucleic acids are NOT DETECTED.  The SARS-CoV-2 RNA is generally detectable in upper respiratory specimens during the acute phase of infection. The lowest concentration of SARS-CoV-2 viral copies this assay can detect is 138 copies/mL. A negative result does not preclude SARS-Cov-2 infection and should not be used as the sole basis for treatment or other patient management decisions. A negative result may occur with  improper specimen collection/handling, submission of specimen other than nasopharyngeal swab, presence of viral mutation(s) within the areas targeted by this assay, and inadequate number of viral copies(<138 copies/mL). A negative result must be combined with clinical observations, patient history, and epidemiological information. The expected result is  Negative.  Fact Sheet for Patients:  EntrepreneurPulse.com.au  Fact Sheet for Healthcare Providers:  IncredibleEmployment.be  This test is no t yet approved or cleared by the [place] FDA and  has been authorized for detection and/or diagnosis of SARS-CoV-2 by FDA under an Emergency Use Authorization (EUA). This EUA will remain  in effect (meaning this test can be used) for the duration of the COVID-19 declaration under Section 564(b)(1) of the Act, 21 U.S.C.section 360bbb-3(b)(1), unless the authorization is terminated  or revoked sooner.       Influenza A by PCR NEGATIVE NEGATIVE Final   Influenza B by PCR NEGATIVE NEGATIVE Final    Comment: (NOTE) The Xpert Xpress SARS-CoV-2/FLU/RSV plus assay is intended as an aid in the diagnosis of influenza from Nasopharyngeal swab specimens and should not be used as a sole basis for treatment. Nasal washings and aspirates are unacceptable for Xpert Xpress SARS-CoV-2/FLU/RSV testing.  Fact Sheet for Patients: EntrepreneurPulse.com.au  Fact Sheet for Healthcare Providers: IncredibleEmployment.be  This test is not yet approved or cleared by the [place] FDA and has been authorized for detection and/or diagnosis of SARS-CoV-2 by FDA under an Emergency Use Authorization (EUA). This EUA will remain in effect (meaning this test can be used) for the duration of the COVID-19 declaration under Section 564(b)(1) of the Act, 21 U.S.C. section 360bbb-3(b)(1), unless the authorization is terminated or revoked.     Resp Syncytial Virus by PCR NEGATIVE NEGATIVE Final    Comment: (NOTE)  Fact Sheet for Patients: EntrepreneurPulse.com.au  Fact Sheet for Healthcare Providers: IncredibleEmployment.be  This test is not yet approved or cleared by the [place] FDA and has been authorized for detection and/or diagnosis of  SARS-CoV-2 by FDA under an Emergency Use Authorization (EUA). This EUA will remain in effect (meaning this test can be used) for the duration of the COVID-19 declaration under Section 564(b)(1) of the Act, 21 U.S.C. section 360bbb-3(b)(1), unless the authorization is terminated or revoked.  Performed at [facility], [place] [address], [place], [place] [address]       Radiology Studies: No results found.    LOS: 0 days    Cordelia Poche, MD [facility] 05/24/2022, 4:50 PM   If 7PM-7AM, please contact night-coverage [email or web address]

## 2022-05-24 NOTE — Interval H&P Note (Signed)
History and Physical Interval Note:  05/24/2022 9:29 AM  Cameron [name]  has presented today for surgery, with the diagnosis of Evaluate for source of bleeding and blood loss anemia.  Possibly stretch esophagus if any narrowing is encountered..  The various methods of treatment have been discussed with the patient and family. After consideration of risks, benefits and other options for treatment, the patient has consented to  Procedure(s): ESOPHAGOGASTRODUODENOSCOPY (EGD) WITH PROPOFOL (N/A) BALLOON DILATION (N/A) as a surgical intervention.  The patient's history has been reviewed, patient examined, no change in status, stable for surgery.  I have reviewed the patient's chart and labs.  Questions were answered to the patient's satisfaction.     Sharyn Creamer

## 2022-05-24 NOTE — Transfer of Care (Signed)
Immediate Anesthesia Transfer of Care Note  Patient: Cameron [name]  Procedure(s) Performed: ESOPHAGOGASTRODUODENOSCOPY (EGD) WITH PROPOFOL BALLOON DILATION  Patient Location: PACU  Anesthesia Type:MAC  Level of Consciousness: awake, alert , and oriented  Airway & Oxygen Therapy: Patient Spontanous Breathing  Post-op Assessment: Report given to RN and Post -op Vital signs reviewed and stable  Post vital signs: Reviewed and stable  Last Vitals:  Vitals Value Taken Time  BP 102/38 05/24/22 1006  Temp 36.5 C 05/24/22 1006  Pulse 64 05/24/22 1008  Resp 22 05/24/22 1008  SpO2 99 % 05/24/22 1008  Vitals shown include unvalidated device data.  Last Pain:  Vitals:   05/24/22 1006  TempSrc: Temporal  PainSc: Asleep         Complications: No notable events documented.

## 2022-05-25 ENCOUNTER — Encounter (HOSPITAL_COMMUNITY): Payer: Self-pay | Admitting: Internal Medicine

## 2022-05-25 ENCOUNTER — Inpatient Hospital Stay (HOSPITAL_COMMUNITY): Payer: Medicare Other | Admitting: Certified Registered Nurse Anesthetist

## 2022-05-25 ENCOUNTER — Encounter (HOSPITAL_COMMUNITY)
Admission: EM | Disposition: A | Discharge status: Home / Self Care | Payer: Self-pay | Source: Home / Self Care | Attending: Family Medicine

## 2022-05-25 DIAGNOSIS — I25119 Atherosclerotic heart disease of native coronary artery with unspecified angina pectoris: Secondary | ICD-10-CM

## 2022-05-25 DIAGNOSIS — K648 Other hemorrhoids: Secondary | ICD-10-CM | POA: Diagnosis not present

## 2022-05-25 DIAGNOSIS — I442 Atrioventricular block, complete: Secondary | ICD-10-CM | POA: Diagnosis not present

## 2022-05-25 DIAGNOSIS — I110 Hypertensive heart disease with heart failure: Secondary | ICD-10-CM

## 2022-05-25 DIAGNOSIS — D509 Iron deficiency anemia, unspecified: Secondary | ICD-10-CM

## 2022-05-25 DIAGNOSIS — I4891 Unspecified atrial fibrillation: Secondary | ICD-10-CM | POA: Diagnosis not present

## 2022-05-25 DIAGNOSIS — K921 Melena: Secondary | ICD-10-CM

## 2022-05-25 DIAGNOSIS — K5731 Diverticulosis of large intestine without perforation or abscess with bleeding: Secondary | ICD-10-CM

## 2022-05-25 DIAGNOSIS — Z87891 Personal history of nicotine dependence: Secondary | ICD-10-CM

## 2022-05-25 DIAGNOSIS — K922 Gastrointestinal hemorrhage, unspecified: Secondary | ICD-10-CM | POA: Diagnosis not present

## 2022-05-25 DIAGNOSIS — I509 Heart failure, unspecified: Secondary | ICD-10-CM

## 2022-05-25 DIAGNOSIS — N401 Enlarged prostate with lower urinary tract symptoms: Secondary | ICD-10-CM | POA: Diagnosis not present

## 2022-05-25 HISTORY — PX: GIVENS CAPSULE STUDY: SHX5432

## 2022-05-25 HISTORY — PX: COLONOSCOPY WITH PROPOFOL: SHX5780

## 2022-05-25 LAB — CBC
HCT: 26.4 % — ABNORMAL LOW (ref 39.0–52.0)
Hemoglobin: 8.7 g/dL — ABNORMAL LOW (ref 13.0–17.0)
MCH: 31.3 pg (ref 26.0–34.0)
MCHC: 33 g/dL (ref 30.0–36.0)
MCV: 95 fL (ref 80.0–100.0)
Platelets: 185 10*3/uL (ref 150–400)
RBC: 2.78 MIL/uL — ABNORMAL LOW (ref 4.22–5.81)
RDW: 19.5 % — ABNORMAL HIGH (ref 11.5–15.5)
WBC: 7.1 10*3/uL (ref 4.0–10.5)
nRBC: 0 % (ref 0.0–0.2)

## 2022-05-25 LAB — GLUCOSE, CAPILLARY
Glucose-Capillary: 95 mg/dL (ref 70–99)
Glucose-Capillary: 97 mg/dL (ref 70–99)
Glucose-Capillary: 86 mg/dL (ref 70–99)
Glucose-Capillary: 124 mg/dL — ABNORMAL HIGH (ref 70–99)
Glucose-Capillary: 224 mg/dL — ABNORMAL HIGH (ref 70–99)

## 2022-05-25 LAB — BASIC METABOLIC PANEL
Anion gap: 4 — ABNORMAL LOW (ref 5–15)
BUN: 24 mg/dL — ABNORMAL HIGH (ref 8–23)
CO2: 25 mmol/L (ref 22–32)
Calcium: 9 mg/dL (ref 8.9–10.3)
Chloride: 109 mmol/L (ref 98–111)
Creatinine, Ser: 1.04 mg/dL (ref 0.61–1.24)
GFR, Estimated: >60 mL/min (ref >60)
Glucose, Bld: 101 mg/dL — ABNORMAL HIGH (ref 70–99)
Potassium: 4.4 mmol/L (ref 3.5–5.1)
Sodium: 138 mmol/L (ref 135–145)

## 2022-05-25 SURGERY — IMAGING PROCEDURE, GI TRACT, INTRALUMINAL, VIA CAPSULE

## 2022-05-25 SURGERY — COLONOSCOPY WITH PROPOFOL
Anesthesia: Monitor Anesthesia Care

## 2022-05-25 MED ORDER — LACTATED RINGERS IV SOLN
INTRAVENOUS | Status: AC | PRN
  Administered 2022-05-25: 10 mL/h via INTRAVENOUS

## 2022-05-25 MED ORDER — LIDOCAINE 2% (20 MG/ML) 5 ML SYRINGE
INTRAMUSCULAR | Status: DC | PRN
  Administered 2022-05-25: 80 mg via INTRAVENOUS

## 2022-05-25 MED ORDER — SODIUM CHLORIDE 0.9 % IV SOLN: INTRAVENOUS | Status: DC

## 2022-05-25 MED ORDER — PROPOFOL 10 MG/ML IV BOLUS
INTRAVENOUS | Status: DC | PRN
  Administered 2022-05-25: 50 mg via INTRAVENOUS

## 2022-05-25 MED ORDER — PHENYLEPHRINE 80 MCG/ML (10ML) SYRINGE FOR IV PUSH (FOR BLOOD PRESSURE SUPPORT)
PREFILLED_SYRINGE | INTRAVENOUS | Status: DC | PRN
  Administered 2022-05-25 (×3): 160 ug via INTRAVENOUS
  Administered 2022-05-25: 80 ug via INTRAVENOUS

## 2022-05-25 MED ORDER — PROPOFOL 500 MG/50ML IV EMUL
INTRAVENOUS | Status: DC | PRN
  Administered 2022-05-25: 75 ug/kg/min via INTRAVENOUS

## 2022-05-25 MED ORDER — RIVAROXABAN 20 MG PO TABS
20.0000 mg | ORAL_TABLET | Freq: Every day | ORAL | Status: DC
  Administered 2022-05-25: 20 mg via ORAL
  Filled 2022-05-25: qty 1

## 2022-05-25 MED ORDER — GLYCOPYRROLATE 0.2 MG/ML IJ SOLN
INTRAMUSCULAR | Status: DC | PRN
  Administered 2022-05-25: .2 mg via INTRAVENOUS

## 2022-05-25 SURGICAL SUPPLY — 22 items

## 2022-05-25 NOTE — Anesthesia Postprocedure Evaluation (Signed)
Anesthesia Post Note  Patient: Cameron [name]  Procedure(s) Performed: COLONOSCOPY WITH PROPOFOL     Patient location during evaluation: PACU Anesthesia Type: MAC Level of consciousness: awake and alert Pain management: pain level controlled Vital Signs Assessment: post-procedure vital signs reviewed and stable Respiratory status: spontaneous breathing, nonlabored ventilation, respiratory function stable and patient connected to nasal cannula oxygen Cardiovascular status: stable and blood pressure returned to baseline Postop Assessment: no apparent nausea or vomiting Anesthetic complications: no   No notable events documented.  Last Vitals:  Vitals:   05/25/22 1403 05/25/22 1544  BP: (!) 127/45 (!) 145/51  Pulse: 60 62  Resp:  16  Temp:  36.6 C  SpO2: 100% 100%    Last Pain:  Vitals:   05/25/22 1544  TempSrc: Oral  PainSc:                  March Rummage Yelina Sarratt

## 2022-05-25 NOTE — Progress Notes (Signed)
PROGRESS NOTE    Cameron [name]  RXY:[id number] DOB: 03-22-1941 DOA: 05/23/2022 PCP: [name], MD   Brief Narrative: Cameron [name] is a 81 y.o. male with a history of psoriatic arthritis, rheumatoid arthritis, OSA, hypothyroidism, hyperlipidemia, CAD, atrial fibrillation, CKD stage IIIb, complete heart block s/p PPM, ILD, hypertension, BPH s/p TURP and diabetes. Patient presented secondary to weakness and lightheadedness with a history of black/dark stools and found to have focal occult positive stool and anemia requiring blood transfusion. [name] consulted and performed upper endoscopy, without identification of bleeding source.   Assessment and Plan:  Melena Black/dark stools. Fecal occult test positive in the ED. Associated anemia. [facility] consulted and performed upper endoscopy on 12/30 which was significant for benign appearing esophageal stenosis, normal stomach and acquired duodenal stenosis. Colonoscopy performed on 12/31 was significant for diverticulosis and non-bleeding internal hemorrhoids. -GI recommendations: video capsule placed on 12/31  Acute blood loss anemia Baseline hemoglobin is about 11-12. Hemoglobin of 6.0 on admission requiring transfusion of 2 units of PRBC. Post-transfusion hemoglobin of 7.9 with downward drift to 7.4. now hemoglobin up to 8.5-8.7. Secondary to GI bleed. -CBC tomorrow morning -Transfuse for hemoglobin <8  Psoriatic arthritis Rheumatoid arthritis -Continue home leflunomide  OSA -Continue CPAP  Hypothyroidism -Continue thyroid 90 mg daily  Hyperlipidemia -Continue pravastatin  Esophageal stenosis Benign appearing. Dilated by GI on 12/30.  CAD History of PCI with DES in 2019. Patient is on aspirin and Xarelto as an outpatient. Aspirin and Xarelto held. GI recommending to restart Xarelto. -Resume home Xarelto  Atrial fibrillation, unspecified Patient is on diltiazem, nebivolol and Xarelto as an  outpatient -Continue diltiazem and nebivolol  Primary hypertension Patient is on diltiazem, nebivolol, hydrochlorothiazide and telmisartan as an outpatient. -Continue diltiazem, nebivolol and telmisartan (irbesartan while inpatient)  CKD stage IIIb Recent baseline creatinine is about 1.3. Creatinine of 1.22 on admission and down to 1.09.  Complete heart block S/p PPM  ILD Noted. Patient follows with pulmonology -Continue Pirfenidone  BPH Noted. History of TURP.  Diabetes mellitus, type 2 Slightly uncontrolled with hyperglycemia for age with hemoglobin A1C of 8.1%. patient is on Jardiance and glimepiride for management as an outpatient. Started on SSI inpatient. -Continue SSI    DVT prophylaxis: SCDs Code Status:   Code Status: Full Code Family Communication: Wife at bedside Disposition Plan: Discharge home likely in 1 days if bleeding remains stopped, hemoglobin stabilizes and continued GI recommendations   Consultants:  [facility]  Procedures:  12/30: Upper endoscopy  Antimicrobials: None    Subjective: No bowel movement overnight. Black stools during prep. Some weakness but otherwise no concerns.  Objective: BP (!) 155/58 (BP Location: Left Arm)   Pulse (!) 59   Temp (!) 97.4 F (36.3 C) (Oral)   Resp 16   Ht _0  (1.702 m)   Wt 78 kg   SpO2 100%   BMI 26.94 kg/m   Examination:  General exam: Appears calm and comfortable Respiratory system: Respiratory effort normal. Central nervous system: Alert and oriented. No focal neurological deficits. Psychiatry: Judgement and insight appear normal. Mood & affect appropriate.    Data Reviewed: I have personally reviewed following labs and imaging studies  CBC Lab Results  Component Value Date   WBC 7.1 05/25/2022   RBC 2.78 (L) 05/25/2022   HGB 8.7 (L) 05/25/2022   HCT 26.4 (L) 05/25/2022   MCV 95.0 05/25/2022   MCH 31.3 05/25/2022   PLT 185 05/25/2022   MCHC 33.0  05/25/2022   RDW  19.5 (H) 05/25/2022   LYMPHSABS 2.6 07/25/2020   MONOABS 0.7 11/10/2018   EOSABS 0.3 07/25/2020   BASOSABS 0.1 50/38/8828     Last metabolic panel Lab Results  Component Value Date   NA 138 05/25/2022   K 4.4 05/25/2022   CL 109 05/25/2022   CO2 25 05/25/2022   BUN 24 (H) 05/25/2022   CREATININE 1.04 05/25/2022   GLUCOSE 101 (H) 05/25/2022   GFRNONAA >60 05/25/2022   GFRAA >60 08/02/2019   CALCIUM 9.0 05/25/2022   PHOS 3.0 12/09/2011   PROT 5.0 (L) 05/24/2022   ALBUMIN 2.6 (L) 05/24/2022   BILITOT 0.7 05/24/2022   ALKPHOS 45 05/24/2022   AST 15 05/24/2022   ALT 11 05/24/2022   ANIONGAP 4 (L) 05/25/2022    GFR: Estimated Creatinine Clearance: 51.2 mL/min (by C-G formula based on SCr of 1.04 mg/dL).  Recent Results (from the past 240 hour(s))  Resp panel by RT-PCR (RSV, Flu A&B, Covid) Anterior Nasal Swab     Status: None   Collection Time: 05/23/22  2:46 PM   Specimen: Anterior Nasal Swab  Result Value Ref Range Status   SARS Coronavirus 2 by RT PCR NEGATIVE NEGATIVE Final    Comment: (NOTE) SARS-CoV-2 target nucleic acids are NOT DETECTED.  The SARS-CoV-2 RNA is generally detectable in upper respiratory specimens during the acute phase of infection. The lowest concentration of SARS-CoV-2 viral copies this assay can detect is 138 copies/mL. A negative result does not preclude SARS-Cov-2 infection and should not be used as the sole basis for treatment or other patient management decisions. A negative result may occur with  improper specimen collection/handling, submission of specimen other than nasopharyngeal swab, presence of viral mutation(s) within the areas targeted by this assay, and inadequate number of viral copies(<138 copies/mL). A negative result must be combined with clinical observations, patient history, and epidemiological information. The expected result is Negative.  Fact Sheet for Patients:  EntrepreneurPulse.com.au  Fact  Sheet for Healthcare Providers:  IncredibleEmployment.be  This test is no t yet approved or cleared by the [place] FDA and  has been authorized for detection and/or diagnosis of SARS-CoV-2 by FDA under an Emergency Use Authorization (EUA). This EUA will remain  in effect (meaning this test can be used) for the duration of the COVID-19 declaration under Section 564(b)(1) of the Act, 21 U.S.C.section 360bbb-3(b)(1), unless the authorization is terminated  or revoked sooner.       Influenza A by PCR NEGATIVE NEGATIVE Final   Influenza B by PCR NEGATIVE NEGATIVE Final    Comment: (NOTE) The Xpert Xpress SARS-CoV-2/FLU/RSV plus assay is intended as an aid in the diagnosis of influenza from Nasopharyngeal swab specimens and should not be used as a sole basis for treatment. Nasal washings and aspirates are unacceptable for Xpert Xpress SARS-CoV-2/FLU/RSV testing.  Fact Sheet for Patients: EntrepreneurPulse.com.au  Fact Sheet for Healthcare Providers: IncredibleEmployment.be  This test is not yet approved or cleared by the [place] FDA and has been authorized for detection and/or diagnosis of SARS-CoV-2 by FDA under an Emergency Use Authorization (EUA). This EUA will remain in effect (meaning this test can be used) for the duration of the COVID-19 declaration under Section 564(b)(1) of the Act, 21 U.S.C. section 360bbb-3(b)(1), unless the authorization is terminated or revoked.     Resp Syncytial Virus by PCR NEGATIVE NEGATIVE Final    Comment: (NOTE) Fact Sheet for Patients: EntrepreneurPulse.com.au  Fact Sheet for Healthcare Providers: IncredibleEmployment.be  This test is not yet approved or cleared by the [place] and has been authorized for detection and/or diagnosis of SARS-CoV-2 by FDA under an Emergency Use Authorization (EUA). This EUA will remain in effect  (meaning this test can be used) for the duration of the COVID-19 declaration under Section 564(b)(1) of the Act, 21 U.S.C. section 360bbb-3(b)(1), unless the authorization is terminated or revoked.  Performed at [facility], [place] [address] [place], [place] [address]       Radiology Studies: No results found.    LOS: 1 day    Cordelia Poche, MD [facility] 05/25/2022, 10:55 AM   If 7PM-7AM, please contact night-coverage [email or web address]

## 2022-05-25 NOTE — Anesthesia Procedure Notes (Signed)
Procedure Name: MAC Date/Time: 05/25/2022 8:50 AM  Performed by: Lowella Dell, CRNAPre-anesthesia Checklist: Patient identified, Emergency Drugs available, Suction available, Patient being monitored and Timeout performed Patient Re-evaluated:Patient Re-evaluated prior to induction Oxygen Delivery Method: Nasal cannula Placement Confirmation: positive ETCO2 Dental Injury: Teeth and Oropharynx as per pre-operative assessment

## 2022-05-25 NOTE — Anesthesia Preprocedure Evaluation (Addendum)
Anesthesia Evaluation  Patient identified by MRN, date of birth, ID band Patient awake    Reviewed: Allergy & Precautions, NPO status , Patient's Chart, lab work & pertinent test results  Airway Mallampati: II  TM Distance: >3 FB Neck ROM: Full    Dental no notable dental hx.    Pulmonary sleep apnea , former smoker   Pulmonary exam normal        Cardiovascular hypertension, + angina  + CAD and +CHF  + dysrhythmias Atrial Fibrillation + pacemaker  Rhythm:Irregular Rate:Normal     Neuro/Psych negative neurological ROS  negative psych ROS   GI/Hepatic negative GI ROS, Neg liver ROS,,,  Endo/Other  diabetes, Type 2Hypothyroidism    Renal/GU   negative genitourinary   Musculoskeletal  (+) Arthritis , Rheumatoid disorders,    Abdominal Normal abdominal exam  (+)   Peds  Hematology  (+) Blood dyscrasia, anemia   Anesthesia Other Findings   Reproductive/Obstetrics                             Anesthesia Physical Anesthesia Plan  ASA: 3  Anesthesia Plan: MAC   Post-op Pain Management:    Induction: Intravenous  PONV Risk Score and Plan: 1 and Propofol infusion and Treatment may vary due to age or medical condition  Airway Management Planned: Simple Face Mask, Natural Airway and Nasal Cannula  Additional Equipment: None  Intra-op Plan:   Post-operative Plan:   Informed Consent: I have reviewed the patients History and Physical, chart, labs and discussed the procedure including the risks, benefits and alternatives for the proposed anesthesia with the patient or authorized representative who has indicated his/her understanding and acceptance.     Dental advisory given  Plan Discussed with: CRNA  Anesthesia Plan Comments:        Anesthesia Quick Evaluation

## 2022-05-25 NOTE — Op Note (Addendum)
[facility] Patient [name] Procedure Date : 05/25/2022 MRN: [id number] Attending MD: Georgian Co , , [id number] Date of Birth: 06-17-1940 CSN: [id number] Age: 81 Admit Type: Inpatient Procedure:                Colonoscopy Indications:              Melena, Iron deficiency anemia Providers:                Adline Mango" Alva Garnet                            Technician, Technician Referring MD:             Hospitalist team Medicines:                Monitored Anesthesia Care Complications:            No immediate complications. Estimated Blood Loss:     Estimated blood loss: none. Procedure:                Pre-Anesthesia Assessment:                           - Prior to the procedure, a History and Physical                            was performed, and patient medications and                            allergies were reviewed. The patient's tolerance of                            previous anesthesia was also reviewed. The risks                            and benefits of the procedure and the sedation                            options and risks were discussed with the patient.                            All questions were answered, and informed consent                            was obtained. Prior Anticoagulants: The patient has                            taken Xarelto (rivaroxaban), last dose was 3 days                            prior to procedure. After reviewing the risks and                            benefits, the patient was deemed in satisfactory  condition to undergo the procedure.                           After obtaining informed consent, the colonoscope                            was passed under direct vision. Throughout the                            procedure, the patient's blood pressure, pulse, and                            oxygen saturations were monitored continuously. The                             CF-[id number] ([id number]) Olympus colonoscope was                            introduced through the anus and advanced to the the                            terminal ileum. The colonoscopy was performed                            without difficulty. The patient tolerated the                            procedure well. The quality of the bowel                            preparation was fair. The terminal ileum, ileocecal                            valve, appendiceal orifice, and rectum were                            photographed. Scope In: 8:53:21 AM Scope Out: 9:15:45 AM Scope Withdrawal Time: 0 hours 14 minutes 26 seconds  Total Procedure Duration: 0 hours 22 minutes 24 seconds  Findings:      The terminal ileum appeared normal.      A few diverticula were found in the sigmoid colon.      Non-bleeding internal hemorrhoids were found during retroflexion. Impression:               - Preparation of the colon was fair.                           - The examined portion of the ileum was normal.                           - Diverticulosis in the sigmoid colon.                           - Non-bleeding internal hemorrhoids.                           -  No specimens collected. Recommendation:           - Return patient to hospital ward for ongoing care.                           - Patient does not appear to have any active signs                            of bleeding. Perhaps there was a bleeding                            Dieulafoy's lesion or small bowel AVM that has now                            resolved.                           - Will plan for VCE today. Patient does not have to                            stay in the hospital to wait for the VCE to be read                           - Okay to restart Xarelto today.                           - The findings and recommendations were discussed                            with the patient. Procedure Code(s):        --- Professional  ---                           [phone], Colonoscopy, flexible; diagnostic, including                            collection of specimen(s) by brushing or washing,                            when performed (separate procedure) Diagnosis Code(s):        --- Professional ---                           K64.8, Other hemorrhoids                           K92.1, Melena (includes Hematochezia)                           D50.9, Iron deficiency anemia, unspecified                           K57.30, Diverticulosis of large intestine without                            perforation or  abscess without bleeding CPT copyright 2022 American [employer] Association. All rights reserved. The codes documented in this report are preliminary and upon coder review may  be revised to meet current compliance requirements. Dr Georgian Co "Lyndee Leo" Lorenso Courier,  05/25/2022 9:34:45 AM Number of Addenda: 0

## 2022-05-25 NOTE — Interval H&P Note (Signed)
History and Physical Interval Note:  05/25/2022 8:30 AM  Cameron [name]  has presented today for surgery, with the diagnosis of Melena, anemia.  The various methods of treatment have been discussed with the patient and family. After consideration of risks, benefits and other options for treatment, the patient has consented to  Procedure(s): COLONOSCOPY WITH PROPOFOL (N/A) as a surgical intervention.  The patient's history has been reviewed, patient examined, no change in status, stable for surgery.  I have reviewed the patient's chart and labs.  Questions were answered to the patient's satisfaction.     Sharyn Creamer

## 2022-05-25 NOTE — Transfer of Care (Signed)
Immediate Anesthesia Transfer of Care Note  Patient: Cameron [name]  Procedure(s) Performed: COLONOSCOPY WITH PROPOFOL  Patient Location: PACU  Anesthesia Type:MAC  Level of Consciousness: awake and patient cooperative  Airway & Oxygen Therapy: Patient Spontanous Breathing and Patient connected to nasal cannula oxygen  Post-op Assessment: Report given to RN and Post -op Vital signs reviewed and stable  Post vital signs: Reviewed and stable  Last Vitals:  Vitals Value Taken Time  BP 126/47 05/25/22 0925  Temp    Pulse 60 05/25/22 0927  Resp 20 05/25/22 0927  SpO2 100 % 05/25/22 0927  Vitals shown include unvalidated device data.  Last Pain:  Vitals:   05/25/22 0820  TempSrc: Temporal  PainSc: 0-No pain         Complications: No notable events documented.

## 2022-05-25 NOTE — Progress Notes (Signed)
Patient declined the use of CPAP for the night

## 2022-05-25 NOTE — Progress Notes (Signed)
Pt ingested pill cam @ 1000 with no difficulty. Instructions reviewed with patient and bedside RN.   Spoke with bedside RN about the possibility of pt discharge tomorrow. If pt is discharged 1/1 6N charge RN to keep capsule until endo staff  can pick up 1/1 or 1/2.

## 2022-05-26 DIAGNOSIS — I4891 Unspecified atrial fibrillation: Secondary | ICD-10-CM | POA: Diagnosis not present

## 2022-05-26 DIAGNOSIS — I442 Atrioventricular block, complete: Secondary | ICD-10-CM | POA: Diagnosis not present

## 2022-05-26 DIAGNOSIS — N401 Enlarged prostate with lower urinary tract symptoms: Secondary | ICD-10-CM | POA: Diagnosis not present

## 2022-05-26 DIAGNOSIS — K922 Gastrointestinal hemorrhage, unspecified: Secondary | ICD-10-CM | POA: Diagnosis not present

## 2022-05-26 LAB — CBC
HCT: 21.5 % — ABNORMAL LOW (ref 39.0–52.0)
Hemoglobin: 7.1 g/dL — ABNORMAL LOW (ref 13.0–17.0)
MCH: 31.4 pg (ref 26.0–34.0)
MCHC: 33 g/dL (ref 30.0–36.0)
MCV: 95.1 fL (ref 80.0–100.0)
Platelets: 167 10*3/uL (ref 150–400)
RBC: 2.26 MIL/uL — ABNORMAL LOW (ref 4.22–5.81)
RDW: 18.9 % — ABNORMAL HIGH (ref 11.5–15.5)
WBC: 8.1 10*3/uL (ref 4.0–10.5)
nRBC: 0 % (ref 0.0–0.2)

## 2022-05-26 LAB — RESPIRATORY PANEL BY PCR

## 2022-05-26 LAB — RESP PANEL BY RT-PCR (RSV, FLU A&B, COVID)  RVPGX2
Influenza A by PCR: NEGATIVE
Influenza B by PCR: NEGATIVE
Resp Syncytial Virus by PCR: NEGATIVE
SARS Coronavirus 2 by RT PCR: NEGATIVE

## 2022-05-26 LAB — GLUCOSE, CAPILLARY
Glucose-Capillary: 186 mg/dL — ABNORMAL HIGH (ref 70–99)
Glucose-Capillary: 133 mg/dL — ABNORMAL HIGH (ref 70–99)
Glucose-Capillary: 134 mg/dL — ABNORMAL HIGH (ref 70–99)
Glucose-Capillary: 145 mg/dL — ABNORMAL HIGH (ref 70–99)

## 2022-05-26 LAB — HEMOGLOBIN AND HEMATOCRIT, BLOOD
HCT: 22.3 % — ABNORMAL LOW (ref 39.0–52.0)
HCT: 25.2 % — ABNORMAL LOW (ref 39.0–52.0)
Hemoglobin: 7.1 g/dL — ABNORMAL LOW (ref 13.0–17.0)
Hemoglobin: 8.1 g/dL — ABNORMAL LOW (ref 13.0–17.0)

## 2022-05-26 LAB — PROCALCITONIN: Procalcitonin: <0.1 ng/mL

## 2022-05-26 LAB — PREPARE RBC (CROSSMATCH)

## 2022-05-26 MED ORDER — LACTATED RINGERS IV BOLUS
500.0000 mL | Freq: Once | INTRAVENOUS | Status: AC
  Administered 2022-05-26: 500 mL via INTRAVENOUS

## 2022-05-26 MED ORDER — SODIUM CHLORIDE 0.9% IV SOLUTION: Freq: Once | INTRAVENOUS | Status: AC

## 2022-05-26 MED ORDER — LACTATED RINGERS IV SOLN: INTRAVENOUS | Status: DC

## 2022-05-26 NOTE — Progress Notes (Signed)
Pt receive blood at 1218. At 1400, pt call out complain of chills and freezing. This nurse notified charge RN. Vitals sign taking, saline given, blood held until I received further Instruction from the physician.  Pt receive acetaminophen. Will floow up with patient and wife to see how the patient is feeling.  Notified Dr. Teryl Lucy via secure chat and text page. He wants to stop the infusion for now. MD place new orders. Charge RN notified blood bank at 1401.

## 2022-05-26 NOTE — Progress Notes (Signed)
   05/26/22 1615  Assess: MEWS Score  Temp (!) 101.6 F (38.7 C)  BP (!) 101/48  MAP (mmHg) (!) 63  Pulse Rate 61  Resp 17  SpO2 99 %  O2 Device Room Air  Assess: MEWS Score  MEWS Temp 2  MEWS Systolic 0  MEWS Pulse 0  MEWS RR 0  MEWS LOC 0  MEWS Score 2  MEWS Score Color Yellow  Assess: if the MEWS score is Yellow or Red  Were vital signs taken at a resting state? Yes  Focused Assessment Change from prior assessment (see assessment flowsheet)  Does the patient meet 2 or more of the SIRS criteria? No  MEWS guidelines implemented *See Row Information* No, previously yellow, continue vital signs every 4 hours  Treat  MEWS Interventions Other (Comment) (MD notified,)  Pain Scale 0-10  Pain Score 0  Notify: Charge Nurse/RN  Name of Charge Nurse/RN Notified Zee, RN  Date Charge Nurse/RN Notified 05/26/22  Time Charge Nurse/RN Notified 1635  Provider Notification  Provider Name/Title Dr. Gardenia Phlegm  Date Provider Notified 05/26/22  Time Provider Notified 1635  Method of Notification Page  Notification Reason Change in status  Provider response See new orders  Date of Provider Response 05/26/22  Time of Provider Response 1636  Assess: SIRS CRITERIA  SIRS Temperature  1  SIRS Pulse 0  SIRS Respirations  0  SIRS WBC 0  SIRS Score Sum  1   See new orders.

## 2022-05-26 NOTE — Progress Notes (Addendum)
PROGRESS NOTE    Cameron [name]  VOP:[id number] DOB: 04-Mar-1941 DOA: 05/23/2022 PCP: [name], MD   Brief Narrative: Cameron [name] is a 82 y.o. male with a history of psoriatic arthritis, rheumatoid arthritis, OSA, hypothyroidism, hyperlipidemia, CAD, atrial fibrillation, CKD stage IIIb, complete heart block s/p PPM, ILD, hypertension, BPH s/p TURP and diabetes. Patient presented secondary to weakness and lightheadedness with a history of black/dark stools and found to have focal occult positive stool and anemia requiring blood transfusion. [facility] consulted and performed upper endoscopy, without identification of bleeding source.   Assessment and Plan:  Melena Black/dark stools. Fecal occult test positive in the ED. Associated anemia. [facility] consulted and performed upper endoscopy on 12/30 which was significant for benign appearing esophageal stenosis, normal stomach and acquired duodenal stenosis. Colonoscopy performed on 12/31 was significant for diverticulosis and non-bleeding internal hemorrhoids. -GI recommendations: video capsule placed on 12/31  Acute blood loss anemia Baseline hemoglobin is about 11-12. Hemoglobin of 6.0 on admission requiring transfusion of 2 units of PRBC. Post-transfusion hemoglobin of 7.9 with downward drift to 7.4. then rebound hemoglobin up to 8.5-8.7. Secondary to GI bleed. Now patient with decreased hemoglobin of 7.1 today -CBC tomorrow morning -Transfuse 1 unit of PRBC today -Transfuse for hemoglobin <8  Psoriatic arthritis Rheumatoid arthritis -Continue home leflunomide  OSA -Continue CPAP  Hypothyroidism -Continue thyroid 90 mg daily  Hyperlipidemia -Continue pravastatin  Esophageal stenosis Benign appearing. Dilated by GI on 12/30.  CAD History of PCI with DES in 2019. Patient is on aspirin and Xarelto as an outpatient. Aspirin and Xarelto held. GI recommending to restart Xarelto on 12/31. Now with recurrent acute  anemia -Discontinue Xarelto  Atrial fibrillation, unspecified Patient is on diltiazem, nebivolol and Xarelto as an outpatient -Continue diltiazem and nebivolol  Fever Temperature up to 101.6 F last night. No symptoms although patient had chills. -Blood cultures/procalcitonin  Addendum: patient with negative procalcitonin. Check RVP, COVID, Flu. BP soft in setting of antihypertensives. Will start LR 500 mL bolus followed by LR 75 mL/hr; increase rate as needed  Primary hypertension Patient is on diltiazem, nebivolol, hydrochlorothiazide and telmisartan as an outpatient. -Continue diltiazem, nebivolol and telmisartan (irbesartan while inpatient)  CKD stage IIIb Recent baseline creatinine is about 1.3. Creatinine of 1.22 on admission and down to 1.04.  Complete heart block S/p PPM  ILD Noted. Patient follows with pulmonology -Continue Pirfenidone  BPH Noted. History of TURP.  Diabetes mellitus, type 2 Slightly uncontrolled with hyperglycemia for age with hemoglobin A1C of 8.1%. patient is on Jardiance and glimepiride for management as an outpatient. Started on SSI inpatient. -Continue SSI    DVT prophylaxis: SCDs Code Status:   Code Status: Full Code Family Communication: Wife at bedside Disposition Plan: Discharge home likely in 1-2 days if bleeding remains stopped, hemoglobin stabilizes and continued GI recommendations   Consultants:  [facility]  Procedures:  12/30: Upper endoscopy 12/31: Colonoscopy; Video capsule endoscopy  Antimicrobials: None    Subjective: Still without a bowel movement. No melena or hematochezia noted.  Objective: BP (!) 106/40 (BP Location: Left Arm)   Pulse 65   Temp 99.8 F (37.7 C) (Oral)   Resp 17   Ht _0  (1.702 m)   Wt 78 kg   SpO2 95%   BMI 26.94 kg/m   Examination:  General exam: Appears calm and comfortable Respiratory system: Clear to auscultation. Respiratory effort normal. Cardiovascular system:  S1 & S2 heard, RRR. Gastrointestinal system: Abdomen is nondistended,  soft and nontender. Normal bowel sounds heard. Central nervous system: Alert and oriented. No focal neurological deficits. Musculoskeletal: No calf tenderness Skin: No cyanosis. No rashes Psychiatry: Judgement and insight appear normal. Mood & affect appropriate.    Data Reviewed: I have personally reviewed following labs and imaging studies  CBC Lab Results  Component Value Date   WBC 8.1 05/26/2022   RBC 2.26 (L) 05/26/2022   HGB 7.1 (L) 05/26/2022   HCT 22.3 (L) 05/26/2022   MCV 95.1 05/26/2022   MCH 31.4 05/26/2022   PLT 167 05/26/2022   MCHC 33.0 05/26/2022   RDW 18.9 (H) 05/26/2022   LYMPHSABS 2.6 07/25/2020   MONOABS 0.7 11/10/2018   EOSABS 0.3 07/25/2020   BASOSABS 0.1 78/29/5621     Last metabolic panel Lab Results  Component Value Date   NA 138 05/25/2022   K 4.4 05/25/2022   CL 109 05/25/2022   CO2 25 05/25/2022   BUN 24 (H) 05/25/2022   CREATININE 1.04 05/25/2022   GLUCOSE 101 (H) 05/25/2022   GFRNONAA >60 05/25/2022   GFRAA >60 08/02/2019   CALCIUM 9.0 05/25/2022   PHOS 3.0 12/09/2011   PROT 5.0 (L) 05/24/2022   ALBUMIN 2.6 (L) 05/24/2022   BILITOT 0.7 05/24/2022   ALKPHOS 45 05/24/2022   AST 15 05/24/2022   ALT 11 05/24/2022   ANIONGAP 4 (L) 05/25/2022    GFR: Estimated Creatinine Clearance: 51.2 mL/min (by C-G formula based on SCr of 1.04 mg/dL).  Recent Results (from the past 240 hour(s))  Resp panel by RT-PCR (RSV, Flu A&B, Covid) Anterior Nasal Swab     Status: None   Collection Time: 05/23/22  2:46 PM   Specimen: Anterior Nasal Swab  Result Value Ref Range Status   SARS Coronavirus 2 by RT PCR NEGATIVE NEGATIVE Final    Comment: (NOTE) SARS-CoV-2 target nucleic acids are NOT DETECTED.  The SARS-CoV-2 RNA is generally detectable in upper respiratory specimens during the acute phase of infection. The lowest concentration of SARS-CoV-2 viral copies this assay can  detect is 138 copies/mL. A negative result does not preclude SARS-Cov-2 infection and should not be used as the sole basis for treatment or other patient management decisions. A negative result may occur with  improper specimen collection/handling, submission of specimen other than nasopharyngeal swab, presence of viral mutation(s) within the areas targeted by this assay, and inadequate number of viral copies(<138 copies/mL). A negative result must be combined with clinical observations, patient history, and epidemiological information. The expected result is Negative.  Fact Sheet for Patients:  EntrepreneurPulse.com.au  Fact Sheet for Healthcare Providers:  IncredibleEmployment.be  This test is no t yet approved or cleared by the [place] FDA and  has been authorized for detection and/or diagnosis of SARS-CoV-2 by FDA under an Emergency Use Authorization (EUA). This EUA will remain  in effect (meaning this test can be used) for the duration of the COVID-19 declaration under Section 564(b)(1) of the Act, 21 U.S.C.section 360bbb-3(b)(1), unless the authorization is terminated  or revoked sooner.       Influenza A by PCR NEGATIVE NEGATIVE Final   Influenza B by PCR NEGATIVE NEGATIVE Final    Comment: (NOTE) The Xpert Xpress SARS-CoV-2/FLU/RSV plus assay is intended as an aid in the diagnosis of influenza from Nasopharyngeal swab specimens and should not be used as a sole basis for treatment. Nasal washings and aspirates are unacceptable for Xpert Xpress SARS-CoV-2/FLU/RSV testing.  Fact Sheet for Patients: EntrepreneurPulse.com.au  Fact Sheet for Healthcare Providers:  IncredibleEmployment.be  This test is not yet approved or cleared by the [place] and has been authorized for detection and/or diagnosis of SARS-CoV-2 by FDA under an Emergency Use Authorization (EUA). This EUA will remain in  effect (meaning this test can be used) for the duration of the COVID-19 declaration under Section 564(b)(1) of the Act, 21 U.S.C. section 360bbb-3(b)(1), unless the authorization is terminated or revoked.     Resp Syncytial Virus by PCR NEGATIVE NEGATIVE Final    Comment: (NOTE) Fact Sheet for Patients: EntrepreneurPulse.com.au  Fact Sheet for Healthcare Providers: IncredibleEmployment.be  This test is not yet approved or cleared by the [place] FDA and has been authorized for detection and/or diagnosis of SARS-CoV-2 by FDA under an Emergency Use Authorization (EUA). This EUA will remain in effect (meaning this test can be used) for the duration of the COVID-19 declaration under Section 564(b)(1) of the Act, 21 U.S.C. section 360bbb-3(b)(1), unless the authorization is terminated or revoked.  Performed at [facility], [place] [address], [place], [place] [address]       Radiology Studies: No results found.    LOS: 2 days    Cordelia Poche, MD [facility] 05/26/2022, 9:23 AM   If 7PM-7AM, please contact night-coverage [email or web address]

## 2022-05-26 NOTE — Progress Notes (Signed)
    Gastroenterology Inpatient Follow Up    Subjective: Has not had a BM. Denies ab pain.  Objective: Vital signs in last 24 hours: Temp:  [97.8 F (36.6 C)-101.6 F (38.7 C)] 98 F (36.7 C) (01/01 1347) Pulse Rate:  [60-84] 84 (01/01 1347) Resp:  [14-20] 16 (01/01 1233) BP: (97-162)/(40-51) 162/48 (01/01 1347) SpO2:  [92 %-100 %] 100 % (01/01 1233) Last BM Date : 05/25/22  Intake/Output from previous day: 12/31 0701 - 01/01 0700 In: 200 [I.V.:200] Out: -  Intake/Output this shift: Total I/O In: 670 [P.O.:460; Blood:210] Out: -   General appearance: alert and cooperative Resp: no increased WOB Cardio: regular rate GI: non-tender, non-distended Extremities: No BLE edema  Lab Results: Recent Labs    05/24/22 1809 05/25/22 0703 05/26/22 0322 05/26/22 0834  WBC 8.2 7.1 8.1  --   HGB 8.5* 8.7* 7.1* 7.1*  HCT 25.6* 26.4* 21.5* 22.3*  PLT 179 185 167  --    BMET Recent Labs    05/24/22 0631 05/25/22 0703  NA 140 138  K 4.0 4.4  CL 108 109  CO2 23 25  GLUCOSE 92 101*  BUN 33* 24*  CREATININE 1.09 1.04  CALCIUM 8.7* 9.0   LFT Recent Labs    05/24/22 0631  PROT 5.0*  ALBUMIN 2.6*  AST 15  ALT 11  ALKPHOS 45  BILITOT 0.7   PT/INR No results for input(s): "LABPROT", "INR" in the last 72 hours. Hepatitis Panel No results for input(s): "HEPBSAG", "HCVAB", "HEPAIGM", "HEPBIGM" in the last 72 hours. C-Diff No results for input(s): "CDIFFTOX" in the last 72 hours.  Studies/Results: No results found.  Medications: I have reviewed the patient's current medications. Scheduled:  sodium chloride   Intravenous Once   diltiazem  120 mg Oral Daily   gabapentin  300 mg Oral QHS   insulin aspart  0-9 Units Subcutaneous TID WC   irbesartan  300 mg Oral Daily   leflunomide  10 mg Oral Daily   nebivolol  10 mg Oral Daily   pantoprazole (PROTONIX) IV  40 mg Intravenous Q12H   Pirfenidone  267 mg Oral TID WC   pravastatin  20 mg Oral QPM   sodium chloride  flush  3 mL Intravenous Q12H   thyroid  90 mg Oral BH-q7a   Continuous: ZOX:[id number] **OR** acetaminophen  Assessment/Plan: 82 year old male with history of A-fib on Xarelto, heart block s/p PM, ACD s/p PCI, OSA on CPAP, COPD, hypothyroidism, psoriatic arthritis, DM presents with melena. EGD 12/30 showed esophageal stenosis that was dilated to 18 mm and duodenal stenosis. Colonoscopy 12/31 showed diverticulosis and internal hemorrhoids. VCE was swallowed on 12/31 but is not able to be uploaded today due to limited staffing. Patient's Hb dropped today from 8.7 to 7.1 but patient denies any signs of GI bleeding.  - Trend Hb - Follow up results of VCE on 1/2    LOS: 2 days   Sharyn Creamer 05/26/2022, 3:15 PM

## 2022-05-26 NOTE — Progress Notes (Signed)
Called to patient's room by his primary nurse , patient  having chills while having is blood transfusion. Oral temperature showing 98 but patiet is very warm to touch and with flushed face. BT stopped, MD made aware. Rectal temperature taken 100.8. Tylenol given. Blood bank made aware of the reaction.

## 2022-05-27 ENCOUNTER — Encounter (HOSPITAL_COMMUNITY): Payer: Self-pay | Admitting: Internal Medicine

## 2022-05-27 DIAGNOSIS — N401 Enlarged prostate with lower urinary tract symptoms: Secondary | ICD-10-CM | POA: Diagnosis not present

## 2022-05-27 DIAGNOSIS — D500 Iron deficiency anemia secondary to blood loss (chronic): Secondary | ICD-10-CM | POA: Diagnosis not present

## 2022-05-27 DIAGNOSIS — I4891 Unspecified atrial fibrillation: Secondary | ICD-10-CM | POA: Diagnosis not present

## 2022-05-27 DIAGNOSIS — I442 Atrioventricular block, complete: Secondary | ICD-10-CM | POA: Diagnosis not present

## 2022-05-27 DIAGNOSIS — K922 Gastrointestinal hemorrhage, unspecified: Secondary | ICD-10-CM | POA: Diagnosis not present

## 2022-05-27 LAB — URINALYSIS, COMPLETE (UACMP) WITH MICROSCOPIC
Bilirubin Urine: NEGATIVE
Glucose, UA: NEGATIVE mg/dL
Hgb urine dipstick: NEGATIVE
Ketones, ur: NEGATIVE mg/dL
Leukocytes,Ua: NEGATIVE
Nitrite: NEGATIVE
Protein, ur: NEGATIVE mg/dL
Specific Gravity, Urine: 1.02 (ref 1.005–1.030)
pH: 5.5 (ref 5.0–8.0)

## 2022-05-27 LAB — BPAM RBC
Blood Product Expiration Date: 202401072359
Blood Product Expiration Date: 202401222359
Blood Product Expiration Date: 202401232359
ISSUE DATE / TIME: 202312291439
ISSUE DATE / TIME: 202312291744
ISSUE DATE / TIME: 202401011210
Unit Type and Rh: 6200
Unit Type and Rh: 6200
Unit Type and Rh: 6200

## 2022-05-27 LAB — TYPE AND SCREEN
ABO/RH(D): A POS
Antibody Screen: NEGATIVE
Unit division: 0
Unit division: 0
Unit division: 0

## 2022-05-27 LAB — CBC
HCT: 22.9 % — ABNORMAL LOW (ref 39.0–52.0)
Hemoglobin: 7.3 g/dL — ABNORMAL LOW (ref 13.0–17.0)
MCH: 30.5 pg (ref 26.0–34.0)
MCHC: 31.9 g/dL (ref 30.0–36.0)
MCV: 95.8 fL (ref 80.0–100.0)
Platelets: 165 10*3/uL (ref 150–400)
RBC: 2.39 MIL/uL — ABNORMAL LOW (ref 4.22–5.81)
RDW: 18.5 % — ABNORMAL HIGH (ref 11.5–15.5)
WBC: 5.4 10*3/uL (ref 4.0–10.5)
nRBC: 0 % (ref 0.0–0.2)

## 2022-05-27 LAB — GLUCOSE, CAPILLARY
Glucose-Capillary: 117 mg/dL — ABNORMAL HIGH (ref 70–99)
Glucose-Capillary: 179 mg/dL — ABNORMAL HIGH (ref 70–99)
Glucose-Capillary: 119 mg/dL — ABNORMAL HIGH (ref 70–99)
Glucose-Capillary: 128 mg/dL — ABNORMAL HIGH (ref 70–99)
Glucose-Capillary: 158 mg/dL — ABNORMAL HIGH (ref 70–99)

## 2022-05-27 LAB — HEMOGLOBIN AND HEMATOCRIT, BLOOD
HCT: 26.6 % — ABNORMAL LOW (ref 39.0–52.0)
Hemoglobin: 8.4 g/dL — ABNORMAL LOW (ref 13.0–17.0)

## 2022-05-27 LAB — PROCALCITONIN: Procalcitonin: 1.37 ng/mL

## 2022-05-27 MED ORDER — POLYETHYLENE GLYCOL 3350 17 G PO PACK
17.0000 g | PACK | Freq: Once | ORAL | Status: AC
  Administered 2022-05-27: 17 g via ORAL
  Filled 2022-05-27: qty 1

## 2022-05-27 MED ORDER — VANCOMYCIN HCL 1250 MG/250ML IV SOLN
1250.0000 mg | INTRAVENOUS | Status: DC
  Filled 2022-05-27: qty 250

## 2022-05-27 MED ORDER — METRONIDAZOLE 500 MG/100ML IV SOLN
500.0000 mg | Freq: Two times a day (BID) | INTRAVENOUS | Status: DC
  Administered 2022-05-27 – 2022-05-28 (×3): 500 mg via INTRAVENOUS
  Filled 2022-05-27 (×3): qty 100

## 2022-05-27 MED ORDER — SODIUM CHLORIDE 0.9 % IV SOLN
2.0000 g | Freq: Two times a day (BID) | INTRAVENOUS | Status: DC
  Administered 2022-05-27 – 2022-05-28 (×3): 2 g via INTRAVENOUS
  Filled 2022-05-27 (×3): qty 12.5

## 2022-05-27 MED ORDER — VANCOMYCIN HCL 1500 MG/300ML IV SOLN
1500.0000 mg | Freq: Once | INTRAVENOUS | Status: AC
  Administered 2022-05-27: 1500 mg via INTRAVENOUS
  Filled 2022-05-27: qty 300

## 2022-05-27 MED ORDER — PANTOPRAZOLE SODIUM 40 MG PO TBEC
40.0000 mg | DELAYED_RELEASE_TABLET | Freq: Every day | ORAL | Status: DC
  Administered 2022-05-27 – 2022-05-30 (×4): 40 mg via ORAL
  Filled 2022-05-27 (×4): qty 1

## 2022-05-27 NOTE — Progress Notes (Signed)
PROGRESS NOTE    ADONUS USELMAN  ONG:[id number] DOB: Oct 12, 1940 DOA: 05/23/2022 PCP: [name], MD   Brief Narrative: Cameron [name] is a 82 y.o. male with a history of psoriatic arthritis, rheumatoid arthritis, OSA, hypothyroidism, hyperlipidemia, CAD, atrial fibrillation, CKD stage IIIb, complete heart block s/p PPM, ILD, hypertension, BPH s/p TURP and diabetes. Patient presented secondary to weakness and lightheadedness with a history of black/dark stools and found to have focal occult positive stool and anemia requiring blood transfusion. [facility] consulted and performed upper endoscopy, without identification of bleeding source.   Assessment and Plan:  Melena Black/dark stools. Fecal occult test positive in the ED. Associated anemia. [facility] consulted and performed upper endoscopy on 12/30 which was significant for benign appearing esophageal stenosis, normal stomach and acquired duodenal stenosis. Colonoscopy performed on 12/31 was significant for diverticulosis and non-bleeding internal hemorrhoids. -GI recommendations: video capsule placed on 12/31 (read is pending)  Acute blood loss anemia Baseline hemoglobin is about 11-12. Hemoglobin of 6.0 on admission requiring transfusion of 2 units of PRBC. Post-transfusion hemoglobin of 7.9 with downward drift to 7.4. then rebound hemoglobin up to 8.5-8.7. Secondary to GI bleed. Hemoglobin down to 7.1 on 05/26/22 with 1 unit of PRBC ordered. During transfusion, patient developed fever and transfusion was discontinued; unlikely this was a transfusion reaction since patient was febrile the night prior to blood transfusion. Hemoglobin stable at 7.3 today. -Repeat H&H this evening and CBC tomorrow -Will hold off on repeat transfusion if possible to minimize possible inability to discern between transfusion reaction and infection -Likely transfuse for hemoglobin <8 in AM  Fever Unclear etiology. Patient with symptoms of rhinorrhea.  No other significant symptoms. Patient is concerned he may have a sinus infection. COVID-19, influenza, RSV and respiratory virus panel are negative for source. Blood cultures obtained on 1/1 and are pending. Initial procalcitonin undetectable but is up to 1.37 today. -Empiric Vancomycin/Cefepime/Flagyl -Follow-up blood cultures  Psoriatic arthritis Rheumatoid arthritis -Continue home leflunomide  OSA -Continue CPAP  Hypothyroidism -Continue thyroid 90 mg daily  Hyperlipidemia -Continue pravastatin  Esophageal stenosis Benign appearing. Dilated by GI on 12/30.  CAD History of PCI with DES in 2019. Patient is on aspirin and Xarelto as an outpatient. Aspirin and Xarelto held. GI recommending to restart Xarelto on 12/31. Now with recurrent acute anemia -Discontinue Xarelto  Atrial fibrillation, unspecified Patient is on diltiazem, nebivolol and Xarelto as an outpatient -Continue diltiazem and nebivolol  Primary hypertension Patient is on diltiazem, nebivolol, hydrochlorothiazide and telmisartan as an outpatient. -Continue diltiazem, nebivolol and telmisartan (irbesartan while inpatient)  CKD stage IIIb Recent baseline creatinine is about 1.3. Creatinine of 1.22 on admission and down to 1.04.  Complete heart block S/p PPM  ILD Noted. Patient follows with pulmonology -Continue Pirfenidone  BPH Noted. History of TURP.  Diabetes mellitus, type 2 Slightly uncontrolled with hyperglycemia for age with hemoglobin A1C of 8.1%. patient is on Jardiance and glimepiride for management as an outpatient. Started on SSI inpatient. -Continue SSI    DVT prophylaxis: SCDs Code Status:   Code Status: Full Code Family Communication: Wife at bedside Disposition Plan: Discharge home likely in 1-2 days if bleeding remains stopped, hemoglobin stabilizes, continued GI recommendations and fever workup   Consultants:  [facility]  Procedures:  12/30: Upper endoscopy 12/31:  Colonoscopy; Video capsule endoscopy  Antimicrobials: Vancomycin Cefepime Flagyl    Subjective: No bowel movement yet. Some rhinorrhea and some pain with inhalation through left nostril. No other issues.  Objective: BP 136/61 (BP Location: Right Arm)   Pulse 62   Temp 98.5 F (36.9 C) (Oral)   Resp 18   Ht _0  (1.702 m)   Wt 78 kg   SpO2 100%   BMI 26.94 kg/m   Examination:  General exam: Appears calm and comfortable HEENT: oropharynx unremarkable, no frontal/maxillary sinus tenderness, no anterior/posterior cervical lymphadenopathy Respiratory system: Clear to auscultation. Respiratory effort normal. Cardiovascular system: S1 & S2 heard. Gastrointestinal system: Abdomen is nondistended, soft and nontender. Normal bowel sounds heard. Central nervous system: Alert and oriented. No focal neurological deficits. Psychiatry: Judgement and insight appear normal. Mood & affect appropriate.    Data Reviewed: I have personally reviewed following labs and imaging studies  CBC Lab Results  Component Value Date   WBC 5.4 05/27/2022   RBC 2.39 (L) 05/27/2022   HGB 7.3 (L) 05/27/2022   HCT 22.9 (L) 05/27/2022   MCV 95.8 05/27/2022   MCH 30.5 05/27/2022   PLT 165 05/27/2022   MCHC 31.9 05/27/2022   RDW 18.5 (H) 05/27/2022   LYMPHSABS 2.6 07/25/2020   MONOABS 0.7 11/10/2018   EOSABS 0.3 07/25/2020   BASOSABS 0.1 93/81/8299     Last metabolic panel Lab Results  Component Value Date   NA 138 05/25/2022   K 4.4 05/25/2022   CL 109 05/25/2022   CO2 25 05/25/2022   BUN 24 (H) 05/25/2022   CREATININE 1.04 05/25/2022   GLUCOSE 101 (H) 05/25/2022   GFRNONAA >60 05/25/2022   GFRAA >60 08/02/2019   CALCIUM 9.0 05/25/2022   PHOS 3.0 12/09/2011   PROT 5.0 (L) 05/24/2022   ALBUMIN 2.6 (L) 05/24/2022   BILITOT 0.7 05/24/2022   ALKPHOS 45 05/24/2022   AST 15 05/24/2022   ALT 11 05/24/2022   ANIONGAP 4 (L) 05/25/2022    GFR: Estimated Creatinine Clearance: 51.2 mL/min  (by C-G formula based on SCr of 1.04 mg/dL).  Recent Results (from the past 240 hour(s))  Resp panel by RT-PCR (RSV, Flu A&B, Covid) Anterior Nasal Swab     Status: None   Collection Time: 05/23/22  2:46 PM   Specimen: Anterior Nasal Swab  Result Value Ref Range Status   SARS Coronavirus 2 by RT PCR NEGATIVE NEGATIVE Final    Comment: (NOTE) SARS-CoV-2 target nucleic acids are NOT DETECTED.  The SARS-CoV-2 RNA is generally detectable in upper respiratory specimens during the acute phase of infection. The lowest concentration of SARS-CoV-2 viral copies this assay can detect is 138 copies/mL. A negative result does not preclude SARS-Cov-2 infection and should not be used as the sole basis for treatment or other patient management decisions. A negative result may occur with  improper specimen collection/handling, submission of specimen other than nasopharyngeal swab, presence of viral mutation(s) within the areas targeted by this assay, and inadequate number of viral copies(<138 copies/mL). A negative result must be combined with clinical observations, patient history, and epidemiological information. The expected result is Negative.  Fact Sheet for Patients:  EntrepreneurPulse.com.au  Fact Sheet for Healthcare Providers:  IncredibleEmployment.be  This test is no t yet approved or cleared by the [place] FDA and  has been authorized for detection and/or diagnosis of SARS-CoV-2 by FDA under an Emergency Use Authorization (EUA). This EUA will remain  in effect (meaning this test can be used) for the duration of the COVID-19 declaration under Section 564(b)(1) of the Act, 21 U.S.C.section 360bbb-3(b)(1), unless the authorization is terminated  or revoked sooner.  Influenza A by PCR NEGATIVE NEGATIVE Final   Influenza B by PCR NEGATIVE NEGATIVE Final    Comment: (NOTE) The Xpert Xpress SARS-CoV-2/FLU/RSV plus assay is intended as an  aid in the diagnosis of influenza from Nasopharyngeal swab specimens and should not be used as a sole basis for treatment. Nasal washings and aspirates are unacceptable for Xpert Xpress SARS-CoV-2/FLU/RSV testing.  Fact Sheet for Patients: EntrepreneurPulse.com.au  Fact Sheet for Healthcare Providers: IncredibleEmployment.be  This test is not yet approved or cleared by the [place] FDA and has been authorized for detection and/or diagnosis of SARS-CoV-2 by FDA under an Emergency Use Authorization (EUA). This EUA will remain in effect (meaning this test can be used) for the duration of the COVID-19 declaration under Section 564(b)(1) of the Act, 21 U.S.C. section 360bbb-3(b)(1), unless the authorization is terminated or revoked.     Resp Syncytial Virus by PCR NEGATIVE NEGATIVE Final    Comment: (NOTE) Fact Sheet for Patients: EntrepreneurPulse.com.au  Fact Sheet for Healthcare Providers: IncredibleEmployment.be  This test is not yet approved or cleared by the [place] FDA and has been authorized for detection and/or diagnosis of SARS-CoV-2 by FDA under an Emergency Use Authorization (EUA). This EUA will remain in effect (meaning this test can be used) for the duration of the COVID-19 declaration under Section 564(b)(1) of the Act, 21 U.S.C. section 360bbb-3(b)(1), unless the authorization is terminated or revoked.  Performed at [facility], [place] [address], [place], [place] [address]   Culture, blood (Routine X 2) w Reflex to ID Panel     Status: None (Preliminary result)   Collection Time: 05/26/22  2:48 PM   Specimen: BLOOD RIGHT HAND  Result Value Ref Range Status   Specimen Description BLOOD RIGHT HAND  Final   Special Requests   Final    BOTTLES DRAWN AEROBIC AND ANAEROBIC Blood Culture adequate volume   Culture   Final    NO GROWTH < 24 HOURS Performed at [facility], [place] [address], [place], [place] [address]    Report Status PENDING  Incomplete  Culture, blood (Routine X 2) w Reflex to ID Panel     Status: None (Preliminary result)   Collection Time: 05/26/22  2:48 PM   Specimen: BLOOD RIGHT WRIST  Result Value Ref Range Status   Specimen Description BLOOD RIGHT WRIST  Final   Special Requests   Final    BOTTLES DRAWN AEROBIC AND ANAEROBIC Blood Culture adequate volume   Culture   Final    NO GROWTH < 24 HOURS Performed at [facility], [place] [address] [place], [place] [address]    Report Status PENDING  Incomplete  Respiratory (~20 pathogens) panel by PCR     Status: None   Collection Time: 05/26/22  4:59 PM   Specimen: Nasopharyngeal Swab; Respiratory  Result Value Ref Range Status   Adenovirus NOT DETECTED NOT DETECTED Final   Coronavirus 229E NOT DETECTED NOT DETECTED Final    Comment: (NOTE) The Coronavirus on the Respiratory Panel, DOES NOT test for the novel  Coronavirus (2019 nCoV)    Coronavirus HKU1 NOT DETECTED NOT DETECTED Final   Coronavirus NL63 NOT DETECTED NOT DETECTED Final   Coronavirus OC43 NOT DETECTED NOT DETECTED Final   Metapneumovirus NOT DETECTED NOT DETECTED Final   Rhinovirus / Enterovirus NOT DETECTED NOT DETECTED Final   Influenza A NOT DETECTED NOT DETECTED Final   Influenza B NOT DETECTED NOT DETECTED Final   Parainfluenza  Virus 1 NOT DETECTED NOT DETECTED Final   Parainfluenza Virus 2 NOT DETECTED NOT DETECTED Final   Parainfluenza Virus 3 NOT DETECTED NOT DETECTED Final   Parainfluenza Virus 4 NOT DETECTED NOT DETECTED Final   Respiratory Syncytial Virus NOT DETECTED NOT DETECTED Final   Bordetella pertussis NOT DETECTED NOT DETECTED Final   Bordetella Parapertussis NOT DETECTED NOT DETECTED Final   Chlamydophila pneumoniae NOT DETECTED NOT DETECTED Final   Mycoplasma pneumoniae NOT DETECTED NOT DETECTED Final    Comment: Performed at [facility], [place] [address], [place], [place] [address]   Resp panel by RT-PCR (RSV, Flu A&B, Covid) Anterior Nasal Swab     Status: None   Collection Time: 05/26/22  4:59 PM   Specimen: Anterior Nasal Swab  Result Value Ref Range Status   SARS Coronavirus 2 by RT PCR NEGATIVE NEGATIVE Final    Comment: (NOTE) SARS-CoV-2 target nucleic acids are NOT DETECTED.  The SARS-CoV-2 RNA is generally detectable in upper respiratory specimens during the acute phase of infection. The lowest concentration of SARS-CoV-2 viral copies this assay can detect is 138 copies/mL. A negative result does not preclude SARS-Cov-2 infection and should not be used as the sole basis for treatment or other patient management decisions. A negative result may occur with  improper specimen collection/handling, submission of specimen other than nasopharyngeal swab, presence of viral mutation(s) within the areas targeted by this assay, and inadequate number of viral copies(<138 copies/mL). A negative result must be combined with clinical observations, patient history, and epidemiological information. The expected result is Negative.  Fact Sheet for Patients:  EntrepreneurPulse.com.au  Fact Sheet for Healthcare Providers:  IncredibleEmployment.be  This test is no t yet approved or cleared by the [place] FDA and  has been authorized for detection and/or diagnosis of SARS-CoV-2 by FDA under an Emergency Use Authorization (EUA). This EUA will remain  in effect (meaning this test can be used) for the duration of the COVID-19 declaration under Section 564(b)(1) of the Act, 21 U.S.C.section 360bbb-3(b)(1), unless the authorization is terminated  or revoked sooner.       Influenza A by PCR NEGATIVE NEGATIVE Final   Influenza B by PCR NEGATIVE NEGATIVE Final    Comment: (NOTE) The Xpert Xpress SARS-CoV-2/FLU/RSV plus assay is intended as an aid in the diagnosis of influenza from Nasopharyngeal swab specimens and should not be used  as a sole basis for treatment. Nasal washings and aspirates are unacceptable for Xpert Xpress SARS-CoV-2/FLU/RSV testing.  Fact Sheet for Patients: EntrepreneurPulse.com.au  Fact Sheet for Healthcare Providers: IncredibleEmployment.be  This test is not yet approved or cleared by the [place] FDA and has been authorized for detection and/or diagnosis of SARS-CoV-2 by FDA under an Emergency Use Authorization (EUA). This EUA will remain in effect (meaning this test can be used) for the duration of the COVID-19 declaration under Section 564(b)(1) of the Act, 21 U.S.C. section 360bbb-3(b)(1), unless the authorization is terminated or revoked.     Resp Syncytial Virus by PCR NEGATIVE NEGATIVE Final    Comment: (NOTE) Fact Sheet for Patients: EntrepreneurPulse.com.au  Fact Sheet for Healthcare Providers: IncredibleEmployment.be  This test is not yet approved or cleared by the [place] FDA and has been authorized for detection and/or diagnosis of SARS-CoV-2 by FDA under an Emergency Use Authorization (EUA). This EUA will remain in effect (meaning this test can be used) for the duration of the COVID-19 declaration under Section 564(b)(1) of the Act, 21 U.S.C. section 360bbb-3(b)(1), unless  the authorization is terminated or revoked.  Performed at [facility], [place] [address], [place], [place] [address]       Radiology Studies: No results found.    LOS: 3 days    Cordelia Poche, MD [facility] 05/27/2022, 1:15 PM   If 7PM-7AM, please contact night-coverage [email or web address]

## 2022-05-27 NOTE — Progress Notes (Signed)
Daily Rounding Note  05/27/2022, 2:25 PM  LOS: 3 days   SUBJECTIVE:   Chief complaint:  anemia, FOBT + dark stool    No stools at all since just after colonoscopy 12/31.  Patient is feeling well.  He has been confined to his room because of unexplained fever but denies shortness of breath, dizziness, chest pressure, weakness.  Tolerating solid food.  OBJECTIVE:         Vital signs in last 24 hours:    Temp:  [98.1 F (36.7 C)-101.6 F (38.7 C)] 98.5 F (36.9 C) (01/02 0752) Pulse Rate:  [59-67] 62 (01/02 0752) Resp:  [17-18] 18 (01/02 0752) BP: (94-136)/(43-61) 136/61 (01/02 0752) SpO2:  [96 %-100 %] 100 % (01/02 0752) Last BM Date : 05/25/22 Filed Weights   05/23/22 1026 05/24/22 0908  Weight: 78 kg 78 kg   General: Sleeping but awakened easily.  A bit pale.  NAD.  Does not look ill.  Resting comfortably. Heart: RRR. Chest: Clear bilaterally.  No labored breathing or cough. Abdomen: Soft without distention or tenderness.  Active bowel sounds. Extremities: No CCE. Neuro/Psych: Alert.  Appropriate.  Moves all 4 limbs without tremor or gross weakness.  Fluid speech.  Calm.  Intake/Output from previous day: 01/01 0701 - 01/02 0700 In: 1137.4 [P.O.:460; I.V.:467.4; Blood:210] Out: -   Intake/Output this shift: Total I/O In: 400 [P.O.:400] Out: -   Lab Results: Recent Labs    05/25/22 0703 05/26/22 0322 05/26/22 0834 05/26/22 1832 05/27/22 0334  WBC 7.1 8.1  --   --  5.4  HGB 8.7* 7.1* 7.1* 8.1* 7.3*  HCT 26.4* 21.5* 22.3* 25.2* 22.9*  PLT 185 167  --   --  165   BMET Recent Labs    05/25/22 0703  NA 138  K 4.4  CL 109  CO2 25  GLUCOSE 101*  BUN 24*  CREATININE 1.04  CALCIUM 9.0   LFT No results for input(s): "PROT", "ALBUMIN", "AST", "ALT", "ALKPHOS", "BILITOT", "BILIDIR", "IBILI" in the last 72 hours. PT/INR No results for input(s): "LABPROT", "INR" in the last 72 hours. Hepatitis  Panel No results for input(s): "HEPBSAG", "HCVAB", "HEPAIGM", "HEPBIGM" in the last 72 hours.  Studies/Results: No results found.  Scheduled Meds:  sodium chloride   Intravenous Once   diltiazem  120 mg Oral Daily   gabapentin  300 mg Oral QHS   insulin aspart  0-9 Units Subcutaneous TID WC   leflunomide  10 mg Oral Daily   nebivolol  10 mg Oral Daily   pantoprazole (PROTONIX) IV  40 mg Intravenous Q12H   Pirfenidone  267 mg Oral TID WC   pravastatin  20 mg Oral QPM   sodium chloride flush  3 mL Intravenous Q12H   thyroid  90 mg Oral BH-q7a   Continuous Infusions:  ceFEPime (MAXIPIME) IV 2 g (05/27/22 1017)   lactated ringers 75 mL/hr at 05/27/22 0607   metronidazole 500 mg (05/27/22 1138)   [START ON 05/28/2022] vancomycin     vancomycin 1,500 mg (05/27/22 1243)   PRN Meds:.acetaminophen **OR** acetaminophen   ASSESMENT:   UGI bleed.  With black, FOBT + stool.  05/24/2022 EGD: Esophageal stenosis dilated to 18 mm.  Duodenal stenosis.  12/31 colonoscopy with no bleeding, diverticulosis, internal hemorrhoids.  VCE placed 12/31, await reading.  Describes dysphagia, 09/2021 esophagram w dysmotility, no stricture.  Blood loss anemia.  3 PRBCs thus far, 2 on 12/29, 1 on 1/1.  Hgb 6..  8.7..  7.1..  8.1..  7.3.    Fever 101.6 yesterday, no recurrence.  Respiratory pathology panel negative, blood cultures negative day 2 empiric vanc, metronidazole, cefepime in place.  Fever occurred around 1600, had completed his third PRBC at 1400.  Azotemia with BUN as high as 49 at arrival, down to 24 today.  Creatinine has been normal.  PLAN     Await reading VCE.  Switch to po Protonix 40 mg daily.    Home tomorrow?     Azucena Freed  05/27/2022, 2:25 PM Phone [phone]

## 2022-05-27 NOTE — Care Management Important Message (Signed)
Important Message  Patient Details  [name] MRN: [id number] Date of Birth: [birth year]   Medicare Important Message Given:  Yes     Hannah Beat 05/27/2022, 11:43 AM

## 2022-05-27 NOTE — Progress Notes (Signed)
Pharmacy Antibiotic Note  Cameron [name] is a 82 y.o. male with fevers and possible sepsis.  Pharmacy has been consulted for Vancomycin and Cefepime dosing.  Plan: Vancomycin 1500 mg IV now, then 1250 mg IV q24h Cefepime 2 g IV q12h  Height: _0  (170.2 cm) Weight: 78 kg (172 lb) IBW/kg (Calculated) : 66.1  Temp (24hrs), Avg:99 F (37.2 C), Min:97.9 F (36.6 C), Max:101.6 F (38.7 C)  Recent Labs  Lab 05/21/22 1208 05/23/22 1216 05/23/22 2358 05/24/22 0631 05/24/22 1809 05/25/22 0703 05/26/22 0322 05/27/22 0334  WBC  --  8.7   < > 8.0 8.2 7.1 8.1 5.4  CREATININE 1.41 1.22  --  1.09  --  1.04  --   --    < > = values in this interval not displayed.    Estimated Creatinine Clearance: 51.2 mL/min (by C-G formula based on SCr of 1.04 mg/dL).    Allergies  Allergen Reactions   Ace Inhibitors Swelling    Tongue swelling, angioedema   Lisinopril     Other reaction(s): Lip swelling, O/E - lip swelling   Celecoxib Rash    Skin rash     Caryl Pina 05/27/2022 6:59 AM

## 2022-05-27 NOTE — Progress Notes (Signed)
Refused cpap.

## 2022-05-28 DIAGNOSIS — Z7901 Long term (current) use of anticoagulants: Secondary | ICD-10-CM

## 2022-05-28 DIAGNOSIS — D500 Iron deficiency anemia secondary to blood loss (chronic): Secondary | ICD-10-CM | POA: Diagnosis not present

## 2022-05-28 DIAGNOSIS — I2510 Atherosclerotic heart disease of native coronary artery without angina pectoris: Secondary | ICD-10-CM | POA: Diagnosis not present

## 2022-05-28 DIAGNOSIS — E039 Hypothyroidism, unspecified: Secondary | ICD-10-CM | POA: Diagnosis not present

## 2022-05-28 DIAGNOSIS — R195 Other fecal abnormalities: Secondary | ICD-10-CM

## 2022-05-28 LAB — CBC
HCT: 21.3 % — ABNORMAL LOW (ref 39.0–52.0)
Hemoglobin: 7 g/dL — ABNORMAL LOW (ref 13.0–17.0)
MCH: 31.3 pg (ref 26.0–34.0)
MCHC: 32.9 g/dL (ref 30.0–36.0)
MCV: 95.1 fL (ref 80.0–100.0)
Platelets: 172 10*3/uL (ref 150–400)
RBC: 2.24 MIL/uL — ABNORMAL LOW (ref 4.22–5.81)
RDW: 17.9 % — ABNORMAL HIGH (ref 11.5–15.5)
WBC: 5.1 10*3/uL (ref 4.0–10.5)
nRBC: 0 % (ref 0.0–0.2)

## 2022-05-28 LAB — HEMOGLOBIN AND HEMATOCRIT, BLOOD
HCT: 22.9 % — ABNORMAL LOW (ref 39.0–52.0)
Hemoglobin: 7.6 g/dL — ABNORMAL LOW (ref 13.0–17.0)

## 2022-05-28 LAB — BASIC METABOLIC PANEL
Anion gap: 8 (ref 5–15)
BUN: 25 mg/dL — ABNORMAL HIGH (ref 8–23)
CO2: 23 mmol/L (ref 22–32)
Calcium: 8.4 mg/dL — ABNORMAL LOW (ref 8.9–10.3)
Chloride: 105 mmol/L (ref 98–111)
Creatinine, Ser: 1.3 mg/dL — ABNORMAL HIGH (ref 0.61–1.24)
GFR, Estimated: 55 mL/min — ABNORMAL LOW (ref >60)
Glucose, Bld: 166 mg/dL — ABNORMAL HIGH (ref 70–99)
Potassium: 4.5 mmol/L (ref 3.5–5.1)
Sodium: 136 mmol/L (ref 135–145)

## 2022-05-28 LAB — GLUCOSE, CAPILLARY
Glucose-Capillary: 131 mg/dL — ABNORMAL HIGH (ref 70–99)
Glucose-Capillary: 139 mg/dL — ABNORMAL HIGH (ref 70–99)
Glucose-Capillary: 161 mg/dL — ABNORMAL HIGH (ref 70–99)
Glucose-Capillary: 162 mg/dL — ABNORMAL HIGH (ref 70–99)
Glucose-Capillary: 110 mg/dL — ABNORMAL HIGH (ref 70–99)

## 2022-05-28 LAB — TRANSFUSION REACTION
DAT C3: NEGATIVE
Post RXN DAT IgG: NEGATIVE

## 2022-05-28 LAB — PROCALCITONIN: Procalcitonin: 0.83 ng/mL

## 2022-05-28 MED ORDER — VANCOMYCIN HCL IN DEXTROSE 1-5 GM/200ML-% IV SOLN
1000.0000 mg | INTRAVENOUS | Status: DC
  Administered 2022-05-28: 1000 mg via INTRAVENOUS
  Filled 2022-05-28: qty 200

## 2022-05-28 MED ORDER — AMOXICILLIN-POT CLAVULANATE 875-125 MG PO TABS
1.0000 | ORAL_TABLET | Freq: Two times a day (BID) | ORAL | Status: DC
  Administered 2022-05-28 – 2022-05-29 (×3): 1 via ORAL
  Filled 2022-05-28 (×3): qty 1

## 2022-05-28 MED ORDER — HEPARIN (PORCINE) 25000 UT/250ML-% IV SOLN
1500.0000 [IU]/h | INTRAVENOUS | Status: DC
  Administered 2022-05-28: 1100 [IU]/h via INTRAVENOUS
  Administered 2022-05-29 – 2022-05-30 (×2): 1500 [IU]/h via INTRAVENOUS
  Filled 2022-05-28 (×2): qty 250

## 2022-05-28 MED ORDER — POLYETHYLENE GLYCOL 3350 17 G PO PACK
17.0000 g | PACK | Freq: Once | ORAL | Status: AC
  Administered 2022-05-28: 17 g via ORAL
  Filled 2022-05-28: qty 1

## 2022-05-28 NOTE — Care Management (Signed)
  Transition of Care (TOC) Screening Note   Patient Details  [name] Date of Birth: 10/30/1940   Transition of Care [facility]) CM/SW Contact:    Carles Collet, RN Phone Number: 05/28/2022, 4:24 PM    Transition of Care Department [facility]) has reviewed patient and no TOC needs have been identified at this time. We will continue to monitor patient advancement through interdisciplinary progression rounds. If new patient transition needs arise, please place a TOC consult.

## 2022-05-28 NOTE — Progress Notes (Signed)
Capsule endoscopy result note:  Prolonged gastric emptying of capsule - 5 hours and 38 minutes. Small bowel transit of 2 hours and 4 minutes. Complete capsule endoscopy. Mostly adequate prep (some areas with limited visualization due to prep)  Normal small bowel - no pathology noted to cause anemia or bleeding. No heme noted anywhere in the small bowel.  Discussed with Dr. Tarri Glenn. EGD and colonoscopy without clear cause. Patient has not had any overt bleeding while in the hospital. Consider CT or MR enterography study to ensure no other missed small bowel process given some limitations of the prep on this exam.   Jolly Mango, MD [facility]

## 2022-05-28 NOTE — Progress Notes (Signed)
ANTICOAGULATION CONSULT NOTE - Initial Consult  Pharmacy Consult for IV Heparin Indication: atrial fibrillation  Allergies  Allergen Reactions   Ace Inhibitors Swelling    Tongue swelling, angioedema   Lisinopril     Other reaction(s): Lip swelling, O/E - lip swelling   Celecoxib Rash    Skin rash     Patient Measurements: Height: 5' 7" (170.2 cm) Weight: 78 kg (172 lb) IBW/kg (Calculated) : 66.1 Heparin Dosing Weight: 78 kg  Vital Signs: Temp: 98.4 F (36.9 C) (01/03 1754) Temp Source: Oral (01/03 1754) BP: 131/52 (01/03 1754) Pulse Rate: 60 (01/03 1754)  Labs: Recent Labs    05/26/22 0322 05/26/22 0834 05/27/22 0334 05/27/22 1730 05/28/22 0224 05/28/22 1555  HGB 7.1*   < > 7.3* 8.4* 7.0* 7.6*  HCT 21.5*   < > 22.9* 26.6* 21.3* 22.9*  PLT 167  --  165  --  172  --   CREATININE  --   --   --   --  1.30*  --    < > = values in this interval not displayed.    Estimated Creatinine Clearance: 41 mL/min (A) (by C-G formula based on SCr of 1.3 mg/dL (H)).  Medications:  Scheduled:   amoxicillin-clavulanate  1 tablet Oral Q12H   diltiazem  120 mg Oral Daily   gabapentin  300 mg Oral QHS   insulin aspart  0-9 Units Subcutaneous TID WC   leflunomide  10 mg Oral Daily   nebivolol  10 mg Oral Daily   pantoprazole  40 mg Oral Daily   Pirfenidone  267 mg Oral TID WC   pravastatin  20 mg Oral QPM   sodium chloride flush  3 mL Intravenous Q12H   thyroid  90 mg Oral BH-q7a   Infusions:   lactated ringers 75 mL/hr at 05/28/22 1627    Assessment: 82 years of age male admitted with melena on Xarelto prior to admission for atrial fibrillation. Normal upper endoscopy 12/30. Colonoscopy 12/31 with diverticulosis and non-bleeding internal hemorrhoids. Patient received dose of Xarelto 12/31- last dose, then held. Hgb 7.6. Platelets within normal limits. Patient underwent capsule endoscopy which was negative for active bleeding. Pharmacy consulted to start IV heparin therapy  for anticoagulation and monitor.   Goal of Therapy:  Heparin level 0.3-0.7 units/ml aPTT 66-102 seconds Monitor platelets by anticoagulation protocol: Yes   Plan:  Start IV Heparin at 1100 units/hr (no bolus with recent melena).  aPTT and Heparin level in 8 hours.  Daily aPTT, Heparin level, and CBC while on therapy.   Sloan Leiter, PharmD, BCPS, BCCCP Please refer to [facility] for [place] numbers 05/28/2022,5:56 PM

## 2022-05-28 NOTE — Progress Notes (Signed)
Mobility Specialist - Progress Note   05/28/22 1011  Mobility  Activity Ambulated independently in hallway  Level of Assistance Modified independent, requires aide device or extra time  Assistive Device Other (Comment) (IV Pole)  Distance Ambulated (ft) 550 ft  Activity Response Tolerated well  Mobility Referral No (Independent)  $Mobility charge 1 Mobility    Pt ambulated independently in hallway. No physical assistance needed.  [place] Specialist Please contact via SecureChat or Rehab office at [phone]

## 2022-05-28 NOTE — Progress Notes (Signed)
Progress Note  Primary GI: Dr. Loistine Simas at [facility] in [place].   Scheduled for upcoming colonoscopy 06/10/22 w Dr Andrey Farmer    Subjective  Chief Complaint:HGB 6, melenic stools  Cameron [name], wife is at bedside.  No AB pain, no nausea or vomiting.  He is eating well.  He is walking the halls.  Denies any further fever or chills. No Bm's since his colonoscopy. Has some baseline constipation per wife, had miralax yesterday.     Objective   Vital signs in last 24 hours: Temp:  [98 F (36.7 C)-98.9 F (37.2 C)] 98.1 F (36.7 C) (01/03 0817) Pulse Rate:  [60-65] 64 (01/03 0817) Resp:  [16-18] 18 (01/03 0817) BP: (123-139)/(49-63) 139/57 (01/03 0817) SpO2:  [97 %-100 %] 97 % (01/03 0817) Last BM Date : 05/25/22 Last BM recorded by nurses in past 5 days No data recorded  General:   male in no acute distress  Heart:  Regular rate and rhythm; no murmurs Pulm: Clear anteriorly; no wheezing Abdomen:  Soft, Obese AB, Active bowel sounds. No tenderness . Without guarding and Without rebound, No organomegaly appreciated. Extremities:  without  edema. Neurologic:  Alert and  oriented x4;  No focal deficits.  Psych:  Cooperative. Normal mood and affect.  Intake/Output from previous day: 01/02 0701 - 01/03 0700 In: 1419 [P.O.:400; I.V.:612.3; IV Piggyback:406.8] Out: -  Intake/Output this shift: Total I/O In: 300 [P.O.:300] Out: -   Studies/Results: No results found.  Lab Results: Recent Labs    05/26/22 0322 05/26/22 0834 05/27/22 0334 05/27/22 1730 05/28/22 0224  WBC 8.1  --  5.4  --  5.1  HGB 7.1*   < > 7.3* 8.4* 7.0*  HCT 21.5*   < > 22.9* 26.6* 21.3*  PLT 167  --  165  --  172   < > = values in this interval not displayed.   BMET Recent Labs    05/28/22 0224  NA 136  K 4.5  CL 105  CO2 23  GLUCOSE 166*  BUN 25*  CREATININE 1.30*  CALCIUM 8.4*   LFT No results for input(s): "PROT", "ALBUMIN", "AST", "ALT", "ALKPHOS", "BILITOT", "BILIDIR",  "IBILI" in the last 72 hours. PT/INR No results for input(s): "LABPROT", "INR" in the last 72 hours.   Scheduled Meds:  amoxicillin-clavulanate  1 tablet Oral Q12H   diltiazem  120 mg Oral Daily   gabapentin  300 mg Oral QHS   insulin aspart  0-9 Units Subcutaneous TID WC   leflunomide  10 mg Oral Daily   nebivolol  10 mg Oral Daily   pantoprazole  40 mg Oral Daily   Pirfenidone  267 mg Oral TID WC   pravastatin  20 mg Oral QPM   sodium chloride flush  3 mL Intravenous Q12H   thyroid  90 mg Oral BH-q7a   Continuous Infusions:  lactated ringers 75 mL/hr at 05/28/22 0641      Patient profile:   82 year old male with history of A-fib on Xarelto, SSS status post PM, ASD status PCI, OSA on CPAP with OSA overlap, hypothyroidism, psoriatic arthritis, diabetes presents with melena.   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 Colonoscopy 12/31 showed diverticulosis and internal hemorrhoids.  VCE was swallowed on 12/31 but is not able to be uploaded until yesterday Pending read tonight or tomorrow.  Hemoglobin went from 8.4-7.0, was  getting transfuse 1 unit of blood when had cold shakes and chills, blood product was discontinued. No further bowel movements, possibly decreased from catch-up/old blood. Continue to monitor hemoglobin, consider IV iron versus pretreated transfusion if needed. Continue to hold xarelto while awaiting capsule endoscopy results, hopefully will have more information once those results come back today or tomorrow.  History of constipation No bowel movement since colonoscopy had 1 dose of MiraLAX yesterday will add 1 more dose today.  Consider Dulcolax if no bowel movement tomorrow.  Fever 101 yesterday, Tmax 98.9 today, was after completing third PRBC high probability blood reaction. 05/28/2022 WBC 5.1  Respiratory pathology panel negative,  blood cultures negative day 2 Empiric Vanco, metronidazole and cefepime in place.  Azotemia BUN as high as 49 Currently 25, has had a slight increase in creatinine 1.30. Continue to monitor.  Will continue to follow along with you.  Principal Problem:   GI bleed Active Problems:   Complete heart block (HCC)   Hypertension   Atrial fibrillation (HCC)   Hyperlipidemia, unspecified   Hypothyroidism   Hypothyroidism, unspecified   Rheumatoid arthritis ([place])   Coronary artery disease   OSA (obstructive sleep apnea)   ILD (interstitial lung disease) ([place])   Benign prostatic hyperplasia with urinary frequency   Anemia due to GI blood loss   Dysphagia    LOS: 4 days   Cameron [name]  05/28/2022, 2:03 PM

## 2022-05-28 NOTE — Progress Notes (Signed)
PROGRESS NOTE  Cameron [name] QMV:[id number] DOB: 1940/07/15 DOA: 05/23/2022 PCP: [name], MD   LOS: 4 days   Brief Narrative / Interim history: Cameron [name] is a 82 y.o. male with a history of psoriatic arthritis, rheumatoid arthritis, OSA, hypothyroidism, hyperlipidemia, CAD, atrial fibrillation, CKD stage IIIb, complete heart block s/p PPM, ILD, hypertension, BPH s/p TURP and diabetes. Patient presented secondary to weakness and lightheadedness with a history of black/dark stools and found to have focal occult positive stool and anemia requiring blood transfusion. [facility] consulted and performed upper endoscopy, without identification of bleeding source.   Subjective / 24h Interval events: Continues to feel weak but overall doing well.  Has not had any bowel movements since his colonoscopy  Assesement and Plan: Principal Problem:   GI bleed Active Problems:   Complete heart block (HCC)   Hypertension   Atrial fibrillation (HCC)   Hyperlipidemia, unspecified   Hypothyroidism   Hypothyroidism, unspecified   Rheumatoid arthritis ([place])   Coronary artery disease   OSA (obstructive sleep apnea)   ILD (interstitial lung disease) (HCC)   Benign prostatic hyperplasia with urinary frequency   Anemia due to GI blood loss   Dysphagia  Principal problem Melena -Black/dark stools. Fecal occult test positive in the ED. Associated anemia. [facility] consulted and performed upper endoscopy on 12/30 which was significant for benign appearing esophageal stenosis, normal stomach and acquired duodenal stenosis. Colonoscopy performed on 12/31 was significant for diverticulosis and non-bleeding internal hemorrhoids. -GI recommendations: video capsule placed on 12/31 (read is pending)  Active problems  Acute blood loss anemia -Baseline hemoglobin is about 11-12. Hemoglobin of 6.0 on admission requiring transfusion of 2 units of PRBC. Post-transfusion hemoglobin of 7.9 with downward  drift to 7.4. then rebound hemoglobin up to 8.5-8.7.  Hemoglobin drifted down to 7.1 on 1/1, received another unit of packed red blood cells but developed fever during transfusion.  Of note, he was febrile the night before so he was not believed like he was related to that.  Repeat H&H this morning unfortunately drifting back down to 7.0.  Patient and wife hesitant for transfusion, recheck hemoglobin this afternoon and see where it is.  Appreciate GI follow-up  Fever -Unclear etiology, 1/1, currently afebrile. Patient with symptoms of rhinorrhea. No other significant symptoms. Patient is concerned he may have a sinus infection. COVID-19, influenza, RSV and respiratory virus panel are negative for source. Blood cultures obtained on 1/1 and without growth.  He was placed on empiric antibiotics, but with negative cultures narrowed down to Augmentin for presumed sinus infection, given elevated procalcitonin.   Psoriatic arthritis, Rheumatoid arthritis -Continue home leflunomide   OSA -Continue CPAP   Hypothyroidism -Continue thyroid 90 mg daily   Hyperlipidemia -Continue pravastatin   Esophageal stenosis -Benign appearing. Dilated by GI on 12/30.   CAD  -History of PCI with DES in 2019. Patient is on aspirin and Xarelto as an outpatient. Aspirin and Xarelto held. GI recommending to restart Xarelto on 12/31. Now with recurrent acute anemia -Discontinue Xarelto, presumption complicated by blood loss anemia, hemoglobin trending down   Atrial fibrillation, unspecified -Patient is on diltiazem, nebivolol and Xarelto as an outpatient. Continue diltiazem and nebivolol   Primary hypertension - Patient is on diltiazem, nebivolol, hydrochlorothiazide and telmisartan as an outpatient.   CKD stage IIIb -Recent baseline creatinine is about 1.3. Creatinine of 1.22 on admission and currently at baseline around 1.3.  Continue to monitor   Complete heart block - S/p PPM  ILD - Noted. Patient follows with  pulmonology. Continue Pirfenidone   BPH -Noted. History of TURP.   Diabetes mellitus, type 2 - Slightly uncontrolled with hyperglycemia but acceptable in his age group.  A1c 8.1.  On sliding scale while here.    CBG (last 3)  Recent Labs    05/28/22 0401 05/28/22 0814 05/28/22 1201  GLUCAP 131* 139* 161*   Scheduled Meds:  amoxicillin-clavulanate  1 tablet Oral Q12H   diltiazem  120 mg Oral Daily   gabapentin  300 mg Oral QHS   insulin aspart  0-9 Units Subcutaneous TID WC   leflunomide  10 mg Oral Daily   nebivolol  10 mg Oral Daily   pantoprazole  40 mg Oral Daily   Pirfenidone  267 mg Oral TID WC   polyethylene glycol  17 g Oral Once   pravastatin  20 mg Oral QPM   sodium chloride flush  3 mL Intravenous Q12H   thyroid  90 mg Oral BH-q7a   Continuous Infusions:  lactated ringers 75 mL/hr at 05/28/22 0641   PRN Meds:.acetaminophen **OR** acetaminophen  Current Outpatient Medications  Medication Instructions   acetaminophen (TYLENOL) 500 mg, Oral, Every 6 hours PRN   allopurinol (ZYLOPRIM) 300 mg, Oral, Daily   aspirin EC 81 mg, Oral, Daily   clobetasol (OLUX) 0.05 % topical foam 1 Application, Topical, 2 times daily, Scalp,ears and elbows   diltiazem (CARDIZEM CD) 120 mg, Oral, Daily, Please schedule appointment for further refills   docusate sodium (COLACE) 100 mg, Oral, 2 times daily   empagliflozin (JARDIANCE) 10 mg, Oral, Daily   gabapentin (NEURONTIN) 300 mg, Oral, Daily at bedtime   glimepiride (AMARYL) 2 mg, Oral, Every morning   hydrochlorothiazide (HYDRODIURIL) 12.5 mg, Oral, Daily PRN   leflunomide (ARAVA) 10 mg, Oral, Daily   nebivolol (BYSTOLIC) 5 mg, Oral, Daily   nitroGLYCERIN (NITROSTAT) 0.4 mg, Sublingual, Every 5 min PRN   Omega-3 Fatty Acids (OMEGA-3 FISH OIL PO) 1 capsule, Oral, Daily   pantoprazole (PROTONIX) 40 mg, Oral, Daily   Pirfenidone (ESBRIET) 534 mg, Oral, 3 times daily with meals, **NOTE LOW DOSE due to kidney function**   pravastatin  (PRAVACHOL) 20 mg, Oral, Every evening   rivaroxaban (XARELTO) 20 MG TABS tablet TAKE 1 TABLET DAILY WITH SUPPER   telmisartan (MICARDIS) 80 mg, Oral, Daily   terbinafine (LAMISIL) 1 % cream Apply twice daily to buttocks and left thigh rash until clear   thyroid (ARMOUR) 90 mg, Oral, BH-each morning   traZODone (DESYREL) 50 mg, Oral, At bedtime PRN   triamcinolone ointment (KENALOG) 0.1 % Apply twice daily to arms up to 2 weeks as needed. Avoid applying to face, groin, and axilla.   Trospium Chloride 60 MG CP24 TAKE 1 CAPSULE BY MOUTH DAILY   VTAMA 1 % CREA 1 application , Apply externally, Daily    Diet Orders (From admission, onward)     Start     Ordered   05/25/22 1157  Diet Heart Room service appropriate? Yes; Fluid consistency: Thin  Diet effective now       Question Answer Comment  Room service appropriate? Yes   Fluid consistency: Thin      05/25/22 1156            DVT prophylaxis: SCDs Start: 05/23/22 1301   Lab Results  Component Value Date   PLT 172 05/28/2022      Code Status: Full Code  Family Communication: wife at bedside   Status is: Inpatient  Remains inpatient appropriate because: ongoing anemia   Level of care: Med-Surg  Consultants:  GI  Objective: Vitals:   05/27/22 1627 05/27/22 1934 05/28/22 0400 05/28/22 0817  BP: (!) 123/57 (!) 125/49 137/63 (!) 139/57  Pulse: 60 65 61 64  Resp: _0 Temp: 98.9 F (37.2 C) 98 F (36.7 C) 98 F (36.7 C) 98.1 F (36.7 C)  TempSrc: Oral Oral Oral Oral  SpO2: 99% 100% 100% 97%  Weight:      Height:        Intake/Output Summary (Last 24 hours) at 05/28/2022 1416 Last data filed at 05/28/2022 1140 Gross per 24 hour  Intake 1319.01 ml  Output --  Net 1319.01 ml   Wt Readings from Last 3 Encounters:  05/24/22 78 kg  05/06/22 78.7 kg  04/01/22 82.4 kg    Examination:  Constitutional: NAD Eyes: no scleral icterus ENMT: Mucous membranes are moist.  Neck: normal, supple Respiratory:  clear to auscultation bilaterally, no wheezing, no crackles. Normal respiratory effort. No accessory muscle use.  Cardiovascular: Regular rate and rhythm, no murmurs / rubs / gallops. No LE edema.  Abdomen: non distended, no tenderness. Bowel sounds positive.  Musculoskeletal: no clubbing / cyanosis.   Data Reviewed: I have independently reviewed following labs and imaging studies  CBC Recent Labs  Lab 05/24/22 1809 05/25/22 0703 05/26/22 0322 05/26/22 0834 05/26/22 1832 05/27/22 0334 05/27/22 1730 05/28/22 0224  WBC 8.2 7.1 8.1  --   --  5.4  --  5.1  HGB 8.5* 8.7* 7.1* 7.1* 8.1* 7.3* 8.4* 7.0*  HCT 25.6* 26.4* 21.5* 22.3* 25.2* 22.9* 26.6* 21.3*  PLT 179 185 167  --   --  165  --  172  MCV 94.1 95.0 95.1  --   --  95.8  --  95.1  MCH 31.3 31.3 31.4  --   --  30.5  --  31.3  MCHC 33.2 33.0 33.0  --   --  31.9  --  32.9  RDW 19.9* 19.5* 18.9*  --   --  18.5*  --  17.9*    Recent Labs  Lab 05/23/22 1216 05/24/22 0631 05/25/22 0703 05/26/22 1448 05/27/22 0334 05/28/22 0224  NA 139 140 138  --   --  136  K 4.6 4.0 4.4  --   --  4.5  CL 109 108 109  --   --  105  CO2 20* 23 25  --   --  23  GLUCOSE 138* 92 101*  --   --  166*  BUN 48* 33* 24*  --   --  25*  CREATININE 1.22 1.09 1.04  --   --  1.30*  CALCIUM 8.7* 8.7* 9.0  --   --  8.4*  AST  --  15  --   --   --   --   ALT  --  11  --   --   --   --   ALKPHOS  --  45  --   --   --   --   BILITOT  --  0.7  --   --   --   --   ALBUMIN  --  2.6*  --   --   --   --   PROCALCITON  --   --   --  <0.10 1.37 0.83    ------------------------------------------------------------------------------------------------------------------ No results for input(s): "CHOL", "HDL", "LDLCALC", "TRIG", "CHOLHDL", "LDLDIRECT" in the last 72 hours.  No results found for: "HGBA1C" ------------------------------------------------------------------------------------------------------------------ No results for input(s): "TSH", "T4TOTAL",  "T3FREE", "THYROIDAB" in the last 72 hours.  Invalid input(s): "FREET3"  Cardiac Enzymes No results for input(s): "CKMB", "TROPONINI", "MYOGLOBIN" in the last 168 hours.  Invalid input(s): "CK" ------------------------------------------------------------------------------------------------------------------ No results found for: "BNP"  CBG: Recent Labs  Lab 05/27/22 1937 05/27/22 2323 05/28/22 0401 05/28/22 0814 05/28/22 1201  GLUCAP 128* 158* 131* 139* 161*    Recent Results (from the past 240 hour(s))  Resp panel by RT-PCR (RSV, Flu A&B, Covid) Anterior Nasal Swab     Status: None   Collection Time: 05/23/22  2:46 PM   Specimen: Anterior Nasal Swab  Result Value Ref Range Status   SARS Coronavirus 2 by RT PCR NEGATIVE NEGATIVE Final    Comment: (NOTE) SARS-CoV-2 target nucleic acids are NOT DETECTED.  The SARS-CoV-2 RNA is generally detectable in upper respiratory specimens during the acute phase of infection. The lowest concentration of SARS-CoV-2 viral copies this assay can detect is 138 copies/mL. A negative result does not preclude SARS-Cov-2 infection and should not be used as the sole basis for treatment or other patient management decisions. A negative result may occur with  improper specimen collection/handling, submission of specimen other than nasopharyngeal swab, presence of viral mutation(s) within the areas targeted by this assay, and inadequate number of viral copies(<138 copies/mL). A negative result must be combined with clinical observations, patient history, and epidemiological information. The expected result is Negative.  Fact Sheet for Patients:  EntrepreneurPulse.com.au  Fact Sheet for Healthcare Providers:  IncredibleEmployment.be  This test is no t yet approved or cleared by the [place] FDA and  has been authorized for detection and/or diagnosis of SARS-CoV-2 by FDA under an Emergency Use  Authorization (EUA). This EUA will remain  in effect (meaning this test can be used) for the duration of the COVID-19 declaration under Section 564(b)(1) of the Act, 21 U.S.C.section 360bbb-3(b)(1), unless the authorization is terminated  or revoked sooner.       Influenza A by PCR NEGATIVE NEGATIVE Final   Influenza B by PCR NEGATIVE NEGATIVE Final    Comment: (NOTE) The Xpert Xpress SARS-CoV-2/FLU/RSV plus assay is intended as an aid in the diagnosis of influenza from Nasopharyngeal swab specimens and should not be used as a sole basis for treatment. Nasal washings and aspirates are unacceptable for Xpert Xpress SARS-CoV-2/FLU/RSV testing.  Fact Sheet for Patients: EntrepreneurPulse.com.au  Fact Sheet for Healthcare Providers: IncredibleEmployment.be  This test is not yet approved or cleared by the [place] FDA and has been authorized for detection and/or diagnosis of SARS-CoV-2 by FDA under an Emergency Use Authorization (EUA). This EUA will remain in effect (meaning this test can be used) for the duration of the COVID-19 declaration under Section 564(b)(1) of the Act, 21 U.S.C. section 360bbb-3(b)(1), unless the authorization is terminated or revoked.     Resp Syncytial Virus by PCR NEGATIVE NEGATIVE Final    Comment: (NOTE) Fact Sheet for Patients: EntrepreneurPulse.com.au  Fact Sheet for Healthcare Providers: IncredibleEmployment.be  This test is not yet approved or cleared by the [place] FDA and has been authorized for detection and/or diagnosis of SARS-CoV-2 by FDA under an Emergency Use Authorization (EUA). This EUA will remain in effect (meaning this test can be used) for the duration of the COVID-19 declaration under Section 564(b)(1) of the Act, 21 U.S.C. section 360bbb-3(b)(1), unless the authorization is terminated or revoked.  Performed at [facility], [place],  [place] [address]   Culture, blood (Routine X 2) w Reflex to ID Panel     Status: None (Preliminary result)   Collection Time: 05/26/22  2:48 PM   Specimen: BLOOD RIGHT HAND  Result Value Ref Range Status   Specimen Description BLOOD RIGHT HAND  Final   Special Requests   Final    BOTTLES DRAWN AEROBIC AND ANAEROBIC Blood Culture adequate volume   Culture   Final    NO GROWTH 2 DAYS Performed at [facility], [place] [address] [place], [place] [address]    Report Status PENDING  Incomplete  Culture, blood (Routine X 2) w Reflex to ID Panel     Status: None (Preliminary result)   Collection Time: 05/26/22  2:48 PM   Specimen: BLOOD RIGHT WRIST  Result Value Ref Range Status   Specimen Description BLOOD RIGHT WRIST  Final   Special Requests   Final    BOTTLES DRAWN AEROBIC AND ANAEROBIC Blood Culture adequate volume   Culture   Final    NO GROWTH 2 DAYS Performed at [facility], [place] [address] [place], [place] [address]    Report Status PENDING  Incomplete  Respiratory (~20 pathogens) panel by PCR     Status: None   Collection Time: 05/26/22  4:59 PM   Specimen: Nasopharyngeal Swab; Respiratory  Result Value Ref Range Status   Adenovirus NOT DETECTED NOT DETECTED Final   Coronavirus 229E NOT DETECTED NOT DETECTED Final    Comment: (NOTE) The Coronavirus on the Respiratory Panel, DOES NOT test for the novel  Coronavirus (2019 nCoV)    Coronavirus HKU1 NOT DETECTED NOT DETECTED Final   Coronavirus NL63 NOT DETECTED NOT DETECTED Final   Coronavirus OC43 NOT DETECTED NOT DETECTED Final   Metapneumovirus NOT DETECTED NOT DETECTED Final   Rhinovirus / Enterovirus NOT DETECTED NOT DETECTED Final   Influenza A NOT DETECTED NOT DETECTED Final   Influenza B NOT DETECTED NOT DETECTED Final   Parainfluenza Virus 1 NOT DETECTED NOT DETECTED Final   Parainfluenza Virus 2 NOT DETECTED NOT DETECTED Final   Parainfluenza Virus 3 NOT DETECTED NOT DETECTED Final    Parainfluenza Virus 4 NOT DETECTED NOT DETECTED Final   Respiratory Syncytial Virus NOT DETECTED NOT DETECTED Final   Bordetella pertussis NOT DETECTED NOT DETECTED Final   Bordetella Parapertussis NOT DETECTED NOT DETECTED Final   Chlamydophila pneumoniae NOT DETECTED NOT DETECTED Final   Mycoplasma pneumoniae NOT DETECTED NOT DETECTED Final    Comment: Performed at [facility], [place]. [address] [place], [place] [address]  Resp panel by RT-PCR (RSV, Flu A&B, Covid) Anterior Nasal Swab     Status: None   Collection Time: 05/26/22  4:59 PM   Specimen: Anterior Nasal Swab  Result Value Ref Range Status   SARS Coronavirus 2 by RT PCR NEGATIVE NEGATIVE Final    Comment: (NOTE) SARS-CoV-2 target nucleic acids are NOT DETECTED.  The SARS-CoV-2 RNA is generally detectable in upper respiratory specimens during the acute phase of infection. The lowest concentration of SARS-CoV-2 viral copies this assay can detect is 138 copies/mL. A negative result does not preclude SARS-Cov-2 infection and should not be used as the sole basis for treatment or other patient management decisions. A negative result may occur with  improper specimen collection/handling, submission of specimen other than nasopharyngeal swab, presence of viral mutation(s) within the areas targeted by this assay, and inadequate number of viral copies(<138 copies/mL). A negative result must be  combined with clinical observations, patient history, and epidemiological information. The expected result is Negative.  Fact Sheet for Patients:  EntrepreneurPulse.com.au  Fact Sheet for Healthcare Providers:  IncredibleEmployment.be  This test is no t yet approved or cleared by the [place] FDA and  has been authorized for detection and/or diagnosis of SARS-CoV-2 by FDA under an Emergency Use Authorization (EUA). This EUA will remain  in effect (meaning this test can be used) for the duration  of the COVID-19 declaration under Section 564(b)(1) of the Act, 21 U.S.C.section 360bbb-3(b)(1), unless the authorization is terminated  or revoked sooner.       Influenza A by PCR NEGATIVE NEGATIVE Final   Influenza B by PCR NEGATIVE NEGATIVE Final    Comment: (NOTE) The Xpert Xpress SARS-CoV-2/FLU/RSV plus assay is intended as an aid in the diagnosis of influenza from Nasopharyngeal swab specimens and should not be used as a sole basis for treatment. Nasal washings and aspirates are unacceptable for Xpert Xpress SARS-CoV-2/FLU/RSV testing.  Fact Sheet for Patients: EntrepreneurPulse.com.au  Fact Sheet for Healthcare Providers: IncredibleEmployment.be  This test is not yet approved or cleared by the [place] FDA and has been authorized for detection and/or diagnosis of SARS-CoV-2 by FDA under an Emergency Use Authorization (EUA). This EUA will remain in effect (meaning this test can be used) for the duration of the COVID-19 declaration under Section 564(b)(1) of the Act, 21 U.S.C. section 360bbb-3(b)(1), unless the authorization is terminated or revoked.     Resp Syncytial Virus by PCR NEGATIVE NEGATIVE Final    Comment: (NOTE) Fact Sheet for Patients: EntrepreneurPulse.com.au  Fact Sheet for Healthcare Providers: IncredibleEmployment.be  This test is not yet approved or cleared by the [place] FDA and has been authorized for detection and/or diagnosis of SARS-CoV-2 by FDA under an Emergency Use Authorization (EUA). This EUA will remain in effect (meaning this test can be used) for the duration of the COVID-19 declaration under Section 564(b)(1) of the Act, 21 U.S.C. section 360bbb-3(b)(1), unless the authorization is terminated or revoked.  Performed at [facility], [place] [address], [place], [place] [address]      Radiology Studies: No results found.   Marzetta Board, MD,  PhD [facility]  Between 7 am - 7 pm I am available, please contact me via Amion (for emergencies) or Securechat (non urgent messages)  Between 7 pm - 7 am I am not available, please contact night coverage MD/APP via Amion

## 2022-05-29 ENCOUNTER — Inpatient Hospital Stay (HOSPITAL_COMMUNITY): Payer: Medicare Other

## 2022-05-29 DIAGNOSIS — I2510 Atherosclerotic heart disease of native coronary artery without angina pectoris: Secondary | ICD-10-CM | POA: Diagnosis not present

## 2022-05-29 DIAGNOSIS — D500 Iron deficiency anemia secondary to blood loss (chronic): Secondary | ICD-10-CM | POA: Diagnosis not present

## 2022-05-29 DIAGNOSIS — E039 Hypothyroidism, unspecified: Secondary | ICD-10-CM | POA: Diagnosis not present

## 2022-05-29 DIAGNOSIS — D649 Anemia, unspecified: Secondary | ICD-10-CM | POA: Diagnosis not present

## 2022-05-29 LAB — HEPARIN LEVEL (UNFRACTIONATED)
Heparin Unfractionated: <0.1 IU/mL — ABNORMAL LOW (ref 0.30–0.70)
Heparin Unfractionated: 0.28 IU/mL — ABNORMAL LOW (ref 0.30–0.70)
Heparin Unfractionated: 0.21 IU/mL — ABNORMAL LOW (ref 0.30–0.70)

## 2022-05-29 LAB — BASIC METABOLIC PANEL
Anion gap: 7 (ref 5–15)
BUN: 15 mg/dL (ref 8–23)
CO2: 22 mmol/L (ref 22–32)
Calcium: 8.4 mg/dL — ABNORMAL LOW (ref 8.9–10.3)
Chloride: 107 mmol/L (ref 98–111)
Creatinine, Ser: 0.95 mg/dL (ref 0.61–1.24)
GFR, Estimated: >60 mL/min (ref >60)
Glucose, Bld: 157 mg/dL — ABNORMAL HIGH (ref 70–99)
Potassium: 3.8 mmol/L (ref 3.5–5.1)
Sodium: 136 mmol/L (ref 135–145)

## 2022-05-29 LAB — CBC
HCT: 22.8 % — ABNORMAL LOW (ref 39.0–52.0)
Hemoglobin: 7.3 g/dL — ABNORMAL LOW (ref 13.0–17.0)
MCH: 30.5 pg (ref 26.0–34.0)
MCHC: 32 g/dL (ref 30.0–36.0)
MCV: 95.4 fL (ref 80.0–100.0)
Platelets: 192 10*3/uL (ref 150–400)
RBC: 2.39 MIL/uL — ABNORMAL LOW (ref 4.22–5.81)
RDW: 17.2 % — ABNORMAL HIGH (ref 11.5–15.5)
WBC: 4.9 10*3/uL (ref 4.0–10.5)
nRBC: 0 % (ref 0.0–0.2)

## 2022-05-29 LAB — GLUCOSE, CAPILLARY
Glucose-Capillary: 126 mg/dL — ABNORMAL HIGH (ref 70–99)
Glucose-Capillary: 192 mg/dL — ABNORMAL HIGH (ref 70–99)
Glucose-Capillary: 96 mg/dL (ref 70–99)
Glucose-Capillary: 98 mg/dL (ref 70–99)

## 2022-05-29 LAB — APTT: aPTT: 23 seconds — ABNORMAL LOW (ref 24–36)

## 2022-05-29 LAB — MAGNESIUM: Magnesium: 1.6 mg/dL — ABNORMAL LOW (ref 1.7–2.4)

## 2022-05-29 MED ORDER — SODIUM CHLORIDE 0.9 % IV SOLN
250.0000 mg | Freq: Once | INTRAVENOUS | Status: AC
  Administered 2022-05-29: 250 mg via INTRAVENOUS
  Filled 2022-05-29: qty 20

## 2022-05-29 MED ORDER — BARIUM SULFATE 0.1 % PO SUSP
450.0000 mL | Freq: Once | ORAL | Status: AC
  Administered 2022-05-29: 450 mL via ORAL

## 2022-05-29 MED ORDER — MAGNESIUM SULFATE 2 GM/50ML IV SOLN
2.0000 g | Freq: Once | INTRAVENOUS | Status: AC
  Administered 2022-05-29: 2 g via INTRAVENOUS
  Filled 2022-05-29: qty 50

## 2022-05-29 MED ORDER — SENNOSIDES-DOCUSATE SODIUM 8.6-50 MG PO TABS
2.0000 | ORAL_TABLET | Freq: Every day | ORAL | Status: DC
  Filled 2022-05-29: qty 2

## 2022-05-29 MED ORDER — POLYETHYLENE GLYCOL 3350 17 G PO PACK
17.0000 g | PACK | Freq: Every day | ORAL | Status: DC
  Administered 2022-05-29 – 2022-05-30 (×2): 17 g via ORAL
  Filled 2022-05-29 (×2): qty 1

## 2022-05-29 MED ORDER — IOHEXOL 350 MG/ML SOLN
100.0000 mL | Freq: Once | INTRAVENOUS | Status: AC | PRN
  Administered 2022-05-29: 100 mL via INTRAVENOUS

## 2022-05-29 NOTE — Progress Notes (Signed)
Progress Note  Primary GI: Dr. Loistine Simas at [facility] in [place].   Scheduled for upcoming colonoscopy 06/10/22 w Dr Andrey Farmer    Subjective  Chief Complaint:HGB 6, melenic stools  [name], wife is at bedside.  No AB pain, no nausea or vomiting.  He is eating well.  He is walking the halls.  Denies any further fever or chills. 2 doses of miralax, small brown BM last night.     Objective   Vital signs in last 24 hours: Temp:  [98.1 F (36.7 C)-98.6 F (37 C)] 98.6 F (37 C) (01/04 0757) Pulse Rate:  [59-62] 60 (01/04 0852) Resp:  [17] 17 (01/04 0757) BP: (117-142)/(46-65) 142/65 (01/04 0757) SpO2:  [96 %-99 %] 99 % (01/04 0852) Last BM Date : 05/28/22 Last BM recorded by nurses in past 5 days No data recorded  General:   male in no acute distress  Heart:  Regular rate and rhythm; no murmurs Pulm: Clear anteriorly; no wheezing Abdomen:  Soft, Obese AB, Active bowel sounds. No tenderness . Without guarding and Without rebound, No organomegaly appreciated. Extremities:  without  edema. Neurologic:  Alert and  oriented x4;  No focal deficits.  Psych:  Cooperative. Normal mood and affect.  Intake/Output from previous day: 01/03 0701 - 01/04 0700 In: 1682.3 [P.O.:300; I.V.:1382.3] Out: -  Intake/Output this shift: Total I/O In: 360 [P.O.:360] Out: -   Studies/Results: No results found.  Lab Results: Recent Labs    05/27/22 0334 05/27/22 1730 05/28/22 0224 05/28/22 1555 05/29/22 0302  WBC 5.4  --  5.1  --  4.9  HGB 7.3*   < > 7.0* 7.6* 7.3*  HCT 22.9*   < > 21.3* 22.9* 22.8*  PLT 165  --  172  --  192   < > = values in this interval not displayed.   BMET Recent Labs    05/28/22 0224 05/29/22 0302  NA 136 136  K 4.5 3.8  CL 105 107  CO2 23 22  GLUCOSE 166* 157*  BUN 25* 15  CREATININE 1.30* 0.95  CALCIUM 8.4* 8.4*   LFT No results for input(s): "PROT", "ALBUMIN", "AST", "ALT", "ALKPHOS", "BILITOT", "BILIDIR", "IBILI" in the last 72  hours. PT/INR No results for input(s): "LABPROT", "INR" in the last 72 hours.   Scheduled Meds:  amoxicillin-clavulanate  1 tablet Oral Q12H   diltiazem  120 mg Oral Daily   gabapentin  300 mg Oral QHS   insulin aspart  0-9 Units Subcutaneous TID WC   leflunomide  10 mg Oral Daily   nebivolol  10 mg Oral Daily   pantoprazole  40 mg Oral Daily   Pirfenidone  267 mg Oral TID WC   pravastatin  20 mg Oral QPM   sodium chloride flush  3 mL Intravenous Q12H   thyroid  90 mg Oral BH-q7a   Continuous Infusions:  heparin 1,400 Units/hr (05/29/22 0420)   lactated ringers 75 mL/hr at 05/29/22 0420      Patient profile:   82 year old male with history of A-fib on Xarelto, SSS status post PM, ASD status PCI, OSA on CPAP with OSA overlap, hypothyroidism, psoriatic arthritis, diabetes presents with melena.   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 Colonoscopy 12/31 showed diverticulosis and internal hemorrhoids.  VCE was swallowed on 12/31 but is not able to be uploaded until yesterday Capsule  study showed no pathology or anemia/bleeding however preoperative limiting. Will plan for CT enterography to assure no missing small bowel process Hemoglobin study this morning. Continue to monitor hemoglobin, will give IV iron  If negative CTE can continue supportive care outpatient with hematology referral.  Patient is interested in transferring care to our office, will need outpatient follow up with our practice.  Further recommendations per Dr. Tarri Glenn.  History of constipation Small brown BM Continue miralax daily  Fever 101 yesterday, Tmax 98.9 today, was after completing third PRBC high probability blood reaction. 05/28/2022 WBC 5.1  Respiratory pathology panel negative, blood cultures negative day 2 Empiric Vanco, metronidazole and cefepime in place.  Azotemia BUN  as high as 49 improved  Will continue to follow along with you.  Principal Problem:   GI bleed Active Problems:   Complete heart block (HCC)   Hypertension   Atrial fibrillation (HCC)   Hyperlipidemia, unspecified   Hypothyroidism   Hypothyroidism, unspecified   Rheumatoid arthritis ([place])   Coronary artery disease   OSA (obstructive sleep apnea)   ILD (interstitial lung disease) ([place])   Benign prostatic hyperplasia with urinary frequency   Symptomatic anemia   Dysphagia   Heme positive stool   Anticoagulant long-term use    LOS: 5 days   Vladimir Crofts  05/29/2022, 11:20 AM

## 2022-05-29 NOTE — Progress Notes (Signed)
[facility] for IV Heparin Indication: atrial fibrillation  Allergies  Allergen Reactions   Ace Inhibitors Swelling    Tongue swelling, angioedema   Lisinopril     Other reaction(s): Lip swelling, O/E - lip swelling   Celecoxib Rash    Skin rash     Patient Measurements: Height: 5' 7" (170.2 cm) Weight: 78 kg (172 lb) IBW/kg (Calculated) : 66.1 Heparin Dosing Weight: 78 kg  Vital Signs: Temp: 98.6 F (37 C) (01/04 0757) Temp Source: Oral (01/04 0757) BP: 142/65 (01/04 0757) Pulse Rate: 60 (01/04 0852)  Labs: Recent Labs    05/27/22 0334 05/27/22 1730 05/28/22 0224 05/28/22 1555 05/29/22 0302 05/29/22 1147  HGB 7.3*   < > 7.0* 7.6* 7.3*  --   HCT 22.9*   < > 21.3* 22.9* 22.8*  --   PLT 165  --  172  --  192  --   APTT  --   --   --   --  23*  --   HEPARINUNFRC  --   --   --   --  <0.10* 0.28*  CREATININE  --   --  1.30*  --  0.95  --    < > = values in this interval not displayed.     Estimated Creatinine Clearance: 56 mL/min (by C-G formula based on SCr of 0.95 mg/dL).  Medications:  Scheduled:   amoxicillin-clavulanate  1 tablet Oral Q12H   diltiazem  120 mg Oral Daily   gabapentin  300 mg Oral QHS   insulin aspart  0-9 Units Subcutaneous TID WC   leflunomide  10 mg Oral Daily   nebivolol  10 mg Oral Daily   pantoprazole  40 mg Oral Daily   Pirfenidone  267 mg Oral TID WC   polyethylene glycol  17 g Oral Daily   pravastatin  20 mg Oral QPM   senna-docusate  2 tablet Oral QHS   sodium chloride flush  3 mL Intravenous Q12H   thyroid  90 mg Oral BH-q7a   Infusions:   ferric gluconate (FERRLECIT) IVPB 250 mg (05/29/22 1253)   heparin 1,400 Units/hr (05/29/22 0420)   lactated ringers Stopped (05/29/22 1247)   magnesium sulfate bolus IVPB      Assessment: 82 years of age male admitted with melena on Xarelto prior to admission for atrial fibrillation. Normal upper endoscopy 12/30. Colonoscopy 12/31 with  diverticulosis and non-bleeding internal hemorrhoids. Patient received dose of Xarelto 12/31- last dose. Patient underwent capsule endoscopy which was negative for active bleeding. Pharmacy consulted to start IV heparin therapy for anticoagulation and monitor.   Heparin level 0.28 (subtherapeutic) while on heparin 1400 units/hr. Hgb low but stable at 7.3 - no signs of active bleeding.  If CTE negative, can resume Xarelto  Goal of Therapy:  Heparin level 0.3-0.7 units/ml Monitor platelets by anticoagulation protocol: Yes   Plan:  Increase heparin to 1500 units/hr Check 8hr heparin level Daily heparin level, and CBC while on therapy.  F/u results of CTE and ability to transition back to [place], PharmD Clinical Pharmacist 05/29/2022,12:59 PM

## 2022-05-29 NOTE — Progress Notes (Signed)
ANTICOAGULATION CONSULT NOTE  Pharmacy Consult for IV Heparin Indication: atrial fibrillation  Allergies  Allergen Reactions   Ace Inhibitors Swelling    Tongue swelling, angioedema   Lisinopril     Other reaction(s): Lip swelling, O/E - lip swelling   Celecoxib Rash    Skin rash     Patient Measurements: Height: _0  (170.2 cm) Weight: 78 kg (172 lb) IBW/kg (Calculated) : 66.1 Heparin Dosing Weight: 78 kg  Vital Signs: Temp: 97.5 F (36.4 C) (01/04 1621) Temp Source: Oral (01/04 1621) BP: 155/74 (01/04 1621) Pulse Rate: 67 (01/04 1621)  Labs: Recent Labs    05/27/22 0334 05/27/22 1730 05/28/22 0224 05/28/22 1555 05/29/22 0302 05/29/22 1147 05/29/22 2050  HGB 7.3*   < > 7.0* 7.6* 7.3*  --   --   HCT 22.9*   < > 21.3* 22.9* 22.8*  --   --   PLT 165  --  172  --  192  --   --   APTT  --   --   --   --  23*  --   --   HEPARINUNFRC  --   --   --   --  <0.10* 0.28* 0.21*  CREATININE  --   --  1.30*  --  0.95  --   --    < > = values in this interval not displayed.     Estimated Creatinine Clearance: 56 mL/min (by C-G formula based on SCr of 0.95 mg/dL).  Medications:  Scheduled:   amoxicillin-clavulanate  1 tablet Oral Q12H   barium  450 mL Oral Once   diltiazem  120 mg Oral Daily   gabapentin  300 mg Oral QHS   insulin aspart  0-9 Units Subcutaneous TID WC   leflunomide  10 mg Oral Daily   nebivolol  10 mg Oral Daily   pantoprazole  40 mg Oral Daily   Pirfenidone  267 mg Oral TID WC   polyethylene glycol  17 g Oral Daily   pravastatin  20 mg Oral QPM   senna-docusate  2 tablet Oral QHS   sodium chloride flush  3 mL Intravenous Q12H   thyroid  90 mg Oral BH-q7a   Infusions:   heparin Stopped (05/29/22 1900)   lactated ringers Stopped (05/29/22 1900)    Assessment: 82 years of age male admitted with melena on Xarelto prior to admission for atrial fibrillation. Normal upper endoscopy 12/30. Colonoscopy 12/31 with diverticulosis and non-bleeding  internal hemorrhoids. Patient received dose of Xarelto 12/31- last dose. Patient underwent capsule endoscopy which was negative for active bleeding. Pharmacy consulted to start IV heparin therapy for anticoagulation and monitor.   Heparin level subtherapeutic at 0.21. Heparin drip was held ~1900 for CTE and heparin level drawn ~2050 which could explain low level. No changes for now and follow up 8 hour level vs change to Xarelto in AM.   Goal of Therapy:  Heparin level 0.3-0.7 units/ml Monitor platelets by anticoagulation protocol: Yes   Plan:  Continue heparin at 1500 units/hr Check 8hr heparin level Daily heparin level, and CBC while on therapy.  F/u results of CTE and ability to transition back to Xarelto (not yet resulted at this time)  Dimple Nanas, PharmD, BCPS 05/29/2022 9:51 PM

## 2022-05-29 NOTE — Progress Notes (Signed)
ANTICOAGULATION CONSULT NOTE - Follow Up Consult  Pharmacy Consult for heparin Indication: atrial fibrillation  Labs: Recent Labs    05/27/22 0334 05/27/22 1730 05/28/22 0224 05/28/22 1555 05/29/22 0302  HGB 7.3*   < > 7.0* 7.6* 7.3*  HCT 22.9*   < > 21.3* 22.9* 22.8*  PLT 165  --  172  --  192  HEPARINUNFRC  --   --   --   --  <0.10*  CREATININE  --   --  1.30*  --   --    < > = values in this interval not displayed.    Assessment: 82yo male subtherapeutic on heparin with initial dosing while Xarelto held; no infusion issues or signs of bleeding per RN.  Goal of Therapy:  Heparin level 0.3-0.7 units/ml   Plan:  Will increase heparin infusion by 4 units/kg/hr to 1400 units/hr and check level in 8 hours.    Wynona Neat, PharmD, BCPS  05/29/2022,4:06 AM

## 2022-05-29 NOTE — Progress Notes (Signed)
PROGRESS NOTE  Cameron [name] XKG:[id number] DOB: 23-Sep-1940 DOA: 05/23/2022 PCP: [name], MD   LOS: 5 days   Brief Narrative / Interim history: Cameron [name] is a 82 y.o. male with a history of psoriatic arthritis, rheumatoid arthritis, OSA, hypothyroidism, hyperlipidemia, CAD, atrial fibrillation, CKD stage IIIb, complete heart block s/p PPM, ILD, hypertension, BPH s/p TURP and diabetes. Patient presented secondary to weakness and lightheadedness with a history of black/dark stools and found to have focal occult positive stool and anemia requiring blood transfusion. [facility] consulted and performed upper endoscopy, without identification of bleeding source.   Subjective / 24h Interval events: No melena, no blood in his stool. Overall feels well. Walked in the hallway earlier  Assesement and Plan: Principal Problem:   GI bleed Active Problems:   Complete heart block ([place])   Hypertension   Atrial fibrillation ([place])   Hyperlipidemia, unspecified   Hypothyroidism   Hypothyroidism, unspecified   Rheumatoid arthritis ([place])   Coronary artery disease   OSA (obstructive sleep apnea)   ILD (interstitial lung disease) ([facility])   Benign prostatic hyperplasia with urinary frequency   Symptomatic anemia   Dysphagia   Heme positive stool   Anticoagulant long-term use  Principal problem Melena -Black/dark stools. Fecal occult test positive in the ED. Associated anemia. [facility] consulted and performed upper endoscopy on 12/30 which was significant for benign appearing esophageal stenosis, normal stomach and acquired duodenal stenosis. Colonoscopy performed on 12/31 was significant for diverticulosis and non-bleeding internal hemorrhoids. Capsule endoscopy negative as well. D/w GI, CT entero pending.  Active problems  Acute blood loss anemia -Baseline hemoglobin is about 11-12. Hemoglobin of 6.0 on admission requiring transfusion of 2 units of PRBC. Post-transfusion hemoglobin of  7.9 with downward drift to 7.4. then rebound hemoglobin up to 8.5-8.7.  Hemoglobin drifted down to 7.1 on 1/1, received another unit of packed red blood cells but developed fever during transfusion.  Of note, he was febrile the night before so he was not believed like he was related to the transfusion itself.  Repeat H&H overall stable disease yesterday, 7.3 this morning.  Clinically without further bleeding  Fever -Unclear etiology, 1/1, currently afebrile. Patient with symptoms of rhinorrhea. No other significant symptoms. Patient is concerned he may have a sinus infection. COVID-19, influenza, RSV and respiratory virus panel are negative for source. Blood cultures obtained on 1/1 and without growth.  He was placed on empiric antibiotics, but with negative cultures narrowed down to Augmentin for presumed sinus infection, given elevated procalcitonin.   Psoriatic arthritis, Rheumatoid arthritis -Continue home leflunomide   OSA -Continue CPAP   Hypothyroidism -Continue thyroid 90 mg daily   Hyperlipidemia -Continue pravastatin   Esophageal stenosis -Benign appearing. Dilated by GI on 12/30.   CAD  -History of PCI with DES in 2019. Patient is on aspirin and Xarelto as an outpatient. Aspirin and Xarelto held. GI recommending to restart Xarelto on 12/31. Now with recurrent acute anemia -Discontinue Xarelto, presumption complicated by blood loss anemia, hemoglobin trending down   Atrial fibrillation, unspecified -Patient is on diltiazem, nebivolol and Xarelto as an outpatient. Continue diltiazem and nebivolol.  Given stability of his hemoglobin, no frank bleeding, patient was started on heparin without bolus.  Continue for now.  If CT Antero is negative, may transition to Xarelto   Primary hypertension - Patient is on diltiazem, nebivolol, hydrochlorothiazide and telmisartan as an outpatient.   CKD stage IIIb -Recent baseline creatinine is about 1.3. Creatinine of 1.22 on  admission and currently at  baseline around 1.3.  Continue to monitor   Complete heart block - S/p PPM   ILD - Noted. Patient follows with pulmonology. Continue Pirfenidone   BPH -Noted. History of TURP.   Diabetes mellitus, type 2 - Slightly uncontrolled with hyperglycemia but acceptable in his age group.  A1c 8.1.  On sliding scale while here.  CBGs acceptable today  CBG (last 3)  Recent Labs    05/28/22 1638 05/28/22 1953 05/29/22 0752  GLUCAP 162* 110* 126*    Scheduled Meds:  amoxicillin-clavulanate  1 tablet Oral Q12H   diltiazem  120 mg Oral Daily   gabapentin  300 mg Oral QHS   insulin aspart  0-9 Units Subcutaneous TID WC   leflunomide  10 mg Oral Daily   nebivolol  10 mg Oral Daily   pantoprazole  40 mg Oral Daily   Pirfenidone  267 mg Oral TID WC   polyethylene glycol  17 g Oral Daily   pravastatin  20 mg Oral QPM   senna-docusate  2 tablet Oral QHS   sodium chloride flush  3 mL Intravenous Q12H   thyroid  90 mg Oral BH-q7a   Continuous Infusions:  ferric gluconate (FERRLECIT) IVPB     heparin 1,400 Units/hr (05/29/22 0420)   lactated ringers 75 mL/hr at 05/29/22 0420   magnesium sulfate bolus IVPB     PRN Meds:.acetaminophen **OR** acetaminophen  Current Outpatient Medications  Medication Instructions   acetaminophen (TYLENOL) 500 mg, Oral, Every 6 hours PRN   allopurinol (ZYLOPRIM) 300 mg, Oral, Daily   aspirin EC 81 mg, Oral, Daily   clobetasol (OLUX) 0.05 % topical foam 1 Application, Topical, 2 times daily, Scalp,ears and elbows   diltiazem (CARDIZEM CD) 120 mg, Oral, Daily, Please schedule appointment for further refills   docusate sodium (COLACE) 100 mg, Oral, 2 times daily   empagliflozin (JARDIANCE) 10 mg, Oral, Daily   gabapentin (NEURONTIN) 300 mg, Oral, Daily at bedtime   glimepiride (AMARYL) 2 mg, Oral, Every morning   hydrochlorothiazide (HYDRODIURIL) 12.5 mg, Oral, Daily PRN   leflunomide (ARAVA) 10 mg, Oral, Daily   nebivolol (BYSTOLIC) 5 mg, Oral, Daily    nitroGLYCERIN (NITROSTAT) 0.4 mg, Sublingual, Every 5 min PRN   Omega-3 Fatty Acids (OMEGA-3 FISH OIL PO) 1 capsule, Oral, Daily   pantoprazole (PROTONIX) 40 mg, Oral, Daily   Pirfenidone (ESBRIET) 534 mg, Oral, 3 times daily with meals, **NOTE LOW DOSE due to kidney function**   pravastatin (PRAVACHOL) 20 mg, Oral, Every evening   rivaroxaban (XARELTO) 20 MG TABS tablet TAKE 1 TABLET DAILY WITH SUPPER   telmisartan (MICARDIS) 80 mg, Oral, Daily   terbinafine (LAMISIL) 1 % cream Apply twice daily to buttocks and left thigh rash until clear   thyroid (ARMOUR) 90 mg, Oral, BH-each morning   traZODone (DESYREL) 50 mg, Oral, At bedtime PRN   triamcinolone ointment (KENALOG) 0.1 % Apply twice daily to arms up to 2 weeks as needed. Avoid applying to face, groin, and axilla.   Trospium Chloride 60 MG CP24 TAKE 1 CAPSULE BY MOUTH DAILY   VTAMA 1 % CREA 1 application , Apply externally, Daily    Diet Orders (From admission, onward)     Start     Ordered   05/25/22 1157  Diet Heart Room service appropriate? Yes; Fluid consistency: Thin  Diet effective now       Question Answer Comment  Room service appropriate? Yes   Fluid consistency: Thin  05/25/22 1156            DVT prophylaxis: SCDs Start: 05/23/22 1301   Lab Results  Component Value Date   PLT 192 05/29/2022      Code Status: Full Code  Family Communication: wife at bedside   Status is: Inpatient  Remains inpatient appropriate because: ongoing anemia   Level of care: Med-Surg  Consultants:  GI  Objective: Vitals:   05/28/22 1955 05/29/22 0438 05/29/22 0757 05/29/22 0852  BP: (!) 119/46 (!) 117/52 (!) 142/65   Pulse: 62 60 (!) 59 60  Resp:   17   Temp: 98.4 F (36.9 C) 98.1 F (36.7 C) 98.6 F (37 C)   TempSrc: Oral Oral Oral   SpO2: 99% 96% 98% 99%  Weight:      Height:        Intake/Output Summary (Last 24 hours) at 05/29/2022 1209 Last data filed at 05/29/2022 0800 Gross per 24 hour  Intake  1742.32 ml  Output --  Net 1742.32 ml    Wt Readings from Last 3 Encounters:  05/24/22 78 kg  05/06/22 78.7 kg  04/01/22 82.4 kg    Examination:  Constitutional: NAD Eyes: lids and conjunctivae normal, no scleral icterus ENMT: mmm Neck: normal, supple Respiratory: clear to auscultation bilaterally, no wheezing, no crackles. Normal respiratory effort.  Cardiovascular: Regular rate and rhythm, no murmurs / rubs / gallops. No LE edema. Abdomen: soft, no distention, no tenderness. Bowel sounds positive.   Data Reviewed: I have independently reviewed following labs and imaging studies  CBC Recent Labs  Lab 05/25/22 0703 05/26/22 0322 05/26/22 0834 05/27/22 0334 05/27/22 1730 05/28/22 0224 05/28/22 1555 05/29/22 0302  WBC 7.1 8.1  --  5.4  --  5.1  --  4.9  HGB 8.7* 7.1*   < > 7.3* 8.4* 7.0* 7.6* 7.3*  HCT 26.4* 21.5*   < > 22.9* 26.6* 21.3* 22.9* 22.8*  PLT 185 167  --  165  --  172  --  192  MCV 95.0 95.1  --  95.8  --  95.1  --  95.4  MCH 31.3 31.4  --  30.5  --  31.3  --  30.5  MCHC 33.0 33.0  --  31.9  --  32.9  --  32.0  RDW 19.5* 18.9*  --  18.5*  --  17.9*  --  17.2*   < > = values in this interval not displayed.     Recent Labs  Lab 05/23/22 1216 05/24/22 0631 05/25/22 0703 05/26/22 1448 05/27/22 0334 05/28/22 0224 05/29/22 0302  NA 139 140 138  --   --  136 136  K 4.6 4.0 4.4  --   --  4.5 3.8  CL 109 108 109  --   --  105 107  CO2 20* 23 25  --   --  23 22  GLUCOSE 138* 92 101*  --   --  166* 157*  BUN 48* 33* 24*  --   --  25* 15  CREATININE 1.22 1.09 1.04  --   --  1.30* 0.95  CALCIUM 8.7* 8.7* 9.0  --   --  8.4* 8.4*  AST  --  15  --   --   --   --   --   ALT  --  11  --   --   --   --   --   ALKPHOS  --  45  --   --   --   --   --  BILITOT  --  0.7  --   --   --   --   --   ALBUMIN  --  2.6*  --   --   --   --   --   MG  --   --   --   --   --   --  1.6*  PROCALCITON  --   --   --  <0.10 1.37 0.83  --       ------------------------------------------------------------------------------------------------------------------ No results for input(s): "CHOL", "HDL", "LDLCALC", "TRIG", "CHOLHDL", "LDLDIRECT" in the last 72 hours.  No results found for: "HGBA1C" ------------------------------------------------------------------------------------------------------------------ No results for input(s): "TSH", "T4TOTAL", "T3FREE", "THYROIDAB" in the last 72 hours.  Invalid input(s): "FREET3"  Cardiac Enzymes No results for input(s): "CKMB", "TROPONINI", "MYOGLOBIN" in the last 168 hours.  Invalid input(s): "CK" ------------------------------------------------------------------------------------------------------------------ No results found for: "BNP"  CBG: Recent Labs  Lab 05/28/22 0814 05/28/22 1201 05/28/22 1638 05/28/22 1953 05/29/22 0752  GLUCAP 139* 161* 162* 110* 126*     Recent Results (from the past 240 hour(s))  Resp panel by RT-PCR (RSV, Flu A&B, Covid) Anterior Nasal Swab     Status: None   Collection Time: 05/23/22  2:46 PM   Specimen: Anterior Nasal Swab  Result Value Ref Range Status   SARS Coronavirus 2 by RT PCR NEGATIVE NEGATIVE Final    Comment: (NOTE) SARS-CoV-2 target nucleic acids are NOT DETECTED.  The SARS-CoV-2 RNA is generally detectable in upper respiratory specimens during the acute phase of infection. The lowest concentration of SARS-CoV-2 viral copies this assay can detect is 138 copies/mL. A negative result does not preclude SARS-Cov-2 infection and should not be used as the sole basis for treatment or other patient management decisions. A negative result may occur with  improper specimen collection/handling, submission of specimen other than nasopharyngeal swab, presence of viral mutation(s) within the areas targeted by this assay, and inadequate number of viral copies(<138 copies/mL). A negative result must be combined with clinical  observations, patient history, and epidemiological information. The expected result is Negative.  Fact Sheet for Patients:  EntrepreneurPulse.com.au  Fact Sheet for Healthcare Providers:  IncredibleEmployment.be  This test is no t yet approved or cleared by the [place] FDA and  has been authorized for detection and/or diagnosis of SARS-CoV-2 by FDA under an Emergency Use Authorization (EUA). This EUA will remain  in effect (meaning this test can be used) for the duration of the COVID-19 declaration under Section 564(b)(1) of the Act, 21 U.S.C.section 360bbb-3(b)(1), unless the authorization is terminated  or revoked sooner.       Influenza A by PCR NEGATIVE NEGATIVE Final   Influenza B by PCR NEGATIVE NEGATIVE Final    Comment: (NOTE) The Xpert Xpress SARS-CoV-2/FLU/RSV plus assay is intended as an aid in the diagnosis of influenza from Nasopharyngeal swab specimens and should not be used as a sole basis for treatment. Nasal washings and aspirates are unacceptable for Xpert Xpress SARS-CoV-2/FLU/RSV testing.  Fact Sheet for Patients: EntrepreneurPulse.com.au  Fact Sheet for Healthcare Providers: IncredibleEmployment.be  This test is not yet approved or cleared by the [place] FDA and has been authorized for detection and/or diagnosis of SARS-CoV-2 by FDA under an Emergency Use Authorization (EUA). This EUA will remain in effect (meaning this test can be used) for the duration of the COVID-19 declaration under Section 564(b)(1) of the Act, 21 U.S.C. section 360bbb-3(b)(1), unless the authorization is terminated or revoked.     Resp Syncytial Virus by PCR  NEGATIVE NEGATIVE Final    Comment: (NOTE) Fact Sheet for Patients: EntrepreneurPulse.com.au  Fact Sheet for Healthcare Providers: IncredibleEmployment.be  This test is not yet approved or cleared by  the [place] FDA and has been authorized for detection and/or diagnosis of SARS-CoV-2 by FDA under an Emergency Use Authorization (EUA). This EUA will remain in effect (meaning this test can be used) for the duration of the COVID-19 declaration under Section 564(b)(1) of the Act, 21 U.S.C. section 360bbb-3(b)(1), unless the authorization is terminated or revoked.  Performed at [facility], [place] [address], [place], [place] [address]   Culture, blood (Routine X 2) w Reflex to ID Panel     Status: None (Preliminary result)   Collection Time: 05/26/22  2:48 PM   Specimen: BLOOD RIGHT HAND  Result Value Ref Range Status   Specimen Description BLOOD RIGHT HAND  Final   Special Requests   Final    BOTTLES DRAWN AEROBIC AND ANAEROBIC Blood Culture adequate volume   Culture   Final    NO GROWTH 3 DAYS Performed at [facility], [place] [address] [place], [place] [address]    Report Status PENDING  Incomplete  Culture, blood (Routine X 2) w Reflex to ID Panel     Status: None (Preliminary result)   Collection Time: 05/26/22  2:48 PM   Specimen: BLOOD RIGHT WRIST  Result Value Ref Range Status   Specimen Description BLOOD RIGHT WRIST  Final   Special Requests   Final    BOTTLES DRAWN AEROBIC AND ANAEROBIC Blood Culture adequate volume   Culture   Final    NO GROWTH 3 DAYS Performed at [facility], [place] [address] [place], [place] [address]    Report Status PENDING  Incomplete  Respiratory (~20 pathogens) panel by PCR     Status: None   Collection Time: 05/26/22  4:59 PM   Specimen: Nasopharyngeal Swab; Respiratory  Result Value Ref Range Status   Adenovirus NOT DETECTED NOT DETECTED Final   Coronavirus 229E NOT DETECTED NOT DETECTED Final    Comment: (NOTE) The Coronavirus on the Respiratory Panel, DOES NOT test for the novel  Coronavirus (2019 nCoV)    Coronavirus HKU1 NOT DETECTED NOT DETECTED Final   Coronavirus NL63 NOT DETECTED NOT DETECTED Final    Coronavirus OC43 NOT DETECTED NOT DETECTED Final   Metapneumovirus NOT DETECTED NOT DETECTED Final   Rhinovirus / Enterovirus NOT DETECTED NOT DETECTED Final   Influenza A NOT DETECTED NOT DETECTED Final   Influenza B NOT DETECTED NOT DETECTED Final   Parainfluenza Virus 1 NOT DETECTED NOT DETECTED Final   Parainfluenza Virus 2 NOT DETECTED NOT DETECTED Final   Parainfluenza Virus 3 NOT DETECTED NOT DETECTED Final   Parainfluenza Virus 4 NOT DETECTED NOT DETECTED Final   Respiratory Syncytial Virus NOT DETECTED NOT DETECTED Final   Bordetella pertussis NOT DETECTED NOT DETECTED Final   Bordetella Parapertussis NOT DETECTED NOT DETECTED Final   Chlamydophila pneumoniae NOT DETECTED NOT DETECTED Final   Mycoplasma pneumoniae NOT DETECTED NOT DETECTED Final    Comment: Performed at [facility], [place]. [address] [place], [place] [address]  Resp panel by RT-PCR (RSV, Flu A&B, Covid) Anterior Nasal Swab     Status: None   Collection Time: 05/26/22  4:59 PM   Specimen: Anterior Nasal Swab  Result Value Ref Range Status   SARS Coronavirus 2 by RT PCR NEGATIVE NEGATIVE Final    Comment: (NOTE) SARS-CoV-2  target nucleic acids are NOT DETECTED.  The SARS-CoV-2 RNA is generally detectable in upper respiratory specimens during the acute phase of infection. The lowest concentration of SARS-CoV-2 viral copies this assay can detect is 138 copies/mL. A negative result does not preclude SARS-Cov-2 infection and should not be used as the sole basis for treatment or other patient management decisions. A negative result may occur with  improper specimen collection/handling, submission of specimen other than nasopharyngeal swab, presence of viral mutation(s) within the areas targeted by this assay, and inadequate number of viral copies(<138 copies/mL). A negative result must be combined with clinical observations, patient history, and epidemiological information. The expected result is  Negative.  Fact Sheet for Patients:  EntrepreneurPulse.com.au  Fact Sheet for Healthcare Providers:  IncredibleEmployment.be  This test is no t yet approved or cleared by the [place] FDA and  has been authorized for detection and/or diagnosis of SARS-CoV-2 by FDA under an Emergency Use Authorization (EUA). This EUA will remain  in effect (meaning this test can be used) for the duration of the COVID-19 declaration under Section 564(b)(1) of the Act, 21 U.S.C.section 360bbb-3(b)(1), unless the authorization is terminated  or revoked sooner.       Influenza A by PCR NEGATIVE NEGATIVE Final   Influenza B by PCR NEGATIVE NEGATIVE Final    Comment: (NOTE) The Xpert Xpress SARS-CoV-2/FLU/RSV plus assay is intended as an aid in the diagnosis of influenza from Nasopharyngeal swab specimens and should not be used as a sole basis for treatment. Nasal washings and aspirates are unacceptable for Xpert Xpress SARS-CoV-2/FLU/RSV testing.  Fact Sheet for Patients: EntrepreneurPulse.com.au  Fact Sheet for Healthcare Providers: IncredibleEmployment.be  This test is not yet approved or cleared by the [place] FDA and has been authorized for detection and/or diagnosis of SARS-CoV-2 by FDA under an Emergency Use Authorization (EUA). This EUA will remain in effect (meaning this test can be used) for the duration of the COVID-19 declaration under Section 564(b)(1) of the Act, 21 U.S.C. section 360bbb-3(b)(1), unless the authorization is terminated or revoked.     Resp Syncytial Virus by PCR NEGATIVE NEGATIVE Final    Comment: (NOTE) Fact Sheet for Patients: EntrepreneurPulse.com.au  Fact Sheet for Healthcare Providers: IncredibleEmployment.be  This test is not yet approved or cleared by the [place] FDA and has been authorized for detection and/or diagnosis of  SARS-CoV-2 by FDA under an Emergency Use Authorization (EUA). This EUA will remain in effect (meaning this test can be used) for the duration of the COVID-19 declaration under Section 564(b)(1) of the Act, 21 U.S.C. section 360bbb-3(b)(1), unless the authorization is terminated or revoked.  Performed at [facility], [place] [address] [place], [place] [address]      Radiology Studies: No results found.   Marzetta Board, MD, PhD [facility]  Between 7 am - 7 pm I am available, please contact me via Amion (for emergencies) or Securechat (non urgent messages)  Between 7 pm - 7 am I am not available, please contact night coverage MD/APP via Amion

## 2022-05-30 ENCOUNTER — Other Ambulatory Visit (HOSPITAL_COMMUNITY): Payer: Self-pay

## 2022-05-30 DIAGNOSIS — K922 Gastrointestinal hemorrhage, unspecified: Secondary | ICD-10-CM | POA: Diagnosis not present

## 2022-05-30 DIAGNOSIS — D500 Iron deficiency anemia secondary to blood loss (chronic): Secondary | ICD-10-CM | POA: Diagnosis not present

## 2022-05-30 LAB — GLUCOSE, CAPILLARY
Glucose-Capillary: 138 mg/dL — ABNORMAL HIGH (ref 70–99)
Glucose-Capillary: 202 mg/dL — ABNORMAL HIGH (ref 70–99)

## 2022-05-30 LAB — CBC
HCT: 23.6 % — ABNORMAL LOW (ref 39.0–52.0)
Hemoglobin: 7.8 g/dL — ABNORMAL LOW (ref 13.0–17.0)
MCH: 30.7 pg (ref 26.0–34.0)
MCHC: 33.1 g/dL (ref 30.0–36.0)
MCV: 92.9 fL (ref 80.0–100.0)
Platelets: 193 10*3/uL (ref 150–400)
RBC: 2.54 MIL/uL — ABNORMAL LOW (ref 4.22–5.81)
RDW: 16.8 % — ABNORMAL HIGH (ref 11.5–15.5)
WBC: 5.8 10*3/uL (ref 4.0–10.5)
nRBC: 0 % (ref 0.0–0.2)

## 2022-05-30 LAB — MAGNESIUM: Magnesium: 1.9 mg/dL (ref 1.7–2.4)

## 2022-05-30 LAB — COMPREHENSIVE METABOLIC PANEL
ALT: 17 U/L (ref 0–44)
AST: 17 U/L (ref 15–41)
Albumin: 2.4 g/dL — ABNORMAL LOW (ref 3.5–5.0)
Alkaline Phosphatase: 62 U/L (ref 38–126)
Anion gap: 6 (ref 5–15)
BUN: 12 mg/dL (ref 8–23)
CO2: 23 mmol/L (ref 22–32)
Calcium: 8.5 mg/dL — ABNORMAL LOW (ref 8.9–10.3)
Chloride: 108 mmol/L (ref 98–111)
Creatinine, Ser: 0.95 mg/dL (ref 0.61–1.24)
GFR, Estimated: >60 mL/min (ref >60)
Glucose, Bld: 182 mg/dL — ABNORMAL HIGH (ref 70–99)
Potassium: 3.8 mmol/L (ref 3.5–5.1)
Sodium: 137 mmol/L (ref 135–145)
Total Bilirubin: 0.5 mg/dL (ref 0.3–1.2)
Total Protein: 5.1 g/dL — ABNORMAL LOW (ref 6.5–8.1)

## 2022-05-30 LAB — HEPARIN LEVEL (UNFRACTIONATED): Heparin Unfractionated: 0.4 IU/mL (ref 0.30–0.70)

## 2022-05-30 MED ORDER — APIXABAN 5 MG PO TABS
5.0000 mg | ORAL_TABLET | Freq: Two times a day (BID) | ORAL | 0 refills | Status: DC
  Filled 2022-05-30: qty 60, 30d supply, fill #0

## 2022-05-30 NOTE — Progress Notes (Signed)
Pt. Awaiting for transport, pt finished drinking contrast at 1645, called CT for a follow up.

## 2022-05-30 NOTE — Progress Notes (Signed)
Remote pacemaker transmission.   

## 2022-05-30 NOTE — Plan of Care (Signed)

## 2022-05-30 NOTE — Discharge Summary (Signed)
Physician Discharge Summary  Cameron [name] UDJ:[id number] DOB: 01-26-1941 DOA: 05/23/2022  PCP: [name], MD  Admit date: 05/23/2022 Discharge date: 05/30/2022  Admitted From: home Disposition:  home  Recommendations for Outpatient Follow-up:  Follow up with PCP in 1-2 weeks Please obtain BMP/CBC in one week  Home Health: none Equipment/Devices: none  Discharge Condition: stable CODE STATUS: Full code  HPI: Per admitting MD, Cameron [name] is a 82 y.o. male with medical history significant of psoriatic arthritis, rheumatoid arthritis, OSA, hypothyroidism, hyperlipidemia, CAD status post DES, atrial fibrillation, CKD 3B, complete heart block status post pacemaker, ILD, hypertension, BPH status post TURP, diabetes presenting with weakness and lightheadedness. Patient reports 2 weeks of generalized weakness.  Has also been experiencing lightheadedness on standing for the past week or so.  He reports dark/black stools for the past 5 to 7 days.  He is also been experiencing some intermittent chest tightness and shortness of breath.  He has not noticed any significant increase in chest pain or shortness of breath with exertion. He denies fevers, chills, shortness of breath, abdominal pain, constipation, diarrhea, nausea, vomiting.  Hospital Course / Discharge diagnoses: Principal Problem:   GI bleed Active Problems:   Complete heart block (HCC)   Hypertension   Atrial fibrillation (HCC)   Hyperlipidemia, unspecified   Hypothyroidism   Hypothyroidism, unspecified   Rheumatoid arthritis ([place])   Coronary artery disease   OSA (obstructive sleep apnea)   ILD (interstitial lung disease) (HCC)   Benign prostatic hyperplasia with urinary frequency   Symptomatic anemia   Dysphagia   Heme positive stool   Anticoagulant long-term use   Principal problem Melena, acute blood loss anemia-Black/dark stools. Fecal occult test positive in the ED. hemoglobin was 6.0 on admission,  required a total of 2 units of packed red blood cells.  He did require 1/3 unit later on upon hemoglobin drifting down, and appears to have developed fever during transfusion.  Associated anemia. [facility] consulted and performed upper endoscopy on 12/30 which was significant for benign appearing esophageal stenosis, normal stomach and acquired duodenal stenosis. Colonoscopy performed on 12/31 was significant for diverticulosis and non-bleeding internal hemorrhoids. Capsule endoscopy negative as well. D/w GI, underwent a CT enterography which was also negative for acute findings.  Eventually his bleeding has stopped and hemoglobin has remained stable.  Risk/benefits regarding anticoagulation were discussed with the patient and his wife, they favor to continue anticoagulation given risk of strokes.  He was carefully placed on heparin infusion, and was able to tolerate this for 2 days, with stability in his hemoglobin.  He will be transitioned from Xarelto to Eliquis per their preference, will need follow-up with GI as an outpatient.   Active problems  Fever -prior to and also during the transfusion.  Less likely to be a transfusion reaction given that it happened before also.  Cultures negative.  He has nonspecific URI which is likely the etiology.  Resolved  Psoriatic arthritis, Rheumatoid arthritis -Continue home leflunomide OSA -Continue CPAP Hypothyroidism -Continue thyroid 90 mg daily Hyperlipidemia -Continue pravastatin Esophageal stenosis -Benign appearing. Dilated by GI on 12/30. CAD  -History of PCI with DES in 2019. Patient is on aspirin and Xarelto as an outpatient.  Now transitioned to Eliquis Atrial fibrillation, unspecified -Patient is on diltiazem, nebivolol and Xarelto as an outpatient. Continue diltiazem and nebivolol.  Transition to Eliquis  Primary hypertension -continue home medications CKD stage IIIb -Recent baseline creatinine is about 1.3. Creatinine of 1.22 on admission  and  currently at baseline around 1.3.  Complete heart block - S/p PPM ILD - Noted. Patient follows with pulmonology. Continue Pirfenidone BPH -Noted. History of TURP. Diabetes mellitus, type 2 -continue home regimen  Sepsis ruled out   Discharge Instructions   Allergies as of 05/30/2022       Reactions   Ace Inhibitors Swelling   Tongue swelling, angioedema   Lisinopril    Other reaction(s): Lip swelling, O/E - lip swelling   Celecoxib Rash   Skin rash        Medication List     STOP taking these medications    hydrochlorothiazide 25 MG tablet Commonly known as: HYDRODIURIL   rivaroxaban 20 MG Tabs tablet Commonly known as: Xarelto       TAKE these medications    acetaminophen 500 MG tablet Commonly known as: TYLENOL Take 500 mg by mouth every 6 (six) hours as needed for moderate pain.   allopurinol 300 MG tablet Commonly known as: ZYLOPRIM Take 300 mg by mouth daily.   apixaban 5 MG Tabs tablet Commonly known as: Eliquis Take 1 tablet (5 mg total) by mouth 2 (two) times daily.   aspirin EC 81 MG tablet Take 1 tablet (81 mg total) by mouth daily.   clobetasol 0.05 % topical foam Commonly known as: OLUX Apply 1 Application topically 2 (two) times daily. Scalp,ears and elbows   diltiazem 120 MG 24 hr capsule Commonly known as: CARDIZEM CD Take 1 capsule (120 mg total) by mouth daily. Please schedule appointment for further refills   docusate sodium 100 MG capsule Commonly known as: COLACE Take 100 mg by mouth 2 (two) times daily.   gabapentin 300 MG capsule Commonly known as: NEURONTIN Take 300 mg by mouth at bedtime.   glimepiride 2 MG tablet Commonly known as: AMARYL Take 2 mg by mouth every morning.   Jardiance 10 MG Tabs tablet Generic drug: empagliflozin Take 10 mg by mouth daily.   leflunomide 10 MG tablet Commonly known as: ARAVA Take 10 mg by mouth daily.   nebivolol 5 MG tablet Commonly known as: BYSTOLIC Take 5 mg by mouth  daily.   nitroGLYCERIN 0.4 MG SL tablet Commonly known as: NITROSTAT Place 1 tablet (0.4 mg total) under the tongue every 5 (five) minutes as needed for chest pain.   OMEGA-3 FISH OIL PO Take 1 capsule by mouth daily.   pantoprazole 40 MG tablet Commonly known as: PROTONIX Take 40 mg by mouth daily.   Pirfenidone 267 MG Tabs Commonly known as: Esbriet Take 2 tablets (534 mg total) by mouth 3 (three) times daily with meals. **NOTE LOW DOSE due to kidney function**   pravastatin 20 MG tablet Commonly known as: PRAVACHOL Take 1 tablet (20 mg total) by mouth every evening.   telmisartan 80 MG tablet Commonly known as: MICARDIS Take 1 tablet (80 mg total) by mouth daily.   terbinafine 1 % cream Commonly known as: LAMISIL Apply twice daily to buttocks and left thigh rash until clear   thyroid 90 MG tablet Commonly known as: ARMOUR Take 90 mg by mouth every morning.   traZODone 50 MG tablet Commonly known as: DESYREL Take 50 mg by mouth at bedtime as needed.   triamcinolone ointment 0.1 % Commonly known as: KENALOG Apply twice daily to arms up to 2 weeks as needed. Avoid applying to face, groin, and axilla. What changed:  how much to take how to take this when to take this   Trospium Chloride  60 MG Cp24 TAKE 1 CAPSULE BY MOUTH DAILY   Vtama 1 % Crea Generic drug: Tapinarof APPLY 1 APPLICATION TOPICALLY DAILY        Follow-up Information     Lesly Rubenstein, MD Follow up.   Specialty: Gastroenterology Why: for GI follow up of bleeding. Contact information: [place] [address] [phone]                 Consultations: GI  Procedures/Studies:  CT ENTERO ABD/PELVIS W CONTAST  Result Date: 05/30/2022 CLINICAL DATA:  Anemia.  GI bleed. EXAM: CT ABDOMEN AND PELVIS WITH CONTRAST (ENTEROGRAPHY) TECHNIQUE: Multidetector CT of the abdomen and pelvis during bolus administration of intravenous contrast. Negative oral contrast was  given. RADIATION DOSE REDUCTION: This exam was performed according to the departmental dose-optimization program which includes automated exposure control, adjustment of the mA and/or kV according to patient size and/or use of iterative reconstruction technique. CONTRAST:  158m OMNIPAQUE IOHEXOL 350 MG/ML SOLN COMPARISON:  CT 01/24/2022 FINDINGS: Lower chest: Lung bases are clear. Trace bilateral pleural effusions. Hepatobiliary: No focal hepatic lesion. Postcholecystectomy. No biliary dilatation. Pancreas: Pancreas is normal. No ductal dilatation. No pancreatic inflammation. Spleen: Normal spleen Adrenals/urinary tract: Adrenal glands, kidneys, ureters and bladder without acute findings. Stomach/Bowel: 6,, duodenum small-bowel normal. No small dilatation or obstruction. No inflammation identified. Ascending, transverse descending colon normal. Non formed stool in the LEFT colon. Rectum normal. Vascular/Lymphatic: Abdominal aorta is normal caliber with atherosclerotic calcification. There is no retroperitoneal or periportal lymphadenopathy. No pelvic lymphadenopathy. Reproductive: TURP defect in the prostatic Other: No free fluid. Musculoskeletal: No aggressive osseous lesion. IMPRESSION: 1. No acute abdominopelvic findings. 2. Non formed stool in the LEFT colon. 3. Small bilateral pleural effusions. Electronically Signed   By: SSuzy BouchardM.D.   On: 05/30/2022 08:12   CUP PACEART REMOTE DEVICE CHECK  Result Date: 05/08/2022 Scheduled remote reviewed. Normal device function.  Next remote 91 days. MKathy Breach RN, CCDS, [employer]    Subjective: - no chest pain, shortness of breath, no abdominal pain, nausea or vomiting.   Discharge Exam: BP (!) 139/59 (BP Location: Right Arm)   Pulse (!) 59   Temp 98.2 F (36.8 C) (Oral)   Resp 17   Ht _0  (1.702 m)   Wt 78 kg   SpO2 96%   BMI 26.94 kg/m   General: Pt is alert, awake, not in acute distress Extremities: no edema, no  cyanosis    The results of significant diagnostics from this hospitalization (including imaging, microbiology, ancillary and laboratory) are listed below for reference.     Microbiology: Recent Results (from the past 240 hour(s))  Resp panel by RT-PCR (RSV, Flu A&B, Covid) Anterior Nasal Swab     Status: None   Collection Time: 05/23/22  2:46 PM   Specimen: Anterior Nasal Swab  Result Value Ref Range Status   SARS Coronavirus 2 by RT PCR NEGATIVE NEGATIVE Final    Comment: (NOTE) SARS-CoV-2 target nucleic acids are NOT DETECTED.  The SARS-CoV-2 RNA is generally detectable in upper respiratory specimens during the acute phase of infection. The lowest concentration of SARS-CoV-2 viral copies this assay can detect is 138 copies/mL. A negative result does not preclude SARS-Cov-2 infection and should not be used as the sole basis for treatment or other patient management decisions. A negative result may occur with  improper specimen collection/handling, submission of specimen other than nasopharyngeal swab, presence of viral mutation(s) within the areas targeted  by this assay, and inadequate number of viral copies(<138 copies/mL). A negative result must be combined with clinical observations, patient history, and epidemiological information. The expected result is Negative.  Fact Sheet for Patients:  EntrepreneurPulse.com.au  Fact Sheet for Healthcare Providers:  IncredibleEmployment.be  This test is no t yet approved or cleared by the [place] FDA and  has been authorized for detection and/or diagnosis of SARS-CoV-2 by FDA under an Emergency Use Authorization (EUA). This EUA will remain  in effect (meaning this test can be used) for the duration of the COVID-19 declaration under Section 564(b)(1) of the Act, 21 U.S.C.section 360bbb-3(b)(1), unless the authorization is terminated  or revoked sooner.       Influenza A by PCR NEGATIVE  NEGATIVE Final   Influenza B by PCR NEGATIVE NEGATIVE Final    Comment: (NOTE) The Xpert Xpress SARS-CoV-2/FLU/RSV plus assay is intended as an aid in the diagnosis of influenza from Nasopharyngeal swab specimens and should not be used as a sole basis for treatment. Nasal washings and aspirates are unacceptable for Xpert Xpress SARS-CoV-2/FLU/RSV testing.  Fact Sheet for Patients: EntrepreneurPulse.com.au  Fact Sheet for Healthcare Providers: IncredibleEmployment.be  This test is not yet approved or cleared by the [place] FDA and has been authorized for detection and/or diagnosis of SARS-CoV-2 by FDA under an Emergency Use Authorization (EUA). This EUA will remain in effect (meaning this test can be used) for the duration of the COVID-19 declaration under Section 564(b)(1) of the Act, 21 U.S.C. section 360bbb-3(b)(1), unless the authorization is terminated or revoked.     Resp Syncytial Virus by PCR NEGATIVE NEGATIVE Final    Comment: (NOTE) Fact Sheet for Patients: EntrepreneurPulse.com.au  Fact Sheet for Healthcare Providers: IncredibleEmployment.be  This test is not yet approved or cleared by the [place] FDA and has been authorized for detection and/or diagnosis of SARS-CoV-2 by FDA under an Emergency Use Authorization (EUA). This EUA will remain in effect (meaning this test can be used) for the duration of the COVID-19 declaration under Section 564(b)(1) of the Act, 21 U.S.C. section 360bbb-3(b)(1), unless the authorization is terminated or revoked.  Performed at [facility], [place] [address] [place], [place] [address]   Culture, blood (Routine X 2) w Reflex to ID Panel     Status: None (Preliminary result)   Collection Time: 05/26/22  2:48 PM   Specimen: BLOOD RIGHT HAND  Result Value Ref Range Status   Specimen Description BLOOD RIGHT HAND  Final   Special Requests   Final     BOTTLES DRAWN AEROBIC AND ANAEROBIC Blood Culture adequate volume   Culture   Final    NO GROWTH 3 DAYS Performed at [facility], [place] [address] [place], [place] [address]    Report Status PENDING  Incomplete  Culture, blood (Routine X 2) w Reflex to ID Panel     Status: None (Preliminary result)   Collection Time: 05/26/22  2:48 PM   Specimen: BLOOD RIGHT WRIST  Result Value Ref Range Status   Specimen Description BLOOD RIGHT WRIST  Final   Special Requests   Final    BOTTLES DRAWN AEROBIC AND ANAEROBIC Blood Culture adequate volume   Culture   Final    NO GROWTH 3 DAYS Performed at [facility], [place] [address] [place], [place] [address]    Report Status PENDING  Incomplete  Respiratory (~20 pathogens) panel by PCR     Status: None   Collection Time: 05/26/22  4:59 PM   Specimen: Nasopharyngeal Swab; Respiratory  Result Value Ref Range Status   Adenovirus NOT DETECTED NOT DETECTED Final   Coronavirus 229E NOT DETECTED NOT DETECTED Final    Comment: (NOTE) The Coronavirus on the Respiratory Panel, DOES NOT test for the novel  Coronavirus (2019 nCoV)    Coronavirus HKU1 NOT DETECTED NOT DETECTED Final   Coronavirus NL63 NOT DETECTED NOT DETECTED Final   Coronavirus OC43 NOT DETECTED NOT DETECTED Final   Metapneumovirus NOT DETECTED NOT DETECTED Final   Rhinovirus / Enterovirus NOT DETECTED NOT DETECTED Final   Influenza A NOT DETECTED NOT DETECTED Final   Influenza B NOT DETECTED NOT DETECTED Final   Parainfluenza Virus 1 NOT DETECTED NOT DETECTED Final   Parainfluenza Virus 2 NOT DETECTED NOT DETECTED Final   Parainfluenza Virus 3 NOT DETECTED NOT DETECTED Final   Parainfluenza Virus 4 NOT DETECTED NOT DETECTED Final   Respiratory Syncytial Virus NOT DETECTED NOT DETECTED Final   Bordetella pertussis NOT DETECTED NOT DETECTED Final   Bordetella Parapertussis NOT DETECTED NOT DETECTED Final   Chlamydophila pneumoniae NOT DETECTED NOT DETECTED Final    Mycoplasma pneumoniae NOT DETECTED NOT DETECTED Final    Comment: Performed at [facility], [place]. [address], [place], [place] [address]  Resp panel by RT-PCR (RSV, Flu A&B, Covid) Anterior Nasal Swab     Status: None   Collection Time: 05/26/22  4:59 PM   Specimen: Anterior Nasal Swab  Result Value Ref Range Status   SARS Coronavirus 2 by RT PCR NEGATIVE NEGATIVE Final    Comment: (NOTE) SARS-CoV-2 target nucleic acids are NOT DETECTED.  The SARS-CoV-2 RNA is generally detectable in upper respiratory specimens during the acute phase of infection. The lowest concentration of SARS-CoV-2 viral copies this assay can detect is 138 copies/mL. A negative result does not preclude SARS-Cov-2 infection and should not be used as the sole basis for treatment or other patient management decisions. A negative result may occur with  improper specimen collection/handling, submission of specimen other than nasopharyngeal swab, presence of viral mutation(s) within the areas targeted by this assay, and inadequate number of viral copies(<138 copies/mL). A negative result must be combined with clinical observations, patient history, and epidemiological information. The expected result is Negative.  Fact Sheet for Patients:  EntrepreneurPulse.com.au  Fact Sheet for Healthcare Providers:  IncredibleEmployment.be  This test is no t yet approved or cleared by the [place] FDA and  has been authorized for detection and/or diagnosis of SARS-CoV-2 by FDA under an Emergency Use Authorization (EUA). This EUA will remain  in effect (meaning this test can be used) for the duration of the COVID-19 declaration under Section 564(b)(1) of the Act, 21 U.S.C.section 360bbb-3(b)(1), unless the authorization is terminated  or revoked sooner.       Influenza A by PCR NEGATIVE NEGATIVE Final   Influenza B by PCR NEGATIVE NEGATIVE Final    Comment: (NOTE) The Xpert Xpress  SARS-CoV-2/FLU/RSV plus assay is intended as an aid in the diagnosis of influenza from Nasopharyngeal swab specimens and should not be used as a sole basis for treatment. Nasal washings and aspirates are unacceptable for Xpert Xpress SARS-CoV-2/FLU/RSV testing.  Fact Sheet for Patients: EntrepreneurPulse.com.au  Fact Sheet for Healthcare Providers: IncredibleEmployment.be  This test is not yet approved or cleared by the [place] FDA and has been authorized for detection and/or diagnosis of SARS-CoV-2 by FDA under an Emergency Use Authorization (EUA). This EUA will remain in effect (meaning this  test can be used) for the duration of the COVID-19 declaration under Section 564(b)(1) of the Act, 21 U.S.C. section 360bbb-3(b)(1), unless the authorization is terminated or revoked.     Resp Syncytial Virus by PCR NEGATIVE NEGATIVE Final    Comment: (NOTE) Fact Sheet for Patients: EntrepreneurPulse.com.au  Fact Sheet for Healthcare Providers: IncredibleEmployment.be  This test is not yet approved or cleared by the [place] FDA and has been authorized for detection and/or diagnosis of SARS-CoV-2 by FDA under an Emergency Use Authorization (EUA). This EUA will remain in effect (meaning this test can be used) for the duration of the COVID-19 declaration under Section 564(b)(1) of the Act, 21 U.S.C. section 360bbb-3(b)(1), unless the authorization is terminated or revoked.  Performed at [facility], [place] [address], [place], [place] [address]      Labs: Basic Metabolic Panel: Recent Labs  Lab 05/24/22 0631 05/25/22 0703 05/28/22 0224 05/29/22 0302 05/30/22 0444  NA 140 138 136 136 137  K 4.0 4.4 4.5 3.8 3.8  CL 108 109 105 107 108  CO2 _0 GLUCOSE 92 101* 166* 157* 182*  BUN 33* 24* 25* 15 12  CREATININE 1.09 1.04 1.30* 0.95 0.95  CALCIUM 8.7* 9.0 8.4* 8.4* 8.5*  MG  --    --   --  1.6* 1.9   Liver Function Tests: Recent Labs  Lab 05/24/22 0631 05/30/22 0444  AST 15 17  ALT 11 17  ALKPHOS 45 62  BILITOT 0.7 0.5  PROT 5.0* 5.1*  ALBUMIN 2.6* 2.4*   CBC: Recent Labs  Lab 05/26/22 0322 05/26/22 0834 05/27/22 0334 05/27/22 1730 05/28/22 0224 05/28/22 1555 05/29/22 0302 05/30/22 0444  WBC 8.1  --  5.4  --  5.1  --  4.9 5.8  HGB 7.1*   < > 7.3* 8.4* 7.0* 7.6* 7.3* 7.8*  HCT 21.5*   < > 22.9* 26.6* 21.3* 22.9* 22.8* 23.6*  MCV 95.1  --  95.8  --  95.1  --  95.4 92.9  PLT 167  --  165  --  172  --  192 193   < > = values in this interval not displayed.   CBG: Recent Labs  Lab 05/29/22 0752 05/29/22 1223 05/29/22 1621 05/29/22 2159 05/30/22 0706  GLUCAP 126* 192* 96 98 138*   Hgb A1c No results for input(s): "HGBA1C" in the last 72 hours. Lipid Profile No results for input(s): "CHOL", "HDL", "LDLCALC", "TRIG", "CHOLHDL", "LDLDIRECT" in the last 72 hours. Thyroid function studies No results for input(s): "TSH", "T4TOTAL", "T3FREE", "THYROIDAB" in the last 72 hours.  Invalid input(s): "FREET3" Urinalysis    Component Value Date/Time   COLORURINE YELLOW 05/27/2022 [place] 05/27/2022 0251   APPEARANCEUR Clear 02/10/2022 1459   LABSPEC 1.020 05/27/2022 0251   LABSPEC 1.019 12/09/2011 1046   PHURINE 5.5 05/27/2022 0251   GLUCOSEU NEGATIVE 05/27/2022 0251   GLUCOSEU Negative 12/09/2011 1046   HGBUR NEGATIVE 05/27/2022 0251   BILIRUBINUR NEGATIVE 05/27/2022 0251   BILIRUBINUR Negative 02/10/2022 1459   BILIRUBINUR Negative 12/09/2011 1046   KETONESUR NEGATIVE 05/27/2022 0251   PROTEINUR NEGATIVE 05/27/2022 0251   NITRITE NEGATIVE 05/27/2022 0251   LEUKOCYTESUR NEGATIVE 05/27/2022 0251   LEUKOCYTESUR Negative 12/09/2011 1046    FURTHER DISCHARGE INSTRUCTIONS:   Get Medicines reviewed and adjusted: Please take all your medications with you for your next visit with your Primary MD   Laboratory/radiological  data: Please request your Primary MD to  go over all hospital tests and procedure/radiological results at the follow up, please ask your Primary MD to get all Hospital records sent to his/her office.   In some cases, they will be blood work, cultures and biopsy results pending at the time of your discharge. Please request that your primary care M.D. goes through all the records of your hospital data and follows up on these results.   Also Note the following: If you experience worsening of your admission symptoms, develop shortness of breath, life threatening emergency, suicidal or homicidal thoughts you must seek medical attention immediately by calling 911 or calling your MD immediately  if symptoms less severe.   You must read complete instructions/literature along with all the possible adverse reactions/side effects for all the Medicines you take and that have been prescribed to you. Take any new Medicines after you have completely understood and accpet all the possible adverse reactions/side effects.    Do not drive when taking Pain medications or sleeping medications (Benzodaizepines)   Do not take more than prescribed Pain, Sleep and Anxiety Medications. It is not advisable to combine anxiety,sleep and pain medications without talking with your primary care practitioner   Special Instructions: If you have smoked or chewed Tobacco  in the last 2 yrs please stop smoking, stop any regular Alcohol  and or any Recreational drug use.   Wear Seat belts while driving.   Please note: You were cared for by a hospitalist during your hospital stay. Once you are discharged, your primary care physician will handle any further medical issues. Please note that NO REFILLS for any discharge medications will be authorized once you are discharged, as it is imperative that you return to your primary care physician (or establish a relationship with a primary care physician if you do not have one) for your post  hospital discharge needs so that they can reassess your need for medications and monitor your lab values.  Time coordinating discharge: 35 minutes  SIGNED:  Marzetta Board, MD, PhD 05/30/2022, 10:04 AM

## 2022-05-30 NOTE — Progress Notes (Signed)
Home meds returned to patient, awaiting for TOC meds.

## 2022-05-30 NOTE — Progress Notes (Signed)
ANTICOAGULATION CONSULT NOTE - Follow Up Consult  Pharmacy Consult for heparin Indication: atrial fibrillation  Labs: Recent Labs    05/28/22 0224 05/28/22 1555 05/28/22 1555 05/29/22 0302 05/29/22 1147 05/29/22 2050 05/30/22 0444  HGB 7.0* 7.6*  --  7.3*  --   --  7.8*  HCT 21.3* 22.9*  --  22.8*  --   --  23.6*  PLT 172  --   --  192  --   --  193  APTT  --   --   --  23*  --   --   --   HEPARINUNFRC  --   --    < > <0.10* 0.28* 0.21* 0.40  CREATININE 1.30*  --   --  0.95  --   --  0.95   < > = values in this interval not displayed.    Assessment/Plan:  82yo male therapeutic on heparin after rate change. Will continue infusion at current rate of 1500 units/hr and confirm stable with additional level.   Wynona Neat, PharmD, BCPS  05/30/2022,6:24 AM

## 2022-05-30 NOTE — Progress Notes (Signed)
Progress Note  Primary GI: Dr. Loistine Simas at [facility] in [place].   Scheduled for upcoming colonoscopy 06/10/22 w Dr Andrey Farmer    Subjective  Chief Complaint:HGB 6, melenic stools  [name], wife is at bedside.  No AB pain, no nausea or vomiting.  He is eating well.  He is walking the halls.  Denies any further fever or chills. Patient had CT enterography last night and had several brown bowel movements afterwards. Still on heparin, denies any melena or hematochezia. Did get IV iron yesterday, hemoglobin stable, states no dizziness, chest pain or shortness of breath.    Objective   Vital signs in last 24 hours: Temp:  [97.5 F (36.4 C)-98.4 F (36.9 C)] 98.2 F (36.8 C) (01/05 0743) Pulse Rate:  [59-67] 59 (01/05 0743) Resp:  [17-18] 17 (01/05 0743) BP: (136-155)/(59-74) 139/59 (01/05 0743) SpO2:  [95 %-99 %] 96 % (01/05 0743) Last BM Date : 05/30/22 Last BM recorded by nurses in past 5 days Stool Type: Type 5 (Soft blobs with clear-cut edges) (05/29/2022 10:30 AM)  General:   male in no acute distress  Heart:  Regular rate and rhythm; no murmurs Pulm: Clear anteriorly; no wheezing Abdomen:  Soft, Obese AB, Active bowel sounds. No tenderness . Without guarding and Without rebound, No organomegaly appreciated. Extremities:  without  edema. Neurologic:  Alert and  oriented x4;  No focal deficits.  Psych:  Cooperative. Normal mood and affect.  Intake/Output from previous day: 01/04 0701 - 01/05 0700 In: 2116.6 [P.O.:600; I.V.:1516.6] Out: 600 [Urine:600] Intake/Output this shift: Total I/O In: 220 [P.O.:220] Out: -   Studies/Results: CT ENTERO ABD/PELVIS W CONTAST  Result Date: 05/30/2022 CLINICAL DATA:  Anemia.  GI bleed. EXAM: CT ABDOMEN AND PELVIS WITH CONTRAST (ENTEROGRAPHY) TECHNIQUE: Multidetector CT of the abdomen and pelvis during bolus administration of intravenous contrast. Negative oral contrast was given. RADIATION DOSE REDUCTION: This exam was  performed according to the departmental dose-optimization program which includes automated exposure control, adjustment of the mA and/or kV according to patient size and/or use of iterative reconstruction technique. CONTRAST:  19m OMNIPAQUE IOHEXOL 350 MG/ML SOLN COMPARISON:  CT 01/24/2022 FINDINGS: Lower chest: Lung bases are clear. Trace bilateral pleural effusions. Hepatobiliary: No focal hepatic lesion. Postcholecystectomy. No biliary dilatation. Pancreas: Pancreas is normal. No ductal dilatation. No pancreatic inflammation. Spleen: Normal spleen Adrenals/urinary tract: Adrenal glands, kidneys, ureters and bladder without acute findings. Stomach/Bowel: 6,, duodenum small-bowel normal. No small dilatation or obstruction. No inflammation identified. Ascending, transverse descending colon normal. Non formed stool in the LEFT colon. Rectum normal. Vascular/Lymphatic: Abdominal aorta is normal caliber with atherosclerotic calcification. There is no retroperitoneal or periportal lymphadenopathy. No pelvic lymphadenopathy. Reproductive: TURP defect in the prostatic Other: No free fluid. Musculoskeletal: No aggressive osseous lesion. IMPRESSION: 1. No acute abdominopelvic findings. 2. Non formed stool in the LEFT colon. 3. Small bilateral pleural effusions. Electronically Signed   By: SSuzy BouchardM.D.   On: 05/30/2022 08:12    Lab Results: Recent Labs    05/28/22 0224 05/28/22 1555 05/29/22 0302 05/30/22 0444  WBC 5.1  --  4.9 5.8  HGB 7.0* 7.6* 7.3* 7.8*  HCT 21.3* 22.9* 22.8* 23.6*  PLT 172  --  192 193   BMET Recent Labs    05/28/22 0224 05/29/22 0302 05/30/22 0444  NA 136 136 137  K 4.5 3.8 3.8  CL 105 107 108  CO2 _0 GLUCOSE 166* 157* 182*  BUN 25* 15 12  CREATININE  1.30* 0.95 0.95  CALCIUM 8.4* 8.4* 8.5*   LFT Recent Labs    05/30/22 0444  PROT 5.1*  ALBUMIN 2.4*  AST 17  ALT 17  ALKPHOS 62  BILITOT 0.5   PT/INR No results for input(s): "LABPROT", "INR" in the  last 72 hours.   Scheduled Meds:  diltiazem  120 mg Oral Daily   gabapentin  300 mg Oral QHS   insulin aspart  0-9 Units Subcutaneous TID WC   leflunomide  10 mg Oral Daily   nebivolol  10 mg Oral Daily   pantoprazole  40 mg Oral Daily   Pirfenidone  267 mg Oral TID WC   polyethylene glycol  17 g Oral Daily   pravastatin  20 mg Oral QPM   senna-docusate  2 tablet Oral QHS   sodium chloride flush  3 mL Intravenous Q12H   thyroid  90 mg Oral BH-q7a   Continuous Infusions:  heparin Stopped (05/30/22 1016)   lactated ringers Stopped (05/30/22 1016)      Patient profile:   82 year old male with history of A-fib on Xarelto, SSS status post PM, ASD status PCI, OSA on CPAP with OSA overlap, hypothyroidism, psoriatic arthritis, diabetes presents with melena.   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 Colonoscopy 12/31 showed diverticulosis and internal hemorrhoids.  VCE was swallowed on 12/31 without any evidence of small bowel pathology. CT enterography without any evidence of small bowel process Hemoglobin 7.8 and increased from yesterday. Received IV iron yesterday. Will plan on transitioning from heparin to DOAC, can consider Eliquis twice daily due to less bleeding risk. Will plan on follow-up with our office with Dr. Lorenso Courier, this should be being set up.  History of constipation Continue MiraLAX daily outpatient  Fever 05/28/2022 was after completing third PRBC high probability blood reaction. No elevated WBC, afebrile. Respiratory pathology panel negative, blood cultures negative day 2 Empiric Vanco, metronidazole and cefepime in place.  Azotemia BUN as high as 49 improved   Principal Problem:   GI bleed Active Problems:   Complete heart block (HCC)   Hypertension   Atrial fibrillation (HCC)   Hyperlipidemia, unspecified   Hypothyroidism    Hypothyroidism, unspecified   Rheumatoid arthritis ([place])   Coronary artery disease   OSA (obstructive sleep apnea)   ILD (interstitial lung disease) ([place])   Benign prostatic hyperplasia with urinary frequency   Symptomatic anemia   Dysphagia   Heme positive stool   Anticoagulant long-term use    LOS: 6 days   Vladimir Crofts  05/30/2022, 10:38 AM

## 2022-06-01 LAB — CULTURE, BLOOD (ROUTINE X 2)
Culture: NO GROWTH
Culture: NO GROWTH
Special Requests: ADEQUATE
Special Requests: ADEQUATE

## 2022-06-10 ENCOUNTER — Ambulatory Visit: Admit: 2022-06-10 | Payer: Medicare Other

## 2022-06-10 SURGERY — COLONOSCOPY WITH PROPOFOL
Anesthesia: General

## 2022-07-01 ENCOUNTER — Other Ambulatory Visit (INDEPENDENT_AMBULATORY_CARE_PROVIDER_SITE_OTHER): Payer: Medicare Other

## 2022-07-01 ENCOUNTER — Ambulatory Visit (INDEPENDENT_AMBULATORY_CARE_PROVIDER_SITE_OTHER): Payer: Medicare Other | Admitting: Internal Medicine

## 2022-07-01 ENCOUNTER — Encounter: Payer: Self-pay | Admitting: Internal Medicine

## 2022-07-01 VITALS — BP 140/68 | HR 65 | Ht 67.0 in | Wt 173.0 lb

## 2022-07-01 DIAGNOSIS — R634 Abnormal weight loss: Secondary | ICD-10-CM

## 2022-07-01 DIAGNOSIS — K315 Obstruction of duodenum: Secondary | ICD-10-CM | POA: Diagnosis not present

## 2022-07-01 DIAGNOSIS — R5383 Other fatigue: Secondary | ICD-10-CM

## 2022-07-01 DIAGNOSIS — D509 Iron deficiency anemia, unspecified: Secondary | ICD-10-CM

## 2022-07-01 LAB — IBC + FERRITIN
Ferritin: 50.9 ng/mL (ref 22.0–322.0)
Iron: 23 ug/dL — ABNORMAL LOW (ref 42–165)
Saturation Ratios: 6.8 % — ABNORMAL LOW (ref 20.0–50.0)
TIBC: 340.2 ug/dL (ref 250.0–450.0)
Transferrin: 243 mg/dL (ref 212.0–360.0)

## 2022-07-01 LAB — VITAMIN D 25 HYDROXY (VIT D DEFICIENCY, FRACTURES): VITD: 26.55 ng/mL — ABNORMAL LOW (ref 30.00–100.00)

## 2022-07-01 NOTE — Patient Instructions (Signed)
Your provider has requested that you go to the basement level for lab work before leaving today. Press "B" on the elevator. The lab is located at the first door on the left as you exit the elevator.  We have sent over a referral to Nutrition and Diabetes.  Follow up in 3 months.  Due to recent changes in healthcare laws, you may see the results of your imaging and laboratory studies on MyChart before your provider has had a chance to review them.  We understand that in some cases there may be results that are confusing or concerning to you. Not all laboratory results come back in the same time frame and the provider may be waiting for multiple results in order to interpret others.  Please give [place] 48 hours in order for your provider to thoroughly review all the results before contacting the office for clarification of your results.    It was a pleasure to see you today!  Thank you for trusting me with your gastrointestinal care!

## 2022-07-01 NOTE — Progress Notes (Signed)
Chief Complaint: IDA  HPI : 82 year old male with history of A-fib, heart block s/p PM, ACD s/p PCI, OSA on CPAP, COPD, hypothyroidism, psoriatic arthritis, DM presents for follow up after a recent hospitalization for melena and IDA  He was hospitalized 05/23/22-05/30/22 with melena, weakness, and lightheadedness, found to have IDA with Hb down to 6. Patient had an extensive work up at that time including EGD, colonoscopy, VCE, and CTE that were unrevealing for a source of his GI bleed.   He still feels like his energy has not been back after his recent hospitalization. His taste has not come back after he lost it 3 months ago. Food doesn't taste good, but he is still able to eat. Endorses dry mouth. Denies N&V. Dysphagia has improved after the esophageal dilation. He does get full really easily. He may have lost 10 lbs over the last few months. Denies hematochezia or melena. Denies abdominal pain. He is no longer taking his blood thinner  Wt Readings from Last 3 Encounters:  07/01/22 173 lb (78.5 kg)  05/24/22 172 lb (78 kg)  05/06/22 173 lb 9.6 oz (78.7 kg)   Past Medical History:  Diagnosis Date   Anemia    Appendicitis    Atrial fibrillation ([place]) 12/12/2013   BPH (benign prostatic hypertrophy)    CAD (coronary artery disease) 12/2015   Cath by Dr [name] reveals distal and small vessel CAD.  Medical therapy advised.   Chest pain 12/03/2015   CHF (congestive heart failure) (HCC)    Complete heart block ([place])    s/p PPM   Coronary artery disease    Coronary artery disease involving native coronary artery of native heart with unstable angina pectoris ([place]) 08/11/2017   D-dimer, elevated 04/03/2017   Drug reaction 07/11/2021   History of blood transfusion 1968   "probably; related [place]   History of kidney stones    History of SCC (squamous cell carcinoma) of skin 07/24/2020   right upper arm/excision   Hyperglycemia 11/05/2013   Hyperlipidemia 11/05/2013    Hypertension    Hypothyroidism    Hypothyroidism, unspecified 11/05/2013   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  AHI was 20/h while supine.  Oxygen saturations dropped to 87%.  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   Rheumatoid arthritis ([place])    "hands" (08/11/2017)   Type II diabetes mellitus ([place])      Past Surgical History:  Procedure Laterality Date   BACK SURGERY     BALLOON DILATION N/A 05/24/2022   Procedure: BALLOON DILATION;  Surgeon: Sharyn Creamer, MD;  Location: [facility] ENDOSCOPY;  Service: Gastroenterology;  Laterality: N/A;   CARDIAC CATHETERIZATION N/A 12/28/2015   Procedure: Left Heart Cath and Coronary Angiography;  Surgeon: Belva Crome, MD;  Location: [place] CV LAB;  Service: Cardiovascular;  Laterality: N/A;   CATARACT EXTRACTION W/ INTRAOCULAR LENS  IMPLANT, BILATERAL Bilateral    COLONOSCOPY WITH PROPOFOL N/A 05/25/2022   Procedure: COLONOSCOPY WITH PROPOFOL;  Surgeon: Sharyn Creamer, MD;  Location: [place];  Service: Gastroenterology;  Laterality: N/A;   CORONARY ANGIOPLASTY WITH STENT PLACEMENT  08/11/2017   "2 stents"   CORONARY STENT INTERVENTION N/A 08/11/2017   Procedure: CORONARY STENT INTERVENTION;  Surgeon: [name]  A, MD;  Location: [place] CV LAB;  Service: Cardiovascular;  Laterality: N/A;   CYSTOSCOPY W/ STONE MANIPULATION     ESOPHAGOGASTRODUODENOSCOPY (EGD) WITH PROPOFOL N/A 05/24/2022   Procedure: ESOPHAGOGASTRODUODENOSCOPY (EGD) WITH PROPOFOL;  Surgeon: Sharyn Creamer, MD;  Location: [place];  Service: Gastroenterology;  Laterality: N/A;   GIVENS CAPSULE STUDY N/A 05/25/2022   Procedure: GIVENS CAPSULE STUDY;  Surgeon: Sharyn Creamer, MD;  Location: [place];  Service: Gastroenterology;  Laterality: N/A;   INGUINAL  HERNIA REPAIR Left    INSERT / REPLACE / REMOVE PACEMAKER  12/09/2011   SJM Accent DR RF implanted by DR Allred for complete heart block and syncope   JOINT REPLACEMENT     LAPAROSCOPIC CHOLECYSTECTOMY     LEAD REVISION/REPAIR N/A 08/09/2020   Procedure: LEAD REVISION/REPAIR;  Surgeon: [name], MD;  Location: [place] CV LAB;  Service: Cardiovascular;  Laterality: N/A;   LITHOTRIPSY     LUMBAR [place]     "removed arthritis and spurs"   PERMANENT PACEMAKER INSERTION N/A 12/09/2011   Procedure: PERMANENT PACEMAKER INSERTION;  Surgeon: [name], MD;  Location[facility] CATH LAB;  Service: Cardiovascular;  Laterality: N/A;   PPM GENERATOR CHANGEOUT N/A 08/09/2020   Procedure: PPM GENERATOR CHANGEOUT;  Surgeon: [name], MD;  Location: [place] CV LAB;  Service: Cardiovascular;  Laterality: N/A;   REPLACEMENT TOTAL KNEE Right    RIGHT HEART CATH N/A 05/29/2021   Procedure: RIGHT HEART CATH;  Surgeon: Troy Sine, MD;  Location: [place] CV LAB;  Service: Cardiovascular;  Laterality: N/A;   RIGHT/LEFT HEART CATH AND CORONARY ANGIOGRAPHY N/A 08/11/2017   Procedure: RIGHT/LEFT HEART CATH AND CORONARY ANGIOGRAPHY;  Surgeon: Larey Dresser, MD;  Location: [place] CV LAB;  Service: Cardiovascular;  Laterality: N/A;   TRANSURETHRAL RESECTION OF PROSTATE  2017/2018   Family History  Problem Relation Age of Onset   Pancreatic cancer Mother    Bone cancer Father    Alzheimer's disease Sister    Heart attack Paternal Uncle    Arthritis Maternal Grandfather    Alzheimer's disease Paternal Grandmother    Lung cancer Paternal Grandfather    Social History   Tobacco Use   Smoking status: Former    Packs/day: 1.00    Years: 5.00    Total pack years: 5.00    Types: Cigarettes    Quit date: 07/10/1972    Years since quitting: 50.0   Smokeless tobacco: Former    Types: Chew    Quit date: 1975  Vaping Use   Vaping Use: Never used  Substance Use Topics   Alcohol use:  Yes    Alcohol/week: 1.0 standard drink of alcohol    Types: 1 Glasses of wine per week   Drug use: No   Current Outpatient Medications  Medication Sig Dispense Refill   acetaminophen (TYLENOL) 500 MG tablet Take 500 mg by mouth every 6 (six) hours as needed for moderate pain.     allopurinol (ZYLOPRIM) 300 MG tablet Take 300 mg by mouth daily.     aspirin EC 81 MG tablet Take 1 tablet (81 mg total) by mouth daily. 30 tablet 11   clobetasol (OLUX) 0.05 % topical foam Apply 1 Application topically 2 (two) times daily. Scalp,ears and elbows     docusate sodium (COLACE) 100 MG capsule Take 100 mg by mouth 2 (two) times daily.     gabapentin (NEURONTIN) 300 MG capsule Take 300 mg by mouth at bedtime.  leflunomide (ARAVA) 10 MG tablet Take 10 mg by mouth daily.     nebivolol (BYSTOLIC) 5 MG tablet Take 5 mg by mouth daily.     nitroGLYCERIN (NITROSTAT) 0.4 MG SL tablet Place 1 tablet (0.4 mg total) under the tongue every 5 (five) minutes as needed for chest pain. 75 tablet 2   Omega-3 Fatty Acids (OMEGA-3 FISH OIL PO) Take 1 capsule by mouth daily.     pantoprazole (PROTONIX) 40 MG tablet Take 40 mg by mouth daily.     Pirfenidone (ESBRIET) 267 MG TABS Take 2 tablets (534 mg total) by mouth 3 (three) times daily with meals. **NOTE LOW DOSE due to kidney function** 180 tablet 5   telmisartan (MICARDIS) 80 MG tablet Take 1 tablet (80 mg total) by mouth daily. 90 tablet 3   terbinafine (LAMISIL) 1 % cream Apply twice daily to buttocks and left thigh rash until clear 42 g 2   thyroid (ARMOUR) 90 MG tablet Take 90 mg by mouth every morning.      traZODone (DESYREL) 50 MG tablet Take 50 mg by mouth at bedtime as needed.     triamcinolone ointment (KENALOG) 0.1 % Apply twice daily to arms up to 2 weeks as needed. Avoid applying to face, groin, and axilla. (Patient taking differently: Apply 1 Application topically See admin instructions. Apply twice daily to arms up to 2 weeks as needed. Avoid applying  to face, groin, and axilla.) 80 g 2   Trospium Chloride 60 MG CP24 TAKE 1 CAPSULE BY MOUTH DAILY (Patient taking differently: Take 1 capsule by mouth daily.) 30 capsule 2   VTAMA 1 % CREA APPLY 1 APPLICATION TOPICALLY DAILY 60 g 5   Current Facility-Administered Medications  Medication Dose Route Frequency Provider Last Rate Last Admin   sodium chloride flush (NS) 0.9 % injection 3 mL  3 mL Intravenous Q12H Troy Sine, MD       Facility-Administered Medications Ordered in Other Visits  Medication Dose Route Frequency Provider Last Rate Last Admin   ondansetron (ZOFRAN) 4 mg in sodium chloride 0.9 % 50 mL IVPB  4 mg Intravenous Q6H PRN Ashok Pall, MD       Allergies  Allergen Reactions   Ace Inhibitors Swelling    Tongue swelling, angioedema   Lisinopril     Other reaction(s): Lip swelling, O/E - lip swelling   Celecoxib Rash    Skin rash    Review of Systems: All systems reviewed and negative except where noted in HPI.   Physical Exam: BP (!) 140/68   Pulse 65   Ht '5\' 7"'$  (1.702 m)   Wt 173 lb (78.5 kg)   SpO2 98%   BMI 27.10 kg/m  Constitutional: Pleasant,well-developed, male in no acute distress. HEENT: Normocephalic and atraumatic. Conjunctivae are normal. No scleral icterus. Cardiovascular: Normal rate, regular rhythm.  Pulmonary/chest: Effort normal and breath sounds normal. No wheezing, rales or rhonchi. Abdominal: Soft, nondistended, nontender. Bowel sounds active throughout. There are no masses palpable. No hepatomegaly. Extremities: No edema Neurological: Alert and oriented to person place and time. Skin: Skin is warm and dry. No rashes noted. Psychiatric: Normal mood and affect. Behavior is normal.  Labs 05/2022: CBC with low Hb of 10.6. CMP with elevated glucose of 196. TSH nml.   CT Enterography 05/29/22: IMPRESSION: 1. No acute abdominopelvic findings. 2. Non formed stool in the LEFT colon. 3. Small bilateral pleural effusions.  EGD  05/24/22:   Colonoscopy 05/25/22:   VCE 05/25/22  ASSESSMENT AND PLAN: IDA Fatigue Altered taste Weight loss Duodenal stenosis Patient presents for follow up of IDA after a recent hospitalization when his EGD, colonoscopy, and VCE did not show any obvious source of GI bleed. Patient may have had a GI bleed that self-resolved. On his recent CBCs, his blood counts have been improving over time (increased to 10.6 on his recent labs from 7.8 at the time of his discharge a month ago), which is reassuring to me that he is no longer bleeding. He does have some fatigue so will recheck his iron levels and vitamin D levels today. Since the patient did have a duodenal stenosis that was noted previously, I did offer the patient the opportunity to evaluate and potentially dilate the stenosis, but the patient declined at this time. He does complain of some dry mouth, which may be due to his trospium medication for overactive bladder. - Continue oral iron supplement - Check vitamin D, ferritin/IBC - Refer to a nutritionist to help with finding foods that the patient can tolerate eating - Patient declined EGD at this time - RTC 3 months   Christia Reading, MD  I spent 46 minutes of time, including in depth chart review, independent review of results as outlined above, communicating results with the patient directly, face-to-face time with the patient, coordinating care, ordering studies and medications as appropriate, and documentation.

## 2022-07-11 ENCOUNTER — Encounter: Payer: Self-pay | Admitting: Physician Assistant

## 2022-07-11 ENCOUNTER — Ambulatory Visit: Payer: Medicare Other | Attending: Physician Assistant | Admitting: Physician Assistant

## 2022-07-11 VITALS — BP 128/74 | HR 60 | Ht 67.0 in | Wt 174.8 lb

## 2022-07-11 DIAGNOSIS — K922 Gastrointestinal hemorrhage, unspecified: Secondary | ICD-10-CM | POA: Diagnosis not present

## 2022-07-11 DIAGNOSIS — E119 Type 2 diabetes mellitus without complications: Secondary | ICD-10-CM | POA: Diagnosis present

## 2022-07-11 DIAGNOSIS — I4891 Unspecified atrial fibrillation: Secondary | ICD-10-CM | POA: Diagnosis present

## 2022-07-11 DIAGNOSIS — Z950 Presence of cardiac pacemaker: Secondary | ICD-10-CM | POA: Diagnosis present

## 2022-07-11 DIAGNOSIS — I10 Essential (primary) hypertension: Secondary | ICD-10-CM | POA: Diagnosis present

## 2022-07-11 DIAGNOSIS — I2510 Atherosclerotic heart disease of native coronary artery without angina pectoris: Secondary | ICD-10-CM | POA: Diagnosis present

## 2022-07-11 MED ORDER — PRAVASTATIN SODIUM 20 MG PO TABS: 20.0000 mg | ORAL_TABLET | Freq: Every evening | ORAL | 3 refills | Status: AC

## 2022-07-11 MED ORDER — APIXABAN 5 MG PO TABS: 5.0000 mg | ORAL_TABLET | Freq: Two times a day (BID) | ORAL | 3 refills | Status: DC

## 2022-07-11 NOTE — Progress Notes (Unsigned)
Cardiology Office Note:    Date:  07/13/2022   ID:  [name], DOB Jun 26, 1940, MRN [id number]  PCP:  [name], MD   [place] Providers Cardiologist:  [name], MD Cardiology APP:  Ledora Bottcher, [place]  Electrophysiologist:  [name], MD (Inactive)  Sleep Medicine:  Fransico Him, MD     Referring MD: [name], MD   Chief Complaint  Patient presents with   [facility] visit end of December     History of Present Illness:    VIBHU REYMUNDO is a 82 y.o. male with a hx of CAD, hypothyroidism, complete AV block s/p PPM, OSA on CPAP, atrial fibrillation, HTN, HLD, DM II and RA.  Patient was previously seen by [name] for shortness of breath.  Chest CT showed mild subpleural reticular densities in the posterior lateral aspect of both lower lobe concerning for mild fibrotic interstitial lung disease.  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  He also had severe disease in the proximal left circumflex, proximal OM1, proximal D3.  His dyspnea was felt to be a combination of lung disease and coronary artery disease.  He ended up having successful DES x2 placed the OM1 and left circumflex on 08/11/2017.  Last echocardiogram obtained on 09/17/2017 showed EF 55 to 60%, grade 1 DD, mild LVH, mild to moderate MR, moderate TR, PA peak pressure 41 mmHg.  Pulmonary function test obtained on 11/03/2017 showed diffusion defect consistent with pulmonary vascular status.  Carotid ultrasound obtained on 11/19/2017 showed minimal to moderate amount of bilateral atherosclerotic plaque. Head CT without contrast on the same day showed no evidence of acute intracranial abnormality, mild atrophy and chronic small vessel white matter ischemic changes. He was seen by [name] on [id number],  Bystolic was reduced to 2.5 mg daily. Device interrogation on the day showed less than 1% AT/AF burden, maximum duration 16 seconds.  No high ventricular rates noted.  Estimated longevity 4.2- 4.7 years.  Device programmed to optimize intrinsic conduction.  Cervical CT obtained in October 2019 showed cervical spinal stenosis.  Repeat echocardiogram in 2021 demonstrated EF 50 to 55%, grade 2 DD, trace AI, mild MR, moderately elevated PA systolic pressure.  Bystolic was later increased to 10 mg and lisinopril also increased.  I last saw the patient in April 2021 at which time he was doing well.  He had atrial lead malfunction and underwent a new RV lead placement in March 2022.  Due to progression of interstitial lung disease, he underwent repeat right heart cath on 05/29/2021 which showed mild pulmonary hypertension with PA pressure 55/18, wedge pressure 17 mmHg, mild to moderate MR.  He was seen by pulmonology service in July 2023, PFT shows some improvement.  He was last seen by Dr. Claiborne Billings in November 2023 at which time he was doing well.  He continues to be on Esbriet for interstitial lung disease.  More recently, patient was admitted on 05/23/2022 with weakness and lightheadedness.  He also noticed dark stools.  Hemoglobin was 6.0 on admission and he required 2 units of packed red blood transfusion.  [facility] was consulted and performed upper endoscopy on 12/30 which showed benign-appearing esophageal stenosis, normal stomach, acquired duodenal stenosis.  Colonoscopy performed on the following day was significant for diverticulosis, nonbleeding internal hemorrhoid.  Capsule endoscopy was negative as well.  CT enterography was negative for acute finding.  He was transition from Xarelto to Eliquis due to lower GI bleed profile.   Patient presents today for early posthospital follow-up.  According to the patient, he has been seen by his PCP recently, hemoglobin has increased to 10.6 on repeat blood work near the  end of May.  She was seen by Dr. Jimmye Norman of health and wellness in [place], he says Dr. Jimmye Norman stopped several of his medication including Eliquis, diltiazem, Jardiance, glimepiride and pravastatin.  I am unable to see Dr. Jimmye Norman' note.  He was seen by Dr. Lorenso Courier on 07/01/2022 at which time he was doing okay.  He still feels a little tired, however no chest pain or significant shortness of breath.  His fatigue has significantly improved recently.  We reviewed stroke risk versus bleeding risk with and without Eliquis.  His stroke risk on no therapy is 6.7% annually, while on the Eliquis, stroke risk come down to 2.6%.  He is bleeding risk is 0.6% on no therapy, it does increase to 2.6% on Eliquis.  Eventually, we have decided to restart Eliquis 5 mg twice a day.  I will discontinue his aspirin to lower the risk for GI bleeding.  He will need a repeat CBC in 2 weeks.  Since given the history of CAD, he will need a statin therapy.  I have advised to restart pravastatin 20 mg daily. I am okay with him coming off of diltiazem at this time, heart rate around 60 bpm on pacemaker.  I recommend 53-monthfollow-up for close monitoring.       Past Medical History:  Diagnosis Date   Anemia    Appendicitis    Atrial fibrillation ([place] 12/12/2013   BPH (benign prostatic hypertrophy)    CAD (coronary artery disease) 12/2015   Cath by Dr STamala Julianreveals distal and small vessel CAD.  Medical therapy advised.   Chest pain 12/03/2015   CHF (congestive heart failure) (HCC)    Complete heart block ([place]    s/p PPM   Coronary artery disease    Coronary artery disease involving native coronary artery of native heart with unstable angina pectoris ([place] 08/11/2017   D-dimer, elevated 04/03/2017   Drug reaction 07/11/2021   History of blood transfusion 1968   "probably[place]   History of kidney stones    History of SCC (squamous cell carcinoma) of skin 07/24/2020   right upper  arm/excision   Hyperglycemia 11/05/2013   Hyperlipidemia 11/05/2013   Hypertension    Hypothyroidism    Hypothyroidism, unspecified 11/05/2013   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  AHI was 20/h while supine.  Oxygen saturations dropped to 87%.  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   Rheumatoid arthritis ([place]    "hands" (08/11/2017)   Type II diabetes mellitus ([place]     Past Surgical History:  Procedure Laterality Date   BACK SURGERY     BALLOON DILATION N/A 05/24/2022   Procedure: BALLOON DILATION;  Surgeon: DSharyn Creamer MD;  [facility];  Service: Gastroenterology;  Laterality: N/A;   CARDIAC CATHETERIZATION N/A 12/28/2015   Procedure: Left Heart Cath and Coronary Angiography;  Surgeon: HBelva Crome MD;  Location: [facility];  Service: Cardiovascular;  Laterality: N/A;   CATARACT EXTRACTION W/ INTRAOCULAR LENS  IMPLANT, BILATERAL Bilateral    COLONOSCOPY WITH PROPOFOL N/A 05/25/2022   Procedure: COLONOSCOPY WITH PROPOFOL;  Surgeon: Sharyn Creamer, MD;  Location: [facility];  Service: Gastroenterology;  Laterality: N/A;   CORONARY ANGIOPLASTY WITH STENT PLACEMENT  08/11/2017   "2 stents"   CORONARY STENT INTERVENTION N/A 08/11/2017   Procedure: CORONARY STENT INTERVENTION;  Surgeon: Troy Sine, MD;  Location: [facility];  Service: Cardiovascular;  Laterality: N/A;   CYSTOSCOPY W/ STONE MANIPULATION     ESOPHAGOGASTRODUODENOSCOPY (EGD) WITH PROPOFOL N/A 05/24/2022   Procedure: ESOPHAGOGASTRODUODENOSCOPY (EGD) WITH PROPOFOL;  Surgeon: Sharyn Creamer, MD;  Location: [facility];  Service: Gastroenterology;  Laterality: N/A;   GIVENS CAPSULE STUDY N/A 05/25/2022   Procedure: GIVENS CAPSULE STUDY;  Surgeon: Sharyn Creamer, MD;  Location: [facility];  Service: Gastroenterology;  Laterality: N/A;   INGUINAL HERNIA REPAIR Left    INSERT / REPLACE / REMOVE PACEMAKER  12/09/2011   SJM Accent DR RF implanted by DR Allred for complete heart block and syncope   JOINT REPLACEMENT     LAPAROSCOPIC CHOLECYSTECTOMY     LEAD REVISION/REPAIR N/A 08/09/2020   Procedure: LEAD REVISION/REPAIR;  Surgeon: [name], MD;  Location: [facility];  Service: Cardiovascular;  Laterality: N/A;   LITHOTRIPSY     LUMBAR [place]     "removed arthritis and spurs"   PERMANENT PACEMAKER INSERTION N/A 12/09/2011   Procedure: PERMANENT PACEMAKER INSERTION;  Surgeon: [name], MD;  [facility];  Service: Cardiovascular;  Laterality: N/A;   PPM GENERATOR CHANGEOUT N/A 08/09/2020   Procedure: PPM GENERATOR CHANGEOUT;  Surgeon: [name], MD;  Location: [facility] CV LAB;  Service: Cardiovascular;  Laterality: N/A;   REPLACEMENT TOTAL KNEE Right    RIGHT HEART CATH N/A 05/29/2021   Procedure: RIGHT HEART CATH;  Surgeon: Troy Sine, MD;  Location: [facility];  Service: Cardiovascular;  Laterality: N/A;   RIGHT/LEFT HEART CATH AND CORONARY ANGIOGRAPHY N/A 08/11/2017   Procedure: RIGHT/LEFT HEART CATH AND CORONARY ANGIOGRAPHY;  Surgeon: Larey Dresser, MD;  Location: [facility];  Service: Cardiovascular;  Laterality: N/A;   TRANSURETHRAL RESECTION OF PROSTATE  2017/2018    Current Medications: Current Meds  Medication Sig   acetaminophen (TYLENOL) 500 MG tablet Take 500 mg by mouth every 6 (six) hours as needed for moderate pain.   allopurinol (ZYLOPRIM) 300 MG tablet Take 300 mg by mouth daily.   apixaban (ELIQUIS) 5 MG TABS tablet Take 1 tablet (5 mg total) by mouth 2 (two) times daily.   docusate sodium (COLACE) 100 MG capsule Take 100 mg by mouth 2 (two) times daily.   nebivolol (BYSTOLIC) 5 MG tablet Take 5 mg by mouth daily.   nitroGLYCERIN (NITROSTAT) 0.4 MG SL tablet Place 1 tablet (0.4 mg total)  under the tongue every 5 (five) minutes as needed for chest pain.   Omega-3 Fatty Acids (OMEGA-3 FISH OIL PO) Take 1 capsule by mouth daily.   pantoprazole (PROTONIX) 40 MG tablet Take 40 mg by mouth daily.   Pirfenidone (ESBRIET) 267 MG TABS Take 2 tablets (534 mg total) by mouth 3 (three) times daily with meals. **NOTE LOW DOSE due to kidney function**   pravastatin (PRAVACHOL) 20 MG tablet Take 1 tablet (20 mg total) by mouth every evening.   telmisartan (MICARDIS) 80 MG tablet Take  1 tablet (80 mg total) by mouth daily.   thyroid (ARMOUR) 90 MG tablet Take 90 mg by mouth every morning.    Trospium Chloride 60 MG CP24 TAKE 1 CAPSULE BY MOUTH DAILY (Patient taking differently: Take 1 capsule by mouth daily.)   VTAMA 1 % CREA APPLY 1 APPLICATION TOPICALLY DAILY   [DISCONTINUED] aspirin EC 81 MG tablet Take 1 tablet (81 mg total) by mouth daily.   Current Facility-Administered Medications for the 07/11/22 encounter (Office Visit) with [name], PA  Medication   sodium chloride flush (NS) 0.9 % injection 3 mL     Allergies:   Ace inhibitors, Lisinopril, and Celecoxib   Social History   Socioeconomic History   Marital status: Married    Spouse name: Not on file   Number of children: Not on file   Years of education: Not on file   Highest education level: Not on file  Occupational History   Not on file  Tobacco Use   Smoking status: Former    Packs/day: 1.00    Years: 5.00    Total pack years: 5.00    Types: Cigarettes    Quit date: 07/10/1972    Years since quitting: 50.0   Smokeless tobacco: Former    Types: Chew    Quit date: 1975  Vaping Use   Vaping Use: Never used  Substance and Sexual Activity   Alcohol use: Yes    Alcohol/week: 1.0 standard drink of alcohol    Types: 1 Glasses of wine per week   Drug use: No   Sexual activity: Not Currently  Other Topics Concern   Not on file  Social History Narrative   Not on file   Social Determinants of Health   Financial  Resource Strain: Not on file  Food Insecurity: Not on file  Transportation Needs: Not on file  Physical Activity: Not on file  Stress: Not on file  Social Connections: Not on file     Family History: The patient's family history includes Alzheimer's disease in his paternal grandmother and sister; Arthritis in his maternal grandfather; Bone cancer in his father; Heart attack in his paternal uncle; Lung cancer in his paternal grandfather; Pancreatic cancer in his mother.  ROS:   Please see the history of present illness.     All other systems reviewed and are negative.  EKGs/Labs/Other Studies Reviewed:    The following studies were reviewed today:  Echo 04/08/2021 1. Left ventricular ejection fraction, by estimation, is 60 to 65%. Left  ventricular ejection fraction by 3D volume is 61 %. The left ventricle has  normal function. The left ventricle has no regional wall motion  abnormalities. There is mild left  ventricular hypertrophy. Left ventricular diastolic parameters are  consistent with Grade II diastolic dysfunction (pseudonormalization).   2. Right ventricular systolic function is normal. The right ventricular  size is normal. There is moderately elevated pulmonary artery systolic  pressure. The estimated right ventricular systolic pressure is [id number] mmHg.   3. Left atrial size was moderately dilated.   4. Right atrial size was moderately dilated.   5. The mitral valve is abnormal. Mild to moderate mitral valve  regurgitation. Moderate mitral annular calcification.   6. The tricuspid valve is abnormal. Tricuspid valve regurgitation is  moderate.   7. The aortic valve is tricuspid. Aortic valve regurgitation is trivial.  Aortic valve sclerosis is present, with no evidence of aortic valve  stenosis. Aortic regurgitation PHT measures 594 msec.   8.  Cannot exclude a small PFO.   Comparison(s): Changes from prior study are noted. 06/07/2019: LVEF 50-55%.   EKG:  EKG is not  ordered today.    Recent Labs: 05/30/2022: ALT 17; BUN 12; Creatinine, Ser 0.95; Hemoglobin 7.8; Magnesium 1.9; Platelets 193; Potassium 3.8; Sodium 137  Recent Lipid Panel No results found for: "CHOL", "TRIG", "HDL", "CHOLHDL", "VLDL", "LDLCALC", "LDLDIRECT"   Risk Assessment/Calculations:    CHA2DS2-VASc Score = 5   This indicates a 7.2% annual risk of stroke. The patient's score is based upon: CHF History: 0 HTN History: 1 Diabetes History: 1 Stroke History: 0 Vascular Disease History: 1 Age Score: 2 Gender Score: 0          Physical Exam:    VS:  BP 128/74   Pulse 60   Ht 5' 7"$  (1.702 m)   Wt 174 lb 12.8 oz (79.3 kg)   SpO2 98%   BMI 27.38 kg/m        Wt Readings from Last 3 Encounters:  07/11/22 174 lb 12.8 oz (79.3 kg)  07/01/22 173 lb (78.5 kg)  05/24/22 172 lb (78 kg)     GEN:  Well nourished, well developed in no acute distress HEENT: Normal NECK: No JVD; No carotid bruits LYMPHATICS: No lymphadenopathy CARDIAC: RRR, no murmurs, rubs, gallops RESPIRATORY:  Clear to auscultation without rales, wheezing or rhonchi  ABDOMEN: Soft, non-tender, non-distended MUSCULOSKELETAL:  No edema; No deformity  SKIN: Warm and dry NEUROLOGIC:  Alert and oriented x 3 PSYCHIATRIC:  Normal affect   ASSESSMENT:    1. Gastrointestinal hemorrhage, unspecified gastrointestinal hemorrhage type   2. Coronary artery disease involving native coronary artery of native heart without angina pectoris   3. Pacemaker   4. Atrial fibrillation, unspecified type ([place])   5. Essential hypertension   6. Controlled type 2 diabetes mellitus without complication, without long-term current use of insulin (HCC)    PLAN:    In order of problems listed above:  Recent GI bleed: Xarelto has been switched to Eliquis prior to discharge.  For some reason, Eliquis has since been discontinued.  He has not had any further bleeding for the past 2 months.  I recommend we start Eliquis at 5 mg twice  a day.  He will need CBC in 2 weeks.  CAD: Denies any recent chest discomfort  History of pacemaker: Followed by EP service  Atrial fibrillation: Recently started on Eliquis after GI bleed.  However Eliquis has since been discontinued for unknown reason after discharge.  Since the patient has not had any further GI bleeding, I recommended restarting Eliquis  Hypertension: Blood pressure stable  DM2: Managed by primary care provider.           Medication Adjustments/Labs and Tests Ordered: Current medicines are reviewed at length with the patient today.  Concerns regarding medicines are outlined above.  Orders Placed This Encounter  Procedures   CBC   Meds ordered this encounter  Medications   apixaban (ELIQUIS) 5 MG TABS tablet    Sig: Take 1 tablet (5 mg total) by mouth 2 (two) times daily.    Dispense:  180 tablet    Refill:  3   pravastatin (PRAVACHOL) 20 MG tablet    Sig: Take 1 tablet (20 mg total) by mouth every evening.    Dispense:  90 tablet    Refill:  3    Patient Instructions  Medication Instructions:  Eliquis 42m twice daily Stop aspirin Pravastatin 27mat night *If you  need a refill on your cardiac medications before your next appointment, please call your pharmacy*   Lab Work: CBC in 2 weeks If you have labs (blood work) drawn today and your tests are completely normal, you will receive your results only by: [place] (if you have MyChart) OR A paper copy in the mail If you have any lab test that is abnormal or we need to change your treatment, we will call you to review the results.   Follow-Up: At [facility], you and your health needs are our priority.  As part of our continuing mission to provide you with exceptional heart care, we have created designated Provider Care Teams.  These Care Teams include your primary Cardiologist (physician) and Advanced Practice Providers (APPs -  Physician Assistants and Nurse Practitioners) who  all work together to provide you with the care you need, when you need it.  We recommend signing up for the patient portal called "MyChart".  Sign up information is provided on this After Visit Summary.  MyChart is used to connect with patients for Virtual Visits (Telemedicine).  Patients are able to view lab/test results, encounter notes, upcoming appointments, etc.  Non-urgent messages can be sent to your provider as well.   To learn more about what you can do with MyChart, go to [email or web address].    Your next appointment:   2 month(s)  Provider:   With Dr Claiborne Billings or APP     Signed, [name], [place]  07/13/2022 10:49 PM    [place]

## 2022-07-11 NOTE — Patient Instructions (Addendum)
Medication Instructions:  Eliquis 76m twice daily Stop aspirin Pravastatin 244mat night *If you need a refill on your cardiac medications before your next appointment, please call your pharmacy*   Lab Work: CBC in 2 weeks If you have labs (blood work) drawn today and your tests are completely normal, you will receive your results only by: [place] have [place]Chart) OR A paper copy in the mail If you have any lab test that is abnormal or we need to change your treatment, we will call you to review the results.   Follow-Up: [facility].  As part of our continuing mission to provide you with exceptional heart care, we have created designated Provider Care Teams.  These Care Teams include your primary Cardiologist (physician) and Advanced Practice Providers (APPs -  Physician Assistants and Nurse Practitioners) who all work together to provide you with the care you need, when you need it.  We recommend signing up for the patient portal called "MyChart".  Sign up information is provided on this After Visit Summary.  MyChart is used to connect with patients for Virtual Visits (Telemedicine).  Patients are able to view lab/test results, encounter notes, upcoming appointments, etc.  Non-urgent messages can be sent to your provider as well.   To learn more about what you can do with MyChart, go to [email or web address]   Your next appointment:   2 month(s)  Provider:   With Dr KeClaiborne Billingsr APP

## 2022-07-21 LAB — CBC
Hematocrit: 35.1 % — ABNORMAL LOW (ref 37.5–51.0)
Hemoglobin: 11.1 g/dL — ABNORMAL LOW (ref 13.0–17.7)
MCH: 28.2 pg (ref 26.6–33.0)
MCHC: 31.6 g/dL (ref 31.5–35.7)
MCV: 89 fL (ref 79–97)
Platelets: 233 10*3/uL (ref 150–450)
RBC: 3.94 x10E6/uL — ABNORMAL LOW (ref 4.14–5.80)
RDW: 15.3 % (ref 11.6–15.4)
WBC: 8.2 10*3/uL (ref 3.4–10.8)

## 2022-07-22 NOTE — Progress Notes (Signed)
Hemoglobin significantly improved from a month ago. Now it is near the lower border of normal.

## 2022-07-31 ENCOUNTER — Telehealth: Payer: Self-pay | Admitting: *Deleted

## 2022-07-31 NOTE — Telephone Encounter (Signed)
Patient called in to ask for a Rx for a new mask but he has not seen Dr Radford Pax since June of 2019. I asked who he has been seeing for his supplies and he states it has [facility] but they have stopped doing cpaps and now he needs a new dme. Patient was encouraged to make an appointment with Dr Radford Pax asap because now he is considered a new patient. His wife states he has been seeing his lung doctor Ashby Dawes so they will ask him to write a Rx until he can see Dr Radford Pax.

## 2022-08-07 ENCOUNTER — Ambulatory Visit: Payer: Medicare Other

## 2022-08-07 DIAGNOSIS — I495 Sick sinus syndrome: Secondary | ICD-10-CM | POA: Diagnosis not present

## 2022-08-08 LAB — CUP PACEART REMOTE DEVICE CHECK
Battery Remaining Longevity: 82 mo
Battery Remaining Percentage: 77 %
Battery Voltage: 3.01 V
Brady Statistic AP VP Percent: 92 %
Brady Statistic AP VS Percent: 1 %
Brady Statistic AS VP Percent: 7.5 %
Brady Statistic AS VS Percent: 1 %
Brady Statistic RA Percent Paced: 90 %
Brady Statistic RV Percent Paced: 99 %
Date Time Interrogation Session: 20240314020013
Implantable Lead Connection Status: 753985
Implantable Lead Connection Status: 753985
Implantable Lead Implant Date: 20130716
Implantable Lead Implant Date: 20220317
Implantable Lead Location: 753859
Implantable Lead Location: 753860
Implantable Lead Model: 1948
Implantable Lead Model: 5076
Implantable Pulse Generator Implant Date: 20220317
Lead Channel Impedance Value: 350 Ohm
Lead Channel Impedance Value: 510 Ohm
Lead Channel Pacing Threshold Amplitude: 0.5 V
Lead Channel Pacing Threshold Amplitude: 0.5 V
Lead Channel Pacing Threshold Pulse Width: 0.5 ms
Lead Channel Pacing Threshold Pulse Width: 0.5 ms
Lead Channel Sensing Intrinsic Amplitude: 12 mV
Lead Channel Sensing Intrinsic Amplitude: 2 mV
Lead Channel Setting Pacing Amplitude: 0.75 V
Lead Channel Setting Pacing Amplitude: 2 V
Lead Channel Setting Pacing Pulse Width: 0.5 ms
Lead Channel Setting Sensing Sensitivity: 7 mV
Pulse Gen Model: 2272
Pulse Gen Serial Number: 3908614

## 2022-08-12 IMAGING — CT CT CHEST HIGH RESOLUTION
2 of 7 series · 13 of 36 positions shown, 16 images · non-contrast
Comparison: 10/10/2019, 01/12/2019, 07/22/2017

CLINICAL DATA: Interstitial lung disease, shortness of breath,
former smoker



[Series 4: thorax 2.00 cor · coronal · 0.60mm/px · 3 of 175 slices shown]
[im 35/175  lung]
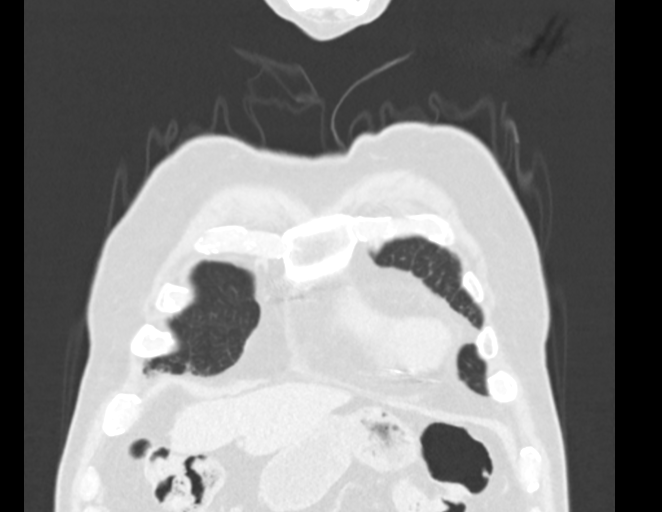
[im 70/175  lung]
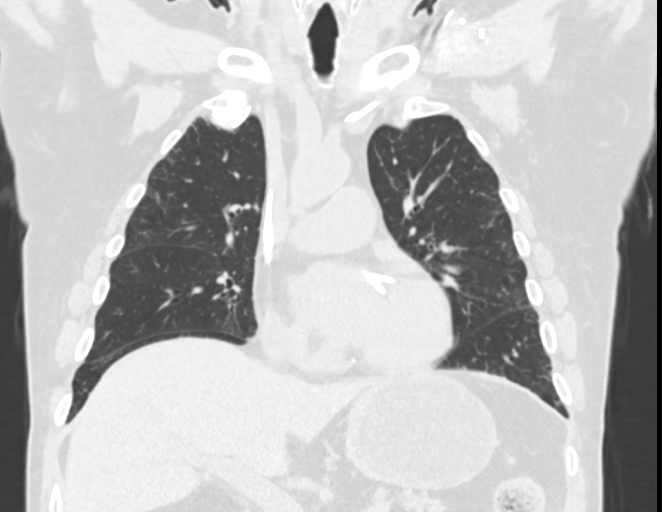
[im 105/175  lung]
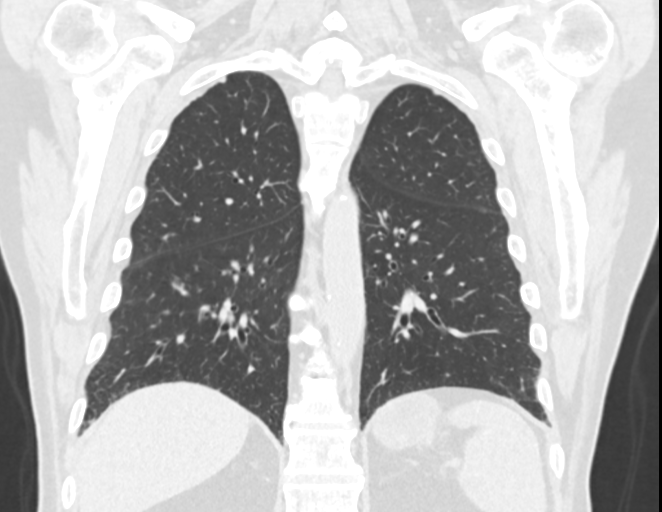

[Series 10: high res (id) thorax 1.00 ax · axial · 0.69mm/px · z∈[-1212,-952]mm · 10 of 308 slices shown, 13 images]
[im 24/308  mediastinal]
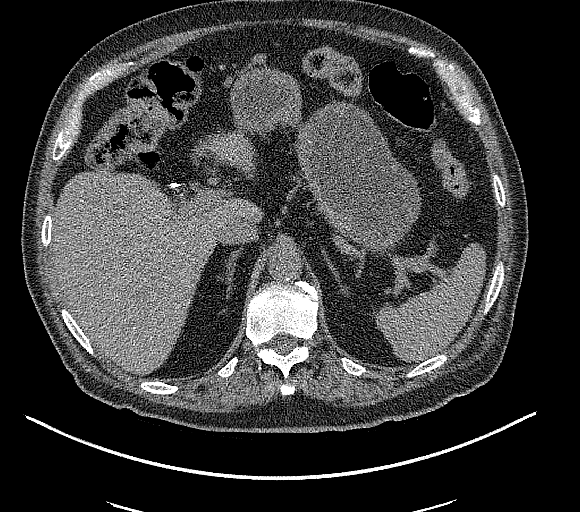
[im 24/308  lung]
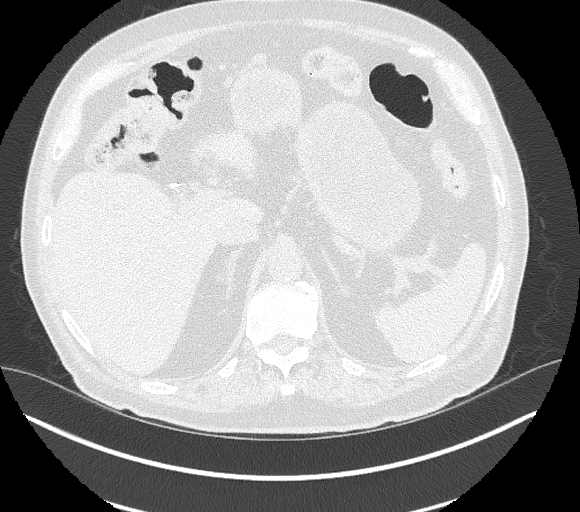
[im 48/308  lung]
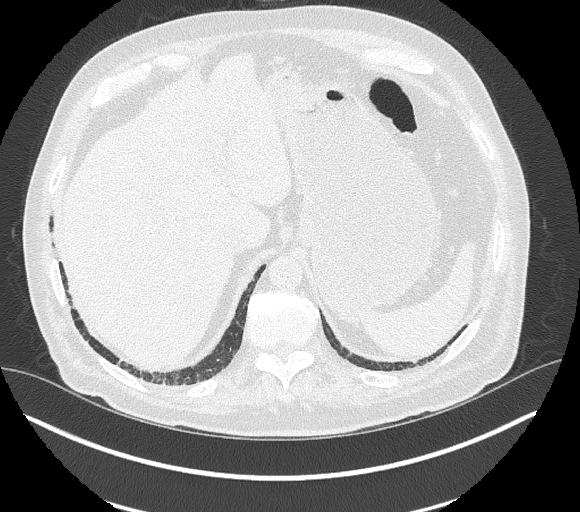
[im 95/308  lung]
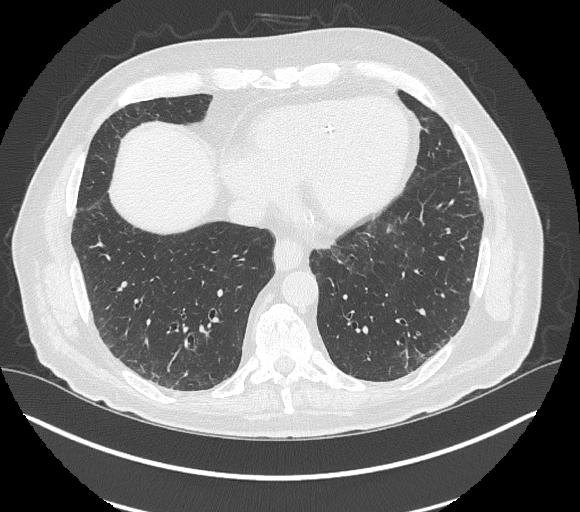
[im 119/308  lung]
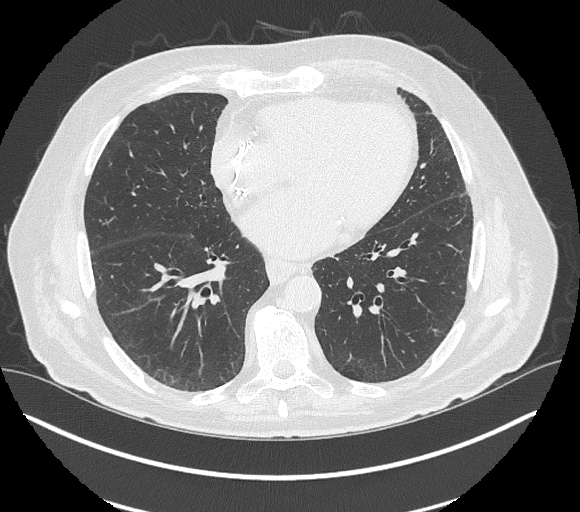
[im 142/308  mediastinal]
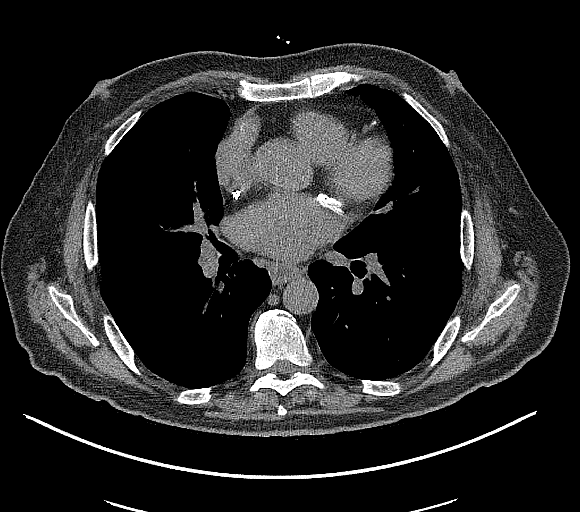
[im 142/308  lung]
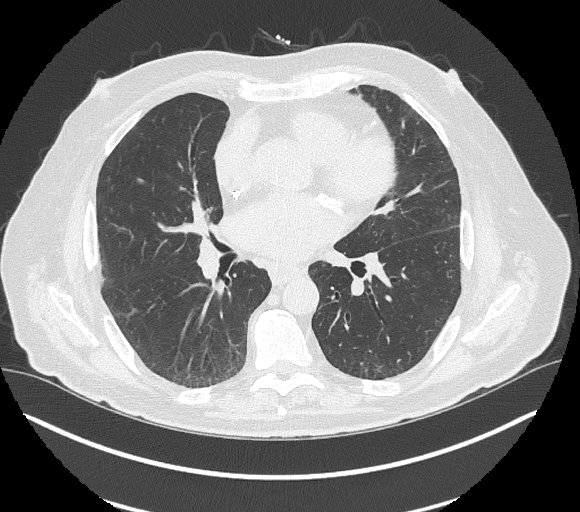
[im 166/308  lung]
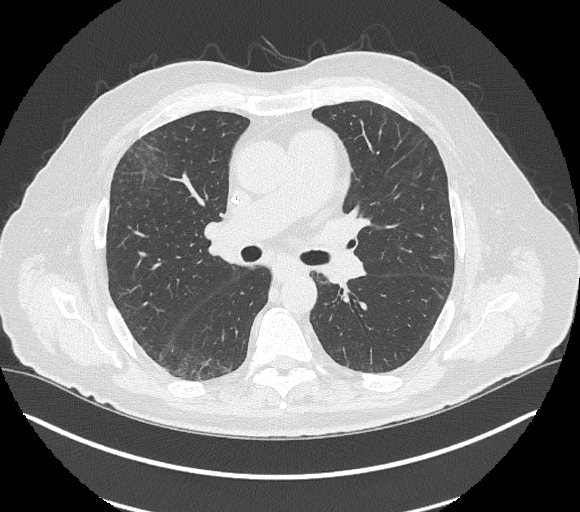
[im 189/308  lung]
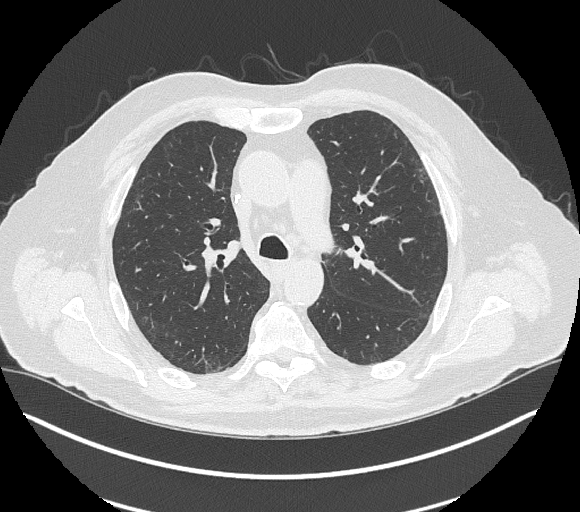
[im 237/308  lung]
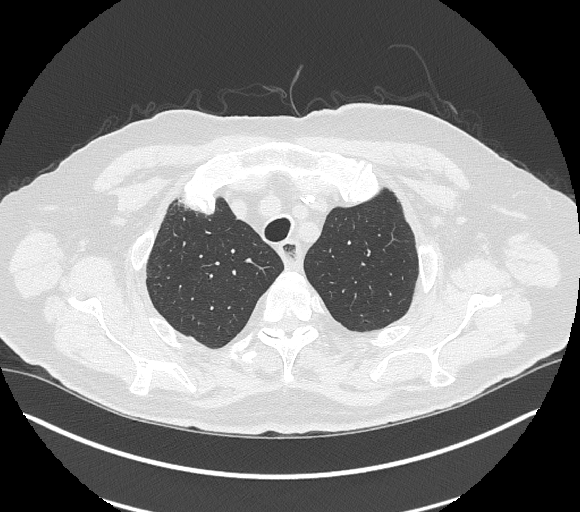
[im 260/308  mediastinal]
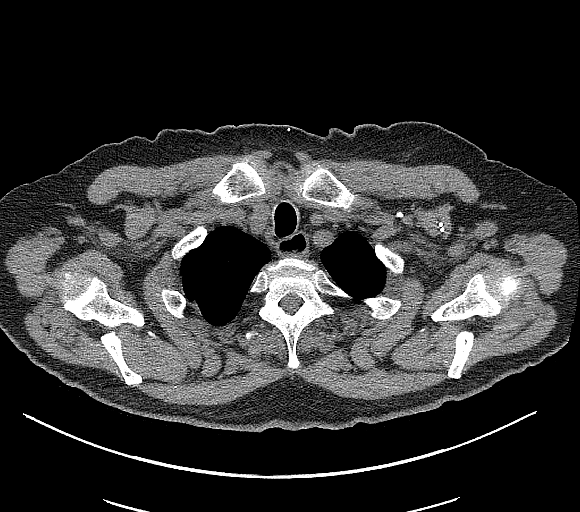
[im 260/308  lung]
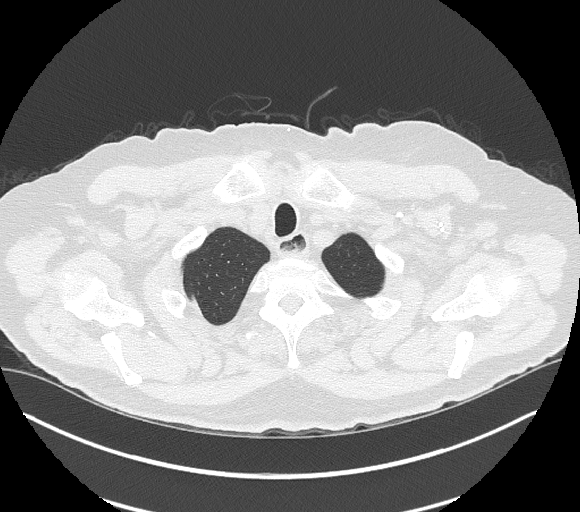
[im 284/308  lung]
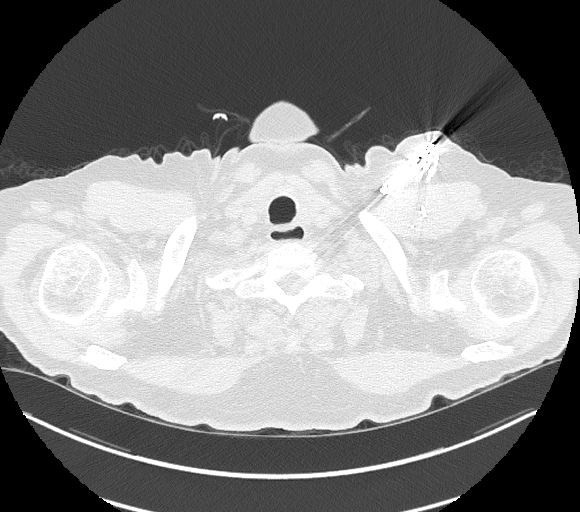

[13 of 36 positions shown; findings below may reference images not displayed]

FINDINGS: Cardiovascular: Aortic atherosclerosis. Left chest multi lead pacer.
Normal heart size. Three-vessel coronary artery calcifications. No
pericardial effusion.

Mediastinum/Nodes: No enlarged mediastinal, hilar, or axillary lymph
nodes. Thyroid gland, trachea, and esophagus demonstrate no
significant findings.

Lungs/Pleura: Unchanged mild pulmonary fibrosis in a pattern with
apical to basal gradient, featuring irregular peripheral
interstitial opacity and associated ground-glass, as well as mild
bibasilar varicoid bronchiectasis, without clear evidence of
subpleural bronchiolectasis or honeycombing. No significant air
trapping on expiratory phase imaging. No pleural effusion or
pneumothorax.

Upper Abdomen: No acute abnormality.

Musculoskeletal: No chest wall abnormality. No suspicious osseous
lesions identified.
IMPRESSION: 1. Unchanged mild pulmonary fibrosis in a pattern with apical to
basal gradient, featuring irregular peripheral interstitial opacity
and associated ground-glass, as well as mild bibasilar varicoid
bronchiectasis, without clear evidence of subpleural
bronchiolectasis or honeycombing. Findings remain indeterminate for
UIP per consensus guidelines, general differential considerations
including both UIP and fibrotic phase NSIP: Diagnosis of Idiopathic
Pulmonary Fibrosis: An Official ATS/ERS/JRS/ALAT Clinical Practice
Guideline. Am J Respir Crit Care Med Vol 198, Dott 5, ppe22-e[DATE]. Coronary artery disease.

Aortic Atherosclerosis (VVAII-IBT.T).

## 2022-08-18 ENCOUNTER — Telehealth: Payer: Self-pay

## 2022-08-18 NOTE — Telephone Encounter (Signed)
Following alert received from [employer] received for device alert for long AT/AF Presenting AF, persistent per trends, overall controlled rates. Burden 8%, Eliquis. Route for persistent arrhythmia.  Patient reports no specific complaints other than generalized fatigue. Reports compliance with medications. States he was recently in the hospital with "internal bleeding" unknown source so his Eliquis does was changed from 5 mg BID to 2.5mg  once a day. Routing to Dr. Quentin Ore for review.

## 2022-08-25 ENCOUNTER — Ambulatory Visit (INDEPENDENT_AMBULATORY_CARE_PROVIDER_SITE_OTHER): Payer: Medicare Other | Admitting: Internal Medicine

## 2022-08-25 DIAGNOSIS — J849 Interstitial pulmonary disease, unspecified: Secondary | ICD-10-CM | POA: Diagnosis not present

## 2022-08-25 LAB — PULMONARY FUNCTION TEST
DL/VA % pred: 141 %
DL/VA: 5.53 ml/min/mmHg/L
DLCO cor % pred: 103 %
DLCO cor: 22.73 ml/min/mmHg
DLCO unc % pred: 91 %
DLCO unc: 20.12 ml/min/mmHg
FEF 25-75 Pre: 2.63 L/sec
FEF2575-%Pred-Pre: 164 %
FEV1-%Pred-Pre: 97 %
FEV1-Pre: 2.34 L
FEV1FVC-%Pred-Pre: 117 %
FEV6-%Pred-Pre: 87 %
FEV6-Pre: 2.8 L
FEV6FVC-%Pred-Pre: 108 %
FVC-%Pred-Pre: 81 %
FVC-Pre: 2.8 L
Pre FEV1/FVC ratio: 84 %
Pre FEV6/FVC Ratio: 100 %

## 2022-08-25 NOTE — Patient Instructions (Signed)
Spirometry and DLCO Performed Today.  

## 2022-08-25 NOTE — Progress Notes (Signed)
Spirometry and DLCO Performed Today.  

## 2022-08-28 ENCOUNTER — Ambulatory Visit (INDEPENDENT_AMBULATORY_CARE_PROVIDER_SITE_OTHER): Payer: Medicare Other | Admitting: Internal Medicine

## 2022-08-28 ENCOUNTER — Telehealth: Payer: Self-pay | Admitting: Internal Medicine

## 2022-08-28 ENCOUNTER — Encounter: Payer: Self-pay | Admitting: Internal Medicine

## 2022-08-28 VITALS — BP 140/74 | HR 70 | Ht 67.0 in | Wt 177.2 lb

## 2022-08-28 DIAGNOSIS — R0609 Other forms of dyspnea: Secondary | ICD-10-CM

## 2022-08-28 DIAGNOSIS — Z77098 Contact with and (suspected) exposure to other hazardous, chiefly nonmedicinal, chemicals: Secondary | ICD-10-CM

## 2022-08-28 DIAGNOSIS — J849 Interstitial pulmonary disease, unspecified: Secondary | ICD-10-CM | POA: Diagnosis not present

## 2022-08-28 DIAGNOSIS — M0600 Rheumatoid arthritis without rheumatoid factor, unspecified site: Secondary | ICD-10-CM

## 2022-08-28 DIAGNOSIS — R5383 Other fatigue: Secondary | ICD-10-CM

## 2022-08-28 DIAGNOSIS — R432 Parageusia: Secondary | ICD-10-CM

## 2022-08-28 DIAGNOSIS — R634 Abnormal weight loss: Secondary | ICD-10-CM

## 2022-08-28 DIAGNOSIS — Z79899 Other long term (current) drug therapy: Secondary | ICD-10-CM | POA: Diagnosis not present

## 2022-08-28 DIAGNOSIS — E559 Vitamin D deficiency, unspecified: Secondary | ICD-10-CM | POA: Diagnosis not present

## 2022-08-28 NOTE — Patient Instructions (Addendum)
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 REcently stable.  Hx of covid March 2023 - did not affect your fibrosis.  Currently having significant fatigue  Plan - - take [place] holiday for 4-6 weeks and see if weight and taste and fatigue improve -Addendum done after he left clinic: Will get CT scan of the chest high-resolution [1 year follow-up in] and echocardiogram   Dysgeusia Unintentional weight loss  -Could be because of pirfenidone v post covid. Not resolved despite prior pirfenidone holiday and despite coming off [place] - Repeat pirfenidone holiday but this time for 4-6 weeks and then reassess  Fatigue  -In January 2024 was related to blood loss anemia but currently not known was still persistent -It is severe  Plan - Check TSH, free T4, vitamin D, CBC, chemistry, liver function test 08/28/2022 - stop pirfenidone for 4-6 weeks and then reassess   Followup - - retunr in 4 to 6 weeks to see [name] or nurse practitioner to reassess fatigue and weight of pirfenidone

## 2022-08-28 NOTE — Progress Notes (Signed)
PCP [name], MD  HPI   IOV 07/10/2017  Chief Complaint  Patient presents with   Advice Only    Referred by [facility] due to SOB.  PFT done 05/28/17.  Pt has been having issues with SOB x4 months especially with exertion and has some mild chest tightness. Denies any cough.    82 year old male referred by [name] for evaluation of shortness of breath after cardiac etiologies ruled out.  He tells me that he is a remote smoker.  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  He has a long-standing history of rheumatoid arthritis followed by Dr. Jefm Bryant in [place].  He is to be on methotrexate for many years and stopped taking it because of cardiac dysfunction [he personally is convinced that methotrexate because this].  He was then on leflunomide as of 2017 but is currently not on it.  His last rheumatoid factor and CCP antibodies were negative on my personal chart review of the outside records in 2016.    Now for the last 3 or 6 months he is got insidious onset of shortness of breath that is slowly progressive.  His dyspnea on exertion relieved by rest.  Class II-3 activities.  There I  s no associated cough or orthopnea proximal nocturnal dyspnea.  He did have some edema but this got cleared up but the dyspnea is continuing to get worse.  In the last few months he has had a cardiac echo that showed pulmonary hypertension.  Did have cardiac stress test that is normal.  Had pulmonary function test that showed isolated reduction in diffusion capacity and therefore he has been referred here.  Walking desaturation test on 07/10/2017 185 feet x 3 laps on ROOM AIR:  did NOT desaturate. Rest pulse ox was 100%, final pulse ox was 98%. HR response 60/min at rest to 121/min at peak exertion. Patient Cameron [name]  Did not Desaturate < 88% . Cameron [name] did not   Desaturated </= 3% points. Gerda Diss Goheen yes did get tachyardic   OV 08/04/2017  Chief Complaint  Patient presents with   Follow-up    ILD    Follow-up interstitial lung disease workup  After the last visit no interim problems.  He has some chronic pedal edema that he will talk to about with his primary care physician.  He did see Dr. Jefm Bryant rheumatologist in July 15, 2017.  I reviewed his notes.  It is not specifically indicate patient has nonspecific seronegative arthritis with a differential diagnosis of seronegative rheumatoid arthritis versus psoriatic.  Patient still seems to think the root of all his problems is the methotrexate he took for over 10 years.  At this point in time there is no decompensation.  As part of the ILD workup his pulmonary function test shows mild reduction in diffusion capacity.  Correlating with this is evidence of pulmonary hypertension on the echo and high-resolution CT chest enlarged pulmonary arteries.  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  His autoimmune panel is negative.  And so the vasculitis panel and hypersensitivity pneumonitis panel.    IMPRESSION: 1. Mild subpleural reticular densities in the posterolateral aspects of both lower lobes are suspicious for mild fibrotic interstitial lung disease such  as nonspecific interstitial pneumonitis. Early/mild usual interstitial pneumonitis is not excluded. 2. Aortic atherosclerosis (ICD10-170.0). Coronary artery calcification. 3. Enlarged pulmonary arteries, indicative of pulmonary arterial Hypertension.- > in echo  Nov 2018: Pulmonary arteries: Systolic pressure was moderately increased.   PA peak pressure: 55 mm Hg (S). 4. Left renal stone, partially imaged.     Electronically Signed   By: Lorin Picket M.D.   On: 07/22/2017 15:03  OV 11/03/2017  Chief Complaint  Patient presents with   Follow-up    Pt  has SOB with exertion, some dry cough.     Follow-up suspected interstitial lung disease in the setting of rheumatoid arthritis and long methotrexate intake  He presents with his wife.  At the time of last visit I was not fully convinced that he had interstitial lung disease.  His CT scan indicated presence of pulmonary hypertension and so did his echocardiogram.  Therefore I referred him back to [name] cardiology.  In the spring 2019 he did have a right heart catheterization and left heart catheterization that showed mild pulmonary hypertension but also coronary artery blockage.  He status post 2 stents.  After that his shortness of breath improved but he tells me overall his fatigue level has not improved.  In talking to him I find out that he exercises 5 times a week doing heart track 2 times a week and the other 3 days walking a mile each time and doing weight lifting.  It appears that he takes a 20-minute nap after these exercises and then feels reenergized.  His mother feels that he does not have the effort tolerance as his younger days but he denies having any symptoms of chest pain or shortness of breath or cough when he does these heavy exertion the weight lifting or walking a mile out doing heart track.  In fact in the walking desaturation test today he walked extremely fast and had no problems.  In terms of his possible interstitial lung disease he had pulmonary function test today and felt to show some improvement and on exam he does not have any crackles.  Also his wife tells me that for the last 2 months he is using CPAP for sleep apnea and this is also helping him.  Right Heart Pressures RHC Procedural Findings: Hemodynamics (mmHg) RA mean 2 RV 42/6 PA 42/8, mean 21 PCWP mean 7 LV 134/8 AO 147/52       OV 06/01/2019  Subjective:  [name], male , DOB: 1940-07-22 , age 82 y.o. , MRN: [id number] , ADDRESS: [place] [address]   06/01/2019 -    Chief Complaint  Patient presents with   Follow-up    Pt states he has been doing okay since last visit but states he has been having a little more SOB x4 weeks now. Pt also has occ coughing with yellow phlegm.   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 -returns for follow-up.  I personally have not seen him since the summer 2019.  He says overall he has been stable.  In August 2020 he had a CT scan of the chest that confirmed the presence of interstitial lung disease in the setting of his rheumatoid arthritis.  He was stable.  His pulmonary function test was stable.  He says now in the last 2 months has had a decline in shortness of breath.  There is also some cough with sputum production but that has resolved.  It is definitely present with exertion but relieved by rest.  His walking desaturation test compared to 18 months ago is roughly the same except that he is very tachycardic.  He does have a pacemaker.  He has an appoint with [name] his electrophysiologist today.  His symptom scores are listed below.     ROS - per HPI     OV 08/01/2019 - telephine visit - identified with 2 person identifier. Telephone visit - limits, risks benefits explained  Subjective:  [name], male , DOB: Sep 24, 1940 , age 6 y.o. 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 - similar dyspnea compared to JAn 2021.  No better nor worse.  After last visit he has seen cardiology x2.  It appears the final conclusion is that diastolic dysfunction might be contributing to his shortness of breath.  But overall not major changes in his cardiac care.  He tells me that overall he is stable.  He had spirometry and DLCO the DLCO itself appears  to be reduced compared to the recent 1 but stable compared to older ones.  The FVC suggest decline.  Patient himself feels stable.  Overall some mixed picture.  Last high-resolution CT chest was October 2020.    IMPRESSION: 1. 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 There is no significant air trapping on expiratory phase imaging. Findings are not significantly changed compared to prior examinations and remain in an "indeterminate for UIP" pattern by ATS pulmonary fibrosis criteria, differential considerations including both UIP and NSIP.   2.  Coronary artery disease and aortic atherosclerosis.   3.  Left nephrolithiasis.     Electronically Signed   By: Eddie Candle M.D.   On: 01/12/2019 15:08    OV 10/17/2019  Subjective:  [name], male , DOB: 1941-04-21 , age 82 y.o. , MRN: [id number] , ADDRESS: [place] [id number]   10/17/2019 -   Chief Complaint  Patient presents with   Follow-up    pt states sobwhen doing activities.   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 -presents for follow-up after having his spirometry and DLCO and high-resolution CT chest.  Overall he feels stable compared to the last visit but he says definitely compared to 2 years ago his symptoms are worse.  Compared to 1 year ago his symptoms are the same.  He is kind of leery of taking new medications.  His high-resolution CT scan of the chest indicates mild progression since 2019 February.  His pulmonary function tests also indicate progression compared to 2 years ago but fluctuant in more recent times.  He is reporting agent orange exposure and  is wondering if his ILD could be related to that.  He reminded me that he is already on the ILD-pro registry study.  His next scheduled visit is in October 2021.   High-resolution CT chest May 2021 Lungs/Pleura: Peripheral and basilar predominant subpleural interlobular and intra  lobular septal thickening and ground-glass. Findings persist on prone imaging and appear similar to 01/12/2019 but may be minimally progressive from 07/22/2017. 4 mm peripheral left lower lobe nodule (14/101), stable from 07/22/2017 and considered benign. Lungs are otherwise clear. No pleural fluid. Airway is unremarkable. Mild air trapping.   Upper Abdomen: Visualized portions of the liver and adrenal glands are unremarkable. Stones are seen in the kidneys bilaterally. Spleen and visualized portions of the pancreas, stomach and bowel are grossly unremarkable. Cholecystectomy. No upper abdominal adenopathy.   Musculoskeletal: Degenerative changes in the spine. No worrisome lytic or sclerotic lesions.   IMPRESSION: 1. Pulmonary parenchymal pattern of fibrosis appears grossly stable from 01/12/2019 but may be minimally progressive from 07/22/2017. 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 Usual interstitial pneumonitis is certainly not excluded. Findings are indeterminate for UIP per consensus guidelines: Diagnosis of Idiopathic Pulmonary Fibrosis: An Official ATS/ERS/JRS/ALAT Clinical Practice Guideline. [place], Iss 5, [id number], Jan 24 2017. 2. Bilateral renal stones. 3. Aortic atherosclerosis (ICD10-I70.0). Coronary artery calcification. 4. Enlarged pulmonic trunk, indicative of pulmonary arterial hypertension.     Electronically Signed   By: Lorin Picket M.D.   On: 10/10/2019 14:00  ROS - per HPI     OV 06/29/2020  Subjective:  [name], male , DOB: 12-13-1940 , age 65 y.o. , MRN: [id number] , [address]: [address] [place] [address] PCP [name], MD Patient Care Team: [name], MD as PCP - General (Unknown Physician Specialty) [name], MD as PCP - Electrophysiology (Cardiology) Troy Sine, MD as PCP - Cardiology (Cardiology) Sueanne Margarita, MD as PCP - Sleep Medicine (Cardiology)  This Provider for this visit: Treatment Team:  Attending Provider: Brand Males, MD    06/29/2020 -   Chief Complaint  Patient presents with   Follow-up    SOB unchanged, slight nonproductive cough. Esbriet doing well.   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 Normal echo Jan 2021   HPI Cameron [name] 82 y.o. -returns for follow-up.  He presents with his wife.  Last seen in May 2021.  After that in June 2021 when he started pirfenidone for progressive ILD.  He tells me that he has been tolerating pirfenidone just fine.  Of note he has not had any liver function test since he started pirfenidone.  He is not having any GI side effects of skin side effects from the pirfenidone.  In the last 6 months his ILD stable according to his history.  His walking desaturation test and symptom scores are stable.  He is up-to-date with his Covid vaccine.  He is on leflunomide.  He is on the ILD-pro registry study with a last visit was in November 2021.  He needs a visit every 6 months.  He is willing to get a Covid IgG checked because he is immunosuppressed.  This is in response to humoral immunity to vaccine     OV 04/09/2021  Subjective:  [name], male , DOB: 1941-02-22 , age 3 y.o. 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 Normal echo Jan 2021  HPI Cameron [name] 82 y.o. -returns for follow-up.  He tells me that in the last 6 months since his last visit he is having progressively more shortness of breath with exertion relieved by rest.  In fact his dyspnea score shows worsening to a total score of 9 compared to earlier in the year.  But there is no worsening cough.  He had pulmonary function test that shows continued stability since 2021 feb but definitely worse since 2019 and 2020.  In other words the stability correlates with him starting pirfenidone.  Explained to him that the pirfenidone is helping his ILD stability.  Nevertheless his symptoms are worse.  He had echocardiogram yesterday that shows continued elevation in pulmonary artery pressures [2019 borderline elevation] and grade 2 diastolic dysfunction.  Of note he had his ILD-Pro registry research visit today. Last liver function test for drug-induced liver injury monitoring was in February 2022.  GFR at that time was 57.  In March 2022.  His wife has suspected ILD and she is also with him in this visit.  I saw her for a scheduled visit just earlier.      OV  06/13/2021  Subjective:  [name], male , DOB: June 15, 1940 , age 2 y.o. , MRN: [id number] , ADDRESS: [place] [address] PCP [name], MD Patient Care Team: [name], MD as PCP - General (Unknown Physician Specialty) [name], MD as PCP - Electrophysiology (Cardiology) Troy Sine, MD as PCP - Cardiology (Cardiology) Sueanne Margarita, MD as PCP - Sleep Medicine (Cardiology)  This Provider for this visit: Treatment Team:  Attending Provider: Brand Males, MD    06/13/2021 -   Chief Complaint  Patient presents with   Follow-up    Pt is following up after recent heart cath.    HPI DERAL SCHELLENBERG 82 y.o. -returns for follow-up to discuss the implications of the right heart cath results.  He had right heart cath on 05/29/2021.  Around this time he also had some mild anemia that is documented below.  But his recent creatinine is normal.  His right heart cath shows worsening of mean arterial pressure compared to a few years ago but his PVR is low and his wedge is high.  He also has moderate mitral regurgitation and had a high V wave.  Compared to last visit he stable but over time he said worsening dyspnea.  He is here with his wife.  I told them both that this looks like pulmonary venous hypertension.  He does not have edema.  We discussed the possibility of doing Lasix  therapy.  I told him he does not meet official criteria for inhaled treprostinil and WHO group 3 pulmonary hypertension.  For inhaled treprostinil he would need a wedge less than 15 and a PVR greater than 3.  He does not have that.  He is agreed to do Lasix at this point.  He will have to follow-up with Dr. [name] for this.  In terms of his interstitial lung disease he has a surveillance CT scan coming up in March 2023 [last CT was then May 2021].  I think we will postpone this and just see him in 4 to 6 months for ILD follow-up given recent stability and  pulmonary function test.  What he needs is follow-up for his shortness of breath and to see how the Lasix is performing.  For this we will make a nurse practitioner visit and also encouraged him to go and see Dr. Claiborne Billings.  CT Chest data  No results found.   07/11/2021: Today - follow up Patient presents today with wife via virtual visit for follow up after being started on lasix for mild pulmonary HTN. He states he started the medication but has since stopped due to some bladder issues. He reports his breathing has been stable and overall, he feels pretty well. He denies any lower extremity swelling, palpitations, orthopnea, PND, cough or wheezing. He is doing well on Esbriet. He has developed a rash over the last 3-4 days after being transitioned from metformin to [place]. He denies any facial or tongue swelling, difficulties breathing or swallowing, or N/V.     OV 10/01/2021  Subjective:  [name], male , DOB: 1941/04/07 , age 82 y.o. , MRN: [id number] , ADDRESS: [place] [address] PCP [name], MD Patient Care Team: [name], MD as PCP - General (Unknown Physician Specialty) [name], MD as PCP - Electrophysiology (Cardiology) Troy Sine, MD as PCP - Cardiology (Cardiology) Sueanne Margarita, MD as PCP - Sleep Medicine (Cardiology)  This Provider for this visit: Treatment Team:  Attending Provider: Brand Males, MD    10/01/2021 -   Chief Complaint  Patient presents with   Follow-up    PFT performed today.  Pt states he has been doing good since last visit and denies any complaints.    HPI KINGSLY KLOEPFER 82 y.o. -returns for routine follow-up.  After I saw him in January 2023 we gave him a trial of Lasix because of diastolic dysfunction and pulmonary venous hypertension but this did not help.  He denied acute video visit in March 2023 because of COVID-19.  He took antiviral.  Currently doing well except for  the fact that he has had increased fatigue compared to baseline [the symptom score].  The fatigue is significant especially by the afternoon.  Also for the last few weeks he has had diarrhea that is moderate in intensity.  There is no changes in his medications.  Over time his creatinine has gotten worse.  He does have chronic kidney disease and is seen his primary care.  He also tells me that he was taking potassium and recently has had hyperkalemia.  This was getting worse as of yesterday with a potassium of 6.1 mEq.  This was at outside facility and I was able to review the lab results.  His primary care is intently watching this.  He is off his diuretics and potassium supplementation.  I have instructed him to have  repeat potassium today.  In terms of his ILD it is stable.  Pulmonary function tests are stabilized.  His CT scan of the chest was reviewed and this shows a stable ILD in the last 2 years.  Unclear to me if he is still on leflunomide or not. He is supposed have a research registry follow-up visit today.  Creatinine profile shows a slow worsening over time.          OV 11/27/2021  Subjective:  [name], male , DOB: Nov 18, 1940 , age 56 y.o. , MRN: [id number] , ADDRESS: [place] [id number] PCP [name], MD Patient Care Team: [name], MD as PCP - General (Unknown Physician Specialty) [name], MD as PCP - Electrophysiology (Cardiology) Troy Sine, MD as PCP - Cardiology (Cardiology) Sueanne Margarita, MD as PCP - Sleep Medicine (Cardiology)  This Provider for this visit: Treatment Team:  Attending Provider: Brand Males, MD    11/27/2021 -   Chief Complaint  Patient presents with   Follow-up    Pt to discuss Esbriet. Pt still doing good since LOV.    HPI Cameron [name] 82 y.o. -returns for follow-up.  At the last visit in May 2023 because of rising creatinine we reduce his pirfenidone dose  particularly in the setting of fatigue.  It seems lowering the pirfenidone dose does not really help the fatigue but it does seem to overall make him feel better.  He is stable.  His dyspnea is better.  His diarrhea is very minimal to nonexistent.  He had pulmonary function test and this shows continued stability since being on pirfenidone.  He had ILD-Pro registry visit in the spring 2023.  His next visit is due sometime around October 2023.  We discussed his rising creatinine.  I advised him to stay on the lower dose of the pirfenidone.  This would be 2 pills 3 times daily.  He did indicate and his wife also indicated that he is having difficult time controlling his hyperkalemia.  Review of his medication shows that he is on ARB s [telmisartan].  Discussed this is a potential contributory etiology for his hyperkalemia.  He is willing to get his creatinine and his potassium checked today in the office.  But his primary care is the one who is managing that.   Noted he is on fish oil but he says this actually helps him with his arthritis.     OV 05/06/2022  Subjective:  [name], male , DOB: [birth year] , age 33 y.o. , MRN: [id number] , ADDRESS: [place] [id number] PCP [name], MD Patient Care Team: [name], MD as PCP - General (Unknown Physician Specialty) [name], MD as PCP - Electrophysiology (Cardiology) Troy Sine, MD as PCP - Cardiology (Cardiology) Sueanne Margarita, MD as PCP - Sleep Medicine (Cardiology) [place], Tami Lin, [place] as Physician Assistant (Cardiology)  This Provider for this visit: Treatment Team:  Attending Provider: Brand Males, MD    05/06/2022 -   Chief Complaint  Patient presents with   Follow-up    Pt states he has been doing okay since last visit and denies any complaints.     Marland Kitchen  HPI Cameron [name] 81 y.o. -returns for follow-up.  Presents with his wife.  He feels he is doing  stable.  Symptom score shows stability but to me he obviously looks  like he has lost weight.  He then told me that ever since COVID earlier in the year he is having dysgeusia and low appetite and therefore he is losing weight.  Review of the records indicate he is lost 20 pounds in the last 1 year.  I did indicate to him that concern of COVID causing his dysgeusia is pirfenidone could be causing his dysgeusia and low appetite and weight loss.  He is willing to give a short holiday and reassess.  He has not had pulmonary function test recently but he feels stable.  He is due for monitoring safety labs with his pirfenidone high risk medication.  He continues on immunosuppression.      OV 08/28/2022  Subjective:  [name], male , DOB: 1940-08-16 , age 50 y.o. 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 Pn med list May 2023  = Started pirfenidone June 2021  - ILD-Pro registry stdy  - last visit 04/09/2021  - Last HRCT May 2021 -. May 2023 without change (indeterminate)  Abnormal Echo - Gr 2 ddx Nov 2022   - ef 60%  - Gr 2 ddx  - RVSP 54  - Mitral and Tricuspid Mod regurg   RHC 05/29/21 - Mild pulmonary hypertension in this patient with interstitial lung disease with elevated RV  54/12; PA pressure 55/18; mean 31 mmHg.  PVR 2.6 WU. PW mean pressure is 17 mmHg, with V wave 24 c/w his echo documented mild - moderate mitral regurgitation.   CKD -  -07/13/2019: Creatinine 1.2 mg percent -05/23/2021 creatinine 1.15 mg percent  - 06/13/21 creat 1.6mg %, GFR 39 - 09/30/21 creat 1.34m GFR 42 - 10/16/21 - creat 1.84mg % - GFR 34 -11/27/2021: Creatinine 1.31 mg percent\\ - 05/30/22 - cret 0.95mg %  Mild COVID early 2023  Admission for blood loss anemia -without identifiable source in January 2024 status post 3 units of blood  HPI SHALEV RIDENBAUGH 82 y.o. -last seen in December 2023.  At that time he was having dysgeusia after COVID.  Also having weight loss.  We do not know if pirfenidone was involved so told him to stop it for a few weeks which she did but it did not really resolve his symptoms.  He presents for follow-up but it appears in January 2024 [actually between 05/23/2022 - 05/30/2022] he was admitted with melena and upper GI bleed got 3 units of blood.  Extensive endoscopy colonoscopy and camera test were all negative.  Since then they have been monitoring his hemoglobin at home and it is improved.  He did follow-up with Dr. Colleen Can on 07/01/2022 at GI clinic.  He states ever since then he feels a lot more fatigue despite improvement in hemoglobin he is more short of breath.  His mood is also low.  He has been started by the [facility] yesterday/3/24 and Zoloft but he denies being sad.  Although depression has been diagnosed.  Definitely not suicidal.  He is unable to understand his fatigue.  Wife also affirms the same.  Review of his labs included 07/01/2022 low vitamin D but is not on supplements and abnormal TSH in 2018 which she believes has not been rechecked.  He believes he is lost weight but according to our indicators his weight is stable  His last echocardiogram was in November 2022 And his last high-resolution CT chest was in May 2023    SYMPTOM SCALE - ILD  10/17/2019 Started pirfenidone June 2021 06/29/2020  04/09/2021 193# 10/01/2021 Esbriet lower dose since may 2023  186# 05/06/2022 Esbiret lowe rdose due to CKD  173# 08/28/2022 177# Esbiret low dose  O2 use ra ra ra ra ra ra  Shortness of Breath 0 -> 5 scale with 5 being worst (score 6 If unable to do)       At rest 0 0 0 0 1 2  Simple tasks - showers, clothes change, eating, shaving 1 1 0 0 0 2  Household (dishes, doing bed, laundry) 1 0 0 0 0 2  Shopping 0 0 1 0 1 2  Walking level at own pace 2 0 4 0 2 3  Walking up Stairs 3 3 4 3 3 4   Total (30-36) Dyspnea Score 7 4 9 3 7 15   How bad is your cough? x 1  0 2 0  How bad is your fatigue 4 2 2 4 2 5   How bad is nausea 0 0 0 0 0 2  How bad is vomiting?  0 0 0 0 0 0  How bad is diarrhea? 1 0 0 0.5 0 5  How bad is anxiety? 1 1 1 2 1 2   How bad is depression 1 1  1 1 1 4         Simple office walk 185 feet x  3 laps goal with forehead probe 11/03/2017  06/01/2019  06/29/2020  04/09/2021  10/01/2021   O2 used Room air Room air  ra ra  Number laps completed 3 3  3    Comments about pace Fast very  99% and 66     Resting Pulse Ox/HR 100% and 70/min 99% and 66/min 97% and 71 100% and Pulse 77 100% and  HR 67  Final Pulse Ox/HR 98% and 121/min 98% and 120 min 98% and 121/min 99% and HR 114 97% and HR 112  Desaturated </= 88% no    no  Desaturated <= 3% points no    Yes 3 pnts  Got Tachycardic >/= 90/min yes    yes  Symptoms at end of test none Mild dyspea No dyspnea Dyspnea at end Noe copmpaint  Miscellaneous comments veryu fast Mod pace fast Fast pace moderate      PFT     Latest Ref Rng & Units 08/25/2022   10:58 AM 10/01/2021   10:03 AM 04/17/2020    9:34 AM 10/13/2019    3:53 PM 07/21/2019    3:53 PM 01/17/2019   12:39 PM 11/03/2017    2:10 PM  PFT Results  FVC-Pre L 2.80  P 3.27  3.05  3.08  2.92  3.41  3.59   FVC-Predicted Pre % 81  P 93  86  86  81  94  98   Pre FEV1/FVC % % 84  P 84  81  81  82  82  83   FEV1-Pre L 2.34   P 2.75  2.46  2.49  2.40  2.78  2.98   FEV1-Predicted Pre % 97  P 112  98  98  94  107  114   DLCO uncorrected ml/min/mmHg 20.12  P 18.84  19.38  19.58  20.08  21.58  20.97   DLCO UNC% % 91  P 85  87  87  89  96  74   DLCO corrected ml/min/mmHg 22.73  P 18.63  19.38  19.58  20.08     DLCO COR %Predicted % 103  P 84  87  87  89     DLVA Predicted % 141  P 92  105  101  112  104  89     P Preliminary result       has a past medical history of Anemia, Appendicitis, Atrial fibrillation (12/12/2013), BPH (benign prostatic hypertrophy), CAD (coronary artery disease) (12/2015), Chest pain (12/03/2015), CHF (congestive heart failure), Complete heart block, Coronary artery disease, Coronary artery disease involving native coronary artery of native heart with unstable angina pectoris (08/11/2017), D-dimer, elevated (04/03/2017), Drug reaction (07/11/2021), History of blood transfusion (1968), History of kidney stones, History of SCC (squamous cell carcinoma) of skin (07/24/2020), Hyperglycemia (11/05/2013), Hyperlipidemia (11/05/2013), Hypertension, Hypothyroidism, Hypothyroidism, unspecified (11/05/2013), 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   reports that he quit smoking about 50 years ago. His smoking use included cigarettes. He has a 5.00 pack-year smoking history. He quit smokeless tobacco use about 49 years ago.  His smokeless tobacco use included chew.  Past Surgical History:  Procedure Laterality Date   BACK SURGERY     BALLOON DILATION N/A 05/24/2022   Procedure: BALLOON DILATION;  Surgeon: Sharyn Creamer, MD;  Location: [place];  Service: Gastroenterology;  Laterality: N/A;   CARDIAC CATHETERIZATION N/A 12/28/2015   Procedure: Left Heart Cath and Coronary Angiography;   Surgeon: Belva Crome, MD;  Location: [place] [facility];  Service: Cardiovascular;  Laterality: N/A;   CATARACT EXTRACTION W/ INTRAOCULAR LENS  IMPLANT, BILATERAL Bilateral    COLONOSCOPY WITH PROPOFOL N/A 05/25/2022   Procedure: COLONOSCOPY WITH PROPOFOL;  Surgeon: Sharyn Creamer, MD;  Location: [place];  Service: Gastroenterology;  Laterality: N/A;   CORONARY ANGIOPLASTY WITH STENT PLACEMENT  08/11/2017   "2 stents"   CORONARY STENT INTERVENTION N/A 08/11/2017   Procedure: CORONARY STENT INTERVENTION;  Surgeon: Troy Sine, MD;  Location: [place] [facility];  Service: Cardiovascular;  Laterality: N/A;   CYSTOSCOPY W/ STONE MANIPULATION     ESOPHAGOGASTRODUODENOSCOPY (EGD) WITH PROPOFOL N/A 05/24/2022   Procedure: ESOPHAGOGASTRODUODENOSCOPY (EGD) WITH PROPOFOL;  Surgeon: Sharyn Creamer, MD;  Location: [place];  Service: Gastroenterology;  Laterality: N/A;   GIVENS CAPSULE STUDY N/A 05/25/2022   Procedure: GIVENS CAPSULE STUDY;  Surgeon: Sharyn Creamer, MD;  Location: [place];  Service: Gastroenterology;  Laterality: N/A;   INGUINAL HERNIA REPAIR Left    INSERT / REPLACE / REMOVE PACEMAKER  12/09/2011   SJM Accent DR RF implanted by DR Allred for complete heart block and syncope   JOINT REPLACEMENT     LAPAROSCOPIC CHOLECYSTECTOMY     LEAD REVISION/REPAIR N/A 08/09/2020   Procedure: LEAD REVISION/REPAIR;  Surgeon: [name], MD;  Location: [place] [facility];  Service: Cardiovascular;  Laterality:  N/A;   LITHOTRIPSY     LUMBAR DISC SURGERY     "removed arthritis and spurs"   PERMANENT PACEMAKER INSERTION N/A 12/09/2011   Procedure: PERMANENT PACEMAKER INSERTION;  Surgeon: [name], MD;  Location: [facility];  Service: Cardiovascular;  Laterality: N/A;   PPM GENERATOR CHANGEOUT N/A 08/09/2020   Procedure: PPM GENERATOR CHANGEOUT;  Surgeon: [name], MD;  Location: [place] [facility];  Service: Cardiovascular;  Laterality: N/A;   REPLACEMENT TOTAL KNEE Right     RIGHT HEART CATH N/A 05/29/2021   Procedure: RIGHT HEART CATH;  Surgeon: Troy Sine, MD;  Location: [place] [facility];  Service: Cardiovascular;  Laterality: N/A;   RIGHT/LEFT HEART CATH AND CORONARY ANGIOGRAPHY N/A 08/11/2017   Procedure: RIGHT/LEFT HEART CATH AND CORONARY ANGIOGRAPHY;  Surgeon: Larey Dresser, MD;  Location: [place] [facility];  Service: Cardiovascular;  Laterality: N/A;   TRANSURETHRAL RESECTION OF PROSTATE  2017/2018    Allergies  Allergen Reactions   Ace Inhibitors Swelling    Tongue swelling, angioedema   Lisinopril     Other reaction(s): Lip swelling, O/E - lip swelling   Celecoxib Rash    Skin rash     Immunization History  Administered Date(s) Administered   Fluad Quad(high Dose 65+) 02/11/2020   Influenza Inj Mdck Quad Pf 03/25/2021   Influenza, High Dose Seasonal PF 03/26/2017, 02/11/2022   Influenza-Unspecified 05/03/2015, 02/13/2016, 03/26/2016, 03/19/2017, 03/03/2018, 03/17/2019, 04/09/2020, 04/18/2021   PFIZER Comirnaty(Gray Top)Covid-19 Tri-Sucrose Vaccine 06/11/2019, 07/02/2019, 07/04/2019, 07/18/2019, 03/01/2020, 11/15/2020   PFIZER(Purple Top)SARS-COV-2 Vaccination 06/11/2019, 07/04/2019, 07/18/2019, 03/01/2020   Pneumococcal Conjugate-13 02/11/2020   Pneumococcal Polysaccharide-23 12/10/2011, 01/30/2018   Pneumococcal-Unspecified 03/26/2016, 02/04/2018   Tdap 01/17/2016, 02/04/2018   Zoster Recombinat (Shingrix) 08/14/2020, 11/29/2020    Family History  Problem Relation Age of Onset   Pancreatic cancer Mother    Bone cancer Father    Alzheimer's disease Sister    Heart attack Paternal Uncle    Arthritis Maternal Grandfather    Alzheimer's disease Paternal Grandmother    Lung cancer Paternal Grandfather      Current Outpatient Medications:    acetaminophen (TYLENOL) 500 MG tablet, Take 500 mg by mouth every 6 (six) hours as needed for moderate pain., Disp: , Rfl:    allopurinol (ZYLOPRIM) 300 MG tablet, Take 300 mg by mouth  daily., Disp: , Rfl:    apixaban (ELIQUIS) 5 MG TABS tablet, Take 1 tablet (5 mg total) by mouth 2 (two) times daily., Disp: 180 tablet, Rfl: 3   docusate sodium (COLACE) 100 MG capsule, Take 100 mg by mouth 2 (two) times daily., Disp: , Rfl:    nebivolol (BYSTOLIC) 5 MG tablet, Take 5 mg by mouth daily., Disp: , Rfl:    nitroGLYCERIN (NITROSTAT) 0.4 MG SL tablet, Place 1 tablet (0.4 mg total) under the tongue every 5 (five) minutes as needed for chest pain., Disp: 75 tablet, Rfl: 2   Omega-3 Fatty Acids (OMEGA-3 FISH OIL PO), Take 1 capsule by mouth daily., Disp: , Rfl:    pantoprazole (PROTONIX) 40 MG tablet, Take 40 mg by mouth daily., Disp: , Rfl:    Pirfenidone (ESBRIET) 267 MG TABS, Take 2 tablets (534 mg total) by mouth 3 (three) times daily with meals. **NOTE LOW DOSE due to kidney function**, Disp: 180 tablet, Rfl: 5   pravastatin (PRAVACHOL) 20 MG tablet, Take 1 tablet (20 mg total) by mouth every evening., Disp: 90 tablet, Rfl: 3   telmisartan (MICARDIS) 80 MG tablet, Take 1  tablet (80 mg total) by mouth daily., Disp: 90 tablet, Rfl: 3   thyroid (ARMOUR) 90 MG tablet, Take 90 mg by mouth every morning. , Disp: , Rfl:    Trospium Chloride 60 MG CP24, TAKE 1 CAPSULE BY MOUTH DAILY (Patient taking differently: Take 1 capsule by mouth daily.), Disp: 30 capsule, Rfl: 2   VTAMA 1 % CREA, APPLY 1 APPLICATION TOPICALLY DAILY, Disp: 60 g, Rfl: 5  Current Facility-Administered Medications:    sodium chloride flush (NS) 0.9 % injection 3 mL, 3 mL, Intravenous, Q12H, Troy Sine, MD  Facility-Administered Medications Ordered in Other Visits:    ondansetron (ZOFRAN) 4 mg in sodium chloride 0.9 % 50 mL IVPB, 4 mg, Intravenous, Q6H PRN, Ashok Pall, MD      Objective:   Vitals:   08/28/22 1539  BP: (!) 140/74  Pulse: 70  SpO2: 98%  Weight: 177 lb 3.2 oz (80.4 kg)  Height: 5\' 7"  (1.702 m)    Estimated body mass index is 27.75 kg/m as calculated from the following:   Height as of  this encounter: 5\' 7"  (1.702 m).   Weight as of this encounter: 177 lb 3.2 oz (80.4 kg).  @WEIGHTCHANGE @  [employer]   08/28/22 1539  Weight: 177 lb 3.2 oz (80.4 kg)     Physical Exam    General: No distress. Look ok but more frail and deconditoned Neuro: Alert and Oriented x 3. GCS 15. Speech normal Psych: Pleasant Resp:  Barrel Chest - no.  Wheeze - no, Crackles - yes mild, No overt respiratory distress CVS: Normal heart sounds. Murmurs - no Ext: Stigmata of Connective Tissue Disease - no but has RA HEENT: Normal upper airway. PEERL +. No post nasal drip        Assessment:       ICD-10-CM   1. ILD (interstitial lung disease)  J84.9 CBC with Differential    Basic Metabolic Panel (BMET)    Basic Metabolic Panel (BMET)    CBC with Differential    2. Rheumatoid arthritis with negative rheumatoid factor, involving unspecified site  M06.00     3. Encounter for drug therapy  Z79.899     4. H/O agent Orange exposure  Z77.098     5. Dysgeusia  R43.2     6. Unintentional weight loss  R63.4     7. Other fatigue  R53.83 T4, free    Vitamin D (25 hydroxy)    CBC with Differential    Basic Metabolic Panel (BMET)    Hepatic function panel    Hepatic function panel    Basic Metabolic Panel (BMET)    CBC with Differential    Vitamin D (25 hydroxy)    T4, free    8. Vitamin D deficiency  E55.9 Vitamin D (25 hydroxy)    Vitamin D (25 hydroxy)         Plan:     Patient Instructions  Interstitial lung disease due to connective tissue disease ([place]) History of rheumatoid arthritis and immunosuppressed on leflunomide Hx of Agent Orange Exposure   - Pulmonary Fibrosis  overall progressive. REcently stable.  Hx of covid March 2023 - did not affect your fibrosis.  Currently having significant fatigue  Plan - - take esbriet holiday for 4-6 weeks and see if weight and taste and fatigue improve -Addendum done after he left clinic: Will get CT scan of the chest  high-resolution [1 year follow-up in] and echocardiogram   Dysgeusia Unintentional weight loss  -  Could be because of pirfenidone v post covid. Not resolved despite prior pirfenidone holiday and despite coming off [place] - Repeat pirfenidone holiday but this time for 4-6 weeks and then reassess  Fatigue  -In January 2024 was related to blood loss anemia but currently not known was still persistent -It is severe  Plan - Check TSH, free T4, vitamin D, CBC, chemistry, liver function test 08/28/2022 - stop pirfenidone for 4-6 weeks and then reassess   Followup - - retunr in 4 to 6 weeks to see [name] or nurse practitioner to reassess fatigue and weight of pirfenidone  ( Level 05 visit: Estb 40-54 min   in  visit type: on-site physical face to visit  in total care time and counseling or/and coordination of care by this undersigned MD - Dr Brand Males. This includes one or more of the following on this same day 08/28/2022: pre-charting, chart review, note writing, documentation discussion of test results, diagnostic or treatment recommendations, prognosis, risks and benefits of management options, instructions, education, compliance or risk-factor reduction. It excludes time spent by the [facility] [place] or office staff in the care of the patient. Actual time 40 min)  SIGNATURE    Dr. Brand Males, M.D., F.C.C.P,  Pulmonary and Critical Care Medicine Staff Physician, [place] Director - Interstitial Lung Disease  Program  Pulmonary [place] at [place], [place], [address]  Pager: [phone], If no answer or between  15:00h - 7:00h: call [phone] Telephone: [phone]  6:19 PM 08/28/2022

## 2022-08-28 NOTE — Telephone Encounter (Signed)
Eddie Dibbles -please get high-resolution CT chest supine and prone is 1 year follow-up and also echocardiogram.  Both to be completed before he returns to see me or nurse practitioner in the next 4 weeks  Dx ILD and DOE    SIGNATURE    Dr. Brand Males, M.D., F.C.C.P,  Pulmonary and Critical Care Medicine Staff Physician, [place] Director - Interstitial Lung Disease  Program  Medical Director - [facility] Pulmonary [place] at [place], [place], [address]   Pager: [phone], If no answer  -[place] or [place] [phone] Telephone (clinical office): [phone] Telephone (research): [phone]  6:20 PM 08/28/2022'g

## 2022-08-29 LAB — HEPATIC FUNCTION PANEL
ALT: 29 U/L (ref 0–53)
AST: 25 U/L (ref 0–37)
Albumin: 3.8 g/dL (ref 3.5–5.2)
Alkaline Phosphatase: 193 U/L — ABNORMAL HIGH (ref 39–117)
Bilirubin, Direct: 0.2 mg/dL (ref 0.0–0.3)
Total Bilirubin: 0.5 mg/dL (ref 0.2–1.2)
Total Protein: 6.7 g/dL (ref 6.0–8.3)

## 2022-08-29 LAB — BASIC METABOLIC PANEL
BUN: 18 mg/dL (ref 6–23)
CO2: 26 mEq/L (ref 19–32)
Calcium: 9.2 mg/dL (ref 8.4–10.5)
Chloride: 106 mEq/L (ref 96–112)
Creatinine, Ser: 0.96 mg/dL (ref 0.40–1.50)
GFR: 73.66 mL/min (ref >60.00)
Glucose, Bld: 160 mg/dL — ABNORMAL HIGH (ref 70–99)
Potassium: 3.9 mEq/L (ref 3.5–5.1)
Sodium: 138 mEq/L (ref 135–145)

## 2022-08-29 LAB — CBC WITH DIFFERENTIAL/PLATELET
Basophils Absolute: 0.1 10*3/uL (ref 0.0–0.1)
Basophils Relative: 1.4 % (ref 0.0–3.0)
Eosinophils Absolute: 0.3 10*3/uL (ref 0.0–0.7)
Eosinophils Relative: 3.3 % (ref 0.0–5.0)
HCT: 36 % — ABNORMAL LOW (ref 39.0–52.0)
Hemoglobin: 11.7 g/dL — ABNORMAL LOW (ref 13.0–17.0)
Lymphocytes Relative: 18.5 % (ref 12.0–46.0)
Lymphs Abs: 1.7 10*3/uL (ref 0.7–4.0)
MCHC: 32.4 g/dL (ref 30.0–36.0)
MCV: 85.9 fl (ref 78.0–100.0)
Monocytes Absolute: 0.9 10*3/uL (ref 0.1–1.0)
Monocytes Relative: 9.5 % (ref 3.0–12.0)
Neutro Abs: 6.2 10*3/uL (ref 1.4–7.7)
Neutrophils Relative %: 67.3 % (ref 43.0–77.0)
Platelets: 202 10*3/uL (ref 150.0–400.0)
RBC: 4.2 Mil/uL — ABNORMAL LOW (ref 4.22–5.81)
RDW: 18.4 % — ABNORMAL HIGH (ref 11.5–15.5)
WBC: 9.3 10*3/uL (ref 4.0–10.5)

## 2022-08-29 LAB — VITAMIN D 25 HYDROXY (VIT D DEFICIENCY, FRACTURES): VITD: 32.05 ng/mL (ref 30.00–100.00)

## 2022-08-29 LAB — T4, FREE: Free T4: 0.72 ng/dL (ref 0.60–1.60)

## 2022-08-29 NOTE — Progress Notes (Signed)
Anemia continues to get better. Rest labs ok

## 2022-08-29 NOTE — Telephone Encounter (Signed)
Called and spoke with pt's spouse letting her know the info per MR and she verbalized understanding. HRCT and echo have been ordered. Nothing further needed.

## 2022-09-01 ENCOUNTER — Encounter: Payer: Medicare Other | Admitting: Internal Medicine

## 2022-09-01 DIAGNOSIS — J849 Interstitial pulmonary disease, unspecified: Secondary | ICD-10-CM

## 2022-09-01 DIAGNOSIS — Z006 Encounter for examination for normal comparison and control in clinical research program: Secondary | ICD-10-CM

## 2022-09-01 NOTE — Research (Signed)
Title: Chronic Fibrosing Interstitial Lung Disease with Progressive Phenotype Prospective Outcomes (ILD-PRO) Registry    Protocol #: IPF-PRO-SUB, Clinical Trials # [id number], Sponsor: [employer]   Protocol Version Amendment 4 dated 12Sep2019  and confirmed current on  Consent Version for today's visit date of  Is [place] IRB Approved Version 29 Apr 2018 Revised 29 Apr 2018   Objectives:  Describe current approaches to diagnosis and treatment of chronic fibrosing ILDs with progressive phenotype  Describe the natural history of chronic fibrosing ILDs with progressive phenotype  Assess quality of life from self-administered participant reported questionnaires for each disease group  Describe participant interactions with the healthcare system, describe treatment practices across multiple institutions for each disease group  Collect biological samples linked to well characterized chronic fibrosing ILDs with progressive phenotype to identify disease biomarkers  Collect data and biological samples that will support future research studies.                                            Key Inclusion Criteria: Willing and able to provide informed consent  Age ? 30 years  Diagnosis of a non-IPF ILD of any duration, including, but not limited to Idiopathic Non-Specific Interstitial Pneumonia (INSIP), Unclassifiable Idiopathic Interstitial Pneumonias (IIPs), Interstitial Pneumonia with Autoimmune Features (IPAF), Autoimmune ILDs such as Rheumatoid Arthritis (RA-ILD) and Systemic Sclerosis (SSC-ILD), Chronic Hypersensitivity Pneumonitis (HP), Sarcoidosis or Exposure-related ILDs such as asbestosis.  Chronic fibrosing ILD defined by reticular abnormality with traction bronchiectasis with or without honeycombing confirmed by chest HRCT scan and/or lung biopsy.  Progressive phenotype as defined by fulfilling at least one of the criteria below of fibrotic changes (progression set point)  within the last 24 months regardless of treatment considered appropriate in individual ILDs:  decline in FVC % predicted (% pred) based on >10% relative decline  decline in FVC % pred based on ? 5 - <10% relative decline in FVC combined with worsening of respiratory symptoms as assessed by the site investigator  decline in FVC % pred based on ? 5 - <10% relative decline in FVC combined with increasing extent of fibrotic changes on chest imaging (HRCT scan) as assessed by the site investigator  decline in DLCO % pred based on ? 10% relative decline  worsening of respiratory symptoms as well as increasing extent of fibrotic changes on chest imaging (HRCT scan) as assessed by the site investigator independent of FVC change.     Key Exclusion Criteria: Malignancy, treated or untreated, other than skin or early stage prostate cancer, within the past 5 years  Currently listed for lung transplantation at the time of enrollment  Currently enrolled in a clinical trial at the time of enrollment in this registry       Clinical Research Coordinator / Research RN note : This visit for Cameron [name]  Subject [id number] with DOB:1941/01/03  on 08/Apr/2024  for the above protocol is Visit/Encounter 6 and is for purpose of research.    Subject expressed continued interest and consent in continuing as a study subject. Subject confirmed that there was no change in contact information (e.g. address, telephone, email). Subject thanked for participation in research and contribution to science.     During this visit on 08/Apr/2024  , the subject completed the blood work and questionnaires per the above referenced protocol. Please refer to the subject's paper source binder for further  details.   Signed by Loma NewtonSchae Sherwood Castilla Clinical Research Coordinator [facility]  [place] 08/Apr/2024  3pm

## 2022-09-03 ENCOUNTER — Telehealth: Payer: Self-pay | Admitting: *Deleted

## 2022-09-03 NOTE — Telephone Encounter (Signed)
Patient wife called reporting that he has an appointment tomorrow for consult and that he is not doing well at all and does not know that he can wait until tomorrow as he is lethargic and not eating or drinking much. She is asking if there is any way he can be seen today, if not he will keep tomorrows appointment at [phone]

## 2022-09-03 NOTE — Telephone Encounter (Signed)
Call returned to wife and advised that Dr Orlie Dakin is not in clinic this afternoon so he cannot be seen today. I advised ER if he is not ablre to wait until tomorrow, but she declined not wanting to go to ER unless he absolutely has to so they will wait for appointment tomorrow and will only go top ER if absolutely has to

## 2022-09-04 ENCOUNTER — Inpatient Hospital Stay: Payer: Medicare Other

## 2022-09-04 ENCOUNTER — Encounter: Payer: Self-pay | Admitting: Oncology

## 2022-09-04 ENCOUNTER — Ambulatory Visit
Admission: RE | Admit: 2022-09-04 | Discharge: 2022-09-04 | Disposition: A | Discharge status: Home / Self Care | Payer: Medicare Other | Source: Ambulatory Visit | Attending: Internal Medicine | Admitting: Internal Medicine

## 2022-09-04 ENCOUNTER — Inpatient Hospital Stay: Payer: Medicare Other | Attending: Oncology | Admitting: Oncology

## 2022-09-04 ENCOUNTER — Other Ambulatory Visit: Payer: Self-pay

## 2022-09-04 VITALS — BP 143/62 | HR 70 | Temp 97.0°F | Resp 16 | Ht 67.0 in | Wt 170.0 lb

## 2022-09-04 DIAGNOSIS — R5382 Chronic fatigue, unspecified: Secondary | ICD-10-CM | POA: Diagnosis not present

## 2022-09-04 DIAGNOSIS — D509 Iron deficiency anemia, unspecified: Secondary | ICD-10-CM | POA: Insufficient documentation

## 2022-09-04 DIAGNOSIS — D649 Anemia, unspecified: Secondary | ICD-10-CM

## 2022-09-04 DIAGNOSIS — R0609 Other forms of dyspnea: Secondary | ICD-10-CM | POA: Insufficient documentation

## 2022-09-04 DIAGNOSIS — J849 Interstitial pulmonary disease, unspecified: Secondary | ICD-10-CM | POA: Insufficient documentation

## 2022-09-04 LAB — FOLATE: Folate: 21.2 ng/mL (ref >5.9)

## 2022-09-04 LAB — IRON AND TIBC
Iron: 34 ug/dL — ABNORMAL LOW (ref 45–182)
Saturation Ratios: 8 % — ABNORMAL LOW (ref 17.9–39.5)
TIBC: 442 ug/dL (ref 250–450)
UIBC: 408 ug/dL

## 2022-09-04 LAB — CBC (CANCER CENTER ONLY)
HCT: 36.9 % — ABNORMAL LOW (ref 39.0–52.0)
Hemoglobin: 11.7 g/dL — ABNORMAL LOW (ref 13.0–17.0)
MCH: 27.6 pg (ref 26.0–34.0)
MCHC: 31.7 g/dL (ref 30.0–36.0)
MCV: 87 fL (ref 80.0–100.0)
Platelet Count: 252 10*3/uL (ref 150–400)
RBC: 4.24 MIL/uL (ref 4.22–5.81)
RDW: 18.4 % — ABNORMAL HIGH (ref 11.5–15.5)
WBC Count: 8.3 10*3/uL (ref 4.0–10.5)
nRBC: 0 % (ref 0.0–0.2)

## 2022-09-04 LAB — DAT, POLYSPECIFIC AHG (ARMC ONLY): Polyspecific AHG test: NEGATIVE

## 2022-09-04 LAB — VITAMIN B12: Vitamin B-12: 1270 pg/mL — ABNORMAL HIGH (ref 180–914)

## 2022-09-04 LAB — FERRITIN: Ferritin: 25 ng/mL (ref 24–336)

## 2022-09-04 LAB — LACTATE DEHYDROGENASE: LDH: 141 U/L (ref 98–192)

## 2022-09-04 NOTE — Progress Notes (Signed)
[facility]  [phone]) [phone] [phone]) [phone]  ID: [name] OB: 05-18-1941  MR#: [id number]  VQM#:[id number]  Patient Care Team: Marguarite Arbour, MD as PCP - General (Unknown Physician Specialty) Hillis Range, MD (Inactive) as PCP - Electrophysiology (Cardiology) Lennette Bihari, MD as PCP - Cardiology (Cardiology) Quintella Reichert, MD as PCP - Sleep Medicine (Cardiology) Duke, Roe Rutherford, PA as Physician Assistant (Cardiology)  CHIEF COMPLAINT: Iron deficiency anemia  INTERVAL HISTORY: Patient is an 82 year old male with multiple medical complaints who was referred to clinic for evaluation of a mild he has chronic weakness and fatigue.  He has no neurologic complaints.  He denies any recent fevers or illnesses.  He has a good appetite and denies weight loss.  He has no chest pain, shortness of breath, cough, or hemoptysis.  He denies any nausea, vomiting, constipation, or diarrhea.  He has no urinary complaints.  Patient offers no further specific complaints today.  REVIEW OF SYSTEMS:   Review of Systems  Constitutional:  Positive for malaise/fatigue. Negative for fever and weight loss.  Respiratory: Negative.  Negative for cough, hemoptysis and shortness of breath.   Cardiovascular: Negative.  Negative for chest pain and leg swelling.  Gastrointestinal:  Negative for abdominal pain.  Genitourinary: Negative.  Negative for dysuria.  Musculoskeletal: Negative.  Negative for back pain.  Skin: Negative.  Negative for rash.  Neurological:  Positive for focal weakness. Negative for dizziness, speech change and headaches.  Psychiatric/Behavioral: Negative.  The patient is not nervous/anxious.     As per HPI. Otherwise, a complete review of systems is negative.  PAST MEDICAL HISTORY: Past Medical History:  Diagnosis Date   Anemia    Anxiety self recent   Appendicitis    Atrial fibrillation 12/12/2013   Bone cancer father  35   BPH (benign  prostatic hypertrophy)    CAD (coronary artery disease) 12/2015   Cath by Dr Katrinka Blazing reveals distal and small vessel CAD.  Medical therapy advised.   Chest pain 12/03/2015   CHF (congestive heart failure)    Complete heart block    s/p PPM   Coronary artery disease    Coronary artery disease involving native coronary artery of native heart with unstable angina pectoris 08/11/2017   D-dimer, elevated 04/03/2017   Depression self recent   Drug reaction 07/11/2021   GERD (gastroesophageal reflux disease)    History of blood transfusion 1968   "probably; related to getting wounded in [place]"   History of kidney stones    History of SCC (squamous cell carcinoma) of skin 07/24/2020   right upper arm/excision   Hyperglycemia 11/05/2013   Hyperlipidemia 11/05/2013   Hypertension    Hypothyroidism    Hypothyroidism, unspecified 11/05/2013   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  AHI was 20/h while supine.  Oxygen saturations dropped to 87%.  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   Rheumatoid arthritis    "hands" (08/11/2017)   Type II diabetes mellitus     PAST SURGICAL HISTORY: Past Surgical History:  Procedure Laterality Date   BACK SURGERY     BALLOON DILATION N/A 05/24/2022   Procedure: BALLOON DILATION;  Surgeon: Imogene Burn, MD;  Location: [facility] ENDOSCOPY;  Service: Gastroenterology;  Laterality: N/A;   CARDIAC CATHETERIZATION N/A 12/28/2015   Procedure: Left Heart Cath and Coronary Angiography;  Surgeon: Lyn Records, MD;  Location[facility] INVASIVE CV LAB;  Service: Cardiovascular;  Laterality: N/A;   CATARACT EXTRACTION W/ INTRAOCULAR LENS  IMPLANT, BILATERAL Bilateral    COLONOSCOPY WITH PROPOFOL N/A 05/25/2022   Procedure: COLONOSCOPY WITH  PROPOFOL;  Surgeon: Imogene Burn, MD;  Location: [facility] ENDOSCOPY;  Service: Gastroenterology;  Laterality: N/A;   CORONARY ANGIOPLASTY WITH STENT PLACEMENT  08/11/2017   "2 stents"   CORONARY STENT INTERVENTION N/A 08/11/2017   Procedure: CORONARY STENT INTERVENTION;  Surgeon: Lennette Bihari, MD;  Location: [facility] INVASIVE CV LAB;  Service: Cardiovascular;  Laterality: N/A;   CYSTOSCOPY W/ STONE MANIPULATION     ESOPHAGOGASTRODUODENOSCOPY (EGD) WITH PROPOFOL N/A 05/24/2022   Procedure: ESOPHAGOGASTRODUODENOSCOPY (EGD) WITH PROPOFOL;  Surgeon: Imogene Burn, MD;  Location: [facility] ENDOSCOPY;  Service: Gastroenterology;  Laterality: N/A;   GIVENS CAPSULE STUDY N/A 05/25/2022   Procedure: GIVENS CAPSULE STUDY;  Surgeon: Imogene Burn, MD;  Location: [facility] ENDOSCOPY;  Service: Gastroenterology;  Laterality: N/A;   INGUINAL HERNIA REPAIR Left    INSERT / REPLACE / REMOVE PACEMAKER  12/09/2011   SJM [employer] DR RF implanted by DR Allred for complete heart block and syncope   JOINT REPLACEMENT     LAPAROSCOPIC CHOLECYSTECTOMY     LEAD REVISION/REPAIR N/A 08/09/2020   Procedure: LEAD REVISION/REPAIR;  Surgeon: Hillis Range, MD;  Location: [facility] INVASIVE CV LAB;  Service: Cardiovascular;  Laterality: N/A;   LITHOTRIPSY     LUMBAR DISC SURGERY     "removed arthritis and spurs"   PERMANENT PACEMAKER INSERTION N/A 12/09/2011   Procedure: PERMANENT PACEMAKER INSERTION;  Surgeon: Hillis Range, MD;  Location: [facility] CATH LAB;  Service: Cardiovascular;  Laterality: N/A;   PPM GENERATOR CHANGEOUT N/A 08/09/2020   Procedure: PPM GENERATOR CHANGEOUT;  Surgeon: Hillis Range, MD;  Location: [facility] INVASIVE CV LAB;  Service: Cardiovascular;  Laterality: N/A;   PROSTATE SURGERY     REPLACEMENT TOTAL KNEE Right    RIGHT HEART CATH N/A 05/29/2021   Procedure: RIGHT HEART CATH;  Surgeon: Lennette Bihari, MD;  Location: [facility] INVASIVE CV LAB;  Service: Cardiovascular;  Laterality: N/A;   RIGHT/LEFT HEART CATH AND CORONARY ANGIOGRAPHY N/A  08/11/2017   Procedure: RIGHT/LEFT HEART CATH AND CORONARY ANGIOGRAPHY;  Surgeon: Laurey Morale, MD;  Location: [facility] INVASIVE CV LAB;  Service: Cardiovascular;  Laterality: N/A;   TRANSURETHRAL RESECTION OF PROSTATE  2017/2018    FAMILY HISTORY: Family History  Problem Relation Age of Onset   Pancreatic cancer Mother    Cancer Mother    Bone cancer Father    Cancer Father    Alzheimer's disease Sister    Heart attack Paternal Uncle    Arthritis Maternal Grandfather    Alzheimer's disease Paternal Grandmother    Lung cancer Paternal Grandfather     ADVANCED DIRECTIVES (Y/N):  N  HEALTH MAINTENANCE: Social History   Tobacco Use   Smoking status: Former    Packs/day: 1.00    Years: 5.00    Additional pack years: 0.00    Total pack years: 5.00    Types: Cigarettes    Quit date: 07/10/1972    Years since quitting: 50.1   Smokeless tobacco: Former    Types: Chew    Quit date: 1975  Vaping Use   Vaping Use: Never used  Substance Use Topics   Alcohol use: Not Currently  Alcohol/week: 1.0 standard drink of alcohol    Types: 1 Glasses of wine per week   Drug use: No     Colonoscopy:  PAP:  Bone density:  Lipid panel:  Allergies  Allergen Reactions   Ace Inhibitors Swelling    Tongue swelling, angioedema   Lisinopril     Other reaction(s): Lip swelling, O/E - lip swelling   Celecoxib Rash    Skin rash     Current Outpatient Medications  Medication Sig Dispense Refill   acetaminophen (TYLENOL) 500 MG tablet Take 500 mg by mouth every 6 (six) hours as needed for moderate pain.     allopurinol (ZYLOPRIM) 300 MG tablet Take 300 mg by mouth daily.     apixaban (ELIQUIS) 2.5 MG TABS tablet Take by mouth 2 (two) times daily.     BERBERINE CHLORIDE PO Take 1,500 mg by mouth.     Coenzyme Q10 (CO Q 10 PO) Take by mouth.     JARDIANCE 10 MG TABS tablet Take 10 mg by mouth daily.     nebivolol (BYSTOLIC) 5 MG tablet Take 5 mg by mouth daily.     NON FORMULARY 500 mg.  Beef liver     Omega-3 Fatty Acids (OMEGA-3 FISH OIL PO) Take 1 capsule by mouth daily.     sertraline (ZOLOFT) 50 MG tablet Take 50 mg by mouth daily.     telmisartan (MICARDIS) 80 MG tablet Take 1 tablet (80 mg total) by mouth daily. 90 tablet 3   thyroid (ARMOUR) 90 MG tablet Take 90 mg by mouth every morning.      Trospium Chloride 60 MG CP24 TAKE 1 CAPSULE BY MOUTH DAILY (Patient taking differently: Take 1 capsule by mouth daily.) 30 capsule 2   TURMERIC PO Take 720 mg by mouth.     nitroGLYCERIN (NITROSTAT) 0.4 MG SL tablet Place 1 tablet (0.4 mg total) under the tongue every 5 (five) minutes as needed for chest pain. (Patient not taking: Reported on 09/04/2022) 75 tablet 2   pantoprazole (PROTONIX) 40 MG tablet Take 40 mg by mouth daily. (Patient not taking: Reported on 09/04/2022)     pravastatin (PRAVACHOL) 20 MG tablet Take 1 tablet (20 mg total) by mouth every evening. (Patient not taking: Reported on 09/04/2022) 90 tablet 3   VTAMA 1 % CREA APPLY 1 APPLICATION TOPICALLY DAILY (Patient not taking: Reported on 09/04/2022) 60 g 5   Current Facility-Administered Medications  Medication Dose Route Frequency Provider Last Rate Last Admin   sodium chloride flush (NS) 0.9 % injection 3 mL  3 mL Intravenous Q12H Lennette Bihari, MD       Facility-Administered Medications Ordered in Other Visits  Medication Dose Route Frequency Provider Last Rate Last Admin   ondansetron (ZOFRAN) 4 mg in sodium chloride 0.9 % 50 mL IVPB  4 mg Intravenous Q6H PRN Coletta Memos, MD        OBJECTIVE: Vitals:   09/04/22 1057  BP: (!) 143/62  Pulse: 70  Resp: 16  Temp: (!) 97 F (36.1 C)  SpO2: 98%     Body mass index is 26.63 kg/m.    ECOG FS:1 - Symptomatic but completely ambulatory  General: Well-developed, well-nourished, no acute distress. Eyes: Pink conjunctiva, anicteric sclera. HEENT: Normocephalic, moist mucous membranes. Lungs: No audible wheezing or coughing. Heart: Regular rate and  rhythm. Abdomen: Soft, nontender, no obvious distention. Musculoskeletal: No edema, cyanosis, or clubbing. Neuro: Alert, answering all questions appropriately. Cranial nerves grossly intact. Skin: No rashes  or petechiae noted. Psych: Normal affect. Lymphatics: No cervical, calvicular, axillary or inguinal LAD.   LAB RESULTS:  Lab Results  Component Value Date   NA 138 08/28/2022   K 3.9 08/28/2022   CL 106 08/28/2022   CO2 26 08/28/2022   GLUCOSE 160 (H) 08/28/2022   BUN 18 08/28/2022   CREATININE 0.96 08/28/2022   CALCIUM 9.2 08/28/2022   PROT 6.7 08/28/2022   ALBUMIN 3.8 08/28/2022   AST 25 08/28/2022   ALT 29 08/28/2022   ALKPHOS 193 (H) 08/28/2022   BILITOT 0.5 08/28/2022   GFRNONAA >60 05/30/2022   GFRAA >60 08/02/2019    Lab Results  Component Value Date   WBC 8.3 09/04/2022   NEUTROABS 6.2 08/28/2022   HGB 11.7 (L) 09/04/2022   HCT 36.9 (L) 09/04/2022   MCV 87.0 09/04/2022   PLT 252 09/04/2022   Lab Results  Component Value Date   IRON 34 (L) 09/04/2022   TIBC 442 09/04/2022   IRONPCTSAT 8 (L) 09/04/2022   Lab Results  Component Value Date   FERRITIN 25 09/04/2022     STUDIES: CUP PACEART REMOTE DEVICE CHECK  Result Date: 08/08/2022 Scheduled remote reviewed. Normal device function.  Ongoing AF from 08/02/2022, on OAC, AF burden is 2.5% of the time, will monitor for persistence.  Good ventricular rate control. Next remote 91 days. Hassell HalimMary Jo Lemke, RN, CCDS, [employer]   ASSESSMENT: Iron deficiency anemia.  PLAN:    Iron deficiency anemia: Patient noted to have a mildly decreased hemoglobin of 11.7 as well and has a decreased total iron and decreased iron saturation ratio.  He has no evidence of hemolysis.  Folic acid is within normal limits.  Although patient's anemia is mild, he may benefit from IV Venofer.  Return to clinic in 3 weeks for further evaluation, discussion of his laboratory results, and initiation of treatment. Chronic  fatigue: Laboratory work as above.  I have ordered a thyroid panel for completeness. Poor appetite: Recommended patient discuss possible dietary referral with his primary care physician.  I spent a total of 45 minutes reviewing chart data, face-to-face evaluation with the patient, counseling and coordination of care as detailed above.   Patient expressed understanding and was in agreement with this plan. He also understands that He can call clinic at any time with any questions, concerns, or complaints.    Jeralyn Ruthsimothy J Kemari Mares, MD   09/04/2022 2:02 PM

## 2022-09-05 ENCOUNTER — Encounter: Payer: Self-pay | Admitting: Cardiovascular Disease

## 2022-09-05 ENCOUNTER — Encounter: Payer: Self-pay | Admitting: Internal Medicine

## 2022-09-05 LAB — THYROID PANEL WITH TSH
Free Thyroxine Index: 1.4 (ref 1.2–4.9)
T3 Uptake Ratio: 30 % (ref 24–39)
T4, Total: 4.6 ug/dL (ref 4.5–12.0)
TSH: 3.7 u[IU]/mL (ref 0.450–4.500)

## 2022-09-05 LAB — HAPTOGLOBIN: Haptoglobin: 154 mg/dL (ref 38–329)

## 2022-09-08 NOTE — Telephone Encounter (Signed)
  Labs reviewed and I am glad he is feeling better.  Is he still off the pirfenidone?  And this is why he is feeling better because he is off pirfenidone?  Let me know     Latest Reference Range & Units 08/28/22 16:31 09/04/22 11:47  BASIC METABOLIC PANEL  Rpt !   Sodium 135 - 145 mEq/L 138   Potassium 3.5 - 5.1 mEq/L 3.9   Chloride 96 - 112 mEq/L 106   CO2 19 - 32 mEq/L 26   Glucose 70 - 99 mg/dL 119 (H)   BUN 6 - 23 mg/dL 18   Creatinine 1.47 - 1.50 mg/dL 8.29   Calcium 8.4 - 56.2 mg/dL 9.2   Alkaline Phosphatase 39 - 117 U/L 193 (H)   Albumin 3.5 - 5.2 g/dL 3.8   AST 0 - 37 U/L 25   ALT 0 - 53 U/L 29   Total Protein 6.0 - 8.3 g/dL 6.7   Bilirubin, Direct 0.0 - 0.3 mg/dL 0.2   Total Bilirubin 0.2 - 1.2 mg/dL 0.5   GFR >13.08 mL/min 73.66   LDH 98 - 192 U/L  141  Iron 45 - 182 ug/dL  34 (L)  UIBC ug/dL  657  TIBC 846 - 962 ug/dL  952  Saturation Ratios 17.9 - 39.5 %  8 (L)  Ferritin 24 - 336 ng/mL  25  Folate >5.9 ng/mL  21.2  VITD 30.00 - 100.00 ng/mL 32.05   Vitamin B12 180 - 914 pg/mL  1,270 (H)  WBC 4.0 - 10.5 K/uL 9.3 8.3  RBC 4.22 - 5.81 MIL/uL 4.20 (L) 4.24  Hemoglobin 13.0 - 17.0 g/dL 84.1 (L) 32.4 (L)  HCT 39.0 - 52.0 % 36.0 (L) 36.9 (L)  MCV 80.0 - 100.0 fL 85.9 87.0  MCH 26.0 - 34.0 pg  27.6  MCHC 30.0 - 36.0 g/dL 40.1 02.7  RDW 25.3 - 66.4 % 18.4 (H) 18.4 (H)  Platelets 150 - 400 K/uL 202.0 252  nRBC 0.0 - 0.2 %  0.0  Neutrophils 43.0 - 77.0 % 67.3   Lymphocytes 12.0 - 46.0 % 18.5   Monocytes Relative 3.0 - 12.0 % 9.5   Eosinophil 0.0 - 5.0 % 3.3   Basophil 0.0 - 3.0 % 1.4   NEUT# 1.4 - 7.7 K/uL 6.2   Lymphocyte # 0.7 - 4.0 K/uL 1.7   Monocyte # 0.1 - 1.0 K/uL 0.9   Eosinophils Absolute 0.0 - 0.7 K/uL 0.3   Basophils Absolute 0.0 - 0.1 K/uL 0.1   TSH 0.450 - 4.500 uIU/mL  3.700  T4,Free(Direct) 0.60 - 1.60 ng/dL 4.03   Thyroxine (T4) 4.5 - 12.0 ug/dL  4.6  Free Thyroxine Index 1.2 - 4.9   1.4  T3 Uptake Ratio 24 - 39 %  30  Polyspecific AHG test    NEG Performed at [facility], [address] [place], [place] [address]   !: Data is abnormal (H): Data is abnormally high (L): Data is abnormally low Rpt: View report in Results Review for more information

## 2022-09-09 ENCOUNTER — Telehealth: Payer: Self-pay | Admitting: Internal Medicine

## 2022-09-09 ENCOUNTER — Other Ambulatory Visit (INDEPENDENT_AMBULATORY_CARE_PROVIDER_SITE_OTHER): Payer: Medicare Other

## 2022-09-09 ENCOUNTER — Other Ambulatory Visit: Payer: Self-pay

## 2022-09-09 DIAGNOSIS — R0602 Shortness of breath: Secondary | ICD-10-CM | POA: Diagnosis not present

## 2022-09-09 DIAGNOSIS — J849 Interstitial pulmonary disease, unspecified: Secondary | ICD-10-CM

## 2022-09-09 LAB — TROPONIN I (HIGH SENSITIVITY): High Sens Troponin I: 13 ng/L (ref 2–17)

## 2022-09-09 LAB — BRAIN NATRIURETIC PEPTIDE: Pro B Natriuretic peptide (BNP): 482 pg/mL — ABNORMAL HIGH (ref 0.0–100.0)

## 2022-09-09 NOTE — Telephone Encounter (Signed)
Telephone call to patient.  Spoke with patient and his wife, Cameron [name].  Patient is NOT taking pirfenidone.  He does not know if d/c this med has helped him feel better. Patient has an echocardiogram at [facility] on 09/17/22 at 12:30 pm.  Patient stated he will come to our office today to get lab work done.  Will order troponin and BNP now.  Patient states all questions answered up to this point and will wait for call regarding most recent lab results.  Nothing further needed at this time.

## 2022-09-09 NOTE — Telephone Encounter (Signed)
   There unexpected findings on the CT scan of pulmonary congestion from cardiac issue.  I was not suspecting this clinically when I saw him.  Plan - Come to the office and get blood work for troponin and also BNP -> if the these are abnormal we will have to alert his cardiologist Dr. Nicki Guadalajara  -Get echocardiogram done as soon as possible within the next few days to few weeks  Narrative & Impression  CLINICAL DATA:  Follow-up interstitial lung disease.  Former smoker.   EXAM: CT CHEST WITHOUT CONTRAST   TECHNIQUE: Multidetector CT imaging of the chest was performed following the standard protocol without intravenous contrast. High resolution imaging of the lungs, as well as inspiratory and expiratory imaging, was performed.   RADIATION DOSE REDUCTION: This exam was performed according to the departmental dose-optimization program which includes automated exposure control, adjustment of the mA and/or kV according to patient size and/or use of iterative reconstruction technique.   COMPARISON:  09/24/2021 high-resolution chest CT.   FINDINGS: Cardiovascular: New mild cardiomegaly with stable 3 lead left subclavian pacemaker with lead tips in the right atrium and right ventricular apex. No significant pericardial effusion/thickening. Three-vessel coronary atherosclerosis. Atherosclerotic nonaneurysmal thoracic aorta. Dilated main pulmonary artery (4.1 cm diameter).   Mediastinum/Nodes: No significant thyroid nodules. Unremarkable esophagus. No axillary adenopathy. New mild right paratracheal adenopathy up to 1.1 cm (series 2/image 51). No discrete hilar adenopathy on these noncontrast images.   Lungs/Pleura: No pneumothorax. Small dependent right pleural effusion. No significant left pleural effusion. No acute consolidative airspace disease, lung masses or significant pulmonary nodules. Mild patchy air trapping in both lungs on the expiration sequence, similar. No evidence  of tracheobronchomalacia. Mild-to-moderate interlobular septal thickening, subpleural reticulation and ground-glass opacity and mild traction bronchiectasis and architectural distortion throughout both lungs with no frank honeycombing. Slight basilar predominance to these findings. The septal thickening has worsened in the interval.   Upper abdomen: Cholecystectomy. Nonobstructing 9 mm upper left renal stone.   Musculoskeletal: No aggressive appearing focal osseous lesions. Moderate thoracic spondylosis.   IMPRESSION: 1. Spectrum of findings suggestive of new mild congestive heart failure superimposed on chronic fibrotic interstitial lung disease. New mild cardiomegaly. Small dependent right pleural effusion. Worsened generalized pulmonary septal thickening compatible with cardiogenic pulmonary edema. 2. Assessment of progression of the underlying chronic interstitial lung disease is confounded by the above findings. Given mild air trapping, fibrotic NSIP remains the favored diagnosis, with UIP not excluded. Findings are indeterminate for UIP per consensus guidelines: Diagnosis of Idiopathic Pulmonary Fibrosis: An Official ATS/ERS/JRS/ALAT Clinical Practice Guideline. Am Rosezetta Schlatter Crit Care Med Vol 198, Iss 5, [phone], Jan 24 2017. 3. Dilated main pulmonary artery, suggesting pulmonary arterial hypertension. 4. Three-vessel coronary atherosclerosis. 5. Nonobstructing left nephrolithiasis. 6.  Aortic Atherosclerosis (ICD10-I70.0).     Electronically Signed   By: Delbert Phenix M.D.   On: 09/08/2022 08:24

## 2022-09-09 NOTE — Telephone Encounter (Signed)
His BNP is high.  His troponin is normal.  I think there is some condition going on because of cardiac issue.  I would not be surprised if his ejection fraction has dropped.  He is seeing Dr. Carolanne Grumbling next week.  He should definitely keep that appointment.  If he is feeling more winded he can call us and try to get some Lasix

## 2022-09-09 NOTE — Progress Notes (Signed)
Remote pacemaker transmission.   

## 2022-09-10 NOTE — Telephone Encounter (Signed)
Called and spoke to patients wife and went over lab work results with her. She verbalized understanding. Nothing further needed

## 2022-09-15 ENCOUNTER — Telehealth: Payer: Self-pay

## 2022-09-15 ENCOUNTER — Telehealth: Payer: Self-pay | Admitting: *Deleted

## 2022-09-15 ENCOUNTER — Ambulatory Visit: Payer: Medicare Other | Admitting: Cardiology

## 2022-09-15 DIAGNOSIS — I10 Essential (primary) hypertension: Secondary | ICD-10-CM

## 2022-09-15 DIAGNOSIS — G4733 Obstructive sleep apnea (adult) (pediatric): Secondary | ICD-10-CM

## 2022-09-15 DIAGNOSIS — I2510 Atherosclerotic heart disease of native coronary artery without angina pectoris: Secondary | ICD-10-CM

## 2022-09-15 NOTE — Telephone Encounter (Signed)
Order placed to [facility] for new cpap supplies via community message.   DME selection is [facility] Patient understands he will be contacted by [facility] to set up his cpap. Patient understands to call if [facility] does not contact him with new setup in a timely manner. Patient understands they will be called once confirmation has been received from [place] that they have received their new machine to schedule 10 week follow up appointment.   [facility] notified of new cpap order  Please add to airview Patient was grateful for the call and thanked me

## 2022-09-15 NOTE — Telephone Encounter (Signed)
Patient scheduled with Dr. Mayford Knife for a new patient visit for sleep apnea, but appears to be established with [name] for OSA. Alerted patient he needs to be seen by [name], sent staff message to Dr. Landry Dyke office.

## 2022-09-17 ENCOUNTER — Ambulatory Visit (HOSPITAL_COMMUNITY)
Admission: RE | Admit: 2022-09-17 | Discharge: 2022-09-17 | Disposition: A | Discharge status: Home / Self Care | Payer: Medicare Other | Source: Ambulatory Visit | Attending: Internal Medicine | Admitting: Internal Medicine

## 2022-09-17 DIAGNOSIS — I4891 Unspecified atrial fibrillation: Secondary | ICD-10-CM | POA: Insufficient documentation

## 2022-09-17 DIAGNOSIS — Z950 Presence of cardiac pacemaker: Secondary | ICD-10-CM | POA: Diagnosis not present

## 2022-09-17 DIAGNOSIS — I509 Heart failure, unspecified: Secondary | ICD-10-CM | POA: Diagnosis not present

## 2022-09-17 DIAGNOSIS — R0609 Other forms of dyspnea: Secondary | ICD-10-CM | POA: Insufficient documentation

## 2022-09-17 DIAGNOSIS — E119 Type 2 diabetes mellitus without complications: Secondary | ICD-10-CM | POA: Diagnosis not present

## 2022-09-17 DIAGNOSIS — K219 Gastro-esophageal reflux disease without esophagitis: Secondary | ICD-10-CM | POA: Diagnosis not present

## 2022-09-17 DIAGNOSIS — I083 Combined rheumatic disorders of mitral, aortic and tricuspid valves: Secondary | ICD-10-CM | POA: Diagnosis not present

## 2022-09-17 DIAGNOSIS — I110 Hypertensive heart disease with heart failure: Secondary | ICD-10-CM | POA: Diagnosis not present

## 2022-09-17 DIAGNOSIS — J849 Interstitial pulmonary disease, unspecified: Secondary | ICD-10-CM | POA: Diagnosis not present

## 2022-09-17 DIAGNOSIS — E785 Hyperlipidemia, unspecified: Secondary | ICD-10-CM | POA: Diagnosis not present

## 2022-09-17 DIAGNOSIS — I2510 Atherosclerotic heart disease of native coronary artery without angina pectoris: Secondary | ICD-10-CM | POA: Insufficient documentation

## 2022-09-17 DIAGNOSIS — G4730 Sleep apnea, unspecified: Secondary | ICD-10-CM | POA: Insufficient documentation

## 2022-09-17 LAB — ECHOCARDIOGRAM COMPLETE
Area-P 1/2: 5.27 cm2
Calc EF: 43.7 %
MV M vel: 4.62 m/s
MV Peak grad: 85.4 mmHg
MV VTI: 1.65 cm2
P 1/2 time: 592 msec
Radius: 0.4 cm
S' Lateral: 3.8 cm
Single Plane A2C EF: 44.7 %
Single Plane A4C EF: 44.1 %

## 2022-09-17 NOTE — Progress Notes (Signed)
  Echocardiogram 2D Echocardiogram has been performed.  Augustine Radar 09/17/2022, 1:47 PM

## 2022-09-18 ENCOUNTER — Telehealth: Payer: Self-pay | Admitting: Internal Medicine

## 2022-09-18 ENCOUNTER — Encounter: Payer: Self-pay | Admitting: Internal Medicine

## 2022-09-18 NOTE — Telephone Encounter (Signed)
    It is incorrect that the results were not given out on the CT scan.  Please refer to my phone note on 09/09/2022 when I was concerned about heart failure and told him to talk to cardiology.  He had an echocardiogram yesterday that shows low ejection fraction along with grade 2 diastolic dysfunction.  This is a change compared to November 2022.   They need to call his cardiologist [name] and inform that there is a drop in the ejection fraction and get to be seen

## 2022-09-18 NOTE — Telephone Encounter (Signed)
Hi Cameron [name]  He has ild. Saw in clinic for fatigue.ct showed effusion. Echo with NEW ef drop and elevation in pasp. He definitely needs RHC  Thanks  MR  LABS   =   IMAGING x48h  - image(s) personally visualized  -   highlighted in bold ECHOCARDIOGRAM COMPLETE  Result Date: 09/17/2022    ECHOCARDIOGRAM REPORT   Patient Name:   Cameron [name] Date of Exam: 09/17/2022 Medical Rec #:  [id number]       Height:       67.0 in Accession #:    [id number]      Weight:       170.0 lb Date of Birth:  1940-06-30       BSA:          1.887 m Patient Age:    81 years        BP:           143/62 mmHg Patient Gender: M               HR:           77 bpm. Exam Location:  Outpatient Procedure: 2D Echo, Cardiac Doppler, Color Doppler and 3D Echo Indications:    Dyspnea R06.00  History:        Patient has prior history of Echocardiogram examinations, most                 recent 04/08/2021. CHF, CAD, Pacemaker, Arrythmias:Atrial                 Fibrillation, Signs/Symptoms:Chest Pain and Dyspnea; Risk                 Factors:Sleep Apnea, Diabetes, Hypertension, Dyslipidemia and                 GERD.  Sonographer:    Eulah Pont RDCS Referring Phys: 36 Riker Collier IMPRESSIONS  1. Left ventricular ejection fraction, by estimation, is 45 to 50%. Left ventricular ejection fraction by 2D MOD biplane is 43.7 %. The left ventricle has mildly decreased function. The left ventricle demonstrates global hypokinesis. Left ventricular diastolic parameters are consistent with Grade II diastolic dysfunction (pseudonormalization). Elevated left ventricular end-diastolic pressure.  2. Right ventricular systolic function is moderately reduced. The right ventricular size is normal. There is severely elevated pulmonary artery systolic pressure.  3. Left atrial size was severely dilated.  4. Right atrial size was mildly dilated.  5. The mitral valve is normal in structure. Mild mitral valve regurgitation. No evidence of mitral  stenosis.  6. Tricuspid valve regurgitation is moderate.  7. The aortic valve is tricuspid. Aortic valve regurgitation is mild. No aortic stenosis is present. Aortic regurgitation PHT measures 592 msec.  8. The inferior vena cava is normal in size with greater than 50% respiratory variability, suggesting right atrial pressure of 3 mmHg. FINDINGS  Left Ventricle: Left ventricular ejection fraction, by estimation, is 45 to 50%. Left ventricular ejection fraction by 2D MOD biplane is 43.7 %. The left ventricle has mildly decreased function. The left ventricle demonstrates global hypokinesis. The left ventricular internal cavity size was normal in size. There is no left ventricular hypertrophy. Left ventricular diastolic parameters are consistent with Grade II diastolic dysfunction (pseudonormalization). Elevated left ventricular end-diastolic pressure. Right Ventricle: The right ventricular size is normal. No increase in right ventricular wall thickness. Right ventricular systolic function is moderately reduced. There is severely elevated pulmonary artery systolic pressure. The tricuspid regurgitant velocity  is 3.39 m/s, and with an assumed right atrial pressure of 15 mmHg, the estimated right ventricular systolic pressure is 61.0 mmHg. Left Atrium: Left atrial size was severely dilated. Right Atrium: Right atrial size was mildly dilated. Pericardium: There is no evidence of pericardial effusion. Mitral Valve: The mitral valve is normal in structure. Mild mitral annular calcification. Mild mitral valve regurgitation. No evidence of mitral valve stenosis. MV peak gradient, 6.7 mmHg. The mean mitral valve gradient is 2.0 mmHg. Tricuspid Valve: The tricuspid valve is normal in structure. Tricuspid valve regurgitation is moderate . No evidence of tricuspid stenosis. Aortic Valve: The aortic valve is tricuspid. Aortic valve regurgitation is mild. Aortic regurgitation PHT measures 592 msec. No aortic stenosis is present.  Pulmonic Valve: The pulmonic valve was normal in structure. Pulmonic valve regurgitation is mild. No evidence of pulmonic stenosis. Aorta: The aortic root is normal in size and structure. Venous: The inferior vena cava is normal in size with greater than 50% respiratory variability, suggesting right atrial pressure of 3 mmHg. IAS/Shunts: No atrial level shunt detected by color flow Doppler. Additional Comments: A device lead is visualized.  LEFT VENTRICLE PLAX 2D                        Biplane EF (MOD) LVIDd:         5.10 cm         LV Biplane EF:   Left LVIDs:         3.80 cm                          ventricular LV PW:         0.90 cm                          ejection LV IVS:        0.90 cm                          fraction by LVOT diam:     2.00 cm                          2D MOD LV SV:         55                               biplane is LV SV Index:   29                               43.7 %. LVOT Area:     3.14 cm                                Diastology                                LV e' medial:    5.57 cm/s LV Volumes (MOD)               LV E/e' medial:  20.5 LV vol d, MOD    81.2 ml       LV e' lateral:  10.60 cm/s A2C:                           LV E/e' lateral: 10.8 LV vol d, MOD    95.7 ml A4C: LV vol s, MOD    44.9 ml A2C: LV vol s, MOD    53.5 ml       3D Volume EF: A4C:                           3D EF:        40 % LV SV MOD A2C:   36.3 ml       LV EDV:       157 ml LV SV MOD A4C:   95.7 ml       LV ESV:       95 ml LV SV MOD BP:    39.3 ml       LV SV:        63 ml RIGHT VENTRICLE RV S prime:     10.80 cm/s TAPSE (M-mode): 1.6 cm LEFT ATRIUM             Index        RIGHT ATRIUM           Index LA diam:        4.90 cm 2.60 cm/m   RA Area:     19.00 cm LA Vol (A2C):   61.0 ml 32.32 ml/m  RA Volume:   55.30 ml  29.30 ml/m LA Vol (A4C):   86.2 ml 45.67 ml/m LA Biplane Vol: 78.0 ml 41.33 ml/m  AORTIC VALVE             PULMONIC VALVE LVOT Vmax:   92.00 cm/s  PR End Diast Vel: 7.84 msec LVOT Vmean:   57.200 cm/s LVOT VTI:    0.176 m AI PHT:      592 msec  AORTA Ao Root diam: 2.90 cm Ao Asc diam:  3.60 cm MITRAL VALVE                  TRICUSPID VALVE MV Area (PHT): 5.27 cm       TR Peak grad:   46.0 mmHg MV Area VTI:   1.65 cm       TR Vmax:        339.00 cm/s MV Peak grad:  6.7 mmHg MV Mean grad:  2.0 mmHg       SHUNTS MV Vmax:       1.29 m/s       Systemic VTI:  0.18 m MV Vmean:      71.8 cm/s      Systemic Diam: 2.00 cm MV Decel Time: 144 msec MR Peak grad:    85.4 mmHg MR Mean grad:    52.0 mmHg MR Vmax:         462.00 cm/s MR Vmean:        344.0 cm/s MR PISA:         1.01 cm MR PISA Eff ROA: 9 mm MR PISA Radius:  0.40 cm MV E velocity: 114.00 cm/s MV A velocity: 50.70 cm/s MV E/A ratio:  2.25 Chilton Si MD Electronically signed by Chilton Si MD Signature Date/Time: 09/17/2022/6:01:20 PM    Final

## 2022-09-18 NOTE — Telephone Encounter (Signed)
Pt. Wife Calling to go over the CT its been release in my chart abut no notes have been added by provider could we please advise wife

## 2022-09-19 ENCOUNTER — Telehealth: Payer: Self-pay | Admitting: Cardiovascular Disease

## 2022-09-19 ENCOUNTER — Ambulatory Visit: Payer: Medicare Other | Admitting: General Practice

## 2022-09-19 NOTE — Telephone Encounter (Signed)
Wife called back and I updated her and him - ef 45%-40% Keep appt with Dr Tresa Endo If worse go to ER

## 2022-09-19 NOTE — Telephone Encounter (Signed)
Patient scheduled for May 1 with Dr Tresa Endo

## 2022-09-19 NOTE — Telephone Encounter (Signed)
Called pt. To only let her know she needs to talk to [name] cardio Dr. And she stated she had apt today

## 2022-09-19 NOTE — Telephone Encounter (Signed)
Cardio clinic Dr. canceled the apt. Today and want Dr. Marchelle Gearing to be aware of what was going on he has a apt to go back May 1st 4:30 please advise if they need to here from [place] in the mean time about anything

## 2022-09-19 NOTE — Telephone Encounter (Signed)
Called wife Teyon Odette -> to give update. Left message saying needs to keep appt with DR Tresa Endo on 09/24/22

## 2022-09-19 NOTE — Telephone Encounter (Signed)
Pts wife, Larene Beach states that the pts PCP - Dr. Judithann Sheen and Dr. Marchelle Gearing have advised the pt to be seen by [name], since his echo. She states she was told Dr. Judithann Sheen would get in touch with [name] regarding the echo but just wanted to f/u to see if it had been done. She states the pt is losing weight. He is not eating well - food don't taste good, as well as pt has to make himself eat. Pt has weakness, pt is just not what he used to be. Wife is frustrated because they have been getting the tests and then no one is telling them anything, she is worried he might have another bleed somewhere.

## 2022-09-24 ENCOUNTER — Ambulatory Visit (INDEPENDENT_AMBULATORY_CARE_PROVIDER_SITE_OTHER): Payer: Medicare Other | Admitting: Cardiovascular Disease

## 2022-09-24 ENCOUNTER — Other Ambulatory Visit: Payer: Self-pay | Admitting: Medical Oncology

## 2022-09-24 ENCOUNTER — Inpatient Hospital Stay: Payer: Medicare Other

## 2022-09-24 ENCOUNTER — Encounter: Payer: Self-pay | Admitting: Oncology

## 2022-09-24 ENCOUNTER — Inpatient Hospital Stay (HOSPITAL_BASED_OUTPATIENT_CLINIC_OR_DEPARTMENT_OTHER): Payer: Medicare Other | Admitting: Medical Oncology

## 2022-09-24 ENCOUNTER — Inpatient Hospital Stay: Payer: Medicare Other | Attending: Oncology | Admitting: Oncology

## 2022-09-24 VITALS — BP 129/60 | HR 70 | Temp 96.6°F

## 2022-09-24 VITALS — BP 134/62 | HR 75 | Resp 16

## 2022-09-24 VITALS — BP 129/60 | HR 70 | Temp 96.6°F | Resp 16 | Ht 67.0 in | Wt 158.0 lb

## 2022-09-24 DIAGNOSIS — R5383 Other fatigue: Secondary | ICD-10-CM | POA: Insufficient documentation

## 2022-09-24 DIAGNOSIS — D509 Iron deficiency anemia, unspecified: Secondary | ICD-10-CM | POA: Diagnosis not present

## 2022-09-24 DIAGNOSIS — D500 Iron deficiency anemia secondary to blood loss (chronic): Secondary | ICD-10-CM

## 2022-09-24 DIAGNOSIS — G4733 Obstructive sleep apnea (adult) (pediatric): Secondary | ICD-10-CM

## 2022-09-24 DIAGNOSIS — I809 Phlebitis and thrombophlebitis of unspecified site: Secondary | ICD-10-CM | POA: Diagnosis not present

## 2022-09-24 DIAGNOSIS — I2510 Atherosclerotic heart disease of native coronary artery without angina pectoris: Secondary | ICD-10-CM | POA: Insufficient documentation

## 2022-09-24 DIAGNOSIS — D6869 Other thrombophilia: Secondary | ICD-10-CM | POA: Insufficient documentation

## 2022-09-24 DIAGNOSIS — I4892 Unspecified atrial flutter: Secondary | ICD-10-CM | POA: Insufficient documentation

## 2022-09-24 DIAGNOSIS — Z950 Presence of cardiac pacemaker: Secondary | ICD-10-CM | POA: Insufficient documentation

## 2022-09-24 DIAGNOSIS — R0609 Other forms of dyspnea: Secondary | ICD-10-CM | POA: Insufficient documentation

## 2022-09-24 DIAGNOSIS — R630 Anorexia: Secondary | ICD-10-CM | POA: Insufficient documentation

## 2022-09-24 DIAGNOSIS — I10 Essential (primary) hypertension: Secondary | ICD-10-CM | POA: Insufficient documentation

## 2022-09-24 DIAGNOSIS — J849 Interstitial pulmonary disease, unspecified: Secondary | ICD-10-CM | POA: Insufficient documentation

## 2022-09-24 MED ORDER — TRIAMCINOLONE ACETONIDE 0.5 % EX OINT: 1.0000 | TOPICAL_OINTMENT | Freq: Two times a day (BID) | CUTANEOUS | 0 refills | Status: AC

## 2022-09-24 MED ORDER — SODIUM CHLORIDE 0.9 % IV SOLN
Freq: Once | INTRAVENOUS | Status: AC
  Filled 2022-09-24: qty 250

## 2022-09-24 MED ORDER — SPIRONOLACTONE 25 MG PO TABS: ORAL_TABLET | ORAL | 3 refills | Status: DC

## 2022-09-24 MED ORDER — SODIUM CHLORIDE 0.9 % IV SOLN
200.0000 mg | Freq: Once | INTRAVENOUS | Status: AC
  Administered 2022-09-24: 200 mg via INTRAVENOUS
  Filled 2022-09-24: qty 200

## 2022-09-24 NOTE — Progress Notes (Signed)
1050: Wife notice red streak from IV (RAC) insertion going up patient's right arm. Patient stated there was no pain or itching to the site. No other symptoms present at this time. PA, [place] notified via secure chat of findings.  1055: NP at chairside. NP stated to increase the flush rate and continue to monitor. NP also, called in a cream to apply to site if needed. RN increased to from 10ml.  1115: Red streak is gone and VSS. RN stated to the patient and his wife that it will be noted in his chart of findings and for the next infusion will be done in his left arm. Patient has no questions or concerns.
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
## 2022-09-24 NOTE — Patient Instructions (Signed)

## 2022-09-24 NOTE — Progress Notes (Signed)
Symptom Management Clinic [facility] at [facility] [phone] [phone]  Patient Care Team: Marguarite Arbour, MD as PCP - General (Unknown Physician Specialty) Hillis Range, MD (Inactive) as PCP - Electrophysiology (Cardiology) Lennette Bihari, MD as PCP - Cardiology (Cardiology) Quintella Reichert, MD as PCP - Sleep Medicine (Cardiology) Duke, Roe Rutherford, PA as Physician Assistant (Cardiology)   Name of the patient: Cameron [name]  [id number]  01-15-1941   Hematological History: Iron Deficiency Anemia   Current Treatment: IV Venofer   Date of visit: 09/24/22  Reason for Consult: Cameron [name] is a 82 y.o. male who presents today for:  Arm Redness: Patient was here receiving IV Venofer. After infusion redness of the arm was noted. The patient denied pain, fever, swelling, neuro changes. Nothing has been applied. He has tolerated IV Venofer well in the past without similar occurrence.   Denies any neurologic complaints. Denies recent fevers or illnesses. Denies any easy bleeding or bruising. Reports good appetite and denies weight loss. Denies chest pain. Denies any nausea, vomiting, constipation, or diarrhea. Denies urinary complaints. Patient offers no further specific complaints today.    PAST MEDICAL HISTORY: Past Medical History:  Diagnosis Date   Anemia    Anxiety self recent   Appendicitis    Atrial fibrillation (HCC) 12/12/2013   Bone cancer [facility]) father  59   BPH (benign prostatic hypertrophy)    CAD (coronary artery disease) 12/2015   Cath by Dr Katrinka Blazing reveals distal and small vessel CAD.  Medical therapy advised.   Chest pain 12/03/2015   CHF (congestive heart failure) (HCC)    Complete heart block (HCC)    s/p PPM   Coronary artery disease    Coronary artery disease involving native coronary artery of native heart with unstable angina pectoris (HCC) 08/11/2017   D-dimer, elevated 04/03/2017   Depression self  recent   Drug reaction 07/11/2021   GERD (gastroesophageal reflux disease)    History of blood transfusion 1968   "probably; related to getting wounded in [place]"   History of kidney stones    History of SCC (squamous cell carcinoma) of skin 07/24/2020   right upper arm/excision   Hyperglycemia 11/05/2013   Hyperlipidemia 11/05/2013   Hypertension    Hypothyroidism    Hypothyroidism, unspecified 11/05/2013   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  AHI was 20/h while supine.  Oxygen saturations dropped to 87%.  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   Rheumatoid arthritis (HCC)    "hands" (08/11/2017)   Type II diabetes mellitus (HCC)     PAST SURGICAL HISTORY:  Past Surgical History:  Procedure Laterality Date   BACK SURGERY     BALLOON DILATION N/A 05/24/2022   Procedure: BALLOON DILATION;  Surgeon: Imogene Burn, MD;  Location: [facility] ENDOSCOPY;  Service: Gastroenterology;  Laterality: N/A;   CARDIAC CATHETERIZATION N/A 12/28/2015   Procedure: Left Heart Cath and Coronary Angiography;  Surgeon: Lyn Records, MD;  Location: [facility] INVASIVE CV LAB;  Service: Cardiovascular;  Laterality: N/A;   CATARACT EXTRACTION W/ INTRAOCULAR LENS  IMPLANT, BILATERAL Bilateral    COLONOSCOPY WITH PROPOFOL N/A 05/25/2022   Procedure: COLONOSCOPY WITH PROPOFOL;  Surgeon: Norwood Levo  C, MD;  Location: [facility] ENDOSCOPY;  Service: Gastroenterology;  Laterality: N/A;   CORONARY ANGIOPLASTY WITH STENT PLACEMENT  08/11/2017   "2 stents"   CORONARY STENT INTERVENTION N/A 08/11/2017   Procedure: CORONARY STENT INTERVENTION;  Surgeon: Lennette Bihari, MD;  Location: [facility] INVASIVE CV LAB;  Service: Cardiovascular;  Laterality: N/A;   CYSTOSCOPY W/ STONE MANIPULATION      ESOPHAGOGASTRODUODENOSCOPY (EGD) WITH PROPOFOL N/A 05/24/2022   Procedure: ESOPHAGOGASTRODUODENOSCOPY (EGD) WITH PROPOFOL;  Surgeon: Imogene Burn, MD;  Location: [facility] ENDOSCOPY;  Service: Gastroenterology;  Laterality: N/A;   GIVENS CAPSULE STUDY N/A 05/25/2022   Procedure: GIVENS CAPSULE STUDY;  Surgeon: Imogene Burn, MD;  Location: [facility] ENDOSCOPY;  Service: Gastroenterology;  Laterality: N/A;   INGUINAL HERNIA REPAIR Left    INSERT / REPLACE / REMOVE PACEMAKER  12/09/2011   SJM Accent DR RF implanted by DR Allred for complete heart block and syncope   JOINT REPLACEMENT     LAPAROSCOPIC CHOLECYSTECTOMY     LEAD REVISION/REPAIR N/A 08/09/2020   Procedure: LEAD REVISION/REPAIR;  Surgeon: Hillis Range, MD;  Location: [facility] INVASIVE CV LAB;  Service: Cardiovascular;  Laterality: N/A;   LITHOTRIPSY     LUMBAR DISC SURGERY     "removed arthritis and spurs"   PERMANENT PACEMAKER INSERTION N/A 12/09/2011   Procedure: PERMANENT PACEMAKER INSERTION;  Surgeon: Hillis Range, MD;  Location: [facility] CATH LAB;  Service: Cardiovascular;  Laterality: N/A;   PPM GENERATOR CHANGEOUT N/A 08/09/2020   Procedure: PPM GENERATOR CHANGEOUT;  Surgeon: Hillis Range, MD;  Location: [facility] INVASIVE CV LAB;  Service: Cardiovascular;  Laterality: N/A;   PROSTATE SURGERY     REPLACEMENT TOTAL KNEE Right    RIGHT HEART CATH N/A 05/29/2021   Procedure: RIGHT HEART CATH;  Surgeon: Lennette Bihari, MD;  Location: [facility] INVASIVE CV LAB;  Service: Cardiovascular;  Laterality: N/A;   RIGHT/LEFT HEART CATH AND CORONARY ANGIOGRAPHY N/A 08/11/2017   Procedure: RIGHT/LEFT HEART CATH AND CORONARY ANGIOGRAPHY;  Surgeon: Laurey Morale, MD;  Location: [facility] INVASIVE CV LAB;  Service: Cardiovascular;  Laterality: N/A;   TRANSURETHRAL RESECTION OF PROSTATE  2017/2018    HEMATOLOGY/ONCOLOGY HISTORY:  Oncology History   No history exists.    ALLERGIES:  is allergic to ace inhibitors, lisinopril, and celecoxib.  MEDICATIONS:  Current Outpatient  Medications  Medication Sig Dispense Refill   acetaminophen (TYLENOL) 500 MG tablet Take 500 mg by mouth every 6 (six) hours as needed for moderate pain.     allopurinol (ZYLOPRIM) 300 MG tablet Take 300 mg by mouth daily.     apixaban (ELIQUIS) 2.5 MG TABS tablet Take by mouth 2 (two) times daily.     BERBERINE CHLORIDE PO Take 1,500 mg by mouth.     Coenzyme Q10 (CO Q 10 PO) Take by mouth.     colchicine 0.6 MG tablet Take 0.6 mg by mouth daily.     JARDIANCE 10 MG TABS tablet Take 10 mg by mouth daily.     leflunomide (ARAVA) 10 MG tablet Take 1 tablet by mouth daily.     nebivolol (BYSTOLIC) 5 MG tablet Take 5 mg by mouth daily.     nitroGLYCERIN (NITROSTAT) 0.4 MG SL tablet Place 1 tablet (0.4 mg total) under the tongue every 5 (five) minutes as needed for chest pain. (Patient not taking: Reported on 09/04/2022) 75 tablet 2   NON FORMULARY 500 mg. Beef liver (Patient not taking: Reported on 09/24/2022)     Omega-3 Fatty Acids (OMEGA-3 FISH OIL  PO) Take 1 capsule by mouth daily. (Patient not taking: Reported on 09/24/2022)     pravastatin (PRAVACHOL) 20 MG tablet Take 1 tablet (20 mg total) by mouth every evening. (Patient not taking: Reported on 09/04/2022) 90 tablet 3   telmisartan (MICARDIS) 80 MG tablet Take 1 tablet (80 mg total) by mouth daily. (Patient not taking: Reported on 09/24/2022) 90 tablet 3   thyroid (ARMOUR) 90 MG tablet Take 90 mg by mouth every morning.      triamcinolone ointment (KENALOG) 0.5 % Apply 1 Application topically 2 (two) times daily. 30 g 0   Trospium Chloride 60 MG CP24 TAKE 1 CAPSULE BY MOUTH DAILY (Patient taking differently: Take 1 capsule by mouth daily.) 30 capsule 2   TURMERIC PO Take 720 mg by mouth.     VTAMA 1 % CREA APPLY 1 APPLICATION TOPICALLY DAILY 60 g 5   Current Facility-Administered Medications  Medication Dose Route Frequency Provider Last Rate Last Admin   sodium chloride flush (NS) 0.9 % injection 3 mL  3 mL Intravenous Q12H Lennette Bihari, MD        Facility-Administered Medications Ordered in Other Visits  Medication Dose Route Frequency Provider Last Rate Last Admin   ondansetron (ZOFRAN) 4 mg in sodium chloride 0.9 % 50 mL IVPB  4 mg Intravenous Q6H PRN Coletta Memos, MD        VITAL SIGNS: There were no vitals taken for this visit. There were no vitals filed for this visit.  Estimated body mass index is 24.75 kg/m as calculated from the following:   Height as of an earlier encounter on 09/24/22: 5\' 7"  (1.702 m).   Weight as of an earlier encounter on 09/24/22: 158 lb (71.7 kg).  LABS: CBC:    Component Value Date/Time   WBC 8.3 09/04/2022 1147   WBC 9.3 08/28/2022 1631   HGB 11.7 (L) 09/04/2022 1147   HGB 11.1 (L) 07/21/2022 1426   HCT 36.9 (L) 09/04/2022 1147   HCT 35.1 (L) 07/21/2022 1426   PLT 252 09/04/2022 1147   PLT 233 07/21/2022 1426   MCV 87.0 09/04/2022 1147   MCV 89 07/21/2022 1426   MCV 96 12/09/2011 1046   NEUTROABS 6.2 08/28/2022 1631   NEUTROABS 4.1 07/25/2020 1543   LYMPHSABS 1.7 08/28/2022 1631   LYMPHSABS 2.6 07/25/2020 1543   MONOABS 0.9 08/28/2022 1631   EOSABS 0.3 08/28/2022 1631   EOSABS 0.3 07/25/2020 1543   BASOSABS 0.1 08/28/2022 1631   BASOSABS 0.1 07/25/2020 1543   Comprehensive Metabolic Panel:    Component Value Date/Time   NA 138 08/28/2022 1631   NA 141 05/23/2021 1151   NA 136 12/09/2011 1046   K 3.9 08/28/2022 1631   K 4.2 12/09/2011 1046   CL 106 08/28/2022 1631   CL 101 12/09/2011 1046   CO2 26 08/28/2022 1631   CO2 25 12/09/2011 1046   BUN 18 08/28/2022 1631   BUN 25 05/23/2021 1151   BUN 31 (H) 12/09/2011 1046   CREATININE 0.96 08/28/2022 1631   CREATININE 1.10 12/26/2015 0828   GLUCOSE 160 (H) 08/28/2022 1631   GLUCOSE 242 (H) 12/09/2011 1046   CALCIUM 9.2 08/28/2022 1631   CALCIUM 8.8 12/09/2011 1046   AST 25 08/28/2022 1631   AST 22 12/09/2011 1046   ALT 29 08/28/2022 1631   ALT 28 12/09/2011 1046   ALKPHOS 193 (H) 08/28/2022 1631   ALKPHOS 67  12/09/2011 1046   BILITOT 0.5 08/28/2022 1631   BILITOT  0.7 12/09/2011 1046   PROT 6.7 08/28/2022 1631   PROT 7.1 12/09/2011 1046   ALBUMIN 3.8 08/28/2022 1631   ALBUMIN 3.5 12/09/2011 1046    RADIOGRAPHIC STUDIES: ECHOCARDIOGRAM COMPLETE  Result Date: 09/17/2022    ECHOCARDIOGRAM REPORT   Patient Name:   DUTCH ING Date of Exam: 09/17/2022 Medical Rec #:  [id number]       Height:       67.0 in Accession #:    [id number]      Weight:       170.0 lb Date of Birth:  [birth year]       BSA:          1.887 m Patient Age:    81 years        BP:           143/62 mmHg Patient Gender: M               HR:           77 bpm. Exam Location:  Outpatient Procedure: 2D Echo, Cardiac Doppler, Color Doppler and 3D Echo Indications:    Dyspnea R06.00  History:        Patient has prior history of Echocardiogram examinations, most                 recent 04/08/2021. CHF, CAD, Pacemaker, Arrythmias:Atrial                 Fibrillation, Signs/Symptoms:Chest Pain and Dyspnea; Risk                 Factors:Sleep Apnea, Diabetes, Hypertension, Dyslipidemia and                 GERD.  Sonographer:    Eulah Pont RDCS Referring Phys: [id number] MURALI RAMASWAMY IMPRESSIONS  1. Left ventricular ejection fraction, by estimation, is 45 to 50%. Left ventricular ejection fraction by 2D MOD biplane is 43.7 %. The left ventricle has mildly decreased function. The left ventricle demonstrates global hypokinesis. Left ventricular diastolic parameters are consistent with Grade II diastolic dysfunction (pseudonormalization). Elevated left ventricular end-diastolic pressure.  2. Right ventricular systolic function is moderately reduced. The right ventricular size is normal. There is severely elevated pulmonary artery systolic pressure.  3. Left atrial size was severely dilated.  4. Right atrial size was mildly dilated.  5. The mitral valve is normal in structure. Mild mitral valve regurgitation. No evidence of mitral stenosis.  6. Tricuspid valve  regurgitation is moderate.  7. The aortic valve is tricuspid. Aortic valve regurgitation is mild. No aortic stenosis is present. Aortic regurgitation PHT measures 592 msec.  8. The inferior vena cava is normal in size with greater than 50% respiratory variability, suggesting right atrial pressure of 3 mmHg. FINDINGS  Left Ventricle: Left ventricular ejection fraction, by estimation, is 45 to 50%. Left ventricular ejection fraction by 2D MOD biplane is 43.7 %. The left ventricle has mildly decreased function. The left ventricle demonstrates global hypokinesis. The left ventricular internal cavity size was normal in size. There is no left ventricular hypertrophy. Left ventricular diastolic parameters are consistent with Grade II diastolic dysfunction (pseudonormalization). Elevated left ventricular end-diastolic pressure. Right Ventricle: The right ventricular size is normal. No increase in right ventricular wall thickness. Right ventricular systolic function is moderately reduced. There is severely elevated pulmonary artery systolic pressure. The tricuspid regurgitant velocity is 3.39 m/s, and with an assumed right atrial pressure of 15 mmHg, the estimated right ventricular systolic pressure is 61.0  mmHg. Left Atrium: Left atrial size was severely dilated. Right Atrium: Right atrial size was mildly dilated. Pericardium: There is no evidence of pericardial effusion. Mitral Valve: The mitral valve is normal in structure. Mild mitral annular calcification. Mild mitral valve regurgitation. No evidence of mitral valve stenosis. MV peak gradient, 6.7 mmHg. The mean mitral valve gradient is 2.0 mmHg. Tricuspid Valve: The tricuspid valve is normal in structure. Tricuspid valve regurgitation is moderate . No evidence of tricuspid stenosis. Aortic Valve: The aortic valve is tricuspid. Aortic valve regurgitation is mild. Aortic regurgitation PHT measures 592 msec. No aortic stenosis is present. Pulmonic Valve: The pulmonic  valve was normal in structure. Pulmonic valve regurgitation is mild. No evidence of pulmonic stenosis. Aorta: The aortic root is normal in size and structure. Venous: The inferior vena cava is normal in size with greater than 50% respiratory variability, suggesting right atrial pressure of 3 mmHg. IAS/Shunts: No atrial level shunt detected by color flow Doppler. Additional Comments: A device lead is visualized.  LEFT VENTRICLE PLAX 2D                        Biplane EF (MOD) LVIDd:         5.10 cm         LV Biplane EF:   Left LVIDs:         3.80 cm                          ventricular LV PW:         0.90 cm                          ejection LV IVS:        0.90 cm                          fraction by LVOT diam:     2.00 cm                          2D MOD LV SV:         55                               biplane is LV SV Index:   29                               43.7 %. LVOT Area:     3.14 cm                                Diastology                                LV e' medial:    5.57 cm/s LV Volumes (MOD)               LV E/e' medial:  20.5 LV vol d, MOD    81.2 ml       LV e' lateral:   10.60 cm/s A2C:  LV E/e' lateral: 10.8 LV vol d, MOD    95.7 ml A4C: LV vol s, MOD    44.9 ml A2C: LV vol s, MOD    53.5 ml       3D Volume EF: A4C:                           3D EF:        40 % LV SV MOD A2C:   36.3 ml       LV EDV:       157 ml LV SV MOD A4C:   95.7 ml       LV ESV:       95 ml LV SV MOD BP:    39.3 ml       LV SV:        63 ml RIGHT VENTRICLE RV S prime:     10.80 cm/s TAPSE (M-mode): 1.6 cm LEFT ATRIUM             Index        RIGHT ATRIUM           Index LA diam:        4.90 cm 2.60 cm/m   RA Area:     19.00 cm LA Vol (A2C):   61.0 ml 32.32 ml/m  RA Volume:   55.30 ml  29.30 ml/m LA Vol (A4C):   86.2 ml 45.67 ml/m LA Biplane Vol: 78.0 ml 41.33 ml/m  AORTIC VALVE             PULMONIC VALVE LVOT Vmax:   92.00 cm/s  PR End Diast Vel: 7.84 msec LVOT Vmean:  57.200 cm/s LVOT VTI:     0.176 m AI PHT:      592 msec  AORTA Ao Root diam: 2.90 cm Ao Asc diam:  3.60 cm MITRAL VALVE                  TRICUSPID VALVE MV Area (PHT): 5.27 cm       TR Peak grad:   46.0 mmHg MV Area VTI:   1.65 cm       TR Vmax:        339.00 cm/s MV Peak grad:  6.7 mmHg MV Mean grad:  2.0 mmHg       SHUNTS MV Vmax:       1.29 m/s       Systemic VTI:  0.18 m MV Vmean:      71.8 cm/s      Systemic Diam: 2.00 cm MV Decel Time: 144 msec MR Peak grad:    85.4 mmHg MR Mean grad:    52.0 mmHg MR Vmax:         462.00 cm/s MR Vmean:        344.0 cm/s MR PISA:         1.01 cm MR PISA Eff ROA: 9 mm MR PISA Radius:  0.40 cm MV E velocity: 114.00 cm/s MV A velocity: 50.70 cm/s MV E/A ratio:  2.25 Chilton Si MD Electronically signed by Chilton Si MD Signature Date/Time: 09/17/2022/6:01:20 PM    Final    CT Chest High Resolution  Result Date: 09/08/2022 CLINICAL DATA:  Follow-up interstitial lung disease.  Former smoker. EXAM: CT CHEST WITHOUT CONTRAST TECHNIQUE: Multidetector CT imaging of the chest was performed following the standard protocol without intravenous contrast. High resolution imaging of the lungs, as well as inspiratory  and expiratory imaging, was performed. RADIATION DOSE REDUCTION: This exam was performed according to the departmental dose-optimization program which includes automated exposure control, adjustment of the mA and/or kV according to patient size and/or use of iterative reconstruction technique. COMPARISON:  09/24/2021 high-resolution chest CT. FINDINGS: Cardiovascular: New mild cardiomegaly with stable 3 lead left subclavian pacemaker with lead tips in the right atrium and right ventricular apex. No significant pericardial effusion/thickening. Three-vessel coronary atherosclerosis. Atherosclerotic nonaneurysmal thoracic aorta. Dilated main pulmonary artery (4.1 cm diameter). Mediastinum/Nodes: No significant thyroid nodules. Unremarkable esophagus. No axillary adenopathy. New mild right  paratracheal adenopathy up to 1.1 cm (series 2/image 51). No discrete hilar adenopathy on these noncontrast images. Lungs/Pleura: No pneumothorax. Small dependent right pleural effusion. No significant left pleural effusion. No acute consolidative airspace disease, lung masses or significant pulmonary nodules. Mild patchy air trapping in both lungs on the expiration sequence, similar. No evidence of tracheobronchomalacia. Mild-to-moderate interlobular septal thickening, subpleural reticulation and ground-glass opacity and mild traction bronchiectasis and architectural distortion throughout both lungs with no frank honeycombing. Slight basilar predominance to these findings. The septal thickening has worsened in the interval. Upper abdomen: Cholecystectomy. Nonobstructing 9 mm upper left renal stone. Musculoskeletal: No aggressive appearing focal osseous lesions. Moderate thoracic spondylosis. IMPRESSION: 1. Spectrum of findings suggestive of new mild congestive heart failure superimposed on chronic fibrotic interstitial lung disease. New mild cardiomegaly. Small dependent right pleural effusion. Worsened generalized pulmonary septal thickening compatible with cardiogenic pulmonary edema. 2. Assessment of progression of the underlying chronic interstitial lung disease is confounded by the above findings. Given mild air trapping, fibrotic NSIP remains the favored diagnosis, with UIP not excluded. Findings are indeterminate for UIP per consensus guidelines: Diagnosis of Idiopathic Pulmonary Fibrosis: An Official ATS/ERS/JRS/ALAT Clinical Practice Guideline. Am Rosezetta Schlatter Crit Care Med Vol 198, Iss 5, [phone], Jan 24 2017. 3. Dilated main pulmonary artery, suggesting pulmonary arterial hypertension. 4. Three-vessel coronary atherosclerosis. 5. Nonobstructing left nephrolithiasis. 6.  Aortic Atherosclerosis (ICD10-I70.0). Electronically Signed   By: Delbert Phenix M.D.   On: 09/08/2022 08:24    PERFORMANCE STATUS (ECOG) :  1 - Symptomatic but completely ambulatory  Review of Systems Unless otherwise noted, a complete review of systems is negative.  Physical Exam General: NAD Extremities: no edema, no joint deformities Skin: Trailing erythema proximally from IV site of right arm to mid upper arm.  MSK: No tenderness, warmth or palpable abnormality  Neurological: Weakness but otherwise nonfocal  Assessment and Plan- Patient is a 82 y.o. male    Encounter Diagnosis  Name Primary?   Phlebitis Yes    Phlebitis is new, grade 1. Appears secondary to his IV Venofer. No signs of blood clot formation but we did discussed red flag signs and symptoms that would indicate that a doppler would be needed to rule this out of pain/swelling occurs. For now we have stopped his IV Venofer infusion and have started an IV saline flush. Topical steroid cream sent to pharmacy for him to apply BID. We discussed that I would recommend we use his other arm next week for his IV Venofer treatment to avoid further irritation.    Patient expressed understanding and was in agreement with this plan. He also understands that He can call clinic at any time with any questions, concerns, or complaints.   Thank you for allowing me to participate in the care of this very pleasant patient.   Time Total: 25  Visit consisted of counseling and education dealing with the complex and emotionally intense  issues of symptom management in the setting of serious illness.Greater than 50%  of this time was spent counseling and coordinating care related to the above assessment and plan.  Signed by: Clent Jacks, PA-C

## 2022-09-24 NOTE — Patient Instructions (Addendum)
  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 REcently stable.  Hx of covid March 2023 - did not affect your fibrosis.  Stable in the last 1 month despite stopping pirfenidone due to weight loss and other side effects  Plan - -Continue monitor without pirfenidone because of the weight loss -  Dysgeusia Unintentional weight loss - despite stopping esbriet early April 2024  -Another 20 pound weight loss despite stopping pirfenidone.  Reasons could be your iron deficiency anemia or Jardiance or Areva  -Heart failure and pulmonary fibrosis can cause weight loss but only when it is more advanced  -Noted that taste is some better since stopping pirfenidone  Plan - Continue to hold pirfenidone - Talk to your doctors about weight loss risk with Jardiance and with Areva  Fatigue t -It is severe despite talking pirfenidone.  I think it is multifactorial and related to your medications and also new onset heart failure and iron deficiency anemia. Plan - According to primary care physician   Followup -4 to 6 weeks return to see Dr. Marchelle Gearing or nurse practitioner

## 2022-09-24 NOTE — Patient Instructions (Signed)
Medication Instructions:  Start Aldactone 25 mg take 1/2 tablet ( 12.5 mg ) daily Continue all other medications *If you need a refill on your cardiac medications before your next appointment, please call your pharmacy*   Lab Work: None ordered   Testing/Procedures: None ordered   Follow-Up: At [facility], you and your health needs are our priority.  As part of our continuing mission to provide you with exceptional heart care, we have created designated Provider Care Teams.  These Care Teams include your primary Cardiologist (physician) and Advanced Practice Providers (APPs -  Physician Assistants and Nurse Practitioners) who all work together to provide you with the care you need, when you need it.  We recommend signing up for the patient portal called "MyChart".  Sign up information is provided on this After Visit Summary.  MyChart is used to connect with patients for Virtual Visits (Telemedicine).  Patients are able to view lab/test results, encounter notes, upcoming appointments, etc.  Non-urgent messages can be sent to your provider as well.   To learn more about what you can do with MyChart, go to [email or web address].    Your next appointment:  4 months    Provider:  Azalee Course PA   Dr.Kelly in 8 months

## 2022-09-24 NOTE — Progress Notes (Unsigned)
PCP [name], MD  HPI   IOV 07/10/2017  Chief Complaint  Patient presents with   Advice Only    Referred by [facility] due to SOB.  PFT done 05/28/17.  Pt has been having issues with SOB x4 months especially with exertion and has some mild chest tightness. Denies any cough.    82 year old male referred by [name] for evaluation of shortness of breath after cardiac etiologies ruled out.  He tells me that he is a remote smoker.  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  He has a long-standing history of rheumatoid arthritis followed by Dr. Jefm Bryant in [place].  He is to be on methotrexate for many years and stopped taking it because of cardiac dysfunction [he personally is convinced that methotrexate because this].  He was then on leflunomide as of 2017 but is currently not on it.  His last rheumatoid factor and CCP antibodies were negative on my personal chart review of the outside records in 2016.    Now for the last 3 or 6 months he is got insidious onset of shortness of breath that is slowly progressive.  His dyspnea on exertion relieved by rest.  Class II-3 activities.  There I  s no associated cough or orthopnea proximal nocturnal dyspnea.  He did have some edema but this got cleared up but the dyspnea is continuing to get worse.  In the last few months he has had a cardiac echo that showed pulmonary hypertension.  Did have cardiac stress test that is normal.  Had pulmonary function test that showed isolated reduction in diffusion capacity and therefore he has been referred here.  Walking desaturation test on 07/10/2017 185 feet x 3 laps on ROOM AIR:  did NOT desaturate. Rest pulse ox was 100%, final pulse ox was 98%. HR response 60/min at rest to 121/min at peak exertion. Patient [name]  Did not Desaturate < 88% . [name] did not  Desaturated  </= 3% points. Gerda Diss Acocella yes did get tachyardic   OV 08/04/2017  Chief Complaint  Patient presents with   Follow-up    ILD    Follow-up interstitial lung disease workup  After the last visit no interim problems.  He has some chronic pedal edema that he will talk to about with his primary care physician.  He did see Dr. Jefm Bryant rheumatologist in July 15, 2017.  I reviewed his notes.  It is not specifically indicate patient has nonspecific seronegative arthritis with a differential diagnosis of seronegative rheumatoid arthritis versus psoriatic.  Patient still seems to think the root of all his problems is the methotrexate he took for over 10 years.  At this point in time there is no decompensation.  As part of the ILD workup his pulmonary function test shows mild reduction in diffusion capacity.  Correlating with this is evidence of pulmonary hypertension on the echo and high-resolution CT chest enlarged pulmonary arteries.  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  His autoimmune panel is negative.  And so the vasculitis panel and hypersensitivity pneumonitis panel.    IMPRESSION: 1. Mild subpleural reticular densities in the posterolateral aspects of both lower lobes are suspicious for mild fibrotic interstitial lung disease such as  nonspecific interstitial pneumonitis. Early/mild usual interstitial pneumonitis is not excluded. 2. Aortic atherosclerosis (ICD10-170.0). Coronary artery calcification. 3. Enlarged pulmonary arteries, indicative of pulmonary arterial Hypertension.- > in echo  Nov 2018: Pulmonary arteries: Systolic pressure was moderately increased.   PA peak pressure: 55 mm Hg (S). 4. Left renal stone, partially imaged.     Electronically Signed   By: Lorin Picket M.D.   On: 07/22/2017 15:03  OV 11/03/2017  Chief Complaint  Patient presents with   Follow-up    Pt has SOB with  exertion, some dry cough.     Follow-up suspected interstitial lung disease in the setting of rheumatoid arthritis and long methotrexate intake  He presents with his wife.  At the time of last visit I was not fully convinced that he had interstitial lung disease.  His CT scan indicated presence of pulmonary hypertension and so did his echocardiogram.  Therefore I referred him back to [name] cardiology.  In the spring 2019 he did have a right heart catheterization and left heart catheterization that showed mild pulmonary hypertension but also coronary artery blockage.  He status post 2 stents.  After that his shortness of breath improved but he tells me overall his fatigue level has not improved.  In talking to him I find out that he exercises 5 times a week doing heart track 2 times a week and the other 3 days walking a mile each time and doing weight lifting.  It appears that he takes a 20-minute nap after these exercises and then feels reenergized.  His mother feels that he does not have the effort tolerance as his younger days but he denies having any symptoms of chest pain or shortness of breath or cough when he does these heavy exertion the weight lifting or walking a mile out doing heart track.  In fact in the walking desaturation test today he walked extremely fast and had no problems.  In terms of his possible interstitial lung disease he had pulmonary function test today and felt to show some improvement and on exam he does not have any crackles.  Also his wife tells me that for the last 2 months he is using CPAP for sleep apnea and this is also helping him.  Right Heart Pressures R[facility] Procedural Findings: Hemodynamics (mmHg) RA mean 2 RV 42/6 PA 42/8, mean 21 PCWP mean 7 LV 134/8 AO 147/52       OV 06/01/2019  Subjective:  Patient ID: [name], male , DOB: [birth year] , age 32 y.o. , MRN: [id number] , ADDRESS: [place] [id number]   06/01/2019 -   Chief  Complaint  Patient presents with   Follow-up    Pt states he has been doing okay since last visit but states he has been having a little more SOB x4 weeks now. Pt also has occ coughing with yellow phlegm.   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 -returns for follow-up.  I personally have not seen him since the summer 2019.  He says overall he has been stable.  In August 2020 he had a CT scan of the chest that confirmed the presence of interstitial lung disease in the setting of his rheumatoid arthritis.  He was stable.  His pulmonary function test was stable.  He says now in the last 2 months has had a decline in shortness of breath.  There is also some cough with sputum production but that has resolved.  It is definitely present with exertion but relieved by rest.  His walking desaturation test compared to 18 months ago is roughly the same except that he is very tachycardic.  He does have a pacemaker.  He has an appoint with [name] his electrophysiologist today.  His symptom scores are listed below.     ROS - per HPI     OV 08/01/2019 - telephine visit - identified with 2 person identifier. Telephone visit - limits, risks benefits explained  Subjective:  Patient ID: [name], male , DOB: 1941-02-04 , age 60 y.o. 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 - similar dyspnea compared to JAn 2021.  No better nor worse.  After last visit he has seen cardiology x2.  It appears the final conclusion is that diastolic dysfunction might be contributing to his shortness of breath.  But overall not major changes in his cardiac care.  He tells me that overall he is stable.  He had spirometry and DLCO the DLCO itself appears to be  reduced compared to the recent 1 but stable compared to older ones.  The FVC suggest decline.  Patient himself feels stable.  Overall some mixed picture.  Last high-resolution CT chest was October 2020.    IMPRESSION: 1. 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 There is no significant air trapping on expiratory phase imaging. Findings are not significantly changed compared to prior examinations and remain in an "indeterminate for UIP" pattern by ATS pulmonary fibrosis criteria, differential considerations including both UIP and NSIP.   2.  Coronary artery disease and aortic atherosclerosis.   3.  Left nephrolithiasis.     Electronically Signed   By: Eddie Candle M.D.   On: 01/12/2019 15:08    OV 10/17/2019  Subjective:  Patient ID: [name], male , DOB: Nov 20, 1940 , age 82 y.o. , MRN: [id number] , ADDRESS: [place]  [id number]   10/17/2019 -   Chief Complaint  Patient presents with   Follow-up    pt states sobwhen doing activities.   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 -presents for follow-up after having his spirometry and DLCO and high-resolution CT chest.  Overall he feels stable compared to the last visit but he says definitely compared to 2 years ago his symptoms are worse.  Compared to 1 year ago his symptoms are the same.  He is kind of leery of taking new medications.  His high-resolution CT scan of the chest indicates mild progression since 2019 February.  His pulmonary function tests also indicate progression compared to 2 years ago but fluctuant in more recent times.  He is reporting agent orange exposure and is  wondering if his ILD could be related to that.  He reminded me that he is already on the ILD-pro registry study.  His next scheduled visit is in October 2021.   High-resolution CT chest May 2021 Lungs/Pleura: Peripheral and basilar predominant subpleural interlobular and intra  lobular septal thickening and ground-glass. Findings persist on prone imaging and appear similar to 01/12/2019 but may be minimally progressive from 07/22/2017. 4 mm peripheral left lower lobe nodule (14/101), stable from 07/22/2017 and considered benign. Lungs are otherwise clear. No pleural fluid. Airway is unremarkable. Mild air trapping.   Upper Abdomen: Visualized portions of the liver and adrenal glands are unremarkable. Stones are seen in the kidneys bilaterally. Spleen and visualized portions of the pancreas, stomach and bowel are grossly unremarkable. Cholecystectomy. No upper abdominal adenopathy.   Musculoskeletal: Degenerative changes in the spine. No worrisome lytic or sclerotic lesions.   IMPRESSION: 1. Pulmonary parenchymal pattern of fibrosis appears grossly stable from 01/12/2019 but may be minimally progressive from 07/22/2017. 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 Usual interstitial pneumonitis is certainly not excluded. Findings are indeterminate for UIP per consensus guidelines: Diagnosis of Idiopathic Pulmonary Fibrosis: An Official ATS/ERS/JRS/ALAT Clinical Practice Guideline. [place], Iss 5, [id number], Jan 24 2017. 2. Bilateral renal stones. 3. Aortic atherosclerosis (ICD10-I70.0). Coronary artery calcification. 4. Enlarged pulmonic trunk, indicative of pulmonary arterial hypertension.     Electronically Signed   By: Lorin Picket M.D.   On: 10/10/2019 14:00  ROS - per HPI     OV 06/29/2020  Subjective:  Patient ID: [name], male , DOB: May 08, 1941 , age 17 y.o. , MRN: [id number] , ADDRESS: [address] [place] [address] PCP [name], MD Patient Care Team: [name], MD as PCP - General (Unknown Physician Specialty) [name], MD as PCP - Electrophysiology (Cardiology) Troy Sine, MD as PCP - Cardiology (Cardiology) Sueanne Margarita, MD as PCP - Sleep Medicine (Cardiology)  This Provider for this visit: Treatment Team:  Attending Provider: Brand Males, MD    06/29/2020 -   Chief Complaint  Patient presents with   Follow-up    SOB unchanged, slight nonproductive cough. Esbriet doing well.   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 Normal echo Jan 2021   HPI [name] 82 y.o. -returns for follow-up.  He presents with his wife.  Last seen in May 2021.  After that in June 2021 when he started pirfenidone for progressive ILD.  He tells me that he has been tolerating pirfenidone just fine.  Of note he has not had any liver function test since he started pirfenidone.  He is not having any GI side effects of skin side effects from the pirfenidone.  In the last 6 months his ILD stable according to his history.  His walking desaturation test and symptom scores are stable.  He is up-to-date with his Covid vaccine.  He is on leflunomide.  He is on the ILD-pro registry study with a last visit was in November 2021.  He needs a visit every 6 months.  He is willing to get a Covid IgG checked because he is immunosuppressed.  This is in response to humoral immunity to vaccine     OV 04/09/2021  Subjective:  Patient ID: [name], male , DOB: [birth year] , age 53 y.o. 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 Normal echo Jan 2021  HPI [name] 82 y.o. -returns for follow-up.  He tells me that in the last 6 months since his last visit he is having progressively more shortness of breath with exertion relieved by rest.  In fact his dyspnea score shows worsening to a total score of 9 compared to earlier in the year.  But there is no worsening cough.  He had pulmonary function test that shows continued stability since 2021 feb but definitely worse since 2019 and 2020.  In other words the stability correlates with him starting pirfenidone.  Explained to him that the pirfenidone is helping his ILD stability.  Nevertheless his symptoms are worse.  He had echocardiogram yesterday that shows continued elevation in pulmonary artery pressures [2019 borderline elevation] and grade 2 diastolic dysfunction.  Of note he had his ILD-Pro registry research visit today. Last liver function test for drug-induced liver injury monitoring was in February 2022.  GFR at that time was 57.  In March 2022.  His wife has suspected ILD and she is also with him in this visit.  I saw her for a scheduled visit just earlier.      OV  06/13/2021  Subjective:  Patient ID: [name], male , DOB: June 15, 1940 , age 2 y.o. , MRN: [id number] , ADDRESS: [place] [address] PCP [name], MD Patient Care Team: [name], MD as PCP - General (Unknown Physician Specialty) [name], MD as PCP - Electrophysiology (Cardiology) Troy Sine, MD as PCP - Cardiology (Cardiology) Sueanne Margarita, MD as PCP - Sleep Medicine (Cardiology)  This Provider for this visit: Treatment Team:  Attending Provider: Brand Males, MD    06/13/2021 -   Chief Complaint  Patient presents with   Follow-up    Pt is following up after recent heart cath.    HPI DERAL SCHELLENBERG 82 y.o. -returns for follow-up to discuss the implications of the right heart cath results.  He had right heart cath on 05/29/2021.  Around this time he also had some mild anemia that is documented below.  But his recent creatinine is normal.  His right heart cath shows worsening of mean arterial pressure compared to a few years ago but his PVR is low and his wedge is high.  He also has moderate mitral regurgitation and had a high V wave.  Compared to last visit he stable but over time he said worsening dyspnea.  He is here with his wife.  I told them both that this looks like pulmonary venous hypertension.  He does not have edema.  We discussed the possibility of doing Lasix  therapy.  I told him he does not meet official criteria for inhaled treprostinil and WHO group 3 pulmonary hypertension.  For inhaled treprostinil he would need a wedge less than 15 and a PVR greater than 3.  He does not have that.  He is agreed to do Lasix at this point.  He will have to follow-up with Dr. [name] for this.  In terms of his interstitial lung disease he has a surveillance CT scan coming up in March 2023 [last CT was then May 2021].  I think we will postpone this and just see him in 4 to 6 months for ILD follow-up given recent stability and  pulmonary function test.  What he needs is follow-up for his shortness of breath and to see how the Lasix is performing.  For this we will make a nurse practitioner visit and also encouraged him to go and see Dr. Claiborne Billings.  CT Chest data  No results found.   07/11/2021: Today - follow up Patient presents today with wife via virtual visit for follow up after being started on lasix for mild pulmonary HTN. He states he started the medication but has since stopped due to some bladder issues. He reports his breathing has been stable and overall, he feels pretty well. He denies any lower extremity swelling, palpitations, orthopnea, PND, cough or wheezing. He is doing well on Esbriet. He has developed a rash over the last 3-4 days after being transitioned from metformin to [place]. He denies any facial or tongue swelling, difficulties breathing or swallowing, or N/V.     OV 10/01/2021  Subjective:  Patient ID: [name], male , DOB: 1941/04/07 , age 82 y.o. , MRN: [id number] , ADDRESS: [place] [address] PCP [name], MD Patient Care Team: [name], MD as PCP - General (Unknown Physician Specialty) [name], MD as PCP - Electrophysiology (Cardiology) Troy Sine, MD as PCP - Cardiology (Cardiology) Sueanne Margarita, MD as PCP - Sleep Medicine (Cardiology)  This Provider for this visit: Treatment Team:  Attending Provider: Brand Males, MD    10/01/2021 -   Chief Complaint  Patient presents with   Follow-up    PFT performed today.  Pt states he has been doing good since last visit and denies any complaints.    HPI KINGSLY KLOEPFER 82 y.o. -returns for routine follow-up.  After I saw him in January 2023 we gave him a trial of Lasix because of diastolic dysfunction and pulmonary venous hypertension but this did not help.  He denied acute video visit in March 2023 because of COVID-19.  He took antiviral.  Currently doing well except for  the fact that he has had increased fatigue compared to baseline [the symptom score].  The fatigue is significant especially by the afternoon.  Also for the last few weeks he has had diarrhea that is moderate in intensity.  There is no changes in his medications.  Over time his creatinine has gotten worse.  He does have chronic kidney disease and is seen his primary care.  He also tells me that he was taking potassium and recently has had hyperkalemia.  This was getting worse as of yesterday with a potassium of 6.1 mEq.  This was at outside facility and I was able to review the lab results.  His primary care is intently watching this.  He is off his diuretics and potassium supplementation.  I have instructed him to have  repeat potassium today.  In terms of his ILD it is stable.  Pulmonary function tests are stabilized.  His CT scan of the chest was reviewed and this shows a stable ILD in the last 2 years.  Unclear to me if he is still on leflunomide or not. He is supposed have a research registry follow-up visit today.  Creatinine profile shows a slow worsening over time.          OV 11/27/2021  Subjective:  Patient ID: [name], male , DOB: Nov 18, 1940 , age 56 y.o. , MRN: [id number] , ADDRESS: [place] [id number] PCP [name], MD Patient Care Team: [name], MD as PCP - General (Unknown Physician Specialty) [name], MD as PCP - Electrophysiology (Cardiology) Troy Sine, MD as PCP - Cardiology (Cardiology) Sueanne Margarita, MD as PCP - Sleep Medicine (Cardiology)  This Provider for this visit: Treatment Team:  Attending Provider: Brand Males, MD    11/27/2021 -   Chief Complaint  Patient presents with   Follow-up    Pt to discuss Esbriet. Pt still doing good since LOV.    HPI [name] 82 y.o. -returns for follow-up.  At the last visit in May 2023 because of rising creatinine we reduce his pirfenidone dose  particularly in the setting of fatigue.  It seems lowering the pirfenidone dose does not really help the fatigue but it does seem to overall make him feel better.  He is stable.  His dyspnea is better.  His diarrhea is very minimal to nonexistent.  He had pulmonary function test and this shows continued stability since being on pirfenidone.  He had ILD-Pro registry visit in the spring 2023.  His next visit is due sometime around October 2023.  We discussed his rising creatinine.  I advised him to stay on the lower dose of the pirfenidone.  This would be 2 pills 3 times daily.  He did indicate and his wife also indicated that he is having difficult time controlling his hyperkalemia.  Review of his medication shows that he is on ARB s [telmisartan].  Discussed this is a potential contributory etiology for his hyperkalemia.  He is willing to get his creatinine and his potassium checked today in the office.  But his primary care is the one who is managing that.   Noted he is on fish oil but he says this actually helps him with his arthritis.     OV 05/06/2022  Subjective:  Patient ID: [name], male , DOB: [birth year] , age 33 y.o. , MRN: [id number] , ADDRESS: [place] [id number] PCP [name], MD Patient Care Team: [name], MD as PCP - General (Unknown Physician Specialty) [name], MD as PCP - Electrophysiology (Cardiology) Troy Sine, MD as PCP - Cardiology (Cardiology) Sueanne Margarita, MD as PCP - Sleep Medicine (Cardiology) [place], Tami Lin, [place] as Physician Assistant (Cardiology)  This Provider for this visit: Treatment Team:  Attending Provider: Brand Males, MD    05/06/2022 -   Chief Complaint  Patient presents with   Follow-up    Pt states he has been doing okay since last visit and denies any complaints.     Marland Kitchen  HPI [name] 81 y.o. -returns for follow-up.  Presents with his wife.  He feels he is doing  stable.  Symptom score shows stability but to me he obviously looks  like he has lost weight.  He then told me that ever since COVID earlier in the year he is having dysgeusia and low appetite and therefore he is losing weight.  Review of the records indicate he is lost 20 pounds in the last 1 year.  I did indicate to him that concern of COVID causing his dysgeusia is pirfenidone could be causing his dysgeusia and low appetite and weight loss.  He is willing to give a short holiday and reassess.  He has not had pulmonary function test recently but he feels stable.  He is due for monitoring safety labs with his pirfenidone high risk medication.  He continues on immunosuppression.      OV 08/28/2022  Subjective:  Patient ID: [name], male , DOB: [birth year] , age 19 y.o. , MRN: [id number] , ADDRESS: [address] [place] [address] PCP Marguarite Arbour, MD Patient Care Team: Marguarite Arbour, MD as PCP - General (Unknown Physician Specialty) Hillis Range, MD (Inactive) as PCP - Electrophysiology (Cardiology) Lennette Bihari, MD as PCP - Cardiology (Cardiology) Quintella Reichert, MD as PCP - Sleep Medicine (Cardiology) Duke, Roe Rutherford, PA as Physician Assistant (Cardiology)  This Provider for this visit: Treatment Team:  Attending Provider: Kalman Shan, MD    08/28/2022 -   Chief Complaint  Patient presents with   Follow-up    Fatigue, sob  HPI [name] 82 y.o. -last seen in December 2023.  At that time he was having dysgeusia after COVID.  Also having weight loss.  We do not know if pirfenidone was involved so told him to stop it for a few weeks which she did but it did not really resolve his symptoms.  He presents for follow-up but it appears in January 2024 [actually between 05/23/2022 - 05/30/2022] he was admitted with melena and upper GI bleed got 3 units of blood.  Extensive endoscopy colonoscopy and camera test were all negative.  Since then they have been  monitoring his hemoglobin at home and it is improved.  He did follow-up with Dr. Sherin Quarry on 07/01/2022 at GI clinic.  He states ever since then he feels a lot more fatigue despite improvement in hemoglobin he is more short of breath.  His mood is also low.  He has been started by the [facility] yesterday/3/24 and Zoloft but he denies being sad.  Although depression has been diagnosed.  Definitely not suicidal.  He is unable to understand his fatigue.  Wife also affirms the same.  Review of his labs included 07/01/2022 low vitamin D but is not on supplements and abnormal TSH in 2018 which she believes has not been rechecked.  He believes he is lost weight but according to our indicators his weight is stable  His last echocardiogram was in November 2022 And his last high-resolution CT chest was in May 2023   OV 09/25/2022  Subjective:  Patient ID: [name], male , DOB: January 04, 1941 , age 27 y.o. , MRN: [id number] , ADDRESS: [address] [place] [address] PCP Marguarite Arbour, MD Patient Care Team: Marguarite Arbour, MD as PCP - General (Unknown Physician Specialty) Hillis Range, MD (Inactive) as PCP - Electrophysiology (Cardiology) Lennette Bihari, MD as PCP - Cardiology (Cardiology) Quintella Reichert, MD as PCP - Sleep Medicine (Cardiology) Duke, Roe Rutherford, PA as Physician Assistant (Cardiology)  This Provider for this visit: Treatment Team:  Attending Provider: Kalman Shan,  MD    09/25/2022 -   Chief Complaint  Patient presents with   Follow-up    F/up prescription, weight loss     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 Pn med list May 2023  = Started pirfenidone June 2021  - ILD-Pro registry stdy  - last visit 04/09/2021  - Last HRCT May 2021 -. May 2023 without change (indeterminate)  Abnormal Echo - Gr 2 ddx Nov 2022   - ef 60%  - Gr 2 ddx  - RVSP  54  - Mitral and Tricuspid Mod regurg   RHC 05/29/21 - Mild pulmonary hypertension in this patient with interstitial lung disease with elevated RV 54/12; PA pressure 55/18; mean 31 mmHg.  PVR 2.6 WU. PW mean pressure is 17 mmHg, with V wave 24 c/w his echo documented mild - moderate mitral regurgitation.   CKD -  -07/13/2019: Creatinine 1.2 mg percent -05/23/2021 creatinine 1.15 mg percent  - 06/13/21 creat 1.6mg %, GFR 39 - 09/30/21 creat 1.7m GFR 42 - 10/16/21 - creat 1.84mg % - GFR 34 -11/27/2021: Creatinine 1.31 mg percent\\ - 05/30/22 - cret 0.95mg %  Mild COVID early 2023  Admission for blood loss anemia -without identifiable source in January 2024 status post 3 units of blood  -Started iron infusion Sep 24, 2022 with Dr. Orlie Dakin   Unintentional weight loss as below  Systolic heart failure April 2024 -Started on Aldactone by Dr. Nicki Guadalajara Sep 24, 2022   HPI [name] 82 y.o. -returns for follow-up.  I saw him a month ago for his weight loss and dysgeusia.  We had him stop pirfenidone.  He stopped it he is not taking it for the last 1 month.  His wife is here with him and she is independent historian.  They are alarmed of the weight loss there is more weight loss is at least 20 pounds of weight loss in the last 1 month.  Yesterday was started on Aldactone for his new onset heart failure which we diagnosed after his last visit.  He saw Dr. Nicki Guadalajara for that.  He is also got an iron infusion yesterday for his unexplained anemia.  Review of medication shows that he is on Areva which can cause weight loss.  He temporarily stopped it 2 weeks ago but restarted it 2 days ago.  He restarted it because his arms were hurting.  He is also on Jardiance for the last 2 years and this correlates with the time.  his weight loss although he was on pirfenidone at the same time but the fact that he is losing weight despite coming off pirfenidone.  Did simple exercise hypoxemia test.  I made him do a  sit/stand test for ten reps.  He did not desaturate   Last Weight  Most recent update: 09/25/2022  1:03 PM    Weight  71.8 kg (158 lb 6.4 oz)                  SYMPTOM SCALE - ILD 10/17/2019 Started pirfenidone June 2021 06/29/2020  04/09/2021 193# 10/01/2021 Esbriet lower dose since may 2023  186# 05/06/2022 Esbiret lowe rdose due to CKD  173# 08/28/2022 177# Esbiret low dose 09/25/2022 158#  O2 use ra ra ra ra ra ra   Shortness of Breath 0 -> 5 scale with 5 being worst (score 6 If unable to do)  At rest 0 0 0 0 1 2   Simple tasks - showers, clothes change, eating, shaving 1 1 0 0 0 2   Household (dishes, doing bed, laundry) 1 0 0 0 0 2   Shopping 0 0 1 0 1 2   Walking level at own pace 2 0 4 0 2 3   Walking up Stairs 3 3 4 3 3 4    Total (30-36) Dyspnea Score 7 4 9 3 7 15    How bad is your cough? x 1  0 2 0   How bad is your fatigue 4 2 2 4 2 5    How bad is nausea 0 0 0 0 0 2   How bad is vomiting?  0 0 0 0 0 0   How bad is diarrhea? 1 0 0 0.5 0 5   How bad is anxiety? 1 1 1 2 1 2    How bad is depression 1 1  1 1 1 4          Simple office walk 185 feet x  3 laps goal with forehead probe 11/03/2017  06/01/2019  06/29/2020  04/09/2021  10/01/2021  09/25/2022   O2 used Room air Room air  ra ra ra  Number laps completed 3 3  3     Comments about pace Fast very  99% and 66    Sit stand 10 tmes  Resting Pulse Ox/HR 100% and 70/min 99% and 66/min 97% and 71 100% and Pulse 77 100% and  HR 67 98% and HR 53  Final Pulse Ox/HR 98% and 121/min 98% and 120 min 98% and 121/min 99% and HR 114 97% and HR 112 98% and HR 110  Desaturated </= 88% no    no   Desaturated <= 3% points no    Yes 3 pnts   Got Tachycardic >/= 90/min yes    yes   Symptoms at end of test none Mild dyspea No dyspnea Dyspnea at end Noe copmpaint Mild ydpnes  Miscellaneous comments veryu fast Mod pace fast Fast pace moderate          Latest Ref Rng & Units 08/25/2022   10:58 AM 10/01/2021   10:03 AM  04/17/2020    9:34 AM 10/13/2019    3:53 PM 07/21/2019    3:53 PM 01/17/2019   12:39 PM 11/03/2017    2:10 PM  PFT Results  FVC-Pre L 2.80  3.27  3.05  3.08  2.92  3.41  3.59   FVC-Predicted Pre % 81  93  86  86  81  94  98   Pre FEV1/FVC % % 84  84  81  81  82  82  83   FEV1-Pre L 2.34  2.75  2.46  2.49  2.40  2.78  2.98   FEV1-Predicted Pre % 97  112  98  98  94  107  114   DLCO uncorrected ml/min/mmHg 20.12  18.84  19.38  19.58  20.08  21.58  20.97   DLCO UNC% % 91  85  87  87  89  96  74   DLCO corrected ml/min/mmHg 22.73  18.63  19.38  19.58  20.08     DLCO COR %Predicted % 103  84  87  87  89     DLVA Predicted % 141  92  105  101  112  104  89     HRCT 09/04/22  IMPRESSION: 1. Spectrum of findings  suggestive of new mild congestive heart failure superimposed on chronic fibrotic interstitial lung disease. New mild cardiomegaly. Small dependent right pleural effusion. Worsened generalized pulmonary septal thickening compatible with cardiogenic pulmonary edema. 2. Assessment of progression of the underlying chronic interstitial lung disease is confounded by the above findings. Given mild air trapping, fibrotic NSIP remains the favored diagnosis, with UIP not excluded. Findings are indeterminate for UIP per consensus guidelines: Diagnosis of Idiopathic Pulmonary Fibrosis: An Official ATS/ERS/JRS/ALAT Clinical Practice Guideline. Am Rosezetta Schlatter Crit Care Med Vol 198, Iss 5, [phone], Jan 24 2017. 3. Dilated main pulmonary artery, suggesting pulmonary arterial hypertension. 4. Three-vessel coronary atherosclerosis. 5. Nonobstructing left nephrolithiasis. 6.  Aortic Atherosclerosis (ICD10-I70.0).     Electronically Signed   By: Delbert Phenix M.D.   On: 09/08/2022 08:24   ECHO 09/04/22   IMPRESSIONS     1. Left ventricular ejection fraction, by estimation, is 45 to 50%. Left  ventricular ejection fraction by 2D MOD biplane is 43.7 %. The left  ventricle has mildly decreased  function. The left ventricle demonstrates  global hypokinesis. Left ventricular  diastolic parameters are consistent with Grade II diastolic dysfunction  (pseudonormalization). Elevated left ventricular end-diastolic pressure.   2. Right ventricular systolic function is moderately reduced. The right  ventricular size is normal. There is severely elevated pulmonary artery  systolic pressure.   3. Left atrial size was severely dilated.   4. Right atrial size was mildly dilated.   5. The mitral valve is normal in structure. Mild mitral valve  regurgitation. No evidence of mitral stenosis.   6. Tricuspid valve regurgitation is moderate.   7. The aortic valve is tricuspid. Aortic valve regurgitation is mild. No  aortic stenosis is present. Aortic regurgitation PHT measures 592 msec.   8. The inferior vena cava is normal in size with greater than 50%  respiratory variability, suggesting right atrial pressure of 3 mmHg.      Latest Reference Range & Units 12/26/15 08:28 09/10/16 05:17 09/11/16 05:29 09/13/16 05:00 09/15/16 04:03 04/02/17 12:04 08/11/17 11:18 08/12/17 03:41 11/10/18 08:10 08/02/19 18:36 07/25/20 15:43 05/23/21 11:51 05/29/21 08:48 05/23/22 12:16 05/23/22 23:58 05/24/22 06:31 05/24/22 18:09 05/25/22 07:03 05/26/22 03:22 05/26/22 08:34 05/26/22 18:32 05/27/22 03:34 05/27/22 17:30 05/28/22 02:24 05/28/22 15:55 05/29/22 03:02 05/30/22 04:44 07/21/22 14:26 08/28/22 16:31 09/04/22 11:47  Hemoglobin 13.0 - 17.0 g/dL 54.0 98.1 (L) 19.1 (L) 11.7 (L) 11.4 (L) 13.7 13.4 11.5 (L) 13.0 13.4 14.3 12.4 (L) 10.9 (L) 11.2 (L) 6.0 (LL) 7.9 (L) 7.4 (L) 8.5 (L) 8.7 (L) 7.1 (L) 7.1 (L) 8.1 (L) 7.3 (L) 8.4 (L) 7.0 (L) 7.6 (L) 7.3 (L) 7.8 (L) 11.1 (L) 11.7 (L) 11.7 (L)  (LL): Data is critically low (L): Data is abnormally low   has a past medical history of Anemia, Anxiety (self recent), Appendicitis, Atrial fibrillation (HCC) (12/12/2013), Bone cancer (HCC) (father  49), BPH (benign prostatic  hypertrophy), CAD (coronary artery disease) (12/2015), Chest pain (12/03/2015), CHF (congestive heart failure) (HCC), Complete heart block (HCC), Coronary artery disease, Coronary artery disease involving native coronary artery of native heart with unstable angina pectoris (HCC) (08/11/2017), D-dimer, elevated (04/03/2017), Depression (self recent), Drug reaction (07/11/2021), GERD (gastroesophageal reflux disease), History of blood transfusion (1968), History of kidney stones, History of SCC (squamous cell carcinoma) of skin (07/24/2020), Hyperglycemia (11/05/2013), Hyperlipidemia (11/05/2013), Hypertension, Hypothyroidism, Hypothyroidism, unspecified (11/05/2013), Inflammatory arthritis (11/05/2013), Onychomycosis (12/20/2015), OSA (obstructive sleep apnea) (10/26/2017), OSA on CPAP, Pacemaker-St.Jude (03/10/2012), Pancreatic cancer (HCC) (mother at age 61), 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   reports that he quit smoking about 50 years ago. His smoking use included cigarettes. He has a 5.00 pack-year smoking history. He quit smokeless tobacco use about 49 years ago.  His smokeless tobacco use included chew.  Past Surgical History:  Procedure Laterality Date   BACK SURGERY     BALLOON DILATION N/A 05/24/2022   Procedure: BALLOON DILATION;  Surgeon: Imogene Burn, MD;  Location: [facility] ENDOSCOPY;  Service: Gastroenterology;  Laterality: N/A;   CARDIAC CATHETERIZATION N/A 12/28/2015   Procedure: Left Heart Cath and Coronary Angiography;  Surgeon: Lyn Records, MD;  Location: [facility] INVASIVE CV LAB;  Service: Cardiovascular;  Laterality: N/A;   CATARACT EXTRACTION W/ INTRAOCULAR LENS  IMPLANT, BILATERAL Bilateral    COLONOSCOPY WITH PROPOFOL N/A 05/25/2022   Procedure: COLONOSCOPY WITH PROPOFOL;  Surgeon: Imogene Burn, MD;  Location: [facility] ENDOSCOPY;  Service: Gastroenterology;  Laterality: N/A;   CORONARY ANGIOPLASTY WITH  STENT PLACEMENT  08/11/2017   "2 stents"   CORONARY STENT INTERVENTION N/A 08/11/2017   Procedure: CORONARY STENT INTERVENTION;  Surgeon: Lennette Bihari, MD;  Location: [facility] INVASIVE CV LAB;  Service: Cardiovascular;  Laterality: N/A;   CYSTOSCOPY W/ STONE MANIPULATION     ESOPHAGOGASTRODUODENOSCOPY (EGD) WITH PROPOFOL N/A 05/24/2022   Procedure: ESOPHAGOGASTRODUODENOSCOPY (EGD) WITH PROPOFOL;  Surgeon: Imogene Burn, MD;  Location: [facility] ENDOSCOPY;  Service: Gastroenterology;  Laterality: N/A;   GIVENS CAPSULE STUDY N/A 05/25/2022   Procedure: GIVENS CAPSULE STUDY;  Surgeon: Imogene Burn, MD;  Location: [facility] ENDOSCOPY;  Service: Gastroenterology;  Laterality: N/A;   INGUINAL HERNIA REPAIR Left    INSERT / REPLACE / REMOVE PACEMAKER  12/09/2011   SJM Accent DR RF implanted by DR Allred for complete heart block and syncope   JOINT REPLACEMENT     LAPAROSCOPIC CHOLECYSTECTOMY     LEAD REVISION/REPAIR N/A 08/09/2020   Procedure: LEAD REVISION/REPAIR;  Surgeon: Hillis Range, MD;  Location: [facility] INVASIVE CV LAB;  Service: Cardiovascular;  Laterality: N/A;   LITHOTRIPSY     LUMBAR DISC SURGERY     "removed arthritis and spurs"   PERMANENT PACEMAKER INSERTION N/A 12/09/2011   Procedure: PERMANENT PACEMAKER INSERTION;  Surgeon: Hillis Range, MD;  Location: [facility] CATH LAB;  Service: Cardiovascular;  Laterality: N/A;   PPM GENERATOR CHANGEOUT N/A 08/09/2020   Procedure: PPM GENERATOR CHANGEOUT;  Surgeon: Hillis Range, MD;  Location: [facility] INVASIVE CV LAB;  Service: Cardiovascular;  Laterality: N/A;   PROSTATE SURGERY     REPLACEMENT TOTAL KNEE Right    RIGHT HEART CATH N/A 05/29/2021   Procedure: RIGHT HEART CATH;  Surgeon: Lennette Bihari, MD;  Location: [facility] INVASIVE CV LAB;  Service: Cardiovascular;  Laterality: N/A;   RIGHT/LEFT HEART CATH AND CORONARY ANGIOGRAPHY N/A 08/11/2017   Procedure: RIGHT/LEFT HEART CATH AND CORONARY ANGIOGRAPHY;  Surgeon: Laurey Morale, MD;  Location: [facility] INVASIVE CV LAB;   Service: Cardiovascular;  Laterality: N/A;   TRANSURETHRAL RESECTION OF PROSTATE  2017/2018    Allergies  Allergen Reactions   Ace Inhibitors Swelling    Tongue swelling, angioedema   Lisinopril     Other reaction(s): Lip swelling, O/E - lip swelling   Celecoxib Rash    Skin rash     Immunization History  Administered Date(s) Administered   Fluad Quad(high Dose 65+) 02/11/2020   Influenza Inj Mdck Quad Pf 03/25/2021   Influenza, High Dose Seasonal PF 03/26/2017, 02/11/2022   Influenza-Unspecified 05/03/2015, 02/13/2016, 03/26/2016,  03/19/2017, 03/03/2018, 03/17/2019, 04/09/2020, 04/18/2021   PFIZER Comirnaty(Gray Top)Covid-19 Tri-Sucrose Vaccine 06/11/2019, 07/02/2019, 07/04/2019, 07/18/2019, 03/01/2020, 11/15/2020   PFIZER(Purple Top)SARS-COV-2 Vaccination 06/11/2019, 07/04/2019, 07/18/2019, 03/01/2020   Pneumococcal Conjugate-13 02/11/2020   Pneumococcal Polysaccharide-23 12/10/2011, 01/30/2018   Pneumococcal-Unspecified 03/26/2016, 02/04/2018   Tdap 01/17/2016, 02/04/2018   Zoster Recombinat (Shingrix) 08/14/2020, 11/29/2020    Family History  Problem Relation Age of Onset   Pancreatic cancer Mother    Cancer Mother    Bone cancer Father    Cancer Father    Alzheimer's disease Sister    Heart attack Paternal Uncle    Arthritis Maternal Grandfather    Alzheimer's disease Paternal Grandmother    Lung cancer Paternal Grandfather      Current Outpatient Medications:    acetaminophen (TYLENOL) 500 MG tablet, Take 500 mg by mouth every 6 (six) hours as needed for moderate pain., Disp: , Rfl:    allopurinol (ZYLOPRIM) 300 MG tablet, Take 300 mg by mouth daily., Disp: , Rfl:    apixaban (ELIQUIS) 2.5 MG TABS tablet, Take by mouth 2 (two) times daily., Disp: , Rfl:    BERBERINE CHLORIDE PO, Take 1,500 mg by mouth., Disp: , Rfl:    Coenzyme Q10 (CO Q 10 PO), Take by mouth., Disp: , Rfl:    colchicine 0.6 MG tablet, Take 0.6 mg by mouth daily., Disp: , Rfl:    JARDIANCE 10  MG TABS tablet, Take 10 mg by mouth daily., Disp: , Rfl:    leflunomide (ARAVA) 10 MG tablet, Take 1 tablet by mouth daily., Disp: , Rfl:    nebivolol (BYSTOLIC) 5 MG tablet, Take 5 mg by mouth daily., Disp: , Rfl:    nitroGLYCERIN (NITROSTAT) 0.4 MG SL tablet, Place 1 tablet (0.4 mg total) under the tongue every 5 (five) minutes as needed for chest pain., Disp: 75 tablet, Rfl: 2   NON FORMULARY, 500 mg. Beef liver, Disp: , Rfl:    Omega-3 Fatty Acids (OMEGA-3 FISH OIL PO), Take 1 capsule by mouth daily., Disp: , Rfl:    pravastatin (PRAVACHOL) 20 MG tablet, Take 1 tablet (20 mg total) by mouth every evening., Disp: 90 tablet, Rfl: 3   spironolactone (ALDACTONE) 25 MG tablet, Take 1/2 tablet ( 12.5 mg ) daily, Disp: 45 tablet, Rfl: 3   telmisartan (MICARDIS) 80 MG tablet, Take 1 tablet (80 mg total) by mouth daily., Disp: 90 tablet, Rfl: 3   thyroid (ARMOUR) 90 MG tablet, Take 90 mg by mouth every morning. , Disp: , Rfl:    triamcinolone ointment (KENALOG) 0.5 %, Apply 1 Application topically 2 (two) times daily., Disp: 30 g, Rfl: 0   Trospium Chloride 60 MG CP24, TAKE 1 CAPSULE BY MOUTH DAILY (Patient taking differently: Take 1 capsule by mouth daily.), Disp: 30 capsule, Rfl: 2   TURMERIC PO, Take 720 mg by mouth., Disp: , Rfl:    VTAMA 1 % CREA, APPLY 1 APPLICATION TOPICALLY DAILY, Disp: 60 g, Rfl: 5  Current Facility-Administered Medications:    sodium chloride flush (NS) 0.9 % injection 3 mL, 3 mL, Intravenous, Q12H, Lennette Bihari, MD  Facility-Administered Medications Ordered in Other Visits:    ondansetron (ZOFRAN) 4 mg in sodium chloride 0.9 % 50 mL IVPB, 4 mg, Intravenous, Q6H PRN, Coletta Memos, MD      Objective:   Vitals:   09/25/22 1302  BP: 120/60  Pulse: 70  SpO2: 97%  Weight: 158 lb 6.4 oz (71.8 kg)  Height: 5\' 7"  (1.702 m)  Estimated body mass index is 24.81 kg/m as calculated from the following:   Height as of this encounter: 5\' 7"  (1.702 m).   Weight as of  this encounter: 158 lb 6.4 oz (71.8 kg).  @WEIGHTCHANGE @  [employer]   09/25/22 1302  Weight: 158 lb 6.4 oz (71.8 kg)     Physical Exam  General: No distress. Looks t hin Neuro: Alert and Oriented x 3. GCS 15. Speech normal Psych: Pleasant Resp:  Barrel Chest - no.  Wheeze - no, Crackles - some base, No overt respiratory distress CVS: Normal heart sounds. Murmurs - no Ext: Stigmata of Connective Tissue Disease - no HEENT: Normal upper airway. PEERL +. No post nasal drip        Assessment:       ICD-10-CM   1. Dyspnea on exertion  R06.09     2. ILD (interstitial lung disease) (HCC)  J84.9     3. Dysgeusia  R43.2     4. Unintentional weight loss  R63.4          Plan:     Patient Instructions    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 Hx of Agent Orange Exposure   - Pulmonary Fibrosis  overall progressive. REcently stable.  Hx of covid March 2023 - did not affect your fibrosis.  Stable in the last 1 month despite stopping pirfenidone due to weight loss and other side effects  Plan - -Continue monitor without pirfenidone because of the weight loss -  Dysgeusia Unintentional weight loss - despite stopping esbriet early April 2024  -Another 20 pound weight loss despite stopping pirfenidone.  Reasons could be your iron deficiency anemia or Jardiance or Areva  -Heart failure and pulmonary fibrosis can cause weight loss but only when it is more advanced  -Noted that taste is some better since stopping pirfenidone  Plan - Continue to hold pirfenidone - Talk to your doctors about weight loss risk with Jardiance and with Areva  Fatigue t -It is severe despite talking pirfenidone.  I think it is multifactorial and related to your medications and also new onset heart failure and iron deficiency anemia. Plan - According to primary care physician   Followup -4 to 6  weeks return to see Dr. Marchelle Gearing or nurse practitioner    SIGNATURE    Dr. Kalman Shan, M.D., F.C.C.P,  Pulmonary and Critical Care Medicine Staff Physician, [facility] Director - Interstitial Lung Disease  Program  Pulmonary Fibrosis [facility] at [facility] [place], [place], [address]  Pager: [phone], If no answer or between  15:00h - 7:00h: call [phone] Telephone: [phone]  1:31 PM 09/25/2022

## 2022-09-24 NOTE — Progress Notes (Signed)
Cardiology Office Note    Date:  09/27/2022   ID:  Cameron [name], DOB 1940/12/08, MRN [id number]  PCP:  Marguarite Arbour, MD  Cardiologist:  Nicki Guadalajara, MD  EP: [name]  6 month follow-up evaluation  History of Present Illness:  Cameron [name] is a 82 y.o. 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  He had been evaluated at [facility] from the advanced heart failure team, Dr. Mayford Knife with reference to obstructive sleep apnea, and [name] for his EP and pacemaker.  He also has seen Dr. Dorisann Frames because of increasing shortness of breath.  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  He was also found to have aortic atherosclerosis with coronary calcification and enlarged pulmonary arteries with a peak PA pressure at 55 mm.  He was referred to Dr. Shirlee Latch who performed a diagnostic cardiac catheterization on August 11, 2017.  He was found to have normal left and right heart filling pressures.  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  He was found to have severe disease in his proximal circumflex, proximal OM1 proximal diagonal 3 and was felt that his dyspnea was the result of combination of parenchymal lung disease and coronary artery disease.  I was asked to perform invention to this bifurcation stenosis of his circumflex and OM1 vessel.  His procedure was difficult but successful with an excellent result in a 2.5 x 15 mm Resolute stent was inserted into the OM 1 vessel at the ostium and a 3.0 x 18 mm Resolute DES stent was inserted from the proximal circumflex into the mid AV groove circumflex beyond the OM1  vessel.  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  That was the only time that I had seen the patient.  Apparently, the patient was recently seen by [name] who recommended he see me for his general cardiology care.  He presents for evaluation.    Since his intervention, he denies any anginal type symptoms.  He had noticed a rare episode of dizziness.  He has obstructive sleep apnea and has been on CPAP therapy for 2 months and was followed by Dr. Mayford Knife.   He underwent carotid ultrasound imaging in June 2019 which showed minimal to moderate bilateral atherosclerotic plaque.  A head CT without contrast did not show any evidence of acute intracranial abnormality and showed mild atrophy and chronic small vessel white matter ischemic changes.  He has been evaluated by [name] who follows his pacemaker.   He was evaluated by Azalee Course, PA with several occasions and was last seen in January 2021.  That time, he he had mild shortness of breath symptoms only when he performs strenuous activity.  He did not have any anginal type chest pain.  He was having lower extremity mild edema and he was given a prescription for Lasix to take on an as-needed basis.  I evaluated him in a telemedicine visit on July 05, 2019.  He has continued to be followed by Dr. Aram Beecham for his primary care.  He is scheduled to undergo a breathing test with Dr. Marchelle Gearing.  He has noticed his blood pressure to be elevated  as well as his heart rate in the upper 90s.  At that time, lisinopril was titrated to 20 mg and Bystolic was increased. He denies any anginal symptoms.  He has continued to use CPAP.  A download was obtained from April 02, 2019 through June 30, 2019 and he is meeting compliance with 97% of usage days.  Average CPAP use is 7 hours and 6 minutes.  At his set pressure of 7 cm, AHI is excellent at 1.3.   He was seen by Azalee Course, PA on September 05, 2019  his blood pressure was improved.  He had  presented to the emergency room in March 2021 with elevated potassium.   I saw him in September 2021.  He was not having any anginal symptomatology.  Blood pressure continued to be elevated despite taking lisinopril 20 mg and Bystolic 10 mg and I recommended further titration of lisinopril to 30 mg.  He has continued to to be evaluated by Dr. Marchelle Gearing for his interstitial lung disease.  He  has noticed further blood pressure elevation despite taking Bystolic 10 mg, lisinopril 20 mg.  He has a prescription for furosemide but rarely takes this.  He continues to be on DAPT with aspirin/Plavix.  He is on Esbriet for his interstitial lung disease.  He is diabetic on glimepiride in addition to Metformin.  He was recently started on a prednisone taper.  He continues to use CPAP with excellent compliance.    I saw him on  August 08 2020 and since his prior evaluation he was evaluated by [name] and is in need for pacemaker replacement as well as removal and replacement of his atrial lead.  He has continued to walk at least 1 mile 3 days/week.  He has continued to use CPAP and a download was obtained from February 14 through August 07, 2020 which continues to show excellent compliance with average use of 6 hours and 7 minutes per night.  At 7 cm water pressure AHI is excellent at 1.3/h.  He denies any anginal symptoms.  His blood pressure improved with his medication adjustments from his last office visit.    He was seen by Edd Fabian, NP in August 2022 and he was started on diltiazem 120 mg daily for increased palpitations.  He was recently evaluated by [name] on April 12, 2021 and it was felt at that time that the patient was in atrial flutter for approximately 2 weeks duration.  During that visit he was able to be paced to terminate his atrial flutter with an NIPS and with atrial pacing at 180 ms his atrial flutter terminated.  He continues to see Dr. Marchelle Gearing for his interstitial lung disease.  His  pulmonary function studies have indicated progression compared to 2 years previously.  He was recently felt by Dr. Marchelle Gearing that he may benefit from repeat right heart catheterization to further evaluate his pulmonary hypertension.  He has continued to be on Esbriet.    I saw him on May 09, 2021 at which time he denied any anginal symptoms.  His heart rate has been stable.  He has been using CPAP therapy.  He underwent a follow-up echo Doppler study April 08, 2021 which showed EF at 60 to 65% and calculated at 61% by 3D volume.  There was grade 2 diastolic dysfunction and mild LVH.  There was moderate elevation of pulmonary artery systolic pressure 54.8 mmHg.  There was moderate biatrial enlargement, mild to moderate mitral regurgitation and  moderate mitral annular calcification.  There was aortic sclerosis without stenosis.  A small PFO cannot be completely excluded.  He presents for evaluation.  I performed his right heart catheterization on May 29, 2021 which revealed mild pulmonary hypertension in this patient with interstitial lung disease with elevated RV at 54/12, PA pressure 55/18, mean PA pressure 31 mmHg, and PVR 2.6 WU.  His PW mean pressure was 17 mm with a V wave of 24 consistent with his documented mild to moderate mitral regurgitation.  He has been evaluated by EP and saw Maxine Glenn, PA-C for [name].  He had normal pacemaker function.  He was maintaining sinus rhythm with atrial fibrillation burden less than 1% by device and he was maintained on Xarelto.  I last saw him on April 01, 2022 at which time he felt well but admitted to having occasional leg swelling.  He denied chest pain or shortness of breath.  He has been exercising at least 3 days/week.  He continues to be on Esbriet for his interstitial lung disease.  He is on telmisartan 80 mg, nebivolol 10 mg daily, and diltiazem 120 mg daily for hypertension.  He is unaware of recurrent AF.  He continues to be on  glimepiride for diabetes mellitus.  He is on Armour Thyroid 90 mg for hypothyroidism.  He continues to take pravastatin 20 mg for hyperlipidemia.    He was evaluated by Azalee Course, PA on July 11, 2022 following a recent hospitalization where he was admitted on May 23, 2022 with weakness and lightheaded this.  He was found to have hemoglobin of 6.0 on admission and repeat required 2 units of packed red blood cell transfusion.  He was evaluated by GI and upper endoscopy showed benign-appearing esophageal stenosis, normal stomach, and duodenal stenosis.  Colonoscopy revealed diverticulosis, nonbleeding internal hemorrhoids.  Capsule endoscopy was negative.  CT enterography  was negative for acute findings.  He was transition from Xarelto to Eliquis due to lower GI bleed profile.  During his evaluation with Wynema Birch, it was decided to restart Eliquis 5 mg twice a day and discontinue aspirin. Pravastatin 20 mg.  His heart rate was stable at 60 bpm on pacemaker.  He underwent an echo Doppler study in September 17, 2022.  EF was 45 to 50% and there was grade 2 diastolic dysfunction.  RV systolic function was moderately reduced.  There was severely elevated PA pressure at 61 mmHg.  There was severe left atrial dilatation and mild right atrial dilatation.  Presently, he denies chest pain.  He has been trying to walk 1 mile 3 days/week and he also does some light resistance training.  He does admit to some occasional swelling of his right leg.  He apparently has been off Esbriet for 2 months.  He has lost 30 pounds over the past year.  He has issues with metallic taste in his mouth.  He is followed by Dr. Orlie Dakin of hematology and apparently saw him today.  He has been undergoing iron transfusion therapy.  Recent BNP on April 16 was 482.  T4 was 0.72.  He is on Eliquis 2.5 mg twice a day, Jardiance 10 mg, nebivolol 5 mg daily, telmisartan 80 mg, Armour Thyroid 90 mg, and takes pravastatin 20 mg daily.  He presents for  evaluation.   Past Medical History:  Diagnosis Date   Anemia    Anxiety self recent   Appendicitis    Atrial fibrillation (HCC) 12/12/2013   Bone cancer [facility]) father  1975   BPH (benign prostatic hypertrophy)    CAD (coronary artery disease) 12/2015   Cath by Dr Katrinka Blazing reveals distal and small vessel CAD.  Medical therapy advised.   Chest pain 12/03/2015   CHF (congestive heart failure) (HCC)    Complete heart block (HCC)    s/p PPM   Coronary artery disease    Coronary artery disease involving native coronary artery of native heart with unstable angina pectoris (HCC) 08/11/2017   D-dimer, elevated 04/03/2017   Depression self recent   Drug reaction 07/11/2021   GERD (gastroesophageal reflux disease)    History of blood transfusion 1968   "probably; related to getting wounded in [place]"   History of kidney stones    History of SCC (squamous cell carcinoma) of skin 07/24/2020   right upper arm/excision   Hyperglycemia 11/05/2013   Hyperlipidemia 11/05/2013   Hypertension    Hypothyroidism    Hypothyroidism, unspecified 11/05/2013   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  AHI was 20/h while supine.  Oxygen saturations dropped to 87%.  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   Pancreatic cancer [facility]) mother at age 36   Presence of permanent cardiac pacemaker 12/09/2011   Rheumatoid arthritis (HCC)    "hands" (08/11/2017)   Type II diabetes mellitus (HCC)     Past Surgical History:  Procedure Laterality Date   BACK SURGERY     BALLOON DILATION N/A 05/24/2022   Procedure: BALLOON DILATION;  Surgeon: Imogene Burn, MD;  Location[facility] ENDOSCOPY;  Service: Gastroenterology;  Laterality: N/A;   CARDIAC CATHETERIZATION N/A 12/28/2015   Procedure: Left Heart Cath and Coronary Angiography;  Surgeon: Lyn Records, MD;  Location: [facility]  INVASIVE CV LAB;  Service: Cardiovascular;  Laterality: N/A;   CATARACT EXTRACTION W/ INTRAOCULAR LENS  IMPLANT, BILATERAL Bilateral    COLONOSCOPY WITH PROPOFOL N/A 05/25/2022   Procedure: COLONOSCOPY WITH PROPOFOL;  Surgeon: Imogene Burn, MD;  Location: [facility] ENDOSCOPY;  Service: Gastroenterology;  Laterality: N/A;   CORONARY ANGIOPLASTY WITH STENT PLACEMENT  08/11/2017   "2 stents"   CORONARY STENT INTERVENTION N/A 08/11/2017   Procedure: CORONARY STENT INTERVENTION;  Surgeon: Lennette Bihari, MD;  Location: [facility] INVASIVE CV LAB;  Service: Cardiovascular;  Laterality: N/A;   CYSTOSCOPY W/ STONE MANIPULATION     ESOPHAGOGASTRODUODENOSCOPY (EGD) WITH PROPOFOL N/A 05/24/2022   Procedure: ESOPHAGOGASTRODUODENOSCOPY (EGD) WITH PROPOFOL;  Surgeon: Imogene Burn, MD;  Location: [facility] ENDOSCOPY;  Service: Gastroenterology;  Laterality: N/A;   GIVENS CAPSULE STUDY N/A 05/25/2022   Procedure: GIVENS CAPSULE STUDY;  Surgeon: Imogene Burn, MD;  Location: [facility] ENDOSCOPY;  Service: Gastroenterology;  Laterality: N/A;   INGUINAL HERNIA REPAIR Left    INSERT / REPLACE / REMOVE PACEMAKER  12/09/2011   SJM Accent DR RF implanted by DR Allred for complete heart block and syncope   JOINT REPLACEMENT     LAPAROSCOPIC CHOLECYSTECTOMY     LEAD REVISION/REPAIR N/A 08/09/2020   Procedure: LEAD REVISION/REPAIR;  Surgeon: Hillis Range, MD;  Location: [facility] INVASIVE CV LAB;  Service: Cardiovascular;  Laterality: N/A;   LITHOTRIPSY     LUMBAR DISC SURGERY     "removed arthritis and spurs"   PERMANENT PACEMAKER INSERTION N/A 12/09/2011   Procedure: PERMANENT PACEMAKER INSERTION;  Surgeon: Hillis Range, MD;  Location: [facility] CATH LAB;  Service:  Cardiovascular;  Laterality: N/A;   PPM GENERATOR CHANGEOUT N/A 08/09/2020   Procedure: PPM GENERATOR CHANGEOUT;  Surgeon: Hillis Range, MD;  Location: [facility] INVASIVE CV LAB;  Service: Cardiovascular;  Laterality: N/A;   PROSTATE SURGERY     REPLACEMENT TOTAL KNEE Right    RIGHT HEART  CATH N/A 05/29/2021   Procedure: RIGHT HEART CATH;  Surgeon: Lennette Bihari, MD;  Location: [facility] INVASIVE CV LAB;  Service: Cardiovascular;  Laterality: N/A;   RIGHT/LEFT HEART CATH AND CORONARY ANGIOGRAPHY N/A 08/11/2017   Procedure: RIGHT/LEFT HEART CATH AND CORONARY ANGIOGRAPHY;  Surgeon: Laurey Morale, MD;  Location: [facility] INVASIVE CV LAB;  Service: Cardiovascular;  Laterality: N/A;   TRANSURETHRAL RESECTION OF PROSTATE  2017/2018    Current Medications: Outpatient Medications Prior to Visit  Medication Sig Dispense Refill   acetaminophen (TYLENOL) 500 MG tablet Take 500 mg by mouth every 6 (six) hours as needed for moderate pain.     aspirin 81 MG EC tablet Take 81 mg by mouth daily.     [employer]S TRIPLE MAGNESIUM COMPLEX PO Take by mouth.     diltiazem (CARDIZEM CD) 120 MG 24 hr capsule Take 1 capsule (120 mg total) by mouth daily. 90 capsule 3   diltiazem (TIAZAC) 120 MG 24 hr capsule Take 120 mg by mouth.     ESBRIET 267 MG TABS TAKE 3 TABLETS THREE TIMES A DAY WITH MEALS (MAINTENANCE DOSE, START AFTER COMPLETED STARTER DOSE) 270 tablet 11   gabapentin (NEURONTIN) 300 MG capsule Take 300 mg by mouth at bedtime.     glimepiride (AMARYL) 2 MG tablet Take 2 mg by mouth every morning.     leflunomide (ARAVA) 10 MG tablet Take 10 mg by mouth daily.     metFORMIN (GLUCOPHAGE) 500 MG tablet Take 500 mg by mouth daily. 2 tablets daily     nebivolol (BYSTOLIC) 5 MG tablet Take 5 mg by mouth daily.     nitroGLYCERIN (NITROSTAT) 0.3 MG SL tablet Place under the tongue.     Omega-3 Fatty Acids (OMEGA-3 FISH OIL PO) Take 1 capsule by mouth daily.     pravastatin (PRAVACHOL) 20 MG tablet Take 1 tablet (20 mg total) by mouth every evening. *NEEDS OFFICE VISIT FOR FURTHER REFILLS* 90 tablet 0   telmisartan (MICARDIS) 80 MG tablet Take 80 mg by mouth daily.     thyroid (ARMOUR) 90 MG tablet Take 90 mg by mouth every morning.      Trospium Chloride 60 MG CP24 TAKE 1 CAPSULE BY MOUTH DAILY 30 capsule 2    TURMERIC PO Take 1 capsule by mouth daily.     XARELTO 20 MG TABS tablet TAKE ONE TABLET BY MOUTH EVERY DAY WITH SUPPERDISCONTINUE CLOPIDROGEL 30 tablet 3   Facility-Administered Medications Prior to Visit  Medication Dose Route Frequency Provider Last Rate Last Admin   ondansetron (ZOFRAN) 4 mg in sodium chloride 0.9 % 50 mL IVPB  4 mg Intravenous Q6H PRN Coletta Memos, MD         Allergies:   Ace inhibitors, Lisinopril, and Celecoxib   Social History   Socioeconomic History   Marital status: Married    Spouse name: Not on file   Number of children: Not on file   Years of education: Not on file   Highest education level: Not on file  Occupational History   Not on file  Tobacco Use   Smoking status: Former    Packs/day: 1.00    Years: 5.00  Additional pack years: 0.00    Total pack years: 5.00    Types: Cigarettes    Quit date: 07/10/1972    Years since quitting: 50.2   Smokeless tobacco: Former    Types: Chew    Quit date: 1975  Vaping Use   Vaping Use: Never used  Substance and Sexual Activity   Alcohol use: Not Currently    Alcohol/week: 1.0 standard drink of alcohol    Types: 1 Glasses of wine per week   Drug use: No   Sexual activity: Not Currently  Other Topics Concern   Not on file  Social History Narrative   Not on file   Social Determinants of Health   Financial Resource Strain: Not on file  Food Insecurity: No Food Insecurity (09/04/2022)   Hunger Vital Sign    Worried About Running Out of Food in the Last Year: Never true    Ran Out of Food in the Last Year: Never true  Transportation Needs: No Transportation Needs (09/04/2022)   PRAPARE - Administrator, Civil Service (Medical): No    Lack of Transportation (Non-Medical): No  Physical Activity: Not on file  Stress: Not on file  Social Connections: Not on file     Family History:  The patient's family history includes Alzheimer's disease in his paternal grandmother and sister;  Arthritis in his maternal grandfather; Bone cancer in his father; Cancer in his father and mother; Heart attack in his paternal uncle; Lung cancer in his paternal grandfather; Pancreatic cancer in his mother.   ROS General: Negative; No fevers, chills, or night sweats;  HEENT: Negative; No changes in vision or hearing, sinus congestion, difficulty swallowing Pulmonary: Interstitial lung disease, reported exposure to agent orange Cardiovascular: See HPI GI: Negative; No nausea, vomiting, diarrhea, or abdominal pain GU: BPH followed by [facility] urology Musculoskeletal: Negative; no myalgias, joint pain, or weakness Hematologic/Oncology: Undergoing iron infusions with Dr. Orlie Dakin Endocrine: Negative; no heat/cold intolerance; no diabetes Neuro: Negative; no changes in balance, headaches Skin: Negative; No rashes or skin lesions Psychiatric: Negative; No behavioral problems, depression Sleep: OSA on CPAP, no breakthrough snoring.  No daytime sleepiness. Other comprehensive 14 point system review is negative.   PHYSICAL EXAM:   VS:  BP 132/60   Pulse 70   Ht 5\' 7"  (1.702 m)   Wt 162 lb (73.5 kg)   SpO2 98%   BMI 25.37 kg/m    Repeat blood pressure by me was initially 130/78 and on repeat 124/74    Wt Readings from Last 3 Encounters:  09/26/22 159 lb 6.4 oz (72.3 kg)  09/25/22 158 lb 6.4 oz (71.8 kg)  09/24/22 162 lb (73.5 kg)    General: Alert, oriented, no distress.  Skin: normal turgor, no rashes, warm and dry HEENT: Normocephalic, atraumatic. Pupils equal round and reactive to light; sclera anicteric; extraocular muscles intact;  Nose without nasal septal hypertrophy Mouth/Parynx benign; Mallinpatti scale 3 Neck: No JVD, no carotid bruits; normal carotid upstroke Lungs: clear to ausculatation and percussion; no wheezing or rales Chest wall: without tenderness to palpitation Heart: PMI not displaced, RRR, s1 s2 normal, 1/6 systolic murmur, no diastolic murmur, no rubs,  gallops, thrills, or heaves Abdomen: soft, nontender; no hepatosplenomehaly, BS+; abdominal aorta nontender and not dilated by palpation. Back: no CVA tenderness Pulses 2+ Musculoskeletal: full range of motion, normal strength, no joint deformities Extremities: Trace right ankle swelling; no clubbing cyanosis or edema, Homan's sign negative  Neurologic: grossly nonfocal; Cranial nerves grossly  wnl Psychologic: Normal mood and affect   Studies/Labs Reviewed:   Sep 24, 2022 ECG (independently read by me): Underlying atrial flutter with V pacing at 70 bpm  April 01, 2022 ECG (independently read by me): AV paced at 67  May 09, 2021 ECG (independently read by me): AV paced at 67  August 08, 2020  ECG (independently read by me): AV paced at 55  September 2021 ECG (independently read by me): AV paced rhythm at 61 bpm  Recent Labs:    Latest Ref Rng & Units 08/28/2022    4:31 PM 05/30/2022    4:44 AM 05/29/2022    3:02 AM  BMP  Glucose 70 - 99 mg/dL 161  096  045   BUN 6 - 23 mg/dL 18  12  15    Creatinine 0.40 - 1.50 mg/dL 4.09  8.11  9.14   Sodium 135 - 145 mEq/L 138  137  136   Potassium 3.5 - 5.1 mEq/L 3.9  3.8  3.8   Chloride 96 - 112 mEq/L 106  108  107   CO2 19 - 32 mEq/L 26  23  22    Calcium 8.4 - 10.5 mg/dL 9.2  8.5  8.4         Latest Ref Rng & Units 08/28/2022    4:31 PM 05/30/2022    4:44 AM 05/24/2022    6:31 AM  Hepatic Function  Total Protein 6.0 - 8.3 g/dL 6.7  5.1  5.0   Albumin 3.5 - 5.2 g/dL 3.8  2.4  2.6   AST 0 - 37 U/L 25  17  15    ALT 0 - 53 U/L 29  17  11    Alk Phosphatase 39 - 117 U/L 193  62  45   Total Bilirubin 0.2 - 1.2 mg/dL 0.5  0.5  0.7   Bilirubin, Direct 0.0 - 0.3 mg/dL 0.2          Latest Ref Rng & Units 09/04/2022   11:47 AM 08/28/2022    4:31 PM 07/21/2022    2:26 PM  CBC  WBC 4.0 - 10.5 K/uL 8.3  9.3  8.2   Hemoglobin 13.0 - 17.0 g/dL 78.2  95.6  21.3   Hematocrit 39.0 - 52.0 % 36.9  36.0  35.1   Platelets 150 - 400 K/uL 252  202.0   233    Lab Results  Component Value Date   MCV 87.0 09/04/2022   MCV 85.9 08/28/2022   MCV 89 07/21/2022   Lab Results  Component Value Date   TSH 3.700 09/04/2022   No results found for: "HGBA1C"   BNP No results found for: "BNP"  ProBNP    Component Value Date/Time   PROBNP 482.0 (H) 09/09/2022 1446     Lipid Panel  No results found for: "CHOL", "TRIG", "HDL", "CHOLHDL", "VLDL", "LDLCALC", "LDLDIRECT", "LABVLDL"   RADIOLOGY: ECHOCARDIOGRAM COMPLETE  Result Date: 09/17/2022    ECHOCARDIOGRAM REPORT   Patient Name:   Cameron [name] Date of Exam: 09/17/2022 Medical Rec #:  [id number]       Height:       67.0 in Accession #:    [id number]      Weight:       170.0 lb Date of Birth:  [birth year]       BSA:          1.887 m Patient Age:    81 years        BP:  143/62 mmHg Patient Gender: M               HR:           77 bpm. Exam Location:  Outpatient Procedure: 2D Echo, Cardiac Doppler, Color Doppler and 3D Echo Indications:    Dyspnea R06.00  History:        Patient has prior history of Echocardiogram examinations, most                 recent 04/08/2021. CHF, CAD, Pacemaker, Arrythmias:Atrial                 Fibrillation, Signs/Symptoms:Chest Pain and Dyspnea; Risk                 Factors:Sleep Apnea, Diabetes, Hypertension, Dyslipidemia and                 GERD.  Sonographer:    Eulah Pont RDCS Referring Phys: [id number] MURALI RAMASWAMY IMPRESSIONS  1. Left ventricular ejection fraction, by estimation, is 45 to 50%. Left ventricular ejection fraction by 2D MOD biplane is 43.7 %. The left ventricle has mildly decreased function. The left ventricle demonstrates global hypokinesis. Left ventricular diastolic parameters are consistent with Grade II diastolic dysfunction (pseudonormalization). Elevated left ventricular end-diastolic pressure.  2. Right ventricular systolic function is moderately reduced. The right ventricular size is normal. There is severely elevated pulmonary artery  systolic pressure.  3. Left atrial size was severely dilated.  4. Right atrial size was mildly dilated.  5. The mitral valve is normal in structure. Mild mitral valve regurgitation. No evidence of mitral stenosis.  6. Tricuspid valve regurgitation is moderate.  7. The aortic valve is tricuspid. Aortic valve regurgitation is mild. No aortic stenosis is present. Aortic regurgitation PHT measures 592 msec.  8. The inferior vena cava is normal in size with greater than 50% respiratory variability, suggesting right atrial pressure of 3 mmHg. FINDINGS  Left Ventricle: Left ventricular ejection fraction, by estimation, is 45 to 50%. Left ventricular ejection fraction by 2D MOD biplane is 43.7 %. The left ventricle has mildly decreased function. The left ventricle demonstrates global hypokinesis. The left ventricular internal cavity size was normal in size. There is no left ventricular hypertrophy. Left ventricular diastolic parameters are consistent with Grade II diastolic dysfunction (pseudonormalization). Elevated left ventricular end-diastolic pressure. Right Ventricle: The right ventricular size is normal. No increase in right ventricular wall thickness. Right ventricular systolic function is moderately reduced. There is severely elevated pulmonary artery systolic pressure. The tricuspid regurgitant velocity is 3.39 m/s, and with an assumed right atrial pressure of 15 mmHg, the estimated right ventricular systolic pressure is 61.0 mmHg. Left Atrium: Left atrial size was severely dilated. Right Atrium: Right atrial size was mildly dilated. Pericardium: There is no evidence of pericardial effusion. Mitral Valve: The mitral valve is normal in structure. Mild mitral annular calcification. Mild mitral valve regurgitation. No evidence of mitral valve stenosis. MV peak gradient, 6.7 mmHg. The mean mitral valve gradient is 2.0 mmHg. Tricuspid Valve: The tricuspid valve is normal in structure. Tricuspid valve regurgitation is  moderate . No evidence of tricuspid stenosis. Aortic Valve: The aortic valve is tricuspid. Aortic valve regurgitation is mild. Aortic regurgitation PHT measures 592 msec. No aortic stenosis is present. Pulmonic Valve: The pulmonic valve was normal in structure. Pulmonic valve regurgitation is mild. No evidence of pulmonic stenosis. Aorta: The aortic root is normal in size and structure. Venous: The inferior vena cava is normal in size with  greater than 50% respiratory variability, suggesting right atrial pressure of 3 mmHg. IAS/Shunts: No atrial level shunt detected by color flow Doppler. Additional Comments: A device lead is visualized.  LEFT VENTRICLE PLAX 2D                        Biplane EF (MOD) LVIDd:         5.10 cm         LV Biplane EF:   Left LVIDs:         3.80 cm                          ventricular LV PW:         0.90 cm                          ejection LV IVS:        0.90 cm                          fraction by LVOT diam:     2.00 cm                          2D MOD LV SV:         55                               biplane is LV SV Index:   29                               43.7 %. LVOT Area:     3.14 cm                                Diastology                                LV e' medial:    5.57 cm/s LV Volumes (MOD)               LV E/e' medial:  20.5 LV vol d, MOD    81.2 ml       LV e' lateral:   10.60 cm/s A2C:                           LV E/e' lateral: 10.8 LV vol d, MOD    95.7 ml A4C: LV vol s, MOD    44.9 ml A2C: LV vol s, MOD    53.5 ml       3D Volume EF: A4C:                           3D EF:        40 % LV SV MOD A2C:   36.3 ml       LV EDV:       157 ml LV SV MOD A4C:   95.7 ml       LV ESV:       95 ml LV SV MOD BP:    39.3  ml       LV SV:        63 ml RIGHT VENTRICLE RV S prime:     10.80 cm/s TAPSE (M-mode): 1.6 cm LEFT ATRIUM             Index        RIGHT ATRIUM           Index LA diam:        4.90 cm 2.60 cm/m   RA Area:     19.00 cm LA Vol (A2C):   61.0 ml 32.32 ml/m  RA Volume:    55.30 ml  29.30 ml/m LA Vol (A4C):   86.2 ml 45.67 ml/m LA Biplane Vol: 78.0 ml 41.33 ml/m  AORTIC VALVE             PULMONIC VALVE LVOT Vmax:   92.00 cm/s  PR End Diast Vel: 7.84 msec LVOT Vmean:  57.200 cm/s LVOT VTI:    0.176 m AI PHT:      592 msec  AORTA Ao Root diam: 2.90 cm Ao Asc diam:  3.60 cm MITRAL VALVE                  TRICUSPID VALVE MV Area (PHT): 5.27 cm       TR Peak grad:   46.0 mmHg MV Area VTI:   1.65 cm       TR Vmax:        339.00 cm/s MV Peak grad:  6.7 mmHg MV Mean grad:  2.0 mmHg       SHUNTS MV Vmax:       1.29 m/s       Systemic VTI:  0.18 m MV Vmean:      71.8 cm/s      Systemic Diam: 2.00 cm MV Decel Time: 144 msec MR Peak grad:    85.4 mmHg MR Mean grad:    52.0 mmHg MR Vmax:         462.00 cm/s MR Vmean:        344.0 cm/s MR PISA:         1.01 cm MR PISA Eff ROA: 9 mm MR PISA Radius:  0.40 cm MV E velocity: 114.00 cm/s MV A velocity: 50.70 cm/s MV E/A ratio:  2.25 Chilton Si MD Electronically signed by Chilton Si MD Signature Date/Time: 09/17/2022/6:01:20 PM    Final    CT Chest High Resolution  Result Date: 09/08/2022 CLINICAL DATA:  Follow-up interstitial lung disease.  Former smoker. EXAM: CT CHEST WITHOUT CONTRAST TECHNIQUE: Multidetector CT imaging of the chest was performed following the standard protocol without intravenous contrast. High resolution imaging of the lungs, as well as inspiratory and expiratory imaging, was performed. RADIATION DOSE REDUCTION: This exam was performed according to the departmental dose-optimization program which includes automated exposure control, adjustment of the mA and/or kV according to patient size and/or use of iterative reconstruction technique. COMPARISON:  09/24/2021 high-resolution chest CT. FINDINGS: Cardiovascular: New mild cardiomegaly with stable 3 lead left subclavian pacemaker with lead tips in the right atrium and right ventricular apex. No significant pericardial effusion/thickening. Three-vessel coronary  atherosclerosis. Atherosclerotic nonaneurysmal thoracic aorta. Dilated main pulmonary artery (4.1 cm diameter). Mediastinum/Nodes: No significant thyroid nodules. Unremarkable esophagus. No axillary adenopathy. New mild right paratracheal adenopathy up to 1.1 cm (series 2/image 51). No discrete hilar adenopathy on these noncontrast images. Lungs/Pleura: No pneumothorax. Small dependent right pleural effusion. No significant left pleural effusion. No acute consolidative airspace disease, lung  masses or significant pulmonary nodules. Mild patchy air trapping in both lungs on the expiration sequence, similar. No evidence of tracheobronchomalacia. Mild-to-moderate interlobular septal thickening, subpleural reticulation and ground-glass opacity and mild traction bronchiectasis and architectural distortion throughout both lungs with no frank honeycombing. Slight basilar predominance to these findings. The septal thickening has worsened in the interval. Upper abdomen: Cholecystectomy. Nonobstructing 9 mm upper left renal stone. Musculoskeletal: No aggressive appearing focal osseous lesions. Moderate thoracic spondylosis. IMPRESSION: 1. Spectrum of findings suggestive of new mild congestive heart failure superimposed on chronic fibrotic interstitial lung disease. New mild cardiomegaly. Small dependent right pleural effusion. Worsened generalized pulmonary septal thickening compatible with cardiogenic pulmonary edema. 2. Assessment of progression of the underlying chronic interstitial lung disease is confounded by the above findings. Given mild air trapping, fibrotic NSIP remains the favored diagnosis, with UIP not excluded. Findings are indeterminate for UIP per consensus guidelines: Diagnosis of Idiopathic Pulmonary Fibrosis: An Official ATS/ERS/JRS/ALAT Clinical Practice Guideline. Am Rosezetta Schlatter Crit Care Med Vol 198, Iss 5, [phone], Jan 24 2017. 3. Dilated main pulmonary artery, suggesting pulmonary arterial hypertension.  4. Three-vessel coronary atherosclerosis. 5. Nonobstructing left nephrolithiasis. 6.  Aortic Atherosclerosis (ICD10-I70.0). Electronically Signed   By: Delbert Phenix M.D.   On: 09/08/2022 08:24      Additional studies/ records that were reviewed today include:   ECHO 1/12021  MPRESSIONS   1. Left ventricular ejection fraction, by visual estimation, is 50 to  55%. The left ventricle has normal function. There is no left ventricular  hypertrophy.   2. Elevated left atrial pressure.   3. Left ventricular diastolic parameters are consistent with Grade II  diastolic dysfunction (pseudonormalization).   4. The left ventricle has no regional wall motion abnormalities.   5. Global right ventricle has normal systolic function.The right  ventricular size is mildly enlarged.   6. Left atrial size was normal.   7. Right atrial size was normal.   8. Mild mitral annular calcification.   9. The mitral valve is normal in structure. Mild mitral valve  regurgitation. No evidence of mitral stenosis.  10. The tricuspid valve is normal in structure.  11. The aortic valve is tricuspid. Aortic valve regurgitation is trivial.  Mild aortic valve sclerosis without stenosis.  12. The pulmonic valve was normal in structure. Pulmonic valve  regurgitation is trivial.  13. Moderately elevated pulmonary artery systolic pressure.  14. A pacer wire is visualized.  15. The inferior vena cava is normal in size with greater than 50%  respiratory variability, suggesting right atrial pressure of 3 mmHg.  16. Normal LV systolic function; grade 2 diastolic dysfunction; trace AI;  mild MR; mild RVE; moderate pulmonary hypertension.   In comparison to the previous echocardiogram(s): 09/17/17 EF 55-60%. PA  pressure .     ECHO: 04/08/2021 IMPRESSIONS   1. Left ventricular ejection fraction, by estimation, is 60 to 65%. Left  ventricular ejection fraction by 3D volume is 61 %. The left ventricle has  normal  function. The left ventricle has no regional wall motion  abnormalities. There is mild left  ventricular hypertrophy. Left ventricular diastolic parameters are  consistent with Grade II diastolic dysfunction (pseudonormalization).   2. Right ventricular systolic function is normal. The right ventricular  size is normal. There is moderately elevated pulmonary artery systolic  pressure. The estimated right ventricular systolic pressure is 54.8 mmHg.   3. Left atrial size was moderately dilated.   4. Right atrial size was moderately dilated.   5. The  mitral valve is abnormal. Mild to moderate mitral valve  regurgitation. Moderate mitral annular calcification.   6. The tricuspid valve is abnormal. Tricuspid valve regurgitation is  moderate.   7. The aortic valve is tricuspid. Aortic valve regurgitation is trivial.  Aortic valve sclerosis is present, with no evidence of aortic valve  stenosis. Aortic regurgitation PHT measures 594 msec.   8. Cannot exclude a small PFO.   Comparison(s): Changes from prior study are noted. 06/07/2019: LVEF 50-55%.    R HEART CATH: 05/29/2021 Mild pulmonary hypertension in this patient with interstitial lung disease with elevated RV 54/12; PA pressure 55/18; mean 31 mmHg.  PVR 2.6 WU.    PW mean pressure is 17 mmHg, with V wave 24 c/w his echo documented mild - moderate mitral regurgitation.    The patient will resume his home medications commencing tomorrow including resumption of Xarelto.   ECHO; 09/17/2022   1. Left ventricular ejection fraction, by estimation, is 45 to 50%. Left  ventricular ejection fraction by 2D MOD biplane is 43.7 %. The left  ventricle has mildly decreased function. The left ventricle demonstrates  global hypokinesis. Left ventricular  diastolic parameters are consistent with Grade II diastolic dysfunction  (pseudonormalization). Elevated left ventricular end-diastolic pressure.   2. Right ventricular systolic function is moderately  reduced. The right  ventricular size is normal. There is severely elevated pulmonary artery  systolic pressure.   3. Left atrial size was severely dilated.   4. Right atrial size was mildly dilated.   5. The mitral valve is normal in structure. Mild mitral valve  regurgitation. No evidence of mitral stenosis.   6. Tricuspid valve regurgitation is moderate.   7. The aortic valve is tricuspid. Aortic valve regurgitation is mild. No  aortic stenosis is present. Aortic regurgitation PHT measures 592 msec.   8. The inferior vena cava is normal in size with greater than 50%  respiratory variability, suggesting right atrial pressure of 3 mmHg.    ASSESSMENT:    1. CAD in native artery   2. Dyspnea on exertion   3. Primary hypertension   4. ILD (interstitial lung disease) (HCC)   5. OSA (obstructive sleep apnea)   6. Pacemaker   7. Atrial flutter, unspecified type (HCC)   8. Secondary hypercoagulable state (HCC)   9. Iron deficiency anemia due to chronic blood loss      PLAN:  1. CAD: He is status post PCI with DES stenting to his left circumflex and OM1 vessel on August 12, 2019.  He is not having anginal symptoms.  He continues to be on nebivolol 5 mg daily, in addition to his telmisartan.  2.  Essential hypertension: Blood pressure today is stable on telmisartan 80 mg, nebivolol 5 mg with repeat blood pressure by me 124/60.  3.  Dyspnea: At cardiac catheterization in 2019 he had normal left and right filling pressures with mild pulmonary hypertension.  On echo Doppler study in January 2021 he had  grade 2 diastolic dysfunction, trace AI, mild MR, mild RVE and moderate pulmonary hypertension with estimated RV systolic pressure of 48.7 mm.  He has interstitial lung disease followed by Dr. Marchelle Gearing. Echo Doppler study from April 08, 2021 shows normal systolic function with EF at 60 to 65% and grade 2 diastolic dysfunction.  Estimated pulmonary systolic pressure is now further increased at  54.8 mmHg.  There is evidence for moderate biatrial enlargement, mild to moderate mitral regurgitation with mitral annular calcification and aortic valve sclerosis.  On right heart catheterization performed by me on May 29, 2021 pulmonary hypertension PA pressure 55/18, mean 31.  PVR 2.6 Woods units.  PW mean pressure was 17 with V wave up to 24 consistent with his mild to moderate mitral regurgitation.  Most recent echo from September 17, 2022 shows EF 45 to 50%, grade 2 diastolic dysfunction, and estimated PA pressure at 61 mmHg.  He is no longer on Esbriet for the last 2 months.  4.  Leg swelling right greater than left: I am adding spironolactone 12.5 mg daily.  This will be also helpful with his reduced LV function for aldosterone blockade.  5.  Obstructive sleep apnea: He continues to use CPAP therapy and denies any awareness of breakthrough snoring or residual daytime sleepiness.  Prior download showed an AHI of 1.3 without significant mask leak.    5.  Pacemaker: ECG shows AV paced rhythm.  Remote history of complete heart block.  He underwent pacemaker change out as well as new atrial lead placement by [name].  When most recently seen by [name] on April 12, 2021 he was in atrial flutter and he was successfully atrially paced back to sinus rhythm.  ECG today shows 100% paced rhythm with underlying atrial flutter.  7.  Hyperlipidemia: He continues to be on pravastatin 20 mg daily.  Target LDL less than 70.  8.  BPH:  followed by [facility] urology  9.  Iron deficiency, currently undergoing iron transfusion therapy with Dr. Orlie Dakin of hematology.  9.  Type 2 diabetes mellitus: He has lost over 30 pounds.  He is now on Jardiance and does not appear to be on metformin    10.  Hypothyroidism: On Armour Thyroid 90 mg daily, followed by Dr. Judithann Sheen.    I have recommended a follow-up evaluation with Azalee Course, PA in 4 months and see me in 8 months.   Medication Adjustments/Labs and  Tests Ordered: Current medicines are reviewed at length with the patient today.  Concerns regarding medicines are outlined above.  Medication changes, Labs and Tests ordered today are listed in the Patient Instructions below.   Patient Instructions  Medication Instructions:  Start Aldactone 25 mg take 1/2 tablet ( 12.5 mg ) daily Continue all other medications *If you need a refill on your cardiac medications before your next appointment, please call your pharmacy*   Lab Work: None ordered   Testing/Procedures: None ordered   Follow-Up: At [facility], you and your health needs are our priority.  As part of our continuing mission to provide you with exceptional heart care, we have created designated Provider Care Teams.  These Care Teams include your primary Cardiologist (physician) and Advanced Practice Providers (APPs -  Physician Assistants and Nurse Practitioners) who all work together to provide you with the care you need, when you need it.  We recommend signing up for the patient portal called "MyChart".  Sign up information is provided on this After Visit Summary.  MyChart is used to connect with patients for Virtual Visits (Telemedicine).  Patients are able to view lab/test results, encounter notes, upcoming appointments, etc.  Non-urgent messages can be sent to your provider as well.   To learn more about what you can do with MyChart, go to [email or web address].    Your next appointment:  4 months    Provider:  Azalee Course PA   Dr.Shardae Kleinman in 8 months     Signed, Nicki Guadalajara, MD  09/27/2022 11:04 AM  [facility] [address] [place], [place], [place]  [address] Phone: [phone]

## 2022-09-24 NOTE — Progress Notes (Signed)
No appetite, food tastes like metal, weak, and tired.

## 2022-09-25 ENCOUNTER — Ambulatory Visit: Payer: Medicare Other

## 2022-09-25 ENCOUNTER — Encounter: Payer: Self-pay | Admitting: Internal Medicine

## 2022-09-25 ENCOUNTER — Telehealth: Payer: Self-pay

## 2022-09-25 ENCOUNTER — Ambulatory Visit (INDEPENDENT_AMBULATORY_CARE_PROVIDER_SITE_OTHER): Payer: Medicare Other | Admitting: Internal Medicine

## 2022-09-25 VITALS — BP 120/60 | HR 70 | Ht 67.0 in | Wt 158.4 lb

## 2022-09-25 DIAGNOSIS — J849 Interstitial pulmonary disease, unspecified: Secondary | ICD-10-CM | POA: Diagnosis not present

## 2022-09-25 DIAGNOSIS — R432 Parageusia: Secondary | ICD-10-CM | POA: Diagnosis not present

## 2022-09-25 DIAGNOSIS — R0609 Other forms of dyspnea: Secondary | ICD-10-CM | POA: Diagnosis not present

## 2022-09-25 DIAGNOSIS — R634 Abnormal weight loss: Secondary | ICD-10-CM

## 2022-09-25 NOTE — Progress Notes (Deleted)
Electrophysiology Office Follow up Visit Note:    Date:  09/25/2022   ID:  Cameron [name], DOB 04/04/1941, MRN [id number]  PCP:  Marguarite Arbour, MD  [facility] Cardiologist:  Nicki Guadalajara, MD  [facility] Electrophysiologist:  Hillis Range, MD (Inactive)    Interval History:    Cameron [name] is a 82 y.o. male who presents for a follow up visit.   I am not met the patient prior to today.  He was previously followed by [name].  He was last seen by [name] February 19, 2022.  He also follows with [name].  The patient has a medical history that includes atrial fibrillation, coronary artery disease, complete heart block post permanent pacemaker, hypertension, obstructive sleep apnea and a permanent pacemaker that was implanted October 2013.  There is been an increase in his atrial fibrillation since February of this year.  He had a recent bleeding episode that led to his Eliquis being reduced to 2.5 mg by mouth twice daily.  The patient was recently admitted to the hospital in December 2023 with weakness and lightheadedness.  He was found to be anemic with a hemoglobin of 6 and required 2 units of PRBCs.  During the hospitalization he was changed from Xarelto to Eliquis.    Past medical, surgical, social and family history were reviewed.  ROS:   Please see the history of present illness.    All other systems reviewed and are negative.  EKGs/Labs/Other Studies Reviewed:    The following studies were reviewed today:  Sep 26, 2022 in clinic device interrogation personally reviewed ***   September 17, 2022 echo EF 45% RV function moderately reduced Severely dilated left atrium Right atrium mildly dilated Mild MR Moderate TR    Physical Exam:    VS:  There were no vitals taken for this visit.    Wt Readings from Last 3 Encounters:  09/25/22 158 lb 6.4 oz (71.8 kg)  09/24/22 162 lb (73.5 kg)  09/24/22 158 lb (71.7 kg)     GEN: *** Well nourished, well developed  in no acute distress CARDIAC: ***RRR, no murmurs, rubs, gallops RESPIRATORY:  Clear to auscultation without rales, wheezing or rhonchi       ASSESSMENT:    No diagnosis found. PLAN:    In order of problems listed above:  #Atrial fibrillation On Eliquis for stroke prophylaxis He should be on 5 mg by mouth twice daily given his age, weight and kidney function*** The patient has a recent history of severe GI bleeding requiring transfusion.  We discussed alternatives to anticoagulation today including a Watchman device.  ----------  I have seen Cameron [name] in the office today who is being considered for a Watchman left atrial appendage closure device. I believe they will benefit from this procedure given their history of atrial fibrillation, CHA2DS2-VASc score of 5. Unfortunately, the patient is not felt to be a long term anticoagulation candidate secondary to severe anemia requiring transfusion. The patient's chart has been reviewed and I feel that they would be a candidate for short term oral anticoagulation after Watchman implant.   It is my belief that after undergoing a LAA closure procedure, Cameron [name] will not need long term anticoagulation which eliminates anticoagulation side effects and major bleeding risk.   Procedural risks for the Watchman implant have been reviewed with the patient including a 0.5% risk of stroke, <1% risk of perforation and <1% risk of device embolization. Other risks include bleeding,  vascular damage, tamponade, worsening renal function, and death. The patient understands these risk and wishes to proceed.     The published clinical data on the safety and effectiveness of WATCHMAN include but are not limited to the following: - Holmes DR, Everlene Farrier, Sick P et al. for the PROTECT AF Investigators. Percutaneous closure of the left atrial appendage versus warfarin therapy for prevention of stroke in patients with atrial fibrillation: a randomised  non-inferiority trial. Lancet 2009; 374: 534-42. Everlene Farrier, Doshi SK, Isa Rankin D et al. on behalf of the PROTECT AF Investigators. Percutaneous Left Atrial Appendage Closure for Stroke Prophylaxis in Patients With Atrial Fibrillation 2.3-Year Follow-up of the PROTECT AF (Watchman Left Atrial Appendage System for Embolic Protection in Patients With Atrial Fibrillation) Trial. Circulation 2013; 127:720-729. - Alli O, Doshi S,  Kar S, Reddy VY, Sievert H et al. Quality of Life Assessment in the Randomized PROTECT AF (Percutaneous Closure of the Left Atrial Appendage Versus Warfarin Therapy for Prevention of Stroke in Patients With Atrial Fibrillation) Trial of Patients at Risk for Stroke With Nonvalvular Atrial Fibrillation. J Am Coll Cardiol 2013; 61:1790-8. Aline August DR, Mia Creek, Price M, Whisenant B, Sievert H, Doshi S, Huber K, Reddy V. Prospective randomized evaluation of the Watchman left atrial appendage Device in patients with atrial fibrillation versus long-term warfarin therapy; the PREVAIL trial. Journal of the [employer] of Cardiology, Vol. 4, No. 1, 2014, 1-11. - Kar S, Doshi SK, Sadhu A, Horton R, Osorio J et al. Primary outcome evaluation of a next-generation left atrial appendage closure device: results from the PINNACLE FLX trial. Circulation 2021;143(18)1754-1762.    After today's visit with the patient which was dedicated solely for shared decision making visit regarding LAA closure device, the patient decided to proceed with the LAA appendage closure procedure scheduled to be done in the near future at [facility]. Prior to the procedure, I would like to obtain a gated CT scan of the chest with contrast timed for PV/LA visualization.   HAS-BLED score 3 Hypertension Yes  Abnormal renal and liver function (Dialysis, transplant, Cr >2.26 mg/dL /Cirrhosis or Bilirubin >2x Normal or AST/ALT/AP >3x Normal) No  Stroke No  Bleeding Yes  Labile INR (Unstable/high INR) No   Elderly (>65) Yes  Drugs or alcohol (? 8 drinks/week, anti-plt or NSAID) No   CHA2DS2-VASc Score = 5  The patient's score is based upon: CHF History: 0 HTN History: 1 Diabetes History: 1 Stroke History: 0 Vascular Disease History: 1 Age Score: 2 Gender Score: 0   Discussed anticoagulation strategies around the time of watchman implant.  Also discussed using DAPT.  Discussed limited data supporting use of half dose Eliquis around the time of watchman implant in patients at high risk of bleeding complication.***     Signed, Steffanie Dunn, MD, [facility], [facility] 09/25/2022 10:27 PM    Electrophysiology [facility]

## 2022-09-25 NOTE — Telephone Encounter (Signed)
RN called spouse to check on his arm from the iron infusion. Patient was with her and he stated it was fine and they didn't go pick up the cream that was sent in. RN stated if he didn't need it then they didn't have to pick it up but it would be available if needed. Wife wanted me to rely a message to Dr. Orlie Dakin that he went to the pulmonary doctor and the patient is still losing weight. Stated that the pulmonary MD didn't know what the cause is but needed to find out. Stated the patient's weight was 154lbs. Also, that the Iron infusion did help. RN will let Dr. Orlie Dakin know.

## 2022-09-26 ENCOUNTER — Encounter: Payer: Self-pay | Admitting: Cardiology

## 2022-09-26 ENCOUNTER — Ambulatory Visit: Payer: Medicare Other | Attending: Cardiology | Admitting: Cardiology

## 2022-09-26 VITALS — BP 116/80 | HR 70 | Ht 67.0 in | Wt 159.4 lb

## 2022-09-26 DIAGNOSIS — I495 Sick sinus syndrome: Secondary | ICD-10-CM | POA: Diagnosis not present

## 2022-09-26 DIAGNOSIS — I10 Essential (primary) hypertension: Secondary | ICD-10-CM | POA: Diagnosis present

## 2022-09-26 DIAGNOSIS — I4891 Unspecified atrial fibrillation: Secondary | ICD-10-CM | POA: Diagnosis present

## 2022-09-26 DIAGNOSIS — K922 Gastrointestinal hemorrhage, unspecified: Secondary | ICD-10-CM | POA: Diagnosis present

## 2022-09-26 DIAGNOSIS — Z950 Presence of cardiac pacemaker: Secondary | ICD-10-CM | POA: Insufficient documentation

## 2022-09-26 NOTE — Patient Instructions (Signed)
Medication Instructions:  Your physician recommends that you continue on your current medications as directed. Please refer to the Current Medication list given to you today.  Please take your Eliquis TWICE a day  *If you need a refill on your cardiac medications before your next appointment, please call your pharmacy*   Lab Work: None ordered If you have labs (blood work) drawn today and your tests are completely normal, you will receive your results only by: MyChart Message (if you have MyChart) OR A paper copy in the mail If you have any lab test that is abnormal or we need to change your treatment, we will call you to review the results.   Testing/Procedures: None ordered   Follow-Up: At [facility], you and your health needs are our priority.  As part of our continuing mission to provide you with exceptional heart care, we have created designated Provider Care Teams.  These Care Teams include your primary Cardiologist (physician) and Advanced Practice Providers (APPs -  Physician Assistants and Nurse Practitioners) who all work together to provide you with the care you need, when you need it.  Remote monitoring is used to monitor your Pacemaker or ICD from home. This monitoring reduces the number of office visits required to check your device to one time per year. It allows [place] to keep an eye on the functioning of your device to ensure it is working properly. You are scheduled for a device check from home on 11/06/22. You may send your transmission at any time that day. If you have a wireless device, the transmission will be sent automatically. After your physician reviews your transmission, you will receive a postcard with your next transmission date.  Your next appointment:   3 month(s)  The format for your next appointment:   In Person  Provider:   Steffanie Dunn, MD{   Thank you for choosing [facility]!!   [phone]    Other Instructions

## 2022-09-26 NOTE — Progress Notes (Addendum)
Electrophysiology Office Follow up Visit Note:    Date:  09/26/2022   ID:  Cameron [name], DOB 10/26/1940, MRN [id number]  PCP:  Marguarite Arbour, MD  [facility] Cardiologist:  Nicki Guadalajara, MD  [facility] Electrophysiologist:  Cameron Range, MD (Inactive)    Interval History:    Cameron [name] is a 82 y.o. male who presents for a follow up visit.   I had not met the patient prior to today.  He was previously followed by Dr. Johney [name].  He was last seen by [facility] February 19, 2022.  He also follows with [name].  The patient has a medical history that includes atrial fibrillation, coronary artery disease, complete heart block post permanent pacemaker, hypertension, obstructive sleep apnea and a permanent pacemaker that was implanted October 2013.  There has been an increase in his atrial fibrillation since February of this year.  He had a recent bleeding episode that led to his Eliquis being reduced to 2.5 mg by mouth twice daily.  The patient was recently admitted to the hospital in December 2023 with weakness and lightheadedness.  He was found to be anemic with a hemoglobin of 6 and required 2 units of PRBCs.  During the hospitalization he was changed from Xarelto to Eliquis.  Today, he is accompanied by a family member. He has never been able to feel his Afib episodes. Currently he is taking 2.5 mg Eliquis once a day. He is not on ASA per his PCP.  He complains of having no energy and feeling fatigued all the time. He is mostly concerned that he is continuously losing weight, and no etiology has been determined. In the past 2 months he has lost 30 lbs. He is monitoring his weight at home daily.  For the last 3 months he has been struggling with eating due to "everything having a metallic taste". He states that steaks and hamburgers taste the same like they are supposed to, but he is unable to tolerate potatoes or anything fried. He could eat green beans, but not butter beans. For  nutrition he has been drinking a lot of protein shakes. They report that he will be referred to a nutritionist.  Additionally he complains of irregular bowel movements and constipation. He has been taking a stool softener, but his stools are still hard.  He is a former smoker: he smoked a little in high school, and while in [place]. He quit when he returned home 60 years ago.  He denies any palpitations, chest pain, shortness of breath, or peripheral edema. No lightheadedness, headaches, syncope, orthopnea, or PND.     Past medical, surgical, social and family history were reviewed.  ROS:   Please see the history of present illness.    (+) Fatigue/Malaise (+) Unintentional weight loss (+) Metallic taste (+) Constipation All other systems reviewed and are negative.  EKGs/Labs/Other Studies Reviewed:    The following studies were reviewed today:  Sep 26, 2022 in clinic device interrogation personally reviewed Battery longevity 7.3 years Lead parameter stable Ventricular pacing greater than 99% Atrial pacing 69%   September 17, 2022 echo EF 45% RV function moderately reduced Severely dilated left atrium Right atrium mildly dilated Mild MR Moderate TR    Physical Exam:    VS:  BP 116/80   Pulse 70   Ht 5\' 7"  (1.702 m)   Wt 159 lb 6.4 oz (72.3 kg)   SpO2 96%   BMI 24.97 kg/m     Wt  Readings from Last 3 Encounters:  09/26/22 159 lb 6.4 oz (72.3 kg)  09/25/22 158 lb 6.4 oz (71.8 kg)  09/24/22 162 lb (73.5 kg)     GEN: Well nourished, well developed in no acute distress CARDIAC: RRR, no murmurs, rubs, gallops. PPM pocket well healed. RESPIRATORY:  Clear to auscultation without rales, wheezing or rhonchi       ASSESSMENT:    1. SSS (sick sinus syndrome) (HCC)   2. Pacemaker   3. Atrial fibrillation, unspecified type (HCC)   4. Gastrointestinal hemorrhage, unspecified gastrointestinal hemorrhage type   5. Primary hypertension    PLAN:    In order of problems  listed above:  #Atrial fibrillation On Eliquis for stroke prophylaxis Technically, he should be on 5 mg by mouth twice daily given his age, weight and kidney function.  Given his recent bleed he is only been taking 2.5 mg by mouth once daily.  We discussed this at length during today's appointment.  For now, he will increase to 2.5 mg by mouth twice daily of Eliquis.  We discussed how this is again not the recommended dose but would be an improvement over what his current once daily regimen is.  We discussed watchman implant in detail during today's appointment.  I am concerned about his weight loss and general failure to thrive over the last few months.  Because of his unintentional weight loss, I do not think he is currently a candidate for watchman implant.  Also think his hemoglobin has not proven stable after recent hospitalization for anemia requiring transfusion.  For now, we have decided to regroup in about 3 months to reassess his hemoglobin stability and weight stability.  If both remain stable over the next few months, can revisit watchman eligibility.  For now we will need to continue with a rate control strategy for his atrial fibrillation given his an adequate anticoagulation.  We will revisit this at the 36-month follow-up appointment.  ----------  HAS-BLED score 3 Hypertension Yes  Abnormal renal and liver function (Dialysis, transplant, Cr >2.26 mg/dL /Cirrhosis or Bilirubin >2x Normal or AST/ALT/AP >3x Normal) No  Stroke No  Bleeding Yes  Labile INR (Unstable/high INR) No  Elderly (>65) Yes  Drugs or alcohol (? 8 drinks/week, anti-plt or NSAID) No   CHA2DS2-VASc Score = 5  The patient's score is based upon: CHF History: 0 HTN History: 1 Diabetes History: 1 Stroke History: 0 Vascular Disease History: 1 Age Score: 2 Gender Score: 0   #Weight loss Unclear cause.  Concerning for underlying malignancy.  He reports a metallic taste with food leading to reduced caloric  consumption.  He is lost 20 to 30 pounds.  Workup thus far unrevealing.  I do not think it is related to his atrial fibrillation given the symptom/weight loss timeline.  I encouraged follow-up with his primary care physician.  Has an upcoming appointment with a nutritionist.  #Hypertension At goal today.  Recommend checking blood pressures 1-2 times per week at home and recording the values.  Recommend bringing these recordings to the primary care physician.    I,Mathew Stumpf,acting as a Neurosurgeon for Lanier Prude, MD.,have documented all relevant documentation on the behalf of Lanier Prude, MD,as directed by  Lanier Prude, MD while in the presence of Lanier Prude, MD.  I, Lanier Prude, MD, have reviewed all documentation for this visit. The documentation on 09/26/22 for the exam, diagnosis, procedures, and orders are all accurate and complete.  Signed, Steffanie Dunn, MD, [facility] 09/26/2022 4:49 PM    Electrophysiology [facility]

## 2022-09-27 ENCOUNTER — Encounter: Payer: Self-pay | Admitting: Cardiovascular Disease

## 2022-09-29 ENCOUNTER — Ambulatory Visit: Payer: Medicare Other

## 2022-10-01 ENCOUNTER — Inpatient Hospital Stay: Payer: Medicare Other

## 2022-10-01 VITALS — BP 122/65 | HR 69 | Temp 97.9°F

## 2022-10-01 DIAGNOSIS — D509 Iron deficiency anemia, unspecified: Secondary | ICD-10-CM | POA: Diagnosis not present

## 2022-10-01 MED ORDER — SODIUM CHLORIDE 0.9 % IV SOLN
200.0000 mg | Freq: Once | INTRAVENOUS | Status: AC
  Administered 2022-10-01: 200 mg via INTRAVENOUS
  Filled 2022-10-01: qty 200

## 2022-10-01 MED ORDER — SODIUM CHLORIDE 0.9 % IV SOLN
Freq: Once | INTRAVENOUS | Status: AC
  Filled 2022-10-01: qty 250

## 2022-10-02 ENCOUNTER — Ambulatory Visit: Payer: Medicare Other | Admitting: Internal Medicine

## 2022-10-02 DIAGNOSIS — Z0279 Encounter for issue of other medical certificate: Secondary | ICD-10-CM

## 2022-10-02 MED FILL — Iron Sucrose Inj 20 MG/ML (Fe Equiv): INTRAVENOUS | Qty: 10 | Status: AC

## 2022-10-03 ENCOUNTER — Inpatient Hospital Stay: Payer: Medicare Other

## 2022-10-03 VITALS — BP 129/63 | HR 85 | Temp 97.8°F | Resp 16

## 2022-10-03 DIAGNOSIS — D509 Iron deficiency anemia, unspecified: Secondary | ICD-10-CM

## 2022-10-03 MED ORDER — SODIUM CHLORIDE 0.9 % IV SOLN
Freq: Once | INTRAVENOUS | Status: AC
  Filled 2022-10-03: qty 250

## 2022-10-03 MED ORDER — SODIUM CHLORIDE 0.9 % IV SOLN
200.0000 mg | Freq: Once | INTRAVENOUS | Status: AC
  Administered 2022-10-03: 200 mg via INTRAVENOUS
  Filled 2022-10-03: qty 200

## 2022-10-06 ENCOUNTER — Telehealth: Payer: Self-pay | Admitting: Cardiovascular Disease

## 2022-10-06 NOTE — Telephone Encounter (Signed)
Wife stated they left paperwork with Dr. Lalla Brothers at patient's last visit and called to check if the paperwork needed to cancel travel arrangements scheduled in June is completed.

## 2022-10-06 NOTE — Telephone Encounter (Signed)
Spoke with the patient's wife and advised that paperwork has not been completed. Will talk with Dr. Lalla Brothers when he is back in clinic tomorrow to complete paperwork.

## 2022-10-08 ENCOUNTER — Inpatient Hospital Stay: Payer: Medicare Other

## 2022-10-08 VITALS — BP 117/60 | HR 69 | Temp 97.0°F | Resp 18

## 2022-10-08 DIAGNOSIS — D509 Iron deficiency anemia, unspecified: Secondary | ICD-10-CM | POA: Diagnosis not present

## 2022-10-08 DIAGNOSIS — E1122 Type 2 diabetes mellitus with diabetic chronic kidney disease: Secondary | ICD-10-CM | POA: Diagnosis present

## 2022-10-08 DIAGNOSIS — N200 Calculus of kidney: Secondary | ICD-10-CM | POA: Insufficient documentation

## 2022-10-08 MED ORDER — SODIUM CHLORIDE 0.9 % IV SOLN
Freq: Once | INTRAVENOUS | Status: AC
  Filled 2022-10-08: qty 250

## 2022-10-08 MED ORDER — SODIUM CHLORIDE 0.9 % IV SOLN
200.0000 mg | Freq: Once | INTRAVENOUS | Status: AC
  Administered 2022-10-08: 200 mg via INTRAVENOUS
  Filled 2022-10-08: qty 200

## 2022-10-08 NOTE — Telephone Encounter (Signed)
Patient's Trip Cancellation form has been completed and scanned into his documents.  Patient's spouse, Chip Boer, will be coming to pick up paperwork today.

## 2022-10-14 ENCOUNTER — Ambulatory Visit: Payer: Medicare Other | Admitting: Physician Assistant

## 2022-10-16 ENCOUNTER — Ambulatory Visit (INDEPENDENT_AMBULATORY_CARE_PROVIDER_SITE_OTHER): Payer: Medicare Other | Admitting: Internal Medicine

## 2022-10-16 ENCOUNTER — Other Ambulatory Visit: Payer: Medicare Other

## 2022-10-16 VITALS — BP 130/60 | HR 80 | Ht 67.0 in | Wt 155.0 lb

## 2022-10-16 DIAGNOSIS — K315 Obstruction of duodenum: Secondary | ICD-10-CM

## 2022-10-16 DIAGNOSIS — R634 Abnormal weight loss: Secondary | ICD-10-CM | POA: Diagnosis not present

## 2022-10-16 DIAGNOSIS — R899 Unspecified abnormal finding in specimens from other organs, systems and tissues: Secondary | ICD-10-CM

## 2022-10-16 NOTE — Progress Notes (Signed)
Chief Complaint: IDA  HPI : 82 year old male with history of A-fib on Eliquis, heart block s/p PM, CAD s/p PCI, OSA on CPAP, COPD, hypothyroidism, psoriatic arthritis, DM presents for follow up of IDA and weight loss  Interval History: He has lost 18 lbs since his last GI clinic visit. When he eats, the food tastes metallic. He is doing protein nutritional supplements 1-2 times per day when he doesn't eat a full meal. He does tolerate eating chicken and steak. However, he tends to feel full easily after eating. Denies N&V or dysphagia. He did see a nutritionist but did not find any signficiant benefit from that visit. Denies blood in the stools. He recently completed his IV iron infusions and feels that his energy levels are good now. Denies diarrhea. He had a test called GI 360 with [facility], which showed positive C dif toxin, low fecal elastase, and high fecal calprotectin.  Wt Readings from Last 3 Encounters:  10/16/22 155 lb (70.3 kg)  09/26/22 159 lb 6.4 oz (72.3 kg)  09/25/22 158 lb 6.4 oz (71.8 kg)   Current Outpatient Medications  Medication Sig Dispense Refill   acetaminophen (TYLENOL) 500 MG tablet Take 500 mg by mouth every 6 (six) hours as needed for moderate pain.     allopurinol (ZYLOPRIM) 300 MG tablet Take 300 mg by mouth daily.     apixaban (ELIQUIS) 2.5 MG TABS tablet Take by mouth 2 (two) times daily.     BERBERINE CHLORIDE PO Take 1,500 mg by mouth.     colchicine 0.6 MG tablet Take 0.6 mg by mouth daily.     JARDIANCE 10 MG TABS tablet Take 10 mg by mouth daily.     leflunomide (ARAVA) 10 MG tablet Take 1 tablet by mouth daily.     megestrol (MEGACE) 20 MG tablet Take 20 mg by mouth daily.     nebivolol (BYSTOLIC) 5 MG tablet Take 5 mg by mouth daily.     nitroGLYCERIN (NITROSTAT) 0.4 MG SL tablet Place 1 tablet (0.4 mg total) under the tongue every 5 (five) minutes as needed for chest pain. 75 tablet 2   NON FORMULARY 500 mg. Beef liver     Omega-3 Fatty Acids  (OMEGA-3 FISH OIL PO) Take 1 capsule by mouth daily.     pravastatin (PRAVACHOL) 20 MG tablet Take 1 tablet (20 mg total) by mouth every evening. 90 tablet 3   spironolactone (ALDACTONE) 25 MG tablet Take 1/2 tablet ( 12.5 mg ) daily 45 tablet 3   telmisartan (MICARDIS) 80 MG tablet Take 1 tablet (80 mg total) by mouth daily. 90 tablet 3   thyroid (ARMOUR) 90 MG tablet Take 90 mg by mouth every morning.      triamcinolone ointment (KENALOG) 0.5 % Apply 1 Application topically 2 (two) times daily. (Patient taking differently: Apply 1 Application topically as needed (rashes).) 30 g 0   Trospium Chloride 60 MG CP24 TAKE 1 CAPSULE BY MOUTH DAILY (Patient taking differently: Take 1 capsule by mouth daily.) 30 capsule 2   TURMERIC PO Take 720 mg by mouth.     VTAMA 1 % CREA APPLY 1 APPLICATION TOPICALLY DAILY 60 g 5   Current Facility-Administered Medications  Medication Dose Route Frequency Provider Last Rate Last Admin   sodium chloride flush (NS) 0.9 % injection 3 mL  3 mL Intravenous Q12H Lennette Bihari, MD       Facility-Administered Medications Ordered in Other Visits  Medication Dose Route  Frequency Provider Last Rate Last Admin   ondansetron (ZOFRAN) 4 mg in sodium chloride 0.9 % 50 mL IVPB  4 mg Intravenous Q6H PRN Coletta Memos, MD       Physical Exam: BP 130/60   Pulse 80   Ht 5\' 7"  (1.702 m)   Wt 155 lb (70.3 kg)   BMI 24.28 kg/m  Constitutional: Pleasant,well-developed, male in no acute distress. HEENT: Normocephalic and atraumatic. Conjunctivae are normal. No scleral icterus. Cardiovascular: Normal rate, regular rhythm.  Pulmonary/chest: Effort normal and breath sounds normal. No wheezing, rales or rhonchi. Abdominal: Soft, nondistended, nontender. Bowel sounds active throughout. There are no masses palpable. No hepatomegaly. Extremities: No edema Neurological: Alert and oriented to person place and time. Skin: Skin is warm and dry. No rashes noted. Psychiatric: Normal mood  and affect. Behavior is normal.  Labs 05/2022: CBC with low Hb of 10.6. CMP with elevated glucose of 196. TSH nml.   Labs 08/2022: CBC with low Hb of 11.9. TSH nml. Vit B12 nml. HbA1C 7.8%. Vit D nml.   Labs 09/2022: BMP with elevated BUN of 38 and elevated Cr of 1.31. CBC with nml Hb of 13.9  CT Enterography 05/29/22: IMPRESSION: 1. No acute abdominopelvic findings. 2. Non formed stool in the LEFT colon. 3. Small bilateral pleural effusions.  EGD 05/24/22:   Colonoscopy 05/25/22:   VCE 05/25/22   ASSESSMENT AND PLAN: IDA - resolved Fatigue - resolved Altered taste Weight loss Duodenal stenosis Patient presents for follow up of IDA and weight loss. His IDA has now completely resolved with IV iron infusions. Although his GI work up did not reveal any clear etiology for his blood loss, I am reassured by the fact that his anemia has now resolved with iron supplementation. Patient has continued to have weight loss over time, which is likely due to a combination of poor appetite and a sensation of fullness when he eats. I did encourage the patient to take nutritional supplements at least 3 times per day in order to try to curb his weight loss. I did offer the patient the opportunity for a repeat EGD for evaluation and potential dilation of duodenal stenosis, which may be contributing to his sensation of fullness after he eats, but he would like to hold off at this time. I told him to reach out to his PCP to see if there was any additional work up that would be necessary for his weight loss. Patient did interestingly have a 3rd party lab that was done that described a finding of positive C dif toxin, low fecal elastase, and elevated fecal calprotectin. However, the patient is not experiencing any diarrhea, which would typically be expected with these lab findings. Thus I will plan to repeat these lab tests to see if any of them return as abnormal. - Check Diatherix GI path stool test, fecal  elastase, fecal calprotectin - Start taking 3 nutritional supplements per day - Patient declined EGD at this time - Recall for colonoscopy in 04/2027 due to fair prep on last colonoscopy - RTC PRN  Eulah Pont, MD  I spent 42 minutes of time, including in depth chart review, independent review of results as outlined above, communicating results with the patient directly, face-to-face time with the patient, coordinating care, ordering studies and medications as appropriate, and documentation.

## 2022-10-16 NOTE — Patient Instructions (Addendum)
Start taking 3 protein supplements per day   Your provider has requested that you go to the basement level for lab work before leaving today. Press "B" on the elevator. The lab is located at the first door on the left as you exit the elevator.   Your provider has ordered "[facility]" stool testing for you. You have received a kit from our office today containing all necessary supplies to complete this test. Please carefully read the stool collection instructions provided in the kit before opening the accompanying materials. In addition, be sure to place the label from the top left corner of the laboratory request sheet onto the "puritan opti-swab" tube that is supplied in the kit. This label should include your full name and date of birth. After completing the test, you should secure the purtian tube into the specimen biohazard bag. The laboratory request information sheet (including date and time of specimen collection) should be placed into the outside pocket of the specimen biohazard bag and returned to the [place] lab with 2 days of collection.    If your blood pressure at your visit was 140/90 or greater, please contact your primary care physician to follow up on this.  _______________________________________________________  If you are age 53 or older, your body mass index should be between 23-30. Your Body mass index is 24.28 kg/m. If this is out of the aforementioned range listed, please consider follow up with your Primary Care Provider.  If you are age 7 or younger, your body mass index should be between 19-25. Your Body mass index is 24.28 kg/m. If this is out of the aformentioned range listed, please consider follow up with your Primary Care Provider.   ________________________________________________________  The [facility] providers would like to encourage you to use [facility] to communicate with providers for non-urgent requests or questions.  Due to long hold times on the telephone,  sending your provider a message by [facility] may be a faster and more efficient way to get a response.  Please allow 48 business hours for a response.  Please remember that this is for non-urgent requests.  _______________________________________________________   Due to recent changes in healthcare laws, you may see the results of your imaging and laboratory studies on MyChart before your provider has had a chance to review them.  We understand that in some cases there may be results that are confusing or concerning to you. Not all laboratory results come back in the same time frame and the provider may be waiting for multiple results in order to interpret others.  Please give [place] 48 hours in order for your provider to thoroughly review all the results before contacting the office for clarification of your results.    .sher Thank you for entrusting me with your care and for choosing [facility], Dr. Eulah Pont

## 2022-10-21 ENCOUNTER — Other Ambulatory Visit: Payer: Medicare Other

## 2022-10-21 DIAGNOSIS — K315 Obstruction of duodenum: Secondary | ICD-10-CM

## 2022-10-21 DIAGNOSIS — R899 Unspecified abnormal finding in specimens from other organs, systems and tissues: Secondary | ICD-10-CM

## 2022-10-21 DIAGNOSIS — R634 Abnormal weight loss: Secondary | ICD-10-CM

## 2022-10-22 ENCOUNTER — Telehealth: Payer: Self-pay | Admitting: Cardiovascular Disease

## 2022-10-22 NOTE — Telephone Encounter (Signed)
[facility] is calling to get a call back to discuss CPAP supplies and to see if a form was received with a corrected date for supplies. Requesting return call.

## 2022-10-24 ENCOUNTER — Telehealth: Payer: Self-pay

## 2022-10-24 ENCOUNTER — Encounter: Payer: Self-pay | Admitting: Internal Medicine

## 2022-10-24 ENCOUNTER — Other Ambulatory Visit: Payer: Self-pay

## 2022-10-24 MED ORDER — FIDAXOMICIN 200 MG PO TABS: 200.0000 mg | ORAL_TABLET | Freq: Two times a day (BID) | ORAL | 0 refills | Status: DC

## 2022-10-24 NOTE — Telephone Encounter (Signed)
Inbound call from patient's wife requesting to speak about also being tested for C Diff. Please advise, thank you.

## 2022-10-24 NOTE — Progress Notes (Signed)
Patient's [facility] Gastrointestinal test with C dif 10/21/22 was positive for C dif toxin. [name], please call him to let him know and send him fidaxomicin 200 mg BID for 10 days as treatment.

## 2022-10-24 NOTE — Telephone Encounter (Signed)
Called patient back. Voicemail answered. Left message to contact the PCP, give her symptoms and the information of the family member having C Diff. If established with [facility], call, give the symptoms and we can ask the provider about testing.

## 2022-10-24 NOTE — Telephone Encounter (Signed)
Spoke with patient and family member on speaker phone. Advised of positive C Diff toxin test. Discussed treatment, and personal hygiene. Encouraged questions. No questions at this time. Medication to [facility].

## 2022-10-28 ENCOUNTER — Other Ambulatory Visit: Payer: Self-pay | Admitting: Physician Assistant

## 2022-10-28 ENCOUNTER — Other Ambulatory Visit: Payer: Medicare Other

## 2022-10-28 ENCOUNTER — Ambulatory Visit
Admission: RE | Admit: 2022-10-28 | Discharge: 2022-10-28 | Disposition: A | Discharge status: Home / Self Care | Payer: Medicare Other | Source: Ambulatory Visit | Attending: Physician Assistant | Admitting: Physician Assistant

## 2022-10-28 DIAGNOSIS — G8929 Other chronic pain: Secondary | ICD-10-CM | POA: Diagnosis present

## 2022-10-28 DIAGNOSIS — R519 Headache, unspecified: Secondary | ICD-10-CM

## 2022-10-28 LAB — PANCREATIC ELASTASE, FECAL: Pancreatic Elastase-1, Stool: 317 mcg/g

## 2022-10-28 LAB — CALPROTECTIN, FECAL: Calprotectin, Fecal: 14 ug/g (ref 0–120)

## 2022-10-28 NOTE — Telephone Encounter (Signed)
Patients wife called stated they went to the PCP but they did not have any correspondence from dr. Leonides Schanz or anything she has done.

## 2022-10-29 NOTE — Telephone Encounter (Signed)
The patient's notes have been routed to his PCP.

## 2022-10-30 ENCOUNTER — Telehealth: Payer: Self-pay | Admitting: Cardiology

## 2022-10-30 NOTE — Telephone Encounter (Signed)
Wife called to say the patient weight is stable at 152lbs. Please advise

## 2022-10-30 NOTE — Telephone Encounter (Signed)
Spoke with pt who is calling to report his weight has been stable for the past 3 weeks at 152 lbs.  Pt would like to proceed with Watchman.  He is scheduled for a follow up visit on 12/25/2022 and is aware of this appointment date and time. Pt advised will forward information to Dr Lalla Brothers and his nurse to address when they are back in the office.  Pt verbalizes understanding and agrees with current plan.

## 2022-11-04 ENCOUNTER — Ambulatory Visit: Payer: Medicare Other | Admitting: Internal Medicine

## 2022-11-04 NOTE — Telephone Encounter (Signed)
Spoke with the patient's wife who is aware that we will reassess at his appointment.

## 2022-11-06 ENCOUNTER — Ambulatory Visit (INDEPENDENT_AMBULATORY_CARE_PROVIDER_SITE_OTHER): Payer: Medicare Other

## 2022-11-06 DIAGNOSIS — I495 Sick sinus syndrome: Secondary | ICD-10-CM

## 2022-11-06 LAB — CUP PACEART REMOTE DEVICE CHECK
Battery Remaining Longevity: 94 mo
Battery Remaining Percentage: 74 %
Battery Voltage: 3.01 V
Brady Statistic AP VP Percent: 89 %
Brady Statistic AP VS Percent: 1 %
Brady Statistic AS VP Percent: 11 %
Brady Statistic AS VS Percent: 0 %
Brady Statistic RA Percent Paced: 1 %
Brady Statistic RV Percent Paced: 99 %
Date Time Interrogation Session: 20240613020014
Implantable Lead Connection Status: 753985
Implantable Lead Connection Status: 753985
Implantable Lead Implant Date: 20130716
Implantable Lead Implant Date: 20220317
Implantable Lead Location: 753859
Implantable Lead Location: 753860
Implantable Lead Model: 1948
Implantable Lead Model: 5076
Implantable Pulse Generator Implant Date: 20220317
Lead Channel Impedance Value: 400 Ohm
Lead Channel Impedance Value: 610 Ohm
Lead Channel Pacing Threshold Amplitude: 0.375 V
Lead Channel Pacing Threshold Amplitude: 0.5 V
Lead Channel Pacing Threshold Pulse Width: 0.5 ms
Lead Channel Pacing Threshold Pulse Width: 0.5 ms
Lead Channel Sensing Intrinsic Amplitude: 12 mV
Lead Channel Sensing Intrinsic Amplitude: 2.6 mV
Lead Channel Setting Pacing Amplitude: 0.625
Lead Channel Setting Pacing Amplitude: 2 V
Lead Channel Setting Pacing Pulse Width: 0.5 ms
Lead Channel Setting Sensing Sensitivity: 7 mV
Pulse Gen Model: 2272
Pulse Gen Serial Number: 3908614

## 2022-11-06 NOTE — Telephone Encounter (Signed)
Reached out to [employer] but did not get an answer then I spoke to Vici and after she looked into the question she states they have everything they need.

## 2022-11-14 ENCOUNTER — Telehealth: Payer: Self-pay | Admitting: Cardiology

## 2022-11-14 NOTE — Telephone Encounter (Signed)
Pt's wife calling to f/u on paperwork that was dropped off at the office as well a some that was sent via fax or mail by their insurance. Wife states that they will not approve claim until paperwork is filled out and signed. Pease advise

## 2022-11-14 NOTE — Telephone Encounter (Signed)
Spoke with the patient's wife who states that the insurance company is needing another form filled out for their claim for cancelling their trip. She is going to contact the company and have them resend the form as I have not seen anything.

## 2022-11-18 ENCOUNTER — Telehealth: Payer: Self-pay | Admitting: Cardiology

## 2022-11-18 NOTE — Telephone Encounter (Signed)
Calling in about the the prescription that was sent in for patient's Cpap supplies. They sent it in April. Please advise

## 2022-11-20 ENCOUNTER — Encounter: Payer: Self-pay | Admitting: Internal Medicine

## 2022-11-20 ENCOUNTER — Ambulatory Visit (INDEPENDENT_AMBULATORY_CARE_PROVIDER_SITE_OTHER): Payer: Medicare Other | Admitting: Internal Medicine

## 2022-11-20 VITALS — BP 120/60 | HR 60 | Ht 67.0 in | Wt 160.8 lb

## 2022-11-20 DIAGNOSIS — J849 Interstitial pulmonary disease, unspecified: Secondary | ICD-10-CM | POA: Diagnosis not present

## 2022-11-20 DIAGNOSIS — R432 Parageusia: Secondary | ICD-10-CM

## 2022-11-20 DIAGNOSIS — Z77098 Contact with and (suspected) exposure to other hazardous, chiefly nonmedicinal, chemicals: Secondary | ICD-10-CM

## 2022-11-20 DIAGNOSIS — R634 Abnormal weight loss: Secondary | ICD-10-CM

## 2022-11-20 NOTE — Patient Instructions (Addendum)
  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  Hx of covid March 2023 - did not affect your fibrosis.  Stable in the last 2 months despite stopping pirfenidone (due to weight loss and other side effects) .  But noted that side effects of weight loss and loss of taste have all improved after C. difficile treatment and starting ivermectin in June 2024.  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  However and noticed that [facility] rheumatology on 11/18/2022 gave your diagnosis of psoriatic arthritis.  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  However this does not impact current treatment recommendation.   Plan - -Continue monitor without pirfenidone for now -Open to re-starting pirfenidone once ivermectin course is completed  -Please check with the Dr. Mayford Knife and let us know via phone call when ivermectin will be completed -Do spirometry and DLCO in 2-3 months and return for follow-up  Dysgeusia Unintentional weight loss - despite stopping esbriet early April 2024 Fatigue  -Symptoms are all resolving in the last 2 months after stopping pirfenidone but respect your judgment that this could be because of C. difficile or positive treatment effect of ivermectin   Plan - Continue to hold pirfenidone but open to restarting it once ivermectin course is completed -We will also monitor to see if the symptoms happen again once pirfenidone is restarted   Followup - 2-3 months return to see Dr. Marchelle Gearing ; 30-minute visit but after spirometry and DLCO  -Symptom score and exercise hypoxemia test at follow-up visit

## 2022-11-20 NOTE — Progress Notes (Signed)
PCP Cameron Arbour, MD  HPI   IOV 07/10/2017  Chief Complaint  Patient presents with   Advice Only    Referred by [facility] due to SOB.  PFT done 05/28/17.  Pt has been having issues with SOB x4 months especially with exertion and has some mild chest tightness. Denies any cough.    82 year old male referred by Dr. Johney [name] for evaluation of shortness of breath after cardiac etiologies ruled out.  He tells me that he is a remote smoker.  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  He has a long-standing history of rheumatoid arthritis followed by Dr. Gavin [name] in [place].  He is to be on methotrexate for many years and stopped taking it because of cardiac dysfunction [he personally is convinced that methotrexate because this].  He was then on leflunomide as of 2017 but is currently not on it.  His last rheumatoid factor and CCP antibodies were negative on my personal chart review of the outside records in 2016.    Now for the last 3 or 6 months he is got insidious onset of shortness of breath that is slowly progressive.  His dyspnea on exertion relieved by rest.  Class II-3 activities.  There I  s no associated cough or orthopnea proximal nocturnal dyspnea.  He did have some edema but this got cleared up but the dyspnea is continuing to get worse.  In the last few months he has had a cardiac echo that showed pulmonary hypertension.  Did have cardiac stress test that is normal.  Had pulmonary function test that showed isolated reduction in diffusion capacity and therefore he has been referred here.  Walking desaturation test on 07/10/2017 185 feet x 3 laps on ROOM AIR:  did NOT desaturate. Rest pulse ox was 100%, final pulse ox was 98%. HR response 60/min at rest to 121/min at peak exertion. Patient Cameron [name]  Did not Desaturate < 88% . Cameron [name] did not  Desaturated  </= 3% points. Cameron [name] yes did get tachyardic   OV 08/04/2017  Chief Complaint  Patient presents with   Follow-up    ILD    Follow-up interstitial lung disease workup  After the last visit no interim problems.  He has some chronic pedal edema that he will talk to about with his primary care physician.  He did see Dr. Gavin [name] rheumatologist in July 15, 2017.  I reviewed his notes.  It is not specifically indicate patient has nonspecific seronegative arthritis with a differential diagnosis of seronegative rheumatoid arthritis versus psoriatic.  Patient still seems to think the root of all his problems is the methotrexate he took for over 10 years.  At this point in time there is no decompensation.  As part of the ILD workup his pulmonary function test shows mild reduction in diffusion capacity.  Correlating with this is evidence of pulmonary hypertension on the echo and high-resolution CT chest enlarged pulmonary arteries.  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  His autoimmune panel is negative.  And so the vasculitis panel and hypersensitivity pneumonitis panel.    IMPRESSION: 1. 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 Early/mild usual interstitial pneumonitis is not excluded. 2. Aortic atherosclerosis (ICD10-170.0). Coronary artery calcification. 3. Enlarged pulmonary arteries, indicative of pulmonary arterial Hypertension.- > in echo  Nov 2018: Pulmonary arteries: Systolic pressure was moderately increased.   PA peak pressure: 55 mm Hg (S). 4. Left renal stone, partially imaged.     Electronically Signed   By: Cameron [name] M.D.   On: 07/22/2017 15:03  OV 11/03/2017  Chief Complaint  Patient presents with   Follow-up    Pt has SOB with  exertion, some dry cough.     Follow-up suspected interstitial lung disease in the setting of rheumatoid arthritis and long methotrexate intake  He presents with his wife.  At the time of last visit I was not fully convinced that he had interstitial lung disease.  His CT scan indicated presence of pulmonary hypertension and so did his echocardiogram.  Therefore I referred him back to Dr. Johney [name] cardiology.  In the spring 2019 he did have a right heart catheterization and left heart catheterization that showed mild pulmonary hypertension but also coronary artery blockage.  He status post 2 stents.  After that his shortness of breath improved but he tells me overall his fatigue level has not improved.  In talking to him I find out that he exercises 5 times a week doing heart track 2 times a week and the other 3 days walking a mile each time and doing weight lifting.  It appears that he takes a 20-minute nap after these exercises and then feels reenergized.  His mother feels that he does not have the effort tolerance as his younger days but he denies having any symptoms of chest pain or shortness of breath or cough when he does these heavy exertion the weight lifting or walking a mile out doing heart track.  In fact in the walking desaturation test today he walked extremely fast and had no problems.  In terms of his possible interstitial lung disease he had pulmonary function test today and felt to show some improvement and on exam he does not have any crackles.  Also his wife tells me that for the last 2 months he is using CPAP for sleep apnea and this is also helping him.  Right Heart Pressures R[facility] Procedural Findings: Hemodynamics (mmHg) RA mean 2 RV 42/6 PA 42/8, mean 21 PCWP mean 7 LV 134/8 AO 147/52       OV 06/01/2019  Subjective:  [name], male , DOB: 1941-01-06 , age 82 y.o. , MRN: [id number] , ADDRESS: [phone] [place] [address]   06/01/2019 -   Chief  Complaint  Patient presents with   Follow-up    Pt states he has been doing okay since last visit but states he has been having a little more SOB x4 weeks now. Pt also has occ coughing with yellow phlegm.   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 -returns for follow-up.  I personally have not seen him since the summer 2019.  He says overall he has been stable.  In August 2020 he had a CT scan of the chest that confirmed the presence of interstitial lung disease in the setting of his rheumatoid arthritis.  He was stable.  His pulmonary function test was stable.  He says now in the last 2 months has had a decline in shortness of breath.  There  is also some cough with sputum production but that has resolved.  It is definitely present with exertion but relieved by rest.  His walking desaturation test compared to 18 months ago is roughly the same except that he is very tachycardic.  He does have a pacemaker.  He has an appoint with Dr. Johney [name] his electrophysiologist today.  His symptom scores are listed below.     ROS - per HPI     OV 08/01/2019 - telephine visit - identified with 2 person identifier. Telephone visit - limits, risks benefits explained  Subjective:  [name], male , DOB: 1940/11/03 , age 24 y.o. , MRN: [id number] , ADDRESS: [phone] 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 - similar dyspnea compared to [facility] 2021.  No better nor worse.  After last visit he has seen cardiology x2.  It appears the final conclusion is that diastolic dysfunction might be contributing to his shortness of breath.  But overall not major changes in his cardiac care.  He tells me that overall he is stable.  He had spirometry and DLCO the DLCO itself appears to be  reduced compared to the recent 1 but stable compared to older ones.  The FVC suggest decline.  Patient himself feels stable.  Overall some mixed picture.  Last high-resolution CT chest was October 2020.    IMPRESSION: 1. 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 There is no significant air trapping on expiratory phase imaging. Findings are not significantly changed compared to prior examinations and remain in an "indeterminate for UIP" pattern by ATS pulmonary fibrosis criteria, differential considerations including both UIP and NSIP.   2.  Coronary artery disease and aortic atherosclerosis.   3.  Left nephrolithiasis.     Electronically Signed   By: Lauralyn Primes M.D.   On: 01/12/2019 15:08    OV 10/17/2019  Subjective:  [name], male , DOB: February 16, 1941 , age 56 y.o. , MRN: [id number] , ADDRESS: [phone] [place] [address]   10/17/2019 -   Chief Complaint  Patient presents with   Follow-up    pt states sobwhen doing activities.   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 -presents for follow-up after having his spirometry and DLCO and high-resolution CT chest.  Overall he feels stable compared to the last visit but he says definitely compared to 2 years ago his symptoms are worse.  Compared to 1 year ago his symptoms are the same.  He is kind of leery of taking new medications.  His high-resolution CT scan of the chest indicates mild progression since 2019 February.  His pulmonary function tests also indicate progression compared to 2 years ago but fluctuant in more recent times.  He is reporting agent orange exposure and is  wondering if his ILD could be related to that.  He reminded me that he is already on the ILD-pro registry study.  His next scheduled visit is in October 2021.   High-resolution CT chest May 2021 Lungs/Pleura: Peripheral and basilar predominant subpleural interlobular and intra lobular  septal thickening and ground-glass. Findings persist on prone imaging and appear similar to 01/12/2019 but may be minimally progressive from 07/22/2017. 4 mm peripheral left lower lobe nodule (14/101), stable from 07/22/2017 and considered benign. Lungs are otherwise clear. No pleural fluid. Airway is unremarkable. Mild air trapping.   Upper Abdomen: Visualized portions of the liver and adrenal glands are unremarkable. Stones are seen in the kidneys bilaterally. Spleen and visualized portions of the pancreas, stomach and bowel are grossly unremarkable. Cholecystectomy. No upper abdominal adenopathy.   Musculoskeletal: Degenerative changes in the spine. No worrisome lytic or sclerotic lesions.   IMPRESSION: 1. Pulmonary parenchymal pattern of fibrosis appears grossly stable from 01/12/2019 but may be minimally progressive from 07/22/2017. 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 Usual interstitial pneumonitis is certainly not excluded. Findings are indeterminate for UIP per consensus guidelines: Diagnosis of Idiopathic Pulmonary Fibrosis: An Official ATS/ERS/JRS/ALAT Clinical Practice Guideline. Am Rosezetta Schlatter Crit Care Med Vol 198, Iss 5, [id number], Jan 24 2017. 2. Bilateral renal stones. 3. Aortic atherosclerosis (ICD10-I70.0). Coronary artery calcification. 4. Enlarged pulmonic trunk, indicative of pulmonary arterial hypertension.     Electronically Signed   By: Cameron [name] M.D.   On: 10/10/2019 14:00  ROS - per HPI     OV 06/29/2020  Subjective:  [name], male , DOB: [birth year] , age 2 y.o. , MRN: [id number] , ADDRESS: [address] [place] [address] PCP Cameron Arbour, MD Patient Care Team: Cameron Arbour, MD as PCP - General (Unknown Physician Specialty) Hillis Range, MD as PCP - Electrophysiology (Cardiology) Lennette Bihari, MD as PCP - Cardiology (Cardiology) Quintella Reichert, MD as PCP - Sleep Medicine (Cardiology)  This Provider for this visit: Treatment Team:  Attending Provider: Kalman Shan, MD    06/29/2020 -   Chief Complaint  Patient presents with   Follow-up    SOB unchanged, slight nonproductive cough. Esbriet doing well.   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 Normal echo Jan 2021   HPI Cameron [name] 82 y.o. -returns for follow-up.  He presents with his wife.  Last seen in May 2021.  After that in June 2021 when he started pirfenidone for progressive ILD.  He tells me that he has been tolerating pirfenidone just fine.  Of note he has not had any liver function test since he started pirfenidone.  He is not having any GI side effects of skin side effects from the pirfenidone.  In the last 6 months his ILD stable according to his history.  His walking desaturation test and symptom scores are stable.  He is up-to-date with his Covid vaccine.  He is on leflunomide.  He is on the ILD-pro registry study with a last visit was in November 2021.  He needs a visit every 6 months.  He is willing to get a Covid IgG checked because he is immunosuppressed.  This is in response to humoral immunity to vaccine     OV 04/09/2021  Subjective:  [name], male , DOB: 1941/05/24 , age 86 y.o. , MRN: [id number] , ADDRESS: [address] 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 Normal echo Jan 2021  HPI Cameron [name] 82 y.o. -returns for follow-up.  He tells me that in the last 6 months since his last visit he is having progressively more shortness of breath with exertion relieved by rest.  In fact his dyspnea score shows worsening to a total score of 9 compared to earlier in the year.  But there is no worsening cough.  He had pulmonary function test that shows continued stability since 2021 feb but definitely worse since 2019 and 2020.  In other words the stability correlates with him starting pirfenidone.  Explained to him that the pirfenidone is helping his ILD stability.  Nevertheless his symptoms are worse.  He had echocardiogram yesterday that shows continued elevation in pulmonary artery pressures [2019 borderline elevation] and grade 2 diastolic dysfunction.  Of note he had his ILD-Pro registry research visit today. Last liver function test for drug-induced liver injury monitoring was in February 2022.  GFR at that time was 57.  In March 2022.  His wife has suspected ILD and she is also with him in this visit.  I saw her for a scheduled visit just earlier.      OV  06/13/2021  Subjective:  [name], male , DOB: June 15, 1940 , age 2 y.o. , MRN: [id number] , ADDRESS: [place] [address] PCP [name], MD Patient Care Team: [name], MD as PCP - General (Unknown Physician Specialty) [name], MD as PCP - Electrophysiology (Cardiology) Troy Sine, MD as PCP - Cardiology (Cardiology) Sueanne Margarita, MD as PCP - Sleep Medicine (Cardiology)  This Provider for this visit: Treatment Team:  Attending Provider: Brand Males, MD    06/13/2021 -   Chief Complaint  Patient presents with   Follow-up    Pt is following up after recent heart cath.    HPI Cameron [name] 82 y.o. -returns for follow-up to discuss the implications of the right heart cath results.  He had right heart cath on 05/29/2021.  Around this time he also had some mild anemia that is documented below.  But his recent creatinine is normal.  His right heart cath shows worsening of mean arterial pressure compared to a few years ago but his PVR is low and his wedge is high.  He also has moderate mitral regurgitation and had a high V wave.  Compared to last visit he stable but over time he said worsening dyspnea.  He is here with his wife.  I told them both that this looks like pulmonary venous hypertension.  He does not have edema.  We discussed the possibility of doing Lasix  therapy.  I told him he does not meet official criteria for inhaled treprostinil and WHO group 3 pulmonary hypertension.  For inhaled treprostinil he would need a wedge less than 15 and a PVR greater than 3.  He does not have that.  He is agreed to do Lasix at this point.  He will have to follow-up with Dr. [name] for this.  In terms of his interstitial lung disease he has a surveillance CT scan coming up in March 2023 [last CT was then May 2021].  I think we will postpone this and just see him in 4 to 6 months for ILD follow-up given recent stability and  pulmonary function test.  What he needs is follow-up for his shortness of breath and to see how the Lasix is performing.  For this we will make a nurse practitioner visit and also encouraged him to go and see Dr. Claiborne Billings.  CT Chest data  No results found.   07/11/2021: Today - follow up Patient presents today with wife via virtual visit for follow up after being started on lasix for mild pulmonary HTN. He states he started the medication but has since stopped due to some bladder issues. He reports his breathing has been stable and overall, he feels pretty well. He denies any lower extremity swelling, palpitations, orthopnea, PND, cough or wheezing. He is doing well on Esbriet. He has developed a rash over the last 3-4 days after being transitioned from metformin to [place]. He denies any facial or tongue swelling, difficulties breathing or swallowing, or N/V.     OV 10/01/2021  Subjective:  [name], male , DOB: 1941/04/07 , age 82 y.o. , MRN: [id number] , ADDRESS: [place] [address] PCP [name], MD Patient Care Team: [name], MD as PCP - General (Unknown Physician Specialty) [name], MD as PCP - Electrophysiology (Cardiology) Troy Sine, MD as PCP - Cardiology (Cardiology) Sueanne Margarita, MD as PCP - Sleep Medicine (Cardiology)  This Provider for this visit: Treatment Team:  Attending Provider: Brand Males, MD    10/01/2021 -   Chief Complaint  Patient presents with   Follow-up    PFT performed today.  Pt states he has been doing good since last visit and denies any complaints.    HPI Cameron [name] 82 y.o. -returns for routine follow-up.  After I saw him in January 2023 we gave him a trial of Lasix because of diastolic dysfunction and pulmonary venous hypertension but this did not help.  He denied acute video visit in March 2023 because of COVID-19.  He took antiviral.  Currently doing well except for  the fact that he has had increased fatigue compared to baseline [the symptom score].  The fatigue is significant especially by the afternoon.  Also for the last few weeks he has had diarrhea that is moderate in intensity.  There is no changes in his medications.  Over time his creatinine has gotten worse.  He does have chronic kidney disease and is seen his primary care.  He also tells me that he was taking potassium and recently has had hyperkalemia.  This was getting worse as of yesterday with a potassium of 6.1 mEq.  This was at outside facility and I was able to review the lab results.  His primary care is intently watching this.  He is off his diuretics and potassium supplementation.  I have instructed him to have  repeat potassium today.  In terms of his ILD it is stable.  Pulmonary function tests are stabilized.  His CT scan of the chest was reviewed and this shows a stable ILD in the last 2 years.  Unclear to me if he is still on leflunomide or not. He is supposed have a research registry follow-up visit today.  Creatinine profile shows a slow worsening over time.          OV 11/27/2021  Subjective:  [name], male , DOB: Nov 18, 1940 , age 56 y.o. , MRN: [id number] , ADDRESS: [place] [address] PCP [name], MD Patient Care Team: [name], MD as PCP - General (Unknown Physician Specialty) [name], MD as PCP - Electrophysiology (Cardiology) Troy Sine, MD as PCP - Cardiology (Cardiology) Sueanne Margarita, MD as PCP - Sleep Medicine (Cardiology)  This Provider for this visit: Treatment Team:  Attending Provider: Brand Males, MD    11/27/2021 -   Chief Complaint  Patient presents with   Follow-up    Pt to discuss Esbriet. Pt still doing good since LOV.    HPI Cameron [name] 82 y.o. -returns for follow-up.  At the last visit in May 2023 because of rising creatinine we reduce his pirfenidone dose  particularly in the setting of fatigue.  It seems lowering the pirfenidone dose does not really help the fatigue but it does seem to overall make him feel better.  He is stable.  His dyspnea is better.  His diarrhea is very minimal to nonexistent.  He had pulmonary function test and this shows continued stability since being on pirfenidone.  He had ILD-Pro registry visit in the spring 2023.  His next visit is due sometime around October 2023.  We discussed his rising creatinine.  I advised him to stay on the lower dose of the pirfenidone.  This would be 2 pills 3 times daily.  He did indicate and his wife also indicated that he is having difficult time controlling his hyperkalemia.  Review of his medication shows that he is on ARB s [telmisartan].  Discussed this is a potential contributory etiology for his hyperkalemia.  He is willing to get his creatinine and his potassium checked today in the office.  But his primary care is the one who is managing that.   Noted he is on fish oil but he says this actually helps him with his arthritis.     OV 05/06/2022  Subjective:  [name], male , DOB: [birth year] , age 33 y.o. , MRN: [id number] , ADDRESS: [place] [id number] PCP [name], MD Patient Care Team: [name], MD as PCP - General (Unknown Physician Specialty) [name], MD as PCP - Electrophysiology (Cardiology) Troy Sine, MD as PCP - Cardiology (Cardiology) Sueanne Margarita, MD as PCP - Sleep Medicine (Cardiology) [place], Tami Lin, [place] as Physician Assistant (Cardiology)  This Provider for this visit: Treatment Team:  Attending Provider: Brand Males, MD    05/06/2022 -   Chief Complaint  Patient presents with   Follow-up    Pt states he has been doing okay since last visit and denies any complaints.     Marland Kitchen  HPI Cameron [name] 81 y.o. -returns for follow-up.  Presents with his wife.  He feels he is doing  stable.  Symptom score shows stability but to me he obviously looks  like he has lost weight.  He then told me that ever since COVID earlier in the year he is having dysgeusia and low appetite and therefore he is losing weight.  Review of the records indicate he is lost 20 pounds in the last 1 year.  I did indicate to him that concern of COVID causing his dysgeusia is pirfenidone could be causing his dysgeusia and low appetite and weight loss.  He is willing to give a short holiday and reassess.  He has not had pulmonary function test recently but he feels stable.  He is due for monitoring safety labs with his pirfenidone high risk medication.  He continues on immunosuppression.      OV 08/28/2022  Subjective:  [name], male , DOB: [birth year] , age 19 y.o. , MRN: [id number] , ADDRESS: [address] [place] [address] PCP Cameron Arbour, MD Patient Care Team: Cameron Arbour, MD as PCP - General (Unknown Physician Specialty) Hillis Range, MD (Inactive) as PCP - Electrophysiology (Cardiology) Lennette Bihari, MD as PCP - Cardiology (Cardiology) Quintella Reichert, MD as PCP - Sleep Medicine (Cardiology) [facility], Roe Rutherford, PA as Physician Assistant (Cardiology)  This Provider for this visit: Treatment Team:  Attending Provider: Kalman Shan, MD    08/28/2022 -   Chief Complaint  Patient presents with   Follow-up    Fatigue, sob  HPI Cameron [name] 82 y.o. -last seen in December 2023.  At that time he was having dysgeusia after COVID.  Also having weight loss.  We do not know if pirfenidone was involved so told him to stop it for a few weeks which she did but it did not really resolve his symptoms.  He presents for follow-up but it appears in January 2024 [actually between 05/23/2022 - 05/30/2022] he was admitted with melena and upper GI bleed got 3 units of blood.  Extensive endoscopy colonoscopy and camera test were all negative.  Since then they have been  monitoring his hemoglobin at home and it is improved.  He did follow-up with Dr. Sherin Quarry on 07/01/2022 at GI clinic.  He states ever since then he feels a lot more fatigue despite improvement in hemoglobin he is more short of breath.  His mood is also low.  He has been started by the [facility] yesterday/3/24 and Zoloft but he denies being sad.  Although depression has been diagnosed.  Definitely not suicidal.  He is unable to understand his fatigue.  Wife also affirms the same.  Review of his labs included 07/01/2022 low vitamin D but is not on supplements and abnormal TSH in 2018 which she believes has not been rechecked.  He believes he is lost weight but according to our indicators his weight is stable  His last echocardiogram was in November 2022 And his last high-resolution CT chest was in May 2023   OV 09/25/2022  Subjective:  [name], male , DOB: January 04, 1941 , age 27 y.o. , MRN: [id number] , ADDRESS: [address] [place] [address] PCP Cameron Arbour, MD Patient Care Team: Cameron Arbour, MD as PCP - General (Unknown Physician Specialty) Hillis Range, MD (Inactive) as PCP - Electrophysiology (Cardiology) Lennette Bihari, MD as PCP - Cardiology (Cardiology) Quintella Reichert, MD as PCP - Sleep Medicine (Cardiology) [facility], Roe Rutherford, PA as Physician Assistant (Cardiology)  This Provider for this visit: Treatment Team:  Attending Provider: Kalman Shan,  MD    09/25/2022 -   Chief Complaint  Patient presents with   Follow-up    F/up prescription, weight loss     HPI Cameron [name] 82 y.o. -returns for follow-up.  I saw him a month ago for his weight loss and dysgeusia.  We had him stop pirfenidone.  He stopped it he is not taking it for the last 1 month.  His wife is here with him and she is independent historian.  They are alarmed of the weight loss there is more weight loss is at least 20 pounds of weight loss in the last 1 month.   Yesterday was started on Aldactone for his new onset heart failure which we diagnosed after his last visit.  He saw Dr. Nicki Guadalajara for that.  He is also got an iron infusion yesterday for his unexplained anemia.  Review of medication shows that he is on Areva which can cause weight loss.  He temporarily stopped it 2 weeks ago but restarted it 2 days ago.  He restarted it because his arms were hurting.  He is also on Jardiance for the last 2 years and this correlates with the time.  his weight loss although he was on pirfenidone at the same time but the fact that he is losing weight despite coming off pirfenidone.  Did simple exercise hypoxemia test.  I made him do a sit/stand test for ten reps.  He did not desaturate   Last Weight  Most recent update: 09/25/2022  1:03 PM    Weight  71.8 kg (158 lb 6.4 oz)                OV 11/20/2022  Subjective:  [name], male , DOB: 01-Jun-1940 , age 21 y.o. , MRN: [id number] , ADDRESS: [address] [place] [address] PCP Cameron Arbour, MD Patient Care Team: Cameron Arbour, MD as PCP - General (Unknown Physician Specialty) Lennette Bihari, MD as PCP - Cardiology (Cardiology) Quintella Reichert, MD as PCP - Sleep Medicine (Cardiology) Lanier Prude, MD as PCP - Electrophysiology (Cardiology) [facility], Roe Rutherford, PA as Physician Assistant (Cardiology)  This Provider for this visit: Treatment Team:  Attending Provider: Kalman Shan, MD  Arthritis - > do [facility] rheumatology -2019 notes at [facility] clinic suggest seronegative rheumatoid arthritis ` -11/18/2022 given a diagnosis of psoriatic arthritis by Dr. 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 Pn med list May 2023  = Started pirfenidone June 2021 -> held April 2024 secondary to weight loss and dysgeusia and fatigue.  - ILD-Pro registry stdy  - last visit 04/09/2021  - Last HRCT May 2021 -. May 2023 without change (indeterminate)   RHC 05/29/21 - Mild pulmonary hypertension in this patient with interstitial lung disease with elevated RV 54/12; PA pressure 55/18; mean 31 mmHg.  PVR 2.6 WU. PW mean pressure is 17 mmHg, with V wave 24 c/w his echo documented mild - moderate mitral regurgitation.   CKD -  -07/13/2019: Creatinine 1.2 mg percent -05/23/2021 creatinine 1.15 mg percent  - 06/13/21 creat 1.6mg %, GFR 39 - 09/30/21 creat 1.36m GFR 42 - 10/16/21 - creat 1.84mg % - GFR 34 -11/27/2021: Creatinine 1.31 mg percent\\ - 05/30/22 -  cret 0.95mg %  Mild COVID early 2023  Admission for blood loss anemia -without identifiable source in January 2024 status post 3 units of blood  -Started iron infusion Sep 24, 2022 with Dr. Orlie Dakin  -Hemoglobin 11.7 g% 08/31/2022.   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 Nicki Guadalajara Sep 24, 2022  Norovirus infection 2018 Incidental/atypical C. difficile with high calprotectin -.  In May 2024   Unintentional weight loss as below   - in symptom chart    11/20/2022 -   Chief Complaint  Patient presents with   Follow-up    F/up, no complaints.     HPI Cameron [name] 82 y.o. -returns for follow-up.  Presents with his wife wife is an independent historian.  She gives most of the history.  But they both aligned at the information.   #Symptoms of weight loss, dysgeusia and fatigue  He is now off pirfenidone since early April 2024.  He states that his taste is improved.  He also tells me that his weight loss is resolving [see below and it actually is resolving.].  His fatigue is  also improving.  The temporal events related to his improvement in fatigue and weight weight loss and also loss of taste is the following   -Holding pirfenidone since early April 2024  -Diagnosis of systolic heart failure on the echocardiogram April 2021 starting Aldactone by Dr. Nicki Guadalajara   -Saw primary care Aram Beecham and review of the records indicate 10/29/2022 he had C. difficile toxin positivity on stool test.  In fact even before this on GI visit 10/16/2022 with Dr. Leonides Schanz she noticed that outside he had a test called GI 360 with life extension which showed positive C. difficile toxin and high fecal calprotectin.  He took Dificid course  -Also during this time he has seen 1 Dr. Estil Daft in [place] was an MD but practices homeopathy.  2 weeks ago he was started on ivermectin and he feels within 2 days his symptoms started improving.  -Of all these measures he feels ivermectin is the one that helped him the most.   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  In fact symptom scores are stable.  Excess hypoxemia test is stable.  He has not had pulmonary function test today at this visit.  The stability is compared to the last visit.  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  This because when I first met him his chart review at [facility] rheumatology could not in clinic suggested he had seronegative rheumatoid arthritis.  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  This diagnosis was given at sometime in the past.  I do not know about this.  He is on Areva for this.  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  However there have been no impact to  the management.      SYMPTOM SCALE - ILD 10/17/2019 Started pirfenidone June 2021 06/29/2020  04/09/2021 193# 10/01/2021 Esbriet lower dose since may 2023  186# 05/06/2022 Esbiret lowe rdose due to CKD  173# 08/28/2022 177# Esbiret low dose 09/25/2022 158# Off esbriet since April 2024 visit xxxxx 11/20/2022 160# - s/ p C diff Rx end/may 2024 and IVermetcin starting mide June 2024  O2 use ra ra ra ra ra ra ra  Shortness of Breath 0 -> 5 scale with 5 being worst (score 6 If unable to do)        At rest 0 0 0 0 1 2 0  Simple tasks - showers, clothes change, eating, shaving 1 1 0 0 0 2 0  Household (dishes, doing bed, laundry) 1 0 0 0 0 2 0  Shopping 0 0 1 0 1 2 0  Walking level at own pace 2 0 4 0 2 3 0  Walking up Stairs 3 3 4 3 3 4  2.5  Total (30-36) Dyspnea Score 7 4 9 3 7 15  2.5  How bad is your cough? x 1  0 2 0 0  How bad is your fatigue 4 2 2 4 2 5 3   How bad is nausea 0 0 0 0 0 2 0  How bad is vomiting?  0 0 0 0 0 0 0  How bad is diarrhea? 1 0 0 0.5 0 5 0  How bad is anxiety? 1 1 1 2 1 2 2   How bad is depression 1 1  1 1 1 4 2         Simple office walk 185 feet x  3 laps goal with forehead probe 11/03/2017  06/01/2019  06/29/2020  04/09/2021  10/01/2021  09/25/2022  11/20/2022   O2 used Room air Room air  ra ra ra ra  Number laps completed 3 3  3      Comments about pace Fast very  99% and 66    Sit stand 10 tmes Sit stand x 15 tmes  Resting Pulse Ox/HR 100% and 70/min 99% and 66/min 97% and 71 100% and Pulse 77 100% and  HR 67 98% and HR 53 98% and HR 63  Final Pulse Ox/HR 98% and 121/min 98% and 120 min 98% and 121/min 99% and HR 114 97% and HR 112 98% and HR 110 97% an dHR 115  Desaturated </= 88% no    no    Desaturated <= 3% points no    Yes 3 pnts    Got Tachycardic >/= 90/min yes    yes    Symptoms at end of test none Mild dyspea No dyspnea Dyspnea at end Noe copmpaint Mild ydpnes   Miscellaneous comments veryu fast Mod pace fast Fast pace moderate        PFT     Latest Ref Rng & Units 08/25/2022   10:58 AM 10/01/2021   10:03 AM 04/17/2020    9:34 AM 10/13/2019    3:53 PM 07/21/2019    3:53 PM 01/17/2019   12:39 PM 11/03/2017    2:10 PM  PFT Results  FVC-Pre L 2.80  3.27  3.05  3.08  2.92  3.41  3.59   FVC-Predicted Pre % 81  93  86  86  81  94  98   Pre FEV1/FVC % % 84  84  81  81  82  82  83   FEV1-Pre L 2.34  2.75  2.46  2.49  2.40  2.78  2.98   FEV1-Predicted Pre % 97  112  98  98  94  107  114   DLCO uncorrected ml/min/mmHg 20.12  18.84  19.38  19.58  20.08  21.58  20.97   DLCO UNC% % 91  85  87  87  89  96  74   DLCO corrected ml/min/mmHg 22.73  18.63  19.38  19.58  20.08     DLCO COR %Predicted % 103  84  87  87  89     DLVA Predicted % 141  92  105  101  112  104  89        has a past medical history of Anemia, Anxiety (self recent), Appendicitis, Atrial fibrillation (HCC) (12/12/2013), Bone cancer (HCC) (father  5), BPH (benign prostatic hypertrophy), CAD (coronary artery disease) (12/2015), Chest pain (12/03/2015), CHF (congestive heart failure) (HCC), Complete heart block (HCC), Coronary artery disease, Coronary artery disease involving native coronary artery of native heart with unstable angina pectoris (HCC) (08/11/2017), D-dimer, elevated (04/03/2017), Depression (self recent), Drug reaction (07/11/2021), GERD (gastroesophageal reflux disease), History of blood transfusion (1968), History of kidney stones, History of SCC (squamous cell carcinoma) of skin (07/24/2020), Hyperglycemia (11/05/2013), Hyperlipidemia (11/05/2013), Hypertension, Hypothyroidism, Hypothyroidism, unspecified (11/05/2013), 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   reports that he  quit smoking about 50 years ago. His smoking use included cigarettes. He has a 5.00 pack-year smoking history. He quit smokeless tobacco use about 49 years ago.  His smokeless tobacco use included chew.  Past Surgical History:  Procedure Laterality Date   BACK SURGERY     BALLOON DILATION N/A 05/24/2022   Procedure: BALLOON DILATION;  Surgeon: Imogene Burn, MD;  Location: [facility] ENDOSCOPY;  Service: Gastroenterology;  Laterality: N/A;   CARDIAC CATHETERIZATION N/A 12/28/2015   Procedure: Left Heart Cath and Coronary Angiography;  Surgeon: Lyn Records, MD;  Location: [facility] INVASIVE CV LAB;  Service: Cardiovascular;  Laterality: N/A;   CATARACT EXTRACTION W/ INTRAOCULAR LENS  IMPLANT, BILATERAL Bilateral    COLONOSCOPY WITH PROPOFOL N/A 05/25/2022   Procedure: COLONOSCOPY WITH PROPOFOL;  Surgeon: Imogene Burn, MD;  Location: [facility] ENDOSCOPY;  Service: Gastroenterology;  Laterality: N/A;   CORONARY ANGIOPLASTY WITH STENT PLACEMENT  08/11/2017   "2 stents"   CORONARY STENT INTERVENTION N/A 08/11/2017   Procedure: CORONARY STENT INTERVENTION;  Surgeon: Lennette Bihari, MD;  Location: [facility] INVASIVE CV LAB;  Service: Cardiovascular;  Laterality: N/A;   CYSTOSCOPY W/ STONE MANIPULATION     ESOPHAGOGASTRODUODENOSCOPY (EGD) WITH PROPOFOL N/A 05/24/2022   Procedure: ESOPHAGOGASTRODUODENOSCOPY (EGD) WITH PROPOFOL;  Surgeon: Imogene Burn, MD;  Location: [facility] ENDOSCOPY;  Service: Gastroenterology;  Laterality: N/A;   GIVENS CAPSULE STUDY N/A 05/25/2022   Procedure: GIVENS CAPSULE STUDY;  Surgeon: Imogene Burn, MD;  Location: [facility] ENDOSCOPY;  Service: Gastroenterology;  Laterality: N/A;   INGUINAL HERNIA REPAIR Left    INSERT / REPLACE / REMOVE PACEMAKER  12/09/2011   SJM Acc[employer] DR RF implanted by DR Allred for complete heart block and syncope   JOINT REPLACEMENT     LAPAROSCOPIC CHOLECYSTECTOMY     LEAD REVISION/REPAIR N/A 08/09/2020   Procedure: LEAD REVISION/REPAIR;  Surgeon: Hillis Range, MD;  Location:  [facility] INVASIVE CV LAB;  Service: Cardiovascular;  Laterality: N/A;   LITHOTRIPSY     LUMBAR DISC SURGERY     "removed arthritis and spurs"  PERMANENT PACEMAKER INSERTION N/A 12/09/2011   Procedure: PERMANENT PACEMAKER INSERTION;  Surgeon: Hillis Range, MD;  Location: [facility] CATH LAB;  Service: Cardiovascular;  Laterality: N/A;   PPM GENERATOR CHANGEOUT N/A 08/09/2020   Procedure: PPM GENERATOR CHANGEOUT;  Surgeon: Hillis Range, MD;  Location: [facility] INVASIVE CV LAB;  Service: Cardiovascular;  Laterality: N/A;   PROSTATE SURGERY     REPLACEMENT TOTAL KNEE Right    RIGHT HEART CATH N/A 05/29/2021   Procedure: RIGHT HEART CATH;  Surgeon: Lennette Bihari, MD;  Location: [facility] INVASIVE CV LAB;  Service: Cardiovascular;  Laterality: N/A;   RIGHT/LEFT HEART CATH AND CORONARY ANGIOGRAPHY N/A 08/11/2017   Procedure: RIGHT/LEFT HEART CATH AND CORONARY ANGIOGRAPHY;  Surgeon: Laurey Morale, MD;  Location: [facility] INVASIVE CV LAB;  Service: Cardiovascular;  Laterality: N/A;   TRANSURETHRAL RESECTION OF PROSTATE  2017/2018    Allergies  Allergen Reactions   Ace Inhibitors Swelling    Tongue swelling, angioedema   Lisinopril     Other reaction(s): Lip swelling, O/E - lip swelling   Celecoxib Rash    Skin rash     Immunization History  Administered Date(s) Administered   Fluad Quad(high Dose 65+) 02/11/2020   Influenza Inj Mdck Quad Pf 03/25/2021   Influenza, High Dose Seasonal PF 03/26/2017, 02/11/2022   Influenza-Unspecified 05/03/2015, 02/13/2016, 03/26/2016, 03/19/2017, 03/03/2018, 03/17/2019, 04/09/2020, 04/18/2021   PFIZER Comirnaty(Gray Top)Covid-19 Tri-Sucrose Vaccine 06/11/2019, 07/02/2019, 07/04/2019, 07/18/2019, 03/01/2020, 11/15/2020   PFIZER(Purple Top)SARS-COV-2 Vaccination 06/11/2019, 07/04/2019, 07/18/2019, 03/01/2020   Pneumococcal Conjugate-13 02/11/2020   Pneumococcal Polysaccharide-23 12/10/2011, 01/30/2018   Pneumococcal-Unspecified 03/26/2016, 02/04/2018   Tdap 01/17/2016, 02/04/2018    Zoster Recombinat (Shingrix) 08/14/2020, 11/29/2020    Family History  Problem Relation Age of Onset   Pancreatic cancer Mother    Cancer Mother    Bone cancer Father    Cancer Father    Alzheimer's disease Sister    Heart attack Paternal Uncle    Arthritis Maternal Grandfather    Alzheimer's disease Paternal Grandmother    Lung cancer Paternal Grandfather      Current Outpatient Medications:    acetaminophen (TYLENOL) 500 MG tablet, Take 500 mg by mouth every 6 (six) hours as needed for moderate pain., Disp: , Rfl:    allopurinol (ZYLOPRIM) 300 MG tablet, Take 300 mg by mouth daily., Disp: , Rfl:    apixaban (ELIQUIS) 2.5 MG TABS tablet, Take by mouth 2 (two) times daily., Disp: , Rfl:    BERBERINE CHLORIDE PO, Take 1,500 mg by mouth., Disp: , Rfl:    JARDIANCE 10 MG TABS tablet, Take 10 mg by mouth daily., Disp: , Rfl:    leflunomide (ARAVA) 10 MG tablet, Take 1 tablet by mouth daily., Disp: , Rfl:    megestrol (MEGACE) 20 MG tablet, Take 20 mg by mouth daily., Disp: , Rfl:    nebivolol (BYSTOLIC) 5 MG tablet, Take 5 mg by mouth daily., Disp: , Rfl:    nitroGLYCERIN (NITROSTAT) 0.4 MG SL tablet, Place 1 tablet (0.4 mg total) under the tongue every 5 (five) minutes as needed for chest pain., Disp: 75 tablet, Rfl: 2   NON FORMULARY, 500 mg. Beef liver, Disp: , Rfl:    Omega-3 Fatty Acids (OMEGA-3 FISH OIL PO), Take 1 capsule by mouth daily., Disp: , Rfl:    pravastatin (PRAVACHOL) 20 MG tablet, Take 1 tablet (20 mg total) by mouth every evening., Disp: 90 tablet, Rfl: 3   spironolactone (ALDACTONE) 25 MG tablet, Take 1/2 tablet ( 12.5 mg ) daily,  Disp: 45 tablet, Rfl: 3   telmisartan (MICARDIS) 80 MG tablet, Take 1 tablet (80 mg total) by mouth daily., Disp: 90 tablet, Rfl: 3   thyroid (ARMOUR) 90 MG tablet, Take 90 mg by mouth every morning. , Disp: , Rfl:    triamcinolone ointment (KENALOG) 0.5 %, Apply 1 Application topically 2 (two) times daily. (Patient taking differently:  Apply 1 Application topically as needed (rashes).), Disp: 30 g, Rfl: 0   Trospium Chloride 60 MG CP24, TAKE 1 CAPSULE BY MOUTH DAILY (Patient taking differently: Take 1 capsule by mouth daily.), Disp: 30 capsule, Rfl: 2   TURMERIC PO, Take 720 mg by mouth., Disp: , Rfl:    VTAMA 1 % CREA, APPLY 1 APPLICATION TOPICALLY DAILY, Disp: 60 g, Rfl: 5   colchicine 0.6 MG tablet, Take 0.6 mg by mouth daily. (Patient not taking: Reported on 11/20/2022), Disp: , Rfl:    fidaxomicin (DIFICID) 200 MG TABS tablet, Take 1 tablet (200 mg total) by mouth 2 (two) times daily. (Patient not taking: Reported on 11/20/2022), Disp: 20 tablet, Rfl: 0  Current Facility-Administered Medications:    sodium chloride flush (NS) 0.9 % injection 3 mL, 3 mL, Intravenous, Q12H, Lennette Bihari, MD  Facility-Administered Medications Ordered in Other Visits:    ondansetron (ZOFRAN) 4 mg in sodium chloride 0.9 % 50 mL IVPB, 4 mg, Intravenous, Q6H PRN, Coletta Memos, MD      Objective:   Vitals:   11/20/22 0906  BP: 120/60  Pulse: 60  SpO2: 99%  Weight: 160 lb 12.8 oz (72.9 kg)  Height: 5\' 7"  (1.702 m)    Estimated body mass index is 25.18 kg/m as calculated from the following:   Height as of this encounter: 5\' 7"  (1.702 m).   Weight as of this encounter: 160 lb 12.8 oz (72.9 kg).  @WEIGHTCHANGE @  [employer]   11/20/22 0906  Weight: 160 lb 12.8 oz (72.9 kg)     Physical Exam   General: No distress. Looks better and happier O2 at rest: no Cane present: no Sitting in wheel chair: no Frail: no Obese: no Neuro: Alert and Oriented x 3. GCS 15. Speech normal Psych: Pleasant Resp:  Barrel Chest - no.  Wheeze - no, Crackles - YES BASE, No overt respiratory distress CVS: Normal heart sounds. Murmurs - no Ext: Stigmata of Connective Tissue Disease - no HEENT: Normal upper airway. PEERL +. No post nasal drip        Assessment:       ICD-10-CM   1. ILD (interstitial lung disease) (HCC)  J84.9 Pulmonary  function test    2. H/O agent Orange exposure  Z77.098     3. Dysgeusia  R43.2     4. Unintentional weight loss  R63.4          Plan:     Patient Instructions    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 Hx of Agent Orange Exposure   - Pulmonary Fibrosis  overall progressive.  Hx of covid March 2023 - did not affect your fibrosis.  Stable in the last 2 months despite stopping pirfenidone (due to weight loss and other side effects) .  But noted that side effects of weight loss and loss of taste have all improved after C. difficile treatment and starting ivermectin in June 2024.  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  However and noticed that [facility] rheumatology on 11/18/2022 gave your diagnosis of psoriatic arthritis.  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  However this does not impact current treatment recommendation.   Plan - -Continue monitor without pirfenidone for now -Open to re-starting pirfenidone once ivermectin course is completed  -Please check with the Dr. Mayford Knife and let us know via phone call when ivermectin will be completed -Do spirometry and DLCO in 2-3 months and return for follow-up  Dysgeusia Unintentional weight loss - despite stopping esbriet early April 2024 Fatigue  -Symptoms are all resolving in the last 2 months after stopping pirfenidone but respect your judgment that this could be because of C. difficile or positive treatment effect of ivermectin   Plan - Continue to hold pirfenidone but open to restarting it once ivermectin course is completed -We will also monitor to see if the symptoms happen again once pirfenidone is restarted   Followup - 2-3 months return to see Dr. Marchelle Gearing ; 30-minute visit but after  spirometry and DLCO  -Symptom score and exercise hypoxemia test at follow-up visit   ( Level 05 visit E&M 2024: Estb >= 40 min  in  visit type: on-site physical face to visit  in total care time and counseling or/and coordination of care by this undersigned MD - Dr Kalman Shan. This includes one or more of the following on this same day 11/20/2022: pre-charting, chart review, note writing, documentation discussion of test results, diagnostic or treatment recommendations, prognosis, risks and benefits of management options, instructions, education, compliance or risk-factor reduction. It excludes time spent by the CMA or office staff in the care of the patient. Actual time 45 min)   SIGNATURE    Dr. Kalman Shan, M.D., F.C.C.P,  Pulmonary and Critical Care Medicine Staff Physician, [facility] Director - Interstitial Lung Disease  Program  Pulmonary Fibrosis [facility] at [facility] [place], [place], [address]  Pager: [phone], If no answer or between  15:00h - 7:00h: call [phone] Telephone: [phone]  10:14 AM 11/20/2022

## 2022-11-21 ENCOUNTER — Telehealth: Payer: Self-pay

## 2022-11-21 NOTE — Telephone Encounter (Signed)
Spoke with Patient's wife. She states they received their supplies and equipment. No further help needed at the moment.

## 2022-11-21 NOTE — Telephone Encounter (Signed)
Reached out to the patient and he has received his supplies per his wife Cameron [name].

## 2022-11-25 NOTE — Progress Notes (Signed)
Remote pacemaker transmission.   

## 2022-11-27 ENCOUNTER — Encounter: Payer: Self-pay | Admitting: Internal Medicine

## 2022-12-23 ENCOUNTER — Telehealth: Payer: Self-pay

## 2022-12-23 ENCOUNTER — Other Ambulatory Visit: Payer: Self-pay

## 2022-12-23 ENCOUNTER — Other Ambulatory Visit: Payer: Self-pay | Admitting: Physician Assistant

## 2022-12-23 MED ORDER — SPIRONOLACTONE 25 MG PO TABS: ORAL_TABLET | ORAL | 2 refills | Status: DC

## 2022-12-23 NOTE — Telephone Encounter (Signed)
Need to check with patient to see if he is still taking this on a daily basis if so then refill.  It was on his med list when seen last but I am uncertain if he was still taking it

## 2022-12-24 ENCOUNTER — Telehealth: Payer: Self-pay | Admitting: Cardiovascular Disease

## 2022-12-24 ENCOUNTER — Other Ambulatory Visit: Payer: Self-pay

## 2022-12-24 MED ORDER — DILTIAZEM HCL ER COATED BEADS 120 MG PO CP24: 120.0000 mg | ORAL_CAPSULE | Freq: Every day | ORAL | 2 refills | Status: DC

## 2022-12-24 MED ORDER — TELMISARTAN 80 MG PO TABS: 80.0000 mg | ORAL_TABLET | Freq: Every day | ORAL | 2 refills | Status: DC

## 2022-12-24 MED ORDER — DILTIAZEM HCL ER COATED BEADS 120 MG PO CP24: 120.0000 mg | ORAL_CAPSULE | Freq: Every day | ORAL | 3 refills | Status: DC

## 2022-12-24 NOTE — Telephone Encounter (Signed)
Spoke w/pt and wife yes Pt is taking Diltiazem 120mg  every day. She states that her insurance added [facility]. Will send refill. Refill sent

## 2022-12-24 NOTE — Telephone Encounter (Signed)
Paper Work Dropped Off: Confirmation of Order /Letter of Medical Necessity   Date:12/24/2022  Location of paper: [facility]

## 2022-12-25 ENCOUNTER — Ambulatory Visit: Payer: Medicare Other | Attending: Cardiology | Admitting: Cardiology

## 2022-12-25 ENCOUNTER — Telehealth: Payer: Self-pay | Admitting: Cardiovascular Disease

## 2022-12-25 ENCOUNTER — Encounter: Payer: Self-pay | Admitting: Cardiology

## 2022-12-25 VITALS — BP 138/68 | HR 78 | Ht 67.0 in | Wt 175.6 lb

## 2022-12-25 DIAGNOSIS — Z950 Presence of cardiac pacemaker: Secondary | ICD-10-CM

## 2022-12-25 DIAGNOSIS — I4891 Unspecified atrial fibrillation: Secondary | ICD-10-CM

## 2022-12-25 DIAGNOSIS — I10 Essential (primary) hypertension: Secondary | ICD-10-CM

## 2022-12-25 DIAGNOSIS — I495 Sick sinus syndrome: Secondary | ICD-10-CM | POA: Diagnosis not present

## 2022-12-25 DIAGNOSIS — K922 Gastrointestinal hemorrhage, unspecified: Secondary | ICD-10-CM

## 2022-12-25 LAB — BASIC METABOLIC PANEL
BUN/Creatinine Ratio: 27 — ABNORMAL HIGH (ref 10–24)
BUN: 28 mg/dL — ABNORMAL HIGH (ref 8–27)
CO2: 26 mmol/L (ref 20–29)
Calcium: 10.1 mg/dL (ref 8.6–10.2)
Chloride: 103 mmol/L (ref 96–106)
Creatinine, Ser: 1.05 mg/dL (ref 0.76–1.27)
Glucose: 121 mg/dL — ABNORMAL HIGH (ref 70–99)
Potassium: 5.4 mmol/L — ABNORMAL HIGH (ref 3.5–5.2)
Sodium: 138 mmol/L (ref 134–144)
eGFR: 71 mL/min/{1.73_m2} (ref >59)

## 2022-12-25 MED ORDER — APIXABAN 5 MG PO TABS: 5.0000 mg | ORAL_TABLET | Freq: Two times a day (BID) | ORAL | 3 refills | Status: DC

## 2022-12-25 NOTE — Addendum Note (Signed)
Addended by: Frutoso Schatz on: 12/25/2022 10:41 AM   Modules accepted: Orders

## 2022-12-25 NOTE — Progress Notes (Signed)
Electrophysiology Office Follow up Visit Note:    Date:  12/25/2022   ID:  Cameron [name], DOB 05-Aug-1940, MRN [id number]  PCP:  Marguarite Arbour, MD  [facility] Cardiologist:  Nicki Guadalajara, MD  [facility] Electrophysiologist:  Lanier Prude, MD    Interval History:    Cameron [name] is a 82 y.o. male who presents for a follow up visit.   Last seen Sep 26, 2022 for consideration of watchman.  At his last appointment I was concerned about his weight loss and unstable hemoglobin levels and felt that he was not yet stable enough to undergo watchman implant.  We planned to touch base in August. He is with his wife today in clinic.  He is doing much better than last time I saw him.  He has been taking Eliquis only once a day though.  No signs of bleeding.  Recent hemoglobin was stable.  His weight is back up given his taste has improved.  He is feeling stronger.  He is interested still in avoiding long-term exposure anticoagulation which I think is reasonable given his history of bleeding.       Past medical, surgical, social and family history were reviewed.  ROS:   Please see the history of present illness.    All other systems reviewed and are negative.  EKGs/Labs/Other Studies Reviewed:    The following studies were reviewed today:  December 25, 2022 in clinic device interrogation personally reviewed Battery and lead parameter stable Ventricular pacing greater than 99% Atrial pacing 45% Atrial fibrillation burden 51% with no recent episodes in the last 5 to 6 weeks.       Physical Exam:    VS:  BP 138/68   Pulse 78   Ht 5\' 7"  (1.702 m)   Wt 175 lb 9.6 oz (79.7 kg)   SpO2 98%   BMI 27.50 kg/m     Wt Readings from Last 3 Encounters:  12/25/22 175 lb 9.6 oz (79.7 kg)  11/20/22 160 lb 12.8 oz (72.9 kg)  10/16/22 155 lb (70.3 kg)     GEN:  Well nourished, well developed in no acute distress CARDIAC: RRR, no murmurs, rubs, gallops.  Pacemaker pocket  well-healed RESPIRATORY:  Clear to auscultation without rales, wheezing or rhonchi       ASSESSMENT:    1. Atrial fibrillation, unspecified type (HCC)   2. Primary hypertension   3. Pacemaker   4. SSS (sick sinus syndrome) (HCC)   5. Gastrointestinal hemorrhage, unspecified gastrointestinal hemorrhage type    PLAN:    In order of problems listed above:  #Atrial fibrillation #GI bleeding history Currently taking 2.5 mg by mouth twice daily of Eliquis (goal dose would be 5 twice daily but he has had recurrent GI bleeding) Long discussion with the patient's wife today about the pros and cons of restarting full-strength anticoagulation, restarting half dose anticoagulation twice daily, avoiding anticoagulation altogether or pursuing left atrial appendage occlusion.  We discussed each option in detail.  He would like to restart full-strength anticoagulation with Eliquis 5 mg by mouth twice daily.  I will recheck a CBC in 4 weeks to make sure his hemoglobin is staying stable.  He is interested in proceeding with left atrial appendage occlusion in an effort to avoid long-term exposure anticoagulation.  Recent (July 2024) hemoglobin at [facility] was greater than 12.    --------------------  I have seen Cameron [name] in the office today who is being considered for a  Watchman left atrial appendage closure device. I believe they will benefit from this procedure given their history of atrial fibrillation, CHA2DS2-VASc score of 5 and unadjusted ischemic stroke rate of 7.2% per year. Unfortunately, the patient is not felt to be a long term anticoagulation candidate secondary to GI bleeding. The patient's chart has been reviewed and I feel that they would be a candidate for short term oral anticoagulation after Watchman implant.   It is my belief that after undergoing a LAA closure procedure, Cameron [name] will not need long term anticoagulation which eliminates anticoagulation side effects and major  bleeding risk.   Procedural risks for the Watchman implant have been reviewed with the patient including a 0.5% risk of stroke, <1% risk of perforation and <1% risk of device embolization. Other risks include bleeding, vascular damage, tamponade, worsening renal function, and death. The patient understands these risk and wishes to proceed.     The published clinical data on the safety and effectiveness of WATCHMAN include but are not limited to the following: - Holmes DR, Everlene Farrier, Sick P et al. for the PROTECT AF Investigators. Percutaneous closure of the left atrial appendage versus warfarin therapy for prevention of stroke in patients with atrial fibrillation: a randomised non-inferiority trial. Lancet 2009; 374: 534-42. Everlene Farrier, Doshi SK, Isa Rankin D et al. on behalf of the PROTECT AF Investigators. Percutaneous Left Atrial Appendage Closure for Stroke Prophylaxis in Patients With Atrial Fibrillation 2.3-Year Follow-up of the PROTECT AF (Watchman Left Atrial Appendage System for Embolic Protection in Patients With Atrial Fibrillation) Trial. Circulation 2013; 127:720-729. - Alli O, Doshi S,  Kar S, Reddy VY, Sievert H et al. Quality of Life Assessment in the Randomized PROTECT AF (Percutaneous Closure of the Left Atrial Appendage Versus Warfarin Therapy for Prevention of Stroke in Patients With Atrial Fibrillation) Trial of Patients at Risk for Stroke With Nonvalvular Atrial Fibrillation. J Am Coll Cardiol 2013; 61:1790-8. Aline August DR, Mia [place], Price M, Whisenant B, Sievert H, Doshi S, Huber K, Reddy V. Prospective randomized evaluation of the Watchman left atrial appendage Device in patients with atrial fibrillation versus long-term warfarin therapy; the PREVAIL trial. Journal of the [employer] of Cardiology, Vol. 4, No. 1, 2014, 1-11. - Kar S, Doshi SK, Sadhu A, Horton R, Osorio J et al. Primary outcome evaluation of a next-generation left atrial appendage closure device: results from  the PINNACLE FLX trial. Circulation 2021;143(18)1754-1762.    After today's visit with the patient which was dedicated solely for shared decision making visit regarding LAA closure device, the patient decided to proceed with the LAA appendage closure procedure scheduled to be done in the near future at [facility]. Prior to the procedure, I would like to obtain a gated CT scan of the chest with contrast timed for PV/LA visualization.    HAS-BLED score 2 Hypertension No  Abnormal renal and liver function (Dialysis, transplant, Cr >2.26 mg/dL /Cirrhosis or Bilirubin >2x Normal or AST/ALT/AP >3x Normal) No  Stroke No  Bleeding Yes  Labile INR (Unstable/high INR) No  Elderly (>65) Yes  Drugs or alcohol (? 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  Continue remote monitoring     Signed, Steffanie Dunn, MD, [facility], [facility]  12/25/2022 10:31 AM    Electrophysiology [facility]

## 2022-12-25 NOTE — Telephone Encounter (Signed)
Returned pt call in regards to them wanting to talk to the [name] or his nurse. Informed them that the doctor is on vacation but I will send it to the covering doctor and the nurse. He is having a procedure and they don't know anything about it and are on the fence about it.

## 2022-12-25 NOTE — Patient Instructions (Signed)
Medication Instructions:  Your physician has recommended you make the following change in your medication: 1) INCREASE Eliquis to 5 mg twice daily *If you need a refill on your cardiac medications before your next appointment, please call your pharmacy*   Lab Work: TODAY: BMET IN 4 WEEKS: CBC  If you have labs (blood work) drawn today and your tests are completely normal, you will receive your results only by: MyChart Message (if you have MyChart) OR A paper copy in the mail If you have any lab test that is abnormal or we need to change your treatment, we will call you to review the results.  Testing/Procedures: Your physician has requested that you have cardiac CT. Cardiac computed tomography (CT) is a painless test that uses an x-ray machine to take clear, detailed pictures of your heart. For further information please visit [email or web address] Please follow instruction sheet as given.   Watchman: Your physician has requested that you have Left atrial appendage (LAA) closure device implantation is a procedure to put a small device in the LAA of the heart. The LAA is a small sac in the wall of the heart's left upper chamber. Blood clots can form in this area. The device, Watchman closes the LAA to help prevent a blood clot and stroke.    Follow-Up: At [facility] you and your health needs are our priority.  As part of our continuing mission to provide you with exceptional heart care, we have created designated Provider Care Teams.  These Care Teams include your primary Cardiologist (physician) and Advanced Practice Providers (APPs -  Physician Assistants and Nurse Practitioners) who all work together to provide you with the care you need, when you need it.   Your next appointment:   After your testing is completed you will be contacted by Nurse Navigator, Karsten Fells to schedule your pre-procedure visit and procedure date. If you have any questions she can be reached at  [phone].

## 2022-12-25 NOTE — Telephone Encounter (Signed)
New Message:        Patient's wife would like for Dr Tresa Endo or his nurse to please give them a call. Patient other doctor wants him to have a procedure and they want to discuss this with Dr Tresa Endo or his nurse.

## 2022-12-26 NOTE — Telephone Encounter (Signed)
Patient wife is calling about watchman procedure recommended by Lalla Brothers. I explained what watchman procedure was. She has more questions she would like to a call back to discuss from Dr. Geannie Risen nurse. She also have a question about his pacemaker. She states the person that did the check was concerned and she would like to discuss that as well.

## 2022-12-26 NOTE — Telephone Encounter (Signed)
Pt's wife is calling to f/u on receiving a callback from MD nurse regarding a procedure before they jump right into it. She'd like a callback at [phone].  Please advise

## 2022-12-29 ENCOUNTER — Encounter: Payer: Self-pay | Admitting: Cardiology

## 2022-12-29 ENCOUNTER — Telehealth: Payer: Self-pay

## 2022-12-29 DIAGNOSIS — I4891 Unspecified atrial fibrillation: Secondary | ICD-10-CM

## 2022-12-29 NOTE — Telephone Encounter (Signed)
The patient has been notified of the result and verbalized understanding.  All questions (if any) were answered. Frutoso Schatz, RN 12/29/2022 1:16 PM

## 2022-12-29 NOTE — Telephone Encounter (Signed)
Spoke with the patient's wife who states that the patient would like to wait until January to have Watchman procedure done.

## 2022-12-29 NOTE — Telephone Encounter (Signed)
-----   Message from Rossie [facility] sent at 12/28/2022 10:34 PM EDT ----- Potassium slightly elevated.   Carly, can you have him recheck his BMP in 2 weeks? He should follow up with his primary care physician after those repeat labs.   Sheria Lang T. Lalla Brothers, MD, [facility], [facility] Cardiac Electrophysiology

## 2023-01-01 ENCOUNTER — Telehealth: Payer: Self-pay | Admitting: Cardiovascular Disease

## 2023-01-01 NOTE — Telephone Encounter (Signed)
Forwarded this message to the provider and his nurse to see if they are aware of this.

## 2023-01-01 NOTE — Telephone Encounter (Signed)
  Cameron [name] is returning call, she said to leave her detailed message about the paperwork if she unable to answer her phone

## 2023-01-01 NOTE — Telephone Encounter (Signed)
[facility] is calling to follow up on a form they've been needing to have filled out. Crystal from [facility] is stating they've been trying to have this form filled out by our team since May. Please advise.

## 2023-01-01 NOTE — Telephone Encounter (Signed)
Left voicemail to return call to office.

## 2023-01-05 NOTE — Telephone Encounter (Signed)
Patient is aware that the doctor will be back in the office on Monday 01/12/23.

## 2023-01-11 ENCOUNTER — Encounter: Payer: Self-pay | Admitting: Cardiology

## 2023-01-11 NOTE — Telephone Encounter (Signed)
I am unaware of what form needs to be filled out.  I am in the office tomorrow, 8/19 try to ascertain what is necessary

## 2023-01-12 ENCOUNTER — Other Ambulatory Visit
Admission: RE | Admit: 2023-01-12 | Discharge: 2023-01-12 | Disposition: A | Discharge status: Home / Self Care | Payer: Medicare Other | Source: Ambulatory Visit | Attending: Cardiology | Admitting: Cardiology

## 2023-01-12 DIAGNOSIS — Z950 Presence of cardiac pacemaker: Secondary | ICD-10-CM

## 2023-01-12 DIAGNOSIS — K922 Gastrointestinal hemorrhage, unspecified: Secondary | ICD-10-CM | POA: Diagnosis present

## 2023-01-12 DIAGNOSIS — I4891 Unspecified atrial fibrillation: Secondary | ICD-10-CM | POA: Diagnosis present

## 2023-01-12 DIAGNOSIS — I495 Sick sinus syndrome: Secondary | ICD-10-CM

## 2023-01-12 DIAGNOSIS — I10 Essential (primary) hypertension: Secondary | ICD-10-CM

## 2023-01-12 LAB — BASIC METABOLIC PANEL
Anion gap: 7 (ref 5–15)
BUN: 29 mg/dL — ABNORMAL HIGH (ref 8–23)
CO2: 22 mmol/L (ref 22–32)
Calcium: 9.1 mg/dL (ref 8.9–10.3)
Chloride: 105 mmol/L (ref 98–111)
Creatinine, Ser: 1.02 mg/dL (ref 0.61–1.24)
GFR, Estimated: >60 mL/min (ref >60)
Glucose, Bld: 145 mg/dL — ABNORMAL HIGH (ref 70–99)
Potassium: 4.4 mmol/L (ref 3.5–5.1)
Sodium: 134 mmol/L — ABNORMAL LOW (ref 135–145)

## 2023-01-12 LAB — CBC
HCT: 31.3 % — ABNORMAL LOW (ref 39.0–52.0)
Hemoglobin: 10.4 g/dL — ABNORMAL LOW (ref 13.0–17.0)
MCH: 31.7 pg (ref 26.0–34.0)
MCHC: 33.2 g/dL (ref 30.0–36.0)
MCV: 95.4 fL (ref 80.0–100.0)
Platelets: 165 10*3/uL (ref 150–400)
RBC: 3.28 MIL/uL — ABNORMAL LOW (ref 4.22–5.81)
RDW: 15.5 % (ref 11.5–15.5)
WBC: 6.8 10*3/uL (ref 4.0–10.5)
nRBC: 0 % (ref 0.0–0.2)

## 2023-01-14 ENCOUNTER — Telehealth: Payer: Self-pay | Admitting: Cardiology

## 2023-01-14 ENCOUNTER — Ambulatory Visit: Payer: Medicare Other | Admitting: Urology

## 2023-01-14 NOTE — Telephone Encounter (Signed)
Spoke with the patient's wife who states that the patient is not having any signs/symptoms of bleeding. Dr.  Lalla Brothers is aware of lab results and advised for patient to follow up with his PCP and repeat labs in a couple of months. Patient is holding off on Watchman until next year. Patient has an appointment with his PCP next week and an appointment with oncology the week after.

## 2023-01-14 NOTE — Telephone Encounter (Signed)
Patient's wife called stating the patient's hemoglobin was at a 10 on Monday. Patient's wife stated his hemoglobin is dropping and is very concerned.

## 2023-01-19 ENCOUNTER — Encounter: Payer: Self-pay | Admitting: Dermatology

## 2023-01-19 ENCOUNTER — Ambulatory Visit: Payer: Medicare Other | Admitting: Dermatology

## 2023-01-19 DIAGNOSIS — L821 Other seborrheic keratosis: Secondary | ICD-10-CM

## 2023-01-19 DIAGNOSIS — D229 Melanocytic nevi, unspecified: Secondary | ICD-10-CM

## 2023-01-19 DIAGNOSIS — L409 Psoriasis, unspecified: Secondary | ICD-10-CM

## 2023-01-19 DIAGNOSIS — D1801 Hemangioma of skin and subcutaneous tissue: Secondary | ICD-10-CM

## 2023-01-19 DIAGNOSIS — L570 Actinic keratosis: Secondary | ICD-10-CM

## 2023-01-19 DIAGNOSIS — W908XXA Exposure to other nonionizing radiation, initial encounter: Secondary | ICD-10-CM

## 2023-01-19 DIAGNOSIS — L578 Other skin changes due to chronic exposure to nonionizing radiation: Secondary | ICD-10-CM

## 2023-01-19 DIAGNOSIS — D2371 Other benign neoplasm of skin of right lower limb, including hip: Secondary | ICD-10-CM

## 2023-01-19 DIAGNOSIS — Z1283 Encounter for screening for malignant neoplasm of skin: Secondary | ICD-10-CM

## 2023-01-19 DIAGNOSIS — B354 Tinea corporis: Secondary | ICD-10-CM

## 2023-01-19 DIAGNOSIS — L814 Other melanin hyperpigmentation: Secondary | ICD-10-CM

## 2023-01-19 DIAGNOSIS — D2372 Other benign neoplasm of skin of left lower limb, including hip: Secondary | ICD-10-CM

## 2023-01-19 DIAGNOSIS — Z85828 Personal history of other malignant neoplasm of skin: Secondary | ICD-10-CM

## 2023-01-19 DIAGNOSIS — D239 Other benign neoplasm of skin, unspecified: Secondary | ICD-10-CM

## 2023-01-19 NOTE — Patient Instructions (Addendum)
Treatment Plan: Start Vtama once daily to right elbow until clear.  Continue Terbinafine cream twice daily as needed for flares to buttocks and thighs.  Cryotherapy Aftercare  Wash gently with soap and water everyday.   Apply Vaseline and Band-Aid daily until healed.    Melanoma ABCDEs  Melanoma is the most dangerous type of skin cancer, and is the leading cause of death from skin disease.  You are more likely to develop melanoma if you: Have light-colored skin, light-colored eyes, or red or blond hair Spend a lot of time in the sun Tan regularly, either outdoors or in a tanning bed Have had blistering sunburns, especially during childhood Have a close family member who has had a melanoma Have atypical moles or large birthmarks  Early detection of melanoma is key since treatment is typically straightforward and cure rates are extremely high if we catch it early.   The first sign of melanoma is often a change in a mole or a new dark spot.  The ABCDE system is a way of remembering the signs of melanoma.  A for asymmetry:  The two halves do not match. B for border:  The edges of the growth are irregular. C for color:  A mixture of colors are present instead of an even brown color. D for diameter:  Melanomas are usually (but not always) greater than 6mm - the size of a pencil eraser. E for evolution:  The spot keeps changing in size, shape, and color.  Please check your skin once per month between visits. You can use a small mirror in front and a large mirror behind you to keep an eye on the back side or your body.   If you see any new or changing lesions before your next follow-up, please call to schedule a visit.  Please continue daily skin protection including broad spectrum sunscreen SPF 30+ to sun-exposed areas, reapplying every 2 hours as needed when you're outdoors.    Due to recent changes in healthcare laws, you may see results of your pathology and/or laboratory studies on  MyChart before the doctors have had a chance to review them. We understand that in some cases there may be results that are confusing or concerning to you. Please understand that not all results are received at the same time and often the doctors may need to interpret multiple results in order to provide you with the best plan of care or course of treatment. Therefore, we ask that you please give [place] 2 business days to thoroughly review all your results before contacting the office for clarification. Should we see a critical lab result, you will be contacted sooner.   If You Need Anything After Your Visit  If you have any questions or concerns for your doctor, please call our main line at [phone] and press option 4 to reach your doctor's medical assistant. If no one answers, please leave a voicemail as directed and we will return your call as soon as possible. Messages left after 4 pm will be answered the following business day.   You may also send [place] a message via MyChart. We typically respond to MyChart messages within 1-2 business days.  For prescription refills, please ask your pharmacy to contact our office. Our fax number is [phone].  If you have an urgent issue when the clinic is closed that cannot wait until the next business day, you can page your doctor at the number below.    Please note that while  we do our best to be available for urgent issues outside of office hours, we are not available 24/7.   If you have an urgent issue and are unable to reach [place], you may choose to seek medical care at your doctor's office, retail clinic, urgent care center, or emergency room.  If you have a medical emergency, please immediately call 911 or go to the emergency department.  Pager Numbers  - Dr. Gwen Pounds: [phone]  - Dr. Roseanne Reno: [phone]  - Dr. Katrinka Blazing: [phone]   In the event of inclement weather, please call our main line at [phone] for an update on the status of  any delays or closures.  Dermatology Medication Tips: Please keep the boxes that topical medications come in in order to help keep track of the instructions about where and how to use these. Pharmacies typically print the medication instructions only on the boxes and not directly on the medication tubes.   If your medication is too expensive, please contact our office at [phone] option 4 or send [place] a message through MyChart.   We are unable to tell what your co-pay for medications will be in advance as this is different depending on your insurance coverage. However, we may be able to find a substitute medication at lower cost or fill out paperwork to get insurance to cover a needed medication.   If a prior authorization is required to get your medication covered by your insurance company, please allow [place] 1-2 business days to complete this process.  Drug prices often vary depending on where the prescription is filled and some pharmacies may offer cheaper prices.  The website www.goodrx.com contains coupons for medications through different pharmacies. The prices here do not account for what the cost may be with help from insurance (it may be cheaper with your insurance), but the website can give you the price if you did not use any insurance.  - You can print the associated coupon and take it with your prescription to the pharmacy.  - You may also stop by our office during regular business hours and pick up a GoodRx coupon card.  - If you need your prescription sent electronically to a different pharmacy, notify our office through [facility] or by phone at [phone] option 4.

## 2023-01-19 NOTE — Progress Notes (Signed)
Follow-Up Visit   Subjective  Cameron [name] is a 82 y.o. male who presents for the following: Skin Cancer Screening and Full Body Skin Exam  The patient presents for Total-Body Skin Exam (TBSE) for skin cancer screening and mole check. The patient has spots, moles and lesions to be evaluated, some may be new or changing and the patient may have concern these could be cancer.  Patient with hx of SCC and psoriasis. He is using OTC lamisil at right elbow, buttocks and post thighs. He ha sVtama but is not using it currently. Patient does have some rough spots at arms and chest.   The following portions of the chart were reviewed this encounter and updated as appropriate: medications, allergies, medical history  Review of Systems:  No other skin or systemic complaints except as noted in HPI or Assessment and Plan.  Objective  Well appearing patient in no apparent distress; mood and affect are within normal limits.  A full examination was performed including scalp, head, eyes, ears, nose, lips, neck, chest, axillae, abdomen, back, buttocks, bilateral upper extremities, bilateral lower extremities, hands, feet, fingers, toes, fingernails, and toenails. All findings within normal limits unless otherwise noted below.   Relevant physical exam findings are noted in the Assessment and Plan.  L dorsal hand x 2, L forearm x 3, R dorsal hand x 3, R forearm x 3, R chest x 1, L temple x 1 (12) Erythematous thin papules/macules with gritty scale.     Assessment & Plan   SKIN CANCER SCREENING PERFORMED TODAY.  ACTINIC DAMAGE - Chronic condition, secondary to cumulative UV/sun exposure - diffuse scaly erythematous macules with underlying dyspigmentation - Recommend daily broad spectrum sunscreen SPF 30+ to sun-exposed areas, reapply every 2 hours as needed.  - Staying in the shade or wearing long sleeves, sun glasses (UVA+UVB protection) and wide brim hats (4-inch brim around the entire  circumference of the hat) are also recommended for sun protection.  - Call for new or changing lesions.  LENTIGINES, SEBORRHEIC KERATOSES, HEMANGIOMAS - Benign normal skin lesions - Benign-appearing - Call for any changes  MELANOCYTIC NEVI - Tan-brown and/or pink-flesh-colored symmetric macules and papules - Benign appearing on exam today - Observation - Call clinic for new or changing moles - Recommend daily use of broad spectrum spf 30+ sunscreen to sun-exposed areas.   HISTORY OF SQUAMOUS CELL CARCINOMA OF THE SKIN - No evidence of recurrence today at right upper arm - No lymphadenopathy - Recommend regular full body skin exams - Recommend daily broad spectrum sunscreen SPF 30+ to sun-exposed areas, reapply every 2 hours as needed.  - Call if any new or changing lesions are noted between office visits  PSORIASIS Exam: Well demarcated erythematous plaque at right extensor elbow < 1% BSA.  Chronic and persistent condition with duration or expected duration over one year. Condition is symptomatic/ bothersome to patient. Not currently at goal.   Psoriasis is a chronic non-curable, but treatable genetic/hereditary disease that may have other systemic features affecting other organ systems such as joints (Psoriatic Arthritis). It is associated with an increased risk of inflammatory bowel disease, heart disease, non-alcoholic fatty liver disease, and depression.  Treatments include light and laser treatments; topical medications; and systemic medications including oral and injectables.  Treatment Plan: Start Vtama once daily to right elbow until clear.   DERMATOFIBROMA Exam: Firm pink/brown papulenodule with dimple sign at right medial foot, left lower leg distal. Treatment Plan: A dermatofibroma is a benign growth  possibly related to trauma, such as an insect bite, cut from shaving, or inflamed acne-type bump.  Treatment options to remove include shave or excision with resulting scar  and risk of recurrence.  Since benign-appearing and not bothersome, will observe for now.   Tinea corporis buttocks, posterior thighs  Exam: annular erythematous scaly patches on posterior thighs and buttocks  Chronic condition with duration over one year. Currently flaring. Not at patient goal   Continue Terbinafine cream twice daily until clear.   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 They are chronic with expected duration over 1 year. A portion of actinic keratoses will progress to squamous cell carcinoma of the skin. It is not possible to reliably predict which spots will progress to skin cancer and so treatment is recommended to prevent development of skin cancer.  Recommend daily broad spectrum sunscreen SPF 30+ to sun-exposed areas, reapply every 2 hours as needed.  Recommend staying in the shade or wearing long sleeves, sun glasses (UVA+UVB protection) and wide brim hats (4-inch brim around the entire circumference of the hat). Call for new or changing lesions.   Destruction of lesion - L dorsal hand x 2, L forearm x 3, R dorsal hand x 3, R forearm x 3, R chest x 1, L temple x 1 (12)  Destruction method: cryotherapy   Informed consent: discussed and consent obtained   Lesion destroyed using liquid nitrogen: Yes   Cryotherapy cycles:  2 Outcome: patient tolerated procedure well with no complications   Post-procedure details: wound care instructions given     Return in about 1 year (around 01/19/2024) for TBSE, Hx SCC, Psoriasis.  Anise Salvo, RMA, am acting as scribe for Elie Goody, MD .   Documentation: I have reviewed the above documentation for accuracy and completeness, and I agree with the above.  Elie Goody, MD

## 2023-01-20 ENCOUNTER — Encounter: Payer: Self-pay | Admitting: Internal Medicine

## 2023-01-20 ENCOUNTER — Ambulatory Visit (INDEPENDENT_AMBULATORY_CARE_PROVIDER_SITE_OTHER): Payer: Medicare Other | Admitting: Internal Medicine

## 2023-01-20 ENCOUNTER — Other Ambulatory Visit (INDEPENDENT_AMBULATORY_CARE_PROVIDER_SITE_OTHER): Payer: Medicare Other

## 2023-01-20 VITALS — BP 136/60 | HR 72 | Ht 65.0 in | Wt 171.4 lb

## 2023-01-20 DIAGNOSIS — D649 Anemia, unspecified: Secondary | ICD-10-CM

## 2023-01-20 LAB — IBC + FERRITIN
Ferritin: 248.7 ng/mL (ref 22.0–322.0)
Iron: 61 ug/dL (ref 42–165)
Saturation Ratios: 16.5 % — ABNORMAL LOW (ref 20.0–50.0)
TIBC: 369.6 ug/dL (ref 250.0–450.0)
Transferrin: 264 mg/dL (ref 212.0–360.0)

## 2023-01-20 LAB — VITAMIN B12: Vitamin B-12: 371 pg/mL (ref 211–911)

## 2023-01-20 LAB — FOLATE: Folate: 21.9 ng/mL (ref >5.9)

## 2023-01-20 NOTE — Patient Instructions (Signed)
Your provider has requested that you go to the basement level for lab work before leaving today. Press "B" on the elevator. The lab is located at the first door on the left as you exit the elevator.  _______________________________________________________  If your blood pressure at your visit was 140/90 or greater, please contact your primary care physician to follow up on this.  _______________________________________________________  If you are age 83 or older, your body mass index should be between 23-30. Your Body mass index is 28.52 kg/m. If this is out of the aforementioned range listed, please consider follow up with your Primary Care Provider.  If you are age 25 or younger, your body mass index should be between 19-25. Your Body mass index is 28.52 kg/m. If this is out of the aformentioned range listed, please consider follow up with your Primary Care Provider.   ________________________________________________________  The [facility] providers would like to encourage you to use [facility] to communicate with providers for non-urgent requests or questions.  Due to long hold times on the telephone, sending your provider a message by [facility] may be a faster and more efficient way to get a response.  Please allow 48 business hours for a response.  Please remember that this is for non-urgent requests.  _______________________________________________________   Due to recent changes in healthcare laws, you may see the results of your imaging and laboratory studies on MyChart before your provider has had a chance to review them.  We understand that in some cases there may be results that are confusing or concerning to you. Not all laboratory results come back in the same time frame and the provider may be waiting for multiple results in order to interpret others.  Please give [place] 48 hours in order for your provider to thoroughly review all the results before contacting the office for clarification of  your results.    Thank you for entrusting me with your care and for choosing [facility], Dr. Eulah Pont

## 2023-01-20 NOTE — Telephone Encounter (Signed)
Spoke with wife of patient. The forms were regarding cpap supplies. Per wife, patient has all the supplies needed for right now. Will call back as needed.

## 2023-01-20 NOTE — Progress Notes (Addendum)
Chief Complaint: IDA  HPI : 82 year old male with history of A-fib on Eliquis, heart block s/p PM, CAD s/p PCI, OSA on CPAP, COPD, hypothyroidism, psoriatic arthritis, DM presents for follow up of IDA and weight loss  Interval History: His taste has come back so he is eating much better. He is now gaining weight, but he doesn't want to gain too much weight back. He finished his C dif treatment. Denies any blood in the stools. Denies any changes in his stools. Denies N&V. He is having regular stools. Denies abdominal pain.   Wt Readings from Last 3 Encounters:  01/20/23 171 lb 6 oz (77.7 kg)  12/25/22 175 lb 9.6 oz (79.7 kg)  11/20/22 160 lb 12.8 oz (72.9 kg)   Current Outpatient Medications  Medication Sig Dispense Refill   acetaminophen (TYLENOL) 500 MG tablet Take 500 mg by mouth as needed for moderate pain.     allopurinol (ZYLOPRIM) 300 MG tablet Take 300 mg by mouth daily.     apixaban (ELIQUIS) 5 MG TABS tablet Take 1 tablet (5 mg total) by mouth 2 (two) times daily. 180 tablet 3   BERBERINE CHLORIDE PO Take 1,500 mg by mouth.     diltiazem (CARDIZEM CD) 120 MG 24 hr capsule Take 1 capsule (120 mg total) by mouth daily. 90 capsule 2   leflunomide (ARAVA) 10 MG tablet Take 1 tablet by mouth daily.     megestrol (MEGACE) 20 MG tablet Take 20 mg by mouth daily.     nebivolol (BYSTOLIC) 5 MG tablet Take 5 mg by mouth daily.     Omega-3 Fatty Acids (OMEGA-3 FISH OIL PO) Take 1 capsule by mouth daily.     pravastatin (PRAVACHOL) 20 MG tablet Take 1 tablet (20 mg total) by mouth every evening. 90 tablet 3   telmisartan (MICARDIS) 80 MG tablet Take 1 tablet (80 mg total) by mouth daily. 90 tablet 2   thyroid (ARMOUR) 90 MG tablet Take 90 mg by mouth every morning.      triamcinolone ointment (KENALOG) 0.5 % Apply 1 Application topically 2 (two) times daily. 30 g 0   TURMERIC PO Take 720 mg by mouth.     VTAMA 1 % CREA APPLY 1 APPLICATION TOPICALLY DAILY 60 g 5   colchicine 0.6 MG tablet  Take 0.6 mg by mouth daily. (Patient not taking: Reported on 01/20/2023)     nitroGLYCERIN (NITROSTAT) 0.4 MG SL tablet Place 1 tablet (0.4 mg total) under the tongue every 5 (five) minutes as needed for chest pain. (Patient not taking: Reported on 01/19/2023) 75 tablet 2   Current Facility-Administered Medications  Medication Dose Route Frequency Provider Last Rate Last Admin   sodium chloride flush (NS) 0.9 % injection 3 mL  3 mL Intravenous Q12H Lennette Bihari, MD       Facility-Administered Medications Ordered in Other Visits  Medication Dose Route Frequency Provider Last Rate Last Admin   ondansetron (ZOFRAN) 4 mg in sodium chloride 0.9 % 50 mL IVPB  4 mg Intravenous Q6H PRN Coletta Memos, MD       Physical Exam: BP 136/60 (BP Location: Left Arm, Patient Position: Sitting, Cuff Size: Normal)   Pulse 72   Ht 5\' 5"  (1.651 m) Comment: height measured without shoes  Wt 171 lb 6 oz (77.7 kg)   BMI 28.52 kg/m  Constitutional: Pleasant,well-developed, male in no acute distress. HEENT: Normocephalic and atraumatic. Conjunctivae are normal. No scleral icterus. Cardiovascular: Normal rate, regular rhythm.  Pulmonary/chest: Effort normal and breath sounds normal. No wheezing, rales or rhonchi. Abdominal: Soft, nondistended, nontender. Bowel sounds active throughout. There are no masses palpable. No hepatomegaly. Extremities: No edema Neurological: Alert and oriented to person place and time. Skin: Skin is warm and dry. No rashes noted. Psychiatric: Normal mood and affect. Behavior is normal.  Labs 05/2022: CBC with low Hb of 10.6. CMP with elevated glucose of 196. TSH nml.   Labs 08/2022: CBC with low Hb of 11.9. TSH nml. Vit B12 nml. HbA1C 7.8%. Vit D nml.   Labs 09/2022: BMP with elevated BUN of 38 and elevated Cr of 1.31. CBC with nml Hb of 13.9. [facility] Gastrointestinal test with C dif 10/21/22 was positive for C dif toxin.   Labs 12/2022: CBC with low Hb of 10.4. BMP unremarkable.  CT  Enterography 05/29/22: IMPRESSION: 1. No acute abdominopelvic findings. 2. Non formed stool in the LEFT colon. 3. Small bilateral pleural effusions.  EGD 05/24/22:   Colonoscopy 05/25/22:   VCE 05/25/22   ASSESSMENT AND PLAN: IDA - resolved Fatigue - resolved Altered taste - resolved Weight loss - resolved Anemia Duodenal stenosis History of C dif Patient is overall doing fairly well symptomatically.  His taste has improved back to normal.  His weight is now increasing.  Perhaps his C. difficile infection was a source of his weight loss and altered taste.  His C. difficile infection never presented with any significant diarrhea.  Interestingly on his recent labs his anemia has worsened again.  Will plan for a comprehensive anemia workup to determine if iron deficiency is contributing to his anemia.  If anemia is due to iron deficiency, could consider a SBE for further evaluation. - Check ferritin/IBC, reticulocytes, vitamin B12, folate, haptoglobin, LDH - Recall for colonoscopy in 04/2027 due to fair prep on last colonoscopy  Eulah Pont, MD  I spent 34 minutes of time, including in depth chart review, independent review of results as outlined above, communicating results with the patient directly, face-to-face time with the patient, coordinating care, ordering studies and medications as appropriate, and documentation.

## 2023-01-21 ENCOUNTER — Encounter: Payer: Medicare Other | Admitting: Dermatology

## 2023-01-21 ENCOUNTER — Ambulatory Visit
Admission: RE | Admit: 2023-01-21 | Discharge: 2023-01-21 | Disposition: A | Discharge status: Home / Self Care | Payer: Medicare Other | Source: Ambulatory Visit | Attending: Urology | Admitting: Urology

## 2023-01-21 ENCOUNTER — Encounter: Payer: Self-pay | Admitting: Urology

## 2023-01-21 ENCOUNTER — Ambulatory Visit: Payer: Medicare Other | Admitting: Urology

## 2023-01-21 ENCOUNTER — Ambulatory Visit (INDEPENDENT_AMBULATORY_CARE_PROVIDER_SITE_OTHER): Payer: Medicare Other | Admitting: Internal Medicine

## 2023-01-21 VITALS — BP 144/75 | Ht 65.0 in | Wt 171.0 lb

## 2023-01-21 DIAGNOSIS — J849 Interstitial pulmonary disease, unspecified: Secondary | ICD-10-CM | POA: Diagnosis not present

## 2023-01-21 DIAGNOSIS — N401 Enlarged prostate with lower urinary tract symptoms: Secondary | ICD-10-CM | POA: Diagnosis not present

## 2023-01-21 DIAGNOSIS — N200 Calculus of kidney: Secondary | ICD-10-CM | POA: Diagnosis not present

## 2023-01-21 DIAGNOSIS — R3129 Other microscopic hematuria: Secondary | ICD-10-CM

## 2023-01-21 DIAGNOSIS — N3281 Overactive bladder: Secondary | ICD-10-CM

## 2023-01-21 LAB — URINALYSIS, COMPLETE
Bilirubin, UA: NEGATIVE
Glucose, UA: NEGATIVE
Ketones, UA: NEGATIVE
Leukocytes,UA: NEGATIVE
Nitrite, UA: NEGATIVE
RBC, UA: NEGATIVE
Specific Gravity, UA: 1.015 (ref 1.005–1.030)
Urobilinogen, Ur: 0.2 mg/dL (ref 0.2–1.0)
pH, UA: 5.5 (ref 5.0–7.5)

## 2023-01-21 LAB — EXTRA SPECIMEN

## 2023-01-21 LAB — PULMONARY FUNCTION TEST
DL/VA % pred: 125 %
DL/VA: 4.95 ml/min/mmHg/L
DLCO cor % pred: 103 %
DLCO cor: 21.63 ml/min/mmHg
DLCO unc % pred: 88 %
DLCO unc: 18.55 ml/min/mmHg
FEF 25-75 Pre: 2.65 L/s
FEF2575-%Pred-Pre: 180 %
FEV1-%Pred-Pre: 102 %
FEV1-Pre: 2.3 L
FEV1FVC-%Pred-Pre: 116 %
FEV6-%Pred-Pre: 93 %
FEV6-Pre: 2.77 L
FEV6FVC-%Pred-Pre: 108 %
FVC-%Pred-Pre: 86 %
FVC-Pre: 2.77 L
Pre FEV1/FVC ratio: 83 %
Pre FEV6/FVC Ratio: 100 %

## 2023-01-21 LAB — RETICULOCYTES
ABS Retic: 91800 {cells}/uL — ABNORMAL HIGH (ref 25000–90000)
Retic Ct Pct: 2.7 %

## 2023-01-21 LAB — MICROSCOPIC EXAMINATION: Epithelial Cells (non renal): >10 /hpf — AB (ref 0–10)

## 2023-01-21 LAB — HAPTOGLOBIN: Haptoglobin: 178 mg/dL (ref 43–212)

## 2023-01-21 LAB — LACTATE DEHYDROGENASE: LDH: 135 U/L (ref 120–250)

## 2023-01-21 LAB — BLADDER SCAN AMB NON-IMAGING: PVR: 0 WU

## 2023-01-21 MED ORDER — GEMTESA 75 MG PO TABS: 75.0000 mg | ORAL_TABLET | Freq: Every day | ORAL | Status: DC

## 2023-01-21 NOTE — Progress Notes (Signed)
I,Dina M Abdulla,acting as a scribe for Riki Altes, MD.,have documented all relevant documentation on the behalf of Riki Altes, MD,as directed by  Riki Altes, MD while in the presence of Riki Altes, MD.  01/21/2023 3:05 PM   Cameron [name] 1940/06/30 [id number]  Referring provider: Marguarite Arbour, MD [address] [facility] [place],  [place] [address]  Chief Complaint  Patient presents with   Benign Prostatic Hypertrophy    Urologic history:  1.  BPH with detrusor overactivity -Long history of storage related voiding symptoms who did well for several years on anticholinergic medication. -Worsening voiding symptoms and 2017 and a daily study showed bladder outlet obstruction with detrusor overactivity. -TURP July 2018 at [facility] -Significant urge incontinence post TUR however at his last follow-up February 2019 this had significantly improved -Trospium 60 mg daily   2.  Recurrent balanitis with phimosis -Dorsal slit 08/29/2019  3.  Erectile dysfunction -PDE 5 inhibitors, VED ineffective -Not interested in pursuing intracavernosal injections or implant surgery  4. Microhematuria -UA August 2023 with 3-10 RBCs. -CTU with bilateral non-obstructing renal calculi. Largest, a 9 mm calculus in the left kidney. -Cystoscopy with TUR defect and mild adenoma regrowth.    HPI: 82 y.o. male presents for annual follow-up.  For the last 6 weeks, he has had intermittent episodes of urinary incontinence occurring at night. He states he will empty prior to making it to the toilet, but does not have any associated urgency. He states this is getting better. He is on tropsium, which is currently being prescribed by Dr. Judithann Sheen. Denies dysuria or gross hematuria. No flank, abdominal, or pelvic pain.   PMH: Past Medical History:  Diagnosis Date   Anemia    Anxiety self recent   Appendicitis    Atrial fibrillation (HCC) 12/12/2013   Bone cancer [facility])  father  59   BPH (benign prostatic hypertrophy)    CAD (coronary artery disease) 12/2015   Cath by Dr Katrinka Blazing reveals distal and small vessel CAD.  Medical therapy advised.   Chest pain 12/03/2015   CHF (congestive heart failure) (HCC)    Complete heart block (HCC)    s/p PPM   Coronary artery disease    Coronary artery disease involving native coronary artery of native heart with unstable angina pectoris (HCC) 08/11/2017   D-dimer, elevated 04/03/2017   Depression self recent   Drug reaction 07/11/2021   GERD (gastroesophageal reflux disease)    History of blood transfusion 1968   "probably; related to getting wounded in [place]"   History of kidney stones    History of SCC (squamous cell carcinoma) of skin 07/24/2020   right upper arm/excision   Hyperglycemia 11/05/2013   Hyperlipidemia 11/05/2013   Hypertension    Hypothyroidism    Hypothyroidism, unspecified 11/05/2013   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  AHI was 20/h while supine.  Oxygen saturations dropped to 87%.  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   Pancreatic cancer [facility]) mother at age 57   Presence of permanent cardiac pacemaker 12/09/2011   Rheumatoid arthritis (HCC)    "hands" (08/11/2017)   Type II diabetes mellitus (HCC)     Surgical History: Past Surgical History:  Procedure Laterality Date   BACK SURGERY     BALLOON DILATION  N/A 05/24/2022   Procedure: BALLOON DILATION;  Surgeon: Imogene Burn, MD;  Location: [facility] ENDOSCOPY;  Service: Gastroenterology;  Laterality: N/A;   CARDIAC CATHETERIZATION N/A 12/28/2015   Procedure: Left Heart Cath and Coronary Angiography;  Surgeon: Lyn Records, MD;  Location: [facility] INVASIVE CV LAB;  Service: Cardiovascular;  Laterality: N/A;   CATARACT EXTRACTION W/ INTRAOCULAR LENS  IMPLANT, BILATERAL Bilateral    COLONOSCOPY  WITH PROPOFOL N/A 05/25/2022   Procedure: COLONOSCOPY WITH PROPOFOL;  Surgeon: Imogene Burn, MD;  Location: [facility] ENDOSCOPY;  Service: Gastroenterology;  Laterality: N/A;   CORONARY ANGIOPLASTY WITH STENT PLACEMENT  08/11/2017   "2 stents"   CORONARY STENT INTERVENTION N/A 08/11/2017   Procedure: CORONARY STENT INTERVENTION;  Surgeon: Lennette Bihari, MD;  Location: [facility] INVASIVE CV LAB;  Service: Cardiovascular;  Laterality: N/A;   CYSTOSCOPY W/ STONE MANIPULATION     ESOPHAGOGASTRODUODENOSCOPY (EGD) WITH PROPOFOL N/A 05/24/2022   Procedure: ESOPHAGOGASTRODUODENOSCOPY (EGD) WITH PROPOFOL;  Surgeon: Imogene Burn, MD;  Location: [facility] ENDOSCOPY;  Service: Gastroenterology;  Laterality: N/A;   GIVENS CAPSULE STUDY N/A 05/25/2022   Procedure: GIVENS CAPSULE STUDY;  Surgeon: Imogene Burn, MD;  Location: [facility] ENDOSCOPY;  Service: Gastroenterology;  Laterality: N/A;   INGUINAL HERNIA REPAIR Left    INSERT / REPLACE / REMOVE PACEMAKER  12/09/2011   SJM Accent DR RF implanted by DR Allred for complete heart block and syncope   JOINT REPLACEMENT     LAPAROSCOPIC CHOLECYSTECTOMY     LEAD REVISION/REPAIR N/A 08/09/2020   Procedure: LEAD REVISION/REPAIR;  Surgeon: Hillis Range, MD;  Location: [facility] INVASIVE CV LAB;  Service: Cardiovascular;  Laterality: N/A;   LITHOTRIPSY     LUMBAR DISC SURGERY     "removed arthritis and spurs"   PERMANENT PACEMAKER INSERTION N/A 12/09/2011   Procedure: PERMANENT PACEMAKER INSERTION;  Surgeon: Hillis Range, MD;  Location: [facility] CATH LAB;  Service: Cardiovascular;  Laterality: N/A;   PPM GENERATOR CHANGEOUT N/A 08/09/2020   Procedure: PPM GENERATOR CHANGEOUT;  Surgeon: Hillis Range, MD;  Location: [facility] INVASIVE CV LAB;  Service: Cardiovascular;  Laterality: N/A;   PROSTATE SURGERY     REPLACEMENT TOTAL KNEE Right    RIGHT HEART CATH N/A 05/29/2021   Procedure: RIGHT HEART CATH;  Surgeon: Lennette Bihari, MD;  Location: [facility] INVASIVE CV LAB;  Service: Cardiovascular;  Laterality:  N/A;   RIGHT/LEFT HEART CATH AND CORONARY ANGIOGRAPHY N/A 08/11/2017   Procedure: RIGHT/LEFT HEART CATH AND CORONARY ANGIOGRAPHY;  Surgeon: Laurey Morale, MD;  Location: [facility] INVASIVE CV LAB;  Service: Cardiovascular;  Laterality: N/A;   TRANSURETHRAL RESECTION OF PROSTATE  2017/2018    Home Medications:  Allergies as of 01/21/2023       Reactions   Ace Inhibitors Swelling   Tongue swelling, angioedema   Lisinopril    Other reaction(s): Lip swelling, O/E - lip swelling   Celecoxib Rash   Skin rash        Medication List        Accurate as of January 21, 2023  3:05 PM. If you have any questions, ask your nurse or doctor.          acetaminophen 500 MG tablet Commonly known as: TYLENOL Take 500 mg by mouth as needed for moderate pain.   allopurinol 300 MG tablet Commonly known as: ZYLOPRIM Take 300 mg by mouth daily.   apixaban 5 MG Tabs tablet Commonly known as: ELIQUIS Take 1 tablet (5 mg total) by mouth 2 (two) times daily.  BERBERINE CHLORIDE PO Take 1,500 mg by mouth.   colchicine 0.6 MG tablet Take 0.6 mg by mouth daily.   diltiazem 120 MG 24 hr capsule Commonly known as: CARDIZEM CD Take 1 capsule (120 mg total) by mouth daily.   Gemtesa 75 MG Tabs Generic drug: Vibegron Take 1 tablet (75 mg total) by mouth daily. Started by: Riki Altes   leflunomide 10 MG tablet Commonly known as: ARAVA Take 1 tablet by mouth daily.   megestrol 20 MG tablet Commonly known as: MEGACE Take 20 mg by mouth daily.   nebivolol 5 MG tablet Commonly known as: BYSTOLIC Take 5 mg by mouth daily.   nitroGLYCERIN 0.4 MG SL tablet Commonly known as: NITROSTAT Place 1 tablet (0.4 mg total) under the tongue every 5 (five) minutes as needed for chest pain.   OMEGA-3 FISH OIL PO Take 1 capsule by mouth daily.   pravastatin 20 MG tablet Commonly known as: PRAVACHOL Take 1 tablet (20 mg total) by mouth every evening.   telmisartan 80 MG tablet Commonly known as:  MICARDIS Take 1 tablet (80 mg total) by mouth daily.   thyroid 90 MG tablet Commonly known as: ARMOUR Take 90 mg by mouth every morning.   triamcinolone ointment 0.5 % Commonly known as: KENALOG Apply 1 Application topically 2 (two) times daily.   TURMERIC PO Take 720 mg by mouth.   Vtama 1 % Crea Generic drug: Tapinarof APPLY 1 APPLICATION TOPICALLY DAILY        Allergies:  Allergies  Allergen Reactions   Ace Inhibitors Swelling    Tongue swelling, angioedema   Lisinopril     Other reaction(s): Lip swelling, O/E - lip swelling   Celecoxib Rash    Skin rash     Family History: Family History  Problem Relation Age of Onset   Pancreatic cancer Mother    Cancer Mother    Bone cancer Father    Cancer Father    Alzheimer's disease Sister    Heart attack Paternal Uncle    Arthritis Maternal Grandfather    Alzheimer's disease Paternal Grandmother    Lung cancer Paternal Grandfather     Social History:  reports that he quit smoking about 50 years ago. His smoking use included cigarettes. He started smoking about 55 years ago. He has a 5 pack-year smoking history. He quit smokeless tobacco use about 49 years ago.  His smokeless tobacco use included chew. He reports that he does not currently use alcohol after a past usage of about 1.0 standard drink of alcohol per week. He reports that he does not use drugs.   Physical Exam: BP (!) 144/75   Ht 5\' 5"  (1.651 m)   Wt 171 lb (77.6 kg)   BMI 28.46 kg/m   Constitutional:  Alert and oriented, No acute distress. HEENT: [place] AT, moist mucus membranes.  Trachea midline, no masses. Psychiatric: Normal mood and affect.   Pertinent Imaging: KUB performed earlier today was personally reviewed and interpreted and there is a moderate amount of stool and bowel gas obscuring the renal outlines, making calcifications previously identified on CT visible. There is a faint calcification overlying the upper pole of the left renal  outline.   Laboratory Data: Urinalysis Dipstick/microscopy negative.   Assessment & Plan:    1. BPH with detrusor overactivity Continue Tropsium. With the recent increase in nighttime incontinence, we'll add Gemtesa 75 mg daily-samples given. He will call back regarding efficacy.  2. Bilateral nephrolithiasis Suboptimal KUB today,  though faint calcification is identified overlying the superior portion of the left adrenal outline. Continue annual follow up.  I have reviewed the above documentation for accuracy and completeness, and I agree with the above.   Riki Altes, MD  [facility] [address], [address] [place], [place] [address] [phone]

## 2023-01-21 NOTE — Progress Notes (Signed)
Spiro/DLCO performed today. 

## 2023-01-21 NOTE — Patient Instructions (Signed)
Spiro/DLCO performed today. 

## 2023-01-22 ENCOUNTER — Ambulatory Visit (INDEPENDENT_AMBULATORY_CARE_PROVIDER_SITE_OTHER): Payer: Medicare Other | Admitting: Internal Medicine

## 2023-01-22 ENCOUNTER — Encounter: Payer: Self-pay | Admitting: Internal Medicine

## 2023-01-22 VITALS — BP 136/64 | HR 82 | Temp 98.5°F | Ht 65.5 in | Wt 179.6 lb

## 2023-01-22 DIAGNOSIS — Z77098 Contact with and (suspected) exposure to other hazardous, chiefly nonmedicinal, chemicals: Secondary | ICD-10-CM

## 2023-01-22 DIAGNOSIS — R634 Abnormal weight loss: Secondary | ICD-10-CM | POA: Diagnosis not present

## 2023-01-22 DIAGNOSIS — I83891 Varicose veins of right lower extremities with other complications: Secondary | ICD-10-CM

## 2023-01-22 DIAGNOSIS — J84112 Idiopathic pulmonary fibrosis: Secondary | ICD-10-CM

## 2023-01-22 DIAGNOSIS — R432 Parageusia: Secondary | ICD-10-CM | POA: Diagnosis not present

## 2023-01-22 NOTE — Patient Instructions (Addendum)
  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  Hx of covid March 2023 - did not affect your fibrosis.  But recently stable.   Plan - -Continue monitor without pirfenidone for now  - due to side effects, current ivermectin  --Do spirometry and DLCO in 6  months and return for follow-up  - if progressive consider ofev or clinical trials  Dysgeusia Unintentional weight loss - despite stopping esbriet early April 2024 Fatigue  -Symptoms are all resolving in the last 3-4  months after stopping pirfenidone but respect your judgment that this could be because of C. difficile or positive treatment effect of ivermectin   Plan - Continue to hold pirfenidone but open to restarting it once ivermectin course is completed -No more pirfenidone   - cma to mark this as allregy   Varicose veins Right lower  Plan  - talk to PCP Cameron Arbour, MD= =about this   Followup - 6 months return to see Dr. Marchelle [name] ; 30-minute visit but after spirometry and DLCO  -Symptom score and exercise hypoxemia test at

## 2023-01-22 NOTE — Progress Notes (Signed)
PCP Cameron Arbour, MD  HPI   IOV 07/10/2017  Chief Complaint  Patient presents with   Advice Only    Referred by [facility] due to SOB.  PFT done 05/28/17.  Pt has been having issues with SOB x4 months especially with exertion and has some mild chest tightness. Denies any cough.    82 year old male referred by Dr. Johney [name] for evaluation of shortness of breath after cardiac etiologies ruled out.  He tells me that he is a remote smoker.  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  He has a long-standing history of rheumatoid arthritis followed by Dr. Gavin [name] in [place].  He is to be on methotrexate for many years and stopped taking it because of cardiac dysfunction [he personally is convinced that methotrexate because this].  He was then on leflunomide as of 2017 but is currently not on it.  His last rheumatoid factor and CCP antibodies were negative on my personal chart review of the outside records in 2016.    Now for the last 3 or 6 months he is got insidious onset of shortness of breath that is slowly progressive.  His dyspnea on exertion relieved by rest.  Class II-3 activities.  There I  s no associated cough or orthopnea proximal nocturnal dyspnea.  He did have some edema but this got cleared up but the dyspnea is continuing to get worse.  In the last few months he has had a cardiac echo that showed pulmonary hypertension.  Did have cardiac stress test that is normal.  Had pulmonary function test that showed isolated reduction in diffusion capacity and therefore he has been referred here.  Walking desaturation test on 07/10/2017 185 feet x 3 laps on ROOM AIR:  did NOT desaturate. Rest pulse ox was 100%, final pulse ox was 98%. HR response 60/min at rest to 121/min at peak exertion. Patient Cameron [name]  Did not Desaturate < 88% . Cameron [name] did not  Desaturated  </= 3% points. Cameron [name] yes did get tachyardic   OV 08/04/2017  Chief Complaint  Patient presents with   Follow-up    ILD    Follow-up interstitial lung disease workup  After the last visit no interim problems.  He has some chronic pedal edema that he will talk to about with his primary care physician.  He did see Dr. Gavin [name] rheumatologist in July 15, 2017.  I reviewed his notes.  It is not specifically indicate patient has nonspecific seronegative arthritis with a differential diagnosis of seronegative rheumatoid arthritis versus psoriatic.  Patient still seems to think the root of all his problems is the methotrexate he took for over 10 years.  At this point in time there is no decompensation.  As part of the ILD workup his pulmonary function test shows mild reduction in diffusion capacity.  Correlating with this is evidence of pulmonary hypertension on the echo and high-resolution CT chest enlarged pulmonary arteries.  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  His autoimmune panel is negative.  And so the vasculitis panel and hypersensitivity pneumonitis panel.    IMPRESSION: 1. 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 Early/mild usual interstitial pneumonitis is not excluded. 2. Aortic atherosclerosis (ICD10-170.0). Coronary artery calcification. 3. Enlarged pulmonary arteries, indicative of pulmonary arterial Hypertension.- > in echo  Nov 2018: Pulmonary arteries: Systolic pressure was moderately increased.   PA peak pressure: 55 mm Hg (S). 4. Left renal stone, partially imaged.     Electronically Signed   By: Cameron [name] M.D.   On: 07/22/2017 15:03  OV 11/03/2017  Chief Complaint  Patient presents with   Follow-up    Pt has SOB with  exertion, some dry cough.     Follow-up suspected interstitial lung disease in the setting of rheumatoid arthritis and long methotrexate intake  He presents with his wife.  At the time of last visit I was not fully convinced that he had interstitial lung disease.  His CT scan indicated presence of pulmonary hypertension and so did his echocardiogram.  Therefore I referred him back to Dr. Johney [name] cardiology.  In the spring 2019 he did have a right heart catheterization and left heart catheterization that showed mild pulmonary hypertension but also coronary artery blockage.  He status post 2 stents.  After that his shortness of breath improved but he tells me overall his fatigue level has not improved.  In talking to him I find out that he exercises 5 times a week doing heart track 2 times a week and the other 3 days walking a mile each time and doing weight lifting.  It appears that he takes a 20-minute nap after these exercises and then feels reenergized.  His mother feels that he does not have the effort tolerance as his younger days but he denies having any symptoms of chest pain or shortness of breath or cough when he does these heavy exertion the weight lifting or walking a mile out doing heart track.  In fact in the walking desaturation test today he walked extremely fast and had no problems.  In terms of his possible interstitial lung disease he had pulmonary function test today and felt to show some improvement and on exam he does not have any crackles.  Also his wife tells me that for the last 2 months he is using CPAP for sleep apnea and this is also helping him.  Right Heart Pressures R[facility] Procedural Findings: Hemodynamics (mmHg) RA mean 2 RV 42/6 PA 42/8, mean 21 PCWP mean 7 LV 134/8 AO 147/52       OV 06/01/2019  Subjective:  [name], male , DOB: [birth year] , age 82 y.o. , MRN: [id number] , ADDRESS: [phone] [place] [address]   06/01/2019 -   Chief  Complaint  Patient presents with   Follow-up    Pt states he has been doing okay since last visit but states he has been having a little more SOB x4 weeks now. Pt also has occ coughing with yellow phlegm.   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 -returns for follow-up.  I personally have not seen him since the summer 2019.  He says overall he has been stable.  In August 2020 he had a CT scan of the chest that confirmed the presence of interstitial lung disease in the setting of his rheumatoid arthritis.  He was stable.  His pulmonary function test was stable.  He says now in the last 2 months has had a decline in shortness of breath.  There  is also some cough with sputum production but that has resolved.  It is definitely present with exertion but relieved by rest.  His walking desaturation test compared to 18 months ago is roughly the same except that he is very tachycardic.  He does have a pacemaker.  He has an appoint with Dr. Johney [name] his electrophysiologist today.  His symptom scores are listed below.     ROS - per HPI     OV 08/01/2019 - telephine visit - identified with 2 person identifier. Telephone visit - limits, risks benefits explained  Subjective:  [name], male , DOB: November 25, 1940 , age 48 y.o. 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 - similar dyspnea compared to [facility] 2021.  No better nor worse.  After last visit he has seen cardiology x2.  It appears the final conclusion is that diastolic dysfunction might be contributing to his shortness of breath.  But overall not major changes in his cardiac care.  He tells me that overall he is stable.  He had spirometry and DLCO the DLCO itself appears to be  reduced compared to the recent 1 but stable compared to older ones.  The FVC suggest decline.  Patient himself feels stable.  Overall some mixed picture.  Last high-resolution CT chest was October 2020.    IMPRESSION: 1. 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 There is no significant air trapping on expiratory phase imaging. Findings are not significantly changed compared to prior examinations and remain in an "indeterminate for UIP" pattern by ATS pulmonary fibrosis criteria, differential considerations including both UIP and NSIP.   2.  Coronary artery disease and aortic atherosclerosis.   3.  Left nephrolithiasis.     Electronically Signed   By: Lauralyn Primes M.D.   On: 01/12/2019 15:08    OV 10/17/2019  Subjective:  [name], male , DOB: 1940/07/15 , age 72 y.o. , MRN: [id number] , ADDRESS: [phone] [place] [address]   10/17/2019 -   Chief Complaint  Patient presents with   Follow-up    pt states sobwhen doing activities.   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 -presents for follow-up after having his spirometry and DLCO and high-resolution CT chest.  Overall he feels stable compared to the last visit but he says definitely compared to 2 years ago his symptoms are worse.  Compared to 1 year ago his symptoms are the same.  He is kind of leery of taking new medications.  His high-resolution CT scan of the chest indicates mild progression since 2019 February.  His pulmonary function tests also indicate progression compared to 2 years ago but fluctuant in more recent times.  He is reporting agent orange exposure and is  wondering if his ILD could be related to that.  He reminded me that he is already on the ILD-pro registry study.  His next scheduled visit is in October 2021.   High-resolution CT chest May 2021 Lungs/Pleura: Peripheral and basilar predominant subpleural interlobular and intra lobular  septal thickening and ground-glass. Findings persist on prone imaging and appear similar to 01/12/2019 but may be minimally progressive from 07/22/2017. 4 mm peripheral left lower lobe nodule (14/101), stable from 07/22/2017 and considered benign. Lungs are otherwise clear. No pleural fluid. Airway is unremarkable. Mild air trapping.   Upper Abdomen: Visualized portions of the liver and adrenal glands are unremarkable. Stones are seen in the kidneys bilaterally. Spleen and visualized portions of the pancreas, stomach and bowel are grossly unremarkable. Cholecystectomy. No upper abdominal adenopathy.   Musculoskeletal: Degenerative changes in the spine. No worrisome lytic or sclerotic lesions.   IMPRESSION: 1. Pulmonary parenchymal pattern of fibrosis appears grossly stable from 01/12/2019 but may be minimally progressive from 07/22/2017. 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 Usual interstitial pneumonitis is certainly not excluded. Findings are indeterminate for UIP per consensus guidelines: Diagnosis of Idiopathic Pulmonary Fibrosis: An Official ATS/ERS/JRS/ALAT Clinical Practice Guideline. Am Rosezetta Schlatter Crit Care Med Vol 198, Iss 5, [id number], Jan 24 2017. 2. Bilateral renal stones. 3. Aortic atherosclerosis (ICD10-I70.0). Coronary artery calcification. 4. Enlarged pulmonic trunk, indicative of pulmonary arterial hypertension.     Electronically Signed   By: Cameron [name] M.D.   On: 10/10/2019 14:00  ROS - per HPI     OV 06/29/2020  Subjective:  [name], male , DOB: 03/21/1941 , age 15 y.o. , MRN: [id number] , ADDRESS: [address] [place] [address] PCP Cameron Arbour, MD Patient Care Team: Cameron Arbour, MD as PCP - General (Unknown Physician Specialty) Hillis Range, MD as PCP - Electrophysiology (Cardiology) Lennette Bihari, MD as PCP - Cardiology (Cardiology) Quintella Reichert, MD as PCP - Sleep Medicine (Cardiology)  This Provider for this visit: Treatment Team:  Attending Provider: Kalman Shan, MD    06/29/2020 -   Chief Complaint  Patient presents with   Follow-up    SOB unchanged, slight nonproductive cough. Esbriet doing well.   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 Normal echo Jan 2021   HPI Cameron [name] 82 y.o. -returns for follow-up.  He presents with his wife.  Last seen in May 2021.  After that in June 2021 when he started pirfenidone for progressive ILD.  He tells me that he has been tolerating pirfenidone just fine.  Of note he has not had any liver function test since he started pirfenidone.  He is not having any GI side effects of skin side effects from the pirfenidone.  In the last 6 months his ILD stable according to his history.  His walking desaturation test and symptom scores are stable.  He is up-to-date with his Covid vaccine.  He is on leflunomide.  He is on the ILD-pro registry study with a last visit was in November 2021.  He needs a visit every 6 months.  He is willing to get a Covid IgG checked because he is immunosuppressed.  This is in response to humoral immunity to vaccine     OV 04/09/2021  Subjective:  [name], male , DOB: [birth year] , age 31 y.o. 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 Normal echo Jan 2021  HPI Cameron [name] 82 y.o. -returns for follow-up.  He tells me that in the last 6 months since his last visit he is having progressively more shortness of breath with exertion relieved by rest.  In fact his dyspnea score shows worsening to a total score of 9 compared to earlier in the year.  But there is no worsening cough.  He had pulmonary function test that shows continued stability since 2021 feb but definitely worse since 2019 and 2020.  In other words the stability correlates with him starting pirfenidone.  Explained to him that the pirfenidone is helping his ILD stability.  Nevertheless his symptoms are worse.  He had echocardiogram yesterday that shows continued elevation in pulmonary artery pressures [2019 borderline elevation] and grade 2 diastolic dysfunction.  Of note he had his ILD-Pro registry research visit today. Last liver function test for drug-induced liver injury monitoring was in February 2022.  GFR at that time was 57.  In March 2022.  His wife has suspected ILD and she is also with him in this visit.  I saw her for a scheduled visit just earlier.      OV  06/13/2021  Subjective:  [name], male , DOB: 1940-07-22 , age 61 y.o. , MRN: [id number] , ADDRESS: [address] [place] [address] PCP Cameron Arbour, MD Patient Care Team: Cameron Arbour, MD as PCP - General (Unknown Physician Specialty) Hillis Range, MD as PCP - Electrophysiology (Cardiology) Lennette Bihari, MD as PCP - Cardiology (Cardiology) Quintella Reichert, MD as PCP - Sleep Medicine (Cardiology)  This Provider for this visit: Treatment Team:  Attending Provider: Kalman Shan, MD    06/13/2021 -   Chief Complaint  Patient presents with   Follow-up    Pt is following up after recent heart cath.    HPI KODI RININGER 82 y.o. -returns for follow-up to discuss the implications of the right heart cath results.  He had right heart cath on 05/29/2021.  Around this time he also had some mild anemia that is documented below.  But his recent creatinine is normal.  His right heart cath shows worsening of mean arterial pressure compared to a few years ago but his PVR is low and his wedge is high.  He also has moderate mitral regurgitation and had a high V wave.  Compared to last visit he stable but over time he said worsening dyspnea.  He is here with his wife.  I told them both that this looks like pulmonary venous hypertension.  He does not have edema.  We discussed the possibility of doing Lasix  therapy.  I told him he does not meet official criteria for inhaled treprostinil and WHO group 3 pulmonary hypertension.  For inhaled treprostinil he would need a wedge less than 15 and a PVR greater than 3.  He does not have that.  He is agreed to do Lasix at this point.  He will have to follow-up with Dr. Nicki Guadalajara for this.  In terms of his interstitial lung disease he has a surveillance CT scan coming up in March 2023 [last CT was then May 2021].  I think we will postpone this and just see him in 4 to 6 months for ILD follow-up given recent stability and  pulmonary function test.  What he needs is follow-up for his shortness of breath and to see how the Lasix is performing.  For this we will make a nurse practitioner visit and also encouraged him to go and see [name].  CT Chest data  No results found.   07/11/2021: Today - follow up Patient presents today with wife via virtual visit for follow up after being started on lasix for mild pulmonary HTN. He states he started the medication but has since stopped due to some bladder issues. He reports his breathing has been stable and overall, he feels pretty well. He denies any lower extremity swelling, palpitations, orthopnea, PND, cough or wheezing. He is doing well on Esbriet. He has developed a rash over the last 3-4 days after being transitioned from metformin to Sorgho. He denies any facial or tongue swelling, difficulties breathing or swallowing, or N/V.     OV 10/01/2021  Subjective:  [name], male , DOB: 01-Dec-1940 , age 53 y.o. , MRN: [id number] , ADDRESS: [address] [place] [address] PCP Cameron Arbour, MD Patient Care Team: Cameron Arbour, MD as PCP - General (Unknown Physician Specialty) Hillis Range, MD as PCP - Electrophysiology (Cardiology) Lennette Bihari, MD as PCP - Cardiology (Cardiology) Quintella Reichert, MD as PCP - Sleep Medicine (Cardiology)  This Provider for this visit: Treatment Team:  Attending Provider: Kalman Shan, MD    10/01/2021 -   Chief Complaint  Patient presents with   Follow-up    PFT performed today.  Pt states he has been doing good since last visit and denies any complaints.    HPI SAJJAD BILLETT 82 y.o. -returns for routine follow-up.  After I saw him in January 2023 we gave him a trial of Lasix because of diastolic dysfunction and pulmonary venous hypertension but this did not help.  He denied acute video visit in March 2023 because of COVID-19.  He took antiviral.  Currently doing well except for  the fact that he has had increased fatigue compared to baseline [the symptom score].  The fatigue is significant especially by the afternoon.  Also for the last few weeks he has had diarrhea that is moderate in intensity.  There is no changes in his medications.  Over time his creatinine has gotten worse.  He does have chronic kidney disease and is seen his primary care.  He also tells me that he was taking potassium and recently has had hyperkalemia.  This was getting worse as of yesterday with a potassium of 6.1 mEq.  This was at outside facility and I was able to review the lab results.  His primary care is intently watching this.  He is off his diuretics and potassium supplementation.  I have instructed him to have  repeat potassium today.  In terms of his ILD it is stable.  Pulmonary function tests are stabilized.  His CT scan of the chest was reviewed and this shows a stable ILD in the last 2 years.  Unclear to me if he is still on leflunomide or not. He is supposed have a research registry follow-up visit today.  Creatinine profile shows a slow worsening over time.          OV 11/27/2021  Subjective:  [name], male , DOB: 1941/03/07 , age 24 y.o. , MRN: [id number] , ADDRESS: [address] [place] [address] PCP Cameron Arbour, MD Patient Care Team: Cameron Arbour, MD as PCP - General (Unknown Physician Specialty) Hillis Range, MD as PCP - Electrophysiology (Cardiology) Lennette Bihari, MD as PCP - Cardiology (Cardiology) Quintella Reichert, MD as PCP - Sleep Medicine (Cardiology)  This Provider for this visit: Treatment Team:  Attending Provider: Kalman Shan, MD    11/27/2021 -   Chief Complaint  Patient presents with   Follow-up    Pt to discuss Esbriet. Pt still doing good since LOV.    HPI Cameron [name] 82 y.o. -returns for follow-up.  At the last visit in May 2023 because of rising creatinine we reduce his pirfenidone dose  particularly in the setting of fatigue.  It seems lowering the pirfenidone dose does not really help the fatigue but it does seem to overall make him feel better.  He is stable.  His dyspnea is better.  His diarrhea is very minimal to nonexistent.  He had pulmonary function test and this shows continued stability since being on pirfenidone.  He had ILD-Pro registry visit in the spring 2023.  His next visit is due sometime around October 2023.  We discussed his rising creatinine.  I advised him to stay on the lower dose of the pirfenidone.  This would be 2 pills 3 times daily.  He did indicate and his wife also indicated that he is having difficult time controlling his hyperkalemia.  Review of his medication shows that he is on ARB s [telmisartan].  Discussed this is a potential contributory etiology for his hyperkalemia.  He is willing to get his creatinine and his potassium checked today in the office.  But his primary care is the one who is managing that.   Noted he is on fish oil but he says this actually helps him with his arthritis.     OV 05/06/2022  Subjective:  [name], male , DOB: 1941-02-03 , age 36 y.o. , MRN: [id number] , ADDRESS: [address] [place] [address] PCP Cameron Arbour, MD Patient Care Team: Cameron Arbour, MD as PCP - General (Unknown Physician Specialty) Hillis Range, MD as PCP - Electrophysiology (Cardiology) Lennette Bihari, MD as PCP - Cardiology (Cardiology) Quintella Reichert, MD as PCP - Sleep Medicine (Cardiology) Duke, Roe Rutherford, PA as Physician Assistant (Cardiology)  This Provider for this visit: Treatment Team:  Attending Provider: Kalman Shan, MD    05/06/2022 -   Chief Complaint  Patient presents with   Follow-up    Pt states he has been doing okay since last visit and denies any complaints.     Marland Kitchen  HPI Cameron [name] 82 y.o. -returns for follow-up.  Presents with his wife.  He feels he is doing  stable.  Symptom score shows stability but to me he obviously looks  like he has lost weight.  He then told me that ever since COVID earlier in the year he is having dysgeusia and low appetite and therefore he is losing weight.  Review of the records indicate he is lost 20 pounds in the last 1 year.  I did indicate to him that concern of COVID causing his dysgeusia is pirfenidone could be causing his dysgeusia and low appetite and weight loss.  He is willing to give a short holiday and reassess.  He has not had pulmonary function test recently but he feels stable.  He is due for monitoring safety labs with his pirfenidone high risk medication.  He continues on immunosuppression.      OV 08/28/2022  Subjective:  [name], male , DOB: 10/21/1940 , age 56 y.o. , MRN: [id number] , ADDRESS: [address] [place] [id number] PCP Cameron Arbour, MD Patient Care Team: Cameron Arbour, MD as PCP - General (Unknown Physician Specialty) Hillis Range, MD (Inactive) as PCP - Electrophysiology (Cardiology) Lennette Bihari, MD as PCP - Cardiology (Cardiology) Quintella Reichert, MD as PCP - Sleep Medicine (Cardiology) [facility], Roe Rutherford, PA as Physician Assistant (Cardiology)  This Provider for this visit: Treatment Team:  Attending Provider: Kalman Shan, MD    08/28/2022 -   Chief Complaint  Patient presents with   Follow-up    Fatigue, sob  HPI Cameron [name] 82 y.o. -last seen in December 2023.  At that time he was having dysgeusia after COVID.  Also having weight loss.  We do not know if pirfenidone was involved so told him to stop it for a few weeks which she did but it did not really resolve his symptoms.  He presents for follow-up but it appears in January 2024 [actually between 05/23/2022 - 05/30/2022] he was admitted with melena and upper GI bleed got 3 units of blood.  Extensive endoscopy colonoscopy and camera test were all negative.  Since then they have been  monitoring his hemoglobin at home and it is improved.  He did follow-up with Dr. Sherin Quarry on 07/01/2022 at GI clinic.  He states ever since then he feels a lot more fatigue despite improvement in hemoglobin he is more short of breath.  His mood is also low.  He has been started by the [facility] yesterday/3/24 and Zoloft but he denies being sad.  Although depression has been diagnosed.  Definitely not suicidal.  He is unable to understand his fatigue.  Wife also affirms the same.  Review of his labs included 07/01/2022 low vitamin D but is not on supplements and abnormal TSH in 2018 which she believes has not been rechecked.  He believes he is lost weight but according to our indicators his weight is stable  His last echocardiogram was in November 2022 And his last high-resolution CT chest was in May 2023   OV 09/25/2022  Subjective:  [name], male , DOB: May 13, 1941 , age 52 y.o. , MRN: [id number] , ADDRESS: [address] [place] [address] PCP Cameron Arbour, MD Patient Care Team: Cameron Arbour, MD as PCP - General (Unknown Physician Specialty) Hillis Range, MD (Inactive) as PCP - Electrophysiology (Cardiology) Lennette Bihari, MD as PCP - Cardiology (Cardiology) Quintella Reichert, MD as PCP - Sleep Medicine (Cardiology) [facility], Roe Rutherford, PA as Physician Assistant (Cardiology)  This Provider for this visit: Treatment Team:  Attending Provider: Kalman Shan,  MD    09/25/2022 -   Chief Complaint  Patient presents with   Follow-up    F/up prescription, weight loss     HPI Cameron [name] 82 y.o. -returns for follow-up.  I saw him a month ago for his weight loss and dysgeusia.  We had him stop pirfenidone.  He stopped it he is not taking it for the last 1 month.  His wife is here with him and she is independent historian.  They are alarmed of the weight loss there is more weight loss is at least 20 pounds of weight loss in the last 1 month.   Yesterday was started on Aldactone for his new onset heart failure which we diagnosed after his last visit.  He saw Dr. Nicki Guadalajara for that.  He is also got an iron infusion yesterday for his unexplained anemia.  Review of medication shows that he is on Areva which can cause weight loss.  He temporarily stopped it 2 weeks ago but restarted it 2 days ago.  He restarted it because his arms were hurting.  He is also on Jardiance for the last 2 years and this correlates with the time.  his weight loss although he was on pirfenidone at the same time but the fact that he is losing weight despite coming off pirfenidone.  Did simple exercise hypoxemia test.  I made him do a sit/stand test for ten reps.  He did not desaturate   Last Weight  Most recent update: 09/25/2022  1:03 PM    Weight  71.8 kg (158 lb 6.4 oz)                OV 11/20/2022  Subjective:  [name], male , DOB: 11-Feb-1941 , age 21 y.o. , MRN: [id number] , ADDRESS: [address] [place] [address] PCP Cameron Arbour, MD Patient Care Team: Cameron Arbour, MD as PCP - General (Unknown Physician Specialty) Lennette Bihari, MD as PCP - Cardiology (Cardiology) Quintella Reichert, MD as PCP - Sleep Medicine (Cardiology) Lanier Prude, MD as PCP - Electrophysiology (Cardiology) [facility], Roe Rutherford, PA as Physician Assistant (Cardiology)  This Provider for this visit: Treatment Team:  Attending Provider: Kalman Shan, MD    11/20/2022 -   Chief Complaint  Patient presents with   Follow-up    F/up, no complaints.     HPI Cameron [name] 82 y.o. -returns for follow-up.  Presents with his wife wife is an independent historian.  She gives most of the history.  But they both aligned at the information.   #Symptoms of weight loss, dysgeusia and fatigue  He is now off pirfenidone since early April 2024.  He states that his taste is improved.  He also tells me that his weight loss is  resolving [see below and it actually is resolving.].  His fatigue is also improving.  The temporal events related to his improvement in fatigue and weight weight loss and also loss of taste is the following   -Holding pirfenidone since early April 2024  -Diagnosis of systolic heart failure on the echocardiogram April 2021 starting Aldactone by Dr. Nicki Guadalajara   -Saw primary care Aram Beecham and review of the records indicate 10/29/2022 he had C. difficile toxin positivity on stool test.  In fact even before this on GI visit 10/16/2022 with Dr. Leonides Schanz she noticed that outside he had a test called GI 360 with life extension which showed  positive C. difficile toxin and high fecal calprotectin.  He took Dificid course  -Also during this time he has seen 1 Dr. Estil Daft in [place] was an MD but practices homeopathy.  2 weeks ago he was started on ivermectin and he feels within 2 days his symptoms started improving.  -Of all these measures he feels ivermectin is the one that helped him the most.   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  In fact symptom scores are stable.  Excess hypoxemia test is stable.  He has not had pulmonary function test today at this visit.  The stability is compared to the last visit.  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  This because when I first met him his chart review at [facility] rheumatology could not in clinic suggested he had seronegative rheumatoid arthritis.  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  This diagnosis was given at sometime in the past.  I do not know about this.  He is on Areva for this.  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  However there have been no impact to the management.     OV 01/22/2023  Subjective:  [name], male , DOB: 06/13/1940 , age 26 y.o. , MRN: [id number] , ADDRESS: [address] [place] [address] PCP Cameron Arbour, MD Patient Care Team: Cameron Arbour, MD as PCP - General (Unknown Physician Specialty) Lennette Bihari, MD as PCP - Cardiology (Cardiology) Quintella Reichert, MD as PCP - Sleep Medicine (Cardiology) Lanier Prude, MD as PCP - Electrophysiology (Cardiology) [facility], Roe Rutherford, PA as Physician Assistant (Cardiology)  This Provider for this visit: Treatment Team:  Attending Provider: Kalman Shan, MD   Arthritis - > do [facility] clinic rheumatology -2019 notes at [facility] clinic suggest seronegative rheumatoid arthritis ` -11/18/2022 given a diagnosis of psoriatic arthritis by Dr. 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  However and noticed that [facility] rheumatology on 11/18/2022 gave your diagnosis of psoriatic arthritis.  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  However this does not impact current treatment recommendation.) History of agent orange exposure   -on Arava,  ? Pn med list May 2023  = Started pirfenidone June 2021 -> held April 2024 secondary to weight loss and dysgeusia and fatigue.  - ILD-Pro registry stdy  - last visit  04/09/2021  - Last HRCT May 2021 -. May 2023 without change (indeterminate)   RHC 05/29/21 - Mild pulmonary hypertension in this patient with interstitial lung disease with elevated RV 54/12; PA pressure 55/18; mean 31 mmHg.  PVR 2.6 WU. PW mean pressure is  17 mmHg, with V wave 24 c/w his echo documented mild - moderate mitral regurgitation.   CKD -  -07/13/2019: Creatinine 1.2 mg percent -05/23/2021 creatinine 1.15 mg percent  - 06/13/21 creat 1.6mg %, GFR 39 - 09/30/21 creat 1.30m GFR 42 - 10/16/21 - creat 1.84mg % - GFR 34 -11/27/2021: Creatinine 1.31 mg percent\\ - 05/30/22 - cret 0.95mg %  Mild COVID early 2023  Admission for blood loss anemia -without identifiable source in January 2024 status post 3 units of blood  -Started iron infusion Sep 24, 2022 with Dr. Orlie Dakin  -Hemoglobin 11.7 g% 08/31/2022.   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 Nicki Guadalajara Sep 24, 2022  Norovirus infection 2018 Incidental/atypical C. difficile with high calprotectin -.  In May 2024   Unintentional weight loss as below   - in symptom chart  - esbriet and improved with ivermectin - 2024  01/22/2023 -   Chief Complaint  Patient presents with   Follow-up    Review PFT.  Cough x 2 weeks.  Patient states he had a cold 2 weeks ago.     HPI Cameron [name] 82 y.o. -returns for follow-up.  Presents with his wife Chip Boer she is an independent historian.  They both tell me that he continues with his ivermectin.  He has 1 week left on it.  He has now regained his weight completely.  He is back to baseline.  Dysgeusia is almost gone.  He attributes this to ivermectin and also stopping the pirfenidone.  Pulmonary function test and it is stable.  Symptoms are stable.  Recently had a cold wife also had a cold he is coughing because of that.  But it is resolving.  He does  not want to take antibiotic and prednisone for this.  He brought to my attention right lower extremity.  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  I gave him some advice on that.       SYMPTOM SCALE - ILD 10/17/2019 Started pirfenidone June 2021 06/29/2020  04/09/2021 193# 10/01/2021 Esbriet lower dose since may 2023  186# 05/06/2022 Esbiret lowe rdose due to CKD  173# 08/28/2022 177# Esbiret low dose 09/25/2022 158# Off esbriet since April 2024 visit xxxxx 11/20/2022 160# - s/ p C diff Rx end/may 2024 and IVermetcin starting mide June 2024 01/22/2023 179# - supporitve care  O2 use ra ra ra ra ra ra ra ra  Shortness of Breath 0 -> 5 scale with 5 being worst (score 6 If unable to do)         At rest 0 0 0 0 1 2 0 0  Simple tasks - showers, clothes change, eating, shaving 1 1 0 0 0 2 0 0  Household (dishes, doing bed, laundry) 1 0 0 0 0 2 0 0  Shopping 0 0 1 0 1 2 0 2  Walking level at own pace 2 0 4 0 2 3 0 3  Walking up Stairs 3 3 4 3 3 4  2.5 4  Total (30-36) Dyspnea Score 7 4 9 3 7 15  2.5 9  How bad is your cough? x 1  0 2 0 0 3  How bad is your fatigue 4 2 2 4 2 5 3 3   How  bad is nausea 0 0 0 0 0 2 0 0  How bad is vomiting?  0 0 0 0 0 0 0 0  How bad is diarrhea? 1 0 0 0.5 0 5 0 0  How bad is anxiety? 1 1 1 2 1 2 2 2   How bad is depression 1 1  1 1 1 4 2 1         Simple office walk 185 feet x  3 laps goal with forehead probe 11/03/2017  06/01/2019  06/29/2020  04/09/2021  10/01/2021  09/25/2022  11/20/2022   O2 used Room air Room air  ra ra ra ra  Number laps completed 3 3  3      Comments about pace Fast very  99% and 66    Sit stand 10 tmes Sit stand x 15 tmes  Resting Pulse Ox/HR 100% and 70/min 99% and 66/min 97% and 71 100% and Pulse 77 100% and  HR 67 98% and HR 53 98% and HR 63  Final Pulse Ox/HR 98% and 121/min 98% and 120 min 98% and 121/min 99% and HR 114 97% and HR 112 98% and HR 110 97% an dHR 115  Desaturated </= 88% no     no    Desaturated <= 3% points no    Yes 3 pnts    Got Tachycardic >/= 90/min yes    yes    Symptoms at end of test none Mild dyspea No dyspnea Dyspnea at end Noe copmpaint Mild ydpnes   Miscellaneous comments veryu fast Mod pace fast Fast pace moderate      PFT     Latest Ref Rng & Units 01/21/2023   11:20 AM 08/25/2022   10:58 AM 10/01/2021   10:03 AM 04/17/2020    9:34 AM 10/13/2019    3:53 PM 07/21/2019    3:53 PM 01/17/2019   12:39 PM  ILD indicators  FVC-Pre L 2.77  P 2.80  3.27  3.05  3.08  2.92  3.41   FVC-Predicted Pre % 86  P 81  93  86  86  81  94   DLCO uncorrected ml/min/mmHg 18.55  P 20.12  18.84  19.38  19.58  20.08  21.58   DLCO UNC %Pred % 88  P 91  85  87  87  89  96   DLCO Corrected ml/min/mmHg 21.63  P 22.73  18.63  19.38  19.58  20.08    DLCO COR %Pred % 103  P 103  84  87  87  89      P Preliminary result      LAB RESULTS last 96 hours No results found.  LAB RESULTS last 90 days Recent Results (from the past 2160 hour(s))  CUP PACEART REMOTE DEVICE CHECK     Status: None   Collection Time: 11/06/22  2:00 AM  Result Value Ref Range   Date Time Interrogation Session [phone]    Pulse Generator Manufacturer [employer]    Pulse Gen Model 2272 Assurity MRI    Pulse Gen Serial Number [id number]    Clinic Name [facility]    Implantable Pulse Generator Type Implantable Pulse Generator    Implantable Pulse Generator Implant Date [id number]    Implantable Lead Manufacturer [facility]    Implantable Lead Model 5076 CapSureFix Novus    Implantable Lead Serial Number [id number]    Implantable Lead Implant Date 24097353    Implantable Lead Location Detail 1 UNKNOWN    Implantable  Lead Location [id number]    Implantable Lead Connection Status [id number]    Implantable Lead Manufacturer [facility]    Implantable Lead Model 1948 IsoFlex Optim    Implantable Lead Serial Number [id number]    Implantable Lead Implant Date 16109604    Implantable Lead Location [id number]    Implantable Lead  Connection Status [id number]    Lead Channel Setting Sensing Sensitivity 7.0 mV   Lead Channel Setting Sensing Adaptation Mode Fixed Pacing    Lead Channel Setting Pacing Amplitude 2.0 V   Lead Channel Setting Pacing Pulse Width 0.5 ms   Lead Channel Setting Pacing Amplitude 0.625    Lead Channel Status NULL    Lead Channel Impedance Value 400 ohm   Lead Channel Sensing Intrinsic Amplitude 2.6 mV   Lead Channel Pacing Threshold Amplitude 0.5 V   Lead Channel Pacing Threshold Pulse Width 0.5 ms   Lead Channel Status NULL    Lead Channel Impedance Value 610 ohm   Lead Channel Sensing Intrinsic Amplitude 12.0 mV   Lead Channel Pacing Threshold Amplitude 0.375 V   Lead Channel Pacing Threshold Pulse Width 0.5 ms   Battery Status MOS    Battery Remaining Longevity 94 mo   Battery Remaining Percentage 74.0 %   Battery Voltage 3.01 V   Brady Statistic RA Percent Paced 1.0 %   Brady Statistic RV Percent Paced 99.0 %   Brady Statistic AP VP Percent 89.0 %   Brady Statistic AS VP Percent 11.0 %   Brady Statistic AP VS Percent 1.0 %   Brady Statistic AS VS Percent 0 %  Basic metabolic panel     Status: Abnormal   Collection Time: 12/25/22 10:40 AM  Result Value Ref Range   Glucose 121 (H) 70 - 99 mg/dL   BUN 28 (H) 8 - 27 mg/dL   Creatinine, Ser 1.91 0.76 - 1.27 mg/dL   eGFR 71 >47 WG/NFA/2.13   BUN/Creatinine Ratio 27 (H) 10 - 24   Sodium 138 134 - 144 mmol/L   Potassium 5.4 (H) 3.5 - 5.2 mmol/L   Chloride 103 96 - 106 mmol/L   CO2 26 20 - 29 mmol/L   Calcium 10.1 8.6 - 10.2 mg/dL  Basic metabolic panel     Status: Abnormal   Collection Time: 01/12/23 12:02 PM  Result Value Ref Range   Sodium 134 (L) 135 - 145 mmol/L   Potassium 4.4 3.5 - 5.1 mmol/L   Chloride 105 98 - 111 mmol/L   CO2 22 22 - 32 mmol/L   Glucose, Bld 145 (H) 70 - 99 mg/dL    Comment: Glucose reference range applies only to samples taken after fasting for at least 8 hours.   BUN 29 (H) 8 - 23 mg/dL   Creatinine,  Ser 0.86 0.61 - 1.24 mg/dL   Calcium 9.1 8.9 - 57.8 mg/dL   GFR, Estimated >46 >96 mL/min    Comment: (NOTE) Calculated using the CKD-EPI Creatinine Equation (2021)    Anion gap 7 5 - 15    Comment: Performed at [facility], [address] [place], [place] [address]  CBC     Status: Abnormal   Collection Time: 01/12/23 12:02 PM  Result Value Ref Range   WBC 6.8 4.0 - 10.5 K/uL   RBC 3.28 (L) 4.22 - 5.81 MIL/uL   Hemoglobin 10.4 (L) 13.0 - 17.0 g/dL   HCT 41.3 (L) 24.4 - 01.0 %   MCV 95.4 80.0 - 100.0 fL   MCH  31.7 26.0 - 34.0 pg   MCHC 33.2 30.0 - 36.0 g/dL   RDW 62.6 94.8 - 54.6 %   Platelets 165 150 - 400 K/uL   nRBC 0.0 0.0 - 0.2 %    Comment: Performed at [facility], [address] [place], [place] [address]  Lactate Dehydrogenase (LDH)     Status: None   Collection Time: 01/20/23 10:01 AM  Result Value Ref Range   LDH 135 120 - 250 U/L  Folate     Status: None   Collection Time: 01/20/23 10:01 AM  Result Value Ref Range   Folate 21.9 >5.9 ng/mL  Vitamin B12     Status: None   Collection Time: 01/20/23 10:01 AM  Result Value Ref Range   Vitamin B-12 371 211 - 911 pg/mL  Haptoglobin     Status: None   Collection Time: 01/20/23 10:01 AM  Result Value Ref Range   Haptoglobin 178 43 - 212 mg/dL  IBC + Ferritin     Status: Abnormal   Collection Time: 01/20/23 10:01 AM  Result Value Ref Range   Iron 61 42 - 165 ug/dL   Transferrin 009.3 818.2 - 360.0 mg/dL   Saturation Ratios 99.3 (L) 20.0 - 50.0 %   Ferritin 248.7 22.0 - 322.0 ng/mL   TIBC 369.6 250.0 - 450.0 mcg/dL  Reticulocytes     Status: Abnormal   Collection Time: 01/20/23 10:01 AM  Result Value Ref Range   Retic Ct Pct 2.7 %   ABS Retic 91,800 (H) 25,000 - 90,000 cells/uL  Extra Specimen     Status: None   Collection Time: 01/20/23 10:01 AM  Result Value Ref Range   Extra tube recieved      Comment: An extra specimen was received with no test requested. The specimen will be  maintained in storage in case  additional testing is needed. Please call the client service department for further assistance. Marland Kitchen    Specimen type recieved Green Top   Pulmonary function test     Status: None (Preliminary result)   Collection Time: 01/21/23 11:20 AM  Result Value Ref Range   FVC-Pre 2.77 L   FVC-%Pred-Pre 86 %   FEV1-Pre 2.30 L   FEV1-%Pred-Pre 102 %   FEV6-Pre 2.77 L   FEV6-%Pred-Pre 93 %   Pre FEV1/FVC ratio 83 %   FEV1FVC-%Pred-Pre 116 %   Pre FEV6/FVC Ratio 100 %   FEV6FVC-%Pred-Pre 108 %   FEF 25-75 Pre 2.65 L/sec   FEF2575-%Pred-Pre 180 %   DLCO unc 18.55 ml/min/mmHg   DLCO unc % pred 88 %   DLCO cor 21.63 ml/min/mmHg   DLCO cor % pred 103 %   DL/VA 7.16 ml/min/mmHg/L   DL/VA % pred 967 %  Urinalysis, Complete     Status: Abnormal   Collection Time: 01/21/23  2:01 PM  Result Value Ref Range   Specific Gravity, UA 1.015 1.005 - 1.030   pH, UA 5.5 5.0 - 7.5   Color, UA Yellow Yellow   Appearance Ur Clear Clear   Leukocytes,UA Negative Negative   Protein,UA 1+ (A) Negative/Trace   Glucose, UA Negative Negative   Ketones, UA Negative Negative   RBC, UA Negative Negative   Bilirubin, UA Negative Negative   Urobilinogen, Ur 0.2 0.2 - 1.0 mg/dL   Nitrite, UA Negative Negative   Microscopic Examination See below:   Microscopic Examination     Status: Abnormal   Collection Time: 01/21/23  2:01 PM  Urine  Result Value Ref Range   WBC, UA 0-5 0 - 5 /hpf   RBC, Urine 0-2 0 - 2 /hpf   Epithelial Cells (non renal) >10 (A) 0 - 10 /hpf   Mucus, UA Present (A) Not Estab.   Bacteria, UA Moderate (A) None seen/Few  Bladder Scan (Post Void Residual) in office     Status: None   Collection Time: 01/21/23  2:09 PM  Result Value Ref Range   PVR 0.0 WU         has a past medical history of Anemia, Anxiety (self recent), Appendicitis, Atrial fibrillation (HCC) (12/12/2013), Bone cancer (HCC) (father  17), BPH (benign prostatic hypertrophy), CAD (coronary  artery disease) (12/2015), Chest pain (12/03/2015), CHF (congestive heart failure) (HCC), Complete heart block (HCC), Coronary artery disease, Coronary artery disease involving native coronary artery of native heart with unstable angina pectoris (HCC) (08/11/2017), D-dimer, elevated (04/03/2017), Depression (self recent), Drug reaction (07/11/2021), GERD (gastroesophageal reflux disease), History of blood transfusion (1968), History of kidney stones, History of SCC (squamous cell carcinoma) of skin (07/24/2020), Hyperglycemia (11/05/2013), Hyperlipidemia (11/05/2013), Hypertension, Hypothyroidism, Hypothyroidism, unspecified (11/05/2013), 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   reports that he quit smoking about 50 years ago. His smoking use included cigarettes. He started smoking about 55 years ago. He has a 5 pack-year smoking history. He quit smokeless tobacco use about 49 years ago.  His smokeless tobacco use included chew.  Past Surgical History:  Procedure Laterality Date   BACK SURGERY     BALLOON DILATION N/A 05/24/2022   Procedure: BALLOON DILATION;  Surgeon: Imogene Burn, MD;  Location: [facility] ENDOSCOPY;  Service: Gastroenterology;  Laterality: N/A;   CARDIAC CATHETERIZATION N/A 12/28/2015   Procedure: Left Heart Cath and Coronary Angiography;  Surgeon: Lyn Records, MD;  Location: [facility] INVASIVE CV LAB;  Service: Cardiovascular;  Laterality: N/A;   CATARACT EXTRACTION W/ INTRAOCULAR LENS  IMPLANT, BILATERAL Bilateral    COLONOSCOPY WITH PROPOFOL N/A 05/25/2022   Procedure: COLONOSCOPY WITH PROPOFOL;  Surgeon: Imogene Burn, MD;  Location: [facility] ENDOSCOPY;  Service: Gastroenterology;  Laterality: N/A;   CORONARY ANGIOPLASTY  WITH STENT PLACEMENT  08/11/2017   "2 stents"   CORONARY STENT INTERVENTION N/A 08/11/2017   Procedure: CORONARY STENT INTERVENTION;  Surgeon: Lennette Bihari, MD;  Location: [facility] INVASIVE CV LAB;  Service: Cardiovascular;  Laterality: N/A;   CYSTOSCOPY W/ STONE MANIPULATION     ESOPHAGOGASTRODUODENOSCOPY (EGD) WITH PROPOFOL N/A 05/24/2022   Procedure: ESOPHAGOGASTRODUODENOSCOPY (EGD) WITH PROPOFOL;  Surgeon: Imogene Burn, MD;  Location: [facility] ENDOSCOPY;  Service: Gastroenterology;  Laterality: N/A;   GIVENS CAPSULE STUDY N/A 05/25/2022   Procedure: GIVENS CAPSULE STUDY;  Surgeon: Imogene Burn, MD;  Location: [facility] ENDOSCOPY;  Service: Gastroenterology;  Laterality: N/A;   INGUINAL HERNIA REPAIR Left    INSERT / REPLACE / REMOVE PACEMAKER  12/09/2011   SJM [employer] DR RF implanted by DR Allred for complete heart block and syncope   JOINT REPLACEMENT     LAPAROSCOPIC CHOLECYSTECTOMY     LEAD REVISION/REPAIR N/A 08/09/2020   Procedure: LEAD REVISION/REPAIR;  Surgeon: Hillis Range, MD;  Location: [facility] INVASIVE CV LAB;  Service: Cardiovascular;  Laterality: N/A;   LITHOTRIPSY     LUMBAR DISC SURGERY     "removed arthritis and spurs"   PERMANENT PACEMAKER  INSERTION N/A 12/09/2011   Procedure: PERMANENT PACEMAKER INSERTION;  Surgeon: Hillis Range, MD;  Location: [facility] CATH LAB;  Service: Cardiovascular;  Laterality: N/A;   PPM GENERATOR CHANGEOUT N/A 08/09/2020   Procedure: PPM GENERATOR CHANGEOUT;  Surgeon: Hillis Range, MD;  [facility]: [facility] INVASIVE CV LAB;  Service: Cardiovascular;  Laterality: N/A;   PROSTATE SURGERY     REPLACEMENT TOTAL KNEE Right    RIGHT HEART CATH N/A 05/29/2021   Procedure: RIGHT HEART CATH;  Surgeon: Lennette Bihari, MD;  Location: [facility] INVASIVE CV LAB;  Service: Cardiovascular;  Laterality: N/A;   RIGHT/LEFT HEART CATH AND CORONARY ANGIOGRAPHY N/A 08/11/2017   Procedure: RIGHT/LEFT HEART CATH AND CORONARY ANGIOGRAPHY;  Surgeon: Laurey Morale, MD;  Location: [facility] INVASIVE CV LAB;   Service: Cardiovascular;  Laterality: N/A;   TRANSURETHRAL RESECTION OF PROSTATE  2017/2018    Allergies  Allergen Reactions   Ace Inhibitors Swelling    Tongue swelling, angioedema   Lisinopril     Other reaction(s): Lip swelling, O/E - lip swelling   Celecoxib Rash    Skin rash     Immunization History  Administered Date(s) Administered   Fluad Quad(high Dose 65+) 02/11/2020   Influenza Inj Mdck Quad Pf 03/25/2021   Influenza, High Dose Seasonal PF 03/26/2017, 02/11/2022   Influenza-Unspecified 05/03/2015, 02/13/2016, 03/26/2016, 03/19/2017, 03/03/2018, 03/17/2019, 04/09/2020, 04/18/2021   PFIZER Comirnaty(Gray Top)Covid-19 Tri-Sucrose Vaccine 06/11/2019, 07/02/2019, 07/04/2019, 07/18/2019, 03/01/2020, 11/15/2020   PFIZER(Purple Top)SARS-COV-2 Vaccination 06/11/2019, 07/04/2019, 07/18/2019, 03/01/2020   Pneumococcal Conjugate-13 02/11/2020   Pneumococcal Polysaccharide-23 12/10/2011, 01/30/2018   Pneumococcal-Unspecified 03/26/2016, 02/04/2018   Tdap 01/17/2016, 02/04/2018   Zoster Recombinant(Shingrix) 08/14/2020, 11/29/2020    Family History  Problem Relation Age of Onset   Pancreatic cancer Mother    Cancer Mother    Bone cancer Father    Cancer Father    Alzheimer's disease Sister    Heart attack Paternal Uncle    Arthritis Maternal Grandfather    Alzheimer's disease Paternal Grandmother    Lung cancer Paternal Grandfather      Current Outpatient Medications:    acetaminophen (TYLENOL) 500 MG tablet, Take 500 mg by mouth as needed for moderate pain., Disp: , Rfl:    allopurinol (ZYLOPRIM) 300 MG tablet, Take 300 mg by mouth daily., Disp: , Rfl:    apixaban (ELIQUIS) 5 MG TABS tablet, Take 1 tablet (5 mg total) by mouth 2 (two) times daily., Disp: 180 tablet, Rfl: 3   BERBERINE CHLORIDE PO, Take 1,500 mg by mouth., Disp: , Rfl:    colchicine 0.6 MG tablet, Take 0.6 mg by mouth daily., Disp: , Rfl:    diltiazem (CARDIZEM CD) 120 MG 24 hr capsule, Take 1 capsule  (120 mg total) by mouth daily., Disp: 90 capsule, Rfl: 2   leflunomide (ARAVA) 10 MG tablet, Take 1 tablet by mouth daily., Disp: , Rfl:    megestrol (MEGACE) 20 MG tablet, Take 20 mg by mouth daily., Disp: , Rfl:    nebivolol (BYSTOLIC) 5 MG tablet, Take 5 mg by mouth daily., Disp: , Rfl:    nitroGLYCERIN (NITROSTAT) 0.4 MG SL tablet, Place 1 tablet (0.4 mg total) under the tongue every 5 (five) minutes as needed for chest pain., Disp: 75 tablet, Rfl: 2   Omega-3 Fatty Acids (OMEGA-3 FISH OIL PO), Take 1 capsule by mouth daily., Disp: , Rfl:    pravastatin (PRAVACHOL) 20 MG tablet, Take 1 tablet (20 mg total) by mouth every evening., Disp: 90 tablet, Rfl: 3   telmisartan (MICARDIS)  80 MG tablet, Take 1 tablet (80 mg total) by mouth daily., Disp: 90 tablet, Rfl: 2   terbinafine (LAMISIL) 1 % cream, Apply 1 Application topically 2 (two) times daily., Disp: , Rfl:    thyroid (ARMOUR) 90 MG tablet, Take 90 mg by mouth every morning. , Disp: , Rfl:    triamcinolone ointment (KENALOG) 0.5 %, Apply 1 Application topically 2 (two) times daily., Disp: 30 g, Rfl: 0   TURMERIC PO, Take 720 mg by mouth., Disp: , Rfl:    Vibegron (GEMTESA) 75 MG TABS, Take 1 tablet (75 mg total) by mouth daily., Disp: , Rfl:    VTAMA 1 % CREA, APPLY 1 APPLICATION TOPICALLY DAILY, Disp: 60 g, Rfl: 5  Current Facility-Administered Medications:    sodium chloride flush (NS) 0.9 % injection 3 mL, 3 mL, Intravenous, Q12H, Lennette Bihari, MD  Facility-Administered Medications Ordered in Other Visits:    ondansetron (ZOFRAN) 4 mg in sodium chloride 0.9 % 50 mL IVPB, 4 mg, Intravenous, Q6H PRN, Coletta Memos, MD      Objective:   Vitals:   01/22/23 1447  BP: 136/64  Pulse: 82  Temp: 98.5 F (36.9 C)  TempSrc: Oral  SpO2: 97%  Weight: 179 lb 9.6 oz (81.5 kg)  Height: 5' 5.5" (1.664 m)    Estimated body mass index is 29.43 kg/m as calculated from the following:   Height as of this encounter: 5' 5.5" (1.664 m).    Weight as of this encounter: 179 lb 9.6 oz (81.5 kg).  @WEIGHTCHANGE @  [employer]   01/22/23 1447  Weight: 179 lb 9.6 oz (81.5 kg)     Physical Exam   General: No distress. Looks well O2 at rest: no Cane present: no Sitting in wheel chair: no Frail: no Obese: no Neuro: Alert and Oriented x 3. GCS 15. Speech normal Psych: Pleasant Resp:  Barrel Chest - no.  Wheeze - no, Crackles - yes base, No overt respiratory distress CVS: Normal heart sounds. Murmurs - no Ext: Stigmata of Connective Tissue Disease - no HEENT: Normal upper airway. PEERL +. No post nasal drip        Assessment:       ICD-10-CM   1. IPF (idiopathic pulmonary fibrosis) (HCC)  J84.112     2. H/O agent Orange exposure  Z77.098     3. Dysgeusia  R43.2     4. Unintentional weight loss  R63.4     5. Varicose veins of lower extremity with edema, right  I83.891          Plan:     Patient Instructions    Interstitial lung disease due to connective tissue disease (HCC) - IPF Hx of Agent Orange Exposure   - Pulmonary Fibrosis  overall progressive.  Hx of covid March 2023 - did not affect your fibrosis.  But recently stable.   Plan - -Continue monitor without pirfenidone for now  - due to side effects, current ivermectin  --Do spirometry and DLCO in 6  months and return for follow-up  - if progressive consider ofev or clinical trials  Dysgeusia Unintentional weight loss - despite stopping esbriet early April 2024 Fatigue  -Symptoms are all resolving in the last 3-4  months after stopping pirfenidone but respect your judgment that this could be because of C. difficile or positive treatment effect of ivermectin   Plan - Continue to hold pirfenidone but open to restarting it once ivermectin course is completed -No more pirfenidone   -  cma to mark this as allregy   Varicose veins Right lower  Plan  - talk to PCP Cameron Arbour, MD= =about this   Followup - 6 months return to see  Dr. Marchelle Gearing ; 30-minute visit but after spirometry and DLCO  -Symptom score and exercise hypoxemia test at    [facility] Return in about 6 months (around 07/24/2023) for 30 min visit, ILD, with Dr Marchelle Gearing, Face to Face Visit.    SIGNATURE    Dr. Kalman Shan, M.D., F.C.C.P,  Pulmonary and Critical Care Medicine Staff Physician, [facility] Center Director - Interstitial Lung Disease  Program  Pulmonary Fibrosis [facility] at [facility] [place], [place], [address]  Pager: [phone], If no answer or between  15:00h - 7:00h: call [phone] Telephone: [phone]  3:32 PM 01/22/2023

## 2023-01-22 NOTE — Addendum Note (Signed)
Addended byClyda Greener M on: 01/22/2023 03:38 PM   Modules accepted: Orders

## 2023-01-22 NOTE — Progress Notes (Signed)
Spoke to Cameron [name] and Cameron [name] over the phone. I told him that Cameron iron levels are improved from prior but still slightly low. Cameron labs others to evaluate for sources of anemia came back as normal. Cameron reticulocyte index (1.34) is slightly low but this could be explained by low iron levels. He has a follow up with Dr. Orlie Dakin with hematology next week and will discuss whether or not iron infusions are warranted at that time. I offered him two options on how to proceed, (1) plan for SBE due to recent slight drop in Hb or (2) recheck Cameron blood counts in 2 months to see if they have improved or worsened. He would prefer to recheck Cameron blood counts in 2 months.  [name], please have the patient come back for a lab check of Cameron CBC and ferritin/IBC in 2 months. Thanks.

## 2023-01-23 ENCOUNTER — Other Ambulatory Visit: Payer: Self-pay

## 2023-01-23 ENCOUNTER — Other Ambulatory Visit: Payer: Self-pay | Admitting: *Deleted

## 2023-01-23 ENCOUNTER — Inpatient Hospital Stay: Payer: Medicare Other | Attending: Oncology

## 2023-01-23 DIAGNOSIS — D509 Iron deficiency anemia, unspecified: Secondary | ICD-10-CM

## 2023-01-23 LAB — CBC WITH DIFFERENTIAL/PLATELET
Abs Immature Granulocytes: 0.03 10*3/uL (ref 0.00–0.07)
Basophils Absolute: 0.1 10*3/uL (ref 0.0–0.1)
Basophils Relative: 1 %
Eosinophils Absolute: 0.3 10*3/uL (ref 0.0–0.5)
Eosinophils Relative: 5 %
HCT: 31.6 % — ABNORMAL LOW (ref 39.0–52.0)
Hemoglobin: 10 g/dL — ABNORMAL LOW (ref 13.0–17.0)
Immature Granulocytes: 1 %
Lymphocytes Relative: 21 %
Lymphs Abs: 1.3 10*3/uL (ref 0.7–4.0)
MCH: 32.1 pg (ref 26.0–34.0)
MCHC: 31.6 g/dL (ref 30.0–36.0)
MCV: 101.3 fL — ABNORMAL HIGH (ref 80.0–100.0)
Monocytes Absolute: 0.7 10*3/uL (ref 0.1–1.0)
Monocytes Relative: 11 %
Neutro Abs: 3.8 10*3/uL (ref 1.7–7.7)
Neutrophils Relative %: 61 %
Platelets: 159 10*3/uL (ref 150–400)
RBC: 3.12 MIL/uL — ABNORMAL LOW (ref 4.22–5.81)
RDW: 15.6 % — ABNORMAL HIGH (ref 11.5–15.5)
WBC: 6.2 10*3/uL (ref 4.0–10.5)
nRBC: 0 % (ref 0.0–0.2)

## 2023-01-23 LAB — IRON AND TIBC
Iron: 59 ug/dL (ref 45–182)
Saturation Ratios: 17 % — ABNORMAL LOW (ref 17.9–39.5)
TIBC: 340 ug/dL (ref 250–450)
UIBC: 281 ug/dL

## 2023-01-23 LAB — FERRITIN: Ferritin: 221 ng/mL (ref 24–336)

## 2023-01-24 ENCOUNTER — Encounter: Payer: Self-pay | Admitting: Urology

## 2023-01-27 ENCOUNTER — Encounter: Payer: Self-pay | Admitting: Oncology

## 2023-01-27 ENCOUNTER — Inpatient Hospital Stay: Payer: Medicare Other

## 2023-01-27 ENCOUNTER — Inpatient Hospital Stay: Payer: Medicare Other | Attending: Oncology | Admitting: Oncology

## 2023-01-27 VITALS — BP 148/66 | HR 64

## 2023-01-27 VITALS — BP 144/68 | HR 65 | Temp 96.4°F | Resp 16 | Ht 65.5 in | Wt 180.0 lb

## 2023-01-27 DIAGNOSIS — Z87891 Personal history of nicotine dependence: Secondary | ICD-10-CM | POA: Insufficient documentation

## 2023-01-27 DIAGNOSIS — D509 Iron deficiency anemia, unspecified: Secondary | ICD-10-CM

## 2023-01-27 DIAGNOSIS — R531 Weakness: Secondary | ICD-10-CM | POA: Diagnosis not present

## 2023-01-27 DIAGNOSIS — R5383 Other fatigue: Secondary | ICD-10-CM | POA: Diagnosis not present

## 2023-01-27 MED ORDER — SODIUM CHLORIDE 0.9 % IV SOLN
Freq: Once | INTRAVENOUS | Status: AC
  Filled 2023-01-27: qty 250

## 2023-01-27 MED ORDER — SODIUM CHLORIDE 0.9 % IV SOLN
200.0000 mg | Freq: Once | INTRAVENOUS | Status: AC
  Administered 2023-01-27: 200 mg via INTRAVENOUS
  Filled 2023-01-27: qty 200
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
## 2023-01-27 NOTE — Patient Instructions (Signed)
 Iron Sucrose Injection What is this medication? IRON SUCROSE (EYE ern SOO krose) treats low levels of iron (iron deficiency anemia) in people with kidney disease. Iron is a mineral that plays an important role in making red blood cells, which carry oxygen from your lungs to the rest of your body. This medicine may be used for other purposes; ask your health care provider or pharmacist if you have questions. COMMON BRAND NAME(S): Venofer What should I tell my care team before I take this medication? They need to know if you have any of these conditions: Anemia not caused by low iron levels Heart disease High levels of iron in the blood Kidney disease Liver disease An unusual or allergic reaction to iron, other medications, foods, dyes, or preservatives Pregnant or trying to get pregnant Breastfeeding How should I use this medication? This medication is for infusion into a vein. It is given in a hospital or clinic setting. Talk to your care team about the use of this medication in children. While this medication may be prescribed for children as young as 2 years for selected conditions, precautions do apply. Overdosage: If you think you have taken too much of this medicine contact a poison control center or emergency room at once. NOTE: This medicine is only for you. Do not share this medicine with others. What if I miss a dose? Keep appointments for follow-up doses. It is important not to miss your dose. Call your care team if you are unable to keep an appointment. What may interact with this medication? Do not take this medication with any of the following: Deferoxamine Dimercaprol Other iron products This medication may also interact with the following: Chloramphenicol Deferasirox This list may not describe all possible interactions. Give your health care provider a list of all the medicines, herbs, non-prescription drugs, or dietary supplements you use. Also tell them if you smoke,  drink alcohol, or use illegal drugs. Some items may interact with your medicine. What should I watch for while using this medication? Visit your care team regularly. Tell your care team if your symptoms do not start to get better or if they get worse. You may need blood work done while you are taking this medication. You may need to follow a special diet. Talk to your care team. Foods that contain iron include: whole grains/cereals, dried fruits, beans, or peas, leafy green vegetables, and organ meats (liver, kidney). What side effects may I notice from receiving this medication? Side effects that you should report to your care team as soon as possible: Allergic reactions--skin rash, itching, hives, swelling of the face, lips, tongue, or throat Low blood pressure--dizziness, feeling faint or lightheaded, blurry vision Shortness of breath Side effects that usually do not require medical attention (report to your care team if they continue or are bothersome): Flushing Headache Joint pain Muscle pain Nausea Pain, redness, or irritation at injection site This list may not describe all possible side effects. Call your doctor for medical advice about side effects. You may report side effects to FDA at 1-800-FDA-1088. Where should I keep my medication? This medication is given in a hospital or clinic. It will not be stored at home. NOTE: This sheet is a summary. It may not cover all possible information. If you have questions about this medicine, talk to your doctor, pharmacist, or health care provider.  2024 Elsevier/Gold Standard (2022-10-17 00:00:00)

## 2023-01-30 ENCOUNTER — Encounter (HOSPITAL_COMMUNITY): Payer: Self-pay

## 2023-02-03 ENCOUNTER — Ambulatory Visit: Payer: Medicare Other | Attending: Physician Assistant | Admitting: Physician Assistant

## 2023-02-03 ENCOUNTER — Inpatient Hospital Stay: Payer: Medicare Other

## 2023-02-03 ENCOUNTER — Telehealth: Payer: Self-pay | Admitting: Cardiology

## 2023-02-03 VITALS — BP 152/69 | HR 60 | Temp 97.0°F | Resp 18

## 2023-02-03 VITALS — BP 110/62 | HR 64 | Ht 65.5 in | Wt 178.4 lb

## 2023-02-03 DIAGNOSIS — I4891 Unspecified atrial fibrillation: Secondary | ICD-10-CM | POA: Insufficient documentation

## 2023-02-03 DIAGNOSIS — I2510 Atherosclerotic heart disease of native coronary artery without angina pectoris: Secondary | ICD-10-CM | POA: Diagnosis not present

## 2023-02-03 DIAGNOSIS — E785 Hyperlipidemia, unspecified: Secondary | ICD-10-CM | POA: Insufficient documentation

## 2023-02-03 DIAGNOSIS — R0609 Other forms of dyspnea: Secondary | ICD-10-CM | POA: Diagnosis not present

## 2023-02-03 DIAGNOSIS — D509 Iron deficiency anemia, unspecified: Secondary | ICD-10-CM | POA: Diagnosis not present

## 2023-02-03 DIAGNOSIS — Z950 Presence of cardiac pacemaker: Secondary | ICD-10-CM | POA: Insufficient documentation

## 2023-02-03 DIAGNOSIS — I10 Essential (primary) hypertension: Secondary | ICD-10-CM | POA: Insufficient documentation

## 2023-02-03 DIAGNOSIS — E119 Type 2 diabetes mellitus without complications: Secondary | ICD-10-CM | POA: Insufficient documentation

## 2023-02-03 MED ORDER — SPIRONOLACTONE 25 MG PO TABS: 12.5000 mg | ORAL_TABLET | Freq: Every day | ORAL | 3 refills | Status: DC

## 2023-02-03 MED ORDER — SODIUM CHLORIDE 0.9% FLUSH
10.0000 mL | Freq: Once | INTRAVENOUS | Status: AC | PRN
  Administered 2023-02-03: 10 mL
  Filled 2023-02-03: qty 10

## 2023-02-03 MED ORDER — SODIUM CHLORIDE 0.9 % IV SOLN
200.0000 mg | Freq: Once | INTRAVENOUS | Status: AC
  Administered 2023-02-03: 200 mg via INTRAVENOUS
  Filled 2023-02-03: qty 200

## 2023-02-03 MED ORDER — SODIUM CHLORIDE 0.9 % IV SOLN
Freq: Once | INTRAVENOUS | Status: AC
  Filled 2023-02-03: qty 250

## 2023-02-03 NOTE — Patient Instructions (Signed)
Iron Sucrose Injection What is this medication? IRON SUCROSE (EYE ern SOO krose) treats low levels of iron (iron deficiency anemia) in people with kidney disease. Iron is a mineral that plays an important role in making red blood cells, which carry oxygen from your lungs to the rest of your body. This medicine may be used for other purposes; ask your health care provider or pharmacist if you have questions. COMMON BRAND NAME(S): Venofer What should I tell my care team before I take this medication? They need to know if you have any of these conditions: Anemia not caused by low iron levels Heart disease High levels of iron in the blood Kidney disease Liver disease An unusual or allergic reaction to iron, other medications, foods, dyes, or preservatives Pregnant or trying to get pregnant Breastfeeding How should I use this medication? This medication is for infusion into a vein. It is given in a hospital or clinic setting. Talk to your care team about the use of this medication in children. While this medication may be prescribed for children as young as 2 years for selected conditions, precautions do apply. Overdosage: If you think you have taken too much of this medicine contact a poison control center or emergency room at once. NOTE: This medicine is only for you. Do not share this medicine with others. What if I miss a dose? Keep appointments for follow-up doses. It is important not to miss your dose. Call your care team if you are unable to keep an appointment. What may interact with this medication? Do not take this medication with any of the following: Deferoxamine Dimercaprol Other iron products This medication may also interact with the following: Chloramphenicol Deferasirox This list may not describe all possible interactions. Give your health care provider a list of all the medicines, herbs, non-prescription drugs, or dietary supplements you use. Also tell them if you smoke,  drink alcohol, or use illegal drugs. Some items may interact with your medicine. What should I watch for while using this medication? Visit your care team regularly. Tell your care team if your symptoms do not start to get better or if they get worse. You may need blood work done while you are taking this medication. You may need to follow a special diet. Talk to your care team. Foods that contain iron include: whole grains/cereals, dried fruits, beans, or peas, leafy green vegetables, and organ meats (liver, kidney). What side effects may I notice from receiving this medication? Side effects that you should report to your care team as soon as possible: Allergic reactions--skin rash, itching, hives, swelling of the face, lips, tongue, or throat Low blood pressure--dizziness, feeling faint or lightheaded, blurry vision Shortness of breath Side effects that usually do not require medical attention (report to your care team if they continue or are bothersome): Flushing Headache Joint pain Muscle pain Nausea Pain, redness, or irritation at injection site This list may not describe all possible side effects. Call your doctor for medical advice about side effects. You may report side effects to FDA at 1-800-FDA-1088. Where should I keep my medication? This medication is given in a hospital or clinic. It will not be stored at home. NOTE: This sheet is a summary. It may not cover all possible information. If you have questions about this medicine, talk to your doctor, pharmacist, or health care provider.  2024 Elsevier/Gold Standard (2022-10-17 00:00:00)

## 2023-02-03 NOTE — Telephone Encounter (Signed)
Patient's wife is requesting orders for lab work.

## 2023-02-03 NOTE — Progress Notes (Unsigned)
Cardiology Office Note:  .   Date:  02/05/2023  ID:  Cameron [name], DOB December 24, 1940, MRN [id number] PCP: Marguarite Arbour, MD  [facility] Providers Cardiologist:  Nicki Guadalajara, MD Cardiology APP:  Marcelino Duster, PA  Electrophysiologist:  Lanier Prude, MD  Sleep Medicine:  Armanda Magic, MD     History of Present Illness: .   Cameron [name] is a 82 y.o. male with a hx of CAD, hypothyroidism, complete AV block s/p PPM, OSA on CPAP, atrial fibrillation, HTN, HLD, DM II and RA.  Patient was previously seen by Dr. Marchelle Gearing for shortness of breath.  Chest CT showed mild subpleural reticular densities in the posterior lateral aspect of both lower lobe concerning for mild fibrotic interstitial lung disease.  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  He also had severe disease in the proximal left circumflex, proximal OM1, proximal D3.  His dyspnea was felt to be a combination of lung disease and coronary artery disease.  He ended up having successful DES x2 placed the OM1 and left circumflex on 08/11/2017.  Last echocardiogram obtained on 09/17/2017 showed EF 55 to 60%, grade 1 DD, mild LVH, mild to moderate MR, moderate TR, PA peak pressure 41 mmHg.  Pulmonary function test obtained on 11/03/2017 showed diffusion defect consistent with pulmonary vascular status.  Carotid ultrasound obtained on 11/19/2017 showed minimal to moderate amount of bilateral atherosclerotic plaque. Head CT without contrast on the same day showed no evidence of acute intracranial abnormality, mild atrophy and chronic small vessel white matter ischemic changes. He was seen by [name] on 03/03/2018, Bystolic was reduced to 2.5 mg daily. Device interrogation on the day showed less than 1% AT/AF burden, maximum duration 16 seconds.  No high  ventricular rates noted.  Estimated longevity 4.2- 4.7 years.  Device programmed to optimize intrinsic conduction.  Cervical CT obtained in October 2019 showed cervical spinal stenosis.  Repeat echocardiogram in 2021 demonstrated EF 50 to 55%, grade 2 DD, trace AI, mild MR, moderately elevated PA systolic pressure.  Bystolic was later increased to 10 mg and lisinopril also increased. He had atrial lead malfunction and underwent a new RV lead placement in March 2022.  Due to progression of interstitial lung disease, he underwent repeat right heart cath on 05/29/2021 which showed mild pulmonary hypertension with PA pressure 55/18, wedge pressure 17 mmHg, mild to moderate MR.  He was seen by pulmonology service in July 2023, PFT shows some improvement.  He continues to be on Esbriet for interstitial lung disease.   Patient was admitted on 05/23/2022 with weakness and lightheadedness.  He also noticed dark stools.  Hemoglobin was 6.0 on admission and he required 2 units of packed red blood transfusion.  [facility] was consulted and performed upper endoscopy on 12/30 which showed benign-appearing esophageal stenosis, normal stomach, acquired duodenal stenosis.  Colonoscopy performed on the following day was significant for diverticulosis, nonbleeding internal hemorrhoid.  Capsule endoscopy was negative as well.  CT enterography was negative for acute finding.  He was transition from Xarelto to Eliquis due to lower GI bleed profile.  I last saw the patient in February 2024 and we restarted Eliquis 5 mg twice a day.  I discontinued his aspirin to lower the GI bleeding risk.  Echocardiogram obtained on 09/17/2022 showed EF 45 to 50%, grade  2 DD, severely elevated PASP measuring 61 mmHg, severe LAE, mild MR.  He was in by [name] on 09/24/2022 at which time he was doing well.  Spironolactone 12.5 mg daily was added.  He was last seen by Dr. Lalla Brothers on 12/25/2022 to discuss Watchman device.  Patient presents today for  follow-up.  In the past several weeks, he had a slightly worsening swelling and increased dyspnea on exertion.  PFT was stable.  When compared to his recent echocardiogram with previous echocardiogram, his EF has dropped.  I recommend a Lexiscan Myoview.  For some reason, the spironolactone that was added by [name] in May has been dropped off his medication list.  He says he never picked it up.  I will resume spironolactone at 12.5 mg daily.  He can follow-up with [name] in 4 to 5 months, earlier if Myoview come back positive.  I recommended leg elevation.    ROS:   Patient denies any chest pain but does have increasing dyspnea on exertion.  He has no orthopnea or PND.  Studies Reviewed: Marland Kitchen   EKG Interpretation Date/Time:  Tuesday February 03 2023 10:34:08 EDT Ventricular Rate:  64 PR Interval:  164 QRS Duration:  168 QT Interval:  456 QTC Calculation: 470 R Axis:   -84  Text Interpretation: AV dual-paced rhythm Confirmed by Azalee Course [phone]) on 02/05/2023 2:28:42 PM     Risk Assessment/Calculations:    CHA2DS2-VASc Score = 5   This indicates a 7.2% annual risk of stroke. The patient's score is based upon: CHF History: 0 HTN History: 0 Diabetes History: 1 Stroke History: 0 Vascular Disease History: 1 Age Score: 2 Gender Score: 0           Physical Exam:   VS:  BP 110/62   Pulse 64   Ht 5' 5.5" (1.664 m)   Wt 178 lb 6.4 oz (80.9 kg)   SpO2 96%   BMI 29.24 kg/m    Wt Readings from Last 3 Encounters:  02/03/23 178 lb 6.4 oz (80.9 kg)  01/27/23 180 lb (81.6 kg)  01/22/23 179 lb 9.6 oz (81.5 kg)    GEN: Well nourished, well developed in no acute distress NECK: No JVD; No carotid bruits CARDIAC: RRR, no murmurs, rubs, gallops RESPIRATORY:  Clear to auscultation without rales, wheezing or rhonchi  ABDOMEN: Soft, non-tender, non-distended EXTREMITIES:  No edema; No deformity   ASSESSMENT AND PLAN: .    Dyspnea on exertion: Recent echocardiogram obtained in  April 2024 showed EF 45 to 50%, given worsening dyspnea and a mild drop in the ejection fraction, we will proceed with myoview.  Add spironolactone 12.5 mg daily  Atrial fibrillation: On Eliquis, diltiazem and nebivolol  History of pacemaker: Followed by EP service  CAD: Patient previously underwent DES to OM1 and the left circumflex artery in March 2019.   Hypertension: Blood pressure stable  Hyperlipidemia: On pravastatin  DM 2: Managed by primary care provider    Informed Consent   Shared Decision Making/Informed Consent The risks [chest pain, shortness of breath, cardiac arrhythmias, dizziness, blood pressure fluctuations, myocardial infarction, stroke/transient ischemic attack, nausea, vomiting, allergic reaction, radiation exposure, metallic taste sensation and life-threatening complications (estimated to be 1 in 10,000)], benefits (risk stratification, diagnosing coronary artery disease, treatment guidance) and alternatives of a nuclear stress test were discussed in detail with Cameron [name] and he agrees to proceed.     Dispo: Follow-up with [name] in 4 to 5 months  Signed, Wynema Birch  [place], [place]

## 2023-02-03 NOTE — Telephone Encounter (Signed)
Patient's wife states that she had a note on her calendar that the patient needed lab work done today. It looks like CBC was requesting but this was recently drawn and is being monitored by hematology/oncology. Advised that additional lab work from Dr. Lalla Brothers was not needed at this time, however he does have an appointment with Azalee Course, PA today and if there is anything additional that he thinks is needed they can have it done at his office.

## 2023-02-03 NOTE — Progress Notes (Signed)
Patient tolerated Venofer infusion well. Explained recommendation of 30 min post monitoring. Patient refused to wait post monitoring. Educated on what signs to watch for & to call with any concerns. No questions, discharged. Stable  

## 2023-02-03 NOTE — Telephone Encounter (Signed)
error 

## 2023-02-03 NOTE — Patient Instructions (Addendum)
Medication Instructions:  START SPIRONOLACTONE 12.5 MG DAILY. (CUT 25 MG TABLET IN HALF) *If you need a refill on your cardiac medications before your next appointment, please call your pharmacy*   Lab Work: NO LABS If you have labs (blood work) drawn today and your tests are completely normal, you will receive your results only by: MyChart Message (if you have MyChart) OR A paper copy in the mail If you have any lab test that is abnormal or we need to change your treatment, we will call you to review the results.   Testing/Procedures:[address] Your physician has requested that you have a lexiscan myoview. For further information please visit [email or web address] Please follow instruction sheet, as given.   SEE INSTRUCTIONS BELOW.   Follow-Up: At [facility], you and your health needs are our priority.  As part of our continuing mission to provide you with exceptional heart care, we have created designated Provider Care Teams.  These Care Teams include your primary Cardiologist (physician) and Advanced Practice Providers (APPs -  Physician Assistants and Nurse Practitioners) who all work together to provide you with the care you need, when you need it.   Your next appointment:   4-5 month(s)  Provider:   Nicki Guadalajara, MD  Other Instructions Lexiscan Myocardial Perfusion Imaging Study.  Please arrive 15 minutes prior to your appointment time for registration and insurance purposes.   The test will take approximately 3 to 4 hours to complete; you may bring reading material.  If someone comes with you to your appointment, they will need to remain in the main lobby due to limited space in the testing area. **If you are pregnant or breastfeeding, please notify the nuclear lab prior to your appointment**   How to prepare for your Myocardial Perfusion Test: Do not eat or drink 3 hours prior to your test, except you may have water. Do not consume products  containing caffeine (regular or decaffeinated) 12 hours prior to your test. (ex: coffee, chocolate, sodas, tea). Do wear comfortable clothes (no dresses or overalls) and walking shoes, tennis shoes preferred (No heels or open toe shoes are allowed). Do NOT wear cologne, perfume, aftershave, or lotions (deodorant is allowed). If you use an inhaler, use it the AM of your test and bring it with you.  If you use a nebulizer, use it the AM of your test.  If these instructions are not followed, your test will have to be rescheduled.

## 2023-02-05 ENCOUNTER — Ambulatory Visit (INDEPENDENT_AMBULATORY_CARE_PROVIDER_SITE_OTHER): Payer: Medicare Other

## 2023-02-05 DIAGNOSIS — I495 Sick sinus syndrome: Secondary | ICD-10-CM | POA: Diagnosis not present

## 2023-02-05 LAB — CUP PACEART REMOTE DEVICE CHECK
Battery Remaining Longevity: 80 mo
Battery Remaining Percentage: 71 %
Battery Voltage: 3.01 V
Brady Statistic AP VP Percent: 91 %
Brady Statistic AP VS Percent: 1 %
Brady Statistic AS VP Percent: 8.6 %
Brady Statistic AS VS Percent: 1 %
Brady Statistic RA Percent Paced: 91 %
Brady Statistic RV Percent Paced: 99 %
Date Time Interrogation Session: 20240912020016
Implantable Lead Connection Status: 753985
Implantable Lead Connection Status: 753985
Implantable Lead Implant Date: 20130716
Implantable Lead Implant Date: 20220317
Implantable Lead Location: 753859
Implantable Lead Location: 753860
Implantable Lead Model: 1948
Implantable Lead Model: 5076
Implantable Pulse Generator Implant Date: 20220317
Lead Channel Impedance Value: 330 Ohm
Lead Channel Impedance Value: 440 Ohm
Lead Channel Pacing Threshold Amplitude: 0.625 V
Lead Channel Pacing Threshold Amplitude: 0.75 V
Lead Channel Pacing Threshold Pulse Width: 0.5 ms
Lead Channel Pacing Threshold Pulse Width: 0.5 ms
Lead Channel Sensing Intrinsic Amplitude: 1.5 mV
Lead Channel Sensing Intrinsic Amplitude: 12 mV
Lead Channel Setting Pacing Amplitude: 1 V
Lead Channel Setting Pacing Amplitude: 1.625
Lead Channel Setting Pacing Pulse Width: 0.5 ms
Lead Channel Setting Sensing Sensitivity: 7 mV
Pulse Gen Model: 2272
Pulse Gen Serial Number: 3908614

## 2023-02-06 ENCOUNTER — Telehealth: Payer: Self-pay

## 2023-02-06 ENCOUNTER — Inpatient Hospital Stay: Payer: Medicare Other

## 2023-02-06 ENCOUNTER — Encounter (HOSPITAL_COMMUNITY): Payer: Medicare Other

## 2023-02-06 NOTE — Telephone Encounter (Signed)
Spoke with the patient and his wife earlier this week. In Clinic, they requested not to have LAAO until January 2025. Since procedures are currently being scheduled in January, called to make a plan.   The patient has a stress test in October and does not want to do any other testing prior to that time.  He understands he will have to have a CT scan regardless prior to LAAO.  Will follow the patient and contact them after stress test is complete. Pending results and plan from [facility], will arrange CT and Watchman procedure. They understand the January 2025 schedule may be filled by the time they want to schedule.  They were both grateful for call and agreed with plan.

## 2023-02-10 ENCOUNTER — Inpatient Hospital Stay: Payer: Medicare Other

## 2023-02-13 ENCOUNTER — Inpatient Hospital Stay: Payer: Medicare Other

## 2023-02-16 ENCOUNTER — Telehealth (HOSPITAL_COMMUNITY): Payer: Self-pay

## 2023-02-16 NOTE — Telephone Encounter (Signed)
Spoke with the patient, detailed instructions given. She stated  that she would have him here for his test. Asked to call back with any questions. S.Bellarae Lizer CCT

## 2023-02-16 NOTE — Progress Notes (Signed)
Remote pacemaker transmission.   

## 2023-02-17 ENCOUNTER — Inpatient Hospital Stay: Payer: Medicare Other

## 2023-02-17 VITALS — BP 134/53 | HR 69 | Temp 97.6°F | Resp 18

## 2023-02-17 DIAGNOSIS — D509 Iron deficiency anemia, unspecified: Secondary | ICD-10-CM

## 2023-02-17 MED ORDER — SODIUM CHLORIDE 0.9 % IV SOLN
INTRAVENOUS | Status: DC
  Filled 2023-02-17: qty 250

## 2023-02-17 MED ORDER — SODIUM CHLORIDE 0.9 % IV SOLN
200.0000 mg | Freq: Once | INTRAVENOUS | Status: AC
  Administered 2023-02-17: 200 mg via INTRAVENOUS
  Filled 2023-02-17: qty 200

## 2023-02-17 NOTE — Progress Notes (Signed)
Tolerated infusion well. Declined to stay full 30 minutes post observation period. VSS

## 2023-02-20 ENCOUNTER — Inpatient Hospital Stay: Payer: Medicare Other

## 2023-02-20 VITALS — BP 134/62 | HR 60 | Temp 96.0°F | Resp 18

## 2023-02-20 DIAGNOSIS — D509 Iron deficiency anemia, unspecified: Secondary | ICD-10-CM | POA: Diagnosis not present

## 2023-02-20 MED ORDER — SODIUM CHLORIDE 0.9 % IV SOLN
INTRAVENOUS | Status: DC
  Filled 2023-02-20: qty 250

## 2023-02-20 MED ORDER — SODIUM CHLORIDE 0.9 % IV SOLN
200.0000 mg | Freq: Once | INTRAVENOUS | Status: AC
  Administered 2023-02-20: 200 mg via INTRAVENOUS
  Filled 2023-02-20: qty 200

## 2023-02-23 ENCOUNTER — Inpatient Hospital Stay: Payer: Medicare Other

## 2023-02-23 VITALS — BP 145/60 | HR 71 | Temp 98.0°F | Resp 18

## 2023-02-23 DIAGNOSIS — D509 Iron deficiency anemia, unspecified: Secondary | ICD-10-CM

## 2023-02-23 MED ORDER — SODIUM CHLORIDE 0.9 % IV SOLN
Freq: Once | INTRAVENOUS | Status: AC
  Filled 2023-02-23: qty 250

## 2023-02-23 MED ORDER — SODIUM CHLORIDE 0.9 % IV SOLN
200.0000 mg | Freq: Once | INTRAVENOUS | Status: AC
  Administered 2023-02-23: 200 mg via INTRAVENOUS
  Filled 2023-02-23: qty 200

## 2023-02-23 NOTE — Patient Instructions (Signed)
Iron Sucrose Injection What is this medication? IRON SUCROSE (EYE ern SOO krose) treats low levels of iron (iron deficiency anemia) in people with kidney disease. Iron is a mineral that plays an important role in making red blood cells, which carry oxygen from your lungs to the rest of your body. This medicine may be used for other purposes; ask your health care provider or pharmacist if you have questions. COMMON BRAND NAME(S): Venofer What should I tell my care team before I take this medication? They need to know if you have any of these conditions: Anemia not caused by low iron levels Heart disease High levels of iron in the blood Kidney disease Liver disease An unusual or allergic reaction to iron, other medications, foods, dyes, or preservatives Pregnant or trying to get pregnant Breastfeeding How should I use this medication? This medication is for infusion into a vein. It is given in a hospital or clinic setting. Talk to your care team about the use of this medication in children. While this medication may be prescribed for children as young as 2 years for selected conditions, precautions do apply. Overdosage: If you think you have taken too much of this medicine contact a poison control center or emergency room at once. NOTE: This medicine is only for you. Do not share this medicine with others. What if I miss a dose? Keep appointments for follow-up doses. It is important not to miss your dose. Call your care team if you are unable to keep an appointment. What may interact with this medication? Do not take this medication with any of the following: Deferoxamine Dimercaprol Other iron products This medication may also interact with the following: Chloramphenicol Deferasirox This list may not describe all possible interactions. Give your health care provider a list of all the medicines, herbs, non-prescription drugs, or dietary supplements you use. Also tell them if you smoke,  drink alcohol, or use illegal drugs. Some items may interact with your medicine. What should I watch for while using this medication? Visit your care team regularly. Tell your care team if your symptoms do not start to get better or if they get worse. You may need blood work done while you are taking this medication. You may need to follow a special diet. Talk to your care team. Foods that contain iron include: whole grains/cereals, dried fruits, beans, or peas, leafy green vegetables, and organ meats (liver, kidney). What side effects may I notice from receiving this medication? Side effects that you should report to your care team as soon as possible: Allergic reactions--skin rash, itching, hives, swelling of the face, lips, tongue, or throat Low blood pressure--dizziness, feeling faint or lightheaded, blurry vision Shortness of breath Side effects that usually do not require medical attention (report to your care team if they continue or are bothersome): Flushing Headache Joint pain Muscle pain Nausea Pain, redness, or irritation at injection site This list may not describe all possible side effects. Call your doctor for medical advice about side effects. You may report side effects to FDA at 1-800-FDA-1088. Where should I keep my medication? This medication is given in a hospital or clinic. It will not be stored at home. NOTE: This sheet is a summary. It may not cover all possible information. If you have questions about this medicine, talk to your doctor, pharmacist, or health care provider.  2024 Elsevier/Gold Standard (2022-10-17 00:00:00)

## 2023-02-24 ENCOUNTER — Other Ambulatory Visit: Payer: Self-pay | Admitting: *Deleted

## 2023-02-24 ENCOUNTER — Ambulatory Visit (HOSPITAL_COMMUNITY): Payer: Medicare Other | Attending: Physician Assistant

## 2023-02-24 ENCOUNTER — Other Ambulatory Visit: Payer: Self-pay

## 2023-02-24 DIAGNOSIS — R0609 Other forms of dyspnea: Secondary | ICD-10-CM | POA: Diagnosis present

## 2023-02-24 LAB — MYOCARDIAL PERFUSION IMAGING
LV dias vol: 107 mL (ref 62–150)
LV sys vol: 58 mL
Nuc Stress EF: 46 %
Peak HR: 63 {beats}/min
Rest HR: 60 {beats}/min
Rest Nuclear Isotope Dose: 10.7 mCi
SDS: 0
SRS: 4
SSS: 4
ST Depression (mm): 0 mm
Stress Nuclear Isotope Dose: 31.2 mCi
TID: 1.08

## 2023-02-24 MED ORDER — REGADENOSON 0.4 MG/5ML IV SOLN
0.4000 mg | Freq: Once | INTRAVENOUS | Status: AC
  Administered 2023-02-24: 0.4 mg via INTRAVENOUS

## 2023-02-24 MED ORDER — NITROGLYCERIN 0.4 MG SL SUBL: 0.4000 mg | SUBLINGUAL_TABLET | SUBLINGUAL | 3 refills | Status: DC | PRN

## 2023-02-24 MED ORDER — TECHNETIUM TC 99M TETROFOSMIN IV KIT
10.7000 | PACK | Freq: Once | INTRAVENOUS | Status: AC | PRN
  Administered 2023-02-24: 10.7 via INTRAVENOUS

## 2023-02-24 MED ORDER — TECHNETIUM TC 99M TETROFOSMIN IV KIT
31.2000 | PACK | Freq: Once | INTRAVENOUS | Status: AC | PRN
  Administered 2023-02-24: 31.2 via INTRAVENOUS

## 2023-02-25 ENCOUNTER — Telehealth: Payer: Self-pay | Admitting: Cardiovascular Disease

## 2023-02-25 NOTE — Telephone Encounter (Signed)
Discussed with wife the results in more detail and she states feeling reassured.   Patient also noted to have 2 appt.s made in December and then January.  Wife ask to keep the December appt and cancel January.  Completed as requested

## 2023-02-25 NOTE — Telephone Encounter (Signed)
Follow Up:    Patient wife said they saw the results on My Chart fror his Stress Test.She needs the nurse to please call and explain the results.

## 2023-03-02 ENCOUNTER — Other Ambulatory Visit: Payer: Self-pay | Admitting: *Deleted

## 2023-03-03 ENCOUNTER — Other Ambulatory Visit: Payer: Self-pay | Admitting: Emergency Medicine

## 2023-03-03 DIAGNOSIS — R609 Edema, unspecified: Secondary | ICD-10-CM

## 2023-03-04 ENCOUNTER — Ambulatory Visit
Admission: RE | Admit: 2023-03-04 | Discharge: 2023-03-04 | Disposition: A | Discharge status: Home / Self Care | Payer: Medicare Other | Source: Ambulatory Visit | Attending: Emergency Medicine | Admitting: Emergency Medicine

## 2023-03-04 ENCOUNTER — Encounter: Payer: Self-pay | Admitting: Oncology

## 2023-03-04 DIAGNOSIS — R609 Edema, unspecified: Secondary | ICD-10-CM

## 2023-03-11 ENCOUNTER — Other Ambulatory Visit: Payer: Self-pay | Admitting: *Deleted

## 2023-03-11 MED ORDER — GEMTESA 75 MG PO TABS: 75.0000 mg | ORAL_TABLET | Freq: Every day | ORAL | 3 refills | Status: DC

## 2023-03-13 ENCOUNTER — Encounter: Payer: Medicare Other | Admitting: Internal Medicine

## 2023-03-13 ENCOUNTER — Other Ambulatory Visit (INDEPENDENT_AMBULATORY_CARE_PROVIDER_SITE_OTHER): Payer: Medicare Other

## 2023-03-13 DIAGNOSIS — J849 Interstitial pulmonary disease, unspecified: Secondary | ICD-10-CM

## 2023-03-13 DIAGNOSIS — D509 Iron deficiency anemia, unspecified: Secondary | ICD-10-CM | POA: Diagnosis not present

## 2023-03-13 DIAGNOSIS — Z006 Encounter for examination for normal comparison and control in clinical research program: Secondary | ICD-10-CM

## 2023-03-13 LAB — CBC
HCT: 38.3 % — ABNORMAL LOW (ref 39.0–52.0)
Hemoglobin: 12.2 g/dL — ABNORMAL LOW (ref 13.0–17.0)
MCHC: 32 g/dL (ref 30.0–36.0)
MCV: 100.5 fL — ABNORMAL HIGH (ref 78.0–100.0)
Platelets: 168 10*3/uL (ref 150.0–400.0)
RBC: 3.81 Mil/uL — ABNORMAL LOW (ref 4.22–5.81)
RDW: 15.6 % — ABNORMAL HIGH (ref 11.5–15.5)
WBC: 6.1 10*3/uL (ref 4.0–10.5)

## 2023-03-13 LAB — IBC + FERRITIN
Ferritin: 426.2 ng/mL — ABNORMAL HIGH (ref 22.0–322.0)
Iron: 63 ug/dL (ref 42–165)
Saturation Ratios: 18.4 % — ABNORMAL LOW (ref 20.0–50.0)
TIBC: 343 ug/dL (ref 250.0–450.0)
Transferrin: 245 mg/dL (ref 212.0–360.0)

## 2023-03-13 NOTE — Research (Cosign Needed)
Title: Chronic Fibrosing Interstitial Lung Disease with Progressive Phenotype Prospective Outcomes (ILD-PRO) Registry    Protocol #: IPF-PRO-SUB, Clinical Trials # [id number], Sponsor: [employer]   Protocol Version Amendment 6 dated 30Apr2024  and confirmed current on 10Sep2024 Consent Version for today's visit date of  Is [employer] IRB Approved Version 22May2024 Revised 29Jul2024   Objectives:  Describe current approaches to diagnosis and treatment of chronic fibrosing ILDs with progressive phenotype  Describe the natural history of chronic fibrosing ILDs with progressive phenotype  Assess quality of life from self-administered participant reported questionnaires for each disease group  Describe participant interactions with the healthcare system, describe treatment practices across multiple institutions for each disease group  Collect biological samples linked to well characterized chronic fibrosing ILDs with progressive phenotype to identify disease biomarkers  Collect data and biological samples that will support future research studies.                                            Key Inclusion Criteria: Willing and able to provide informed consent  Age >= 30 years  Diagnosis of a non-IPF ILD of any duration, including, but not limited to Idiopathic Non-Specific Interstitial Pneumonia (INSIP), Unclassifiable Idiopathic Interstitial Pneumonias (IIPs), Interstitial Pneumonia with Autoimmune Features (IPAF), Autoimmune ILDs such as Rheumatoid Arthritis (RA-ILD) and Systemic Sclerosis (SSC-ILD), Chronic Hypersensitivity Pneumonitis (HP), Sarcoidosis or Exposure-related ILDs such as asbestosis.  Chronic fibrosing ILD defined by reticular abnormality with traction bronchiectasis with or without honeycombing confirmed by chest HRCT scan and/or lung biopsy.  Progressive phenotype as defined by fulfilling at least one of the criteria below of fibrotic changes (progression set  point) within the last 24 months regardless of treatment considered appropriate in individual ILDs:  decline in FVC % predicted (% pred) based on >10% relative decline  decline in FVC % pred based on >= 5 - <10% relative decline in FVC combined with worsening of respiratory symptoms as assessed by the site investigator  decline in FVC % pred based on >= 5 - <10% relative decline in FVC combined with increasing extent of fibrotic changes on chest imaging (HRCT scan) as assessed by the site investigator  decline in DLCO % pred based on >= 10% relative decline  worsening of respiratory symptoms as well as increasing extent of fibrotic changes on chest imaging (HRCT scan) as assessed by the site investigator independent of FVC change.     Key Exclusion Criteria: Malignancy, treated or untreated, other than skin or early stage prostate cancer, within the past 5 years  Currently listed for lung transplantation at the time of enrollment  Currently enrolled in a clinical trial at the time of enrollment in this registry    Clinical Research Coordinator / Research RN note : This visit for  Subject [id number] with DOB: 06-May-1941 on 18/Oct/2024 for the above protocol is Visit/Encounter 7 and is for purpose of research.    Subject expressed continued interest and consent in continuing as a study subject. Subject confirmed that there was no change in contact information (e.g. address, telephone, email). Subject thanked for participation in research and contribution to science.     During this visit on, the subject completed the blood work and questionnaires per the above referenced protocol. Please refer to the subject's paper source binder for further details.   Signed by Despina Hick  Clinical Research Coordinator [facility]  [place], [place]

## 2023-03-24 NOTE — Telephone Encounter (Signed)
Letter mailed to patient with appointment information 

## 2023-04-16 ENCOUNTER — Other Ambulatory Visit: Payer: Self-pay | Admitting: Dermatology

## 2023-04-30 ENCOUNTER — Ambulatory Visit: Payer: Medicare Other | Attending: Cardiovascular Disease | Admitting: Cardiovascular Disease

## 2023-04-30 ENCOUNTER — Encounter: Payer: Self-pay | Admitting: Cardiovascular Disease

## 2023-04-30 VITALS — BP 124/82 | HR 62 | Ht 67.0 in | Wt 183.2 lb

## 2023-04-30 DIAGNOSIS — I495 Sick sinus syndrome: Secondary | ICD-10-CM

## 2023-04-30 DIAGNOSIS — I442 Atrioventricular block, complete: Secondary | ICD-10-CM | POA: Diagnosis present

## 2023-04-30 DIAGNOSIS — G4733 Obstructive sleep apnea (adult) (pediatric): Secondary | ICD-10-CM

## 2023-04-30 DIAGNOSIS — Z7901 Long term (current) use of anticoagulants: Secondary | ICD-10-CM

## 2023-04-30 DIAGNOSIS — I4891 Unspecified atrial fibrillation: Secondary | ICD-10-CM | POA: Diagnosis present

## 2023-04-30 DIAGNOSIS — Z950 Presence of cardiac pacemaker: Secondary | ICD-10-CM | POA: Diagnosis present

## 2023-04-30 DIAGNOSIS — I2510 Atherosclerotic heart disease of native coronary artery without angina pectoris: Secondary | ICD-10-CM | POA: Diagnosis present

## 2023-04-30 DIAGNOSIS — D500 Iron deficiency anemia secondary to blood loss (chronic): Secondary | ICD-10-CM

## 2023-04-30 DIAGNOSIS — I10 Essential (primary) hypertension: Secondary | ICD-10-CM | POA: Diagnosis not present

## 2023-04-30 NOTE — Patient Instructions (Signed)
Medication Instructions:  No medication changes. *If you need a refill on your cardiac medications before your next appointment, please call your pharmacy*   Lab Work: If you have labs (blood work) drawn today and your tests are completely normal, you will receive your results only by: MyChart Message (if you have MyChart) OR A paper copy in the mail If you have any lab test that is abnormal or we need to change your treatment, we will call you to review the results.   Testing/Procedures: Your physician has requested that you have an echocardiogram in March 2025. Echocardiography is a painless test that uses sound waves to create images of your heart. It provides your doctor with information about the size and shape of your heart and how well your heart's chambers and valves are working. This procedure takes approximately one hour. There are no restrictions for this procedure. Please do NOT wear cologne, perfume, aftershave, or lotions (deodorant is allowed). Please arrive 15 minutes prior to your appointment time.  Please note: We ask at that you not bring children with you during ultrasound (echo/ vascular) testing. Due to room size and safety concerns, children are not allowed in the ultrasound rooms during exams. Our front office staff cannot provide observation of children in our lobby area while testing is being conducted. An adult accompanying a patient to their appointment will only be allowed in the ultrasound room at the discretion of the ultrasound technician under special circumstances. We apologize for any inconvenience.    Follow-Up: At [facility], you and your health needs are our priority.  As part of our continuing mission to provide you with exceptional heart care, we have created designated Provider Care Teams.  These Care Teams include your primary Cardiologist (physician) and Advanced Practice Providers (APPs -  Physician Assistants and Nurse Practitioners) who  all work together to provide you with the care you need, when you need it.  We recommend signing up for the patient portal called "MyChart".  Sign up information is provided on this After Visit Summary.  MyChart is used to connect with patients for Virtual Visits (Telemedicine).  Patients are able to view lab/test results, encounter notes, upcoming appointments, etc.  Non-urgent messages can be sent to your provider as well.   To learn more about what you can do with MyChart, go to [email or web address].    Your next appointment:   4 month(s)  Provider:   Azalee Course, PA-C

## 2023-04-30 NOTE — Progress Notes (Signed)
Cardiology Office Note    Date:  04/30/2023   ID:  Cameron [name], DOB Aug 26, 1940, MRN [id number]  PCP:  Marguarite Arbour, MD  Cardiologist:  Nicki Guadalajara, MD  EP: [name]  7 month follow-up evaluation  History of Present Illness:  Cameron [name] is a 82 y.o. 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  He had been evaluated at [facility] from the advanced heart failure team, Dr. Mayford Knife with reference to obstructive sleep apnea, and [name] for his EP and pacemaker.  He also has seen Dr. Dorisann Frames because of increasing shortness of breath.  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  He was also found to have aortic atherosclerosis with coronary calcification and enlarged pulmonary arteries with a peak PA pressure at 55 mm.  He was referred to Dr. Shirlee Latch who performed a diagnostic cardiac catheterization on August 11, 2017.  He was found to have normal left and right heart filling pressures.  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  He was found to have severe disease in his proximal circumflex, proximal OM1 proximal diagonal 3 and was felt that his dyspnea was the result of combination of parenchymal lung disease and coronary artery disease.  I was asked to perform invention to this bifurcation stenosis of his circumflex and OM1 vessel.  His procedure was difficult but successful with an excellent result in a 2.5 x 15 mm Resolute stent was inserted into the OM 1 vessel at the ostium and a 3.0 x 18 mm Resolute DES stent was inserted from the proximal circumflex into the mid AV groove circumflex beyond the  OM1 vessel.  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  That was the only time that I had seen the patient.  Apparently, the patient was recently seen by [name] who recommended he see me for his general cardiology care.  He presents for evaluation.    Since his intervention, he denies any anginal type symptoms.  He had noticed a rare episode of dizziness.  He has obstructive sleep apnea and has been on CPAP therapy for 2 months and was followed by Dr. Mayford Knife.   He underwent carotid ultrasound imaging in June 2019 which showed minimal to moderate bilateral atherosclerotic plaque.  A head CT without contrast did not show any evidence of acute intracranial abnormality and showed mild atrophy and chronic small vessel white matter ischemic changes.  He has been evaluated by [name] who follows his pacemaker.   He was evaluated by Azalee Course, PA with several occasions and was last seen in January 2021.  That time, he he had mild shortness of breath symptoms only when he performs strenuous activity.  He did not have any anginal type chest pain.  He was having lower extremity mild edema and he was given a prescription for Lasix to take on an as-needed basis.  I evaluated him in a telemedicine visit on July 05, 2019.  He has continued to be followed by Dr. Aram Beecham for his primary care.  He is scheduled to undergo a breathing test with Dr. Marchelle Gearing.  He has noticed his blood pressure to be elevated  as well as his heart rate in the upper 90s.  At that time, lisinopril was titrated to 20 mg and Bystolic was increased. He denies any anginal symptoms.  He has continued to use CPAP.  A download was obtained from April 02, 2019 through June 30, 2019 and he is meeting compliance with 97% of usage days.  Average CPAP use is 7 hours and 6 minutes.  At his set pressure of 7 cm, AHI is excellent at 1.3.   He was seen by Azalee Course, PA on September 05, 2019  his blood pressure was improved.  He had  presented to the emergency room in March 2021 with elevated potassium.   I saw him in September 2021.  He was not having any anginal symptomatology.  Blood pressure continued to be elevated despite taking lisinopril 20 mg and Bystolic 10 mg and I recommended further titration of lisinopril to 30 mg.  He has continued to to be evaluated by Dr. Marchelle Gearing for his interstitial lung disease.  He  has noticed further blood pressure elevation despite taking Bystolic 10 mg, lisinopril 20 mg.  He has a prescription for furosemide but rarely takes this.  He continues to be on DAPT with aspirin/Plavix.  He is on Esbriet for his interstitial lung disease.  He is diabetic on glimepiride in addition to Metformin.  He was recently started on a prednisone taper.  He continues to use CPAP with excellent compliance.    I saw him on  August 08 2020 and since his prior evaluation he was evaluated by [name] and is in need for pacemaker replacement as well as removal and replacement of his atrial lead.  He has continued to walk at least 1 mile 3 days/week.  He has continued to use CPAP and a download was obtained from February 14 through August 07, 2020 which continues to show excellent compliance with average use of 6 hours and 7 minutes per night.  At 7 cm water pressure AHI is excellent at 1.3/h.  He denies any anginal symptoms.  His blood pressure improved with his medication adjustments from his last office visit.    He was seen by Edd Fabian, NP in August 2022 and he was started on diltiazem 120 mg daily for increased palpitations.  He was recently evaluated by [name] on April 12, 2021 and it was felt at that time that the patient was in atrial flutter for approximately 2 weeks duration.  During that visit he was able to be paced to terminate his atrial flutter with an NIPS and with atrial pacing at 180 ms his atrial flutter terminated.  He continues to see Dr. Marchelle Gearing for his interstitial lung disease.  His  pulmonary function studies have indicated progression compared to 2 years previously.  He was recently felt by Dr. Marchelle Gearing that he may benefit from repeat right heart catheterization to further evaluate his pulmonary hypertension.  He has continued to be on Esbriet.    I saw him on May 09, 2021 at which time he denied any anginal symptoms.  His heart rate has been stable.  He has been using CPAP therapy.  He underwent a follow-up echo Doppler study April 08, 2021 which showed EF at 60 to 65% and calculated at 61% by 3D volume.  There was grade 2 diastolic dysfunction and mild LVH.  There was moderate elevation of pulmonary artery systolic pressure 54.8 mmHg.  There was moderate biatrial enlargement, mild to moderate mitral regurgitation and  moderate mitral annular calcification.  There was aortic sclerosis without stenosis.  A small PFO cannot be completely excluded.  He presents for evaluation.  I performed his right heart catheterization on May 29, 2021 which revealed mild pulmonary hypertension in this patient with interstitial lung disease with elevated RV at 54/12, PA pressure 55/18, mean PA pressure 31 mmHg, and PVR 2.6 WU.  His PW mean pressure was 17 mm with a V wave of 24 consistent with his documented mild to moderate mitral regurgitation.  He has been evaluated by EP and saw Maxine Glenn, PA-C for [name].  He had normal pacemaker function.  He was maintaining sinus rhythm with atrial fibrillation burden less than 1% by device and he was maintained on Xarelto.  I saw him on April 01, 2022 at which time he felt well but admitted to having occasional leg swelling.  He denied chest pain or shortness of breath.  He has been exercising at least 3 days/week.  He continues to be on Esbriet for his interstitial lung disease.  He is on telmisartan 80 mg, nebivolol 10 mg daily, and diltiazem 120 mg daily for hypertension.  He is unaware of recurrent AF.  He continues to be on  glimepiride for diabetes mellitus.  He is on Armour Thyroid 90 mg for hypothyroidism.  He continues to take pravastatin 20 mg for hyperlipidemia.    He was evaluated by Azalee Course, PA on July 11, 2022 following a recent hospitalization where he was admitted on May 23, 2022 with weakness and lightheaded this.  He was found to have hemoglobin of 6.0 on admission and repeat required 2 units of packed red blood cell transfusion.  He was evaluated by GI and upper endoscopy showed benign-appearing esophageal stenosis, normal stomach, and duodenal stenosis.  Colonoscopy revealed diverticulosis, nonbleeding internal hemorrhoids.  Capsule endoscopy was negative.  CT enterography  was negative for acute findings.  He was transition from Xarelto to Eliquis due to lower GI bleed profile.  During his evaluation with Wynema Birch, it was decided to restart Eliquis 5 mg twice a day and discontinue aspirin. Pravastatin 20 mg.  His heart rate was stable at 60 bpm on pacemaker.  An echo Doppler study in September 17, 2022 showed EF 45 to 50% and grade 2 diastolic dysfunction.  RV systolic function was moderately reduced.  There was severely elevated PA pressure at 61 mmHg.  There was severe left atrial dilatation and mild right atrial dilatation.  When I last saw him on Sep 24, 2022 he was remaining stable and was trying to walk 1 mile 3 days/week and he also does some light resistance training.  He does admit to some occasional swelling of his right leg.  He apparently has been off Esbriet for 2 months.  He has lost 30 pounds over the past year.  He has issues with metallic taste in his mouth.  He is followed by Dr. Orlie Dakin of hematology and apparently saw him today.  He has been undergoing iron transfusion therapy.  Recent BNP on April 16 was 482.  T4 was 0.72.  He is on Eliquis 2.5 mg twice a day, Jardiance 10 mg, nebivolol 5 mg daily, telmisartan 80 mg, Armour Thyroid 90 mg, and takes pravastatin 20 mg daily.    Since I last  saw him, he has been evaluated by Dr. Steffanie Dunn for his sick sinus syndrome, PAF, pacemaker, as well as his anemia.  Apparently when seen he was only taking Eliquis 2.5  mg once a day and was advised to at least take this twice a day although based on his age, renal function and weight 5 mg twice a day is the recommended dose.  The plan was to follow his hemoglobin to make certain this does not significantly drop and possibly reconsider consideration for Watchman device at a future evaluation.  He was evaluated by Azalee Course, and February 03, 2023.  With his echo showing slight reduction of LV function to 45 and 50%, Lexiscan Myoview was recommended which was done on February 24, 2023 and was interpreted as low risk.  EF 46%.  Presently, he denies any chest pain or significant change in shortness of breath.  He has been undergoing iron infusions.  He tells me he lost 30 pounds since January but has gained some back.  He continues to have metabolic taste in his mouth.  He is taking Eliquis twice a day.  He also is on allopurinol and colchicine history of gout.  Cardiac wise he is on diltiazem CD1 20 mg, nebivolol 5 mg, spironolactone 12.5 mg and telmisartan 80 mg daily.  He is on Armour Thyroid.  He presents for evaluation.   Past Medical History:  Diagnosis Date   Anemia    Anxiety self recent   Appendicitis    Atrial fibrillation (HCC) 12/12/2013   Bone cancer [facility]) father  47   BPH (benign prostatic hypertrophy)    CAD (coronary artery disease) 12/2015   Cath by Dr Katrinka Blazing reveals distal and small vessel CAD.  Medical therapy advised.   Chest pain 12/03/2015   CHF (congestive heart failure) (HCC)    Complete heart block (HCC)    s/p PPM   Coronary artery disease    Coronary artery disease involving native coronary artery of native heart with unstable angina pectoris (HCC) 08/11/2017   D-dimer, elevated 04/03/2017   Depression self recent   Drug reaction 07/11/2021   GERD  (gastroesophageal reflux disease)    History of blood transfusion 1968   "probably; related to getting wounded in [place]"   History of kidney stones    History of SCC (squamous cell carcinoma) of skin 07/24/2020   right upper arm/excision   Hyperglycemia 11/05/2013   Hyperlipidemia 11/05/2013   Hypertension    Hypothyroidism    Hypothyroidism, unspecified 11/05/2013   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  AHI was 20/h while supine.  Oxygen saturations dropped to 87%.  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   Pancreatic cancer [facility]) mother at age 17   Presence of permanent cardiac pacemaker 12/09/2011   Rheumatoid arthritis (HCC)    "hands" (08/11/2017)   Type II diabetes mellitus (HCC)     Past Surgical History:  Procedure Laterality Date   BACK SURGERY     BALLOON DILATION N/A 05/24/2022   Procedure: BALLOON DILATION;  Surgeon: Imogene Burn, MD;  Location: [facility] ENDOSCOPY;  Service: Gastroenterology;  Laterality: N/A;   CARDIAC CATHETERIZATION N/A 12/28/2015   Procedure: Left Heart Cath and Coronary Angiography;  Surgeon: Lyn Records, MD;  Location: [facility] INVASIVE CV LAB;  Service: Cardiovascular;  Laterality: N/A;   CATARACT EXTRACTION W/ INTRAOCULAR LENS  IMPLANT, BILATERAL Bilateral    COLONOSCOPY WITH PROPOFOL N/A 05/25/2022   Procedure: COLONOSCOPY WITH PROPOFOL;  Surgeon: Imogene Burn, MD;  Location:  [facility] ENDOSCOPY;  Service: Gastroenterology;  Laterality: N/A;   CORONARY ANGIOPLASTY WITH STENT PLACEMENT  08/11/2017   "2 stents"   CORONARY STENT INTERVENTION N/A 08/11/2017   Procedure: CORONARY STENT INTERVENTION;  Surgeon: Lennette Bihari, MD;  Location: [facility] INVASIVE CV LAB;  Service: Cardiovascular;  Laterality: N/A;   CYSTOSCOPY W/ STONE MANIPULATION     ESOPHAGOGASTRODUODENOSCOPY (EGD) WITH PROPOFOL N/A 05/24/2022    Procedure: ESOPHAGOGASTRODUODENOSCOPY (EGD) WITH PROPOFOL;  Surgeon: Imogene Burn, MD;  Location: [facility] ENDOSCOPY;  Service: Gastroenterology;  Laterality: N/A;   GIVENS CAPSULE STUDY N/A 05/25/2022   Procedure: GIVENS CAPSULE STUDY;  Surgeon: Imogene Burn, MD;  Location: [facility] ENDOSCOPY;  Service: Gastroenterology;  Laterality: N/A;   INGUINAL HERNIA REPAIR Left    INSERT / REPLACE / REMOVE PACEMAKER  12/09/2011   SJM Accent DR RF implanted by DR Allred for complete heart block and syncope   JOINT REPLACEMENT     LAPAROSCOPIC CHOLECYSTECTOMY     LEAD REVISION/REPAIR N/A 08/09/2020   Procedure: LEAD REVISION/REPAIR;  Surgeon: Hillis Range, MD;  Location: [facility] INVASIVE CV LAB;  Service: Cardiovascular;  Laterality: N/A;   LITHOTRIPSY     LUMBAR DISC SURGERY     "removed arthritis and spurs"   PERMANENT PACEMAKER INSERTION N/A 12/09/2011   Procedure: PERMANENT PACEMAKER INSERTION;  Surgeon: Hillis Range, MD;  Location: [facility] CATH LAB;  Service: Cardiovascular;  Laterality: N/A;   PPM GENERATOR CHANGEOUT N/A 08/09/2020   Procedure: PPM GENERATOR CHANGEOUT;  Surgeon: Hillis Range, MD;  Location: [facility] INVASIVE CV LAB;  Service: Cardiovascular;  Laterality: N/A;   PROSTATE SURGERY     REPLACEMENT TOTAL KNEE Right    RIGHT HEART CATH N/A 05/29/2021   Procedure: RIGHT HEART CATH;  Surgeon: Lennette Bihari, MD;  Location: [facility] INVASIVE CV LAB;  Service: Cardiovascular;  Laterality: N/A;   RIGHT/LEFT HEART CATH AND CORONARY ANGIOGRAPHY N/A 08/11/2017   Procedure: RIGHT/LEFT HEART CATH AND CORONARY ANGIOGRAPHY;  Surgeon: Laurey Morale, MD;  Location: [facility] INVASIVE CV LAB;  Service: Cardiovascular;  Laterality: N/A;   TRANSURETHRAL RESECTION OF PROSTATE  2017/2018    Current Medications: Outpatient Medications Prior to Visit  Medication Sig Dispense Refill   acetaminophen (TYLENOL) 500 MG tablet Take 500 mg by mouth every 6 (six) hours as needed for moderate pain.     aspirin 81 MG EC tablet Take 81 mg by  mouth daily.     CVS TRIPLE MAGNESIUM COMPLEX PO Take by mouth.     diltiazem (CARDIZEM CD) 120 MG 24 hr capsule Take 1 capsule (120 mg total) by mouth daily. 90 capsule 3   diltiazem (TIAZAC) 120 MG 24 hr capsule Take 120 mg by mouth.     ESBRIET 267 MG TABS TAKE 3 TABLETS THREE TIMES A DAY WITH MEALS (MAINTENANCE DOSE, START AFTER COMPLETED STARTER DOSE) 270 tablet 11   gabapentin (NEURONTIN) 300 MG capsule Take 300 mg by mouth at bedtime.     glimepiride (AMARYL) 2 MG tablet Take 2 mg by mouth every morning.     leflunomide (ARAVA) 10 MG tablet Take 10 mg by mouth daily.     metFORMIN (GLUCOPHAGE) 500 MG tablet Take 500 mg by mouth daily. 2 tablets daily     nebivolol (BYSTOLIC) 5 MG tablet Take 5 mg by mouth daily.     nitroGLYCERIN (NITROSTAT) 0.3 MG SL tablet Place under the tongue.     Omega-3 Fatty Acids (OMEGA-3 FISH OIL PO) Take 1 capsule by mouth daily.  pravastatin (PRAVACHOL) 20 MG tablet Take 1 tablet (20 mg total) by mouth every evening. *NEEDS OFFICE VISIT FOR FURTHER REFILLS* 90 tablet 0   telmisartan (MICARDIS) 80 MG tablet Take 80 mg by mouth daily.     thyroid (ARMOUR) 90 MG tablet Take 90 mg by mouth every morning.      Trospium Chloride 60 MG CP24 TAKE 1 CAPSULE BY MOUTH DAILY 30 capsule 2   TURMERIC PO Take 1 capsule by mouth daily.     XARELTO 20 MG TABS tablet TAKE ONE TABLET BY MOUTH EVERY DAY WITH SUPPERDISCONTINUE CLOPIDROGEL 30 tablet 3   Facility-Administered Medications Prior to Visit  Medication Dose Route Frequency Provider Last Rate Last Admin   ondansetron (ZOFRAN) 4 mg in sodium chloride 0.9 % 50 mL IVPB  4 mg Intravenous Q6H PRN Coletta Memos, MD         Allergies:   Ace inhibitors, Lisinopril, Pirfenidone, and Celecoxib   Social History   Socioeconomic History   Marital status: Married    Spouse name: Not on file   Number of children: Not on file   Years of education: Not on file   Highest education level: Not on file  Occupational History    Not on file  Tobacco Use   Smoking status: Former    Current packs/day: 0.00    Average packs/day: 1 pack/day for 5.0 years (5.0 ttl pk-yrs)    Types: Cigarettes    Start date: 07/11/1967    Quit date: 07/10/1972    Years since quitting: 50.8   Smokeless tobacco: Former    Types: Chew    Quit date: 1975  Vaping Use   Vaping status: Never Used  Substance and Sexual Activity   Alcohol use: Not Currently    Alcohol/week: 1.0 standard drink of alcohol    Types: 1 Glasses of wine per week   Drug use: No   Sexual activity: Not Currently  Other Topics Concern   Not on file  Social History Narrative   Not on file   Social Determinants of Health   Financial Resource Strain: Low Risk  (01/22/2023)   Received from [facility]   Overall Financial Resource Strain (CARDIA)    Difficulty of Paying Living Expenses: Not hard at all  Food Insecurity: No Food Insecurity (01/22/2023)   Received from [facility]   Hunger Vital Sign    Worried About Running Out of Food in the Last Year: Never true    Ran Out of Food in the Last Year: Never true  Transportation Needs: No Transportation Needs (01/22/2023)   Received from [facility] - Transportation    In the past 12 months, has lack of transportation kept you from medical appointments or from getting medications?: No    Lack of Transportation (Non-Medical): No  Physical Activity: Not on file  Stress: Not on file  Social Connections: Not on file     Family History:  The patient's family history includes Alzheimer's disease in his paternal grandmother and sister; Arthritis in his maternal grandfather; Bone cancer in his father; Cancer in his father and mother; Heart attack in his paternal uncle; Lung cancer in his paternal grandfather; Pancreatic cancer in his mother.   ROS General: Negative; No fevers, chills, or night sweats;  HEENT: Negative; No changes in vision or hearing, sinus  congestion, difficulty swallowing Pulmonary: Interstitial lung disease, reported exposure to agent orange Cardiovascular: See HPI GI: Negative; No  nausea, vomiting, diarrhea, or abdominal pain GU: BPH followed by [facility] urology Musculoskeletal: Negative; no myalgias, joint pain, or weakness Hematologic/Oncology: Undergoing iron infusions with Dr. Orlie Dakin Endocrine: Negative; no heat/cold intolerance; no diabetes Neuro: Negative; no changes in balance, headaches Skin: Negative; No rashes or skin lesions Psychiatric: Negative; No behavioral problems, depression Sleep: OSA on CPAP, no breakthrough snoring.  No daytime sleepiness. Other comprehensive 14 point system review is negative.   PHYSICAL EXAM:   VS:  BP 124/82   Pulse 62   Ht 5\' 7"  (1.702 m)   Wt 183 lb 3.2 oz (83.1 kg)   SpO2 94%   BMI 28.69 kg/m    Repeat blood pressure by me was 128/70    Wt Readings from Last 3 Encounters:  04/30/23 183 lb 3.2 oz (83.1 kg)  02/24/23 178 lb (80.7 kg)  02/03/23 178 lb 6.4 oz (80.9 kg)    General: Alert, oriented, no distress.  Skin: normal turgor, no rashes, warm and dry HEENT: Normocephalic, atraumatic. Pupils equal round and reactive to light; sclera anicteric; extraocular muscles intact;  Nose without nasal septal hypertrophy Mouth/Parynx benign; Mallinpatti scale 3 Neck: No JVD, no carotid bruits; normal carotid upstroke Lungs: clear to ausculatation and percussion; no wheezing or rales Chest wall: without tenderness to palpitation Heart: PMI not displaced, RRR, s1 s2 normal, 1/6 systolic murmur, no diastolic murmur, no rubs, gallops, thrills, or heaves Abdomen: soft, nontender; no hepatosplenomehaly, BS+; abdominal aorta nontender and not dilated by palpation. Back: no CVA tenderness Pulses 2+ Musculoskeletal: full range of motion, normal strength, no joint deformities Extremities: no clubbing cyanosis or edema, Homan's sign negative  Neurologic: grossly nonfocal; Cranial  nerves grossly wnl Psychologic: Normal mood and affect   Studies/Labs Reviewed:   April 30, 2023 ECG (independently read by me): AV paced rhythm at 62 bpm  Sep 24, 2022 ECG (independently read by me): Underlying atrial flutter with V pacing at 70 bpm  April 01, 2022 ECG (independently read by me): AV paced at 67  May 09, 2021 ECG (independently read by me): AV paced at 67  August 08, 2020  ECG (independently read by me): AV paced at 55  September 2021 ECG (independently read by me): AV paced rhythm at 61 bpm  Recent Labs:    Latest Ref Rng & Units 01/12/2023   12:02 PM 12/25/2022   10:40 AM 08/28/2022    4:31 PM  BMP  Glucose 70 - 99 mg/dL 540  981  191   BUN 8 - 23 mg/dL 29  28  18    Creatinine 0.61 - 1.24 mg/dL 4.78  2.95  6.21   BUN/Creat Ratio 10 - 24  27    Sodium 135 - 145 mmol/L 134  138  138   Potassium 3.5 - 5.1 mmol/L 4.4  5.4  3.9   Chloride 98 - 111 mmol/L 105  103  106   CO2 22 - 32 mmol/L 22  26  26    Calcium 8.9 - 10.3 mg/dL 9.1  30.8  9.2         Latest Ref Rng & Units 08/28/2022    4:31 PM 05/30/2022    4:44 AM 05/24/2022    6:31 AM  Hepatic Function  Total Protein 6.0 - 8.3 g/dL 6.7  5.1  5.0   Albumin 3.5 - 5.2 g/dL 3.8  2.4  2.6   AST 0 - 37 U/L 25  17  15    ALT 0 - 53 U/L 29  17  11   Alk Phosphatase 39 - 117 U/L 193  62  45   Total Bilirubin 0.2 - 1.2 mg/dL 0.5  0.5  0.7   Bilirubin, Direct 0.0 - 0.3 mg/dL 0.2          Latest Ref Rng & Units 03/13/2023    2:00 PM 01/23/2023   10:44 AM 01/12/2023   12:02 PM  CBC  WBC 4.0 - 10.5 K/uL 6.1  6.2  6.8   Hemoglobin 13.0 - 17.0 g/dL 16.1  09.6  04.5   Hematocrit 39.0 - 52.0 % 38.3  31.6  31.3   Platelets 150.0 - 400.0 K/uL 168.0  159  165    Lab Results  Component Value Date   MCV 100.5 (H) 03/13/2023   MCV 101.3 (H) 01/23/2023   MCV 95.4 01/12/2023   Lab Results  Component Value Date   TSH 3.700 09/04/2022   No results found for: "HGBA1C"   BNP No results found for:  "BNP"  ProBNP    Component Value Date/Time   PROBNP 482.0 (H) 09/09/2022 1446     Lipid Panel  No results found for: "CHOL", "TRIG", "HDL", "CHOLHDL", "VLDL", "LDLCALC", "LDLDIRECT", "LABVLDL"   RADIOLOGY: No results found.    Additional studies/ records that were reviewed today include:   ECHO 1/12021  MPRESSIONS   1. Left ventricular ejection fraction, by visual estimation, is 50 to  55%. The left ventricle has normal function. There is no left ventricular  hypertrophy.   2. Elevated left atrial pressure.   3. Left ventricular diastolic parameters are consistent with Grade II  diastolic dysfunction (pseudonormalization).   4. The left ventricle has no regional wall motion abnormalities.   5. Global right ventricle has normal systolic function.The right  ventricular size is mildly enlarged.   6. Left atrial size was normal.   7. Right atrial size was normal.   8. Mild mitral annular calcification.   9. The mitral valve is normal in structure. Mild mitral valve  regurgitation. No evidence of mitral stenosis.  10. The tricuspid valve is normal in structure.  11. The aortic valve is tricuspid. Aortic valve regurgitation is trivial.  Mild aortic valve sclerosis without stenosis.  12. The pulmonic valve was normal in structure. Pulmonic valve  regurgitation is trivial.  13. Moderately elevated pulmonary artery systolic pressure.  14. A pacer wire is visualized.  15. The inferior vena cava is normal in size with greater than 50%  respiratory variability, suggesting right atrial pressure of 3 mmHg.  16. Normal LV systolic function; grade 2 diastolic dysfunction; trace AI;  mild MR; mild RVE; moderate pulmonary hypertension.   In comparison to the previous echocardiogram(s): 09/17/17 EF 55-60%. PA  pressure .     ECHO: 04/08/2021 IMPRESSIONS   1. Left ventricular ejection fraction, by estimation, is 60 to 65%. Left  ventricular ejection fraction by 3D volume is 61  %. The left ventricle has  normal function. The left ventricle has no regional wall motion  abnormalities. There is mild left  ventricular hypertrophy. Left ventricular diastolic parameters are  consistent with Grade II diastolic dysfunction (pseudonormalization).   2. Right ventricular systolic function is normal. The right ventricular  size is normal. There is moderately elevated pulmonary artery systolic  pressure. The estimated right ventricular systolic pressure is 54.8 mmHg.   3. Left atrial size was moderately dilated.   4. Right atrial size was moderately dilated.   5. The mitral valve is abnormal. Mild to moderate mitral  valve  regurgitation. Moderate mitral annular calcification.   6. The tricuspid valve is abnormal. Tricuspid valve regurgitation is  moderate.   7. The aortic valve is tricuspid. Aortic valve regurgitation is trivial.  Aortic valve sclerosis is present, with no evidence of aortic valve  stenosis. Aortic regurgitation PHT measures 594 msec.   8. Cannot exclude a small PFO.   Comparison(s): Changes from prior study are noted. 06/07/2019: LVEF 50-55%.    R HEART CATH: 05/29/2021 Mild pulmonary hypertension in this patient with interstitial lung disease with elevated RV 54/12; PA pressure 55/18; mean 31 mmHg.  PVR 2.6 WU.    PW mean pressure is 17 mmHg, with V wave 24 c/w his echo documented mild - moderate mitral regurgitation.    The patient will resume his home medications commencing tomorrow including resumption of Xarelto.   ECHO; 09/17/2022   1. Left ventricular ejection fraction, by estimation, is 45 to 50%. Left  ventricular ejection fraction by 2D MOD biplane is 43.7 %. The left  ventricle has mildly decreased function. The left ventricle demonstrates  global hypokinesis. Left ventricular  diastolic parameters are consistent with Grade II diastolic dysfunction  (pseudonormalization). Elevated left ventricular end-diastolic pressure.   2. Right  ventricular systolic function is moderately reduced. The right  ventricular size is normal. There is severely elevated pulmonary artery  systolic pressure.   3. Left atrial size was severely dilated.   4. Right atrial size was mildly dilated.   5. The mitral valve is normal in structure. Mild mitral valve  regurgitation. No evidence of mitral stenosis.   6. Tricuspid valve regurgitation is moderate.   7. The aortic valve is tricuspid. Aortic valve regurgitation is mild. No  aortic stenosis is present. Aortic regurgitation PHT measures 592 msec.   8. The inferior vena cava is normal in size with greater than 50%  respiratory variability, suggesting right atrial pressure of 3 mmHg.    LEXISCAN MYOVIEW: 02/24/2023    Findings are equivocal. The study is low risk.   No ST deviation was noted.   Left ventricular function is abnormal. Global function is mildly reduced. Nuclear stress EF: 46%. The left ventricular ejection fraction is mildly decreased (45-54%). End diastolic cavity size is normal.   Prior study available for comparison from 05/22/2017.   Small fixed apical defect can be scar vs. Apical thinning; inferior defect that is worse with rest, suspicious for diaphragmatic attenuation. Wall motion in the septum can reflect V pacing.     ASSESSMENT:    1. SSS (sick sinus syndrome) (HCC)   2. Complete heart block (HCC)   3. Primary hypertension   4. Coronary artery disease involving native coronary artery of native heart without angina pectoris      PLAN:  1. CAD: He is status post PCI with DES stenting to his left circumflex and OM1 vessel on August 12, 2019.  He is not having anginal symptoms.  Most recent Lexiscan Myoview study from February 24, 2023 was low risk with nuclear stress EF at 46%.  Small fixed apical defect, possibly apical thinning due to diaphragmatic attenuation.  2.  Essential hypertension: Blood pressure today is stable on diltiazem CD1 20 mg, nebivolol 5 mg daily,  spironolactone 12.5 mg, and telmisartan 80 mg.  3.  Dyspnea: At cardiac catheterization in 2019 he had normal left and right filling pressures with mild pulmonary hypertension.  On echo Doppler study in January 2021 he had  grade 2 diastolic dysfunction, trace AI, mild MR,  mild RVE and moderate pulmonary hypertension with estimated RV systolic pressure of 48.7 mm.  He has interstitial lung disease followed by Dr. Marchelle Gearing. Echo Doppler study from April 08, 2021 shows normal systolic function with EF at 60 to 65% and grade 2 diastolic dysfunction.  Estimated pulmonary systolic pressure is now further increased at 54.8 mmHg.  There is evidence for moderate biatrial enlargement, mild to moderate mitral regurgitation with mitral annular calcification and aortic valve sclerosis.  On right heart catheterization performed by me on May 29, 2021 pulmonary hypertension PA pressure 55/18, mean 31.  PVR 2.6 Woods units.  PW mean pressure was 17 with V wave up to 24 consistent with his mild to moderate mitral regurgitation.  Echo from September 17, 2022 shows EF 45 to 50%, grade 2 diastolic dysfunction, and estimated PA pressure at 61 mmHg.  He is no longer on Esbriet.  I have recommended that he undergo a follow-up echo Doppler study in March 2025 and see how [facility] for follow-up evaluation.  If EF continues to be reduced, would consider discontinuing telmisartan and switch to Entresto.  4.  Leg swelling right greater than left: Improved with initiation of spironolactone which is also helpful as an aldosterone blocker with his mildly reduced LV function  5.  Pacemaker/atrial fibrillation/atrial flutter:  ECG shows AV paced rhythm.  Remote history of complete heart block.  He underwent pacemaker change out as well as new atrial lead placement by [name].  Followed by EP.  He has recently seen Dr. Steffanie Dunn.  With PAF, current rate control strategy  With his need for anticoagulation and anemia issues discussion  concerning possible Watchman device has been initiated.  7.  Hyperlipidemia: He continues to be on pravastatin 20 mg daily.  Target LDL less than 70.  8.  BPH:  followed by [facility] urology  9.  Iron deficiency: He continues to undergo iron infusions by Dr. Orlie Dakin of hematology.  9.  Type 2 diabetes mellitus: He has lost over 30 pounds.  He is no longer on metformin nor Jardiance, followed by primary care provider  10.  Hypothyroidism: On Armour Thyroid 90 mg daily, followed by Dr. Judithann Sheen.    I have recommended he undergo a 2D echo Doppler study in April with office visit with Azalee Course, PA in April.  If EF remains reduced, consider transition from ARB therapy with telmisartan to possible Entresto.   Medication Adjustments/Labs and Tests Ordered: Current medicines are reviewed at length with the patient today.  Concerns regarding medicines are outlined above.  Medication changes, Labs and Tests ordered today are listed in the Patient Instructions below.   There are no Patient Instructions on file for this visit.   Signed, Nicki Guadalajara, MD  04/30/2023 9:59 AM    [facility] [address], [address], [place], [place]  [address] Phone: [phone]

## 2023-05-02 ENCOUNTER — Encounter: Payer: Self-pay | Admitting: Cardiovascular Disease

## 2023-05-07 ENCOUNTER — Ambulatory Visit: Payer: Medicare Other

## 2023-05-07 ENCOUNTER — Encounter: Payer: Self-pay | Admitting: Cardiology

## 2023-05-07 DIAGNOSIS — I495 Sick sinus syndrome: Secondary | ICD-10-CM

## 2023-05-07 LAB — CUP PACEART REMOTE DEVICE CHECK
Battery Remaining Longevity: 74 mo
Battery Remaining Percentage: 68 %
Battery Voltage: 3.01 V
Brady Statistic AP VP Percent: 90 %
Brady Statistic AP VS Percent: 1 %
Brady Statistic AS VP Percent: 9.8 %
Brady Statistic AS VS Percent: 1 %
Brady Statistic RA Percent Paced: 90 %
Brady Statistic RV Percent Paced: 99 %
Date Time Interrogation Session: 20241212020014
Implantable Lead Connection Status: 753985
Implantable Lead Connection Status: 753985
Implantable Lead Implant Date: 20130716
Implantable Lead Implant Date: 20220317
Implantable Lead Location: 753859
Implantable Lead Location: 753860
Implantable Lead Model: 1948
Implantable Lead Model: 5076
Implantable Pulse Generator Implant Date: 20220317
Lead Channel Impedance Value: 340 Ohm
Lead Channel Impedance Value: 440 Ohm
Lead Channel Pacing Threshold Amplitude: 0.5 V
Lead Channel Pacing Threshold Amplitude: 1 V
Lead Channel Pacing Threshold Pulse Width: 0.5 ms
Lead Channel Pacing Threshold Pulse Width: 0.5 ms
Lead Channel Sensing Intrinsic Amplitude: 1.9 mV
Lead Channel Sensing Intrinsic Amplitude: 12 mV
Lead Channel Setting Pacing Amplitude: 1.25 V
Lead Channel Setting Pacing Amplitude: 1.5 V
Lead Channel Setting Pacing Pulse Width: 0.5 ms
Lead Channel Setting Sensing Sensitivity: 7 mV
Pulse Gen Model: 2272
Pulse Gen Serial Number: 3908614

## 2023-05-11 ENCOUNTER — Telehealth: Payer: Self-pay

## 2023-05-11 NOTE — Telephone Encounter (Signed)
Called for a Watchman update after the patient had a visit with [name]. The patient's wife reports he has an echo scheduled in March 2025 and [name] and they agreed to wait until after that for any further Watchman testing/planning. Will contact them after that visit to discuss. She was grateful for call and agreed with plan.

## 2023-05-27 DIAGNOSIS — I739 Peripheral vascular disease, unspecified: Secondary | ICD-10-CM

## 2023-05-27 HISTORY — DX: Peripheral vascular disease, unspecified: I73.9

## 2023-05-28 ENCOUNTER — Encounter: Payer: Self-pay | Admitting: Oncology

## 2023-06-01 ENCOUNTER — Inpatient Hospital Stay: Payer: Medicare Other | Attending: Oncology

## 2023-06-01 ENCOUNTER — Encounter: Payer: Self-pay | Admitting: Oncology

## 2023-06-01 ENCOUNTER — Other Ambulatory Visit: Payer: Self-pay | Admitting: *Deleted

## 2023-06-01 DIAGNOSIS — E785 Hyperlipidemia, unspecified: Secondary | ICD-10-CM | POA: Insufficient documentation

## 2023-06-01 DIAGNOSIS — I2510 Atherosclerotic heart disease of native coronary artery without angina pectoris: Secondary | ICD-10-CM | POA: Insufficient documentation

## 2023-06-01 DIAGNOSIS — E039 Hypothyroidism, unspecified: Secondary | ICD-10-CM | POA: Diagnosis not present

## 2023-06-01 DIAGNOSIS — Z79899 Other long term (current) drug therapy: Secondary | ICD-10-CM | POA: Insufficient documentation

## 2023-06-01 DIAGNOSIS — Z950 Presence of cardiac pacemaker: Secondary | ICD-10-CM | POA: Diagnosis not present

## 2023-06-01 DIAGNOSIS — Z808 Family history of malignant neoplasm of other organs or systems: Secondary | ICD-10-CM | POA: Insufficient documentation

## 2023-06-01 DIAGNOSIS — Z800 Family history of malignant neoplasm of digestive organs: Secondary | ICD-10-CM | POA: Diagnosis not present

## 2023-06-01 DIAGNOSIS — G4733 Obstructive sleep apnea (adult) (pediatric): Secondary | ICD-10-CM | POA: Diagnosis not present

## 2023-06-01 DIAGNOSIS — D509 Iron deficiency anemia, unspecified: Secondary | ICD-10-CM | POA: Insufficient documentation

## 2023-06-01 DIAGNOSIS — Z85828 Personal history of other malignant neoplasm of skin: Secondary | ICD-10-CM | POA: Insufficient documentation

## 2023-06-01 DIAGNOSIS — I4891 Unspecified atrial fibrillation: Secondary | ICD-10-CM | POA: Diagnosis not present

## 2023-06-01 DIAGNOSIS — Z7901 Long term (current) use of anticoagulants: Secondary | ICD-10-CM | POA: Insufficient documentation

## 2023-06-01 DIAGNOSIS — Z87891 Personal history of nicotine dependence: Secondary | ICD-10-CM | POA: Insufficient documentation

## 2023-06-01 DIAGNOSIS — Z801 Family history of malignant neoplasm of trachea, bronchus and lung: Secondary | ICD-10-CM | POA: Insufficient documentation

## 2023-06-01 LAB — FERRITIN: Ferritin: 465 ng/mL — ABNORMAL HIGH (ref 24–336)

## 2023-06-01 LAB — CBC WITH DIFFERENTIAL/PLATELET
Abs Immature Granulocytes: 0.03 10*3/uL (ref 0.00–0.07)
Basophils Absolute: 0.1 10*3/uL (ref 0.0–0.1)
Basophils Relative: 1 %
Eosinophils Absolute: 0.3 10*3/uL (ref 0.0–0.5)
Eosinophils Relative: 4 %
HCT: 38.4 % — ABNORMAL LOW (ref 39.0–52.0)
Hemoglobin: 12.7 g/dL — ABNORMAL LOW (ref 13.0–17.0)
Immature Granulocytes: 0 %
Lymphocytes Relative: 24 %
Lymphs Abs: 1.9 10*3/uL (ref 0.7–4.0)
MCH: 31.8 pg (ref 26.0–34.0)
MCHC: 33.1 g/dL (ref 30.0–36.0)
MCV: 96.2 fL (ref 80.0–100.0)
Monocytes Absolute: 0.7 10*3/uL (ref 0.1–1.0)
Monocytes Relative: 8 %
Neutro Abs: 5 10*3/uL (ref 1.7–7.7)
Neutrophils Relative %: 63 %
Platelets: 172 10*3/uL (ref 150–400)
RBC: 3.99 MIL/uL — ABNORMAL LOW (ref 4.22–5.81)
RDW: 14.8 % (ref 11.5–15.5)
WBC: 8.1 10*3/uL (ref 4.0–10.5)
nRBC: 0 % (ref 0.0–0.2)

## 2023-06-01 LAB — IRON AND TIBC
Iron: 122 ug/dL (ref 45–182)
Saturation Ratios: 32 % (ref 17.9–39.5)
TIBC: 379 ug/dL (ref 250–450)
UIBC: 257 ug/dL

## 2023-06-02 ENCOUNTER — Inpatient Hospital Stay: Payer: Medicare Other

## 2023-06-02 ENCOUNTER — Encounter: Payer: Self-pay | Admitting: Oncology

## 2023-06-02 ENCOUNTER — Inpatient Hospital Stay (HOSPITAL_BASED_OUTPATIENT_CLINIC_OR_DEPARTMENT_OTHER): Payer: Medicare Other | Admitting: Oncology

## 2023-06-02 VITALS — BP 140/54 | HR 66 | Temp 97.3°F | Resp 16 | Ht 67.0 in | Wt 183.0 lb

## 2023-06-02 DIAGNOSIS — D509 Iron deficiency anemia, unspecified: Secondary | ICD-10-CM
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
## 2023-06-03 ENCOUNTER — Ambulatory Visit: Payer: Medicare Other | Admitting: Dermatology

## 2023-06-03 ENCOUNTER — Encounter: Payer: Self-pay | Admitting: Dermatology

## 2023-06-03 ENCOUNTER — Encounter: Payer: Self-pay | Admitting: Oncology

## 2023-06-03 DIAGNOSIS — D485 Neoplasm of uncertain behavior of skin: Secondary | ICD-10-CM

## 2023-06-03 DIAGNOSIS — L814 Other melanin hyperpigmentation: Secondary | ICD-10-CM

## 2023-06-03 DIAGNOSIS — D492 Neoplasm of unspecified behavior of bone, soft tissue, and skin: Secondary | ICD-10-CM | POA: Diagnosis not present

## 2023-06-03 DIAGNOSIS — L821 Other seborrheic keratosis: Secondary | ICD-10-CM

## 2023-06-03 NOTE — Patient Instructions (Signed)

## 2023-06-03 NOTE — Progress Notes (Signed)
   Follow-Up Visit   Subjective  Cameron [name] is a 83 y.o. male who presents for the following: spots at right arm, sometimes itchy. Spot at left scalp, not bothersome for patient. Patient also has a dark spot at groin he would like checked.   The patient has spots, moles and lesions to be evaluated, some may be new or changing and the patient may have concern these could be cancer.   The following portions of the chart were reviewed this encounter and updated as appropriate: medications, allergies, medical history  Review of Systems:  No other skin or systemic complaints except as noted in HPI or Assessment and Plan.  Objective  Well appearing patient in no apparent distress; mood and affect are within normal limits.   A focused examination was performed of the following areas: Scalp, arms, groin  Relevant exam findings are noted in the Assessment and Plan.  glands penis 5.0 mm x 4.0 mm melanotic macule   Assessment & Plan   SEBORRHEIC KERATOSIS - Stuck-on, waxy, tan-brown papules and/or plaques on right forearm and left occipital/vertex scalp - Benign-appearing - Discussed benign etiology and prognosis. - Observe - Call for any changes    NEOPLASM OF UNCERTAIN BEHAVIOR OF SKIN glands penis Benign-appearing.   Offered biopsy. Patient opts for Observation.  Call clinic for new or changing lesions.  Recommend daily use of broad spectrum spf 30+ sunscreen to sun-exposed areas.   Will recheck on follow up. SEBORRHEIC KERATOSES   MELANOTIC MACULE OF PENIS    Return in about 6 months (around 12/01/2023) for recheck nevus, with Dr. Claudene, SK follow up.  LILLETTE [name], RMA, am acting as scribe for Boneta Claudene, MD .   Documentation: I have reviewed the above documentation for accuracy and completeness, and I agree with the above.  Boneta Claudene, MD

## 2023-06-04 ENCOUNTER — Ambulatory Visit: Payer: Medicare Other | Admitting: Cardiovascular Disease

## 2023-06-11 NOTE — Progress Notes (Signed)
Remote pacemaker transmission.   

## 2023-06-24 ENCOUNTER — Encounter: Payer: Self-pay | Admitting: Oncology

## 2023-07-01 ENCOUNTER — Ambulatory Visit: Payer: Medicare Other | Admitting: Internal Medicine

## 2023-07-01 ENCOUNTER — Encounter: Payer: Self-pay | Admitting: Internal Medicine

## 2023-07-01 VITALS — BP 124/64 | HR 64 | Ht 67.0 in | Wt 184.0 lb

## 2023-07-01 DIAGNOSIS — K315 Obstruction of duodenum: Secondary | ICD-10-CM

## 2023-07-01 DIAGNOSIS — K5900 Constipation, unspecified: Secondary | ICD-10-CM | POA: Diagnosis not present

## 2023-07-01 DIAGNOSIS — D649 Anemia, unspecified: Secondary | ICD-10-CM

## 2023-07-01 DIAGNOSIS — Z8619 Personal history of other infectious and parasitic diseases: Secondary | ICD-10-CM

## 2023-07-01 NOTE — Progress Notes (Signed)
 Chief Complaint: IDA  HPI : 83 year old male with history of A-fib on Eliquis , heart block s/p PM, CAD s/p PCI, OSA on CPAP, COPD, hypothyroidism, psoriatic arthritis, DM presents for follow up of IDA and weight loss  Interval History: Patient's hemoglobin has overall gotten better. He had a bout with constipation earlier this week. He took prune juice, two Dulcolax, a dose of Miralax , and put a heating pad on his body. Afterwards he had a large bowel movements. He does not think that he drinks enough liquids every day. He is physically active. Denies N&V. Denies ab pain. Denies blood in the stools.  Wt Readings from Last 3 Encounters:  07/01/23 184 lb (83.5 kg)  06/02/23 183 lb (83 kg)  04/30/23 183 lb 3.2 oz (83.1 kg)   Current Outpatient Medications  Medication Sig Dispense Refill   acetaminophen  (TYLENOL ) 500 MG tablet Take 500 mg by mouth as needed for moderate pain.     allopurinol (ZYLOPRIM) 300 MG tablet Take 300 mg by mouth daily.     apixaban  (ELIQUIS ) 5 MG TABS tablet Take 1 tablet (5 mg total) by mouth 2 (two) times daily. 180 tablet 3   BERBERINE CHLORIDE PO Take 1,500 mg by mouth.     Cholecalciferol (VITAMIN D3) 250 MCG (10000 UT) capsule Take 10,000 Units by mouth daily.     co-enzyme Q-10 30 MG capsule Take 30 mg by mouth 3 (three) times daily.     colchicine 0.6 MG tablet Take 0.6 mg by mouth daily.     leflunomide  (ARAVA ) 10 MG tablet Take 1 tablet by mouth daily.     nebivolol  (BYSTOLIC ) 5 MG tablet Take 5 mg by mouth daily.     nitroGLYCERIN  (NITROSTAT ) 0.4 MG SL tablet Place 1 tablet (0.4 mg total) under the tongue every 5 (five) minutes as needed for chest pain. 75 tablet 3   Omega-3 Fatty Acids (OMEGA 3 500 PO) Take by mouth.     pravastatin  (PRAVACHOL ) 20 MG tablet Take 1 tablet (20 mg total) by mouth every evening. 90 tablet 3   telmisartan  (MICARDIS ) 80 MG tablet Take 1 tablet (80 mg total) by mouth daily. 90 tablet 2   terbinafine  (LAMISIL ) 1 % cream Apply 1  Application topically 2 (two) times daily.     thyroid  (ARMOUR) 90 MG tablet Take 120 mg by mouth every morning.     triamcinolone  ointment (KENALOG ) 0.5 % Apply 1 Application topically 2 (two) times daily. 30 g 0   trospium  (SANCTURA ) 20 MG tablet Take 20 mg by mouth 2 (two) times daily.     TURMERIC PO Take 720 mg by mouth.     UNABLE TO FIND Take 1,500 mg by mouth daily. Med Name: Glucogold     UNABLE TO FIND Take 1,200 mg by mouth in the morning and at bedtime. Med Name: Benberine     UNABLE TO FIND Take 300 mg by mouth in the morning and at bedtime. Med Name: Dron protein plus     Vibegron  (GEMTESA ) 75 MG TABS Take 1 tablet (75 mg total) by mouth daily. 90 tablet 3   VTAMA  1 % CREA APPLY 1 APPLICATION TOPICALLY DAILY 60 g 5   diltiazem  (CARDIZEM  CD) 120 MG 24 hr capsule Take 1 capsule (120 mg total) by mouth daily. (Patient not taking: Reported on 07/01/2023) 90 capsule 2   glimepiride  (AMARYL ) 4 MG tablet Take 4 mg by mouth daily. (Patient not taking: Reported on 07/01/2023)  spironolactone  (ALDACTONE ) 25 MG tablet Take 0.5 tablets (12.5 mg total) by mouth daily. 30 tablet 3   Current Facility-Administered Medications  Medication Dose Route Frequency Provider Last Rate Last Admin   sodium chloride  flush (NS) 0.9 % injection 3 mL  3 mL Intravenous Q12H Burnard Debby LABOR, MD       Facility-Administered Medications Ordered in Other Visits  Medication Dose Route Frequency Provider Last Rate Last Admin   ondansetron  (ZOFRAN ) 4 mg in sodium chloride  0.9 % 50 mL IVPB  4 mg Intravenous Q6H PRN Gillie Duncans, MD       Physical Exam: BP 124/64   Pulse 64   Ht 5' 7 (1.702 m)   Wt 184 lb (83.5 kg)   BMI 28.82 kg/m  Constitutional: Pleasant,well-developed, male in no acute distress. HEENT: Normocephalic and atraumatic. Conjunctivae are normal. No scleral icterus. Cardiovascular: Normal rate, regular rhythm.  Pulmonary/chest: Effort normal and breath sounds normal. No wheezing, rales or  rhonchi. Abdominal: Soft, nondistended, nontender. Bowel sounds active throughout. There are no masses palpable. No hepatomegaly. Extremities: No edema Neurological: Alert and oriented to person place and time. Skin: Skin is warm and dry. No rashes noted. Psychiatric: Normal mood and affect. Behavior is normal.  Labs 05/2022: CBC with low Hb of 10.6. CMP with elevated glucose of 196. TSH nml.   Labs 08/2022: CBC with low Hb of 11.9. TSH nml. Vit B12 nml. HbA1C 7.8%. Vit D nml.   Labs 09/2022: BMP with elevated BUN of 38 and elevated Cr of 1.31. CBC with nml Hb of 13.9. [facility] Gastrointestinal test with C dif 10/21/22 was positive for C dif toxin.   Labs 12/2022: CBC with low Hb of 10.4. BMP unremarkable.  Labs 05/2023: CBC with low hemoglobin of 12.1.  CMP with mildly elevated alk phos of 140 and elevated glucose of 196.  TSH elevated at 10.9.  CT Enterography 05/29/22: IMPRESSION: 1. No acute abdominopelvic findings. 2. Non formed stool in the LEFT colon. 3. Small bilateral pleural effusions.  EGD 05/24/22:   Colonoscopy 05/25/22:   VCE 05/25/22   ASSESSMENT AND PLAN: Fatigue - resolved Altered taste - resolved Weight loss - resolved Anemia - improved Constipation Duodenal stenosis History of C dif Patient is doing very well currently.  His hemoglobin has now significantly improved over time.  Thus I do not think there is any necessity to perform any further endoscopic evaluation at this time.  Patient does describe some intermittent constipation so I recommended some conservative strategies to help with his constipation. - With improvement in Hb, no need for further endoscopic procedures at this time - Drink 8 cups of water per day to help prevent constipation - Could consider fiber supplement - Plan to hold off on future colonoscopies for colon cancer screening - RTC in 1 year  Estefana Kidney, MD  I spent 33 minutes of time, including in depth chart review, independent  review of results as outlined above, communicating results with the patient directly, face-to-face time with the patient, coordinating care, ordering studies and medications as appropriate, and documentation.

## 2023-07-01 NOTE — Patient Instructions (Signed)
 Follow up in 1 year   Drink 8 cups of water daily   Start a daily fiber supplement  _______________________________________________________  If your blood pressure at your visit was 140/90 or greater, please contact your primary care physician to follow up on this.  _______________________________________________________  If you are age 84 or older, your body mass index should be between 23-30. Your Body mass index is 28.82 kg/m. If this is out of the aforementioned range listed, please consider follow up with your Primary Care Provider.  If you are age 65 or younger, your body mass index should be between 19-25. Your Body mass index is 28.82 kg/m. If this is out of the aformentioned range listed, please consider follow up with your Primary Care Provider.   ________________________________________________________  The [facility] providers would like to encourage you to use MYCHART to communicate with providers for non-urgent requests or questions.  Due to long hold times on the telephone, sending your provider a message by [facility] may be a faster and more efficient way to get a response.  Please allow 48 business hours for a response.  Please remember that this is for non-urgent requests.  _______________________________________________________   Thank you for entrusting me with your care and for choosing [facility], Dr. Estefana Kidney

## 2023-07-21 ENCOUNTER — Telehealth: Payer: Self-pay | Admitting: Oncology

## 2023-07-21 ENCOUNTER — Encounter: Payer: Self-pay | Admitting: Urology

## 2023-07-21 ENCOUNTER — Ambulatory Visit (INDEPENDENT_AMBULATORY_CARE_PROVIDER_SITE_OTHER): Payer: TRICARE For Life (TFL) | Admitting: Urology

## 2023-07-21 ENCOUNTER — Other Ambulatory Visit: Payer: Self-pay

## 2023-07-21 ENCOUNTER — Encounter: Payer: Self-pay | Admitting: Oncology

## 2023-07-21 ENCOUNTER — Observation Stay
Admission: EM | Admit: 2023-07-21 | Discharge: 2023-07-22 | Disposition: A | Discharge status: Home / Self Care | Payer: Medicare Other | Attending: Obstetrics and Gynecology | Admitting: Obstetrics and Gynecology

## 2023-07-21 ENCOUNTER — Other Ambulatory Visit
Admission: RE | Admit: 2023-07-21 | Discharge: 2023-07-21 | Disposition: A | Discharge status: Home / Self Care | Payer: TRICARE For Life (TFL) | Source: Ambulatory Visit | Attending: Urology | Admitting: Urology

## 2023-07-21 VITALS — BP 123/68 | HR 99 | Ht 67.0 in | Wt 175.0 lb

## 2023-07-21 DIAGNOSIS — Z7901 Long term (current) use of anticoagulants: Secondary | ICD-10-CM | POA: Diagnosis not present

## 2023-07-21 DIAGNOSIS — E039 Hypothyroidism, unspecified: Secondary | ICD-10-CM | POA: Diagnosis not present

## 2023-07-21 DIAGNOSIS — N179 Acute kidney failure, unspecified: Secondary | ICD-10-CM | POA: Diagnosis not present

## 2023-07-21 DIAGNOSIS — E118 Type 2 diabetes mellitus with unspecified complications: Secondary | ICD-10-CM | POA: Diagnosis present

## 2023-07-21 DIAGNOSIS — Z950 Presence of cardiac pacemaker: Secondary | ICD-10-CM | POA: Insufficient documentation

## 2023-07-21 DIAGNOSIS — Z79899 Other long term (current) drug therapy: Secondary | ICD-10-CM | POA: Insufficient documentation

## 2023-07-21 DIAGNOSIS — E1122 Type 2 diabetes mellitus with diabetic chronic kidney disease: Secondary | ICD-10-CM | POA: Insufficient documentation

## 2023-07-21 DIAGNOSIS — J00 Acute nasopharyngitis [common cold]: Secondary | ICD-10-CM | POA: Insufficient documentation

## 2023-07-21 DIAGNOSIS — Z955 Presence of coronary angioplasty implant and graft: Secondary | ICD-10-CM | POA: Insufficient documentation

## 2023-07-21 DIAGNOSIS — Z85830 Personal history of malignant neoplasm of bone: Secondary | ICD-10-CM | POA: Insufficient documentation

## 2023-07-21 DIAGNOSIS — I130 Hypertensive heart and chronic kidney disease with heart failure and stage 1 through stage 4 chronic kidney disease, or unspecified chronic kidney disease: Secondary | ICD-10-CM | POA: Insufficient documentation

## 2023-07-21 DIAGNOSIS — I509 Heart failure, unspecified: Secondary | ICD-10-CM | POA: Diagnosis not present

## 2023-07-21 DIAGNOSIS — R34 Anuria and oliguria: Secondary | ICD-10-CM

## 2023-07-21 DIAGNOSIS — M1990 Unspecified osteoarthritis, unspecified site: Secondary | ICD-10-CM | POA: Diagnosis present

## 2023-07-21 DIAGNOSIS — N1832 Chronic kidney disease, stage 3b: Secondary | ICD-10-CM | POA: Diagnosis not present

## 2023-07-21 DIAGNOSIS — Z7984 Long term (current) use of oral hypoglycemic drugs: Secondary | ICD-10-CM | POA: Insufficient documentation

## 2023-07-21 DIAGNOSIS — R5381 Other malaise: Secondary | ICD-10-CM | POA: Insufficient documentation

## 2023-07-21 DIAGNOSIS — Z85828 Personal history of other malignant neoplasm of skin: Secondary | ICD-10-CM | POA: Insufficient documentation

## 2023-07-21 DIAGNOSIS — Z96651 Presence of right artificial knee joint: Secondary | ICD-10-CM | POA: Insufficient documentation

## 2023-07-21 DIAGNOSIS — I2510 Atherosclerotic heart disease of native coronary artery without angina pectoris: Secondary | ICD-10-CM | POA: Diagnosis not present

## 2023-07-21 DIAGNOSIS — I10 Essential (primary) hypertension: Secondary | ICD-10-CM | POA: Diagnosis present

## 2023-07-21 DIAGNOSIS — R531 Weakness: Secondary | ICD-10-CM | POA: Diagnosis present

## 2023-07-21 DIAGNOSIS — I4891 Unspecified atrial fibrillation: Secondary | ICD-10-CM | POA: Diagnosis not present

## 2023-07-21 DIAGNOSIS — J3489 Other specified disorders of nose and nasal sinuses: Secondary | ICD-10-CM | POA: Diagnosis not present

## 2023-07-21 DIAGNOSIS — G4733 Obstructive sleep apnea (adult) (pediatric): Secondary | ICD-10-CM | POA: Insufficient documentation

## 2023-07-21 DIAGNOSIS — Z1152 Encounter for screening for COVID-19: Secondary | ICD-10-CM | POA: Insufficient documentation

## 2023-07-21 DIAGNOSIS — N200 Calculus of kidney: Secondary | ICD-10-CM

## 2023-07-21 DIAGNOSIS — Z87891 Personal history of nicotine dependence: Secondary | ICD-10-CM | POA: Insufficient documentation

## 2023-07-21 DIAGNOSIS — M069 Rheumatoid arthritis, unspecified: Secondary | ICD-10-CM | POA: Insufficient documentation

## 2023-07-21 DIAGNOSIS — J849 Interstitial pulmonary disease, unspecified: Secondary | ICD-10-CM | POA: Diagnosis not present

## 2023-07-21 DIAGNOSIS — M549 Dorsalgia, unspecified: Secondary | ICD-10-CM | POA: Diagnosis not present

## 2023-07-21 LAB — RESP PANEL BY RT-PCR (RSV, FLU A&B, COVID)  RVPGX2
Influenza A by PCR: NEGATIVE
Influenza B by PCR: NEGATIVE
Resp Syncytial Virus by PCR: NEGATIVE
SARS Coronavirus 2 by RT PCR: NEGATIVE

## 2023-07-21 LAB — BASIC METABOLIC PANEL
Anion gap: 7 (ref 5–15)
BUN: 57 mg/dL — ABNORMAL HIGH (ref 8–23)
CO2: 23 mmol/L (ref 22–32)
Calcium: 9.6 mg/dL (ref 8.9–10.3)
Chloride: 107 mmol/L (ref 98–111)
Creatinine, Ser: 1.83 mg/dL — ABNORMAL HIGH (ref 0.61–1.24)
GFR, Estimated: 36 mL/min — ABNORMAL LOW (ref >60)
Glucose, Bld: 90 mg/dL (ref 70–99)
Potassium: 5.2 mmol/L — ABNORMAL HIGH (ref 3.5–5.1)
Sodium: 137 mmol/L (ref 135–145)

## 2023-07-21 LAB — CBC
HCT: 40.6 % (ref 39.0–52.0)
Hemoglobin: 13.3 g/dL (ref 13.0–17.0)
MCH: 31.9 pg (ref 26.0–34.0)
MCHC: 32.8 g/dL (ref 30.0–36.0)
MCV: 97.4 fL (ref 80.0–100.0)
Platelets: 173 10*3/uL (ref 150–400)
RBC: 4.17 MIL/uL — ABNORMAL LOW (ref 4.22–5.81)
RDW: 14.1 % (ref 11.5–15.5)
WBC: 9.8 10*3/uL (ref 4.0–10.5)
nRBC: 0 % (ref 0.0–0.2)

## 2023-07-21 LAB — MICROSCOPIC EXAMINATION

## 2023-07-21 LAB — URINALYSIS, COMPLETE
Bilirubin, UA: NEGATIVE
Glucose, UA: NEGATIVE
Ketones, UA: NEGATIVE
Leukocytes,UA: NEGATIVE
Nitrite, UA: NEGATIVE
Protein,UA: NEGATIVE
RBC, UA: NEGATIVE
Specific Gravity, UA: 1.025 (ref 1.005–1.030)
Urobilinogen, Ur: 0.2 mg/dL (ref 0.2–1.0)
pH, UA: 5 (ref 5.0–7.5)

## 2023-07-21 LAB — GLUCOSE, CAPILLARY: Glucose-Capillary: 112 mg/dL — ABNORMAL HIGH (ref 70–99)

## 2023-07-21 LAB — BLADDER SCAN AMB NON-IMAGING: Scan Result: 0

## 2023-07-21 MED ORDER — APIXABAN 5 MG PO TABS: 5.0000 mg | ORAL_TABLET | Freq: Two times a day (BID) | ORAL | Status: DC

## 2023-07-21 MED ORDER — ENOXAPARIN SODIUM 40 MG/0.4ML IJ SOSY
40.0000 mg | PREFILLED_SYRINGE | INTRAMUSCULAR | Status: DC
  Administered 2023-07-21: 40 mg via SUBCUTANEOUS
  Filled 2023-07-21: qty 0.4

## 2023-07-21 MED ORDER — ACETAMINOPHEN 325 MG PO TABS: 650.0000 mg | ORAL_TABLET | Freq: Four times a day (QID) | ORAL | Status: DC | PRN

## 2023-07-21 MED ORDER — APIXABAN 5 MG PO TABS
5.0000 mg | ORAL_TABLET | Freq: Two times a day (BID) | ORAL | Status: DC
Start: 2023-07-22 — End: 2023-07-21

## 2023-07-21 MED ORDER — MIRABEGRON ER 25 MG PO TB24
25.0000 mg | ORAL_TABLET | Freq: Every day | ORAL | Status: DC
  Administered 2023-07-22: 25 mg via ORAL
  Filled 2023-07-21: qty 1

## 2023-07-21 MED ORDER — LACTATED RINGERS IV BOLUS
1000.0000 mL | Freq: Once | INTRAVENOUS | Status: AC
Start: 2023-07-21 — End: 2023-07-21
  Administered 2023-07-21: 1000 mL via INTRAVENOUS

## 2023-07-21 MED ORDER — POLYETHYLENE GLYCOL 3350 17 G PO PACK
17.0000 g | PACK | Freq: Every day | ORAL | Status: DC | PRN
  Administered 2023-07-21: 17 g via ORAL
  Filled 2023-07-21: qty 1

## 2023-07-21 MED ORDER — ACETAMINOPHEN 650 MG RE SUPP: 650.0000 mg | Freq: Four times a day (QID) | RECTAL | Status: DC | PRN

## 2023-07-21 MED ORDER — APIXABAN 2.5 MG PO TABS
2.5000 mg | ORAL_TABLET | Freq: Two times a day (BID) | ORAL | Status: DC
  Administered 2023-07-22: 2.5 mg via ORAL
  Filled 2023-07-21: qty 1

## 2023-07-21 MED ORDER — FESOTERODINE FUMARATE ER 4 MG PO TB24
4.0000 mg | ORAL_TABLET | Freq: Every day | ORAL | Status: DC
  Administered 2023-07-22: 4 mg via ORAL
  Filled 2023-07-21: qty 1

## 2023-07-21 MED ORDER — LACTATED RINGERS IV SOLN: INTRAVENOUS | Status: AC

## 2023-07-21 MED ORDER — PRAVASTATIN SODIUM 20 MG PO TABS: 20.0000 mg | ORAL_TABLET | Freq: Every evening | ORAL | Status: DC

## 2023-07-21 MED ORDER — INSULIN ASPART 100 UNIT/ML IJ SOLN
0.0000 [IU] | Freq: Three times a day (TID) | INTRAMUSCULAR | Status: DC
  Administered 2023-07-22: 2 [IU] via SUBCUTANEOUS
  Filled 2023-07-21: qty 1

## 2023-07-21 MED ORDER — NEBIVOLOL HCL 5 MG PO TABS
5.0000 mg | ORAL_TABLET | Freq: Every day | ORAL | Status: DC
  Administered 2023-07-22: 5 mg via ORAL
  Filled 2023-07-21: qty 1
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
## 2023-07-21 NOTE — ED Notes (Signed)
 First Nurse Note: Patient sent to ED via POV from [facility] for low amount of urine output and back pain. Hx of kidney stones.

## 2023-07-21 NOTE — Assessment & Plan Note (Signed)
 Continue home Synthroid

## 2023-07-21 NOTE — Assessment & Plan Note (Addendum)
 Patient presenting with an acute kidney injury of unknown etiology. Urinalysis is unrevealing.  He has a history of BPH, so will obtain renal ultrasound to rule out retention/hydronephrosis. Since starting IV fluids, he has had 1 episode of urination.   - S/p 1 L bolus - Continue gentle maintenance fluids overnight - Renal ultrasound ordered - Hold home nephrotoxic agents - Strict in and out - Repeat BMP in the a.m.
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
## 2023-07-21 NOTE — Telephone Encounter (Signed)
 Patients wife called to change appointments due to going out of town. Appointments changed as requested

## 2023-07-21 NOTE — Assessment & Plan Note (Addendum)
-   Hold home regimen while admitted with AKI

## 2023-07-21 NOTE — Assessment & Plan Note (Signed)
 Well-controlled type 2 diabetes with last A1c of 5.9%  - Hold home glimepiride - SSI, sensitive

## 2023-07-21 NOTE — Assessment & Plan Note (Signed)
-   Continue home beta-blocker and Eliquis

## 2023-07-21 NOTE — ED Triage Notes (Signed)
 Patient states he was sent over for elevated BUN and Creatinine; reports he has had some difficulty urinating today.

## 2023-07-21 NOTE — ED Provider Notes (Signed)
 [facility] Provider Note    Event Date/Time   First MD Initiated Contact with Patient 07/21/23 1907     (approximate)   History   Weakness   HPI  Cameron [name] is a 83 y.o. male who presents to the ED for evaluation of Weakness   Review of urology clinic visit from earlier today.  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  Seen for an acute visit due to poor urination, PVR 0 stat labs were sent with an AKI and he was sent to the ED for evaluation. Baseline creatinine around 1.0, up to 1.83 today with mild hyperkalemia at 5.2.  Patient presents due to lack of urinary output today.  Reports getting over a head cold recently with symptoms improving.   Physical Exam   Triage Vital Signs: ED Triage Vitals  Encounter Vitals Group     BP 07/21/23 1600 (!) 128/55     Systolic BP Percentile --      Diastolic BP Percentile --      Pulse Rate 07/21/23 1600 60     Resp 07/21/23 1600 20     Temp 07/21/23 1600 98.5 F (36.9 C)     Temp Source 07/21/23 1600 Oral     SpO2 07/21/23 1600 99 %     Weight 07/21/23 1559 175 lb (79.4 kg)     Height 07/21/23 1559 5\' 7"  (1.702 m)     Head Circumference --      Peak Flow --      Pain Score 07/21/23 1559 0     Pain Loc --      Pain Education --      Exclude from Growth Chart --     Most recent vital signs: Vitals:   07/21/23 1600  BP: (!) 128/55  Pulse: 60  Resp: 20  Temp: 98.5 F (36.9 C)  SpO2: 99%    General: Awake, no distress.  CV:  Good peripheral perfusion.  Resp:  Normal effort.  Abd:  No distention.  MSK:  No deformity noted.  Neuro:  No focal deficits appreciated. Other:     ED Results / Procedures / Treatments   Labs (all labs ordered are listed, but only abnormal results are displayed) Labs Reviewed  RESP PANEL BY RT-PCR (RSV, FLU A&B, COVID)  [id number]    EKG   RADIOLOGY   Official radiology report(s): No results  found.  PROCEDURES and INTERVENTIONS:  Procedures  Medications  lactated ringers bolus 1,000 mL (1,000 mLs Intravenous New Bag/Given 07/21/23 1950)     IMPRESSION / MDM / ASSESSMENT AND PLAN / ED COURSE  I reviewed the triage vital signs and the nursing notes.  Differential diagnosis includes, but is not limited to, obstructing ureteral stone, prerenal azotemia or dehydration, sepsis  {Patient presents with symptoms of an acute illness or injury that is potentially life-threatening.  Patient presents with signs of an AKI requiring medical admission.  We will swab for COVID and flu considering preceding URI as possible contributor to poor intake.  Consult with medicine for admission      FINAL CLINICAL IMPRESSION(S) / ED DIAGNOSES   Final diagnoses:  AKI (acute kidney injury) (HCC)     Rx / DC Orders   ED Discharge Orders     None        Note:  This document was prepared using Dragon voice recognition software and may include unintentional dictation errors.  Delton Prairie, MD 07/21/23 [phone]

## 2023-07-21 NOTE — Assessment & Plan Note (Signed)
-   CPAP at bedtime

## 2023-07-21 NOTE — Assessment & Plan Note (Signed)
-   Hold home spironolactone and telmisartan - Continue home nebivolol

## 2023-07-22 ENCOUNTER — Observation Stay: Payer: Medicare Other

## 2023-07-22 DIAGNOSIS — N179 Acute kidney failure, unspecified: Secondary | ICD-10-CM | POA: Diagnosis not present

## 2023-07-22 LAB — BASIC METABOLIC PANEL
Anion gap: 4 — ABNORMAL LOW (ref 5–15)
BUN: 42 mg/dL — ABNORMAL HIGH (ref 8–23)
CO2: 24 mmol/L (ref 22–32)
Calcium: 9.3 mg/dL (ref 8.9–10.3)
Chloride: 110 mmol/L (ref 98–111)
Creatinine, Ser: 1.19 mg/dL (ref 0.61–1.24)
GFR, Estimated: >60 mL/min (ref >60)
Glucose, Bld: 110 mg/dL — ABNORMAL HIGH (ref 70–99)
Potassium: 4.6 mmol/L (ref 3.5–5.1)
Sodium: 138 mmol/L (ref 135–145)

## 2023-07-22 LAB — TSH: TSH: 0.832 u[IU]/mL (ref 0.350–4.500)

## 2023-07-22 LAB — GLUCOSE, CAPILLARY: Glucose-Capillary: 169 mg/dL — ABNORMAL HIGH (ref 70–99)

## 2023-07-22 NOTE — Progress Notes (Signed)
 Patient received from procedure via bed in stable condition.

## 2023-07-22 NOTE — Progress Notes (Signed)
 Pt refuses wheelchair to medical mall entrance.

## 2023-07-22 NOTE — Discharge Summary (Signed)
 Cameron [name] HQI:[id number] DOB: 10/02/1940 DOA: 07/21/2023  PCP: Marguarite Arbour, MD  Admit date: 07/21/2023 Discharge date: 07/22/2023  Time spent: 35 minutes  Recommendations for Outpatient Follow-up:  Pcp f/u 1 week, check fluid status, bp and kidney function/electrolytes, consider resumption of spironolactone     Discharge Diagnoses:  Principal Problem:   AKI (acute kidney injury) (HCC) Active Problems:   Hypertension   Pacemaker-St.Jude   Atrial fibrillation (HCC)   Hypothyroidism   Hypothyroidism, unspecified   Inflammatory arthritis   Rheumatoid arthritis (HCC)   Coronary artery disease   OSA (obstructive sleep apnea)   ILD (interstitial lung disease) (HCC)   Stage 3b chronic kidney disease (HCC)   Type II diabetes mellitus with complication (HCC)   Type 2 diabetes mellitus with diabetic chronic kidney disease (HCC)   Discharge Condition: stable  Diet recommendation: heart healthy  Filed Weights   07/21/23 1559 07/22/23 0405  Weight: 79.4 kg 82.8 kg    History of present illness:  From admission h and p Cameron [name] is a 83 y.o. 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   Mr. Hern states that over the last week, he has had a head cold, endorsing rhinorrhea and generalized malaise.  He denies any nausea, vomiting, diarrhea.  He states that his oral intake has been normal.  Then over the last 3 days, he has noticed that his urine output has decreased, with no urine output over the last 24 hours.  He followed up with his urologist, who recommended he come to the ED immediately.  He denies any shortness of breath, lower extremity swelling, dizziness.  Hospital Course:  Patient presents referred from  urologist for [facility], had presented there complaining of one day scant urine output. Bladder scan at urologist showed empty bladder. Here. Found to have mild aki. No vomiting/diarrhea, otherwise feeling well. Here he was hydrated and on hospital day 1 kidney function and potassium (mildly elevated on presentation) have returned to baseline. Renal u/s no signs obstruction or other acute abnormality. Urinalysis benign. No new meds. Advise adequate hydration and will hold home spironolactone for now, advise close pcp f/u for check of kidney function and electrolytes.   Procedures: none   Consultations: none  Discharge Exam: Vitals:   07/22/23 0405 07/22/23 0938  BP: (!) 120/45 (!) 120/47  Pulse: 63 (!) 59  Resp: 18 18  Temp: 97.7 F (36.5 C) 98.7 F (37.1 C)  SpO2: 97% 100%    General: NAD Cardiovascular: RRR Respiratory: CTAB  Discharge Instructions   Discharge Instructions     Diet - low sodium heart healthy   Complete by: As directed    Increase activity slowly   Complete by: As directed       Allergies as of 07/22/2023       Reactions   Ace Inhibitors Swelling   Tongue swelling, angioedema   Lisinopril    Other reaction(s): Lip swelling, O/E - lip swelling   Pirfenidone Other (See Comments)   Unintentional weight loss and fatigue   Celecoxib Rash   Skin rash        Medication List     STOP taking these medications    empagliflozin 10 MG Tabs tablet Commonly known as: JARDIANCE   spironolactone 25 MG tablet Commonly  known as: ALDACTONE       TAKE these medications    acetaminophen 500 MG tablet Commonly known as: TYLENOL Take 500 mg by mouth as needed for moderate pain.   allopurinol 300 MG tablet Commonly known as: ZYLOPRIM Take 300 mg by mouth daily.   apixaban 5 MG Tabs tablet Commonly known as: ELIQUIS Take 1 tablet (5 mg total) by mouth 2 (two) times daily.   BERBERINE CHLORIDE PO Take 1,500 mg by mouth.   co-enzyme Q-10 30 MG  capsule Take 30 mg by mouth 3 (three) times daily.   colchicine 0.6 MG tablet Take 0.6 mg by mouth daily.   Gemtesa 75 MG Tabs Generic drug: Vibegron Take 1 tablet (75 mg total) by mouth daily.   glimepiride 4 MG tablet Commonly known as: AMARYL Take 4 mg by mouth daily.   leflunomide 10 MG tablet Commonly known as: ARAVA Take 1 tablet by mouth daily.   mirtazapine 15 MG tablet Commonly known as: REMERON Take 15 mg by mouth at bedtime.   nebivolol 5 MG tablet Commonly known as: BYSTOLIC Take 5 mg by mouth daily.   nitroGLYCERIN 0.4 MG SL tablet Commonly known as: NITROSTAT Place 1 tablet (0.4 mg total) under the tongue every 5 (five) minutes as needed for chest pain.   pravastatin 20 MG tablet Commonly known as: PRAVACHOL Take 1 tablet (20 mg total) by mouth every evening.   telmisartan 80 MG tablet Commonly known as: MICARDIS Take 1 tablet (80 mg total) by mouth daily.   terbinafine 1 % cream Commonly known as: LAMISIL Apply 1 Application topically 2 (two) times daily.   thyroid 90 MG tablet Commonly known as: ARMOUR Take 120 mg by mouth every morning.   triamcinolone ointment 0.5 % Commonly known as: KENALOG Apply 1 Application topically 2 (two) times daily.   trospium 20 MG tablet Commonly known as: SANCTURA Take 20 mg by mouth 2 (two) times daily.   TURMERIC PO Take 720 mg by mouth.   UNABLE TO FIND Take 1,500 mg by mouth daily. Med Name: Glucogold   UNABLE TO FIND Take 1,200 mg by mouth in the morning and at bedtime. Med Name: Benberine   UNABLE TO FIND Take 300 mg by mouth in the morning and at bedtime. Med Name: Dron protein plus   Vitamin D3 250 MCG (10000 UT) capsule Take 10,000 Units by mouth daily.   Vtama 1 % Crea Generic drug: Tapinarof APPLY 1 APPLICATION TOPICALLY DAILY       Allergies  Allergen Reactions   Ace Inhibitors Swelling    Tongue swelling, angioedema   Lisinopril     Other reaction(s): Lip swelling, O/E - lip  swelling   Pirfenidone Other (See Comments)    Unintentional weight loss and fatigue   Celecoxib Rash    Skin rash     Follow-up Information     Marguarite Arbour, MD Follow up in 1 week(s).   Specialty: Internal Medicine Contact information: [facility] [address] [place] [address] [phone]                  The results of significant diagnostics from this hospitalization (including imaging, microbiology, ancillary and laboratory) are listed below for reference.    Significant Diagnostic Studies: US RENAL Result Date: 07/22/2023 CLINICAL DATA:  [id number] AKI (acute kidney injury) (HCC) [id number] EXAM: RENAL / URINARY TRACT ULTRASOUND COMPLETE COMPARISON:  CT abdomen pelvis 01/24/2022 FINDINGS: Right Kidney: Renal measurements: 11.5 x 5.3  x 4.2 cm = volume: 132 mL. Echogenicity increased. No mass or hydronephrosis visualized. Left Kidney: Renal measurements: 11.1 x 6 x 4.4 cm = volume: 154 mL. Echogenicity increased. No mass or hydronephrosis visualized. Urinary bladder: Appears normal for degree of bladder distention. Other: None. 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 Electronically Signed   By: Tish Frederickson M.D.   On: 07/22/2023 09:40    Microbiology: Recent Results (from the past 240 hours)  Microscopic Examination     Status: None   Collection Time: 07/21/23  2:35 PM   Urine  Result Value Ref Range Status   WBC, UA 0-5 0 - 5 /hpf Final   RBC, Urine 0-2 0 - 2 /hpf Final   Epithelial Cells (non renal) 0-10 0 - 10 /hpf Final   Bacteria, UA Few None seen/Few Final  Resp panel by RT-PCR (RSV, Flu A&B, Covid) Anterior Nasal Swab     Status: None   Collection Time: 07/21/23  7:53 PM   Specimen: Anterior Nasal Swab  Result Value Ref Range Status   SARS Coronavirus 2 by RT PCR NEGATIVE NEGATIVE Final    Comment: (NOTE) SARS-CoV-2 target nucleic acids are NOT DETECTED.  The SARS-CoV-2 RNA is generally  detectable in upper respiratory specimens during the acute phase of infection. The lowest concentration of SARS-CoV-2 viral copies this assay can detect is 138 copies/mL. A negative result does not preclude SARS-Cov-2 infection and should not be used as the sole basis for treatment or other patient management decisions. A negative result may occur with  improper specimen collection/handling, submission of specimen other than nasopharyngeal swab, presence of viral mutation(s) within the areas targeted by this assay, and inadequate number of viral copies(<138 copies/mL). A negative result must be combined with clinical observations, patient history, and epidemiological information. The expected result is Negative.  Fact Sheet for Patients:  BloggerCourse.com  Fact Sheet for Healthcare Providers:  SeriousBroker.it  This test is no t yet approved or cleared by the [place] FDA and  has been authorized for detection and/or diagnosis of SARS-CoV-2 by FDA under an Emergency Use Authorization (EUA). This EUA will remain  in effect (meaning this test can be used) for the duration of the COVID-19 declaration under Section 564(b)(1) of the Act, 21 U.S.C.section 360bbb-3(b)(1), unless the authorization is terminated  or revoked sooner.       Influenza A by PCR NEGATIVE NEGATIVE Final   Influenza B by PCR NEGATIVE NEGATIVE Final    Comment: (NOTE) The Xpert Xpress SARS-CoV-2/FLU/RSV plus assay is intended as an aid in the diagnosis of influenza from Nasopharyngeal swab specimens and should not be used as a sole basis for treatment. Nasal washings and aspirates are unacceptable for Xpert Xpress SARS-CoV-2/FLU/RSV testing.  Fact Sheet for Patients: BloggerCourse.com  Fact Sheet for Healthcare Providers: SeriousBroker.it  This test is not yet approved or cleared by the [place] FDA  and has been authorized for detection and/or diagnosis of SARS-CoV-2 by FDA under an Emergency Use Authorization (EUA). This EUA will remain in effect (meaning this test can be used) for the duration of the COVID-19 declaration under Section 564(b)(1) of the Act, 21 U.S.C. section 360bbb-3(b)(1), unless the authorization is terminated or revoked.     Resp Syncytial Virus by PCR NEGATIVE NEGATIVE Final    Comment: (NOTE) Fact Sheet for Patients: BloggerCourse.com  Fact Sheet for Healthcare Providers: SeriousBroker.it  This test is not yet approved or cleared by the [place] FDA and has  been authorized for detection and/or diagnosis of SARS-CoV-2 by FDA under an Emergency Use Authorization (EUA). This EUA will remain in effect (meaning this test can be used) for the duration of the COVID-19 declaration under Section 564(b)(1) of the Act, 21 U.S.C. section 360bbb-3(b)(1), unless the authorization is terminated or revoked.  Performed at [facility], [address] [place], [place] [address]      Labs: Basic Metabolic Panel: Recent Labs  Lab 07/21/23 1506 07/22/23 0450  NA 137 138  K 5.2* 4.6  CL 107 110  CO2 23 24  GLUCOSE 90 110*  BUN 57* 42*  CREATININE 1.83* 1.19  CALCIUM 9.6 9.3   Liver Function Tests: No results for input(s): "AST", "ALT", "ALKPHOS", "BILITOT", "PROT", "ALBUMIN" in the last 168 hours. No results for input(s): "LIPASE", "AMYLASE" in the last 168 hours. No results for input(s): "AMMONIA" in the last 168 hours. CBC: Recent Labs  Lab 07/21/23 2208  WBC 9.8  HGB 13.3  HCT 40.6  MCV 97.4  PLT 173   Cardiac Enzymes: No results for input(s): "CKTOTAL", "CKMB", "CKMBINDEX", "TROPONINI" in the last 168 hours. BNP: BNP (last 3 results) No results for input(s): "BNP" in the last 8760 hours.  ProBNP (last 3 results) Recent Labs    09/09/22 1446  PROBNP 482.0*    CBG: Recent  Labs  Lab 07/21/23 2247  GLUCAP 112*       Signed:  Silvano Bilis MD.  [facility] 07/22/2023, 9:56 AM

## 2023-07-22 NOTE — Plan of Care (Signed)

## 2023-07-22 NOTE — Progress Notes (Signed)
Patient sent for procedure via bed in stable condition.

## 2023-07-23 ENCOUNTER — Telehealth: Payer: Self-pay

## 2023-07-23 NOTE — Telephone Encounter (Signed)
 Cameron [name] left a message on triage voicemail stating patient is not able to urinate since 9:30 am today.  I called and spoke with patient and his wife. Patient has urinated since leaving that message a bit earlier, he urinated a big amount and urine was a bit darker yellow color. He has drank about 3 glasses of water today. He was sent to ER on 2/25 from his OV with Carollee Herter and was released on 07/22/23. He has a f/u from ER visit with his PCP Dr Judithann Sheen on 07/29/23. He is not having any lower abdominal pain or any other symptoms at this time. Encouraged to increase water intake. Patient does not have a follow up with out office. Any recommendations from [place] for the patient at this time?  Patient was advised to call back if he has any issues with urination for a long time and develops lower abdominal pain or bloating feeling.

## 2023-07-24 LAB — CULTURE, URINE COMPREHENSIVE

## 2023-07-24 NOTE — Telephone Encounter (Signed)
 Cameron [name] advised and appointment scheduled.

## 2023-07-27 ENCOUNTER — Encounter: Payer: Self-pay | Admitting: Oncology

## 2023-07-28 ENCOUNTER — Ambulatory Visit (HOSPITAL_COMMUNITY)
Admission: RE | Admit: 2023-07-28 | Discharge: 2023-07-28 | Disposition: A | Discharge status: Home / Self Care | Payer: TRICARE For Life (TFL) | Source: Ambulatory Visit | Attending: Cardiovascular Disease | Admitting: Cardiovascular Disease

## 2023-07-28 ENCOUNTER — Encounter: Payer: Self-pay | Admitting: Cardiovascular Disease

## 2023-07-28 DIAGNOSIS — I495 Sick sinus syndrome: Secondary | ICD-10-CM

## 2023-07-28 DIAGNOSIS — I10 Essential (primary) hypertension: Secondary | ICD-10-CM

## 2023-07-28 DIAGNOSIS — I2510 Atherosclerotic heart disease of native coronary artery without angina pectoris: Secondary | ICD-10-CM

## 2023-07-28 DIAGNOSIS — I442 Atrioventricular block, complete: Secondary | ICD-10-CM

## 2023-07-28 LAB — ECHOCARDIOGRAM COMPLETE
AR max vel: 1.74 cm2
AV Area VTI: 1.91 cm2
AV Area mean vel: 1.76 cm2
AV Mean grad: 5 mmHg
AV Peak grad: 10.5 mmHg
AV Vena cont: 0.44 cm
Ao pk vel: 1.62 m/s
Area-P 1/2: 4.89 cm2
MV M vel: 5.07 m/s
MV Peak grad: 102.8 mmHg
Radius: 0.76 cm
S' Lateral: 2.86 cm

## 2023-08-04 ENCOUNTER — Other Ambulatory Visit (HOSPITAL_COMMUNITY): Payer: Medicare Other

## 2023-08-06 ENCOUNTER — Ambulatory Visit: Payer: Medicare Other

## 2023-08-06 DIAGNOSIS — I495 Sick sinus syndrome: Secondary | ICD-10-CM

## 2023-08-06 LAB — CUP PACEART REMOTE DEVICE CHECK
Battery Remaining Longevity: 77 mo
Battery Remaining Percentage: 65 %
Battery Voltage: 3.01 V
Brady Statistic AP VP Percent: 92 %
Brady Statistic AP VS Percent: 1 %
Brady Statistic AS VP Percent: 7.8 %
Brady Statistic AS VS Percent: 1 %
Brady Statistic RA Percent Paced: 92 %
Brady Statistic RV Percent Paced: 99 %
Date Time Interrogation Session: 20250313020015
Implantable Lead Connection Status: 753985
Implantable Lead Connection Status: 753985
Implantable Lead Implant Date: 20130716
Implantable Lead Implant Date: 20220317
Implantable Lead Location: 753859
Implantable Lead Location: 753860
Implantable Lead Model: 1948
Implantable Lead Model: 5076
Implantable Pulse Generator Implant Date: 20220317
Lead Channel Impedance Value: 360 Ohm
Lead Channel Impedance Value: 510 Ohm
Lead Channel Pacing Threshold Amplitude: 0.5 V
Lead Channel Pacing Threshold Amplitude: 0.5 V
Lead Channel Pacing Threshold Pulse Width: 0.5 ms
Lead Channel Pacing Threshold Pulse Width: 0.5 ms
Lead Channel Sensing Intrinsic Amplitude: 12 mV
Lead Channel Sensing Intrinsic Amplitude: 2.2 mV
Lead Channel Setting Pacing Amplitude: 0.75 V
Lead Channel Setting Pacing Amplitude: 1.5 V
Lead Channel Setting Pacing Pulse Width: 0.5 ms
Lead Channel Setting Sensing Sensitivity: 7 mV
Pulse Gen Model: 2272
Pulse Gen Serial Number: 3908614

## 2023-08-09 ENCOUNTER — Encounter: Payer: Self-pay | Admitting: Cardiology

## 2023-08-17 ENCOUNTER — Encounter: Payer: Self-pay | Admitting: Cardiovascular Disease

## 2023-08-17 ENCOUNTER — Ambulatory Visit: Payer: TRICARE For Life (TFL) | Attending: Cardiovascular Disease | Admitting: Cardiovascular Disease

## 2023-08-17 VITALS — BP 130/50 | HR 67 | Ht 67.0 in | Wt 180.0 lb

## 2023-08-17 DIAGNOSIS — I2510 Atherosclerotic heart disease of native coronary artery without angina pectoris: Secondary | ICD-10-CM

## 2023-08-17 DIAGNOSIS — G4733 Obstructive sleep apnea (adult) (pediatric): Secondary | ICD-10-CM

## 2023-08-17 DIAGNOSIS — I495 Sick sinus syndrome: Secondary | ICD-10-CM | POA: Diagnosis not present

## 2023-08-17 DIAGNOSIS — Z7901 Long term (current) use of anticoagulants: Secondary | ICD-10-CM

## 2023-08-17 DIAGNOSIS — Z950 Presence of cardiac pacemaker: Secondary | ICD-10-CM

## 2023-08-17 DIAGNOSIS — I442 Atrioventricular block, complete: Secondary | ICD-10-CM | POA: Diagnosis not present

## 2023-08-17 DIAGNOSIS — E785 Hyperlipidemia, unspecified: Secondary | ICD-10-CM

## 2023-08-17 DIAGNOSIS — I10 Essential (primary) hypertension: Secondary | ICD-10-CM

## 2023-08-17 DIAGNOSIS — E039 Hypothyroidism, unspecified: Secondary | ICD-10-CM

## 2023-08-17 NOTE — Progress Notes (Signed)
 Cardiology Office Note    Date:  08/17/2023   ID:  Cameron [name], DOB April 22, 1941, MRN [id number]  PCP:  Marguarite Arbour, MD  Cardiologist:  Nicki Guadalajara, MD  EP: [name]  3 month follow-up evaluation  History of Present Illness:  Cameron [name] is a 83 y.o. 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  He had been evaluated at [facility] from the advanced heart failure team, Dr. Mayford Knife with reference to obstructive sleep apnea, and [name] for his EP and pacemaker.  He also has seen Dr. Dorisann Frames because of increasing shortness of breath.  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  He was also found to have aortic atherosclerosis with coronary calcification and enlarged pulmonary arteries with a peak PA pressure at 55 mm.  He was referred to Dr. Shirlee Latch who performed a diagnostic cardiac catheterization on August 11, 2017.  He was found to have normal left and right heart filling pressures.  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  He was found to have severe disease in his proximal circumflex, proximal OM1 proximal diagonal 3 and was felt that his dyspnea was the result of combination of parenchymal lung disease and coronary artery disease.  I was asked to perform invention to this bifurcation stenosis of his circumflex and OM1 vessel.  His procedure was difficult but successful with an excellent result in a 2.5 x 15 mm Resolute stent was inserted into the OM 1 vessel at the ostium and a 3.0 x 18 mm Resolute DES stent was inserted from the proximal circumflex into the mid AV groove circumflex beyond the  OM1 vessel.  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  That was the only time that I had seen the patient.  Apparently, the patient was recently seen by [name] who recommended he see me for his general cardiology care.  He presents for evaluation.    Since his intervention, he denies any anginal type symptoms.  He had noticed a rare episode of dizziness.  He has obstructive sleep apnea and has been on CPAP therapy for 2 months and was followed by Dr. Mayford Knife.   He underwent carotid ultrasound imaging in June 2019 which showed minimal to moderate bilateral atherosclerotic plaque.  A head CT without contrast did not show any evidence of acute intracranial abnormality and showed mild atrophy and chronic small vessel white matter ischemic changes.  He has been evaluated by [name] who follows his pacemaker.   He was evaluated by Azalee Course, PA with several occasions and was last seen in January 2021.  That time, he he had mild shortness of breath symptoms only when he performs strenuous activity.  He did not have any anginal type chest pain.  He was having lower extremity mild edema and he was given a prescription for Lasix to take on an as-needed basis.  I evaluated him in a telemedicine visit on July 05, 2019.  He has continued to be followed by Dr. Aram Beecham for his primary care.  He is scheduled to undergo a breathing test with Dr. Marchelle Gearing.  He has noticed his blood pressure to be elevated  as well as his heart rate in the upper 90s.  At that time, lisinopril was titrated to 20 mg and Bystolic was increased. He denies any anginal symptoms.  He has continued to use CPAP.  A download was obtained from April 02, 2019 through June 30, 2019 and he is meeting compliance with 97% of usage days.  Average CPAP use is 7 hours and 6 minutes.  At his set pressure of 7 cm, AHI is excellent at 1.3.   He was seen by Azalee Course, PA on September 05, 2019  his blood pressure was improved.  He had  presented to the emergency room in March 2021 with elevated potassium.   I saw him in September 2021.  He was not having any anginal symptomatology.  Blood pressure continued to be elevated despite taking lisinopril 20 mg and Bystolic 10 mg and I recommended further titration of lisinopril to 30 mg.  He has continued to to be evaluated by Dr. Marchelle Gearing for his interstitial lung disease.  He  has noticed further blood pressure elevation despite taking Bystolic 10 mg, lisinopril 20 mg.  He has a prescription for furosemide but rarely takes this.  He continues to be on DAPT with aspirin/Plavix.  He is on Esbriet for his interstitial lung disease.  He is diabetic on glimepiride in addition to Metformin.  He was recently started on a prednisone taper.  He continues to use CPAP with excellent compliance.    I saw him on  August 08 2020 and since his prior evaluation he was evaluated by [name] and is in need for pacemaker replacement as well as removal and replacement of his atrial lead.  He has continued to walk at least 1 mile 3 days/week.  He has continued to use CPAP and a download was obtained from February 14 through August 07, 2020 which continues to show excellent compliance with average use of 6 hours and 7 minutes per night.  At 7 cm water pressure AHI is excellent at 1.3/h.  He denies any anginal symptoms.  His blood pressure improved with his medication adjustments from his last office visit.    He was seen by Edd Fabian, NP in August 2022 and he was started on diltiazem 120 mg daily for increased palpitations.  He was recently evaluated by [name] on April 12, 2021 and it was felt at that time that the patient was in atrial flutter for approximately 2 weeks duration.  During that visit he was able to be paced to terminate his atrial flutter with an NIPS and with atrial pacing at 180 ms his atrial flutter terminated.  He continues to see Dr. Marchelle Gearing for his interstitial lung disease.  His  pulmonary function studies have indicated progression compared to 2 years previously.  He was recently felt by Dr. Marchelle Gearing that he may benefit from repeat right heart catheterization to further evaluate his pulmonary hypertension.  He has continued to be on Esbriet.    I saw him on May 09, 2021 at which time he denied any anginal symptoms.  His heart rate has been stable.  He has been using CPAP therapy.  He underwent a follow-up echo Doppler study April 08, 2021 which showed EF at 60 to 65% and calculated at 61% by 3D volume.  There was grade 2 diastolic dysfunction and mild LVH.  There was moderate elevation of pulmonary artery systolic pressure 54.8 mmHg.  There was moderate biatrial enlargement, mild to moderate mitral regurgitation and  moderate mitral annular calcification.  There was aortic sclerosis without stenosis.  A small PFO cannot be completely excluded.  He presents for evaluation.  I performed his right heart catheterization on May 29, 2021 which revealed mild pulmonary hypertension in this patient with interstitial lung disease with elevated RV at 54/12, PA pressure 55/18, mean PA pressure 31 mmHg, and PVR 2.6 WU.  His PW mean pressure was 17 mm with a V wave of 24 consistent with his documented mild to moderate mitral regurgitation.  He has been evaluated by EP and saw Maxine Glenn, PA-C for [name].  He had normal pacemaker function.  He was maintaining sinus rhythm with atrial fibrillation burden less than 1% by device and he was maintained on Xarelto.  I saw him on April 01, 2022 at which time he felt well but admitted to having occasional leg swelling.  He denied chest pain or shortness of breath.  He has been exercising at least 3 days/week.  He continues to be on Esbriet for his interstitial lung disease.  He is on telmisartan 80 mg, nebivolol 10 mg daily, and diltiazem 120 mg daily for hypertension.  He is unaware of recurrent AF.  He continues to be on  glimepiride for diabetes mellitus.  He is on Armour Thyroid 90 mg for hypothyroidism.  He continues to take pravastatin 20 mg for hyperlipidemia.    He was evaluated by Azalee Course, PA on July 11, 2022 following a recent hospitalization where he was admitted on May 23, 2022 with weakness and lightheaded this.  He was found to have hemoglobin of 6.0 on admission and repeat required 2 units of packed red blood cell transfusion.  He was evaluated by GI and upper endoscopy showed benign-appearing esophageal stenosis, normal stomach, and duodenal stenosis.  Colonoscopy revealed diverticulosis, nonbleeding internal hemorrhoids.  Capsule endoscopy was negative.  CT enterography  was negative for acute findings.  He was transition from Xarelto to Eliquis due to lower GI bleed profile.  During his evaluation with Wynema Birch, it was decided to restart Eliquis 5 mg twice a day and discontinue aspirin. Pravastatin 20 mg.  His heart rate was stable at 60 bpm on pacemaker.  An echo Doppler study in September 17, 2022 showed EF 45 to 50% and grade 2 diastolic dysfunction.  RV systolic function was moderately reduced.  There was severely elevated PA pressure at 61 mmHg.  There was severe left atrial dilatation and mild right atrial dilatation.  When I saw him on Sep 24, 2022 he was remaining stable and was trying to walk 1 mile 3 days/week and he also does some light resistance training.  He does admit to some occasional swelling of his right leg.  He apparently has been off Esbriet for 2 months.  He has lost 30 pounds over the past year.  He has issues with metallic taste in his mouth.  He is followed by Dr. Orlie Dakin of hematology and apparently saw him today.  He has been undergoing iron transfusion therapy.  Recent BNP on April 16 was 482.  T4 was 0.72.  He is on Eliquis 2.5 mg twice a day, Jardiance 10 mg, nebivolol 5 mg daily, telmisartan 80 mg, Armour Thyroid 90 mg, and takes pravastatin 20 mg daily.    Since his prior  evaluation, he has been evaluated by Dr. Steffanie Dunn for his sick sinus syndrome, PAF, pacemaker, as well as his anemia.  Apparently when seen he was only taking Eliquis 2.5 mg once  a day and was advised to at least take this twice a day although based on his age, renal function and weight 5 mg twice a day is the recommended dose.  The plan was to follow his hemoglobin to make certain this does not significantly drop and possibly reconsider consideration for Watchman device at a future evaluation.  He was evaluated by Azalee Course, and February 03, 2023.  With his echo showing slight reduction of LV function to 45 and 50%, Lexiscan Myoview was recommended which was done on February 24, 2023 and was interpreted as low risk.  EF 46%.  I last saw him on April 30, 2023 at which time he denied any chest pain or significant change in shortness of breath.  He has been undergoing iron infusions.  He tells me he lost 30 pounds since January but has gained some back.  He continues to have metabolic taste in his mouth.  He is taking Eliquis twice a day.  He also is on allopurinol and colchicine history of gout.  Cardiac wise he is on diltiazem CD1 20 mg, nebivolol 5 mg, spironolactone 12.5 mg and telmisartan 80 mg daily.  He is on Armour Thyroid.    Since I saw him, he has been seen by Dr. Judithann Sheen.  He underwent a follow-up echo Doppler study on July 28, 2023 which now showed normalization of LV function with EF at 60 to 65% without wall motion abnormalities.  There was restrictive grade 3 diastolic dysfunction and he had elevated left atrial pressure with the E prime and 22.  He had abnormal global longitudinal strain.  There was mild mitral regurgitation, moderate tricuspid regurgitation, aortic valve sclerosis without stenosis.  Compared to his prior echo, EF had improved from 45% to now 60 to 65%.  He continues to be on Eliquis 5 mg twice a day.  He is on nebivolol 5 mg, telmisartan 80 mg for hypertension.  He is on  pravastatin 20 mg for hyperlipidemia.  He takes Armour Thyroid 120 mg every morning.  He continues to take colchicine 0.6 mg daily and Zyloprim.  He continues to get iron therapy and is followed by Dr. Gerarda Fraction.  He presents for reevaluation.   Past Medical History:  Diagnosis Date   Anemia    Anxiety self recent   Appendicitis    Atrial fibrillation (HCC) 12/12/2013   Bone cancer [facility]) father  71   BPH (benign prostatic hypertrophy)    CAD (coronary artery disease) 12/2015   Cath by Dr Katrinka Blazing reveals distal and small vessel CAD.  Medical therapy advised.   Chest pain 12/03/2015   CHF (congestive heart failure) (HCC)    Complete heart block (HCC)    s/p PPM   Coronary artery disease    Coronary artery disease involving native coronary artery of native heart with unstable angina pectoris (HCC) 08/11/2017   D-dimer, elevated 04/03/2017   Depression self recent   Drug reaction 07/11/2021   GERD (gastroesophageal reflux disease)    History of blood transfusion 1968   "probably; related to getting wounded in [place]"   History of kidney stones    History of SCC (squamous cell carcinoma) of skin 07/24/2020   right upper arm/excision   Hyperglycemia 11/05/2013   Hyperlipidemia 11/05/2013   Hypertension    Hypothyroidism    Hypothyroidism, unspecified 11/05/2013   Inflammatory arthritis 11/05/2013   Onychomycosis 12/20/2015   OSA (obstructive sleep apnea) 10/26/2017    AHI of 8.1/h overall and 6.2/h  during REM sleep.  AHI was 20/h while supine.  Oxygen saturations dropped to 87%.  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   Pancreatic cancer [facility]) mother at age 48   Presence of permanent cardiac pacemaker 12/09/2011   Rheumatoid arthritis (HCC)    "hands" (08/11/2017)   Type II diabetes mellitus (HCC)     Past Surgical History:  Procedure Laterality Date   BACK SURGERY     BALLOON DILATION N/A 05/24/2022   Procedure: BALLOON DILATION;   Surgeon: Imogene Burn, MD;  Location: [facility] ENDOSCOPY;  Service: Gastroenterology;  Laterality: N/A;   CARDIAC CATHETERIZATION N/A 12/28/2015   Procedure: Left Heart Cath and Coronary Angiography;  Surgeon: Lyn Records, MD;  Location: [facility] INVASIVE CV LAB;  Service: Cardiovascular;  Laterality: N/A;   CATARACT EXTRACTION W/ INTRAOCULAR LENS  IMPLANT, BILATERAL Bilateral    COLONOSCOPY WITH PROPOFOL N/A 05/25/2022   Procedure: COLONOSCOPY WITH PROPOFOL;  Surgeon: Imogene Burn, MD;  Location: [facility] ENDOSCOPY;  Service: Gastroenterology;  Laterality: N/A;   CORONARY ANGIOPLASTY WITH STENT PLACEMENT  08/11/2017   "2 stents"   CORONARY STENT INTERVENTION N/A 08/11/2017   Procedure: CORONARY STENT INTERVENTION;  Surgeon: Lennette Bihari, MD;  Location: [facility] INVASIVE CV LAB;  Service: Cardiovascular;  Laterality: N/A;   CYSTOSCOPY W/ STONE MANIPULATION     ESOPHAGOGASTRODUODENOSCOPY (EGD) WITH PROPOFOL N/A 05/24/2022   Procedure: ESOPHAGOGASTRODUODENOSCOPY (EGD) WITH PROPOFOL;  Surgeon: Imogene Burn, MD;  Location: [facility] ENDOSCOPY;  Service: Gastroenterology;  Laterality: N/A;   GIVENS CAPSULE STUDY N/A 05/25/2022   Procedure: GIVENS CAPSULE STUDY;  Surgeon: Imogene Burn, MD;  Location: [facility] ENDOSCOPY;  Service: Gastroenterology;  Laterality: N/A;   INGUINAL HERNIA REPAIR Left    INSERT / REPLACE / REMOVE PACEMAKER  12/09/2011   SJM Accent DR RF implanted by DR Allred for complete heart block and syncope   JOINT REPLACEMENT     LAPAROSCOPIC CHOLECYSTECTOMY     LEAD REVISION/REPAIR N/A 08/09/2020   Procedure: LEAD REVISION/REPAIR;  Surgeon: Hillis Range, MD;  Location: [facility] INVASIVE CV LAB;  Service: Cardiovascular;  Laterality: N/A;   LITHOTRIPSY     LUMBAR DISC SURGERY     "removed arthritis and spurs"   PERMANENT PACEMAKER INSERTION N/A 12/09/2011   Procedure: PERMANENT PACEMAKER INSERTION;  Surgeon: Hillis Range, MD;  Location[facility] CATH LAB;  Service: Cardiovascular;  Laterality: N/A;   PPM GENERATOR  CHANGEOUT N/A 08/09/2020   Procedure: PPM GENERATOR CHANGEOUT;  Surgeon: Hillis Range, MD;  Location: [facility] INVASIVE CV LAB;  Service: Cardiovascular;  Laterality: N/A;   PROSTATE SURGERY     REPLACEMENT TOTAL KNEE Right    RIGHT HEART CATH N/A 05/29/2021   Procedure: RIGHT HEART CATH;  Surgeon: Lennette Bihari, MD;  Location: [facility] INVASIVE CV LAB;  Service: Cardiovascular;  Laterality: N/A;   RIGHT/LEFT HEART CATH AND CORONARY ANGIOGRAPHY N/A 08/11/2017   Procedure: RIGHT/LEFT HEART CATH AND CORONARY ANGIOGRAPHY;  Surgeon: Laurey Morale, MD;  Location: [facility] INVASIVE CV LAB;  Service: Cardiovascular;  Laterality: N/A;   TRANSURETHRAL RESECTION OF PROSTATE  2017/2018    Current Medications: Outpatient Medications Prior to Visit  Medication Sig Dispense Refill   acetaminophen (TYLENOL) 500 MG tablet Take 500 mg by mouth every 6 (six) hours as needed for moderate pain.     aspirin 81 MG EC tablet Take 81 mg by mouth daily.     [employer]S TRIPLE MAGNESIUM COMPLEX PO Take by mouth.  diltiazem (CARDIZEM CD) 120 MG 24 hr capsule Take 1 capsule (120 mg total) by mouth daily. 90 capsule 3   diltiazem (TIAZAC) 120 MG 24 hr capsule Take 120 mg by mouth.     ESBRIET 267 MG TABS TAKE 3 TABLETS THREE TIMES A DAY WITH MEALS (MAINTENANCE DOSE, START AFTER COMPLETED STARTER DOSE) 270 tablet 11   gabapentin (NEURONTIN) 300 MG capsule Take 300 mg by mouth at bedtime.     glimepiride (AMARYL) 2 MG tablet Take 2 mg by mouth every morning.     leflunomide (ARAVA) 10 MG tablet Take 10 mg by mouth daily.     metFORMIN (GLUCOPHAGE) 500 MG tablet Take 500 mg by mouth daily. 2 tablets daily     nebivolol (BYSTOLIC) 5 MG tablet Take 5 mg by mouth daily.     nitroGLYCERIN (NITROSTAT) 0.3 MG SL tablet Place under the tongue.     Omega-3 Fatty Acids (OMEGA-3 FISH OIL PO) Take 1 capsule by mouth daily.     pravastatin (PRAVACHOL) 20 MG tablet Take 1 tablet (20 mg total) by mouth every evening. *NEEDS OFFICE VISIT FOR FURTHER  REFILLS* 90 tablet 0   telmisartan (MICARDIS) 80 MG tablet Take 80 mg by mouth daily.     thyroid (ARMOUR) 90 MG tablet Take 90 mg by mouth every morning.      Trospium Chloride 60 MG CP24 TAKE 1 CAPSULE BY MOUTH DAILY 30 capsule 2   TURMERIC PO Take 1 capsule by mouth daily.     XARELTO 20 MG TABS tablet TAKE ONE TABLET BY MOUTH EVERY DAY WITH SUPPERDISCONTINUE CLOPIDROGEL 30 tablet 3   Facility-Administered Medications Prior to Visit  Medication Dose Route Frequency Provider Last Rate Last Admin   ondansetron (ZOFRAN) 4 mg in sodium chloride 0.9 % 50 mL IVPB  4 mg Intravenous Q6H PRN Coletta Memos, MD         Allergies:   Ace inhibitors, Lisinopril, Pirfenidone, and Celecoxib   Social History   Socioeconomic History   Marital status: Married    Spouse name: Not on file   Number of children: Not on file   Years of education: Not on file   Highest education level: Not on file  Occupational History   Not on file  Tobacco Use   Smoking status: Former    Current packs/day: 0.00    Average packs/day: 1 pack/day for 5.0 years (5.0 ttl pk-yrs)    Types: Cigarettes    Start date: 07/11/1967    Quit date: 07/10/1972    Years since quitting: 51.1   Smokeless tobacco: Former    Types: Chew    Quit date: 1975  Vaping Use   Vaping status: Never Used  Substance and Sexual Activity   Alcohol use: Not Currently    Alcohol/week: 1.0 standard drink of alcohol    Types: 1 Glasses of wine per week   Drug use: No   Sexual activity: Not Currently  Other Topics Concern   Not on file  Social History Narrative   Not on file   Social Drivers of Health   Financial Resource Strain: Low Risk  (01/22/2023)   Received from [facility]   Overall Financial Resource Strain (CARDIA)    Difficulty of Paying Living Expenses: Not hard at all  Food Insecurity: No Food Insecurity (07/22/2023)   Hunger Vital Sign    Worried About Running Out of Food in the Last Year: Never true    Ran  Out of  Food in the Last Year: Never true  Transportation Needs: No Transportation Needs (07/22/2023)   PRAPARE - Administrator, Civil Service (Medical): No    Lack of Transportation (Non-Medical): No  Physical Activity: Not on file  Stress: Not on file  Social Connections: Unknown (07/22/2023)   Social Connection and Isolation Panel [NHANES]    Frequency of Communication with Friends and Family: Patient declined    Frequency of Social Gatherings with Friends and Family: Patient declined    Attends Religious Services: Patient declined    Database administrator or Organizations: Patient declined    Attends Banker Meetings: Patient declined    Marital Status: Married     Family History:  The patient's family history includes Alzheimer's disease in his paternal grandmother and sister; Arthritis in his maternal grandfather; Bone cancer in his father; Cancer in his father and mother; Heart attack in his paternal uncle; Lung cancer in his paternal grandfather; Pancreatic cancer in his mother.   ROS General: Negative; No fevers, chills, or night sweats;  HEENT: Negative; No changes in vision or hearing, sinus congestion, difficulty swallowing Pulmonary: Interstitial lung disease, reported exposure to agent orange Cardiovascular: See HPI GI: Negative; No nausea, vomiting, diarrhea, or abdominal pain GU: BPH followed by [facility] urology Musculoskeletal: Negative; no myalgias, joint pain, or weakness Hematologic/Oncology: Undergoing iron infusions with Dr. Orlie Dakin Endocrine: Negative; no heat/cold intolerance; no diabetes Neuro: Negative; no changes in balance, headaches Skin: Negative; No rashes or skin lesions Psychiatric: Negative; No behavioral problems, depression Sleep: OSA on CPAP, no breakthrough snoring.  No daytime sleepiness. Other comprehensive 14 point system review is negative.   PHYSICAL EXAM:   VS:  BP (!) 130/50 (BP Location: Left Arm, Patient  Position: Sitting, Cuff Size: Normal)   Pulse 67   Ht 5\' 7"  (1.702 m)   Wt 180 lb (81.6 kg)   SpO2 97%   BMI 28.19 kg/m    Repeat blood pressure by me was 126/60    Wt Readings from Last 3 Encounters:  08/17/23 180 lb (81.6 kg)  07/22/23 182 lb 8.7 oz (82.8 kg)  07/21/23 175 lb (79.4 kg)    General: Alert, oriented, no distress.  Skin: normal turgor, no rashes, warm and dry HEENT: Normocephalic, atraumatic. Pupils equal round and reactive to light; sclera anicteric; extraocular muscles intact;  Nose without nasal septal hypertrophy Mouth/Parynx benign; Mallinpatti scale 3 Neck: No JVD, no carotid bruits; normal carotid upstroke Lungs: clear to ausculatation and percussion; no wheezing or rales Chest wall: without tenderness to palpitation Heart: PMI not displaced, RRR, s1 s2 normal, 1/6 systolic murmur, no diastolic murmur, no rubs, gallops, thrills, or heaves Abdomen: soft, nontender; no hepatosplenomehaly, BS+; abdominal aorta nontender and not dilated by palpation. Back: no CVA tenderness Pulses 2+ Musculoskeletal: full range of motion, normal strength, no joint deformities Extremities: no clubbing cyanosis or edema, Homan's sign negative  Neurologic: grossly nonfocal; Cranial nerves grossly wnl Psychologic: Normal mood and affect   Studies/Labs Reviewed:    EKG Interpretation Date/Time:  Monday August 17 2023 13:24:15 EDT Ventricular Rate:  67 PR Interval:  164 QRS Duration:  172 QT Interval:  438 QTC Calculation: 462 R Axis:   -82  Text Interpretation: AV dual-paced rhythm When compared with ECG of 30-Apr-2023 09:16, Vent. rate has increased BY   5 BPM Confirmed by Nicki Guadalajara ([id number]) on 08/17/2023 3:59:26 PM    April 30, 2023 ECG (independently read by me): AV paced rhythm at 62  bpm  Sep 24, 2022 ECG (independently read by me): Underlying atrial flutter with V pacing at 70 bpm  April 01, 2022 ECG (independently read by me): AV paced at 67  May 09, 2021 ECG (independently read by me): AV paced at 67  August 08, 2020  ECG (independently read by me): AV paced at 55  September 2021 ECG (independently read by me): AV paced rhythm at 61 bpm  Recent Labs:    Latest Ref Rng & Units 07/22/2023    4:50 AM 07/21/2023    3:06 PM 01/12/2023   12:02 PM  BMP  Glucose 70 - 99 mg/dL 161  90  096   BUN 8 - 23 mg/dL 42  57  29   Creatinine 0.61 - 1.24 mg/dL 0.45  4.09  8.11   Sodium 135 - 145 mmol/L 138  137  134   Potassium 3.5 - 5.1 mmol/L 4.6  5.2  4.4   Chloride 98 - 111 mmol/L 110  107  105   CO2 22 - 32 mmol/L 24  23  22    Calcium 8.9 - 10.3 mg/dL 9.3  9.6  9.1         Latest Ref Rng & Units 08/28/2022    4:31 PM 05/30/2022    4:44 AM 05/24/2022    6:31 AM  Hepatic Function  Total Protein 6.0 - 8.3 g/dL 6.7  5.1  5.0   Albumin 3.5 - 5.2 g/dL 3.8  2.4  2.6   AST 0 - 37 U/L 25  17  15    ALT 0 - 53 U/L 29  17  11    Alk Phosphatase 39 - 117 U/L 193  62  45   Total Bilirubin 0.2 - 1.2 mg/dL 0.5  0.5  0.7   Bilirubin, Direct 0.0 - 0.3 mg/dL 0.2          Latest Ref Rng & Units 07/21/2023   10:08 PM 06/01/2023   11:25 AM 03/13/2023    2:00 PM  CBC  WBC 4.0 - 10.5 K/uL 9.8  8.1  6.1   Hemoglobin 13.0 - 17.0 g/dL 91.4  78.2  95.6   Hematocrit 39.0 - 52.0 % 40.6  38.4  38.3   Platelets 150 - 400 K/uL 173  172  168.0    Lab Results  Component Value Date   MCV 97.4 07/21/2023   MCV 96.2 06/01/2023   MCV 100.5 (H) 03/13/2023   Lab Results  Component Value Date   TSH 0.832 07/22/2023   No results found for: "HGBA1C"   BNP No results found for: "BNP"  ProBNP    Component Value Date/Time   PROBNP 482.0 (H) 09/09/2022 1446     Lipid Panel  No results found for: "CHOL", "TRIG", "HDL", "CHOLHDL", "VLDL", "LDLCALC", "LDLDIRECT", "LABVLDL"   RADIOLOGY: CUP PACEART REMOTE DEVICE CHECK Result Date: 08/06/2023 Scheduled remote reviewed. Normal device function.  Presenting rhythm: AP-VP. Next remote 91 days. - CS,  CVRS  ECHOCARDIOGRAM COMPLETE Result Date: 07/28/2023    ECHOCARDIOGRAM REPORT   Patient Name:   Cameron [name] Date of Exam: 07/28/2023 Medical Rec #:  [id number]       Height:       67.0 in Accession #:    [id number]      Weight:       182.5 lb Date of Birth:  07/16/1940       BSA:          1.945 m Patient Age:  82 years        BP:           140/54 mmHg Patient Gender: M               HR:           63 bpm. Exam Location:  Outpatient Procedure: 2D Echo, Cardiac Doppler, Color Doppler and Strain Analysis (Both            Spectral and Color Flow Doppler were utilized during procedure). Indications:    R06.9 DOE; R60.0 Lower extremity edema  History:        Patient has prior history of Echocardiogram examinations, most                 recent 09/17/2022. CHF, CAD, Pacemaker, Pulmonary HTN,                 Arrythmias:Complete Heart Block and Atrial Fibrillation,                 Signs/Symptoms:Dyspnea and Edema; Risk Factors:Diabetes, Sleep                 Apnea, Hypertension, Dyslipidemia, Former Smoker and CKD stage                 3b. Patient denies chest pain. He does have DOE and right leg                 edema.  Sonographer:    Carlos American RVT, RDCS (AE), RDMS Referring Phys: 62 Niobe Dick A Marisa Hage  Sonographer Comments: Global longitudinal strain was attempted. IMPRESSIONS  1. Left ventricular ejection fraction, by estimation, is 60 to 65%. Left ventricular ejection fraction by 3D volume is 64 %. The left ventricle has normal function. The left ventricle has no regional wall motion abnormalities. Left ventricular diastolic  parameters are consistent with Grade III diastolic dysfunction (restrictive). Elevated left atrial pressure. The E/e' is 22. The average left ventricular global longitudinal strain is -13.0 %. The global longitudinal strain is abnormal.  2. Right ventricular systolic function is low normal. The right ventricular size is mildly enlarged. There is severely elevated pulmonary artery systolic  pressure. The estimated right ventricular systolic pressure is 68.5 mmHg.  3. Left atrial size was severely dilated.  4. The mitral valve is degenerative. Mild mitral valve regurgitation. No evidence of mitral stenosis.  5. Tricuspid valve regurgitation is moderate.  6. The aortic valve is tricuspid. Aortic valve regurgitation is mild. Aortic valve sclerosis is present, with no evidence of aortic valve stenosis.  7. The inferior vena cava is dilated in size with >50% respiratory variability, suggesting right atrial pressure of 8 mmHg. Comparison(s): EF 45%, global hypokinesis, mild AI. FINDINGS  Left Ventricle: Left ventricular ejection fraction, by estimation, is 60 to 65%. Left ventricular ejection fraction by 3D volume is 64 %. The left ventricle has normal function. The left ventricle has no regional wall motion abnormalities. The average left ventricular global longitudinal strain is -13.0 %. Strain was performed and the global longitudinal strain is abnormal. The left ventricular internal cavity size was normal in size. There is no left ventricular hypertrophy. Left ventricular diastolic parameters are consistent with Grade III diastolic dysfunction (restrictive). Elevated left atrial pressure. The E/e' is 22. Right Ventricle: The right ventricular size is mildly enlarged. Right vetricular wall thickness was not well visualized. Right ventricular systolic function is low normal. There is severely elevated pulmonary artery systolic pressure. The tricuspid regurgitant velocity is 3.89  m/s, and with an assumed right atrial pressure of 8 mmHg, the estimated right ventricular systolic pressure is 68.5 mmHg. Left Atrium: Left atrial size was severely dilated. Right Atrium: Right atrial size was normal in size. Pericardium: There is no evidence of pericardial effusion. Mitral Valve: The mitral valve is degenerative in appearance. Mild mitral annular calcification. Mild mitral valve regurgitation. No evidence of  mitral valve stenosis. Tricuspid Valve: The tricuspid valve is grossly normal. Tricuspid valve regurgitation is moderate . No evidence of tricuspid stenosis. Aortic Valve: The aortic valve is tricuspid. Aortic valve regurgitation is mild. Aortic valve sclerosis is present, with no evidence of aortic valve stenosis. Aortic valve mean gradient measures 5.0 mmHg. Aortic valve peak gradient measures 10.5 mmHg. Aortic valve area, by VTI measures 1.91 cm. Pulmonic Valve: The pulmonic valve was normal in structure. Pulmonic valve regurgitation is mild. No evidence of pulmonic stenosis. Aorta: The aortic root and ascending aorta are structurally normal, with no evidence of dilitation. Venous: The inferior vena cava is dilated in size with greater than 50% respiratory variability, suggesting right atrial pressure of 8 mmHg. IAS/Shunts: The atrial septum is grossly normal. Additional Comments: 3D was performed not requiring image post processing on an independent workstation and was normal. A device lead is visualized.  LEFT VENTRICLE PLAX 2D LVIDd:         4.49 cm         Diastology LVIDs:         2.86 cm         LV e' medial:    4.67 cm/s LV PW:         0.94 cm         LV E/e' medial:  29.8 LV IVS:        0.88 cm         LV e' lateral:   11.10 cm/s LVOT diam:     1.83 cm         LV E/e' lateral: 12.5 LV SV:         62 LV SV Index:   32              2D Longitudinal LVOT Area:     2.63 cm        Strain                                2D Strain GLS   -13.0 %                                Avg:                                 3D Volume EF                                LV 3D EF:    Left                                             ventricul  ar                                             ejection                                             fraction                                             by 3D                                             volume is                                              64 %.                                 3D Volume EF:                                3D EF:        64 %                                LV EDV:       117 ml                                LV ESV:       43 ml                                LV SV:        74 ml RIGHT VENTRICLE RV S prime:     9.23 cm/s TAPSE (M-mode): 1.9 cm LEFT ATRIUM              Index        RIGHT ATRIUM           Index LA diam:        5.78 cm  2.97 cm/m   RA Area:     18.00 cm LA Vol (A2C):   107.0 ml 55.01 ml/m  RA Volume:   51.90 ml  26.68 ml/m LA Vol (A4C):   94.1 ml  48.35 ml/m LA Biplane Vol: 97.8 ml  50.28 ml/m  AORTIC VALVE                     PULMONIC VALVE AV Area (Vmax):    1.74 cm      PV Vmax:       0.97 m/s AV Area (Vmean):   1.76 cm  PV Peak grad:  3.8 mmHg AV Area (VTI):     1.91 cm AV Vmax:           162.00 cm/s AV Vmean:          106.000 cm/s AV VTI:            0.322 m AV Peak Grad:      10.5 mmHg AV Mean Grad:      5.0 mmHg LVOT Vmax:         107.00 cm/s LVOT Vmean:        70.800 cm/s LVOT VTI:          0.234 m LVOT/AV VTI ratio: 0.73 AR Vena Contracta: 0.44 cm  AORTA Ao Root diam: 3.28 cm Ao Asc diam:  3.35 cm Ao Arch diam: 2.7 cm MITRAL VALVE                  TRICUSPID VALVE MV Area (PHT): 4.89 cm       TR Peak grad:   60.5 mmHg MV Decel Time: 155 msec       TR Vmax:        389.00 cm/s MR Peak grad:    102.8 mmHg MR Vmax:         507.00 cm/s  SHUNTS MR PISA Nyquist: 2.3 m/s      Systemic VTI:  0.23 m MR PISA:         3.58 cm     Systemic Diam: 1.83 cm MR PISA Radius:  0.76 cm MV E velocity: 139.00 cm/s MV A velocity: 64.50 cm/s MV E/A ratio:  2.16 Sunit Tolia Electronically signed by Tessa Lerner Signature Date/Time: 07/28/2023/5:32:14 PM    Final    US RENAL Result Date: 07/22/2023 CLINICAL DATA:  [id number] AKI (acute kidney injury) (HCC) [id number] EXAM: RENAL / URINARY TRACT ULTRASOUND COMPLETE COMPARISON:  CT abdomen pelvis 01/24/2022 FINDINGS: Right Kidney: Renal measurements: 11.5 x 5.3 x 4.2 cm = volume: 132  mL. Echogenicity increased. No mass or hydronephrosis visualized. Left Kidney: Renal measurements: 11.1 x 6 x 4.4 cm = volume: 154 mL. Echogenicity increased. No mass or hydronephrosis visualized. Urinary bladder: Appears normal for degree of bladder distention. Other: None. 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 Electronically Signed   By: Tish Frederickson M.D.   On: 07/22/2023 09:40      Additional studies/ records that were reviewed today include:   ECHO 1/12021  MPRESSIONS   1. Left ventricular ejection fraction, by visual estimation, is 50 to  55%. The left ventricle has normal function. There is no left ventricular  hypertrophy.   2. Elevated left atrial pressure.   3. Left ventricular diastolic parameters are consistent with Grade II  diastolic dysfunction (pseudonormalization).   4. The left ventricle has no regional wall motion abnormalities.   5. Global right ventricle has normal systolic function.The right  ventricular size is mildly enlarged.   6. Left atrial size was normal.   7. Right atrial size was normal.   8. Mild mitral annular calcification.   9. The mitral valve is normal in structure. Mild mitral valve  regurgitation. No evidence of mitral stenosis.  10. The tricuspid valve is normal in structure.  11. The aortic valve is tricuspid. Aortic valve regurgitation is trivial.  Mild aortic valve sclerosis without stenosis.  12. The pulmonic valve was normal in structure. Pulmonic valve  regurgitation is trivial.  13. Moderately elevated pulmonary artery systolic pressure.  14. A pacer  wire is visualized.  15. The inferior vena cava is normal in size with greater than 50%  respiratory variability, suggesting right atrial pressure of 3 mmHg.  16. Normal LV systolic function; grade 2 diastolic dysfunction; trace AI;  mild MR; mild RVE; moderate pulmonary hypertension.   In comparison to the previous echocardiogram(s): 09/17/17  EF 55-60%. PA  pressure .     ECHO: 04/08/2021 IMPRESSIONS   1. Left ventricular ejection fraction, by estimation, is 60 to 65%. Left  ventricular ejection fraction by 3D volume is 61 %. The left ventricle has  normal function. The left ventricle has no regional wall motion  abnormalities. There is mild left  ventricular hypertrophy. Left ventricular diastolic parameters are  consistent with Grade II diastolic dysfunction (pseudonormalization).   2. Right ventricular systolic function is normal. The right ventricular  size is normal. There is moderately elevated pulmonary artery systolic  pressure. The estimated right ventricular systolic pressure is 54.8 mmHg.   3. Left atrial size was moderately dilated.   4. Right atrial size was moderately dilated.   5. The mitral valve is abnormal. Mild to moderate mitral valve  regurgitation. Moderate mitral annular calcification.   6. The tricuspid valve is abnormal. Tricuspid valve regurgitation is  moderate.   7. The aortic valve is tricuspid. Aortic valve regurgitation is trivial.  Aortic valve sclerosis is present, with no evidence of aortic valve  stenosis. Aortic regurgitation PHT measures 594 msec.   8. Cannot exclude a small PFO.   Comparison(s): Changes from prior study are noted. 06/07/2019: LVEF 50-55%.    R HEART CATH: 05/29/2021 Mild pulmonary hypertension in this patient with interstitial lung disease with elevated RV 54/12; PA pressure 55/18; mean 31 mmHg.  PVR 2.6 WU.    PW mean pressure is 17 mmHg, with V wave 24 c/w his echo documented mild - moderate mitral regurgitation.    The patient will resume his home medications commencing tomorrow including resumption of Xarelto.   ECHO; 09/17/2022   1. Left ventricular ejection fraction, by estimation, is 45 to 50%. Left  ventricular ejection fraction by 2D MOD biplane is 43.7 %. The left  ventricle has mildly decreased function. The left ventricle demonstrates  global  hypokinesis. Left ventricular  diastolic parameters are consistent with Grade II diastolic dysfunction  (pseudonormalization). Elevated left ventricular end-diastolic pressure.   2. Right ventricular systolic function is moderately reduced. The right  ventricular size is normal. There is severely elevated pulmonary artery  systolic pressure.   3. Left atrial size was severely dilated.   4. Right atrial size was mildly dilated.   5. The mitral valve is normal in structure. Mild mitral valve  regurgitation. No evidence of mitral stenosis.   6. Tricuspid valve regurgitation is moderate.   7. The aortic valve is tricuspid. Aortic valve regurgitation is mild. No  aortic stenosis is present. Aortic regurgitation PHT measures 592 msec.   8. The inferior vena cava is normal in size with greater than 50%  respiratory variability, suggesting right atrial pressure of 3 mmHg.    LEXISCAN MYOVIEW: 02/24/2023    Findings are equivocal. The study is low risk.   No ST deviation was noted.   Left ventricular function is abnormal. Global function is mildly reduced. Nuclear stress EF: 46%. The left ventricular ejection fraction is mildly decreased (45-54%). End diastolic cavity size is normal.   Prior study available for comparison from 05/22/2017.   Small fixed apical defect can be scar vs. Apical thinning;  inferior defect that is worse with rest, suspicious for diaphragmatic attenuation. Wall motion in the septum can reflect V pacing.     ASSESSMENT:    1. SSS (sick sinus syndrome) (HCC)   2. Complete heart block (HCC)   3. Pacemaker   4. Primary hypertension   5. Coronary artery disease involving native coronary artery of native heart without angina pectoris   6. OSA (obstructive sleep apnea)   7. Hyperlipidemia LDL goal <70   8. Essential hypertension   9. Hypothyroidism, unspecified type   10. Anticoagulant long-term use      PLAN:  1. CAD: He is status post PCI with DES stenting to his  left circumflex and OM1 vessel on August 12, 2019.  Really he is chest pain-free without anginal symptoms.  A Lexiscan Myoview study on February 24, 2023 was low risk with nuclear stress EF at 46%.   2.  Essential hypertension: Blood pressure today is stable on telmisartan 80 mg, Livalo 5 mg daily.  Since his last visit with me, he is no longer on spironolactone or diltiazem CD.  3.  Dyspnea: At cardiac catheterization in 2019 he had normal left and right filling pressures with mild pulmonary hypertension.  On echo Doppler study in January 2021 he had  grade 2 diastolic dysfunction, trace AI, mild MR, mild RVE and moderate pulmonary hypertension with estimated RV systolic pressure of 48.7 mm.  He has interstitial lung disease followed by Dr. Marchelle Gearing. Echo Doppler study from April 08, 2021 showed normal systolic function with EF at 60 to 65% and grade 2 diastolic dysfunction.  Estimated pulmonary systolic pressure is now further increased at 54.8 mmHg.  There is evidence for moderate biatrial enlargement, mild to moderate mitral regurgitation with mitral annular calcification and aortic valve sclerosis.  On right heart catheterization performed by me on May 29, 2021 pulmonary hypertension PA pressure 55/18, mean 31.  PVR 2.6 Woods units.  PW mean pressure was 17 with V wave up to 24 consistent with his mild to moderate mitral regurgitation.  Echo from September 17, 2022 demonstrated EF 45 to 50%, grade 2 diastolic dysfunction, and estimated PA pressure at 61 mmHg.  He is no longer on Esbriet.  Early reviewed his most recent echo from July 28, 2023.  LV function is now improved and EF was 60 to 65% without wall motion abnormalities.  He had restrictive grade 3 diastolic dysfunction with elevated left atrial pressure and abnormal global strain.  There was mild MR, moderate TR, and aortic sclerosis without stenosis.  EF had improved from 45% now to 60 to 65%.  4.  Leg swelling right greater than left: Improved  with initiation of spironolactone which is also helpful as an aldosterone blocker with his mildly reduced LV function.  Presently, there is no significant edema and he is no longer taking lisinopril.  5.  Pacemaker/atrial fibrillation/atrial flutter: ECG today shows AV dual paced rhythm without ectopy.  In the past, Dr. Steffanie Dunn had discussed with him possible Watchman device in light of potential anemic issues and anticoagulation.  He has not been reevaluated by Dr. Lalla Brothers and I have suggested a follow-up assessment will schedule this to be done in June 2025 for further evaluation.  7.  Hyperlipidemia: He continues to be on pravastatin 20 mg daily.  Reviewed most recent lipid studies done at [facility] on March 26, 2023.  Total cholesterol was 97, triglycerides 47, LDL 45 and VLDL 9.  8.  BPH:  followed  by [facility] urology  9.  Iron deficiency: He continues to undergo iron infusions by Dr. Orlie Dakin of hematology.  9.  Type 2 diabetes mellitus: He has lost over 30 pounds.  He is no longer on metformin nor Jardiance, followed by primary care provider  10.  Hypothyroidism: On Armour Thyroid currently at 120 mg mg daily, followed by Dr. Judithann Sheen.    I discussed with him my plans for upcoming retirement in several months.  He will see Dr. Lalla Brothers in several months and will need to be transitioned to another primary cardiologist subsequent to that office visit.  I have suggestefdDr. Croitoru.  Medication Adjustments/Labs and Tests Ordered: Current medicines are reviewed at length with the patient today.  Concerns regarding medicines are outlined above.  Medication changes, Labs and Tests ordered today are listed in the Patient Instructions below.   Patient Instructions  Medication Instructions:  No medication changes were made during today's visit.  *If you need a refill on your cardiac medications before your next appointment, please call your pharmacy*   Lab Work: No labs were  ordered during today's visit.  If you have labs (blood work) drawn today and your tests are completely normal, you will receive your results only by: MyChart Message (if you have MyChart) OR A paper copy in the mail If you have any lab test that is abnormal or we need to change your treatment, we will call you to review the results.   Testing/Procedures: No procedures were ordered during today's visit.    Follow-Up: At [facility], you and your health needs are our priority.  As part of our continuing mission to provide you with exceptional heart care, we have created designated Provider Care Teams.  These Care Teams include your primary Cardiologist (physician) and Advanced Practice Providers (APPs -  Physician Assistants and Nurse Practitioners) who all work together to provide you with the care you need, when you need it.  We recommend signing up for the patient portal called "MyChart".  Sign up information is provided on this After Visit Summary.  MyChart is used to connect with patients for Virtual Visits (Telemedicine).  Patients are able to view lab/test results, encounter notes, upcoming appointments, etc.  Non-urgent messages can be sent to your provider as well.   To learn more about what you can do with MyChart, go to [email or web address].    Your next appointment:   6 month(s)  Provider:   Dr. Steffanie Dunn     Other Instructions [facility]ENTER  [address] [place], [place] [address] OPENING [address] [phone]       1st Floor: - Lobby - Registration  - Pharmacy  - Lab - Cafe   2nd Floor: - PV Lab - Diagnostic Testing (echo, CT, nuclear med)   3rd Floor: - Vacant   4th Floor: - TCTS (cardiothoracic surgery) - AFib Clinic - Structural Heart Clinic - Vascular Surgery  - Vascular Ultrasound   5th Floor: - HeartCare Cardiology (general and EP) - Clinical Pharmacy for coumadin, hypertension, lipid, weight-loss medications,  and med management appointments      Valet parking services will be available as well.           Signed, Nicki Guadalajara, MD  08/17/2023 4:09 PM    [facility] [address], [place], [place]  [address] Phone: [phone]

## 2023-08-17 NOTE — Patient Instructions (Addendum)
 Medication Instructions:  No medication changes were made during today's visit.  *If you need a refill on your cardiac medications before your next appointment, please call your pharmacy*   Lab Work: No labs were ordered during today's visit.  If you have labs (blood work) drawn today and your tests are completely normal, you will receive your results only by: MyChart Message (if you have MyChart) OR A paper copy in the mail If you have any lab test that is abnormal or we need to change your treatment, we will call you to review the results.   Testing/Procedures: No procedures were ordered during today's visit.    Follow-Up: At [facility], you and your health needs are our priority.  As part of our continuing mission to provide you with exceptional heart care, we have created designated Provider Care Teams.  These Care Teams include your primary Cardiologist (physician) and Advanced Practice Providers (APPs -  Physician Assistants and Nurse Practitioners) who all work together to provide you with the care you need, when you need it.  We recommend signing up for the patient portal called "MyChart".  Sign up information is provided on this After Visit Summary.  MyChart is used to connect with patients for Virtual Visits (Telemedicine).  Patients are able to view lab/test results, encounter notes, upcoming appointments, etc.  Non-urgent messages can be sent to your provider as well.   To learn more about what you can do with MyChart, go to [email or web address].    Your next appointment:   6 month(s)  Provider:   Dr. Steffanie Dunn     Other Instructions [facility]ENTER  [address] [place], [place] [address] OPENING [address]IL [phone]       1st Floor: - Lobby - Registration  - Pharmacy  - Lab - Cafe   2nd Floor: - PV Lab - Diagnostic Testing (echo, CT, nuclear med)   3rd Floor: - Vacant   4th Floor: - TCTS (cardiothoracic surgery) -  AFib Clinic - Structural Heart Clinic - Vascular Surgery  - Vascular Ultrasound   5th Floor: - HeartCare Cardiology (general and EP) - Clinical Pharmacy for coumadin, hypertension, lipid, weight-loss medications, and med management appointments      Valet parking services will be available as well.

## 2023-08-25 ENCOUNTER — Encounter: Payer: Self-pay | Admitting: Emergency Medicine

## 2023-08-25 ENCOUNTER — Other Ambulatory Visit: Payer: Self-pay

## 2023-08-25 ENCOUNTER — Emergency Department
Admission: EM | Admit: 2023-08-25 | Discharge: 2023-08-25 | Disposition: A | Discharge status: Home / Self Care | Attending: Emergency Medicine | Admitting: Emergency Medicine

## 2023-08-25 DIAGNOSIS — E875 Hyperkalemia: Secondary | ICD-10-CM | POA: Insufficient documentation

## 2023-08-25 DIAGNOSIS — N189 Chronic kidney disease, unspecified: Secondary | ICD-10-CM | POA: Insufficient documentation

## 2023-08-25 DIAGNOSIS — R799 Abnormal finding of blood chemistry, unspecified: Secondary | ICD-10-CM | POA: Diagnosis present

## 2023-08-25 LAB — CBC WITH DIFFERENTIAL/PLATELET
Abs Immature Granulocytes: 0.03 10*3/uL (ref 0.00–0.07)
Basophils Absolute: 0.1 10*3/uL (ref 0.0–0.1)
Basophils Relative: 1 %
Eosinophils Absolute: 0.4 10*3/uL (ref 0.0–0.5)
Eosinophils Relative: 4 %
HCT: 41.6 % (ref 39.0–52.0)
Hemoglobin: 14 g/dL (ref 13.0–17.0)
Immature Granulocytes: 0 %
Lymphocytes Relative: 44 %
Lymphs Abs: 3.7 10*3/uL (ref 0.7–4.0)
MCH: 32.3 pg (ref 26.0–34.0)
MCHC: 33.7 g/dL (ref 30.0–36.0)
MCV: 95.9 fL (ref 80.0–100.0)
Monocytes Absolute: 0.9 10*3/uL (ref 0.1–1.0)
Monocytes Relative: 10 %
Neutro Abs: 3.6 10*3/uL (ref 1.7–7.7)
Neutrophils Relative %: 41 %
Platelets: 204 10*3/uL (ref 150–400)
RBC: 4.34 MIL/uL (ref 4.22–5.81)
RDW: 14.1 % (ref 11.5–15.5)
WBC: 8.8 10*3/uL (ref 4.0–10.5)
nRBC: 0 % (ref 0.0–0.2)

## 2023-08-25 LAB — COMPREHENSIVE METABOLIC PANEL WITH GFR
ALT: 28 U/L (ref 0–44)
AST: 28 U/L (ref 15–41)
Albumin: 4.4 g/dL (ref 3.5–5.0)
Alkaline Phosphatase: 99 U/L (ref 38–126)
Anion gap: 12 (ref 5–15)
BUN: 52 mg/dL — ABNORMAL HIGH (ref 8–23)
CO2: 22 mmol/L (ref 22–32)
Calcium: 10.4 mg/dL — ABNORMAL HIGH (ref 8.9–10.3)
Chloride: 104 mmol/L (ref 98–111)
Creatinine, Ser: 1.58 mg/dL — ABNORMAL HIGH (ref 0.61–1.24)
GFR, Estimated: 43 mL/min — ABNORMAL LOW (ref >60)
Glucose, Bld: 52 mg/dL — ABNORMAL LOW (ref 70–99)
Potassium: 5.3 mmol/L — ABNORMAL HIGH (ref 3.5–5.1)
Sodium: 138 mmol/L (ref 135–145)
Total Bilirubin: 0.4 mg/dL (ref 0.0–1.2)
Total Protein: 8.4 g/dL — ABNORMAL HIGH (ref 6.5–8.1)

## 2023-08-25 LAB — URINALYSIS, ROUTINE W REFLEX MICROSCOPIC
Bilirubin Urine: NEGATIVE
Glucose, UA: NEGATIVE mg/dL
Hgb urine dipstick: NEGATIVE
Ketones, ur: NEGATIVE mg/dL
Leukocytes,Ua: NEGATIVE
Nitrite: NEGATIVE
Protein, ur: NEGATIVE mg/dL
Specific Gravity, Urine: 1.015 (ref 1.005–1.030)
pH: 5 (ref 5.0–8.0)

## 2023-08-25 MED ORDER — SODIUM ZIRCONIUM CYCLOSILICATE 5 G PO PACK
5.0000 g | PACK | Freq: Once | ORAL | Status: AC
  Administered 2023-08-25: 5 g via ORAL
  Filled 2023-08-25: qty 1

## 2023-08-25 NOTE — ED Provider Triage Note (Signed)
 Emergency Medicine Provider Triage Evaluation Note  Cameron [name] , a 83 y.o. male  was evaluated in triage.  Pt complains of abnormal labs. Had blood work done this morning and was called about his potassium being high, 6.4.  Review of Systems  Positive: weakness Negative: CP, SOB, palpitations, nausea, vomiting  Physical Exam  There were no vitals taken for this visit. Gen:   Awake, no distress   Resp:  Normal effort  MSK:   Moves extremities without difficulty  Other:    Medical Decision Making  Medically screening exam initiated at 5:16 PM.  Appropriate orders placed.  [name] was informed that the remainder of the evaluation will be completed by another provider, this initial triage assessment does not replace that evaluation, and the importance of remaining in the ED until their evaluation is complete.     Cameron Ali, PA-C 08/25/23 1719

## 2023-08-25 NOTE — ED Triage Notes (Signed)
 Patient to ED via POV for abnormal lab- potassium 6.4. Labs drawn this AM at PCP. States he is feeling a little weak. Denies CP or SOB.

## 2023-08-25 NOTE — ED Provider Notes (Signed)
 [facility] Provider Note   Event Date/Time   First MD Initiated Contact with Patient 08/25/23 1955     (approximate) History  Abnormal Lab  HPI QUARAN KEDZIERSKI is a 83 y.o. male with a stated past medical history of CKD and recurrent hyperkalemia who presents after he was found incidentally to have a potassium of 6.3.  Patient denies any symptoms at this time.  Patient states that he normally sees Dr. Mosetta Pigeon in nephrology. ROS: Patient currently denies any vision changes, tinnitus, difficulty speaking, facial droop, sore throat, chest pain, shortness of breath, abdominal pain, nausea/vomiting/diarrhea, dysuria, or weakness/numbness/paresthesias in any extremity   Physical Exam  Triage Vital Signs: ED Triage Vitals  Encounter Vitals Group     BP 08/25/23 1717 138/63     Systolic BP Percentile --      Diastolic BP Percentile --      Pulse Rate 08/25/23 1717 73     Resp 08/25/23 1717 18     Temp 08/25/23 1717 97.7 F (36.5 C)     Temp Source 08/25/23 1717 Oral     SpO2 08/25/23 1717 98 %     Weight 08/25/23 1718 170 lb (77.1 kg)     Height 08/25/23 1718 5\' 7"  (1.702 m)     Head Circumference --      Peak Flow --      Pain Score 08/25/23 1718 0     Pain Loc --      Pain Education --      Exclude from Growth Chart --    Most recent vital signs: Vitals:   08/25/23 1717  BP: 138/63  Pulse: 73  Resp: 18  Temp: 97.7 F (36.5 C)  SpO2: 98%   General: Awake, oriented x4. CV:  Good peripheral perfusion.  Resp:  Normal effort.  Abd:  No distention.  Other:  Elderly overweight Caucasian male resting comfortably in no acute distress ED Results / Procedures / Treatments  Labs (all labs ordered are listed, but only abnormal results are displayed) Labs Reviewed  COMPREHENSIVE METABOLIC PANEL WITH GFR - Abnormal; Notable for the following components:      Result Value   Potassium 5.3 (*)    Glucose, Bld 52 (*)    BUN 52 (*)    Creatinine, Ser  1.58 (*)    Calcium 10.4 (*)    Total Protein 8.4 (*)    GFR, Estimated 43 (*)    All other components within normal limits  URINALYSIS, ROUTINE W REFLEX MICROSCOPIC - Abnormal; Notable for the following components:   Color, Urine YELLOW (*)    APPearance CLEAR (*)    All other components within normal limits  CBC WITH DIFFERENTIAL/PLATELET   EKG ED ECG REPORT I, Merwyn Katos, the attending physician, personally viewed and interpreted this ECG. Date: 08/25/2023 EKG Time: 1721 Rate: 64 Rhythm: AV dual paced rhythm QRS Axis: normal Intervals: normal ST/T Wave abnormalities: normal Narrative Interpretation: AV dual paced rhythm.  No evidence of acute ischemia  PROCEDURES: Critical Care performed: No Procedures MEDICATIONS ORDERED IN ED: Medications  sodium zirconium cyclosilicate (LOKELMA) packet 5 g (has no administration in time range)   IMPRESSION / MDM / ASSESSMENT AND PLAN / ED COURSE  I reviewed the triage vital signs and the nursing notes.                             The patient  is on the cardiac monitor to evaluate for evidence of arrhythmia and/or significant heart rate changes. Patient's presentation is most consistent with acute presentation with potential threat to life or bodily function. Patient is a 83 year old male with the above-stated past medical history that presents for incidentally found potassium of 6.3 on outpatient labs Workup: CBC, BMP, ECG Findings: ECG: shows no concerning T wave morphology, interval prolongation or other findings concerning for severe potassium related changes. Repeat BMP here shows potassium of 5.3.  Patient does have slightly worsening of his creatinine to 1.58 up from 1.04  Not secondary to crush or thermal injury. Doubt TLS or DKA. Given patient's repeat potassium is very slightly over normal, may benefit from a 5 mg dose of Lokelma with instructions to follow-up with his nephrologist Dispo: Discharge home with nephrology  follow-up   FINAL CLINICAL IMPRESSION(S) / ED DIAGNOSES   Final diagnoses:  Hyperkalemia   Rx / DC Orders   ED Discharge Orders     None      Note:  This document was prepared using Dragon voice recognition software and may include unintentional dictation errors.   Merwyn Katos, MD 08/25/23 2219

## 2023-08-31 ENCOUNTER — Other Ambulatory Visit: Payer: Self-pay | Admitting: Physician Assistant

## 2023-08-31 NOTE — Telephone Encounter (Signed)
 Prescription refill request for Eliquis received. Indication:sss Last office visit:3/25 Scr:1.58  4/25 Age: 83 Weight:77.1  kg  Prescription refilled

## 2023-09-07 ENCOUNTER — Encounter: Admitting: Internal Medicine

## 2023-09-07 DIAGNOSIS — Z006 Encounter for examination for normal comparison and control in clinical research program: Secondary | ICD-10-CM

## 2023-09-07 DIAGNOSIS — J849 Interstitial pulmonary disease, unspecified: Secondary | ICD-10-CM

## 2023-09-07 NOTE — Research (Signed)
 Title: Chronic Fibrosing Interstitial Lung Disease with Progressive Phenotype Prospective Outcomes (ILD-PRO) Registry    Protocol #: IPF-PRO-SUB, Clinical Trials # [id number], Sponsor: [employer]   Protocol Version Amendment 6 dated 30Apr2024  and confirmed current on 10Sep2024 Consent Version for today's visit date of  Is [facility] IRB Approved Version 22May2024 Revised 29Jul2024   Objectives:  Describe current approaches to diagnosis and treatment of chronic fibrosing ILDs with progressive phenotype  Describe the natural history of chronic fibrosing ILDs with progressive phenotype  Assess quality of life from self-administered participant reported questionnaires for each disease group  Describe participant interactions with the healthcare system, describe treatment practices across multiple institutions for each disease group  Collect biological samples linked to well characterized chronic fibrosing ILDs with progressive phenotype to identify disease biomarkers  Collect data and biological samples that will support future research studies.                                            Key Inclusion Criteria: Willing and able to provide informed consent  Age >= 30 years  Diagnosis of a non-IPF ILD of any duration, including, but not limited to Idiopathic Non-Specific Interstitial Pneumonia (INSIP), Unclassifiable Idiopathic Interstitial Pneumonias (IIPs), Interstitial Pneumonia with Autoimmune Features (IPAF), Autoimmune ILDs such as Rheumatoid Arthritis (RA-ILD) and Systemic Sclerosis (SSC-ILD), Chronic Hypersensitivity Pneumonitis (HP), Sarcoidosis or Exposure-related ILDs such as asbestosis.  Chronic fibrosing ILD defined by reticular abnormality with traction bronchiectasis with or without honeycombing confirmed by chest HRCT scan and/or lung biopsy.  Progressive phenotype as defined by fulfilling at least one of the criteria below of fibrotic changes (progression set  point) within the last 24 months regardless of treatment considered appropriate in individual ILDs:  decline in FVC % predicted (% pred) based on >10% relative decline  decline in FVC % pred based on >= 5 - <10% relative decline in FVC combined with worsening of respiratory symptoms as assessed by the site investigator  decline in FVC % pred based on >= 5 - <10% relative decline in FVC combined with increasing extent of fibrotic changes on chest imaging (HRCT scan) as assessed by the site investigator  decline in DLCO % pred based on >= 10% relative decline  worsening of respiratory symptoms as well as increasing extent of fibrotic changes on chest imaging (HRCT scan) as assessed by the site investigator independent of FVC change.     Key Exclusion Criteria: Malignancy, treated or untreated, other than skin or early stage prostate cancer, within the past 5 years  Currently listed for lung transplantation at the time of enrollment  Currently enrolled in a clinical trial at the time of enrollment in this registry    Clinical Research Coordinator / Research RN note : This visit for  Subject [id number] with DOB: 02/26/1941 on 14Apr2025 for the above protocol is Visit/Encounter 8 and is for purpose of research.    Subject expressed continued interest and consent in continuing as a study subject. Subject confirmed that there was no change in contact information (e.g. address, telephone, email). Subject thanked for participation in research and contribution to science.     During this visit on, the subject completed the blood work and questionnaires per the above referenced protocol. Please refer to the subject's paper source binder for further details.   Signed by Despina Hick  Clinical Research Coordinator [facility]  [place], [place]

## 2023-09-17 NOTE — Progress Notes (Signed)
 Remote pacemaker transmission.

## 2023-09-25 ENCOUNTER — Other Ambulatory Visit: Payer: Self-pay | Admitting: *Deleted

## 2023-09-25 DIAGNOSIS — D509 Iron deficiency anemia, unspecified: Secondary | ICD-10-CM

## 2023-09-28 ENCOUNTER — Inpatient Hospital Stay: Payer: Self-pay | Attending: Oncology

## 2023-09-28 DIAGNOSIS — D509 Iron deficiency anemia, unspecified: Secondary | ICD-10-CM | POA: Diagnosis present

## 2023-09-28 LAB — CBC WITH DIFFERENTIAL/PLATELET
Abs Immature Granulocytes: 0.02 10*3/uL (ref 0.00–0.07)
Basophils Absolute: 0.1 10*3/uL (ref 0.0–0.1)
Basophils Relative: 1 %
Eosinophils Absolute: 0.2 10*3/uL (ref 0.0–0.5)
Eosinophils Relative: 2 %
HCT: 36.3 % — ABNORMAL LOW (ref 39.0–52.0)
Hemoglobin: 11.8 g/dL — ABNORMAL LOW (ref 13.0–17.0)
Immature Granulocytes: 0 %
Lymphocytes Relative: 23 %
Lymphs Abs: 1.8 10*3/uL (ref 0.7–4.0)
MCH: 32 pg (ref 26.0–34.0)
MCHC: 32.5 g/dL (ref 30.0–36.0)
MCV: 98.4 fL (ref 80.0–100.0)
Monocytes Absolute: 0.7 10*3/uL (ref 0.1–1.0)
Monocytes Relative: 9 %
Neutro Abs: 5.1 10*3/uL (ref 1.7–7.7)
Neutrophils Relative %: 65 %
Platelets: 183 10*3/uL (ref 150–400)
RBC: 3.69 MIL/uL — ABNORMAL LOW (ref 4.22–5.81)
RDW: 14.5 % (ref 11.5–15.5)
WBC: 7.9 10*3/uL (ref 4.0–10.5)
nRBC: 0 % (ref 0.0–0.2)

## 2023-09-28 LAB — IRON AND TIBC
Iron: 80 ug/dL (ref 45–182)
Saturation Ratios: 23 % (ref 17.9–39.5)
TIBC: 343 ug/dL (ref 250–450)
UIBC: 263 ug/dL

## 2023-09-28 LAB — FERRITIN: Ferritin: 492 ng/mL — ABNORMAL HIGH (ref 24–336)

## 2023-09-29 ENCOUNTER — Inpatient Hospital Stay (HOSPITAL_BASED_OUTPATIENT_CLINIC_OR_DEPARTMENT_OTHER): Payer: Self-pay | Admitting: Oncology

## 2023-09-29 ENCOUNTER — Encounter: Payer: Self-pay | Admitting: Oncology

## 2023-09-29 ENCOUNTER — Inpatient Hospital Stay: Payer: Self-pay

## 2023-09-29 VITALS — BP 153/76 | Temp 97.1°F | Resp 17 | Wt 184.0 lb

## 2023-09-29 DIAGNOSIS — D509 Iron deficiency anemia, unspecified: Secondary | ICD-10-CM | POA: Diagnosis not present
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
## 2023-09-30 ENCOUNTER — Other Ambulatory Visit: Payer: Medicare Other

## 2023-10-01 ENCOUNTER — Ambulatory Visit: Payer: Medicare Other

## 2023-10-01 ENCOUNTER — Ambulatory Visit: Payer: Medicare Other | Admitting: Oncology

## 2023-10-15 ENCOUNTER — Other Ambulatory Visit: Payer: Self-pay | Admitting: Cardiovascular Disease

## 2023-11-05 ENCOUNTER — Ambulatory Visit (INDEPENDENT_AMBULATORY_CARE_PROVIDER_SITE_OTHER): Payer: Medicare Other

## 2023-11-05 DIAGNOSIS — I495 Sick sinus syndrome: Secondary | ICD-10-CM | POA: Diagnosis not present

## 2023-11-05 LAB — CUP PACEART REMOTE DEVICE CHECK
Battery Remaining Longevity: 72 mo
Battery Remaining Percentage: 63 %
Battery Voltage: 3.01 V
Brady Statistic AP VP Percent: 92 %
Brady Statistic AP VS Percent: 1 %
Brady Statistic AS VP Percent: 7.5 %
Brady Statistic AS VS Percent: 1 %
Brady Statistic RA Percent Paced: 92 %
Brady Statistic RV Percent Paced: 99 %
Date Time Interrogation Session: 20250612020013
Implantable Lead Connection Status: 753985
Implantable Lead Connection Status: 753985
Implantable Lead Implant Date: 20130716
Implantable Lead Implant Date: 20220317
Implantable Lead Location: 753859
Implantable Lead Location: 753860
Implantable Lead Model: 1948
Implantable Lead Model: 5076
Implantable Pulse Generator Implant Date: 20220317
Lead Channel Impedance Value: 340 Ohm
Lead Channel Impedance Value: 530 Ohm
Lead Channel Pacing Threshold Amplitude: 0.5 V
Lead Channel Pacing Threshold Amplitude: 0.75 V
Lead Channel Pacing Threshold Pulse Width: 0.5 ms
Lead Channel Pacing Threshold Pulse Width: 0.5 ms
Lead Channel Sensing Intrinsic Amplitude: 12 mV
Lead Channel Sensing Intrinsic Amplitude: 2.1 mV
Lead Channel Setting Pacing Amplitude: 1 V
Lead Channel Setting Pacing Amplitude: 1.5 V
Lead Channel Setting Pacing Pulse Width: 0.5 ms
Lead Channel Setting Sensing Sensitivity: 7 mV
Pulse Gen Model: 2272
Pulse Gen Serial Number: 3908614

## 2023-11-05 NOTE — Progress Notes (Addendum)
 Cardiology Office Note   Date:  11/09/2023  ID:  Cameron [name], DOB 08/24/1940, MRN [id number] PCP: [name], MD  [facility] Providers Cardiologist:  Debby Sor, MD Cardiology APP:  Madie Jon Garre, PA  Electrophysiologist:  OLE ONEIDA HOLTS, MD  Sleep Medicine:  Wilbert Bihari, MD     History of Present Illness Cameron [name] is a 83 y.o. male with a past medical history of CAD, HTN, complete heart block s/p PPM, atrial fibrillation, pulmonary hypertension, ILD, OSA on CPAP, DM2, hypothyroidism, psoriatic arthritis, rheumatoid arthritis, CKD stage III, dyslipidemia.  11/05/2023 device check 1 high atrial rate lasting approximately 3 hours 07/28/2023 echo EF 60-65%, grade 3 DD, mild MR, moderate TR, aortic sclerosis without stenosis 02/24/2023 Lexiscan  low risk, EF 46% 05/29/2021 right heart cath mild pulmonary hypertension 08/09/2020 ppm generator change out with lead revision 08/12/2019 left heart cath PCI with DES to his left circumflex and OM1 11/12/2017 carotid duplex minimal to moderate amount of bilateral plaque  [facility] of [facility], initially established with Dr. Kelsie for management of his complete heart block and initial pacemaker implantation in 2013.  In 2021 he underwent a left heart cath with PCI with DES to his left circumflex and OM1.  He established with Dr. Sor around this time for his general cardiology needs.  Most recently evaluated by Dr. Sor, stable regarding his CAD, blood pressure was well-controlled, he was bothered by shortness of breath which had been persistent, follows with Dr. Geronimo with pulmonology, no changes were made to his plan of care and he was advised to follow-up in 6 months.  He presents today with concerns of profound fatigue. He is able to complete all of his normal activities (walking for exercise etc.) however, he is just not able to complete this without feeling completely drained. He denies chest pain,  palpitations, dyspnea, pnd, orthopnea, n, v, dizziness, syncope, edema, weight gain, or early satiety.    ROS: Review of Systems  Constitutional:  Positive for malaise/fatigue.     Studies Reviewed EKG Interpretation Date/Time:  Friday November 06 2023 11:30:51 EDT Ventricular Rate:  60 PR Interval:  176 QRS Duration:  172 QT Interval:  456 QTC Calculation: 456 R Axis:   -89  Text Interpretation: AV dual-paced rhythm Abnormal ECG When compared with ECG of 25-Aug-2023 17:21, Vent. rate has decreased BY   4 BPM Confirmed by Carlin Nest [phone] on 11/06/2023 11:34:18 AM   Cardiac Studies & Procedures   ______________________________________________________________________________________________ CARDIAC CATHETERIZATION  CARDIAC CATHETERIZATION 05/29/2021  Conclusion Mild pulmonary hypertension in this patient with interstitial lung disease with elevated RV 54/12; PA pressure 55/18; mean 31 mmHg.  PVR 2.6 WU.  PW mean pressure is 17 mmHg, with V wave 24 c/w his echo documented mild - moderate mitral regurgitation.  The patient will resume his home medications commencing tomorrow including resumption of Xarelto .   CARDIAC CATHETERIZATION  CARDIAC CATHETERIZATION 08/11/2017  Conclusion Difficult but successful bifurcation stenting of a dominant left circumflex vessel with ultimate insertion of a 2.515 mm  Resolute DES stent into the OM1 one-vessel from its ostium and a 3.018 mm Resolute DES stent inserted extending from the proximal circumflex into the mid AV groove circumflex beyond the OM1 vessel.  The 80% circumflex marginal stenosis was reduced to 0% in the 90% AV groove circumflex stenosis was reduced to 0%.  RECOMMENDATION: DAPT therapy for minimum of 1 year.  Medical therapy for concomitant CAD as noted in Dr. Orvilla report.  Findings  Coronary Findings Diagnostic  Dominance: Right  No diagnostic findings have been documented. Intervention  No interventions have been  documented.   STRESS TESTS  MYOCARDIAL PERFUSION IMAGING 02/24/2023  Narrative   Findings are equivocal. The study is low risk.   No ST deviation was noted.   Left ventricular function is abnormal. Global function is mildly reduced. Nuclear stress EF: 46%. The left ventricular ejection fraction is mildly decreased (45-54%). End diastolic cavity size is normal.   Prior study available for comparison from 05/22/2017.  Small fixed apical defect can be scar vs. Apical thinning; inferior defect that is worse with rest, suspicious for diaphragmatic attenuation. Wall motion in the septum can reflect V pacing.   ECHOCARDIOGRAM  ECHOCARDIOGRAM COMPLETE 07/28/2023  Narrative ECHOCARDIOGRAM REPORT    Patient Name:   Cameron [name] Date of Exam: 07/28/2023 Medical Rec #:  [id number]       Height:       67.0 in Accession #:    [id number]      Weight:       182.5 lb Date of Birth:  12/11/1940       BSA:          1.945 m Patient Age:    82 years        BP:           140/54 mmHg Patient Gender: M               HR:           63 bpm. Exam Location:  Outpatient  Procedure: 2D Echo, Cardiac Doppler, Color Doppler and Strain Analysis (Both Spectral and Color Flow Doppler were utilized during procedure).  Indications:    R06.9 DOE; R60.0 Lower extremity edema  History:        Patient has prior history of Echocardiogram examinations, most recent 09/17/2022. CHF, CAD, Pacemaker, Pulmonary HTN, Arrythmias:Complete Heart Block and Atrial Fibrillation, Signs/Symptoms:Dyspnea and Edema; Risk Factors:Diabetes, Sleep Apnea, Hypertension, Dyslipidemia, Former Smoker and CKD stage 3b. Patient denies chest pain. He does have DOE and right leg edema.  Sonographer:    Annabella Cater RVT, RDCS (AE), RDMS Referring Phys: 66 THOMAS A KELLY   Sonographer Comments: Global longitudinal strain was attempted. IMPRESSIONS   1. Left ventricular ejection fraction, by estimation, is 60 to 65%. Left ventricular  ejection fraction by 3D volume is 64 %. The left ventricle has normal function. The left ventricle has no regional wall motion abnormalities. Left ventricular diastolic parameters are consistent with Grade III diastolic dysfunction (restrictive). Elevated left atrial pressure. The E/e' is 22. The average left ventricular global longitudinal strain is -13.0 %. The global longitudinal strain is abnormal. 2. Right ventricular systolic function is low normal. The right ventricular size is mildly enlarged. There is severely elevated pulmonary artery systolic pressure. The estimated right ventricular systolic pressure is 68.5 mmHg. 3. Left atrial size was severely dilated. 4. The mitral valve is degenerative. Mild mitral valve regurgitation. No evidence of mitral stenosis. 5. Tricuspid valve regurgitation is moderate. 6. The aortic valve is tricuspid. Aortic valve regurgitation is mild. Aortic valve sclerosis is present, with no evidence of aortic valve stenosis. 7. The inferior vena cava is dilated in size with >50% respiratory variability, suggesting right atrial pressure of 8 mmHg.  Comparison(s): EF 45%, global hypokinesis, mild AI.  FINDINGS Left Ventricle: Left ventricular ejection fraction, by estimation, is 60 to 65%. Left ventricular ejection fraction by 3D volume is 64 %. The left ventricle has normal  function. The left ventricle has no regional wall motion abnormalities. The average left ventricular global longitudinal strain is -13.0 %. Strain was performed and the global longitudinal strain is abnormal. The left ventricular internal cavity size was normal in size. There is no left ventricular hypertrophy. Left ventricular diastolic parameters are consistent with Grade III diastolic dysfunction (restrictive). Elevated left atrial pressure. The E/e' is 22.  Right Ventricle: The right ventricular size is mildly enlarged. Right vetricular wall thickness was not well visualized. Right ventricular  systolic function is low normal. There is severely elevated pulmonary artery systolic pressure. The tricuspid regurgitant velocity is 3.89 m/s, and with an assumed right atrial pressure of 8 mmHg, the estimated right ventricular systolic pressure is 68.5 mmHg.  Left Atrium: Left atrial size was severely dilated.  Right Atrium: Right atrial size was normal in size.  Pericardium: There is no evidence of pericardial effusion.  Mitral Valve: The mitral valve is degenerative in appearance. Mild mitral annular calcification. Mild mitral valve regurgitation. No evidence of mitral valve stenosis.  Tricuspid Valve: The tricuspid valve is grossly normal. Tricuspid valve regurgitation is moderate . No evidence of tricuspid stenosis.  Aortic Valve: The aortic valve is tricuspid. Aortic valve regurgitation is mild. Aortic valve sclerosis is present, with no evidence of aortic valve stenosis. Aortic valve mean gradient measures 5.0 mmHg. Aortic valve peak gradient measures 10.5 mmHg. Aortic valve area, by VTI measures 1.91 cm.  Pulmonic Valve: The pulmonic valve was normal in structure. Pulmonic valve regurgitation is mild. No evidence of pulmonic stenosis.  Aorta: The aortic root and ascending aorta are structurally normal, with no evidence of dilitation.  Venous: The inferior vena cava is dilated in size with greater than 50% respiratory variability, suggesting right atrial pressure of 8 mmHg.  IAS/Shunts: The atrial septum is grossly normal.  Additional Comments: 3D was performed not requiring image post processing on an independent workstation and was normal. A device lead is visualized.   LEFT VENTRICLE PLAX 2D LVIDd:         4.49 cm         Diastology LVIDs:         2.86 cm         LV e' medial:    4.67 cm/s LV PW:         0.94 cm         LV E/e' medial:  29.8 LV IVS:        0.88 cm         LV e' lateral:   11.10 cm/s LVOT diam:     1.83 cm         LV E/e' lateral: 12.5 LV SV:          62 LV SV Index:   32              2D Longitudinal LVOT Area:     2.63 cm        Strain 2D Strain GLS   -13.0 % Avg:  3D Volume EF LV 3D EF:    Left ventricul ar ejection fraction by 3D volume is 64 %.  3D Volume EF: 3D EF:        64 % LV EDV:       117 ml LV ESV:       43 ml LV SV:        74 ml  RIGHT VENTRICLE RV S prime:     9.23 cm/s TAPSE (M-mode): 1.9 cm  LEFT ATRIUM              Index        RIGHT ATRIUM           Index LA diam:        5.78 cm  2.97 cm/m   RA Area:     18.00 cm LA Vol (A2C):   107.0 ml 55.01 ml/m  RA Volume:   51.90 ml  26.68 ml/m LA Vol (A4C):   94.1 ml  48.35 ml/m LA Biplane Vol: 97.8 ml  50.28 ml/m AORTIC VALVE                     PULMONIC VALVE AV Area (Vmax):    1.74 cm      PV Vmax:       0.97 m/s AV Area (Vmean):   1.76 cm      PV Peak grad:  3.8 mmHg AV Area (VTI):     1.91 cm AV Vmax:           162.00 cm/s AV Vmean:          106.000 cm/s AV VTI:            0.322 m AV Peak Grad:      10.5 mmHg AV Mean Grad:      5.0 mmHg LVOT Vmax:         107.00 cm/s LVOT Vmean:        70.800 cm/s LVOT VTI:          0.234 m LVOT/AV VTI ratio: 0.73 AR Vena Contracta: 0.44 cm  AORTA Ao Root diam: 3.28 cm Ao Asc diam:  3.35 cm Ao Arch diam: 2.7 cm  MITRAL VALVE                  TRICUSPID VALVE MV Area (PHT): 4.89 cm       TR Peak grad:   60.5 mmHg MV Decel Time: 155 msec       TR Vmax:        389.00 cm/s MR Peak grad:    102.8 mmHg MR Vmax:         507.00 cm/s  SHUNTS MR PISA Nyquist: 2.3 m/s      Systemic VTI:  0.23 m MR PISA:         3.58 cm     Systemic Diam: 1.83 cm MR PISA Radius:  0.76 cm MV E velocity: 139.00 cm/s MV A velocity: 64.50 cm/s MV E/A ratio:  2.16  Sunit Tolia Electronically signed by [employer] Signature Date/Time: 07/28/2023/5:32:14 PM    Final          ______________________________________________________________________________________________      Risk Assessment/Calculations  CHA2DS2-VASc  Score = 5   This indicates a 7.2% annual risk of stroke. The patient's score is based upon: CHF History: 1 HTN History: 0 Diabetes History: 1 Stroke History: 0 Vascular Disease History: 1 Age Score: 2 Gender Score: 0         Physical Exam VS:  BP 130/70   Pulse 60   Ht 5' 7 (1.702 m)   Wt 188 lb (85.3 kg)   SpO2 95%   BMI 29.44 kg/m    Wt Readings from Last 3 Encounters:  11/06/23 188 lb (85.3 kg)  09/29/23 184 lb (83.5 kg)  08/25/23 170 lb (77.1 kg)    GEN: Well nourished, well developed in no acute distress NECK: No JVD; No carotid bruits CARDIAC:  RRR, no murmurs, rubs, gallops RESPIRATORY:  Clear to auscultation without rales, wheezing or rhonchi  ABDOMEN: Soft, non-tender, non-distended EXTREMITIES:  No edema; No deformity   ASSESSMENT AND PLAN PAF/hypercoagulable state- CHADS vasc score of 5, continue Eliquis  5 mg BID, cardizem  120 mg daily, bystolic  5 mg daily.  CAD - s/p PCI with DES to Lcx and OM1 in 2021 >> Lexiscan  2024 normal.  CHB/PPM - follow with Dr. Cindie, recent device check AV paced, EKG today reveals same.   Hypertension - BP today is controlled at 130/70, continue telmisartan  and bystolic .   Fatigue/history of IDA - does not sound to be a component of his CAD, will check labs including TSH, vitamin D , B12, CBC, iron  and ferritin, BMET.   Claudication-will arrange for ABIs.        Dispo: labs per above, plans to establish with Dr. Ladona for further general cardiology needs.   Signed, Delon JAYSON Hoover, NP

## 2023-11-06 ENCOUNTER — Ambulatory Visit: Attending: Cardiology | Admitting: Cardiology

## 2023-11-06 VITALS — BP 130/70 | HR 60 | Ht 67.0 in | Wt 188.0 lb

## 2023-11-06 DIAGNOSIS — I442 Atrioventricular block, complete: Secondary | ICD-10-CM | POA: Diagnosis not present

## 2023-11-06 DIAGNOSIS — I10 Essential (primary) hypertension: Secondary | ICD-10-CM | POA: Diagnosis not present

## 2023-11-06 DIAGNOSIS — Z950 Presence of cardiac pacemaker: Secondary | ICD-10-CM | POA: Diagnosis not present

## 2023-11-06 DIAGNOSIS — D649 Anemia, unspecified: Secondary | ICD-10-CM

## 2023-11-06 DIAGNOSIS — M545 Low back pain, unspecified: Secondary | ICD-10-CM | POA: Insufficient documentation

## 2023-11-06 DIAGNOSIS — H905 Unspecified sensorineural hearing loss: Secondary | ICD-10-CM | POA: Insufficient documentation

## 2023-11-06 DIAGNOSIS — R202 Paresthesia of skin: Secondary | ICD-10-CM

## 2023-11-06 DIAGNOSIS — I2510 Atherosclerotic heart disease of native coronary artery without angina pectoris: Secondary | ICD-10-CM | POA: Diagnosis not present

## 2023-11-06 DIAGNOSIS — M67431 Ganglion, right wrist: Secondary | ICD-10-CM | POA: Insufficient documentation

## 2023-11-06 DIAGNOSIS — F32A Depression, unspecified: Secondary | ICD-10-CM | POA: Insufficient documentation

## 2023-11-06 DIAGNOSIS — Z0271 Encounter for disability determination: Secondary | ICD-10-CM | POA: Insufficient documentation

## 2023-11-06 DIAGNOSIS — Z7729 Contact with and (suspected ) exposure to other hazardous substances: Secondary | ICD-10-CM | POA: Insufficient documentation

## 2023-11-06 DIAGNOSIS — I739 Peripheral vascular disease, unspecified: Secondary | ICD-10-CM

## 2023-11-06 DIAGNOSIS — F338 Other recurrent depressive disorders: Secondary | ICD-10-CM | POA: Insufficient documentation

## 2023-11-06 DIAGNOSIS — M109 Gout, unspecified: Secondary | ICD-10-CM | POA: Insufficient documentation

## 2023-11-06 DIAGNOSIS — R6889 Other general symptoms and signs: Secondary | ICD-10-CM | POA: Insufficient documentation

## 2023-11-06 NOTE — Patient Instructions (Signed)
 Medication Instructions:   No changes   *If you need a refill on your cardiac medications before your next appointment, please call your pharmacy*  Lab Work:  CBC w/ diff Iron  and folate CMET Vitamin D  Vitamin B12 Thyroid  panel  If you have labs (blood work) drawn today and your tests are completely normal, you will receive your results only by: MyChart Message (if you have MyChart) OR A paper copy in the mail If you have any lab test that is abnormal or we need to change your treatment, we will call you to review the results.  Testing/Procedures: Your physician has requested that you have an ankle brachial index (ABI). During this test an ultrasound and blood pressure cuff are used to evaluate the arteries that supply the arms and legs with blood. Allow thirty minutes for this exam. There are no restrictions or special instructions.  Please note: We ask at that you not bring children with you during ultrasound (echo/ vascular) testing. Due to room size and safety concerns, children are not allowed in the ultrasound rooms during exams. Our front office staff cannot provide observation of children in our lobby area while testing is being conducted. An adult accompanying a patient to their appointment will only be allowed in the ultrasound room at the discretion of the ultrasound technician under special circumstances. We apologize for any inconvenience.   Follow-Up: At [facility], you and your health needs are our priority.  As part of our continuing mission to provide you with exceptional heart care, our providers are all part of one team.  This team includes your primary Cardiologist (physician) and Advanced Practice Providers or APPs (Physician Assistants and Nurse Practitioners) who all work together to provide you with the care you need, when you need it.  Your next appointment:   Keep follow up with Dr. Berry Bristol    We recommend signing up for the patient portal called  MyChart.  Sign up information is provided on this After Visit Summary.  MyChart is used to connect with patients for Virtual Visits (Telemedicine).  Patients are able to view lab/test results, encounter notes, upcoming appointments, etc.  Non-urgent messages can be sent to your provider as well.   To learn more about what you can do with MyChart, go to [email or web address].   Other Instructions

## 2023-11-07 ENCOUNTER — Ambulatory Visit: Payer: Self-pay | Admitting: Cardiology

## 2023-11-07 LAB — COMPREHENSIVE METABOLIC PANEL WITH GFR
ALT: 23 IU/L (ref 0–44)
AST: 25 IU/L (ref 0–40)
Albumin: 4.5 g/dL (ref 3.7–4.7)
Alkaline Phosphatase: 125 IU/L — ABNORMAL HIGH (ref 44–121)
BUN/Creatinine Ratio: 26 — ABNORMAL HIGH (ref 10–24)
BUN: 27 mg/dL (ref 8–27)
Bilirubin Total: 0.4 mg/dL (ref 0.0–1.2)
CO2: 21 mmol/L (ref 20–29)
Calcium: 10.1 mg/dL (ref 8.6–10.2)
Chloride: 104 mmol/L (ref 96–106)
Creatinine, Ser: 1.05 mg/dL (ref 0.76–1.27)
Globulin, Total: 2.5 g/dL (ref 1.5–4.5)
Glucose: 125 mg/dL — ABNORMAL HIGH (ref 70–99)
Potassium: 5.2 mmol/L (ref 3.5–5.2)
Sodium: 141 mmol/L (ref 134–144)
Total Protein: 7 g/dL (ref 6.0–8.5)
eGFR: 70 mL/min/1.73

## 2023-11-07 LAB — CBC WITH DIFFERENTIAL/PLATELET
Basophils Absolute: 0.1 x10E3/uL (ref 0.0–0.2)
Basos: 1 %
EOS (ABSOLUTE): 0.3 x10E3/uL (ref 0.0–0.4)
Eos: 3 %
Hematocrit: 40.8 % (ref 37.5–51.0)
Hemoglobin: 12.8 g/dL — ABNORMAL LOW (ref 13.0–17.7)
Immature Grans (Abs): 0 x10E3/uL (ref 0.0–0.1)
Immature Granulocytes: 0 %
Lymphocytes Absolute: 2.2 x10E3/uL (ref 0.7–3.1)
Lymphs: 27 %
MCH: 31.3 pg (ref 26.6–33.0)
MCHC: 31.4 g/dL — ABNORMAL LOW (ref 31.5–35.7)
MCV: 100 fL — ABNORMAL HIGH (ref 79–97)
Monocytes Absolute: 0.7 x10E3/uL (ref 0.1–0.9)
Monocytes: 9 %
Neutrophils Absolute: 4.9 x10E3/uL (ref 1.4–7.0)
Neutrophils: 60 %
Platelets: 190 x10E3/uL (ref 150–450)
RBC: 4.09 x10E6/uL — ABNORMAL LOW (ref 4.14–5.80)
RDW: 13 % (ref 11.6–15.4)
WBC: 8.1 x10E3/uL (ref 3.4–10.8)

## 2023-11-07 LAB — VITAMIN B12: Vitamin B-12: 496 pg/mL (ref 232–1245)

## 2023-11-07 LAB — IRON,TIBC AND FERRITIN PANEL
Ferritin: 696 ng/mL — ABNORMAL HIGH (ref 30–400)
Iron Saturation: 30 % (ref 15–55)
Iron: 95 ug/dL (ref 38–169)
Total Iron Binding Capacity: 319 ug/dL (ref 250–450)
UIBC: 224 ug/dL (ref 111–343)

## 2023-11-07 LAB — VITAMIN D 25 HYDROXY (VIT D DEFICIENCY, FRACTURES): Vit D, 25-Hydroxy: 53.3 ng/mL (ref 30.0–100.0)

## 2023-11-07 LAB — TSH+T4F+T3FREE
Free T4: 0.67 ng/dL — ABNORMAL LOW (ref 0.82–1.77)
T3, Free: 3.9 pg/mL (ref 2.0–4.4)
TSH: 8.94 u[IU]/mL — ABNORMAL HIGH (ref 0.450–4.500)

## 2023-11-09 ENCOUNTER — Ambulatory Visit: Payer: Self-pay | Admitting: Cardiology

## 2023-11-09 DIAGNOSIS — I739 Peripheral vascular disease, unspecified: Secondary | ICD-10-CM

## 2023-11-09 DIAGNOSIS — R6889 Other general symptoms and signs: Secondary | ICD-10-CM

## 2023-11-30 ENCOUNTER — Ambulatory Visit: Attending: Cardiology

## 2023-11-30 DIAGNOSIS — R202 Paresthesia of skin: Secondary | ICD-10-CM

## 2023-11-30 DIAGNOSIS — I10 Essential (primary) hypertension: Secondary | ICD-10-CM

## 2023-11-30 DIAGNOSIS — I442 Atrioventricular block, complete: Secondary | ICD-10-CM

## 2023-11-30 DIAGNOSIS — I2510 Atherosclerotic heart disease of native coronary artery without angina pectoris: Secondary | ICD-10-CM

## 2023-11-30 DIAGNOSIS — Z950 Presence of cardiac pacemaker: Secondary | ICD-10-CM

## 2023-11-30 DIAGNOSIS — D649 Anemia, unspecified: Secondary | ICD-10-CM

## 2023-12-01 ENCOUNTER — Ambulatory Visit: Payer: Medicare Other | Admitting: Dermatology

## 2023-12-02 LAB — VAS US ABI WITH/WO TBI: Right ABI: 1.5

## 2023-12-02 NOTE — Progress Notes (Unsigned)
 PCP [name], MD  HPI   IOV 07/10/2017  Chief Complaint  Patient presents with   Advice Only    Referred by [facility] due to SOB.  PFT done 05/28/17.  Pt has been having issues with SOB x4 months especially with exertion and has some mild chest tightness. Denies any cough.    83 year old male referred by Dr. Kelsie for evaluation of shortness of breath after cardiac etiologies ruled out.  He tells me that he is a remote smoker.  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  He has a long-standing history of rheumatoid arthritis followed by Dr. Maryl in [place].  He is to be on methotrexate for many years and stopped taking it because of cardiac dysfunction [he personally is convinced that methotrexate because this].  He was then on leflunomide  as of 2017 but is currently not on it.  His last rheumatoid factor and CCP antibodies were negative on my personal chart review of the outside records in 2016.    Now for the last 3 or 6 months he is got insidious onset of shortness of breath that is slowly progressive.  His dyspnea on exertion relieved by rest.  Class II-3 activities.  There I  s no associated cough or orthopnea proximal nocturnal dyspnea.  He did have some edema but this got cleared up but the dyspnea is continuing to get worse.  In the last few months he has had a cardiac echo that showed pulmonary hypertension.  Did have cardiac stress test that is normal.  Had pulmonary function test that showed isolated reduction in diffusion capacity and therefore he has been referred here.  Walking desaturation test on 07/10/2017 185 feet x 3 laps on ROOM AIR:  did NOT desaturate. Rest pulse ox was 100%, final pulse ox was 98%. HR response 60/min at rest to 121/min at peak exertion. Patient Cameron [name]  Did not Desaturate < 88% . Cameron [name] did not  Desaturated  </= 3% points. Cameron DELENA Poznanski yes did get tachyardic   OV 08/04/2017  Chief Complaint  Patient presents with   Follow-up    ILD    Follow-up interstitial lung disease workup  After the last visit no interim problems.  He has some chronic pedal edema that he will talk to about with his primary care physician.  He did see Dr. Maryl rheumatologist in July 15, 2017.  I reviewed his notes.  It is not specifically indicate patient has nonspecific seronegative arthritis with a differential diagnosis of seronegative rheumatoid arthritis versus psoriatic.  Patient still seems to think the root of all his problems is the methotrexate he took for over 10 years.  At this point in time there is no decompensation.  As part of the ILD workup his pulmonary function test shows mild reduction in diffusion capacity.  Correlating with this is evidence of pulmonary hypertension on the echo and high-resolution CT chest enlarged pulmonary arteries.  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  His autoimmune panel is negative.  And so the vasculitis panel and hypersensitivity pneumonitis panel.    IMPRESSION: 1. Mild subpleural reticular densities in the posterolateral aspects of both lower lobes are suspicious for mild fibrotic interstitial lung disease such as  nonspecific interstitial pneumonitis. Early/mild usual interstitial pneumonitis is not excluded. 2. Aortic atherosclerosis (ICD10-170.0). Coronary artery calcification. 3. Enlarged pulmonary arteries, indicative of pulmonary arterial Hypertension.- > in echo  Nov 2018: Pulmonary arteries: Systolic pressure was moderately increased.   PA peak pressure: 55 mm Hg (S). 4. Left renal stone, partially imaged.     Electronically Signed   By: Newell Eke M.D.   On: 07/22/2017 15:03  OV 11/03/2017  Chief Complaint  Patient presents with   Follow-up    Pt has SOB with  exertion, some dry cough.     Follow-up suspected interstitial lung disease in the setting of rheumatoid arthritis and long methotrexate intake  He presents with his wife.  At the time of last visit I was not fully convinced that he had interstitial lung disease.  His CT scan indicated presence of pulmonary hypertension and so did his echocardiogram.  Therefore I referred him back to Dr. Kelsie cardiology.  In the spring 2019 he did have a right heart catheterization and left heart catheterization that showed mild pulmonary hypertension but also coronary artery blockage.  He status post 2 stents.  After that his shortness of breath improved but he tells me overall his fatigue level has not improved.  In talking to him I find out that he exercises 5 times a week doing heart track 2 times a week and the other 3 days walking a mile each time and doing weight lifting.  It appears that he takes a 20-minute nap after these exercises and then feels reenergized.  His mother feels that he does not have the effort tolerance as his younger days but he denies having any symptoms of chest pain or shortness of breath or cough when he does these heavy exertion the weight lifting or walking a mile out doing heart track.  In fact in the walking desaturation test today he walked extremely fast and had no problems.  In terms of his possible interstitial lung disease he had pulmonary function test today and felt to show some improvement and on exam he does not have any crackles.  Also his wife tells me that for the last 2 months he is using CPAP for sleep apnea and this is also helping him.  Right Heart Pressures RHC Procedural Findings: Hemodynamics (mmHg) RA mean 2 RV 42/6 PA 42/8, mean 21 PCWP mean 7 LV 134/8 AO 147/52       OV 06/01/2019  Subjective:  [name], male , DOB: 04-02-1941 , age 3 y.o. , MRN: [id number] , AD[address]ESS: [phone] [place] [address]   06/01/2019 -   Chief  Complaint  Patient presents with   Follow-up    Pt states he has been doing okay since last visit but states he has been having a little more SOB x4 weeks now. Pt also has occ coughing with yellow phlegm.   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 -returns for follow-up.  I personally have not seen him since the summer 2019.  He says overall he has been stable.  In August 2020 he had a CT scan of the chest that confirmed the presence of interstitial lung disease in the setting of his rheumatoid arthritis.  He was stable.  His pulmonary function test was stable.  He says now in the last 2 months has had a decline in shortness of breath.  There is also some cough with sputum production but that has resolved.  It is definitely present with exertion but relieved by rest.  His walking desaturation test compared to 18 months ago is roughly the same except that he is very tachycardic.  He does have a pacemaker.  He has an appoint with Dr. Kelsie his electrophysiologist today.  His symptom scores are listed below.     ROS - per HPI     OV 08/01/2019 - telephine visit - identified with 2 person identifier. Telephone visit - limits, risks benefits explained  Subjective:  [name], male , DOB: 12/23/1940 , age 65 y.o. , MRN: [id number] , ADDRESS: [phone] [place] [address]   08/01/2019 -  Follow-up  interstitial lung disease in the setting of rheumatoid arthritis and long methotrexate intake   HPI Cameron [name] 83 y.o. - similar dyspnea compared to [facility] 2021.  No better nor worse.  After last visit he has seen cardiology x2.  It appears the final conclusion is that diastolic dysfunction might be contributing to his shortness of breath.  But overall not major changes in his cardiac care.  He tells me that overall he is stable.  He had spirometry and DLCO the DLCO itself appears to be  reduced compared to the recent 1 but stable compared to older ones.  The FVC suggest decline.  Patient himself feels stable.  Overall some mixed picture.  Last high-resolution CT chest was October 2020.    IMPRESSION: 1. 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 There is no significant air trapping on expiratory phase imaging. Findings are not significantly changed compared to prior examinations and remain in an indeterminate for UIP pattern by ATS pulmonary fibrosis criteria, differential considerations including both UIP and NSIP.   2.  Coronary artery disease and aortic atherosclerosis.   3.  Left nephrolithiasis.     Electronically Signed   By: Marolyn Jaksch M.D.   On: 01/12/2019 15:08    OV 10/17/2019  Subjective:  [name], male , DOB: 1940/08/02 , age 41 y.o. , MRN: [id number] , AD[address]: [phone] [place] [address]   10/17/2019 -   Chief Complaint  Patient presents with   Follow-up    pt states sobwhen doing activities.   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 -presents for follow-up after having his spirometry and DLCO and high-resolution CT chest.  Overall he feels stable compared to the last visit but he says definitely compared to 2 years ago his symptoms are worse.  Compared to 1 year ago his symptoms are the same.  He is kind of leery of taking new medications.  His high-resolution CT scan of the chest indicates mild progression since 2019 February.  His pulmonary function tests also indicate progression compared to 2 years ago but fluctuant in more recent times.  He is reporting Cameron orange exposure and is  wondering if his ILD could be related to that.  He reminded me that he is already on the ILD-pro registry study.  His next scheduled visit is in October 2021.   High-resolution CT chest May 2021 Lungs/Pleura: Peripheral and basilar predominant subpleural interlobular and intra  lobular septal thickening and ground-glass. Findings persist on prone imaging and appear similar to 01/12/2019 but may be minimally progressive from 07/22/2017. 4 mm peripheral left lower lobe nodule (14/101), stable from 07/22/2017 and considered benign. Lungs are otherwise clear. No pleural fluid. Airway is unremarkable. Mild air trapping.   Upper Abdomen: Visualized portions of the liver and adrenal glands are unremarkable. Stones are seen in the kidneys bilaterally. Spleen and visualized portions of the pancreas, stomach and bowel are grossly unremarkable. Cholecystectomy. No upper abdominal adenopathy.   Musculoskeletal: Degenerative changes in the spine. No worrisome lytic or sclerotic lesions.   IMPRESSION: 1. Pulmonary parenchymal pattern of fibrosis appears grossly stable from 01/12/2019 but may be minimally progressive from 07/22/2017. 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 Usual interstitial pneumonitis is certainly not excluded. Findings are indeterminate for UIP per consensus guidelines: Diagnosis of Idiopathic Pulmonary Fibrosis: An Official ATS/ERS/JRS/ALAT Clinical Practice Guideline. Am JINNY Honey Crit Care Med Vol 198, Iss 5, [id number], Jan 24 2017. 2. Bilateral renal stones. 3. Aortic atherosclerosis (ICD10-I70.0). Coronary artery calcification. 4. Enlarged pulmonic trunk, indicative of pulmonary arterial hypertension.     Electronically Signed   By: Newell Eke M.D.   On: 10/10/2019 14:00  ROS - per HPI     OV 06/29/2020  Subjective:  [name], male , DOB: 1941/03/28 , age 47 y.o. , MRN: [id number] , ADDRESS: [address] [place] [address] PCP [name], MD Patient Care Team: [name], MD as PCP - General (Unknown Physician Specialty) Kelsie Lynwood, MD as PCP - Electrophysiology (Cardiology) Burnard Debby DELENA, MD as PCP - Cardiology (Cardiology) Shlomo Wilbert SAUNDERS, MD as PCP - Sleep Medicine (Cardiology)  This Provider for this visit: Treatment Team:  Attending Provider: Geronimo Amel, MD    06/29/2020 -   Chief Complaint  Patient presents with   Follow-up    SOB unchanged, slight nonproductive cough. Esbriet  doing well.   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 Normal echo Jan 2021   HPI Cameron [name] 83 y.o. -returns for follow-up.  He presents with his wife.  Last seen in May 2021.  After that in June 2021 when he started pirfenidone  for progressive ILD.  He tells me that he has been tolerating pirfenidone  just fine.  Of note he has not had any liver function test since he started pirfenidone .  He is not having any GI side effects of skin side effects from the pirfenidone .  In the last 6 months his ILD stable according to his history.  His walking desaturation test and symptom scores are stable.  He is up-to-date with his Covid vaccine.  He is on leflunomide .  He is on the ILD-pro registry study with a last visit was in November 2021.  He needs a visit every 6 months.  He is willing to get a Covid IgG checked because he is immunosuppressed.  This is in response to humoral immunity to vaccine     OV 04/09/2021  Subjective:  [name], male , DOB: Mar 28, 1941 , age 39 y.o. , MRN: [id number] , ADDRESS: 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 Normal echo Jan 2021  HPI Cameron [name] 83 y.o. -returns for follow-up.  He tells me that in the last 6 months since his last visit he is having progressively more shortness of breath with exertion relieved by rest.  In fact his dyspnea score shows worsening to a total score of 9 compared to earlier in the year.  But there is no worsening cough.  He had pulmonary function test that shows continued stability since 2021 feb but definitely worse since 2019 and 2020.  In other words the stability correlates with him starting pirfenidone .  Explained to him that the pirfenidone  is helping his ILD stability.  Nevertheless his symptoms are worse.  He had echocardiogram yesterday that shows continued elevation in pulmonary artery pressures [2019 borderline elevation] and grade 2 diastolic dysfunction.  Of note he had his ILD-Pro registry research visit today. Last liver function test for drug-induced liver injury monitoring was in February 2022.  GFR at that time was 57.  In March 2022.  His wife has suspected ILD and she is also with him in this visit.  I saw her for a scheduled visit just earlier.      OV  06/13/2021  Subjective:  [name], male , DOB: 11/21/1940 , age 53 y.o. , MRN: [id number] , ADDRESS: [address] [place] [address] PCP [name], MD Patient Care Team: [name], MD as PCP - General (Unknown Physician Specialty) Kelsie Agent, MD as PCP - Electrophysiology (Cardiology) Burnard Debby LABOR, MD as PCP - Cardiology (Cardiology) Shlomo Wilbert SAUNDERS, MD as PCP - Sleep Medicine (Cardiology)  This Provider for this visit: Treatment Team:  Attending Provider: Geronimo Amel, MD    06/13/2021 -   Chief Complaint  Patient presents with   Follow-up    Pt is following up after recent heart cath.    HPI Cameron [name] 83 y.o. -returns for follow-up to discuss the implications of the right heart cath results.  He had right heart cath on 05/29/2021.  Around this time he also had some mild anemia that is documented below.  But his recent creatinine is normal.  His right heart cath shows worsening of mean arterial pressure compared to a few years ago but his PVR is low and his wedge is high.  He also has moderate mitral regurgitation and had a high V wave.  Compared to last visit he stable but over time he said worsening dyspnea.  He is here with his wife.  I told them both that this looks like pulmonary venous hypertension.  He does not have edema.  We discussed the possibility of doing Lasix   therapy.  I told him he does not meet official criteria for inhaled treprostinil and WHO group 3 pulmonary hypertension.  For inhaled treprostinil he would need a wedge less than 15 and a PVR greater than 3.  He does not have that.  He is agreed to do Lasix  at this point.  He will have to follow-up with Dr. Debby Sor for this.  In terms of his interstitial lung disease he has a surveillance CT scan coming up in March 2023 [last CT was then May 2021].  I think we will postpone this and just see him in 4 to 6 months for ILD follow-up given recent stability and  pulmonary function test.  What he needs is follow-up for his shortness of breath and to see how the Lasix  is performing.  For this we will make a nurse practitioner visit and also encouraged him to go and see Dr. Sor.  CT Chest data  No results found.   07/11/2021: Today - follow up Patient presents today with wife via virtual visit for follow up after being started on lasix  for mild pulmonary HTN. He states he started the medication but has since stopped due to some bladder issues. He reports his breathing has been stable and overall, he feels pretty well. He denies any lower extremity swelling, palpitations, orthopnea, PND, cough or wheezing. He is doing well on Esbriet . He has developed a rash over the last 3-4 days after being transitioned from metformin  to [place]. He denies any facial or tongue swelling, difficulties breathing or swallowing, or N/V.     OV 10/01/2021  Subjective:  [name], male , DOB: 05/03/1941 , age 39 y.o. , MRN: [id number] , ADDRESS: [address] [place] [address] PCP [name], MD Patient Care Team: [name], MD as PCP - General (Unknown Physician Specialty) Kelsie Lynwood, MD as PCP - Electrophysiology (Cardiology) Sor Debby DELENA, MD as PCP - Cardiology (Cardiology) Shlomo Wilbert SAUNDERS, MD as PCP - Sleep Medicine (Cardiology)  This Provider for this visit: Treatment Team:  Attending Provider: Geronimo Amel, MD    10/01/2021 -   Chief Complaint  Patient presents with   Follow-up    PFT performed today.  Pt states he has been doing good since last visit and denies any complaints.    HPI Cameron [name] 83 y.o. -returns for routine follow-up.  After I saw him in January 2023 we gave him a trial of Lasix  because of diastolic dysfunction and pulmonary venous hypertension but this did not help.  He denied acute video visit in March 2023 because of COVID-19.  He took antiviral.  Currently doing well except for  the fact that he has had increased fatigue compared to baseline [the symptom score].  The fatigue is significant especially by the afternoon.  Also for the last few weeks he has had diarrhea that is moderate in intensity.  There is no changes in his medications.  Over time his creatinine has gotten worse.  He does have chronic kidney disease and is seen his primary care.  He also tells me that he was taking potassium and recently has had hyperkalemia.  This was getting worse as of yesterday with a potassium of 6.1 mEq.  This was at outside facility and I was able to review the lab results.  His primary care is intently watching this.  He is off his diuretics and potassium supplementation.  I have instructed him to have  repeat potassium today.  In terms of his ILD it is stable.  Pulmonary function tests are stabilized.  His CT scan of the chest was reviewed and this shows a stable ILD in the last 2 years.  Unclear to me if he is still on leflunomide  or not. He is supposed have a research registry follow-up visit today.  Creatinine profile shows a slow worsening over time.          OV 11/27/2021  Subjective:  [name], male , DOB: 1941-02-06 , age 29 y.o. , MRN: [id number] , ADDRESS: [address] [place] [address] PCP [name], MD Patient Care Team: [name], MD as PCP - General (Unknown Physician Specialty) Kelsie Lynwood, MD as PCP - Electrophysiology (Cardiology) Burnard Debby DELENA, MD as PCP - Cardiology (Cardiology) Shlomo Wilbert SAUNDERS, MD as PCP - Sleep Medicine (Cardiology)  This Provider for this visit: Treatment Team:  Attending Provider: Geronimo Amel, MD    11/27/2021 -   Chief Complaint  Patient presents with   Follow-up    Pt to discuss Esbriet . Pt still doing good since LOV.    HPI Cameron [name] 83 y.o. -returns for follow-up.  At the last visit in May 2023 because of rising creatinine we reduce his pirfenidone  dose  particularly in the setting of fatigue.  It seems lowering the pirfenidone  dose does not really help the fatigue but it does seem to overall make him feel better.  He is stable.  His dyspnea is better.  His diarrhea is very minimal to nonexistent.  He had pulmonary function test and this shows continued stability since being on pirfenidone .  He had ILD-Pro registry visit in the spring 2023.  His next visit is due sometime around October 2023.  We discussed his rising creatinine.  I advised him to stay on the lower dose of the pirfenidone .  This would be 2 pills 3 times daily.  He did indicate and his wife also indicated that he is having difficult time controlling his hyperkalemia.  Review of his medication shows that he is on ARB s [telmisartan ].  Discussed this is a potential contributory etiology for his hyperkalemia.  He is willing to get his creatinine and his potassium checked today in the office.  But his primary care is the one who is managing that.   Noted he is on fish oil but he says this actually helps him with his arthritis.     OV 05/06/2022  Subjective:  [name], male , DOB: 08/22/1940 , age 40 y.o. , MRN: [id number] , ADDRESS: [address] [place] [address] PCP [name], MD Patient Care Team: [name], MD as PCP - General (Unknown Physician Specialty) Kelsie Lynwood, MD as PCP - Electrophysiology (Cardiology) Burnard Debby DELENA, MD as PCP - Cardiology (Cardiology) Shlomo Wilbert SAUNDERS, MD as PCP - Sleep Medicine (Cardiology) Duke, Jon Garre, PA as Physician Assistant (Cardiology)  This Provider for this visit: Treatment Team:  Attending Provider: Geronimo Amel, MD    05/06/2022 -   Chief Complaint  Patient presents with   Follow-up    Pt states he has been doing okay since last visit and denies any complaints.     SABRA  HPI Cameron [name] 83 y.o. -returns for follow-up.  Presents with his wife.  He feels he is doing  stable.  Symptom score shows stability but to me he obviously looks  like he has lost weight.  He then told me that ever since COVID earlier in the year he is having dysgeusia and low appetite and therefore he is losing weight.  Review of the records indicate he is lost 20 pounds in the last 1 year.  I did indicate to him that concern of COVID causing his dysgeusia is pirfenidone  could be causing his dysgeusia and low appetite and weight loss.  He is willing to give a short holiday and reassess.  He has not had pulmonary function test recently but he feels stable.  He is due for monitoring safety labs with his pirfenidone  high risk medication.  He continues on immunosuppression.      OV 08/28/2022  Subjective:  [name], male , DOB: 1940-11-22 , age 57 y.o. , MRN: [id number] , ADDRESS: [address] [place] [address] PCP [name], MD Patient Care Team: [name], MD as PCP - General (Unknown Physician Specialty) Kelsie Lynwood, MD (Inactive) as PCP - Electrophysiology (Cardiology) Burnard Debby DELENA, MD as PCP - Cardiology (Cardiology) Shlomo Wilbert SAUNDERS, MD as PCP - Sleep Medicine (Cardiology) [facility], Jon Garre, PA as Physician Assistant (Cardiology)  This Provider for this visit: Treatment Team:  Attending Provider: Geronimo Amel, MD    08/28/2022 -   Chief Complaint  Patient presents with   Follow-up    Fatigue, sob  HPI Cameron [name] 83 y.o. -last seen in December 2023.  At that time he was having dysgeusia after COVID.  Also having weight loss.  We do not know if pirfenidone  was involved so told him to stop it for a few weeks which she did but it did not really resolve his symptoms.  He presents for follow-up but it appears in January 2024 [actually between 05/23/2022 - 05/30/2022] he was admitted with melena and upper GI bleed got 3 units of blood.  Extensive endoscopy colonoscopy and camera test were all negative.  Since then they have been  monitoring his hemoglobin at home and it is improved.  He did follow-up with Dr. GEANNIE Kidney on 07/01/2022 at GI clinic.  He states ever since then he feels a lot more fatigue despite improvement in hemoglobin he is more short of breath.  His mood is also low.  He has been started by the [facility] yesterday/3/24 and Zoloft but he denies being sad.  Although depression has been diagnosed.  Definitely not suicidal.  He is unable to understand his fatigue.  Wife also affirms the same.  Review of his labs included 07/01/2022 low vitamin D  but is not on supplements and abnormal TSH in 2018 which she believes has not been rechecked.  He believes he is lost weight but according to our indicators his weight is stable  His last echocardiogram was in November 2022 And his last high-resolution CT chest was in May 2023   OV 09/25/2022  Subjective:  [name], male , DOB: May 30, 1940 , age 85 y.o. , MRN: [id number] , ADDRESS: [address] [place] [address] PCP [name], MD Patient Care Team: [name], MD as PCP - General (Unknown Physician Specialty) Kelsie Lynwood, MD (Inactive) as PCP - Electrophysiology (Cardiology) Burnard Debby DELENA, MD as PCP - Cardiology (Cardiology) Shlomo Wilbert SAUNDERS, MD as PCP - Sleep Medicine (Cardiology) [facility], Jon Garre, PA as Physician Assistant (Cardiology)  This Provider for this visit: Treatment Team:  Attending Provider: Geronimo Amel,  MD    09/25/2022 -   Chief Complaint  Patient presents with   Follow-up    F/up prescription, weight loss     HPI Cameron [name] 83 y.o. -returns for follow-up.  I saw him a month ago for his weight loss and dysgeusia.  We had him stop pirfenidone .  He stopped it he is not taking it for the last 1 month.  His wife is here with him and she is independent historian.  They are alarmed of the weight loss there is more weight loss is at least 20 pounds of weight loss in the last 1 month.   Yesterday was started on Aldactone  for his new onset heart failure which we diagnosed after his last visit.  He saw Dr. Debby Sor for that.  He is also got an iron  infusion yesterday for his unexplained anemia.  Review of medication shows that he is on Areva which can cause weight loss.  He temporarily stopped it 2 weeks ago but restarted it 2 days ago.  He restarted it because his arms were hurting.  He is also on Jardiance for the last 2 years and this correlates with the time.  his weight loss although he was on pirfenidone  at the same time but the fact that he is losing weight despite coming off pirfenidone .  Did simple exercise hypoxemia test.  I made him do a sit/stand test for ten reps.  He did not desaturate   Last Weight  Most recent update: 09/25/2022  1:03 PM    Weight  71.8 kg (158 lb 6.4 oz)                OV 11/20/2022  Subjective:  [name], male , DOB: 06-Sep-1940 , age 37 y.o. , MRN: [id number] , ADDRESS: [address] [place] [address] PCP [name], MD Patient Care Team: [name], MD as PCP - General (Unknown Physician Specialty) Sor Debby DELENA, MD as PCP - Cardiology (Cardiology) Shlomo Wilbert SAUNDERS, MD as PCP - Sleep Medicine (Cardiology) Cindie Ole DASEN, MD as PCP - Electrophysiology (Cardiology) [facility], Jon Garre, PA as Physician Assistant (Cardiology)  This Provider for this visit: Treatment Team:  Attending Provider: Geronimo Amel, MD    11/20/2022 -   Chief Complaint  Patient presents with   Follow-up    F/up, no complaints.     HPI Cameron [name] 83 y.o. -returns for follow-up.  Presents with his wife wife is an independent historian.  She gives most of the history.  But they both aligned at the information.   #Symptoms of weight loss, dysgeusia and fatigue  He is now off pirfenidone  since early April 2024.  He states that his taste is improved.  He also tells me that his weight loss is  resolving [see below and it actually is resolving.].  His fatigue is also improving.  The temporal events related to his improvement in fatigue and weight weight loss and also loss of taste is the following   -Holding pirfenidone  since early April 2024  -Diagnosis of systolic heart failure on the echocardiogram April 2021 starting Aldactone  by Dr. Debby Sor   -Saw primary care Reyes Auston and review of the records indicate 10/29/2022 he had C. difficile toxin positivity on stool test.  In fact even before this on GI visit 10/16/2022 with Dr. Federico she noticed that outside he had a test called GI 360 with life extension which showed  positive C. difficile toxin and high fecal calprotectin.  He took Dificid  course  -Also during this time he has seen 1 Dr. Fairy Pouch in [place] was an MD but practices homeopathy.  2 weeks ago he was started on ivermectin and he feels within 2 days his symptoms started improving.  -Of all these measures he feels ivermectin is the one that helped him the most.   -He at this point and his wife want to restart the pirfenidone  but he is on ivermectin   #Pulmonary fibrosis: He feels it is stable.  In fact symptom scores are stable.  Excess hypoxemia test is stable.  He has not had pulmonary function test today at this visit.  The stability is compared to the last visit.  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  This because when I first met him his chart review at [facility] rheumatology could not in clinic suggested he had seronegative rheumatoid arthritis.  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  This diagnosis was given at sometime in the past.  I do not know about this.  He is on Areva for this.  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  However there have been no impact to the management.     OV 01/22/2023  Subjective:  [name], male , DOB: 08/30/1940 , age 29 y.o. , MRN: [id number] , ADDRESS: [address] [place] [address] PCP [name], MD Patient Care Team: [name], MD as PCP - General (Unknown Physician Specialty) Burnard Debby DELENA, MD as PCP - Cardiology (Cardiology) Shlomo Wilbert SAUNDERS, MD as PCP - Sleep Medicine (Cardiology) Cindie Ole DASEN, MD as PCP - Electrophysiology (Cardiology) [facility], Jon Garre, PA as Physician Assistant (Cardiology)  This Provider for this visit: Treatment Team:  Attending Provider: Geronimo Amel, MD   01/22/2023 -   Chief Complaint  Patient presents with   Follow-up    Review PFT.  Cough x 2 weeks.  Patient states he had a cold 2 weeks ago.     HPI Cameron [name] 83 y.o. -returns for follow-up.  Presents with his wife Orie she is an independent historian.  They both tell me that he continues with his ivermectin.  He has 1 week left on it.  He has now regained his weight completely.  He is back to baseline.  Dysgeusia is almost gone.  He attributes this to ivermectin and also stopping the pirfenidone .  Pulmonary function test and it is stable.  Symptoms are stable.  Recently had a cold wife also had a cold he is coughing because of that.  But it is resolving.  He does not want to take antibiotic and prednisone  for this.  He brought to my attention right lower extremity.  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  I gave him some advice on that.       OV 12/02/2023  Subjective:  [name], male , DOB: 04/19/1941 , age 58 y.o. , MRN: [id number] , ADDRESS: [address] [place] [address] PCP [name], MD Patient Care Team: [name], MD as PCP - General (Unknown Physician  Specialty) Burnard Debby LABOR, MD as PCP -  Cardiology (Cardiology) Shlomo Wilbert SAUNDERS, MD as PCP - Sleep Medicine (Cardiology) Cindie Ole DASEN, MD as PCP - Electrophysiology (Cardiology) [facility], Jon Garre, PA as Physician Assistant (Cardiology) Jacobo Evalene PARAS, MD as Consulting Physician (Oncology)  This Provider for this visit: Treatment Team:  Attending Provider: Geronimo Amel, MD   Arthritis - > do [facility] clinic rheumatology -2019 notes at [facility] clinic suggest seronegative rheumatoid arthritis ` -11/18/2022 given a diagnosis of psoriatic arthritis by Dr. 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  However and noticed that [facility] rheumatology on 11/18/2022 gave your diagnosis of psoriatic arthritis.  This means we might have to change your interstitial lung disease as IPF esp with Cameron organge exposuer.  However this does not impact current treatment recommendation.) History of Cameron orange exposure   -on Arava ,  ? Pn med list May 2023  = Started pirfenidone  June 2021 -> held April 2024 secondary to weight loss and dysgeusia and fatigue.  - ILD-Pro registry stdy  - last visit 04/09/2021  - Last HRCT May 2021 -. May 2023 without change (indeterminate)   RHC 05/29/21 - Mild pulmonary hypertension in this patient with interstitial lung disease with elevated RV 54/12; PA pressure 55/18; mean 31 mmHg.  PVR 2.6 WU. PW mean pressure is 17 mmHg, with V wave 24 c/w his echo documented mild - moderate mitral regurgitation.   CKD -   -07/13/2019: Creatinine 1.2 mg percent -05/23/2021 creatinine 1.15 mg percent  - 06/13/21 creat 1.6mg %, GFR 39 - 09/30/21 creat 1.83m GFR 42 - 10/16/21 - creat 1.84mg % - GFR 34 -11/27/2021: Creatinine 1.31 mg percent\\ - 05/30/22 - cret 0.95mg %  Mild COVID early 2023  Admission for blood loss anemia -without identifiable source in January 2024 status post 3 units of blood  -Started iron  infusion Sep 24, 2022 with Dr. Jacobo  -Hemoglobin 11.7 g% 08/31/2022.   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 Debby Burnard Sep 24, 2022  Norovirus infection 2018 Incidental/atypical C. difficile with high calprotectin -.  In May 2024   Unintentional weight loss as below   - in symptom chart  - esbriet  and improved with ivermectin - 2024  12/02/2023 -  No chief complaint on file.    HPI Cameron [name] 83 y.o. -     SYMPTOM SCALE - ILD 10/17/2019 Started pirfenidone  June 2021 06/29/2020  04/09/2021 193# 10/01/2021 Esbriet  lower dose since may 2023  186# 05/06/2022 Esbiret lowe rdose due to CKD  173# 08/28/2022 177# Esbiret low dose 09/25/2022 158# Off esbriet  since April 2024 visit xxxxx 11/20/2022 160# - s/ p C diff Rx end/may 2024 and IVermetcin starting mide June 2024 01/22/2023 179# - supporitve care  O2 use ra ra ra ra ra ra ra ra  Shortness of Breath 0 -> 5 scale with 5 being worst (score 6  If unable to do)         At rest 0 0 0 0 1 2 0 0  Simple tasks - showers, clothes change, eating, shaving 1 1 0 0 0 2 0 0  Household (dishes, doing bed, laundry) 1 0 0 0 0 2 0 0  Shopping 0 0 1 0 1 2 0 2  Walking level at own pace 2 0 4 0 2 3 0 3  Walking up Stairs 3 3 4 3 3 4  2.5 4  Total (30-36) Dyspnea Score 7 4 9 3 7 15  2.5 9  How bad is your cough? x 1  0 2 0 0 3  How bad is your fatigue 4 2 2 4 2 5 3 3   How bad is nausea 0 0 0 0 0 2 0 0  How  bad is vomiting?  0 0 0 0 0 0 0 0  How bad is diarrhea? 1 0 0 0.5 0 5 0 0  How bad is anxiety? 1 1 1 2 1 2 2 2   How bad is depression 1 1  1 1 1 4 2 1         Simple office walk 185 feet x  3 laps goal with forehead probe 11/03/2017  06/01/2019  06/29/2020  04/09/2021  10/01/2021  09/25/2022  11/20/2022   O2 used Room air Room air  ra ra ra ra  Number laps completed 3 3  3      Comments about pace Fast very  99% and 66    Sit stand 10 tmes Sit stand x 15 tmes  Resting Pulse Ox/HR 100% and 70/min 99% and 66/min 97% and 71 100% and Pulse 77 100% and  HR 67 98% and HR 53 98% and HR 63  Final Pulse Ox/HR 98% and 121/min 98% and 120 min 98% and 121/min 99% and HR 114 97% and HR 112 98% and HR 110 97% an dHR 115  Desaturated </= 88% no    no    Desaturated <= 3% points no    Yes 3 pnts    Got Tachycardic >/= 90/min yes    yes    Symptoms at end of test none Mild dyspea No dyspnea Dyspnea at end Noe copmpaint Mild ydpnes   Miscellaneous comments veryu fast Mod pace fast Fast pace moderate     CT Chest data from date: ****  - personally visualized and independently interpreted : *** - my findings are: ***   PFT     Latest Ref Rng & Units 01/21/2023   11:20 AM 08/25/2022   10:58 AM 10/01/2021   10:03 AM 04/17/2020    9:34 AM 10/13/2019    3:53 PM 07/21/2019    3:53 PM 01/17/2019   12:39 PM  PFT Results  FVC-Pre L 2.77  2.80  3.27  3.05  3.08  2.92  3.41   FVC-Predicted Pre % 86  81  93  86  86  81  94   Pre FEV1/FVC % % 83  84  84  81  81  82  82   FEV1-Pre L 2.30  2.34  2.75  2.46  2.49  2.40  2.78   FEV1-Predicted Pre % 102  97  112  98  98  94  107   DLCO uncorrected ml/min/mmHg 18.55  20.12  18.84  19.38  19.58  20.08  21.58   DLCO UNC% % 88  91  85  87  87  89  96   DLCO corrected ml/min/mmHg 21.63  22.73  18.63  19.38  19.58  20.08    DLCO COR %Predicted % 103  103  84  87  87  89    DLVA Predicted % 125  141  92  105  101  112  104        LAB RESULTS last 96 hours VAS US  ABI  WITH/WO TBI Result Date: 12/02/2023  LOWER EXTREMITY DOPPLER STUDY Patient Name:  RATHANA VIVEROS  Date of Exam:   11/30/2023 Medical Rec #: [id number]        Accession #:    [id number] Date of Birth: 1941/01/29        Patient Gender: M Patient Age:   8 years Exam Location:  [facility] Procedure:      VAS US  ABI WITH/WO TBI Referring Phys: Carlin Nest C --------------------------------------------------------------------------------  Indications: Peripheral artery disease. Patient repots burning sensations              throughout the right lower extremity with various exercises. He              also has the same symptoms when walking. He walks about 3 miles per              week and states he just pushes through the pain and does not stop. High Risk         Hypertension, hyperlipidemia, Diabetes, past history of Factors:          smoking.  Comparison Study: NA Performing Technologist: Nanetta Shad RVT  Examination Guidelines: A complete evaluation includes at minimum, Doppler waveform signals and systolic blood pressure reading at the level of bilateral brachial, anterior tibial, and posterior tibial arteries, when vessel segments are accessible. Bilateral testing is considered an integral part of a complete examination. Photoelectric Plethysmograph (PPG) waveforms and toe systolic pressure readings are included as required and additional duplex testing as needed. Limited examinations for reoccurring indications may be performed as noted.  ABI Findings: +---------+------------------+-----+-----------+--------+ Right    Rt Pressure (mmHg)IndexWaveform   Comment  +---------+------------------+-----+-----------+--------+ Brachial 145                                        +---------+------------------+-----+-----------+--------+ PTA      218               1.50 multiphasic         +---------+------------------+-----+-----------+--------+ DP       166               1.14 monophasic           +---------+------------------+-----+-----------+--------+ Great Toe73                0.50 Abnormal            +---------+------------------+-----+-----------+--------+ +---------+------------------+-----+----------+-------+ Left     Lt Pressure (mmHg)IndexWaveform  Comment +---------+------------------+-----+----------+-------+ Brachial 137                                      +---------+------------------+-----+----------+-------+ PTA      254               1.75 biphasic          +---------+------------------+-----+----------+-------+ DP       254  1.75 monophasic        +---------+------------------+-----+----------+-------+ Great Toe56                0.39 Abnormal          +---------+------------------+-----+----------+-------+ +-------+----------------+-----------+------------+------------+ ABI/TBIToday's ABI     Today's TBIPrevious ABIPrevious TBI +-------+----------------+-----------+------------+------------+ Right  1.50            .50                                 +-------+----------------+-----------+------------+------------+ Left   non-compressible.39                                 +-------+----------------+-----------+------------+------------+   Summary: Right: Resting right ankle-brachial index indicates noncompressible right lower extremity arteries. The right toe-brachial index is abnormal. Left: Resting left ankle-brachial index indicates noncompressible left lower extremity arteries. The left toe-brachial index is abnormal. *See table(s) above for measurements and observations.  Suggest follow up LE Arterial duplex. Electronically signed by Dorn Ross MD on 12/02/2023 at 1:40:58 PM.    Final          has a past medical history of Anemia, Anxiety (self recent), Appendicitis, Atrial fibrillation (HCC) (12/12/2013), Bone cancer (HCC) (father  35), BPH (benign prostatic hypertrophy), CAD (coronary artery disease)  (12/2015), Chest pain (12/03/2015), CHF (congestive heart failure) (HCC), Complete heart block (HCC), Coronary artery disease, Coronary artery disease involving native coronary artery of native heart with unstable angina pectoris (HCC) (08/11/2017), D-dimer, elevated (04/03/2017), Depression (self recent), Drug reaction (07/11/2021), GERD (gastroesophageal reflux disease), History of blood transfusion (1968), History of kidney stones, History of SCC (squamous cell carcinoma) of skin (07/24/2020), Hyperglycemia (11/05/2013), Hyperlipidemia (11/05/2013), Hypertension, Hypothyroidism, Hypothyroidism, unspecified (11/05/2013), 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   reports that he quit smoking about 51 years ago. His smoking use included cigarettes. He started smoking about 56 years ago. He has a 5 pack-year smoking history. He quit smokeless tobacco use about 50 years ago.  His smokeless tobacco use included chew.  Past Surgical History:  Procedure Laterality Date   BACK SURGERY     BALLOON DILATION N/A 05/24/2022   Procedure: BALLOON DILATION;  Surgeon: Federico Rosario BROCKS, MD;  Location: [facility] ENDOSCOPY;  Service: Gastroenterology;  Laterality: N/A;   CARDIAC CATHETERIZATION N/A 12/28/2015   Procedure: Left Heart Cath and Coronary Angiography;  Surgeon: Victory LELON Sharps, MD;  Location[facility] INVASIVE CV LAB;  Service: Cardiovascular;  Laterality: N/A;   CATARACT EXTRACTION W/ INTRAOCULAR LENS  IMPLANT, BILATERAL Bilateral    COLONOSCOPY WITH PROPOFOL  N/A 05/25/2022   Procedure: COLONOSCOPY WITH PROPOFOL ;  Surgeon: Federico Rosario BROCKS, MD;  Location: [name] ENDOSCOPY;  Service: Gastroenterology;  Laterality: N/A;   CORONARY ANGIOPLASTY WITH STENT  PLACEMENT  08/11/2017   2 stents   CORONARY STENT INTERVENTION N/A 08/11/2017   Procedure: CORONARY STENT INTERVENTION;  Surgeon: Burnard Debby LABOR, MD;  Location: [facility] INVASIVE CV LAB;  Service: Cardiovascular;  Laterality: N/A;   CYSTOSCOPY W/ STONE MANIPULATION     ESOPHAGOGASTRODUODENOSCOPY (EGD) WITH PROPOFOL  N/A 05/24/2022   Procedure: ESOPHAGOGASTRODUODENOSCOPY (EGD) WITH PROPOFOL ;  Surgeon: Federico Rosario BROCKS, MD;  Location: [facility] ENDOSCOPY;  Service: Gastroenterology;  Laterality: N/A;   GIVENS CAPSULE STUDY  N/A 05/25/2022   Procedure: GIVENS CAPSULE STUDY;  Surgeon: Federico Rosario BROCKS, MD;  Location: [facility] ENDOSCOPY;  Service: Gastroenterology;  Laterality: N/A;   INGUINAL HERNIA REPAIR Left    INSERT / REPLACE / REMOVE PACEMAKER  12/09/2011   SJM Acc[employer] DR RF implanted by DR Allred for complete heart block and syncope   JOINT REPLACEMENT     LAPAROSCOPIC CHOLECYSTECTOMY     LEAD REVISION/REPAIR N/A 08/09/2020   Procedure: LEAD REVISION/REPAIR;  Surgeon: Kelsie Agent, MD;  Location: [facility] INVASIVE CV LAB;  Service: Cardiovascular;  Laterality: N/A;   LITHOTRIPSY     LUMBAR DISC SURGERY     removed arthritis and spurs   PERMANENT PACEMAKER INSERTION N/A 12/09/2011   Procedure: PERMANENT PACEMAKER INSERTION;  Surgeon: Cameron Kelsie, MD;  Location: [facility] CATH LAB;  Service: Cardiovascular;  Laterality: N/A;   PPM GENERATOR CHANGEOUT N/A 08/09/2020   Procedure: PPM GENERATOR CHANGEOUT;  Surgeon: Kelsie Agent, MD;  Location: [facility] INVASIVE CV LAB;  Service: Cardiovascular;  Laterality: N/A;   PROSTATE SURGERY     REPLACEMENT TOTAL KNEE Right    RIGHT HEART CATH N/A 05/29/2021   Procedure: RIGHT HEART CATH;  Surgeon: Burnard Debby LABOR, MD;  Location: [facility] INVASIVE CV LAB;  Service: Cardiovascular;  Laterality: N/A;   RIGHT/LEFT HEART CATH AND CORONARY ANGIOGRAPHY N/A 08/11/2017   Procedure: RIGHT/LEFT HEART CATH AND CORONARY ANGIOGRAPHY;  Surgeon: Rolan Ezra RAMAN, MD;  Location: [facility] INVASIVE CV LAB;  Service:  Cardiovascular;  Laterality: N/A;   TRANSURETHRAL RESECTION OF PROSTATE  2017/2018    Allergies  Allergen Reactions   Ace Inhibitors Swelling    Tongue swelling, angioedema   Lisinopril      Other reaction(s): Lip swelling, O/E - lip swelling   Pirfenidone  Other (See Comments)    Unintentional weight loss and fatigue   Celecoxib Rash    Skin rash     Immunization History  Administered Date(s) Administered   Fluad Quad(high Dose 65+) 02/11/2020   Influenza Inj Mdck Quad Pf 03/25/2021   Influenza, High Dose Seasonal PF 03/26/2017, 02/11/2022   Influenza-Unspecified 05/03/2015, 02/13/2016, 03/26/2016, 03/19/2017, 03/03/2018, 03/17/2019, 04/09/2020, 04/18/2021   PFIZER Comirnaty(Gray Top)Covid-19 Tri-Sucrose Vaccine 06/11/2019, 07/02/2019, 07/04/2019, 07/18/2019, 03/01/2020, 11/15/2020   PFIZER(Purple Top)SARS-COV-2 Vaccination 06/11/2019, 07/04/2019, 07/18/2019, 03/01/2020   Pneumococcal Conjugate-13 02/11/2020   Pneumococcal Polysaccharide-23 12/10/2011, 01/30/2018   Pneumococcal-Unspecified 03/26/2016, 02/04/2018   Tdap 01/17/2016, 02/04/2018   Zoster Recombinant(Shingrix) 08/14/2020, 11/29/2020    Family History  Problem Relation Age of Onset   Pancreatic cancer Mother    Cancer Mother    Bone cancer Father    Cancer Father    Alzheimer's disease Sister    Heart attack Paternal Uncle    Arthritis Maternal Grandfather    Alzheimer's disease Paternal Grandmother    Lung cancer Paternal Grandfather      Current Outpatient Medications:    acetaminophen  (TYLENOL ) 500 MG tablet, Take 500 mg by mouth as needed for moderate pain., Disp: , Rfl:    allopurinol (ZYLOPRIM) 300 MG tablet, Take 300 mg by mouth daily., Disp: , Rfl:    Cholecalciferol (VITAMIN D3) 250 MCG (10000 UT) capsule, Take 10,000 Units by mouth daily., Disp: , Rfl:    co-enzyme Q-10 30 MG capsule, Take 30 mg by mouth 3 (three) times daily., Disp: , Rfl:    diltiazem  (CARDIZEM  CD) 120 MG 24 hr capsule, Take  120 mg by mouth daily., Disp: , Rfl:    ELIQUIS  5 MG TABS tablet, TAKE 1 TABLET TWICE A DAY,  Disp: 180 tablet, Rfl: 3   leflunomide  (ARAVA ) 10 MG tablet, Take 10 mg by mouth daily., Disp: , Rfl:    Magnesium  Oxide -Mg Supplement 500 MG TABS, Take 500 mg by mouth daily., Disp: , Rfl:    nebivolol  (BYSTOLIC ) 5 MG tablet, Take 5 mg by mouth daily., Disp: , Rfl:    nitroGLYCERIN  (NITROSTAT ) 0.4 MG SL tablet, Place 1 tablet (0.4 mg total) under the tongue every 5 (five) minutes as needed for chest pain., Disp: 75 tablet, Rfl: 3   pravastatin  (PRAVACHOL ) 20 MG tablet, Take 1 tablet (20 mg total) by mouth every evening., Disp: 90 tablet, Rfl: 3   telmisartan  (MICARDIS ) 80 MG tablet, Take 1 tablet (80 mg total) by mouth daily., Disp: 90 tablet, Rfl: 2   terbinafine  (LAMISIL ) 1 % cream, Apply 1 Application topically 2 (two) times daily., Disp: , Rfl:    thyroid  (ARMOUR) 90 MG tablet, Take 120 mg by mouth every morning., Disp: , Rfl:    triamcinolone  ointment (KENALOG ) 0.5 %, Apply 1 Application topically 2 (two) times daily., Disp: 30 g, Rfl: 0   trospium  (SANCTURA ) 20 MG tablet, Take 20 mg by mouth 2 (two) times daily., Disp: , Rfl:    TURMERIC PO, Take 720 mg by mouth., Disp: , Rfl:    UNABLE TO FIND, Take 1,500 mg by mouth daily. Med Name: Glucogold, Disp: , Rfl:    UNABLE TO FIND, Take 1,200 mg by mouth in the morning and at bedtime. Med Name: Benberine, Disp: , Rfl:    UNABLE TO FIND, Take 300 mg by mouth in the morning and at bedtime. Med Name: Dron protein plus, Disp: , Rfl:    Vibegron  (GEMTESA ) 75 MG TABS, Take 1 tablet (75 mg total) by mouth daily., Disp: 90 tablet, Rfl: 3   VTAMA  1 % CREA, APPLY 1 APPLICATION TOPICALLY DAILY, Disp: 60 g, Rfl: 5  Current Facility-Administered Medications:    sodium chloride  flush (NS) 0.9 % injection 3 mL, 3 mL, Intravenous, Q12H, Burnard Debby LABOR, MD  Facility-Administered Medications Ordered in Other Visits:    ondansetron  (ZOFRAN ) 4 mg in sodium chloride   0.9 % 50 mL IVPB, 4 mg, Intravenous, Q6H PRN, Cabbell, Kyle, MD      Objective:   There were no vitals filed for this visit.  Estimated body mass index is 29.44 kg/m as calculated from the following:   Height as of 11/06/23: 5' 7 (1.702 m).   Weight as of 11/06/23: 188 lb (85.3 kg).  @WEIGHTCHANGE @  There were no vitals filed for this visit.   Physical Exam   General: No distress. *** O2 at rest: *** Cane present: *** Sitting in wheel chair: *** Frail: *** Obese: *** Neuro: Alert and Oriented x 3. GCS 15. Speech normal Psych: Pleasant Resp:  Barrel Chest - ***.  Wheeze - ***, Crackles - ***, No overt respiratory distress CVS: Normal heart sounds. Murmurs - *** Ext: Stigmata of Connective Tissue Disease - *** HEENT: Normal upper airway. PEERL +. No post nasal drip        Assessment:     No diagnosis found.     Plan:     There are no Patient Instructions on file for this visit.   FOLLOWUP No follow-ups on file.    SIGNATURE    Dr. Dorethia Cave, M.D., F.C.C.P,  Pulmonary and Critical Care Medicine Staff Physician, [facility] Center Director - Interstitial Lung Disease  Program  Pulmonary Fibrosis [facility]  at [facility], [place], [address]  Pager: [phone], If no answer or between  15:00h - 7:00h: call [phone] Telephone: [phone]  7:36 PM 12/02/2023   Moderate Complexity MDM OFFICE  2021 E/M guidelines, first released in 2021, with minor revisions added in 2023 and 2024 Must meet the requirements for 2 out of 3 dimensions to qualify.    Number and complexity of problems addressed Amount and/or complexity of data reviewed Risk of complications and/or morbidity  One or more chronic illness with mild exacerbation, OR progression, OR  side effects of treatment  Two or more stable chronic illnesses  One undiagnosed new problem with uncertain prognosis  One acute illness with systemic  symptoms   One Acute complicated injury Must meet the requirements for 1 of 3 of the categories)  Category 1: Tests and documents, historian  Any combination of 3 of the following:  Assessment requiring an independent historian  Review of prior external note(s) from each unique source  Review of results of each unique test  Ordering of each unique test    Category 2: Interpretation of tests   Independent interpretation of a test performed by another physician/other qualified health care professional (not separately reported)  Category 3: Discuss management/tests  Discussion of management or test interpretation with external physician/other qualified health care professional/appropriate source (not separately reported) Moderate risk of morbidity from additional diagnostic testing or treatment Examples only:  Prescription drug management  Decision regarding minor surgery with identfied patient or procedure risk factors  Decision regarding elective major surgery without identified patient or procedure risk factors  Diagnosis or treatment significantly limited by social determinants of health             HIGh Complexity  OFFICE   2021 E/M guidelines, first released in 2021, with minor revisions added in 2023. Must meet the requirements for 2 out of 3 dimensions to qualify.    Number and complexity of problems addressed Amount and/or complexity of data reviewed Risk of complications and/or morbidity  Severe exacerbation of chronic illness  Acute or chronic illnesses that may pose a threat to life or bodily function, e.g., multiple trauma, acute MI, pulmonary embolus, severe respiratory distress, progressive rheumatoid arthritis, psychiatric illness with potential threat to self or others, peritonitis, acute renal failure, abrupt change in neurological status Must meet the requirements for 2 of 3 of the categories)  Category 1: Tests and documents, historian  Any  combination of 3 of the following:  Assessment requiring an independent historian  Review of prior external note(s) from each unique source  Review of results of each unique test  Ordering of each unique test    Category 2: Interpretation of tests    Independent interpretation of a test performed by another physician/other qualified health care professional (not separately reported)  Category 3: Discuss management/tests  Discussion of management or test interpretation with external physician/other qualified health care professional/appropriate source (not separately reported)  HIGH risk of morbidity from additional diagnostic testing or treatment Examples only:  Drug therapy requiring intensive monitoring for toxicity  Decision for elective major surgery with identified pateint or procedure risk factors  Decision regarding hospitalization or escalation of level of care  Decision for DNR or to de-escalate care   Parenteral controlled  substances            LEGEND - Independent interpretation involves the interpretation of a test for which there is a CPT code, and  an interpretation or report is customary. When a review and interpretation of a test is performed and documented by the provider, but not separately reported (billed), then this would represent an independent interpretation. This report does not need to conform to the usual standards of a complete report of the test. This does not include interpretation of tests that do not have formal reports such as a complete blood count with differential and blood cultures. Examples would include reviewing a chest radiograph and documenting in the medical record an interpretation, but not separately reporting (billing) the interpretation of the chest radiograph.   An appropriate source includes professionals who are not health care professionals but may be involved in the management of the patient, such as a Clinical research associate, upper  officer, case manager or teacher, and does not include discussion with family or informal caregivers.    - SDOH: SDOH are the conditions in the environments where people are born, live, learn, work, play, worship, and age that affect a wide range of health, functioning, and quality-of-life outcomes and risks. (e.g., housing, food insecurity, transportation, etc.). SDOH-related Z codes ranging from Z55-Z65 are the ICD-10-CM diagnosis codes used to document SDOH data Z55 - Problems related to education and literacy Z56 - Problems related to employment and unemployment Z57 - Occupational exposure to risk factors Z58 - Problems related to physical environment Z59 - Problems related to housing and economic circumstances [phone] - Problems related to social environment [phone] - Problems related to upbringing [phone] - Other problems related to primary support group, including family circumstances Z87 - Problems related to certain psychosocial circumstances Z65 - Problems related to other psychosocial circumstances

## 2023-12-02 NOTE — Patient Instructions (Signed)
  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  Hx of covid March 2023 - did not affect your fibrosis.  But recently stable.   Plan - -Continue monitor without pirfenidone for now  - due to side effects, current ivermectin  --Do spirometry and DLCO in 6  months and return for follow-up  - if progressive consider ofev or clinical trials  Dysgeusia Unintentional weight loss - despite stopping esbriet early April 2024 Fatigue  -Symptoms are all resolving in the last 3-4  months after stopping pirfenidone but respect your judgment that this could be because of C. difficile or positive treatment effect of ivermectin   Plan - Continue to hold pirfenidone but open to restarting it once ivermectin course is completed -No more pirfenidone   - cma to mark this as allregy   Varicose veins Right lower  Plan  - talk to PCP Marguarite Arbour, MD= =about this   Followup - 6 months return to see Dr. Marchelle Gearing ; 30-minute visit but after spirometry and DLCO  -Symptom score and exercise hypoxemia test at

## 2023-12-03 ENCOUNTER — Ambulatory Visit: Admitting: Internal Medicine

## 2023-12-03 VITALS — BP 136/48 | HR 77 | Temp 97.7°F | Ht 67.0 in | Wt 183.2 lb

## 2023-12-03 DIAGNOSIS — J84112 Idiopathic pulmonary fibrosis: Secondary | ICD-10-CM

## 2023-12-03 DIAGNOSIS — J8489 Other specified interstitial pulmonary diseases: Secondary | ICD-10-CM

## 2023-12-03 DIAGNOSIS — Z77098 Contact with and (suspected) exposure to other hazardous, chiefly nonmedicinal, chemicals: Secondary | ICD-10-CM | POA: Diagnosis not present

## 2023-12-03 DIAGNOSIS — M359 Systemic involvement of connective tissue, unspecified: Secondary | ICD-10-CM

## 2023-12-03 LAB — PULMONARY FUNCTION TEST
DL/VA % pred: 98 %
DL/VA: 3.85 ml/min/mmHg/L
DLCO cor % pred: 84 %
DLCO cor: 17.66 ml/min/mmHg
DLCO unc % pred: 78 %
DLCO unc: 16.28 ml/min/mmHg
FEF 25-75 Pre: 2.69 L/s
FEF2575-%Pred-Pre: 189 %
FEV1-%Pred-Pre: 115 %
FEV1-Pre: 2.53 L
FEV1FVC-%Pred-Pre: 115 %
FEV6-%Pred-Pre: 105 %
FEV6-Pre: 3.07 L
FEV6FVC-%Pred-Pre: 108 %
FVC-%Pred-Pre: 97 %
FVC-Pre: 3.09 L
Pre FEV1/FVC ratio: 82 %
Pre FEV6/FVC Ratio: 99 %

## 2023-12-03 NOTE — Telephone Encounter (Signed)
 Scheduled

## 2023-12-03 NOTE — Patient Instructions (Signed)
Spiro/DLCO performed today. 

## 2023-12-03 NOTE — Progress Notes (Signed)
Spiro/DLCO performed today. 

## 2023-12-05 ENCOUNTER — Other Ambulatory Visit: Payer: Self-pay | Admitting: Cardiovascular Disease

## 2023-12-08 ENCOUNTER — Ambulatory Visit: Admitting: Dermatology

## 2023-12-10 ENCOUNTER — Ambulatory Visit: Attending: Cardiology

## 2023-12-10 ENCOUNTER — Ambulatory Visit: Admitting: Dermatology

## 2023-12-10 DIAGNOSIS — R6889 Other general symptoms and signs: Secondary | ICD-10-CM | POA: Diagnosis not present

## 2023-12-10 DIAGNOSIS — I739 Peripheral vascular disease, unspecified: Secondary | ICD-10-CM | POA: Diagnosis not present

## 2023-12-11 ENCOUNTER — Ambulatory Visit: Payer: Self-pay | Admitting: Cardiology

## 2023-12-11 ENCOUNTER — Encounter: Payer: Self-pay | Admitting: Cardiology

## 2023-12-14 ENCOUNTER — Ambulatory Visit: Admitting: Dermatology

## 2023-12-14 ENCOUNTER — Encounter: Payer: Self-pay | Admitting: Dermatology

## 2023-12-14 DIAGNOSIS — L819 Disorder of pigmentation, unspecified: Secondary | ICD-10-CM

## 2023-12-14 DIAGNOSIS — L299 Pruritus, unspecified: Secondary | ICD-10-CM | POA: Diagnosis not present

## 2023-12-14 DIAGNOSIS — L821 Other seborrheic keratosis: Secondary | ICD-10-CM | POA: Diagnosis not present

## 2023-12-14 NOTE — Progress Notes (Addendum)
   Follow-Up Visit   Subjective  Cameron [name] is a 83 y.o. male who presents for the following: 6 month SK follow up at right forearm and left scalp and recheck nevus at glands penis. Patient has had some itching behind both ears and has been using Vtama  which seems to help from getting any worse.    The following portions of the chart were reviewed this encounter and updated as appropriate: medications, allergies, medical history  Review of Systems:  No other skin or systemic complaints except as noted in HPI or Assessment and Plan.  Objective  Well appearing patient in no apparent distress; mood and affect are within normal limits.   A focused examination was performed of the following areas: Scalp, postauricular,   Relevant exam findings are noted in the Assessment and Plan.  glands penis 5.0 mm x 4.0 mm melanotic macule (darkest lesion). Peripheral lighter brown macules near the darkest one and on the left side x 2   Assessment & Plan   SEBORRHEIC KERATOSIS - Stuck-on, waxy, tan-brown papules and/or plaques  - Benign-appearing - Discussed benign etiology and prognosis. - Observe - Call for any changes  Pruritus Exam: clear at postauricular skin and scalp  Treatment Plan: Recommend Gold Bond with lidocaine  and Samples of CeraVe anti-itch given to patient for PRN use daily   PIGMENTED SKIN LESIONS glands penis Recommended biopsy. Patient defers biopsy and prefers observation. SEBORRHEIC KERATOSES   PRURITUS    Return for TBSE, as scheduled, HxSCC, with Dr. Claudene.  LILLETTE [name], RMA, am acting as scribe for Boneta Claudene, MD .   Documentation: I have reviewed the above documentation for accuracy and completeness, and I agree with the above.  Boneta Claudene, MD

## 2023-12-14 NOTE — Patient Instructions (Addendum)

## 2023-12-30 NOTE — Progress Notes (Signed)
 Remote pacemaker transmission.

## 2024-01-05 ENCOUNTER — Ambulatory Visit (INDEPENDENT_AMBULATORY_CARE_PROVIDER_SITE_OTHER): Admitting: Urology

## 2024-01-05 VITALS — BP 159/62 | HR 73 | Ht 67.0 in | Wt 180.0 lb

## 2024-01-05 DIAGNOSIS — N200 Calculus of kidney: Secondary | ICD-10-CM | POA: Diagnosis not present

## 2024-01-05 DIAGNOSIS — N401 Enlarged prostate with lower urinary tract symptoms: Secondary | ICD-10-CM

## 2024-01-05 DIAGNOSIS — N3281 Overactive bladder: Secondary | ICD-10-CM | POA: Diagnosis not present

## 2024-01-05 LAB — MICROSCOPIC EXAMINATION

## 2024-01-05 LAB — URINALYSIS, COMPLETE
Bilirubin, UA: NEGATIVE
Glucose, UA: NEGATIVE
Ketones, UA: NEGATIVE
Leukocytes,UA: NEGATIVE
Nitrite, UA: NEGATIVE
Protein,UA: NEGATIVE
RBC, UA: NEGATIVE
Specific Gravity, UA: 1.02 (ref 1.005–1.030)
Urobilinogen, Ur: 0.2 mg/dL (ref 0.2–1.0)
pH, UA: 6 (ref 5.0–7.5)
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
## 2024-01-06 ENCOUNTER — Encounter: Payer: Self-pay | Admitting: Urology

## 2024-01-07 ENCOUNTER — Ambulatory Visit: Payer: TRICARE For Life (TFL) | Admitting: Urology

## 2024-01-12 NOTE — Progress Notes (Unsigned)
  Electrophysiology Office Follow up Visit Note:    Date:  01/12/2024   ID:  Cameron [name], DOB [birth year], MRN [id number]  PCP:  [name], MD  [facility] Cardiologist:  Debby Sor, MD (Inactive)  [facility] Electrophysiologist:  OLE ONEIDA HOLTS, MD    Interval History:     Cameron [name] is a 83 y.o. male who presents for a follow up visit.   I last saw the patient December 25, 2022.  At the last appointment we discussed left atrial appendage occlusion.  We discussed his unstable weight and hemoglobin as factors that would make him a poor candidate for left atrial appendage occlusion.  At the last appointment we restarted Eliquis  5 mg by mouth twice daily.  He saw Dr. Sor in March of this year.  There were discussions during that appointment about left atrial appendage occlusion.  He is doing well today.  He is with his wife.  They are exercising 3 times a week by walking and doing weightlifting.  He feels well.  His body weight is stable.       Past medical, surgical, social and family history were reviewed.  ROS:   Please see the history of present illness.    All other systems reviewed and are negative.  EKGs/Labs/Other Studies Reviewed:    The following studies were reviewed today:  January 13, 2024 in-clinic device interrogation personally reviewed Battery and lead parameter stable No programming changes made today.        Physical Exam:    VS:  There were no vitals taken for this visit.    Wt Readings from Last 3 Encounters:  01/05/24 180 lb (81.6 kg)  12/03/23 183 lb 3.2 oz (83.1 kg)  11/06/23 188 lb (85.3 kg)     GEN: no distress CARD: RRR, No MRG RESP: No IWOB. CTAB.      ASSESSMENT:    No diagnosis found. PLAN:    In order of problems listed above:   #Atrial fibrillation #Severe anemia The patient has a history of atrial fibrillation and anemia that is complicated consistent use of anticoagulation.  In the past we  discussed left atrial appendage occlusion as a mechanism to avoid indefinite exposure to anticoagulation.  He has been taking his Eliquis  twice daily without missed doses.  Thankfully, his hemoglobin remains stable.  We had a long discussion today about stroke risk mitigation strategies including continuing anticoagulation and pursuing left atrial appendage occlusion.  Given his concomitant lung disease, I think is a marginal candidate for watchman.  After our discussion we decided to continue with anticoagulation alone and not pursue left atrial appendage occlusion.  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  Continue remote monitoring     Signed, OLE HOLTS, MD, [facility], [facility] 01/12/2024 1:20 PM    Electrophysiology [facility]

## 2024-01-13 ENCOUNTER — Encounter: Payer: Self-pay | Admitting: Cardiology

## 2024-01-13 ENCOUNTER — Ambulatory Visit: Attending: Cardiology | Admitting: Cardiology

## 2024-01-13 ENCOUNTER — Other Ambulatory Visit: Payer: Self-pay | Admitting: Medical Genetics

## 2024-01-13 VITALS — BP 151/75 | HR 64 | Ht 67.0 in | Wt 180.0 lb

## 2024-01-13 DIAGNOSIS — D649 Anemia, unspecified: Secondary | ICD-10-CM

## 2024-01-13 DIAGNOSIS — I4891 Unspecified atrial fibrillation: Secondary | ICD-10-CM

## 2024-01-13 DIAGNOSIS — Z950 Presence of cardiac pacemaker: Secondary | ICD-10-CM

## 2024-01-13 DIAGNOSIS — I442 Atrioventricular block, complete: Secondary | ICD-10-CM

## 2024-01-13 NOTE — Patient Instructions (Signed)

## 2024-01-14 ENCOUNTER — Encounter: Admitting: Dermatology

## 2024-01-18 ENCOUNTER — Encounter: Payer: Self-pay | Admitting: Dermatology

## 2024-01-18 ENCOUNTER — Telehealth: Payer: Self-pay | Admitting: Dermatology

## 2024-01-18 ENCOUNTER — Encounter: Admitting: Dermatology

## 2024-01-18 ENCOUNTER — Ambulatory Visit: Admitting: Dermatology

## 2024-01-18 DIAGNOSIS — W908XXA Exposure to other nonionizing radiation, initial encounter: Secondary | ICD-10-CM | POA: Diagnosis not present

## 2024-01-18 DIAGNOSIS — D229 Melanocytic nevi, unspecified: Secondary | ICD-10-CM

## 2024-01-18 DIAGNOSIS — L821 Other seborrheic keratosis: Secondary | ICD-10-CM | POA: Diagnosis not present

## 2024-01-18 DIAGNOSIS — L578 Other skin changes due to chronic exposure to nonionizing radiation: Secondary | ICD-10-CM | POA: Diagnosis not present

## 2024-01-18 DIAGNOSIS — Z1283 Encounter for screening for malignant neoplasm of skin: Secondary | ICD-10-CM | POA: Diagnosis not present

## 2024-01-18 DIAGNOSIS — D290 Benign neoplasm of penis: Secondary | ICD-10-CM

## 2024-01-18 DIAGNOSIS — L570 Actinic keratosis: Secondary | ICD-10-CM | POA: Diagnosis not present

## 2024-01-18 DIAGNOSIS — D692 Other nonthrombocytopenic purpura: Secondary | ICD-10-CM

## 2024-01-18 DIAGNOSIS — L814 Other melanin hyperpigmentation: Secondary | ICD-10-CM | POA: Diagnosis not present

## 2024-01-18 DIAGNOSIS — Z85828 Personal history of other malignant neoplasm of skin: Secondary | ICD-10-CM

## 2024-01-18 DIAGNOSIS — D239 Other benign neoplasm of skin, unspecified: Secondary | ICD-10-CM

## 2024-01-18 DIAGNOSIS — D1801 Hemangioma of skin and subcutaneous tissue: Secondary | ICD-10-CM

## 2024-01-18 DIAGNOSIS — L409 Psoriasis, unspecified: Secondary | ICD-10-CM

## 2024-01-18 NOTE — Progress Notes (Signed)
 Follow-Up Visit   Subjective  Cameron [name] is a 83 y.o. male who presents for the following: Skin Cancer Screening and Full Body Skin Exam  The patient presents for Total-Body Skin Exam (TBSE) for skin cancer screening and mole check. The patient has spots, moles and lesions to be evaluated, some may be new or changing and the patient may have concern these could be cancer.  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 Patient advises spot at penis has not changed. Also using OTC Lamisil  for tinea at buttocks.   The following portions of the chart were reviewed this encounter and updated as appropriate: medications, allergies, medical history  Review of Systems:  No other skin or systemic complaints except as noted in HPI or Assessment and Plan.  Objective  Well appearing patient in no apparent distress; mood and affect are within normal limits.  A full examination was performed including scalp, head, eyes, ears, nose, lips, neck, chest, axillae, abdomen, back, buttocks, bilateral upper extremities, bilateral lower extremities, hands, feet, fingers, toes, fingernails, and toenails. All findings within normal limits unless otherwise noted below.   Relevant physical exam findings are noted in the Assessment and Plan.  left dorsal hand Erythematous thin papules/macules with gritty scale.   Assessment & Plan   SKIN CANCER SCREENING PERFORMED TODAY.  ACTINIC DAMAGE - Chronic condition, secondary to cumulative UV/sun exposure - diffuse scaly erythematous macules with underlying dyspigmentation - Recommend daily broad spectrum sunscreen SPF 30+ to sun-exposed areas, reapply every 2 hours as needed.  - Staying in the shade or wearing long sleeves, sun glasses (UVA+UVB protection) and wide brim hats (4-inch brim around the entire circumference of the hat) are also recommended for sun protection.  - Call for new or changing lesions.  LENTIGINES, SEBORRHEIC KERATOSES,  HEMANGIOMAS - Benign normal skin lesions - Benign-appearing - Call for any changes  MELANOCYTIC NEVI - Tan-brown and/or pink-flesh-colored symmetric macules and papules - Benign appearing on exam today - Observation - Call clinic for new or changing moles - Recommend daily use of broad spectrum spf 30+ sunscreen to sun-exposed areas.   - 5.0 mm x 4.0 mm melanotic macule (darkest lesion). Peripheral lighter brown macules near the darkest one and on the left side x 2 at glands penis.  Stable compared to previous visit. Previously recommended biopsy but patient prefers observation.  Call clinic for new or changing lesion   HISTORY OF SQUAMOUS CELL CARCINOMA OF THE SKIN - No evidence of recurrence today - No lymphadenopathy - Recommend regular full body skin exams - Recommend daily broad spectrum sunscreen SPF 30+ to sun-exposed areas, reapply every 2 hours as needed.  - Call if any new or changing lesions are noted between office visits  PSORIASIS Exam: Well-demarcated erythematous papules/plaques with silvery scale, guttate pink scaly papules elbows. <1% BSA.  Chronic condition with duration or expected duration over one year. Currently well-controlled.  Psoriasis is a chronic non-curable, but treatable genetic/hereditary disease that may have other systemic features affecting other organ systems such as joints (Psoriatic Arthritis). It is associated with an increased risk of inflammatory bowel disease, heart disease, non-alcoholic fatty liver disease, and depression.  Treatments include light and laser treatments; topical medications; and systemic medications including oral and injectables.  Treatment Plan: Continue Vtama  once daily to right elbow prn for psoriasis.   DERMATOFIBROMA Exam: Firm pink/brown papulenodule with dimple sign. Treatment Plan: A dermatofibroma is a benign growth possibly related to trauma, such as an  insect bite, cut from shaving, or inflamed acne-type bump.   Treatment options to remove include shave or excision with resulting scar and risk of recurrence.  Since benign-appearing and not bothersome, will observe for now.   AK (ACTINIC KERATOSIS) left dorsal hand Actinic keratoses are precancerous spots that appear secondary to cumulative UV radiation exposure/sun exposure over time. They are chronic with expected duration over 1 year. A portion of actinic keratoses will progress to squamous cell carcinoma of the skin. It is not possible to reliably predict which spots will progress to skin cancer and so treatment is recommended to prevent development of skin cancer.  Recommend daily broad spectrum sunscreen SPF 30+ to sun-exposed areas, reapply every 2 hours as needed.  Recommend staying in the shade or wearing long sleeves, sun glasses (UVA+UVB protection) and wide brim hats (4-inch brim around the entire circumference of the hat). Call for new or changing lesions.  Will treat at next appt. MULTIPLE BENIGN NEVI   LENTIGINES   ACTINIC ELASTOSIS   SEBORRHEIC KERATOSES   CHERRY ANGIOMA   SOLAR PURPURA (HCC)   PSORIASIS   DERMATOFIBROMA   Return in about 6 months (around 07/20/2024) for TBSE, with Dr. Claudene, [facility], [place] follow up.  LILLETTE [name], RMA, am acting as scribe for Boneta Claudene, MD .   Documentation: I have reviewed the above documentation for accuracy and completeness, and I agree with the above.  Boneta Claudene, MD

## 2024-01-18 NOTE — Patient Instructions (Signed)
 Melanoma ABCDEs  Melanoma is the most dangerous type of skin cancer, and is the leading cause of death from skin disease.  You are more likely to develop melanoma if you: Have light-colored skin, light-colored eyes, or red or blond hair Spend a lot of time in the sun Tan regularly, either outdoors or in a tanning bed Have had blistering sunburns, especially during childhood Have a close family member who has had a melanoma Have atypical moles or large birthmarks  Early detection of melanoma is key since treatment is typically straightforward and cure rates are extremely high if we catch it early.   The first sign of melanoma is often a change in a mole or a new dark spot.  The ABCDE system is a way of remembering the signs of melanoma.  A for asymmetry:  The two halves do not match. B for border:  The edges of the growth are irregular. C for color:  A mixture of colors are present instead of an even brown color. D for diameter:  Melanomas are usually (but not always) greater than 6mm - the size of a pencil eraser. E for evolution:  The spot keeps changing in size, shape, and color.  Please check your skin once per month between visits. You can use a small mirror in front and a large mirror behind you to keep an eye on the back side or your body.   If you see any new or changing lesions before your next follow-up, please call to schedule a visit.  Please continue daily skin protection including broad spectrum sunscreen SPF 30+ to sun-exposed areas, reapplying every 2 hours as needed when you're outdoors.    Due to recent changes in healthcare laws, you may see results of your pathology and/or laboratory studies on MyChart before the doctors have had a chance to review them. We understand that in some cases there may be results that are confusing or concerning to you. Please understand that not all results are received at the same time and often the doctors may need to interpret multiple  results in order to provide you with the best plan of care or course of treatment. Therefore, we ask that you please give us  2 business days to thoroughly review all your results before contacting the office for clarification. Should we see a critical lab result, you will be contacted sooner.   If You Need Anything After Your Visit  If you have any questions or concerns for your doctor, please call our main line at [phone] and press option 4 to reach your doctor's medical assistant. If no one answers, please leave a voicemail as directed and we will return your call as soon as possible. Messages left after 4 pm will be answered the following business day.   You may also send us  a message via MyChart. We typically respond to MyChart messages within 1-2 business days.  For prescription refills, please ask your pharmacy to contact our office. Our fax number is [phone].  If you have an urgent issue when the clinic is closed that cannot wait until the next business day, you can page your doctor at the number below.    Please note that while we do our best to be available for urgent issues outside of office hours, we are not available 24/7.   If you have an urgent issue and are unable to reach us , you may choose to seek medical care at your doctor's office, retail clinic, urgent care  center, or emergency room.  If you have a medical emergency, please immediately call 911 or go to the emergency department.  Pager Numbers  - Dr. Hester: [phone]  - Dr. Jackquline: [phone]  - Dr. Claudene: [phone]   - Dr. Raymund: [phone]  In the event of inclement weather, please call our main line at [phone] for an update on the status of any delays or closures.  Dermatology Medication Tips: Please keep the boxes that topical medications come in in order to help keep track of the instructions about where and how to use these. Pharmacies typically print the medication instructions  only on the boxes and not directly on the medication tubes.   If your medication is too expensive, please contact our office at [phone] option 4 or send us  a message through MyChart.   We are unable to tell what your co-pay for medications will be in advance as this is different depending on your insurance coverage. However, we may be able to find a substitute medication at lower cost or fill out paperwork to get insurance to cover a needed medication.   If a prior authorization is required to get your medication covered by your insurance company, please allow us  1-2 business days to complete this process.  Drug prices often vary depending on where the prescription is filled and some pharmacies may offer cheaper prices.  The website www.goodrx.com contains coupons for medications through different pharmacies. The prices here do not account for what the cost may be with help from insurance (it may be cheaper with your insurance), but the website can give you the price if you did not use any insurance.  - You can print the associated coupon and take it with your prescription to the pharmacy.  - You may also stop by our office during regular business hours and pick up a GoodRx coupon card.  - If you need your prescription sent electronically to a different pharmacy, notify our office through [facility] or by phone at [phone] option 4.     Si Usted Necesita Algo Despus de Su Visita  Tambin puede enviarnos un mensaje a travs de Clinical cytogeneticist. Por lo general respondemos a los mensajes de MyChart en el transcurso de 1 a 2 das hbiles.  Para renovar recetas, por favor pida a su farmacia que se ponga en contacto con nuestra oficina. Randi lakes de fax es [place] [phone].  Si tiene un asunto urgente cuando la clnica est cerrada y que no puede esperar hasta el siguiente da hbil, puede llamar/localizar a su doctor(a) al nmero que aparece a continuacin.   Por favor, tenga en  cuenta que aunque hacemos todo lo posible para estar disponibles para asuntos urgentes fuera del horario de [place], no estamos disponibles las 24 horas del da, los 7 [address]  [address] de la [place].   Si tiene un problema urgente y no puede comunicarse con nosotros, puede optar por buscar atencin mdica  en el consultorio de su doctor(a), en una clnica privada, en un centro de atencin urgente o en una sala de emergencias.  Si tiene Engineer, drilling, por favor llame inmediatamente al 911 o vaya a la sala de emergencias.  Nmeros de bper  - Dr. Hester: [phone]  - Dra. Jackquline: [phone]  - Dr. Claudene: [phone]  - Dra. Kitts: [phone]  En caso de inclemencias del [place], por favor llame a nuestra lnea principal al [phone] para una actualizacin sobre el estado de cualquier retraso o cierre.  Consejos para  la medicacin en dermatologa: Por favor, guarde las cajas en las que vienen los medicamentos de uso tpico para ayudarle a seguir las instrucciones sobre dnde y cmo usarlos. Las farmacias generalmente imprimen las instrucciones del medicamento slo en las cajas y no directamente en los tubos del [place].   Si su medicamento es muy caro, por favor, pngase en contacto con landry rieger llamando al [phone] y presione la opcin 4 o envenos un mensaje a travs de Clinical cytogeneticist.   No podemos decirle cul ser su copago por los medicamentos por adelantado ya que esto es diferente dependiendo de la cobertura de su seguro. Sin embargo, es posible que podamos encontrar un medicamento sustituto a Audiological scientist un formulario para que el seguro cubra el medicamento que se considera necesario.   Si se requiere una autorizacin previa para que su compaa de seguros malta su medicamento, por favor permtanos de 1 a 2 das hbiles para completar este proceso.  Los precios de los medicamentos varan con frecuencia dependiendo del Environmental consultant de dnde se surte la receta y alguna  farmacias pueden ofrecer precios ms baratos.  El sitio web www.goodrx.com tiene cupones para medicamentos de Health and safety inspector. Los precios aqu no tienen en cuenta lo que podra costar con la ayuda del seguro (puede ser ms barato con su seguro), pero el sitio web puede darle el precio si no utiliz Tourist information centre manager.  - Puede imprimir el cupn correspondiente y llevarlo con su receta a la farmacia.  - Tambin puede pasar por nuestra oficina durante el horario de atencin regular y Education officer, museum una tarjeta de cupones de GoodRx.  - Si necesita que su receta se enve electrnicamente a una farmacia diferente, informe a nuestra oficina a travs de MyChart de [place] o por telfono llamando al [phone] y presione la opcin 4.

## 2024-01-21 ENCOUNTER — Encounter: Payer: Medicare Other | Admitting: Dermatology

## 2024-01-26 ENCOUNTER — Encounter (HOSPITAL_BASED_OUTPATIENT_CLINIC_OR_DEPARTMENT_OTHER): Payer: Self-pay

## 2024-01-28 ENCOUNTER — Other Ambulatory Visit (HOSPITAL_COMMUNITY): Payer: Self-pay

## 2024-01-28 ENCOUNTER — Ambulatory Visit: Attending: Cardiology | Admitting: Cardiology

## 2024-01-28 ENCOUNTER — Encounter: Payer: Self-pay | Admitting: Cardiology

## 2024-01-28 ENCOUNTER — Encounter: Payer: Self-pay | Admitting: Oncology

## 2024-01-28 VITALS — BP 140/62 | HR 59 | Resp 16 | Ht 67.0 in | Wt 187.0 lb

## 2024-01-28 DIAGNOSIS — I2729 Other secondary pulmonary hypertension: Secondary | ICD-10-CM | POA: Diagnosis not present

## 2024-01-28 DIAGNOSIS — I2510 Atherosclerotic heart disease of native coronary artery without angina pectoris: Secondary | ICD-10-CM | POA: Diagnosis not present

## 2024-01-28 DIAGNOSIS — I480 Paroxysmal atrial fibrillation: Secondary | ICD-10-CM

## 2024-01-28 DIAGNOSIS — E038 Other specified hypothyroidism: Secondary | ICD-10-CM

## 2024-01-28 DIAGNOSIS — R0609 Other forms of dyspnea: Secondary | ICD-10-CM | POA: Diagnosis not present

## 2024-01-28 MED ORDER — NEBIVOLOL HCL 10 MG PO TABS: 10.0000 mg | ORAL_TABLET | Freq: Every day | ORAL | 3 refills | Status: DC

## 2024-01-28 MED ORDER — TELMISARTAN 40 MG PO TABS
40.0000 mg | ORAL_TABLET | Freq: Every day | ORAL | 3 refills | Status: AC
  Filled 2024-01-28 (×2): qty 90, 90d supply, fill #0
  Filled 2024-01-28: qty 30, 30d supply, fill #0
  Filled 2024-05-05 – 2024-05-09 (×2): qty 90, 90d supply, fill #1
  Filled 2024-05-14: qty 90, 90d supply, fill #2

## 2024-01-28 MED ORDER — NEBIVOLOL HCL 10 MG PO TABS
10.0000 mg | ORAL_TABLET | Freq: Every day | ORAL | 3 refills | Status: AC
  Filled 2024-01-28 (×2): qty 90, 90d supply, fill #0
  Filled 2024-01-28: qty 30, 30d supply, fill #0
  Filled 2024-05-14 – 2024-06-28 (×2): qty 90, 90d supply, fill #1

## 2024-01-28 MED ORDER — DILTIAZEM HCL ER COATED BEADS 240 MG PO CP24: 240.0000 mg | ORAL_CAPSULE | Freq: Every day | ORAL | 3 refills | Status: DC

## 2024-01-28 MED ORDER — DILTIAZEM HCL ER COATED BEADS 240 MG PO CP24
240.0000 mg | ORAL_CAPSULE | Freq: Every day | ORAL | 3 refills | Status: DC
  Filled 2024-01-28: qty 90, 90d supply, fill #0
  Filled 2024-01-28: qty 30, 30d supply, fill #0

## 2024-01-28 MED ORDER — TELMISARTAN 40 MG PO TABS: 40.0000 mg | ORAL_TABLET | Freq: Every day | ORAL | 3 refills | Status: DC

## 2024-01-28 NOTE — Progress Notes (Signed)
 Cardiology Office Note:  .   Date:  01/28/2024  ID:  Cameron [name], DOB 07-25-1940, MRN [id number] PCP: [name], MD  [facility] Providers Cardiologist:  Gordy Bergamo, MD Cardiology APP:  Madie Jon Garre, PA  Electrophysiologist:  OLE ONEIDA HOLTS, MD  Sleep Medicine:  Wilbert Bihari, MD   History of Present Illness: .   Cameron [name] is a 83 y.o. 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   Patient is new to me and has complex medical issues as noted above.  Cardiac Studies relevent.    Lower extremity arterial duplex 12/10/2023: Diffuse bilateral biphasic waveforms above-the-knee, monophasic waveforms below the knee suggest diffuse vascular disease.  There is <50% stenosis in the left mid SFA. Right ABI 1.5 and left ABI noncompressible, indeterminate.  ECHOCARDIOGRAM COMPLETE 07/28/2023  1. Left ventricular ejection fraction, by estimation, is 60 to 65%. Left ventricular ejection fraction by 3D volume is 64 %. The left ventricle has normal function. The left ventricle has no regional wall motion abnormalities. Left ventricular diastolic parameters are consistent with Grade III diastolic dysfunction (restrictive). Elevated left atrial pressure. The E/e' is 22. The average left ventricular global longitudinal strain is -13.0 %. The global longitudinal strain is abnormal. 2. Right ventricular systolic function is low normal. The right ventricular size is mildly enlarged. There is severely elevated pulmonary artery systolic pressure. The estimated right ventricular  systolic pressure is 68.5 mmHg. 3. Left atrial size was severely dilated. 4. Tricuspid valve regurgitation is moderate. CVP 8 mm Hg.   CARDIAC CATHETERIZATION 08/11/2017  2.515 mm Resolute DES stent into the OM1  3.018 mm Resolute DES stent in Prox to Mid Cx     Discussed the use of AI scribe software for clinical note transcription with the patient, who gave verbal consent to proceed.  History of Present Illness Cameron [name] is an 83 year old male with atrial fibrillation, complete heart block, and pulmonary fibrosis who presents with shortness of breath.  He experiences significant shortness of breath, worsening with physical activities such as walking uphill. He is not on medication for idiopathic pulmonary fibrosis due to a previous adverse reaction. Despite this, he exercises three times a week.  Atrial fibrillation and complete heart block are managed with a pacemaker placed in 2013 and a generator change in 2022. Two stents were placed in the circumflex coronary artery in 2019. Current medications include telmisartan  80 mg once daily, spironolactone  12.5 mg, and diltiazem  120 mg.  He has sleep apnea managed with a CPAP machine, diabetes, and stage 3B chronic kidney disease, leading to episodes of elevated potassium levels managed with dietary modifications.  Recent weight gain over the past two to three months is attributed to a cruise. He previously lost weight to 167 pounds but has since gained. Blood pressure at home typically ranges in the 130s to 140s. No leg cramps during walking. He has a history of smoking but quit years ago.   Labs    Recent Labs    07/21/23 1506 07/22/23 0450 08/25/23 2058 11/06/23 1215  NA 137 138 138 141  K 5.2* 4.6  5.3* 5.2  CL 107 110 104 104  CO2 23 24 22 21   GLUCOSE 90 110* 52* 125*  BUN 57* 42* 52* 27  CREATININE 1.83* 1.19 1.58* 1.05  CALCIUM  9.6 9.3 10.4* 10.1  GFRNONAA 36* >60 43*  --     Lab Results  Component Value  Date   ALT 23 11/06/2023   AST 25 11/06/2023   ALKPHOS 125 (H) 11/06/2023   BILITOT 0.4 11/06/2023      Latest Ref Rng & Units 11/06/2023   12:15 PM 09/28/2023   10:50 AM 08/25/2023    8:58 PM  CBC  WBC 3.4 - 10.8 x10E3/uL 8.1  7.9  8.8   Hemoglobin 13.0 - 17.7 g/dL 87.1  88.1  85.9   Hematocrit 37.5 - 51.0 % 40.8  36.3  41.6   Platelets 150 - 450 x10E3/uL 190  183  204    No results found for: HGBA1C  Lab Results  Component Value Date   TSH 8.940 (H) 11/06/2023    Care everywhere/Faxed External Labs:  Labs 11/10/2023:  Total cholesterol 100, triglycerides 51, HDL 39, LDL 50.  ROS  Review of Systems  Cardiovascular:  Positive for dyspnea on exertion (chronic). Negative for chest pain and leg swelling.   Physical Exam:   VS:  BP (!) 140/62 (BP Location: Left Arm, Patient Position: Sitting, Cuff Size: Normal)   Pulse (!) 59   Resp 16   Ht 5' 7 (1.702 m)   Wt 187 lb (84.8 kg)   SpO2 97%   BMI 29.29 kg/m    Wt Readings from Last 3 Encounters:  01/28/24 187 lb (84.8 kg)  01/13/24 180 lb (81.6 kg)  01/05/24 180 lb (81.6 kg)    BP Readings from Last 3 Encounters:  01/28/24 (!) 140/62  01/13/24 (!) 151/75  01/05/24 (!) 159/62   Physical Exam Neck:     Vascular: No carotid bruit or JVD.  Cardiovascular:     Rate and Rhythm: Normal rate and regular rhythm.     Pulses: Intact distal pulses.          Popliteal pulses are 1+ on the right side and 2+ on the left side.       Dorsalis pedis pulses are 0 on the right side and 0 on the left side.       Posterior tibial pulses are 0 on the right side and 0 on the left side.     Heart sounds: Normal heart sounds. No murmur heard.    No gallop.  Pulmonary:     Effort: Pulmonary effort is normal.     Breath sounds: Normal breath sounds.  Abdominal:     General: Bowel sounds are normal.     Palpations: Abdomen is soft.  Musculoskeletal:     Right lower leg: No edema.     Left lower leg: No edema.    EKG:          ASSESSMENT AND PLAN: .      ICD-10-CM   1. Coronary artery disease involving native coronary artery of native heart without angina pectoris  I25.10     2. PAF (paroxysmal atrial fibrillation) (HCC)  I48.0     3. Dyspnea on exertion  R06.09 Basic metabolic panel with GFR    Brain natriuretic peptide    TSH    T3, free    T4, free    4. Other secondary pulmonary hypertension (HCC)  I27.29 diltiazem  (CARDIZEM  CD) 240 MG 24 hr  capsule    telmisartan  (MICARDIS ) 40 MG tablet    nebivolol  (BYSTOLIC ) 10 MG tablet    DISCONTINUED: nebivolol  (BYSTOLIC ) 10 MG tablet    DISCONTINUED: telmisartan  (MICARDIS ) 40 MG tablet    DISCONTINUED: diltiazem  (CARDIZEM  CD) 240 MG 24 hr capsule    5. Other specified hypothyroidism  E03.8      Assessment & Plan Idiopathic pulmonary fibrosis/dyspnea on exertion with secondary pulmonary hypertension probably group 2 and 3 related to elevated EDP and underlying IPF Idiopathic pulmonary fibrosis is stable, but secondary pulmonary hypertension is concerning with recent echocardiogram showing elevated pulmonary artery pressure at 68 mmHg. Shortness of breath is present, and oxygen levels need monitoring during exertion.  Pulmonary hypertension may worsen if oxygen levels drop during exertion, potentially leading to heart failure. - Monitor oxygen levels during exertion - Continue to follow-up with Dr. Geronimo for further evaluation and potential six-minute walk test - Discuss potential use of sildenafil (Viagra) 20 mg 3 times daily for pulmonary hypertension with Dr. Geronimo - Check BNP - Consider right heart catheterization if indicated - Echocardiogram reviewed, elevated EDP/LA pressure noted otherwise normal LV systolic function.  Paroxysmal atrial fibrillation and complete heart block with pacemaker Atrial fibrillation is well-managed. Complete heart block is managed with a pacemaker, initially placed in 2013 with a generator change in 2022. Pacemaker  data shows 96% pacing in the upper chamber. - A-fib burden on pacemaker interrogation very low at around <2%  Atherosclerotic heart disease of native coronary artery Coronary artery disease with two stents placed in the circumflex coronary artery in 2019. No current symptoms reported. Appropriate management presently on low-dose pravastatin  with excellent control of lipids, no changes indicated.  Hypertension with hyperkalemia Hypertension is not well-controlled with recent readings above target. Hyperkalemia is a concern, possibly related to current medications. Current regimen includes telmisartan   80 mg and spironolactone  to be continued at 12.5 mg daily, both of which can increase potassium levels. Adjustments to medication are necessary to achieve target blood pressure and manage potassium levels. 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 Potassium levels have been elevated in the past, requiring monitoring. - Order basic metabolic panel (BMP) to monitor kidney function and potassium levels - Consider SGLT2 inhibitors which may help both with blood pressure control and renal preservation.  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 No symptoms of claudication reported. Presently on Eliquis  for paroxysmal atrial fibrillation, no indication for antiplatelet regimen.  Follow-Up - Schedule follow-up appointment in 3 months - Send note to Dr. Tonna regarding pulmonary hypertension and potential treatment options - Perform blood work today including BMP and thyroid  function test due to abnormal TSH and forwarded to  PCP.   Follow up: 3 months  Signed,  Gordy Bergamo, MD, [facility] 01/28/2024, 4:29 PM [facility] [address] [place], [place] [address] Phone: [phone]. Fax:  [phone]

## 2024-01-28 NOTE — Patient Instructions (Signed)
 Medication Instructions:  INCREASE Diltiazem  to 240 mg daily  DECREASE telmisartan  to 40 mg daily   INCREASE Bystolic  to 10 mg daily  *If you need a refill on your cardiac medications before your next appointment, please call your pharmacy*  Lab Work TODAY: BMP  BNP  TSH T3 T4 If you have labs (blood work) drawn today and your tests are completely normal, you will receive your results only by: MyChart Message (if you have MyChart) OR A paper copy in the mail If you have any lab test that is abnormal or we need to change your treatment, we will call you to review the results.  Testing/Procedures: NONE  Follow-Up: At [facility], you and your health needs are our priority.  As part of our continuing mission to provide you with exceptional heart care, our providers are all part of one team.  This team includes your primary Cardiologist (physician) and Advanced Practice Providers or APPs (Physician Assistants and Nurse Practitioners) who all work together to provide you with the care you need, when you need it.  Your next appointment:   3 Months   Provider:   Gordy Bergamo, MD    We recommend signing up for the patient portal called MyChart.  Sign up information is provided on this After Visit Summary.  MyChart is used to connect with patients for Virtual Visits (Telemedicine).  Patients are able to view lab/test results, encounter notes, upcoming appointments, etc.  Non-urgent messages can be sent to your provider as well.   To learn more about what you can do with MyChart, go to [email or web address].

## 2024-01-29 ENCOUNTER — Ambulatory Visit: Payer: Self-pay | Admitting: Cardiology

## 2024-01-29 LAB — BASIC METABOLIC PANEL WITH GFR
BUN/Creatinine Ratio: 22 (ref 10–24)
BUN: 23 mg/dL (ref 8–27)
CO2: 22 mmol/L (ref 20–29)
Calcium: 9.9 mg/dL (ref 8.6–10.2)
Chloride: 106 mmol/L (ref 96–106)
Creatinine, Ser: 1.03 mg/dL (ref 0.76–1.27)
Glucose: 115 mg/dL — ABNORMAL HIGH (ref 70–99)
Potassium: 4.7 mmol/L (ref 3.5–5.2)
Sodium: 142 mmol/L (ref 134–144)
eGFR: 72 mL/min/1.73 (ref >59)

## 2024-01-29 LAB — T4, FREE: Free T4: 0.79 ng/dL — ABNORMAL LOW (ref 0.82–1.77)

## 2024-01-29 LAB — BRAIN NATRIURETIC PEPTIDE: BNP: 166.4 pg/mL — ABNORMAL HIGH (ref 0.0–100.0)

## 2024-01-29 LAB — T3, FREE: T3, Free: 3.4 pg/mL (ref 2.0–4.4)

## 2024-01-29 LAB — TSH: TSH: 3.81 u[IU]/mL (ref 0.450–4.500)

## 2024-02-02 ENCOUNTER — Other Ambulatory Visit

## 2024-02-03 ENCOUNTER — Ambulatory Visit: Admitting: Oncology

## 2024-02-04 ENCOUNTER — Ambulatory Visit (INDEPENDENT_AMBULATORY_CARE_PROVIDER_SITE_OTHER): Payer: Medicare Other

## 2024-02-04 DIAGNOSIS — I495 Sick sinus syndrome: Secondary | ICD-10-CM

## 2024-02-08 ENCOUNTER — Other Ambulatory Visit: Payer: Self-pay | Admitting: *Deleted

## 2024-02-08 DIAGNOSIS — D509 Iron deficiency anemia, unspecified: Secondary | ICD-10-CM

## 2024-02-08 LAB — CUP PACEART REMOTE DEVICE CHECK
Battery Remaining Longevity: 66 mo
Battery Remaining Percentage: 60 %
Battery Voltage: 3.01 V
Brady Statistic AP VP Percent: 91 %
Brady Statistic AP VS Percent: 1 %
Brady Statistic AS VP Percent: 9.3 %
Brady Statistic AS VS Percent: 1 %
Brady Statistic RA Percent Paced: 90 %
Brady Statistic RV Percent Paced: 99 %
Date Time Interrogation Session: 20250912234249
Implantable Lead Connection Status: 753985
Implantable Lead Connection Status: 753985
Implantable Lead Implant Date: 20130716
Implantable Lead Implant Date: 20220317
Implantable Lead Location: 753859
Implantable Lead Location: 753860
Implantable Lead Model: 1948
Implantable Lead Model: 5076
Implantable Pulse Generator Implant Date: 20220317
Lead Channel Impedance Value: 330 Ohm
Lead Channel Impedance Value: 450 Ohm
Lead Channel Pacing Threshold Amplitude: 0.5 V
Lead Channel Pacing Threshold Amplitude: 0.75 V
Lead Channel Pacing Threshold Pulse Width: 0.5 ms
Lead Channel Pacing Threshold Pulse Width: 0.5 ms
Lead Channel Sensing Intrinsic Amplitude: 1.8 mV
Lead Channel Sensing Intrinsic Amplitude: 12 mV
Lead Channel Setting Pacing Amplitude: 1 V
Lead Channel Setting Pacing Amplitude: 1.5 V
Lead Channel Setting Pacing Pulse Width: 0.5 ms
Lead Channel Setting Sensing Sensitivity: 7 mV
Pulse Gen Model: 2272
Pulse Gen Serial Number: 3908614

## 2024-02-09 ENCOUNTER — Inpatient Hospital Stay: Attending: Oncology

## 2024-02-09 DIAGNOSIS — D509 Iron deficiency anemia, unspecified: Secondary | ICD-10-CM | POA: Diagnosis present

## 2024-02-09 LAB — CBC WITH DIFFERENTIAL/PLATELET
Abs Immature Granulocytes: 0.04 K/uL (ref 0.00–0.07)
Basophils Absolute: 0.1 K/uL (ref 0.0–0.1)
Basophils Relative: 1 %
Eosinophils Absolute: 0.4 K/uL (ref 0.0–0.5)
Eosinophils Relative: 5 %
HCT: 32.5 % — ABNORMAL LOW (ref 39.0–52.0)
Hemoglobin: 10.5 g/dL — ABNORMAL LOW (ref 13.0–17.0)
Immature Granulocytes: 1 %
Lymphocytes Relative: 22 %
Lymphs Abs: 1.8 K/uL (ref 0.7–4.0)
MCH: 31.6 pg (ref 26.0–34.0)
MCHC: 32.3 g/dL (ref 30.0–36.0)
MCV: 97.9 fL (ref 80.0–100.0)
Monocytes Absolute: 0.7 K/uL (ref 0.1–1.0)
Monocytes Relative: 9 %
Neutro Abs: 5.1 K/uL (ref 1.7–7.7)
Neutrophils Relative %: 62 %
Platelets: 178 K/uL (ref 150–400)
RBC: 3.32 MIL/uL — ABNORMAL LOW (ref 4.22–5.81)
RDW: 14.8 % (ref 11.5–15.5)
WBC: 8.2 K/uL (ref 4.0–10.5)
nRBC: 0 % (ref 0.0–0.2)

## 2024-02-09 LAB — IRON AND TIBC
Iron: 87 ug/dL (ref 45–182)
Saturation Ratios: 27 % (ref 17.9–39.5)
TIBC: 325 ug/dL (ref 250–450)
UIBC: 238 ug/dL

## 2024-02-09 LAB — FERRITIN: Ferritin: 467 ng/mL — ABNORMAL HIGH (ref 24–336)

## 2024-02-10 ENCOUNTER — Inpatient Hospital Stay (HOSPITAL_BASED_OUTPATIENT_CLINIC_OR_DEPARTMENT_OTHER): Admitting: Oncology

## 2024-02-10 ENCOUNTER — Encounter: Payer: Self-pay | Admitting: Oncology

## 2024-02-10 ENCOUNTER — Other Ambulatory Visit
Admission: RE | Admit: 2024-02-10 | Discharge: 2024-02-10 | Disposition: A | Discharge status: Home / Self Care | Payer: Self-pay | Source: Ambulatory Visit | Attending: Medical Genetics | Admitting: Medical Genetics

## 2024-02-10 VITALS — BP 147/54 | HR 66 | Temp 97.6°F | Resp 16 | Wt 185.0 lb

## 2024-02-10 DIAGNOSIS — D509 Iron deficiency anemia, unspecified: Secondary | ICD-10-CM
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
## 2024-02-11 ENCOUNTER — Ambulatory Visit: Payer: Self-pay | Admitting: Cardiology

## 2024-02-12 NOTE — Progress Notes (Signed)
 Remote PPM Transmission

## 2024-02-19 LAB — GENECONNECT MOLECULAR SCREEN: Genetic Analysis Overall Interpretation: NEGATIVE

## 2024-02-29 ENCOUNTER — Telehealth: Payer: Self-pay | Admitting: Cardiology

## 2024-02-29 MED ORDER — SPIRONOLACTONE 25 MG PO TABS: 12.5000 mg | ORAL_TABLET | Freq: Every day | ORAL | 3 refills | Status: AC

## 2024-02-29 NOTE — Telephone Encounter (Signed)
 Pt's medication was sent to pt's pharmacy as requested. Confirmation received.

## 2024-02-29 NOTE — Telephone Encounter (Signed)
*  STAT* If patient is at the pharmacy, call can be transferred to refill team.   1. Which medications need to be refilled? (please list name of each medication and dose if known)  spironolactone (ALDACTONE) 25 MG tablet  2. Which pharmacy/location (including street and city if local pharmacy) is medication to be sent to? [facility] [address], [place]  3. Do they need a 30 day or 90 day supply?  90 day supply

## 2024-03-03 ENCOUNTER — Encounter

## 2024-04-02 ENCOUNTER — Other Ambulatory Visit: Payer: Self-pay | Admitting: Urology

## 2024-04-14 ENCOUNTER — Other Ambulatory Visit: Payer: Self-pay | Admitting: Cardiology

## 2024-04-15 MED ORDER — NITROGLYCERIN 0.4 MG SL SUBL: 0.4000 mg | SUBLINGUAL_TABLET | SUBLINGUAL | 3 refills | Status: AC | PRN

## 2024-05-04 ENCOUNTER — Encounter: Payer: Self-pay | Admitting: Cardiology

## 2024-05-04 ENCOUNTER — Ambulatory Visit: Attending: Cardiology | Admitting: Cardiology

## 2024-05-04 ENCOUNTER — Other Ambulatory Visit (HOSPITAL_COMMUNITY): Payer: Self-pay

## 2024-05-04 VITALS — BP 120/50 | HR 64 | Resp 16 | Ht 67.0 in | Wt 184.0 lb

## 2024-05-04 DIAGNOSIS — I2723 Pulmonary hypertension due to lung diseases and hypoxia: Secondary | ICD-10-CM

## 2024-05-04 DIAGNOSIS — R0609 Other forms of dyspnea: Secondary | ICD-10-CM

## 2024-05-04 DIAGNOSIS — I5032 Chronic diastolic (congestive) heart failure: Secondary | ICD-10-CM | POA: Diagnosis not present

## 2024-05-04 DIAGNOSIS — E1122 Type 2 diabetes mellitus with diabetic chronic kidney disease: Secondary | ICD-10-CM

## 2024-05-04 DIAGNOSIS — N1832 Chronic kidney disease, stage 3b: Secondary | ICD-10-CM

## 2024-05-04 DIAGNOSIS — I2722 Pulmonary hypertension due to left heart disease: Secondary | ICD-10-CM

## 2024-05-04 MED ORDER — EMPAGLIFLOZIN 10 MG PO TABS
10.0000 mg | ORAL_TABLET | Freq: Every day | ORAL | 2 refills | Status: AC
  Filled 2024-05-04 (×3): qty 30, 30d supply, fill #0
  Filled 2024-05-20 – 2024-05-27 (×3): qty 30, 30d supply, fill #1

## 2024-05-04 NOTE — Progress Notes (Signed)
 Cardiology Office Note:  .   Date:  05/04/2024  ID:  Cameron [name], DOB Apr 28, 1941, MRN [id number] PCP: [name], MD  [facility] Providers Cardiologist:  Gordy Bergamo, MD Cardiology APP:  Madie Jon Garre, PA  Electrophysiologist:  OLE ONEIDA HOLTS, MD  Sleep Medicine:  Wilbert Bihari, MD   History of Present Illness: .   Cameron [name] is a 83 y.o. 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  complete heart block SP Saint Jude generator change in 2022 (original placement in 2013), CAD with Cx and OM-1 stents on 08/11/2017, has low risk nuclear stress test on 02/24/2023 with apical defect probably related to attenuation artifact versus paced rhythm and mildly reduced LVEF at 46%, Echocardiogram on 07/28/2023 revealing normal LV and RV function however the RV was mildly dilated with moderate to severe pulm hypertension.He presents for follow-up of dyspnea.  He has bilateral renal calculi and microhematuria.  He is accompanied by his wife, states that he is doing well and except for chronic dyspnea remains stable with very mild right leg edema that also has remained stable over the years.    Discussed the use of AI scribe software for clinical note transcription with the patient, who gave verbal consent to proceed.  History of Present Illness Cameron [name] is an 83 year old male with chronic diastolic heart failure and pulmonary hypertension who presents for follow-up of his cardiovascular conditions.  His shortness of breath is stable and has improved. He has right knee swelling that is stable and managed with compression socks, without other new edema.  He had a coronary stent placed in March 2019. A  stress test in October 2024 showed no ischemia but mildly reduced heart function attributed to pacemaker rhythm. An echocardiogram in March 2025 was done to reassess heart function.  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  He has stage 3A-B chronic kidney disease and diabetes. His medications include hydralazine  25 mg twice daily, though he is unsure about consistent use. His blood pressure is well controlled.  He walks three days a week, including uphill and downhill, without change in his exercise tolerance.   Cardiac Studies relevent.    MYOCARDIAL PERFUSION IMAGING 02/24/2023    Left ventricular function is abnormal. Global function is mildly reduced. Nuclear stress EF: 46%. The left ventricular ejection fraction is mildly decreased (45-54%). End diastolic cavity size is normal.  ECHOCARDIOGRAM COMPLETE 07/28/2023  1. Left ventricular ejection fraction, by estimation, is 60 to 65%. Left ventricular ejection fraction by 3D volume is 64 %. The left ventricle has normal function. The left ventricle has no regional wall motion abnormalities. Left ventricular diastolic parameters are consistent with Grade III diastolic dysfunction (restrictive). Elevated left atrial pressure. The E/e' is 22. The average left ventricular global longitudinal strain is -13.0 %. The global longitudinal strain is abnormal. 2. Right ventricular systolic function is low normal. The right ventricular size is mildly enlarged. There is severely elevated pulmonary artery systolic pressure. The estimated right ventricular systolic pressure is 68.5 mmHg. 3. Left atrial size was severely dilated. 4. Tricuspid valve regurgitation is moderate. CVP 8 mm Hg.    CARDIAC CATHETERIZATION 08/11/2017  2.515 mm Resolute DES stent into the OM1  3.018 mm Resolute DES  stent in Prox to Mid Cx  Labs   Recent Labs    07/21/23 1506 07/22/23 0450 08/25/23 2058 11/06/23 1215 01/28/24 1051  NA 137 138 138  141 142  K 5.2* 4.6 5.3* 5.2 4.7  CL 107 110 104 104 106  CO2 23 24 22 21 22   GLUCOSE 90 110* 52* 125* 115*  BUN 57* 42* 52* 27 23  CREATININE 1.83* 1.19 1.58* 1.05 1.03  CALCIUM  9.6 9.3 10.4* 10.1 9.9  GFRNONAA 36* >60 43*  --   --     Lab Results  Component Value Date   ALT 23 11/06/2023   AST 25 11/06/2023   ALKPHOS 125 (H) 11/06/2023   BILITOT 0.4 11/06/2023      Latest Ref Rng & Units 02/09/2024   10:52 AM 11/06/2023   12:15 PM 09/28/2023   10:50 AM  CBC  WBC 4.0 - 10.5 K/uL 8.2  8.1  7.9   Hemoglobin 13.0 - 17.0 g/dL 89.4  87.1  88.1   Hematocrit 39.0 - 52.0 % 32.5  40.8  36.3   Platelets 150 - 400 K/uL 178  190  183    No results found for: HGBA1C  Lab Results  Component Value Date   TSH 3.810 01/28/2024    BNP (last 3 results) Recent Labs    01/28/24 1051  BNP 166.4*   Care everywhere/Faxed External Labs:  Labs 11/10/2023:   Total cholesterol 100, triglycerides 51, HDL 39, LDL 50  ROS  Review of Systems  Cardiovascular:  Positive for dyspnea on exertion (stable over the years) and leg swelling (right leg stable and chronic and mild). Negative for chest pain.   Physical Exam:   VS:  BP (!) 120/50 (BP Location: Left Arm, Patient Position: Sitting, Cuff Size: Normal)   Pulse 64   Resp 16   Ht 5' 7 (1.702 m)   Wt 184 lb (83.5 kg)   SpO2 98%   BMI 28.82 kg/m    Wt Readings from Last 3 Encounters:  05/04/24 184 lb (83.5 kg)  02/10/24 185 lb (83.9 kg)  01/28/24 187 lb (84.8 kg)    BP Readings from Last 3 Encounters:  05/04/24 (!) 120/50  02/10/24 (!) 147/54  01/28/24 (!) 140/62   Physical Exam EKG:         ASSESSMENT AND PLAN: .      ICD-10-CM   1. Chronic diastolic heart failure (HCC)  P49.67 empagliflozin  (JARDIANCE ) 10 MG TABS tablet    Brain natriuretic peptide    Basic metabolic panel with GFR        2. Dyspnea on exertion  R06.09 Brain natriuretic peptide    Basic metabolic panel with GFR        3. WHO group 2 pulmonary  arterial hypertension (HCC)  I27.22 Brain natriuretic peptide    Basic metabolic panel with GFR        4. WHO group 3 pulmonary arterial hypertension (HCC)  I27.23 Brain natriuretic peptide    Basic metabolic panel with GFR        5. Type 2 diabetes mellitus with stage 3b chronic kidney disease, without long-term current use of insulin  (HCC)  E11.22 empagliflozin  (JARDIANCE ) 10 MG TABS tablet   N18.32 Brain natriuretic peptide    Basic metabolic panel with GFR         Assessment & Plan Chronic diastolic heart failure Stage 3 diastolic dysfunction with normal heart function on recent echocardiogram. Shortness of breath likely due to restrictive  cardiac disease and pulmonary hypertension. Jardiance  considered for its dual benefit in heart failure and kidney protection. 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 Elevated right heart pressures due to scar tissue in the lungs and hypertension. Jardiance  may help reduce pressures and improve symptoms. - Monitor for worsening shortness of breath - Will consider right heart catheterization if symptoms worsen  Type 2 diabetes mellitus with stage 3b chronic kidney disease Diabetes with stage 3b chronic kidney disease. Jardiance  chosen for its benefits in diabetes management and kidney protection. - Initiated Jardiance  10 mg once daily - Will monitor kidney function with BMP in 2-3 weeks - Educated on potential side effects and hygiene practices - Echocardiogram on 07/28/2023 revealing normal LV and RV function however the RV was mildly dilated with moderate to severe pulm hypertension  Follow up: 3 Months  for chronic diastolic heart failure, Pulmonary hypertension and dyspnea  Signed,  Gordy Bergamo, MD, [facility] 05/04/2024, 2:19 PM [facility] [address] [place], [place] [address] Phone: [phone]. Fax:  [phone]

## 2024-05-04 NOTE — Patient Instructions (Addendum)
 Medication Instructions:  START JARDIANCE  10mg  Take 1 tablet (10 mg total) by mouth daily.,  *If you need a refill on your cardiac medications before your next appointment, please call your pharmacy*   Lab Work: BMP & BNP  2-3 weeks ( between 05/18/25 - 05/25/25)  If you have labs (blood work) drawn today and your tests are completely normal, you will receive your results only by: MyChart Message (if you have MyChart) OR A paper copy in the mail If you have any lab test that is abnormal or we need to change your treatment, we will call you to review the results.   Follow-Up: At [facility], you and your health needs are our priority.  As part of our continuing mission to provide you with exceptional heart care, our providers are all part of one team.  This team includes your primary Cardiologist (physician) and Advanced Practice Providers or APPs (Physician Assistants and Nurse Practitioners) who all work together to provide you with the care you need, when you need it.  Your next appointment:   3 month(s) on Tuesday, August 02, 2024 at 11am  Provider:   Gordy Bergamo, MD    We recommend signing up for the patient portal called MyChart.  Sign up information is provided on this After Visit Summary.  MyChart is used to connect with patients for Virtual Visits (Telemedicine).  Patients are able to view lab/test results, encounter notes, upcoming appointments, etc.  Non-urgent messages can be sent to your provider as well.   To learn more about what you can do with MyChart, go to [email or web address].

## 2024-05-05 ENCOUNTER — Other Ambulatory Visit (HOSPITAL_COMMUNITY): Payer: Self-pay

## 2024-05-05 ENCOUNTER — Ambulatory Visit

## 2024-05-05 ENCOUNTER — Other Ambulatory Visit: Payer: Self-pay

## 2024-05-05 DIAGNOSIS — I5032 Chronic diastolic (congestive) heart failure: Secondary | ICD-10-CM | POA: Diagnosis not present

## 2024-05-06 LAB — CUP PACEART REMOTE DEVICE CHECK
Battery Remaining Longevity: 64 mo
Battery Remaining Percentage: 57 %
Battery Voltage: 3.01 V
Brady Statistic AP VP Percent: 90 %
Brady Statistic AP VS Percent: 1 %
Brady Statistic AS VP Percent: 9.3 %
Brady Statistic AS VS Percent: 1 %
Brady Statistic RA Percent Paced: 89 %
Brady Statistic RV Percent Paced: 99 %
Date Time Interrogation Session: 20251211020016
Implantable Lead Connection Status: 753985
Implantable Lead Connection Status: 753985
Implantable Lead Implant Date: 20130716
Implantable Lead Implant Date: 20220317
Implantable Lead Location: 753859
Implantable Lead Location: 753860
Implantable Lead Model: 1948
Implantable Lead Model: 5076
Implantable Pulse Generator Implant Date: 20220317
Lead Channel Impedance Value: 350 Ohm
Lead Channel Impedance Value: 490 Ohm
Lead Channel Pacing Threshold Amplitude: 0.5 V
Lead Channel Pacing Threshold Amplitude: 0.75 V
Lead Channel Pacing Threshold Pulse Width: 0.5 ms
Lead Channel Pacing Threshold Pulse Width: 0.5 ms
Lead Channel Sensing Intrinsic Amplitude: 12 mV
Lead Channel Sensing Intrinsic Amplitude: 2 mV
Lead Channel Setting Pacing Amplitude: 1 V
Lead Channel Setting Pacing Amplitude: 1.5 V
Lead Channel Setting Pacing Pulse Width: 0.5 ms
Lead Channel Setting Sensing Sensitivity: 7 mV
Pulse Gen Model: 2272
Pulse Gen Serial Number: 3908614

## 2024-05-09 ENCOUNTER — Other Ambulatory Visit (HOSPITAL_COMMUNITY): Payer: Self-pay

## 2024-05-09 ENCOUNTER — Telehealth: Payer: Self-pay | Admitting: Cardiology

## 2024-05-09 DIAGNOSIS — I2729 Other secondary pulmonary hypertension: Secondary | ICD-10-CM

## 2024-05-09 MED ORDER — DILTIAZEM HCL ER COATED BEADS 240 MG PO CP24: 240.0000 mg | ORAL_CAPSULE | Freq: Every day | ORAL | 3 refills | Status: AC

## 2024-05-09 NOTE — Telephone Encounter (Signed)
 Refills has been sent to the pharmacy.

## 2024-05-09 NOTE — Telephone Encounter (Signed)
°*  STAT* If patient is at the pharmacy, call can be transferred to refill team.   1. Which medications need to be refilled? (please list name of each medication and dose if known)   diltiazem  (CARDIZEM  CD) 240 MG 24 hr capsule (Expired)    2. Which pharmacy/location (including street and city if local pharmacy) is medication to be sent to?  [facility] - [place], [place] - [address]    3. Do they need a 30 day or 90 day supply? 90

## 2024-05-10 ENCOUNTER — Other Ambulatory Visit: Payer: Self-pay

## 2024-05-12 NOTE — Progress Notes (Signed)
 Remote PPM Transmission

## 2024-05-13 ENCOUNTER — Ambulatory Visit: Payer: Self-pay | Admitting: Cardiology

## 2024-05-14 ENCOUNTER — Other Ambulatory Visit (HOSPITAL_COMMUNITY): Payer: Self-pay

## 2024-05-18 ENCOUNTER — Other Ambulatory Visit: Payer: Self-pay | Admitting: Cardiology

## 2024-05-18 LAB — BASIC METABOLIC PANEL WITH GFR
BUN/Creatinine Ratio: 25 — ABNORMAL HIGH (ref 10–24)
BUN: 33 mg/dL — ABNORMAL HIGH (ref 8–27)
CO2: 23 mmol/L (ref 20–29)
Calcium: 10 mg/dL (ref 8.6–10.2)
Chloride: 102 mmol/L (ref 96–106)
Creatinine, Ser: 1.34 mg/dL — ABNORMAL HIGH (ref 0.76–1.27)
Glucose: 174 mg/dL — ABNORMAL HIGH (ref 70–99)
Potassium: 4.6 mmol/L (ref 3.5–5.2)
Sodium: 138 mmol/L (ref 134–144)
eGFR: 52 mL/min/1.73 — ABNORMAL LOW

## 2024-05-21 ENCOUNTER — Other Ambulatory Visit (HOSPITAL_COMMUNITY): Payer: Self-pay

## 2024-05-22 ENCOUNTER — Other Ambulatory Visit (HOSPITAL_COMMUNITY): Payer: Self-pay

## 2024-05-23 ENCOUNTER — Other Ambulatory Visit: Payer: Self-pay | Admitting: Cardiology

## 2024-05-23 ENCOUNTER — Ambulatory Visit: Payer: Self-pay | Admitting: Cardiology

## 2024-05-23 NOTE — Progress Notes (Signed)
 Mild patient serum creatinine but overall stable but compared to 9 months ago and also 10 months ago, continue present management, continue Jardiance  10 mg.

## 2024-05-27 ENCOUNTER — Other Ambulatory Visit (HOSPITAL_COMMUNITY): Payer: Self-pay

## 2024-05-28 ENCOUNTER — Other Ambulatory Visit (HOSPITAL_COMMUNITY): Payer: Self-pay

## 2024-05-30 ENCOUNTER — Ambulatory Visit (INDEPENDENT_AMBULATORY_CARE_PROVIDER_SITE_OTHER): Admitting: Internal Medicine

## 2024-05-30 ENCOUNTER — Ambulatory Visit: Payer: Self-pay | Admitting: Internal Medicine

## 2024-05-30 ENCOUNTER — Other Ambulatory Visit (HOSPITAL_COMMUNITY): Payer: Self-pay

## 2024-05-30 ENCOUNTER — Encounter: Payer: Self-pay | Admitting: Internal Medicine

## 2024-05-30 VITALS — BP 124/58 | HR 60 | Temp 97.5°F | Ht 67.0 in | Wt 180.0 lb

## 2024-05-30 DIAGNOSIS — M359 Systemic involvement of connective tissue, unspecified: Secondary | ICD-10-CM | POA: Diagnosis not present

## 2024-05-30 DIAGNOSIS — J00 Acute nasopharyngitis [common cold]: Secondary | ICD-10-CM

## 2024-05-30 DIAGNOSIS — Z87891 Personal history of nicotine dependence: Secondary | ICD-10-CM | POA: Diagnosis not present

## 2024-05-30 DIAGNOSIS — J8489 Other specified interstitial pulmonary diseases: Secondary | ICD-10-CM | POA: Diagnosis not present

## 2024-05-30 LAB — POCT INFLUENZA A/B
Influenza A, POC: NEGATIVE
Influenza B, POC: NEGATIVE

## 2024-05-30 LAB — POC COVID19 BINAXNOW: SARS Coronavirus 2 Ag: NEGATIVE

## 2024-05-30 NOTE — Patient Instructions (Addendum)
"  °    ICD-10-CM   1. 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 Geronimo ;15-minute visit but after spirometry and DLCO  -Symptom score and exercise hypoxemia test at  "

## 2024-05-30 NOTE — Progress Notes (Signed)
 "       PCP [name], MD  HPI   IOV 07/10/2017  Chief Complaint  Patient presents with   Advice Only    Referred by [facility] due to SOB.  PFT done 05/28/17.  Pt has been having issues with SOB x4 months especially with exertion and has some mild chest tightness. Denies any cough.    84 year old male referred by Dr. Kelsie for evaluation of shortness of breath after cardiac etiologies ruled out.  He tells me that he is a remote smoker.  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  He has a long-standing history of rheumatoid arthritis followed by Dr. Maryl in [place].  He is to be on methotrexate for many years and stopped taking it because of cardiac dysfunction [he personally is convinced that methotrexate because this].  He was then on leflunomide  as of 2017 but is currently not on it.  His last rheumatoid factor and CCP antibodies were negative on my personal chart review of the outside records in 2016.    Now for the last 3 or 6 months he is got insidious onset of shortness of breath that is slowly progressive.  His dyspnea on exertion relieved by rest.  Class II-3 activities.  There I  s no associated cough or orthopnea proximal nocturnal dyspnea.  He did have some edema but this got cleared up but the dyspnea is continuing to get worse.  In the last few months he has had a cardiac echo that showed pulmonary hypertension.  Did have cardiac stress test that is normal.  Had pulmonary function test that showed isolated reduction in diffusion capacity and therefore he has been referred here.  Walking desaturation test on 07/10/2017 185 feet x 3 laps on ROOM AIR:  did NOT desaturate. Rest pulse ox was 100%, final pulse ox was 98%. HR response 60/min at rest to 121/min at peak exertion. Patient Cameron [name]  Did not Desaturate < 88% . Cameron [name] did not   Desaturated </= 3% points. Cameron Cameron [name] yes did get tachyardic   OV 08/04/2017  Chief Complaint  Patient presents with   Follow-up    ILD    Follow-up interstitial lung disease workup  After the last visit no interim problems.  He has some chronic pedal edema that he will talk to about with his primary care physician.  He did see Dr. Maryl rheumatologist in July 15, 2017.  I reviewed his notes.  It is not specifically indicate patient has nonspecific seronegative arthritis with a differential diagnosis of seronegative rheumatoid arthritis versus psoriatic.  Patient still seems to think the root of all his problems is the methotrexate he took for over 10 years.  At this point in time there is no decompensation.  As part of the ILD workup his pulmonary function test shows mild reduction in diffusion capacity.  Correlating with this is evidence of pulmonary hypertension on the echo and high-resolution CT chest enlarged pulmonary arteries.  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  His autoimmune panel is negative.  And so the vasculitis panel and hypersensitivity pneumonitis panel.    IMPRESSION: 1. Mild subpleural reticular densities in the posterolateral aspects of both lower lobes are suspicious for mild fibrotic interstitial lung  disease such as nonspecific interstitial pneumonitis. Early/mild usual interstitial pneumonitis is not excluded. 2. Aortic atherosclerosis (ICD10-170.0). Coronary artery calcification. 3. Enlarged pulmonary arteries, indicative of pulmonary arterial Hypertension.- > in echo  Nov 2018: Pulmonary arteries: Systolic pressure was moderately increased.   PA peak pressure: 55 mm Hg (S). 4. Left renal stone, partially imaged.     Electronically Signed   By: Newell Eke M.D.   On: 07/22/2017 15:03  OV 11/03/2017  Chief Complaint  Patient presents with   Follow-up    Pt  has SOB with exertion, some dry cough.     Follow-up suspected interstitial lung disease in the setting of rheumatoid arthritis and long methotrexate intake  He presents with his wife.  At the time of last visit I was not fully convinced that he had interstitial lung disease.  His CT scan indicated presence of pulmonary hypertension and so did his echocardiogram.  Therefore I referred him back to Dr. Kelsie cardiology.  In the spring 2019 he did have a right heart catheterization and left heart catheterization that showed mild pulmonary hypertension but also coronary artery blockage.  He status post 2 stents.  After that his shortness of breath improved but he tells me overall his fatigue level has not improved.  In talking to him I find out that he exercises 5 times a week doing heart track 2 times a week and the other 3 days walking a mile each time and doing weight lifting.  It appears that he takes a 20-minute nap after these exercises and then feels reenergized.  His mother feels that he does not have the effort tolerance as his younger days but he denies having any symptoms of chest pain or shortness of breath or cough when he does these heavy exertion the weight lifting or walking a mile out doing heart track.  In fact in the walking desaturation test today he walked extremely fast and had no problems.  In terms of his possible interstitial lung disease he had pulmonary function test today and felt to show some improvement and on exam he does not have any crackles.  Also his wife tells me that for the last 2 months he is using CPAP for sleep apnea and this is also helping him.  Right Heart Pressures RHC Procedural Findings: Hemodynamics (mmHg) RA mean 2 RV 42/6 PA 42/8, mean 21 PCWP mean 7 LV 134/8 AO 147/52       OV 06/01/2019  Subjective:  [name], male , DOB: 21-Feb-1941 , age 68 y.o. , MRN: [id number] , ADDRESS: [phone] [place] [address]   06/01/2019 -    Chief Complaint  Patient presents with   Follow-up    Pt states he has been doing okay since last visit but states he has been having a little more SOB x4 weeks now. Pt also has occ coughing with yellow phlegm.   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 -returns for follow-up.  I personally have not seen him since the summer 2019.  He says overall he has been stable.  In August 2020 he had a CT scan of the chest that confirmed the presence of interstitial lung disease in the setting of his rheumatoid arthritis.  He was stable.  His pulmonary function test was stable.  He says now in the last 2 months has had a decline in shortness  of breath.  There is also some cough with sputum production but that has resolved.  It is definitely present with exertion but relieved by rest.  His walking desaturation test compared to 18 months ago is roughly the same except that he is very tachycardic.  He does have a pacemaker.  He has an appoint with Dr. Kelsie his electrophysiologist today.  His symptom scores are listed below.     ROS - per HPI     OV 08/01/2019 - telephine visit - identified with 2 person identifier. Telephone visit - limits, risks benefits explained  Subjective:  [name], male , DOB: 1940/11/07 , age 28 y.o. , MRN: [id number] , ADDRESS: [phone] [place] [address]   08/01/2019 -  Follow-up  interstitial lung disease in the setting of rheumatoid arthritis and long methotrexate intake   HPI Cameron [name] 84 y.o. - similar dyspnea compared to [facility] 2021.  No better nor worse.  After last visit he has seen cardiology x2.  It appears the final conclusion is that diastolic dysfunction might be contributing to his shortness of breath.  But overall not major changes in his cardiac care.  He tells me that overall he is stable.  He had spirometry and DLCO the DLCO itself appears  to be reduced compared to the recent 1 but stable compared to older ones.  The FVC suggest decline.  Patient himself feels stable.  Overall some mixed picture.  Last high-resolution CT chest was October 2020.    IMPRESSION: 1. 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 There is no significant air trapping on expiratory phase imaging. Findings are not significantly changed compared to prior examinations and remain in an indeterminate for UIP pattern by ATS pulmonary fibrosis criteria, differential considerations including both UIP and NSIP.   2.  Coronary artery disease and aortic atherosclerosis.   3.  Left nephrolithiasis.     Electronically Signed   By: Marolyn Jaksch M.D.   On: 01/12/2019 15:08    OV 10/17/2019  Subjective:  [name], male , DOB: Oct 01, 1940 , age 33 y.o. , MRN: [id number] , ADDRESS: [phone] [place] [address]   10/17/2019 -   Chief Complaint  Patient presents with   Follow-up    pt states sobwhen doing activities.   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 -presents for follow-up after having his spirometry and DLCO and high-resolution CT chest.  Overall he feels stable compared to the last visit but he says definitely compared to 2 years ago his symptoms are worse.  Compared to 1 year ago his symptoms are the same.  He is kind of leery of taking new medications.  His high-resolution CT scan of the chest indicates mild progression since 2019 February.  His pulmonary function tests also indicate progression compared to 2 years ago but fluctuant in more recent times.  He is reporting agent orange exposure and  is wondering if his ILD could be related to that.  He reminded me that he is already on the ILD-pro registry study.  His next scheduled visit is in October 2021.   High-resolution CT chest May 2021 Lungs/Pleura: Peripheral and basilar predominant subpleural  interlobular and intra lobular septal thickening and ground-glass. Findings persist on prone imaging and appear similar to 01/12/2019 but may be minimally progressive from 07/22/2017. 4 mm peripheral left lower lobe nodule (14/101), stable from 07/22/2017 and considered benign. Lungs are otherwise clear. No pleural fluid. Airway is unremarkable. Mild air trapping.   Upper Abdomen: Visualized portions of the liver and adrenal glands are unremarkable. Stones are seen in the kidneys bilaterally. Spleen and visualized portions of the pancreas, stomach and bowel are grossly unremarkable. Cholecystectomy. No upper abdominal adenopathy.   Musculoskeletal: Degenerative changes in the spine. No worrisome lytic or sclerotic lesions.   IMPRESSION: 1. Pulmonary parenchymal pattern of fibrosis appears grossly stable from 01/12/2019 but may be minimally progressive from 07/22/2017. 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 Usual interstitial pneumonitis is certainly not excluded. Findings are indeterminate for UIP per consensus guidelines: Diagnosis of Idiopathic Pulmonary Fibrosis: An Official ATS/ERS/JRS/ALAT Clinical Practice Guideline. Am JINNY Honey Crit Care Med Vol 198, Iss 5, [id number], Jan 24 2017. 2. Bilateral renal stones. 3. Aortic atherosclerosis (ICD10-I70.0). Coronary artery calcification. 4. Enlarged pulmonic trunk, indicative of pulmonary arterial hypertension.     Electronically Signed   By: Newell Eke M.D.   On: 10/10/2019 14:00  ROS - per HPI     OV 06/29/2020  Subjective:  [name], male , DOB: 04/24/1941 , age 66 y.o. , MRN: [id number] , ADDRESS: [address] [place] [address] PCP [name], MD Patient Care Team: [name], MD as PCP - General (Unknown Physician Specialty) Kelsie Lynwood, MD as PCP - Electrophysiology (Cardiology) Burnard Debby DELENA, MD as PCP - Cardiology (Cardiology) Shlomo Wilbert SAUNDERS, MD as PCP - Sleep Medicine (Cardiology)  This Provider for this visit: Treatment Team:  Attending Provider: Geronimo Amel, MD    06/29/2020 -   Chief Complaint  Patient presents with   Follow-up    SOB unchanged, slight nonproductive cough. Esbriet  doing well.   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 Normal echo Jan 2021   HPI Cameron [name] 84 y.o. -returns for follow-up.  He presents with his wife.  Last seen in May 2021.  After that in June 2021 when he started pirfenidone  for progressive ILD.  He tells me that he has been tolerating pirfenidone  just fine.  Of note he has not had any liver function test since he started pirfenidone .  He is not having any GI side effects of skin side effects from the pirfenidone .  In the last 6 months his ILD stable according to his history.  His walking desaturation test and symptom scores are stable.  He is up-to-date with his Covid vaccine.  He is on leflunomide .  He is on the ILD-pro registry study with a last visit was in November 2021.  He needs a visit every 6 months.  He is willing to get a Covid IgG checked because he is immunosuppressed.  This is in response to humoral immunity to vaccine     OV 04/09/2021  Subjective:  [name], male , DOB: 1941/05/01 , age 84 y.o. , MRN: [id number] , ADDRESS: 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 Normal echo Jan 2021  HPI Agent LABOR [name] 84 y.o. -returns for follow-up.  He tells me that in the last 6 months since his last visit he is having progressively more shortness of breath with exertion relieved by rest.  In fact his dyspnea score shows worsening to a total score of 9 compared to earlier in the year.  But there is no worsening cough.  He had pulmonary function test that shows continued stability since 2021 feb but definitely worse since 2019 and 2020.  In other words the stability correlates with him starting pirfenidone .  Explained to him that the pirfenidone  is helping his ILD stability.  Nevertheless his symptoms are worse.  He had echocardiogram yesterday that shows continued elevation in pulmonary artery pressures [2019 borderline elevation] and grade 2 diastolic dysfunction.  Of note he had his ILD-Pro registry research visit today. Last liver function test for drug-induced liver injury monitoring was in February 2022.  GFR at that time was 57.  In March 2022.  His wife has suspected ILD and she is also with him in this visit.  I saw her for a scheduled visit just earlier.      OV  06/13/2021  Subjective:  Patient ID: Agent LABOR [name], male , DOB: 21-Sep-1940 , age 68 y.o. , MRN: [id number] , ADDRESS: [address] [place] [address] PCP [name], MD Patient Care Team: [name], MD as PCP - General (Unknown Physician Specialty) Kelsie Agent, MD as PCP - Electrophysiology (Cardiology) Burnard Debby LABOR, MD as PCP - Cardiology (Cardiology) Shlomo Wilbert SAUNDERS, MD as PCP - Sleep Medicine (Cardiology)  This Provider for this visit: Treatment Team:  Attending Provider: Geronimo Amel, MD    06/13/2021 -   Chief Complaint  Patient presents with   Follow-up    Pt is following up after recent heart cath.    HPI JHAN CONERY 84 y.o. -returns for follow-up to discuss the implications of the right heart cath results.  He had right heart cath on 05/29/2021.  Around this time he also had some mild anemia that is documented below.  But his recent creatinine is normal.  His right heart cath shows worsening of mean arterial pressure compared to a few years ago but his PVR is low and his wedge is high.  He also has moderate mitral regurgitation and had a high V wave.  Compared to last visit he stable but over time he said worsening dyspnea.  He is here with his wife.  I told them both that this looks like pulmonary venous hypertension.  He does not have edema.  We discussed the  possibility of doing Lasix  therapy.  I told him he does not meet official criteria for inhaled treprostinil and WHO group 3 pulmonary hypertension.  For inhaled treprostinil he would need a wedge less than 15 and a PVR greater than 3.  He does not have that.  He is agreed to do Lasix  at this point.  He will have to follow-up with Dr. Debby Sor for this.  In terms of his interstitial lung disease he has a surveillance CT scan coming up in March 2023 [last CT was then May 2021].  I think we will postpone this and just see him in 4 to 6 months for ILD follow-up given recent stability and  pulmonary function test.  What he needs is follow-up for his shortness of breath and to see how the Lasix  is performing.  For this we will make a nurse practitioner visit and also encouraged him to go and see Dr. Sor.  CT Chest data  No results found.   07/11/2021: Today - follow up Patient presents today with wife via virtual visit for follow up after being started on lasix  for mild pulmonary HTN. He states he started the medication but has since stopped due to some bladder issues. He reports his breathing has been stable and overall, he feels pretty well. He denies any lower extremity swelling, palpitations, orthopnea, PND, cough or wheezing. He is doing well on Esbriet . He has developed a rash over the last 3-4 days after being transitioned from metformin to Jardiance . He denies any facial or tongue swelling, difficulties breathing or swallowing, or N/V.     OV 10/01/2021  Subjective:  [name], male , DOB: 12-13-1940 , age 77 y.o. , MRN: [id number] , ADDRESS: [address] [place] [address] PCP [name], MD Patient Care Team: [name], MD as PCP - General (Unknown Physician Specialty) Kelsie Lynwood, MD as PCP - Electrophysiology (Cardiology) Sor Debby DELENA, MD as PCP - Cardiology (Cardiology) Shlomo Wilbert SAUNDERS, MD as PCP - Sleep Medicine (Cardiology)  This Provider for this visit: Treatment Team:  Attending Provider: Geronimo Amel, MD    10/01/2021 -   Chief Complaint  Patient presents with   Follow-up    PFT performed today.  Pt states he has been doing good since last visit and denies any complaints.    HPI EBRIMA RANTA 84 y.o. -returns for routine follow-up.  After I saw him in January 2023 we gave him a trial of Lasix  because of diastolic dysfunction and pulmonary venous hypertension but this did not help.  He denied acute video visit in March 2023 because of COVID-19.  He took antiviral.  Currently doing well except for  the fact that he has had increased fatigue compared to baseline [the symptom score].  The fatigue is significant especially by the afternoon.  Also for the last few weeks he has had diarrhea that is moderate in intensity.  There is no changes in his medications.  Over time his creatinine has gotten worse.  He does have chronic kidney disease and is seen his primary care.  He also tells me that he was taking potassium and recently has had hyperkalemia.  This was getting worse as of yesterday with a potassium of 6.1 mEq.  This was at outside facility and I was able to review the lab results.  His primary care is intently watching this.  He is off his diuretics and potassium supplementation.  I have  instructed him to have repeat potassium today.  In terms of his ILD it is stable.  Pulmonary function tests are stabilized.  His CT scan of the chest was reviewed and this shows a stable ILD in the last 2 years.  Unclear to me if he is still on leflunomide  or not. He is supposed have a research registry follow-up visit today.  Creatinine profile shows a slow worsening over time.          OV 11/27/2021  Subjective:  [name], male , DOB: February 18, 1941 , age 62 y.o. , MRN: [id number] , ADDRESS: [address] [place] [address] PCP [name], MD Patient Care Team: [name], MD as PCP - General (Unknown Physician Specialty) Kelsie Lynwood, MD as PCP - Electrophysiology (Cardiology) Burnard Debby DELENA, MD as PCP - Cardiology (Cardiology) Shlomo Wilbert SAUNDERS, MD as PCP - Sleep Medicine (Cardiology)  This Provider for this visit: Treatment Team:  Attending Provider: Geronimo Amel, MD    11/27/2021 -   Chief Complaint  Patient presents with   Follow-up    Pt to discuss Esbriet . Pt still doing good since LOV.    HPI Cameron [name] 84 y.o. -returns for follow-up.  At the last visit in May 2023 because of rising creatinine we reduce his pirfenidone  dose  particularly in the setting of fatigue.  It seems lowering the pirfenidone  dose does not really help the fatigue but it does seem to overall make him feel better.  He is stable.  His dyspnea is better.  His diarrhea is very minimal to nonexistent.  He had pulmonary function test and this shows continued stability since being on pirfenidone .  He had ILD-Pro registry visit in the spring 2023.  His next visit is due sometime around October 2023.  We discussed his rising creatinine.  I advised him to stay on the lower dose of the pirfenidone .  This would be 2 pills 3 times daily.  He did indicate and his wife also indicated that he is having difficult time controlling his hyperkalemia.  Review of his medication shows that he is on ARB s [telmisartan ].  Discussed this is a potential contributory etiology for his hyperkalemia.  He is willing to get his creatinine and his potassium checked today in the office.  But his primary care is the one who is managing that.   Noted he is on fish oil but he says this actually helps him with his arthritis.     OV 05/06/2022  Subjective:  [name], male , DOB: 04-20-1941 , age 73 y.o. , MRN: [id number] , ADDRESS: [address] [place] [address] PCP [name], MD Patient Care Team: [name], MD as PCP - General (Unknown Physician Specialty) Kelsie Lynwood, MD as PCP - Electrophysiology (Cardiology) Burnard Debby DELENA, MD as PCP - Cardiology (Cardiology) Shlomo Wilbert SAUNDERS, MD as PCP - Sleep Medicine (Cardiology) Duke, Jon Garre, PA as Physician Assistant (Cardiology)  This Provider for this visit: Treatment Team:  Attending Provider: Geronimo Amel, MD    05/06/2022 -   Chief Complaint  Patient presents with   Follow-up    Pt states he has been doing okay since last visit and denies any complaints.     SABRA  HPI Cameron [name] 84 y.o. -returns for follow-up.  Presents with his wife.  He feels he is doing  stable.  Symptom score shows stability but to  me he obviously looks like he has lost weight.  He then told me that ever since COVID earlier in the year he is having dysgeusia and low appetite and therefore he is losing weight.  Review of the records indicate he is lost 20 pounds in the last 1 year.  I did indicate to him that concern of COVID causing his dysgeusia is pirfenidone  could be causing his dysgeusia and low appetite and weight loss.  He is willing to give a short holiday and reassess.  He has not had pulmonary function test recently but he feels stable.  He is due for monitoring safety labs with his pirfenidone  high risk medication.  He continues on immunosuppression.      OV 08/28/2022  Subjective:  [name], male , DOB: [birth year] , age 24 y.o. , MRN: [id number] , ADDRESS: [address] [place] [address] PCP [name], MD Patient Care Team: [name], MD as PCP - General (Unknown Physician Specialty) Kelsie Lynwood, MD (Inactive) as PCP - Electrophysiology (Cardiology) Burnard Debby DELENA, MD as PCP - Cardiology (Cardiology) Shlomo Wilbert SAUNDERS, MD as PCP - Sleep Medicine (Cardiology) [facility], Jon Garre, PA as Physician Assistant (Cardiology)  This Provider for this visit: Treatment Team:  Attending Provider: Geronimo Amel, MD    08/28/2022 -   Chief Complaint  Patient presents with   Follow-up    Fatigue, sob  HPI Cameron [name] 84 y.o. -last seen in December 2023.  At that time he was having dysgeusia after COVID.  Also having weight loss.  We do not know if pirfenidone  was involved so told him to stop it for a few weeks which she did but it did not really resolve his symptoms.  He presents for follow-up but it appears in January 2024 [actually between 05/23/2022 - 05/30/2022] he was admitted with melena and upper GI bleed got 3 units of blood.  Extensive endoscopy colonoscopy and camera test were all negative.  Since then they have been  monitoring his hemoglobin at home and it is improved.  He did follow-up with Dr. GEANNIE Kidney on 07/01/2022 at GI clinic.  He states ever since then he feels a lot more fatigue despite improvement in hemoglobin he is more short of breath.  His mood is also low.  [facility] and Zoloft but he denies being sad.  Although depression has been diagnosed.  Definitely not suicidal.  He is unable to understand his fatigue.  Wife also affirms the same.  Review of his labs included 07/01/2022 low vitamin D  but is not on supplements and abnormal TSH in 2018 which she believes has not been rechecked.  He believes he is lost weight but according to our indicators his weight is stable  His last echocardiogram was in November 2022 And his last high-resolution CT chest was in May 2023   OV 09/25/2022  Subjective:  [name], male , DOB: 12/26/1940 , age 57 y.o. , MRN: [id number] , ADDRESS: [address] [place] [address] PCP [name], MD Patient Care Team: [name], MD as PCP - General (Unknown Physician Specialty) Kelsie Lynwood, MD (Inactive) as PCP - Electrophysiology (Cardiology) Burnard Debby DELENA, MD as PCP - Cardiology (Cardiology) Shlomo Wilbert SAUNDERS, MD as PCP - Sleep Medicine (Cardiology) [facility], Jon Garre, PA as Physician Assistant (Cardiology)  This Provider for this visit: Treatment Team:  Attending Provider: Geronimo Amel, MD    09/25/2022 -   Chief Complaint  Patient presents with   Follow-up    F/up prescription, weight loss     HPI Cameron [name] 84 y.o. -returns for follow-up.  I saw him a month ago for his weight loss and dysgeusia.  We had him stop pirfenidone .  He stopped it he is not taking it for the last 1 month.  His wife is here with him and she is independent historian.  They are alarmed of the weight loss there is more weight loss is at least 20 pounds of weight loss in the last 1 month.   Yesterday was started on Aldactone  for his new onset heart failure which we diagnosed after his last visit.  He saw Dr. Debby Sor for that.  He is also got an iron  infusion yesterday for his unexplained anemia.  Review of medication shows that he is on Areva which can cause weight loss.  He temporarily stopped it 2 weeks ago but restarted it 2 days ago.  He restarted it because his arms were hurting.  He is also on Jardiance  for the last 2 years and this correlates with the time.  his weight loss although he was on pirfenidone  at the same time but the fact that he is losing weight despite coming off pirfenidone .  Did simple exercise hypoxemia test.  I made him do a sit/stand test for ten reps.  He did not desaturate   Last Weight  Most recent update: 09/25/2022  1:03 PM    Weight  71.8 kg (158 lb 6.4 oz)                OV 11/20/2022  Subjective:  [name], male , DOB: 27-Jan-1941 , age 43 y.o. , MRN: [id number] , ADDRESS: [address] [place] [address] PCP [name], MD Patient Care Team: [name], MD as PCP - General (Unknown Physician Specialty) Sor Debby DELENA, MD as PCP - Cardiology (Cardiology) Shlomo Wilbert SAUNDERS, MD as PCP - Sleep Medicine (Cardiology) Cindie Ole DASEN, MD as PCP - Electrophysiology (Cardiology) [facility], Jon Garre, PA as Physician Assistant (Cardiology)  This Provider for this visit: Treatment Team:  Attending Provider: Geronimo Amel, MD    11/20/2022 -   Chief Complaint  Patient presents with   Follow-up    F/up, no complaints.     HPI Cameron [name] 84 y.o. -returns for follow-up.  Presents with his wife wife is an independent historian.  She gives most of the history.  But they both aligned at the information.   #Symptoms of weight loss, dysgeusia and fatigue  He is now off pirfenidone  since early April 2024.  He states that his taste is improved.  He also tells me that his weight loss is  resolving [see below and it actually is resolving.].  His fatigue is also improving.  The temporal events related to his improvement in fatigue and weight weight loss and also loss of taste is the following   -Holding pirfenidone  since early April 2024  -Diagnosis of systolic heart failure on the echocardiogram April 2021 starting Aldactone  by Dr. Debby Sor   -Saw primary care Reyes Auston and review of the records indicate 10/29/2022 he had C. difficile toxin positivity on stool test.  In fact even before this on GI visit 10/16/2022 with Dr. Federico she noticed that outside he had a test called GI 360 with  life extension which showed positive C. difficile toxin and high fecal calprotectin.  He took Dificid  course  -Also during this time he has seen 1 Dr. Fairy Pouch in [place] was an MD but practices homeopathy.  2 weeks ago he was started on ivermectin and he feels within 2 days his symptoms started improving.  -Of all these measures he feels ivermectin is the one that helped him the most.   -He at this point and his wife want to restart the pirfenidone  but he is on ivermectin   #Pulmonary fibrosis: He feels it is stable.  In fact symptom scores are stable.  Excess hypoxemia test is stable.  He has not had pulmonary function test today at this visit.  The stability is compared to the last visit.  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  This because when I first met him his chart review at [facility] rheumatology could not in clinic suggested he had seronegative rheumatoid arthritis.  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  This diagnosis was given at sometime in the past.  I do not know about this.  He is on Areva for this.  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  However there have been no impact to the management.     OV 01/22/2023  Subjective:  [name], male , DOB: 10-04-1940 , age 37 y.o. , MRN: [id number] , ADDRESS: [address] [place] [address] PCP [name], MD Patient Care Team: [name], MD as PCP - General (Unknown Physician Specialty) Burnard Debby DELENA, MD as PCP - Cardiology (Cardiology) Shlomo Wilbert SAUNDERS, MD as PCP - Sleep Medicine (Cardiology) Cindie Ole DASEN, MD as PCP - Electrophysiology (Cardiology) [facility], Jon Garre, PA as Physician Assistant (Cardiology)  This Provider for this visit: Treatment Team:  Attending Provider: Geronimo Amel, MD   01/22/2023 -   Chief Complaint  Patient presents with   Follow-up    Review PFT.  Cough x 2 weeks.  Patient states he had a cold 2 weeks ago.     HPI Cameron [name] 84 y.o. -returns for follow-up.  Presents with his wife Orie she is an independent historian.  They both tell me that he continues with his ivermectin.  He has 1 week left on it.  He has now regained his weight completely.  He is back to baseline.  Dysgeusia is almost gone.  He attributes this to ivermectin and also stopping the pirfenidone .  Pulmonary function test and it is stable.  Symptoms are stable.  Recently had a cold wife also had a cold he is coughing because of that.  But it is resolving.  He does not want to take antibiotic and prednisone  for this.  He brought to my attention right lower extremity.  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  I gave him some advice on that.       OV 12/03/2023  Subjective:  [name], male , DOB: [birth year] , age 51 y.o. , MRN: [id number] , ADDRESS: [address] [place] [address] PCP [name], MD Patient Care Team: [name], MD as PCP -  General (Unknown Physician Specialty) Burnard Debby LABOR, MD as PCP -  Cardiology (Cardiology) Shlomo Wilbert SAUNDERS, MD as PCP - Sleep Medicine (Cardiology) Cindie Ole DASEN, MD as PCP - Electrophysiology (Cardiology) [facility], Jon Garre, PA as Physician Assistant (Cardiology) Jacobo Evalene PARAS, MD as Consulting Physician (Oncology)  This Provider for this visit: Treatment Team:  Attending Provider: Geronimo Amel, MD 12/03/2023 -   Chief Complaint  Patient presents with   Follow-up    PFT follow up       HPI Cameron LABOR [name] 84 y.o. 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  He remains only on Areva for his rheumatoid arthritis.  He stopped taking pirfenidone  because of side effects.  I was supposed to see him back in 6 months but he comes back a year later.  In the interim Interim Health status: No new complaints No new medical problems. No new surgeries. No ER visits. No Urgent care visits. No changes to medications.SABRA  He is here with his wife who I saw as a patient yesterday.  They are going to go to [place]  coming up in a month or so.  Overall well except for the fact that he is got claudication in his right lower extremity lateral part [?  Meralgia paresthetica] he is working on this with his primary care physician as a new issue.  He had pulmonary function test and it is stable although the DLCO might have declined its only barely declined.  His exercise hypoxemia test is stable and his symptom score show continued stability.   OV 05/30/2024  Subjective:  Patient ID: Cameron LABOR [name], male , DOB: 08-05-1940 , age 45 y.o. , MRN: [id number] , ADDRESS: [address] [place] [address] PCP [name], MD Patient Care Team: [name], MD as PCP - General (Unknown Physician Specialty) Shlomo Wilbert SAUNDERS, MD as PCP - Sleep Medicine (Cardiology) Cindie Ole DASEN, MD as PCP - Electrophysiology (Cardiology) Ladona Heinz, MD as PCP - Cardiology (Cardiology) [facility], Jon Garre, PA as Physician Assistant (Cardiology) Jacobo Evalene PARAS, MD as  Consulting Physician (Oncology)  This Provider for this visit: Treatment Team:  Attending Provider: Geronimo Amel, MD    Arthritis - > do [facility] clinic rheumatology -2019 notes at [facility] clinic suggest seronegative rheumatoid arthritis ` -11/18/2022 given a diagnosis of psoriatic arthritis by Dr. 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  However and noticed that [facility] rheumatology on 11/18/2022 gave your diagnosis of psoriatic arthritis.  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  However this does not impact current treatment recommendation.) History of agent orange exposure   -on Arava ,  ? Pn med list May 2023  = Started pirfenidone  June 2021 -> held April 2024 secondary to weight loss and dysgeusia and fatigue.  - ILD-Pro registry stdy  - last visit 04/09/2021  - Last HRCT May 2021 -. May 2023 without change (indeterminate)   RHC 05/29/21 - Mild pulmonary hypertension in this patient with interstitial lung disease with elevated RV  54/12; PA pressure 55/18; mean 31 mmHg.  PVR 2.6 WU. PW mean pressure is 17 mmHg, with V wave 24 c/w his echo documented mild - moderate mitral regurgitation.   CKD -  -07/13/2019: Creatinine 1.2 mg percent -05/23/2021 creatinine 1.15 mg percent  - 06/13/21 creat 1.6mg %, GFR 39 - 09/30/21 creat 1.23m GFR 42 - 10/16/21 - creat 1.84mg % - GFR 34 -11/27/2021: Creatinine 1.31 mg percent\\ - 05/30/22 - cret  0.95mg %  Mild COVID early 2023  Admission for blood loss anemia -without identifiable source in January 2024 status post 3 units of blood  -Started iron  infusion Sep 24, 2022 with Dr. Jacobo  -Hemoglobin 11.7 g% 08/31/2022.   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 Debby Sor Sep 24, 2022  Norovirus infection 2018 Incidental/atypical C. difficile with high calprotectin -.  In May 2024   Unintentional weight loss as below   - in symptom chart  - esbriet  and improved with ivermectin - 2024   05/30/2024 -   Chief Complaint  Patient presents with   Acute Visit    Body aches, nasal congestion, rhinitis and dry cough x 2 days. Feels fatigued.      HPI Cameron [name] 84 y.o. -Yahye Siebert is an 84 year old male with pulmonary fibrosis who presents with upper respiratory symptoms. He is accompanied by his wife, Mrs. Skellenger.  He developed upper respiratory symptoms starting with a runny nose and sinus congestion, followed by a sore throat two days ago. Yesterday, he developed a severe cough which has since improved. No significant fever, with a maximum temperature of 99.54F at the onset of symptoms. He experiences body aches and mild chills, with some sputum production described as clear.  He notes feeling slightly short of breath, though this has been a chronic issue and has not worsened significantly in the past two days. He has a history of pulmonary fibrosis.  He has been using over-the-counter cold medications and Vicks VapoRub for symptom relief, which he feels has been effective. He received a flu shot two weeks ago.  SPOT antigen  - flu A /B - negative  - covid - negative  Has PFT pending 05/31/24 but up until current illness doing really well      SYMPTOM SCALE - ILD 10/17/2019 Started pirfenidone  June 2021  06/29/2020  04/09/2021 193# 10/01/2021 Esbriet  lower dose since may 2023  186# 05/06/2022 Esbiret lowe rdose due to CKD  173# 08/28/2022 177# Esbiret low dose 09/25/2022 158# Off esbriet  since April 2024 visit xxxxx 11/20/2022 160# - s/ p C diff Rx end/may 2024 and IVermetcin starting mide June 2024 01/22/2023 179# - supporitve care 12/03/2023 Suporit care  O2 use ra ra ra ra ra ra ra ra ra  Shortness of Breath 0 -> 5 scale with 5 being worst (score 6 If unable to do)          At rest 0 0 0 0 1 2 0 0 0  Simple tasks - showers, clothes change, eating, shaving 1 1 0 0 0 2 0 0 0  Household (dishes, doing bed, laundry) 1 0 0 0 0 2 0 0 0  Shopping 0 0 1 0 1 2 0 2 0  Walking level at own pace 2 0 4 0 2  3 0 3 1  Walking up Stairs 3 3 4 3 3 4  2.5 4 2   Total (30-36) Dyspnea Score 7 4 9 3 7 15  2.5 9 3   How bad is your cough? x 1  0 2 0 0 3 0  How bad is your fatigue 4 2 2 4 2 5 3 3 1   How bad is nausea 0 0 0 0 0 2 0 0 0  How bad is vomiting?  0 0 0 0 0 0 0 0 0  How bad is diarrhea? 1 0 0 0.5 0 5 0 0 0  How bad is anxiety? 1 1 1 2 1 2 2 2  0  How bad is depression 1 1  1 1 1 4 2 1  0        Simple office walk 185 feet x  3 laps goal with forehead probe 11/03/2017  06/01/2019  06/29/2020  04/09/2021  10/01/2021  09/25/2022  11/20/2022  12/03/2023   O2 used Room air Room air  ra ra ra ra ra  Number laps completed 3 3  3     Sit stand x 15 times  Comments about pace Fast very  99% and 66    Sit stand 10 tmes Sit stand x 15 tmes Completed fast and 30 seconds  Resting Pulse Ox/HR 100% and 70/min 99% and 66/min 97% and 71 100% and Pulse 77 100% and  HR 67 98% and HR 53 98% and HR 63 98% with a heart rate of 57  Final Pulse Ox/HR 98% and 121/min 98% and 120 min 98% and 121/min 99% and HR 114 97% and HR 112 98% and HR 110 97% an dHR 115 98% with a peak rate of 121 seconds  Desaturated </= 88% no    no     Desaturated <= 3% points no    Yes 3 pnts     Got Tachycardic >/= 90/min yes    yes     Symptoms  at end of test none Mild dyspea No dyspnea Dyspnea at end Noe copmpaint Mild ydpnes    Miscellaneous comments veryu fast Mod pace fast Fast pace moderate        PFT     Latest Ref Rng & Units 12/03/2023    9:54 AM 01/21/2023   11:20 AM 08/25/2022   10:58 AM 10/01/2021   10:03 AM 04/17/2020    9:34 AM 10/13/2019    3:53 PM 07/21/2019    3:53 PM  PFT Results  FVC-Pre L 3.09  2.77  2.80  3.27  3.05  3.08  2.92   FVC-Predicted Pre % 97  86  81  93  86  86  81   Pre FEV1/FVC % % 82  83  84  84  81  81  82   FEV1-Pre L 2.53  2.30  2.34  2.75  2.46  2.49  2.40   FEV1-Predicted Pre % 115  102  97  112  98  98  94   DLCO uncorrected ml/min/mmHg 16.28  18.55  20.12  18.84  19.38  19.58  20.08   DLCO UNC% % 78  88  91  85  87  87  89   DLCO corrected ml/min/mmHg 17.66  21.63  22.73  18.63  19.38  19.58  20.08   DLCO COR %Predicted % 84  103  103  84  87  87  89   DLVA Predicted % 98  125  141  92  105  101  112        LAB RESULTS last 96 hours No results found.       has a past medical history of Anemia, Anxiety (self recent), Appendicitis, Atrial fibrillation (HCC) (12/12/2013), Bone cancer (HCC) (father  45), BPH (benign prostatic hypertrophy), CAD (coronary artery disease) (12/2015), Chest pain (12/03/2015), CHF (congestive heart failure) (HCC), Complete heart block (HCC), Coronary artery disease, Coronary artery disease involving native coronary artery of native heart with unstable angina pectoris (HCC) (08/11/2017), D-dimer, elevated (04/03/2017), Depression (self recent), Drug reaction (07/11/2021), GERD (gastroesophageal reflux disease), History of blood transfusion (1968), History of kidney stones, History of SCC (squamous cell carcinoma) of skin (07/24/2020), Hyperglycemia (11/05/2013), Hyperlipidemia (11/05/2013), Hypertension, Hypothyroidism, Hypothyroidism, unspecified (11/05/2013), 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   reports that he quit smoking about 51 years ago. His smoking use included cigarettes. He started smoking about 56 years ago. He has a 5 pack-year smoking history. He quit smokeless tobacco use about 51 years ago.  His smokeless tobacco use included chew.  Past Surgical History:  Procedure Laterality Date   BACK SURGERY     BALLOON DILATION N/A 05/24/2022   Procedure: BALLOON DILATION;  Surgeon: Federico Rosario BROCKS, MD;  Location[facility] ENDOSCOPY;  Service: Gastroenterology;  Laterality: N/A;   CARDIAC CATHETERIZATION N/A 12/28/2015   Procedure: Left Heart Cath and Coronary Angiography;  Surgeon: Victory LELON Sharps, MD;  Location: [facility] INVASIVE CV LAB;  Service: Cardiovascular;  Laterality: N/A;   CATARACT EXTRACTION W/ INTRAOCULAR LENS  IMPLANT, BILATERAL Bilateral    COLONOSCOPY WITH PROPOFOL  N/A 05/25/2022   Procedure: COLONOSCOPY WITH PROPOFOL ;  Surgeon: Federico Rosario BROCKS, MD;  Location: [facility] ENDOSCOPY;  Service: Gastroenterology;  Laterality: N/A;   CORONARY ANGIOPLASTY WITH STENT PLACEMENT  08/11/2017   2 stents   CORONARY STENT INTERVENTION N/A 08/11/2017   Procedure: CORONARY STENT INTERVENTION;  Surgeon: Burnard Debby LABOR, MD;  Location: [facility] INVASIVE CV LAB;  Service: Cardiovascular;  Laterality: N/A;   CYSTOSCOPY W/ STONE MANIPULATION     ESOPHAGOGASTRODUODENOSCOPY (EGD) WITH PROPOFOL  N/A 05/24/2022   Procedure: ESOPHAGOGASTRODUODENOSCOPY (EGD) WITH PROPOFOL ;  Surgeon: Federico Rosario BROCKS, MD;  Location: [facility] ENDOSCOPY;  Service: Gastroenterology;  Laterality: N/A;   GIVENS CAPSULE STUDY N/A 05/25/2022   Procedure: GIVENS CAPSULE STUDY;  Surgeon: Federico Rosario BROCKS, MD;  Location: [facility] ENDOSCOPY;  Service: Gastroenterology;  Laterality: N/A;   INGUINAL HERNIA  REPAIR Left    INSERT / REPLACE / REMOVE PACEMAKER  12/09/2011   SJM Acc[employer] DR RF implanted by DR Allred for complete heart block and syncope   JOINT REPLACEMENT     LAPAROSCOPIC CHOLECYSTECTOMY     LEAD REVISION/REPAIR N/A 08/09/2020   Procedure: LEAD REVISION/REPAIR;  Surgeon: Kelsie Agent, MD;  Location: [facility] INVASIVE CV LAB;  Service: Cardiovascular;  Laterality: N/A;   LITHOTRIPSY     LUMBAR DISC SURGERY     removed arthritis and spurs   PERMANENT PACEMAKER INSERTION N/A 12/09/2011   Procedure: PERMANENT PACEMAKER INSERTION;  Surgeon: Agent Kelsie, MD;  Location: [facility] CATH LAB;  Service: Cardiovascular;  Laterality: N/A;   PPM GENERATOR CHANGEOUT N/A 08/09/2020   Procedure: PPM GENERATOR CHANGEOUT;  Surgeon: Kelsie Agent, MD;  Location: [facility] INVASIVE CV LAB;  Service: Cardiovascular;  Laterality: N/A;  PROSTATE SURGERY     REPLACEMENT TOTAL KNEE Right    RIGHT HEART CATH N/A 05/29/2021   Procedure: RIGHT HEART CATH;  Surgeon: Burnard Debby LABOR, MD;  Location: [facility] INVASIVE CV LAB;  Service: Cardiovascular;  Laterality: N/A;   RIGHT/LEFT HEART CATH AND CORONARY ANGIOGRAPHY N/A 08/11/2017   Procedure: RIGHT/LEFT HEART CATH AND CORONARY ANGIOGRAPHY;  Surgeon: Rolan Ezra RAMAN, MD;  Location: [facility] INVASIVE CV LAB;  Service: Cardiovascular;  Laterality: N/A;   TRANSURETHRAL RESECTION OF PROSTATE  2017/2018    Allergies[1]  Immunization History  Administered Date(s) Administered   Fluad Quad(high Dose 65+) 02/11/2020   INFLUENZA, HIGH DOSE SEASONAL PF 03/26/2017, 02/11/2022   Influenza Inj Mdck Quad Pf 03/25/2021   Influenza-Unspecified 05/03/2015, 02/13/2016, 03/26/2016, 03/19/2017, 03/03/2018, 03/17/2019, 04/09/2020, 04/18/2021   PFIZER Comirnaty(Gray Top)Covid-19 Tri-Sucrose Vaccine 06/11/2019, 07/02/2019, 07/04/2019, 07/18/2019, 03/01/2020, 11/15/2020   PFIZER(Purple Top)SARS-COV-2 Vaccination 06/11/2019, 07/04/2019, 07/18/2019, 03/01/2020   Pneumococcal Conjugate-13 02/11/2020    Pneumococcal Polysaccharide-23 12/10/2011, 01/30/2018   Pneumococcal-Unspecified 03/26/2016, 02/04/2018   Tdap 01/17/2016, 02/04/2018   Zoster Recombinant(Shingrix) 08/14/2020, 11/29/2020    Family History  Problem Relation Age of Onset   Pancreatic cancer Mother    Cancer Mother    Bone cancer Father    Cancer Father    Alzheimer's disease Sister    Heart attack Paternal Uncle    Arthritis Maternal Grandfather    Alzheimer's disease Paternal Grandmother    Lung cancer Paternal Grandfather     Current Medications[2]      Objective:   Vitals:   05/30/24 1326  BP: (!) 124/58  Pulse: 60  Temp: (!) 97.5 F (36.4 C)  TempSrc: Oral  SpO2: 96%  Weight: 180 lb (81.6 kg)  Height: 5' 7 (1.702 m)    Estimated body mass index is 28.19 kg/m as calculated from the following:   Height as of this encounter: 5' 7 (1.702 m).   Weight as of this encounter: 180 lb (81.6 kg).  @WEIGHTCHANGE @  Filed Weights   05/30/24 1326  Weight: 180 lb (81.6 kg)     Physical Exam   General: No distress. Looks well but sounds nasally O2 at rest: no Cane present: no Sitting in wheel chair: no Frail: no Obese: no Neuro: Alert and Oriented x 3. GCS 15. Speech normal Psych: Pleasant Resp:  Barrel Chest - no.  Wheeze - no, Crackles - no, No overt respiratory distress CVS: Normal heart sounds. Murmurs - no Ext: Stigmata of Connective Tissue Disease - no HEENT: Normal upper airway. PEERL +. RUNNY NOSE +. EARS WITH WAX +       Assessment/     Assessment & Plan Acute rhinitis  Interstitial lung disease due to connective tissue disease [facility])    PLAN Patient Instructions      ICD-10-CM   1. 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 Geronimo ;15-minute visit but after spirometry and DLCO  -Symptom score and exercise hypoxemia test at     [facility]    Return for cancel PFT 05/31/24 and ROV DR Geronimo in 2 months 15 min visit but after spiro/dlco.    SIGNATURE    Dr. Dorethia Geronimo, M.D., F.C.C.P,  Pulmonary and Critical Care Medicine Staff Physician, [facility] Director - Interstitial Lung Disease  Program  Pulmonary Fibrosis [facility] at [facility] , [place], [address]  Pager: [phone], If no answer or between  15:00h - 7:00h: call [phone] Telephone: [phone]  1:48 PM 05/30/2024     [1]  Allergies Allergen Reactions   Ace Inhibitors Swelling    Tongue swelling, angioedema   Lisinopril      Other reaction(s): Lip swelling, O/E - lip swelling   Pirfenidone  Other (See Comments)    Unintentional weight loss and fatigue   Celecoxib Rash    Skin rash   [2]  Current Outpatient Medications:    acetaminophen  (TYLENOL ) 500 MG tablet, Take 500 mg by mouth as needed for moderate pain., Disp: , Rfl:    allopurinol (ZYLOPRIM) 300 MG tablet, Take 300 mg by mouth daily., Disp: , Rfl:    pravastatin  (PRAVACHOL ) 20 MG tablet, Take 1 tablet (20 mg total) by mouth every evening., Disp: 90 tablet, Rfl: 3   thyroid  (ARMOUR) 90 MG tablet, Take 120 mg by mouth every morning., Disp: , Rfl:    Cholecalciferol (VITAMIN D3) 250 MCG (10000 UT) capsule, Take 10,000 Units by mouth daily., Disp: , Rfl:     co-enzyme Q-10 30 MG capsule, Take 30 mg by mouth 3 (three) times daily., Disp: , Rfl:    diltiazem  (CARDIZEM  CD) 240 MG 24 hr capsule, Take 1 capsule (240 mg total) by mouth daily., Disp: 90 capsule, Rfl: 3   ELIQUIS  5 MG TABS tablet, TAKE 1 TABLET TWICE A DAY, Disp: 180 tablet, Rfl: 3   empagliflozin  (JARDIANCE ) 10 MG TABS tablet, Take 1 tablet (10 mg total) by mouth daily., Disp: 30 tablet, Rfl: 2   GEMTESA  75 MG TABS, TAKE 1 TABLET DAILY, Disp: 90 tablet, Rfl: 3   glimepiride (AMARYL) 4 MG tablet, Take 4 mg by mouth daily., Disp: , Rfl:    hydrALAZINE  (APRESOLINE ) 25 MG tablet, Take 25 mg by mouth in the morning and at bedtime., Disp: , Rfl:    leflunomide  (ARAVA ) 10 MG tablet, Take 10 mg by mouth daily., Disp: , Rfl:    Magnesium  Oxide -Mg Supplement 500 MG TABS, Take 500 mg by mouth daily., Disp: , Rfl:    nebivolol  (BYSTOLIC ) 10 MG tablet, Take 1 tablet (10 mg total) by mouth daily., Disp: 90 tablet, Rfl: 3   nitroGLYCERIN  (NITROSTAT ) 0.4 MG SL tablet, Place 1 tablet (0.4 mg total) under the tongue every 5 (five) minutes as needed for chest pain., Disp: 75 tablet, Rfl: 3   pravastatin  (PRAVACHOL ) 20 MG tablet, Take 1 tablet (20 mg total) by mouth every evening., Disp: 90 tablet, Rfl: 3   spironolactone  (ALDACTONE ) 25 MG tablet, Take 0.5 tablets (12.5 mg total) by mouth daily., Disp: 45 tablet, Rfl: 3   telmisartan  (MICARDIS ) 40 MG tablet, Take 1 tablet (40 mg total) by mouth daily., Disp: 90 tablet, Rfl: 3  terbinafine  (LAMISIL ) 1 % cream, Apply 1 Application topically 2 (two) times daily., Disp: , Rfl:    triamcinolone  ointment (KENALOG ) 0.5 %, Apply 1 Application topically 2 (two) times daily., Disp: 30 g, Rfl: 0   trospium  (SANCTURA ) 20 MG tablet, Take 20 mg by mouth 2 (two) times daily., Disp: , Rfl:    TURMERIC PO, Take 720 mg by mouth., Disp: , Rfl:    UNABLE TO FIND, Take 1,500 mg by mouth daily. Med Name: Glucogold, Disp: , Rfl:    UNABLE TO FIND, Take 1,200 mg by mouth in the  morning and at bedtime. Med Name: Benberine, Disp: , Rfl:    UNABLE TO FIND, Take 300 mg by mouth in the morning and at bedtime. Med Name: Dron protein plus, Disp: , Rfl:    VTAMA  1 % CREA, APPLY 1 APPLICATION TOPICALLY DAILY, Disp: 60 g, Rfl: 5  "

## 2024-05-30 NOTE — Telephone Encounter (Signed)
 FYI Only or Action Required?: FYI only for provider: appointment scheduled on 05/30/24.  Patient was last seen in primary care on 04/13/24.  Called Nurse Triage reporting Cough.  Symptoms began several days ago.  Interventions attempted: Nothing.  Symptoms are: gradually worsening.  Triage Disposition: See Physician Within 24 Hours  Patient/caregiver understands and will follow disposition?:   Reason for Disposition  [1] Continuous (nonstop) coughing interferes with work or school AND [2] no improvement using cough treatment per Care Advice  Answer Assessment - Initial Assessment Questions Patient's wife Andres calling. Pt states he had a severe non-productive cough, hacking, 99.9 temp, congestion, diffciulty breathing in, sinus pressure, runny nose.  Onset 05/28/24  Patient scheduled with pulmonology 05/30/24.   1. ONSET: When did the cough begin?      05/28/24 2. SEVERITY: How bad is the cough today?      Constant yesterday and not so bad today  3. SPUTUM: Describe the color of your sputum (e.g., none, dry cough; clear, white, yellow, green)     Denies  4. HEMOPTYSIS: Are you coughing up any blood? If Yes, ask: How much? (e.g., flecks, streaks, tablespoons, etc.)     Denies  5. DIFFICULTY BREATHING: Are you having difficulty breathing? If Yes, ask: How bad is it? (e.g., mild, moderate, severe)      Yes - difficulty with inspiration  6. FEVER: Do you have a fever? If Yes, ask: What is your temperature, how was it measured, and when did it start?     99.9 05/29/24  7. CARDIAC HISTORY: Do you have any history of heart disease? (e.g., heart attack, congestive heart failure)      Yes  8. LUNG HISTORY: Do you have any history of lung disease?  (e.g., pulmonary embolus, asthma, emphysema)     Yes  9. PE RISK FACTORS: Do you have a history of blood clots? (or: recent major surgery, recent prolonged travel, bedridden)     Denies  10. OTHER SYMPTOMS: Do you have any  other symptoms? (e.g., runny nose, wheezing, chest pain)       Denies  Protocols used: Cough - Acute Non-Productive-A-AH  Copied from CRM #[id number]. Topic: Clinical - Red Word Triage >> May 30, 2024  9:49 AM Isabell A wrote: Red Word that prompted transfer to Nurse Triage: SOB, wheezing, coughing, congestion

## 2024-05-30 NOTE — Addendum Note (Signed)
 Addended by: Zarahi Fuerst M on: 05/30/2024 02:27 PM   Modules accepted: Orders

## 2024-05-31 ENCOUNTER — Encounter: Payer: Self-pay | Admitting: Pharmacist

## 2024-05-31 ENCOUNTER — Encounter

## 2024-05-31 ENCOUNTER — Other Ambulatory Visit: Payer: Self-pay

## 2024-06-02 ENCOUNTER — Other Ambulatory Visit: Payer: Self-pay

## 2024-06-02 ENCOUNTER — Other Ambulatory Visit: Payer: Self-pay | Admitting: Cardiology

## 2024-06-02 ENCOUNTER — Other Ambulatory Visit (HOSPITAL_COMMUNITY): Payer: Self-pay

## 2024-06-08 ENCOUNTER — Telehealth: Payer: Self-pay | Admitting: Cardiology

## 2024-06-08 NOTE — Telephone Encounter (Signed)
 Was he having mild incontinence previously.  Is his dyspnea improved.  Does he feel any dysuria or burning sensation.

## 2024-06-08 NOTE — Telephone Encounter (Signed)
 Spoke with pt, since he started the jardiance , he has become incontinent. He reports he is now leaking between times that he goes to the bathroom. He reports that he does go a lot when he voids but then will leak. He does not have any rashes or other problems. Aware will send to dr ladona for recommendations.

## 2024-06-08 NOTE — Telephone Encounter (Signed)
 Pt c/o medication issue:  1. Name of Medication:   empagliflozin  (JARDIANCE ) 10 MG TABS tablet   2. How are you currently taking this medication (dosage and times per day)?   As prescribed  3. Are you having a reaction (difficulty breathing--STAT)?   4. What is your medication issue?   Patient stated this medication is making him incontinent.  Patient was advice on next steps.

## 2024-06-09 NOTE — Telephone Encounter (Signed)
 Left message to call back.

## 2024-06-28 ENCOUNTER — Other Ambulatory Visit: Payer: Self-pay

## 2024-06-30 ENCOUNTER — Encounter: Payer: Self-pay | Admitting: Oncology

## 2024-07-05 ENCOUNTER — Ambulatory Visit: Admitting: Internal Medicine

## 2024-07-21 ENCOUNTER — Encounter: Admitting: Dermatology

## 2024-08-01 ENCOUNTER — Encounter: Admitting: Dermatology

## 2024-08-02 ENCOUNTER — Ambulatory Visit: Admitting: Cardiology

## 2024-08-04 ENCOUNTER — Encounter

## 2024-08-15 ENCOUNTER — Ambulatory Visit: Admitting: Internal Medicine

## 2024-08-15 ENCOUNTER — Encounter

## 2024-11-03 ENCOUNTER — Encounter

## 2025-01-04 ENCOUNTER — Ambulatory Visit: Admitting: Urology

## 2025-02-02 ENCOUNTER — Encounter

## 2025-05-04 ENCOUNTER — Encounter
